# Patient Record
Sex: Male | Born: 1940 | Race: White | Hispanic: No | Marital: Married | Smoking: Never smoker
Health system: Southern US, Community
[De-identification: ages and names within clinical notes are randomized; demographics above are authoritative.]

## PROBLEM LIST (undated history)

## (undated) DIAGNOSIS — Z86718 Personal history of other venous thrombosis and embolism: Secondary | ICD-10-CM

## (undated) DIAGNOSIS — H3530 Unspecified macular degeneration: Secondary | ICD-10-CM

## (undated) DIAGNOSIS — D126 Benign neoplasm of colon, unspecified: Secondary | ICD-10-CM

## (undated) DIAGNOSIS — I10 Essential (primary) hypertension: Secondary | ICD-10-CM

## (undated) DIAGNOSIS — H269 Unspecified cataract: Secondary | ICD-10-CM

## (undated) DIAGNOSIS — E119 Type 2 diabetes mellitus without complications: Secondary | ICD-10-CM

## (undated) DIAGNOSIS — IMO0001 Reserved for inherently not codable concepts without codable children: Secondary | ICD-10-CM

## (undated) DIAGNOSIS — N182 Chronic kidney disease, stage 2 (mild): Secondary | ICD-10-CM

## (undated) DIAGNOSIS — Z85828 Personal history of other malignant neoplasm of skin: Secondary | ICD-10-CM

## (undated) DIAGNOSIS — M1990 Unspecified osteoarthritis, unspecified site: Secondary | ICD-10-CM

## (undated) DIAGNOSIS — E785 Hyperlipidemia, unspecified: Secondary | ICD-10-CM

## (undated) DIAGNOSIS — L97929 Non-pressure chronic ulcer of unspecified part of left lower leg with unspecified severity: Secondary | ICD-10-CM

## (undated) DIAGNOSIS — D689 Coagulation defect, unspecified: Secondary | ICD-10-CM

## (undated) DIAGNOSIS — Z87442 Personal history of urinary calculi: Secondary | ICD-10-CM

## (undated) DIAGNOSIS — C61 Malignant neoplasm of prostate: Secondary | ICD-10-CM

## (undated) DIAGNOSIS — Z9889 Other specified postprocedural states: Secondary | ICD-10-CM

## (undated) DIAGNOSIS — R55 Syncope and collapse: Secondary | ICD-10-CM

## (undated) DIAGNOSIS — R351 Nocturia: Secondary | ICD-10-CM

## (undated) DIAGNOSIS — I509 Heart failure, unspecified: Secondary | ICD-10-CM

## (undated) DIAGNOSIS — Z85820 Personal history of malignant melanoma of skin: Secondary | ICD-10-CM

## (undated) DIAGNOSIS — H9190 Unspecified hearing loss, unspecified ear: Secondary | ICD-10-CM

## (undated) DIAGNOSIS — C439 Malignant melanoma of skin, unspecified: Secondary | ICD-10-CM

## (undated) HISTORY — DX: Nocturia: R35.1

## (undated) HISTORY — DX: Unspecified cataract: H26.9

## (undated) HISTORY — PX: EYE SURGERY: SHX253

## (undated) HISTORY — DX: Essential (primary) hypertension: I10

## (undated) HISTORY — PX: TONSILLECTOMY: SUR1361

## (undated) HISTORY — PX: OTHER SURGICAL HISTORY: SHX169

## (undated) HISTORY — DX: Unspecified osteoarthritis, unspecified site: M19.90

## (undated) HISTORY — DX: Coagulation defect, unspecified: D68.9

## (undated) HISTORY — PX: PROSTATE BIOPSY: SHX241

## (undated) HISTORY — DX: Hyperlipidemia, unspecified: E78.5

## (undated) HISTORY — DX: Type 2 diabetes mellitus without complications: E11.9

## (undated) HISTORY — DX: Benign neoplasm of colon, unspecified: D12.6

## (undated) HISTORY — PX: POLYPECTOMY: SHX149

## (undated) HISTORY — PX: COLONOSCOPY: SHX174

## (undated) HISTORY — DX: Syncope and collapse: R55

## (undated) HISTORY — PX: JOINT REPLACEMENT: SHX530

## (undated) HISTORY — DX: Malignant melanoma of skin, unspecified: C43.9

---

## 1988-08-15 DIAGNOSIS — C439 Malignant melanoma of skin, unspecified: Secondary | ICD-10-CM

## 1988-08-15 HISTORY — PX: OTHER SURGICAL HISTORY: SHX169

## 1988-08-15 HISTORY — DX: Malignant melanoma of skin, unspecified: C43.9

## 2009-01-29 ENCOUNTER — Ambulatory Visit: Payer: Self-pay | Admitting: Internal Medicine

## 2009-02-17 ENCOUNTER — Ambulatory Visit: Payer: Self-pay | Admitting: Internal Medicine

## 2009-02-18 ENCOUNTER — Encounter: Payer: Self-pay | Admitting: Internal Medicine

## 2009-08-15 DIAGNOSIS — C4491 Basal cell carcinoma of skin, unspecified: Secondary | ICD-10-CM

## 2009-08-15 DIAGNOSIS — Z85828 Personal history of other malignant neoplasm of skin: Secondary | ICD-10-CM

## 2009-08-15 HISTORY — DX: Basal cell carcinoma of skin, unspecified: C44.91

## 2009-08-15 HISTORY — DX: Personal history of other malignant neoplasm of skin: Z85.828

## 2012-03-27 ENCOUNTER — Encounter: Payer: Self-pay | Admitting: Internal Medicine

## 2012-04-12 ENCOUNTER — Encounter: Payer: Self-pay | Admitting: Internal Medicine

## 2012-05-29 ENCOUNTER — Ambulatory Visit (AMBULATORY_SURGERY_CENTER): Payer: PRIVATE HEALTH INSURANCE | Admitting: *Deleted

## 2012-05-29 VITALS — Ht 67.0 in | Wt 245.0 lb

## 2012-05-29 DIAGNOSIS — Z1211 Encounter for screening for malignant neoplasm of colon: Secondary | ICD-10-CM

## 2012-05-29 MED ORDER — MOVIPREP 100 G PO SOLR: ORAL | Status: DC

## 2012-06-12 ENCOUNTER — Encounter: Payer: Self-pay | Admitting: Internal Medicine

## 2012-06-12 ENCOUNTER — Ambulatory Visit (AMBULATORY_SURGERY_CENTER): Payer: PRIVATE HEALTH INSURANCE | Admitting: Internal Medicine

## 2012-06-12 VITALS — BP 126/71 | HR 78 | Temp 97.9°F | Resp 16 | Ht 67.0 in | Wt 245.0 lb

## 2012-06-12 DIAGNOSIS — D126 Benign neoplasm of colon, unspecified: Secondary | ICD-10-CM

## 2012-06-12 DIAGNOSIS — Z1211 Encounter for screening for malignant neoplasm of colon: Secondary | ICD-10-CM

## 2012-06-12 DIAGNOSIS — Z86010 Personal history of colonic polyps: Secondary | ICD-10-CM

## 2012-06-12 MED ORDER — SODIUM CHLORIDE 0.9 % IV SOLN: 500.0000 mL | INTRAVENOUS | Status: DC

## 2012-06-12 NOTE — Progress Notes (Signed)
Patient did not experience any of the following events: a burn prior to discharge; a fall within the facility; wrong site/side/patient/procedure/implant event; or a hospital transfer or hospital admission upon discharge from the facility. (G8907) Patient did not have preoperative order for IV antibiotic SSI prophylaxis. (G8918)  

## 2012-06-12 NOTE — Patient Instructions (Signed)
YOU HAD AN ENDOSCOPIC PROCEDURE TODAY AT THE [facility]: Refer to the procedure report that was given to you for any specific questions about what was found during the examination.  If the procedure report does not answer your questions, please call your gastroenterologist to clarify.  If you requested that your care partner not be given the details of your procedure findings, then the procedure report has been included in a sealed envelope for you to review at your convenience later.  YOU SHOULD EXPECT: Some feelings of bloating in the abdomen. Passage of more gas than usual.  Walking can help get rid of the air that was put into your GI tract during the procedure and reduce the bloating. If you had a lower endoscopy (such as a colonoscopy or flexible sigmoidoscopy) you may notice spotting of blood in your stool or on the toilet paper. If you underwent a bowel prep for your procedure, then you may not have a normal bowel movement for a few days.  DIET: Your first meal following the procedure should be a light meal and then it is ok to progress to your normal diet.  A half-sandwich or bowl of soup is an example of a good first meal.  Heavy or fried foods are harder to digest and may make you feel nauseous or bloated.  Likewise meals heavy in dairy and vegetables can cause extra gas to form and this can also increase the bloating.  Drink plenty of fluids but you should avoid alcoholic beverages for 24 hours.  ACTIVITY: Your care partner should take you home directly after the procedure.  You should plan to take it easy, moving slowly for the rest of the day.  You can resume normal activity the day after the procedure however you should NOT DRIVE or use heavy machinery for 24 hours (because of the sedation medicines used during the test).    SYMPTOMS TO REPORT IMMEDIATELY: A gastroenterologist can be reached at any hour.  During normal business hours, 8:30 AM to 5:00 PM Monday through Friday,  call [phone].  After hours and on weekends, please call the GI answering service at [phone] who will take a message and have the physician on call contact you.   Following lower endoscopy (colonoscopy or flexible sigmoidoscopy):  Excessive amounts of blood in the stool  Significant tenderness or worsening of abdominal pains  Swelling of the abdomen that is new, acute  Fever of 100F or higher    FOLLOW UP: If any biopsies were taken you will be contacted by phone or by letter within the next 1-3 weeks.  Call your gastroenterologist if you have not heard about the biopsies in 3 weeks.  Our staff will call the home number listed on your records the next business day following your procedure to check on you and address any questions or concerns that you may have at that time regarding the information given to you following your procedure. This is a courtesy call and so if there is no answer at the home number and we have not heard from you through the emergency physician on call, we will assume that you have returned to your regular daily activities without incident.  SIGNATURES/CONFIDENTIALITY: You and/or your care partner have signed paperwork which will be entered into your electronic medical record.  These signatures attest to the fact that that the information above on your After Visit Summary has been reviewed and is understood.  Full responsibility of the confidentiality   of this discharge information lies with you and/or your care-partner.     

## 2012-06-12 NOTE — Op Note (Signed)
[place]  [address]. [place], [address]   COLONOSCOPY PROCEDURE REPORT  PATIENT: Ricardo Mayer, Ricardo Mayer  MR#: [id number] BIRTHDATE: 1941-04-19 , 71  yrs. 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 MEDICATIONS: MAC sedation, administered by CRNA and propofol (Diprivan) 150mg  IV  DESCRIPTION OF PROCEDURE:   After the risks benefits and alternatives of the procedure were thoroughly explained, informed consent was obtained.  A digital rectal exam revealed no abnormalities of the rectum.   The LB CF-[id number]  endoscope was introduced through the anus and advanced to the cecum, which was identified by both the appendix and ileocecal valve. No adverse events experienced.   The quality of the prep was good, using MoviPrep  The instrument was then slowly withdrawn as the colon was fully examined.      COLON FINDINGS: A diminutive polyp was found in the ascending colon. A polypectomy was performed with a cold snare.  The resection was complete and the polyp tissue was completely retrieved.   Mild diverticulosis was noted in the sigmoid colon.   The colon mucosa was otherwise normal.  Retroflexed views revealed internal hemorrhoids. The time to cecum=2 minutes 05 seconds.  Withdrawal time=9 minutes 37 seconds.  The scope was withdrawn and the procedure completed. COMPLICATIONS: There were no complications.  ENDOSCOPIC IMPRESSION: 1.   Diminutive polyp was found in the ascending colon; polypectomy was performed with a cold snare 2.   Mild diverticulosis was noted in the sigmoid colon 3.   The colon mucosa was otherwise normal  RECOMMENDATIONS: 1. Follow up colonoscopy in 5 years   eSigned:  Eustace Quail, MD 06/12/2012 9:46 AM   cc: [name], MD and The Patient   PATIENT NAME:  Ricardo Mayer, Ricardo Mayer MR#: [id number]

## 2012-06-13 ENCOUNTER — Telehealth: Payer: Self-pay

## 2012-06-13 NOTE — Telephone Encounter (Signed)
  Follow up Call-  Call back number 06/12/2012  Post procedure Call Back phone  # [phone]  Permission to leave phone message Yes     Patient questions:  Do you have a fever, pain , or abdominal swelling? no Pain Score  0 *  Have you tolerated food without any problems? yes  Have you been able to return to your normal activities? yes  Do you have any questions about your discharge instructions: Diet   no Medications  no Follow up visit  no  Do you have questions or concerns about your Care? no  Actions: * If pain score is 4 or above: No action needed, pain <4.  The pt was at work.  His wife said he did fine. Maw

## 2012-06-19 ENCOUNTER — Encounter: Payer: Self-pay | Admitting: Internal Medicine

## 2012-08-15 DIAGNOSIS — C61 Malignant neoplasm of prostate: Secondary | ICD-10-CM

## 2012-08-15 HISTORY — DX: Malignant neoplasm of prostate: C61

## 2012-11-12 ENCOUNTER — Other Ambulatory Visit: Payer: Self-pay | Admitting: Family Medicine

## 2012-12-18 ENCOUNTER — Other Ambulatory Visit: Payer: Self-pay | Admitting: *Deleted

## 2012-12-18 ENCOUNTER — Telehealth: Payer: Self-pay | Admitting: Family Medicine

## 2012-12-18 MED ORDER — AMLODIPINE BESYLATE-VALSARTAN 10-320 MG PO TABS: 1.0000 | ORAL_TABLET | Freq: Every day | ORAL | Status: DC

## 2012-12-19 NOTE — Telephone Encounter (Signed)
Please advise have you seen anything on this patient?

## 2012-12-19 NOTE — Telephone Encounter (Signed)
Done on 12-18-12

## 2012-12-25 ENCOUNTER — Encounter: Payer: Self-pay | Admitting: Radiation Oncology

## 2012-12-25 DIAGNOSIS — C61 Malignant neoplasm of prostate: Secondary | ICD-10-CM | POA: Insufficient documentation

## 2012-12-25 NOTE — Progress Notes (Signed)
GU Location of Tumor / Histology: prostate  If Prostate Cancer, Gleason Score is (3 + 3=6) and PSA is (5.04)  Patient presented 2010 with elevated PSA.  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  Past/Anticipated interventions by urology, if any: Follow up in 3 months with PSA  Past/Anticipated interventions by medical oncology, if any: None  Weight changes, if any: None noted  Bowel/Bladder complaints, if any: frequency, urgency, nocturia, gross hematuria   Nausea/Vomiting, if any: None noted  Pain issues, if any:  None noted  SAFETY ISSUES:  Prior radiation? NO  Pacemaker/ICD? NO  Possible current pregnancy? NO  Is the patient on methotrexate? NO  Current Complaints / other details:  72 year old male. Married. 3 sons. Immunologist. Prostate volume 54 cc. 2+ prostate size. NKDA.   November 2010 PSA 3.18 February 2012    PSA 4.15 November 2012     PSA 5.04

## 2012-12-26 ENCOUNTER — Ambulatory Visit
Admission: RE | Admit: 2012-12-26 | Discharge: 2012-12-26 | Disposition: A | Discharge status: Home / Self Care | Payer: PRIVATE HEALTH INSURANCE | Source: Ambulatory Visit | Attending: Radiation Oncology | Admitting: Radiation Oncology

## 2012-12-26 ENCOUNTER — Encounter: Payer: Self-pay | Admitting: Radiation Oncology

## 2012-12-26 VITALS — BP 132/73 | HR 78 | Temp 97.5°F | Resp 18 | Ht 67.0 in | Wt 254.8 lb

## 2012-12-26 DIAGNOSIS — C61 Malignant neoplasm of prostate: Secondary | ICD-10-CM

## 2012-12-26 DIAGNOSIS — I129 Hypertensive chronic kidney disease with stage 1 through stage 4 chronic kidney disease, or unspecified chronic kidney disease: Secondary | ICD-10-CM | POA: Insufficient documentation

## 2012-12-26 DIAGNOSIS — E785 Hyperlipidemia, unspecified: Secondary | ICD-10-CM | POA: Insufficient documentation

## 2012-12-26 DIAGNOSIS — Z800 Family history of malignant neoplasm of digestive organs: Secondary | ICD-10-CM | POA: Insufficient documentation

## 2012-12-26 DIAGNOSIS — Z79899 Other long term (current) drug therapy: Secondary | ICD-10-CM | POA: Insufficient documentation

## 2012-12-26 DIAGNOSIS — Z803 Family history of malignant neoplasm of breast: Secondary | ICD-10-CM | POA: Insufficient documentation

## 2012-12-26 DIAGNOSIS — N189 Chronic kidney disease, unspecified: Secondary | ICD-10-CM | POA: Insufficient documentation

## 2012-12-26 DIAGNOSIS — Z801 Family history of malignant neoplasm of trachea, bronchus and lung: Secondary | ICD-10-CM | POA: Insufficient documentation

## 2012-12-26 DIAGNOSIS — Z7982 Long term (current) use of aspirin: Secondary | ICD-10-CM | POA: Insufficient documentation

## 2012-12-26 HISTORY — DX: Malignant neoplasm of prostate: C61

## 2012-12-26 NOTE — Progress Notes (Signed)
Patient presents to the clinic today accompanied by his wife for consultation with Dr. Tammi Klippel to discuss the role of radiation therapy in the treatment of prostate cancer. Patient alert and oriented to person, place, and time. No distress noted. Steady gait noted. Pleasant affect noted. Patient reports on average he gets up three times during the night to void. Patient reports on average he voids every two hours. Patient denies urgency. Patient reports a strong urine stream. Patient reports that he is confident he empties his bladder completely while void. Patient denies hematuria. Patient denies burning with urination. Patient denies diarrhea or painful bowel movements. Patient denies nausea, vomiting, headache or dizziness. Patient denies unintentional weight loss. Patient denies night sweats. Reported all findings to Dr. Tammi Klippel.        Complete PATIENT MEASURE OF DISTRESS worksheet with a score of 2 submitted to social work.

## 2012-12-26 NOTE — Progress Notes (Signed)
Opened in error

## 2012-12-26 NOTE — Progress Notes (Signed)
See progress note under physician encounter. 

## 2012-12-28 ENCOUNTER — Encounter: Payer: Self-pay | Admitting: Radiation Oncology

## 2012-12-28 NOTE — Progress Notes (Signed)
Radiation Oncology         [phone] ________________________________  Initial outpatient Consultation  Name: Ricardo Mayer MRN: [id number]  Date: 12/26/2012  DOB: 22-Oct-1940  [id number], Elenore Rota, MD  Malka So, MD   REFERRING PHYSICIAN: Malka So, MD  DIAGNOSIS: 72 y.o. 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 gentleman.  He was noted to have an elevated PSA of 4.3 by his primary care physician, Dr. Laurance Flatten.  Accordingly, he was referred for evaluation in urology by Dr. Jeffie Pollock on 05/22/12,  digital rectal examination was performed at that time revealing a 2+ gland without distinct nodularity. The patient was setup for transrectal ultrasound with biopsies but developed a DVT. After a delay for anticoagulation, the patient proceeded to transrectal ultrasound with 12 biopsies of the prostate on 11/20/2012.  The prostate volume measured 54 cc.  Out of 12 core biopsies, 5 were positive.  The maximum Gleason score was 3+3, and this was seen in 60% of the right lateral mid, 20% of the right mid, 30% of the right lateral apex, 40% of the right apex, and 70% of the left lateral base.  The patient reviewed the biopsy results with his urologist and he has kindly been referred today for discussion of potential radiation treatment options.  PREVIOUS RADIATION THERAPY: No  PAST MEDICAL HISTORY:  has a past medical history of Hypertension; Hyperlipidemia; Adenomatous polyp of colon; Chronic renal insufficiency; Urgency of urination; Gross hematuria; Personal history of urinary calculi; Nocturia; Urinary frequency; Slowing of urinary stream; Splitting of urinary stream; Prostate cancer; Arthritis; Cataract; Cancer; and DVT (deep venous thrombosis) (2011).    PAST SURGICAL HISTORY: Past Surgical History  Procedure Laterality Date  . Kidney surgery Left 1960  . Kidney stones    .  Prostate biopsy    . Melanoma excision Right 1990    right arm    FAMILY HISTORY: family history includes Breast cancer in his mother; Cancer in his paternal grandfather and paternal uncle; Esophageal cancer in his brother; and Lung cancer in his father.  SOCIAL HISTORY:  reports that he has never smoked. He has never used smokeless tobacco. He reports that he does not drink alcohol or use illicit drugs.  ALLERGIES: Review of patient's allergies indicates no known allergies.  MEDICATIONS:  Current Outpatient Prescriptions  Medication Sig Dispense Refill  . amLODipine-valsartan (EXFORGE) 10-320 MG per tablet Take 1 tablet by mouth daily.  30 tablet  1  . aspirin 81 MG tablet Take 81 mg by mouth daily.      . cholecalciferol (VITAMIN D) 1000 UNITS tablet Take 1,000 Units by mouth daily.      . fenofibrate 160 MG tablet TAKE 1 TABLET BY MOUTH DAILY  30 tablet  4  . ONE TOUCH ULTRA TEST test strip       . furosemide (LASIX) 20 MG tablet Take 1 tablet by mouth Daily.      Marland Kitchen levofloxacin (LEVAQUIN) 500 MG tablet        No current facility-administered medications for this encounter.    REVIEW OF SYSTEMS:  A 15 point review of systems is documented in the electronic medical record. This was obtained by the nursing staff. However, I reviewed this with the patient to discuss relevant findings and make appropriate changes.  A comprehensive review of systems was negative..  The patient completed an  IPSS and IIEF questionnaire.  His IPSS score was 8 indicating moderate urinary outflow obstructive symptoms.  He indicated that his erectile function is able to complete sexual activity almost never.   PHYSICAL EXAM: This patient is in no acute distress.  He is alert and oriented.   height is 5\' 7"  (1.702 m) and weight is 254 lb 12.8 oz (115.577 kg). His oral temperature is 97.5 F (36.4 C). His blood pressure is 132/73 and his pulse is 78. His respiration is 18 and oxygen saturation is 100%.  He exhibits  no respiratory distress or labored breathing.  He appears neurologically intact.  His mood is pleasant.  His affect is appropriate.  Please note the digital rectal exam findings described above.    IMPRESSION: This gentleman is a 72 y.o. 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  His T-Stage, Gleason's Score, and PSA put him into the favorable risk group.  Accordingly he is eligible for a variety of potential treatment options including active surveillance, radical prostatectomy, intensity modulated radiotherapy, and prostate seed implant.  PLAN: Today I reviewed the findings and workup thus far.  We discussed the natural history of prostate cancer.  We reviewed the the implications of T-stage, Gleason's Score, and PSA on decision-making and outcomes in prostate cancer.  We discussed radiation treatment in the management of prostate cancer with regard to the logistics and delivery of external beam radiation treatment as well as the logistics and delivery of prostate brachytherapy.  We compared and contrasted each of these approaches and also compared these against prostatectomy.  The patient expressed interest in prostate brachytherapy.  I filled out a patient counseling form for him with relevant treatment diagrams and we retained a copy for our records.   The patient would like to proceed with prostate brachytherapy.  I will share my findings with Dr. Jeffie Pollock and move forward with scheduling the procedure.  The patient will have visiting grandchildren and a pregnant daughter during the early summer months. He would like to schedule his prostate seed implant to occur during the final week of July or first week of August.  I enjoyed meeting with him today, and will look forward to participating in the care of this very nice gentleman.   I spent 60 minutes face to face with the patient and more than 50% of that time was spent in counseling and/or  coordination of care.   ------------------------------------------------  Sheral Apley. Tammi Klippel, M.D.

## 2013-01-01 ENCOUNTER — Telehealth: Payer: Self-pay | Admitting: *Deleted

## 2013-01-01 NOTE — Telephone Encounter (Signed)
Called patient to ask a question, lvm for a return call

## 2013-01-03 ENCOUNTER — Telehealth: Payer: Self-pay | Admitting: *Deleted

## 2013-01-03 NOTE — Telephone Encounter (Signed)
Called patient to inform of implant date, lvm for a return call 

## 2013-02-05 ENCOUNTER — Ambulatory Visit (INDEPENDENT_AMBULATORY_CARE_PROVIDER_SITE_OTHER): Payer: PRIVATE HEALTH INSURANCE | Admitting: Physician Assistant

## 2013-02-05 VITALS — BP 123/66 | HR 80 | Temp 96.8°F | Ht 67.0 in | Wt 249.0 lb

## 2013-02-05 DIAGNOSIS — S81802A Unspecified open wound, left lower leg, initial encounter: Secondary | ICD-10-CM

## 2013-02-05 DIAGNOSIS — I872 Venous insufficiency (chronic) (peripheral): Secondary | ICD-10-CM

## 2013-02-05 DIAGNOSIS — S91009A Unspecified open wound, unspecified ankle, initial encounter: Secondary | ICD-10-CM

## 2013-02-05 DIAGNOSIS — S81002A Unspecified open wound, left knee, initial encounter: Secondary | ICD-10-CM

## 2013-02-05 DIAGNOSIS — S81809A Unspecified open wound, unspecified lower leg, initial encounter: Secondary | ICD-10-CM | POA: Diagnosis not present

## 2013-02-05 NOTE — Patient Instructions (Signed)
TAKE FLUID PILL AS DIRECTED TO PREVENT SWELLING. WEAR COMPRESSION STOCKING ON RIGHT LEG UNTIL DIRECTED OTHERWISE TO PREVENT SWELLING. ELEVATE LEGS. DO NOT REMOVE DRESSING UNTIL FOLLOW UP APPT ON TUES February 12, 2013.

## 2013-02-05 NOTE — Progress Notes (Signed)
  Subjective:    Patient ID: Ricardo Mayer, male    DOB: 09-22-1940, 72 y.o.   MRN: [id number]  HPI 72 Y/O male with hx of venous insufficiency presents with 2 open wounds to LLE below the knee x 1 wk. He states that the wounds have been draining and that he has been having increased swelling in BLE.     Review of Systems  Constitutional: Negative for fever, chills, diaphoresis, activity change, fatigue and unexpected weight change.  Cardiovascular: Positive for leg swelling (bilateral, more on LLE). Negative for chest pain and palpitations.  Skin: Positive for color change (erythema surrounding ulcerations on LLE, in addition to iron staiining on BLE) and wound (2 ulcerative lesions on LLE below the knee). Negative for pallor and rash.       Objective:   Physical Exam  Constitutional: He appears well-developed and well-nourished.  Skin: Lesion (ulcerative lesions with surrounding erythema, iron staining on BLE) noted. There is erythema.             Assessment & Plan:  An unna wrap was applied to LLE from knee down to cover ulcerations. Patient will RTC next Tuesday for dressing change. Instructed to take Lasix as directed on a daily basis until symptoms resolve. Also suggested wearing compression stockings to relieve swelling. Bacterial culture was taken. Await culture & sensitivity and treat accordingly.

## 2013-02-08 ENCOUNTER — Encounter: Payer: Self-pay | Admitting: Radiation Oncology

## 2013-02-08 ENCOUNTER — Ambulatory Visit (HOSPITAL_COMMUNITY)
Admission: RE | Admit: 2013-02-08 | Discharge: 2013-02-08 | Disposition: A | Discharge status: Home / Self Care | Payer: PRIVATE HEALTH INSURANCE | Source: Ambulatory Visit | Attending: Urology | Admitting: Urology

## 2013-02-08 ENCOUNTER — Ambulatory Visit
Admission: RE | Admit: 2013-02-08 | Discharge: 2013-02-08 | Disposition: A | Discharge status: Home / Self Care | Payer: PRIVATE HEALTH INSURANCE | Source: Ambulatory Visit | Attending: Radiation Oncology | Admitting: Radiation Oncology

## 2013-02-08 ENCOUNTER — Encounter (HOSPITAL_BASED_OUTPATIENT_CLINIC_OR_DEPARTMENT_OTHER)
Admission: RE | Admit: 2013-02-08 | Discharge: 2013-02-08 | Disposition: A | Discharge status: Home / Self Care | Payer: PRIVATE HEALTH INSURANCE | Source: Ambulatory Visit | Attending: Urology | Admitting: Urology

## 2013-02-08 DIAGNOSIS — I10 Essential (primary) hypertension: Secondary | ICD-10-CM | POA: Insufficient documentation

## 2013-02-08 DIAGNOSIS — C61 Malignant neoplasm of prostate: Secondary | ICD-10-CM | POA: Diagnosis present

## 2013-02-08 DIAGNOSIS — Z01818 Encounter for other preprocedural examination: Secondary | ICD-10-CM | POA: Insufficient documentation

## 2013-02-08 LAB — WOUND CULTURE

## 2013-02-08 NOTE — Progress Notes (Signed)
  Radiation Oncology         [phone] ________________________________  Name: ETHANJACOB POLIS MRN: [id number]  Date: 02/08/2013  DOB: [birth year]  SIMULATION AND TREATMENT PLANNING NOTE PUBIC ARCH STUDY  [id number], Elenore Rota, MD  Malka So, MD  DIAGNOSIS: 73 y.o. 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 He was brought to the radiation planning suite and placed supine on the CT couch. A 3-dimensional image study set was obtained in upload to the planning computer. There, on each axial slice, I contoured the prostate gland. Then, using three-dimensional radiation planning tools I reconstructed the prostate in view of the structures from the transperineal needle pathway to assess for possible pubic arch interference. In doing so, I did not appreciate any pubic arch interference. Also, the patient's prostate volume was estimated based on the drawn structure. The volume was 55 cc.  Given the pubic arch appearance and prostate volume, patient remains a good candidate to proceed with prostate seed implant. Today, he freely provided informed written consent to proceed.    PLAN: The patient will undergo prostate seed implant.   ________________________________  Sheral Apley. Tammi Klippel, M.D.

## 2013-02-09 ENCOUNTER — Telehealth: Payer: Self-pay | Admitting: Oncology

## 2013-02-09 NOTE — Telephone Encounter (Signed)
Called Ricardo Mayer to make an appointment to see Dr. Tammi Klippel to sign consent for his prostate seed implant.  Per [name], he can come in at 11:30 on Monday, 02/11/2013.  Appointment was make in epic for 11:30 as a nurse eval.

## 2013-02-11 ENCOUNTER — Ambulatory Visit
Admission: RE | Admit: 2013-02-11 | Discharge: 2013-02-11 | Disposition: A | Discharge status: Home / Self Care | Payer: PRIVATE HEALTH INSURANCE | Source: Ambulatory Visit | Attending: Radiation Oncology | Admitting: Radiation Oncology

## 2013-02-12 ENCOUNTER — Ambulatory Visit (INDEPENDENT_AMBULATORY_CARE_PROVIDER_SITE_OTHER): Payer: PRIVATE HEALTH INSURANCE | Admitting: Family Medicine

## 2013-02-12 ENCOUNTER — Encounter: Payer: Self-pay | Admitting: Family Medicine

## 2013-02-12 VITALS — BP 117/70 | HR 76 | Temp 97.0°F | Ht 67.0 in | Wt 249.2 lb

## 2013-02-12 DIAGNOSIS — L0390 Cellulitis, unspecified: Secondary | ICD-10-CM | POA: Diagnosis not present

## 2013-02-12 DIAGNOSIS — L0291 Cutaneous abscess, unspecified: Secondary | ICD-10-CM

## 2013-02-12 MED ORDER — DOXYCYCLINE HYCLATE 100 MG PO TABS: 100.0000 mg | ORAL_TABLET | Freq: Two times a day (BID) | ORAL | Status: DC

## 2013-02-12 NOTE — Progress Notes (Signed)
  Subjective:    Patient ID: Ricardo Mayer, male    DOB: 11/14/1940, 72 y.o.   MRN: [id number]  HPI This 72 y.o. male presents for evaluation of left lower extremity ulcers.  He has venous insufficiency and get venous stasis wounds on occasion.  He has been wearing Unnas boot to his left lower extremity for a week.   Review of Systems    No chest pain, SOB, HA, dizziness, vision change, N/V, diarrhea, constipation, dysuria, urinary urgency or frequency, myalgias, arthralgias or rash.  Objective:   Physical Exam  Vital signs noted  Well developed well nourished male.  HEENT - Head atraumatic Normocephalic                Throat - oropharanx wnl Respiratory - Lungs CTA bilateral Cardiac - RRR S1 and S2 without murmur  Extremities - Left lower extremiy with healed ulcer left medial ankle and yellow exudate scab over left anterior tibial region.       Assessment & Plan:  Cellulitis - Plan: doxycycline (VIBRA-TABS) 100 MG tablet, DC unnas boot left lower extremity. Edema - Continue to use lasix for swelling and use venous compression stockings.

## 2013-02-12 NOTE — Patient Instructions (Signed)
Venous Stasis and Chronic Venous Insufficiency  As people age, the veins located in their legs may weaken and stretch. When veins weaken and lose the ability to pump blood effectively, the condition is called chronic venous insufficiency (CVI) or venous stasis. Almost all veins return blood back to the heart. This happens by:   The force of the heart pumping fresh blood pushes blood back to the heart.   Blood flowing to the heart from the force of gravity.  In the deep veins of the legs, blood has to fight gravity and flow upstream back to the heart. Here, the leg muscles contract to pump blood back toward the heart. Vein walls are elastic, and many veins have small valves that only allow blood to flow in one direction. When leg muscles contract, they push inward against the elastic vein walls. This squeezes blood upward, opens the valves, and moves blood toward the heart. When leg muscles relax, the vein wall also relaxes and the valves inside the vein close to prevent blood from flowing backward. This method of pumping blood out of the legs is called the venous pump.  CAUSES   The venous pump works best while walking and leg muscles are contracting. But when a person sits or stands, blood pressure in leg veins can build. Deep veins are usually able to withstand short periods of inactivity, but long periods of inactivity (and increased pressure) can stretch, weaken, and damage vein walls. High blood pressure can also stretch and damage vein walls. The veins may no longer be able to pump blood back to the heart. Venous hypertension (high blood pressure inside veins) that lasts over time is a primary cause of CVI. CVI can also be caused by:    Deep vein thrombosis, a condition where a thrombus (blood clot) blocks blood flow in a vein.   Phlebitis, an inflammation of a superficial vein that causes a blood clot to form.  Other risk factors for CVI may include:    Heredity.   Obesity.   Pregnancy.    Sedentary lifestyle.   Smoking.   Jobs requiring long periods of standing or sitting in one place.   Age and gender:   Women in their 40's and 50's and men in their 70's are more prone to developing CVI.  SYMPTOMS   Symptoms of CVI may include:    Varicose veins.   Ulceration or skin breakdown.   Lipodermatosclerosis, a condition that affects the skin just above the ankle, usually on the inside surface. Over time the skin becomes brown, smooth, tight and often painful. Those with this condition have a high risk of developing skin ulcers.   Reddened or discolored skin on the leg.   Swelling.  DIAGNOSIS   Your caregiver can diagnose CVI after performing a careful medical history and physical examination. To confirm the diagnosis, the following tests may also be ordered:    Duplex ultrasound.   Plethysmography (tests blood flow).   Venograms (x-ray using a special dye).  TREATMENT  The goals of treatment for CVI are to restore a person to an active life and to minimize pain or disability. Typically, CVI does not pose a serious threat to life or limb, and with proper treatment most people with this condition can continue to lead active lives. In most cases, mild CVI can be treated on an outpatient basis with simple procedures. Treatment methods include:    Elastic compression socks.   Sclerotherapy, a procedure involving an injection of   a material that "dissolves" the damaged veins. Other veins in the network of blood vessels take over the function of the damaged veins.   Vein stripping (an older procedure less commonly used).   Laser Ablation surgery.   Valve repair.  HOME CARE INSTRUCTIONS    Elastic compression socks must be worn every day. They can help with symptoms and lower the chances of the problem getting worse, but they do not cure the problem.   Only take over-the-counter or prescription medicines for pain, discomfort, or fever as directed by your caregiver.    Your caregiver will review your other medications with you.  SEEK MEDICAL CARE IF:    You are confused about how to take your medications.   There is redness, swelling, or increasing pain in the affected area.   There is a red streak or line that extends up or down from the affected area.   There is a breakdown or loss of skin in the affected area, even if the breakdown is small.   You develop an unexplained oral temperature above 102 F (38.9 C).   There is an injury to the affected area.  SEEK IMMEDIATE MEDICAL CARE IF:    There is an injury and open wound to the affected area.   Pain is not adequately relieved with pain medication prescribed or becomes severe.   An oral temperature above 102 F (38.9 C) develops.   The foot/ankle below the affected area becomes suddenly numb or the area feels weak and hard to move.  MAKE SURE YOU:    Understand these instructions.   Will watch your condition.   Will get help right away if you are not doing well or get worse.  Document Released: 12/05/2006 Document Revised: 10/24/2011 Document Reviewed: 02/12/2007  ExitCare Patient Information 2014 [employer].

## 2013-02-21 ENCOUNTER — Other Ambulatory Visit: Payer: Self-pay | Admitting: Family Medicine

## 2013-02-22 ENCOUNTER — Other Ambulatory Visit: Payer: Self-pay | Admitting: Family Medicine

## 2013-03-05 ENCOUNTER — Encounter: Payer: Self-pay | Admitting: Family Medicine

## 2013-03-05 ENCOUNTER — Ambulatory Visit (INDEPENDENT_AMBULATORY_CARE_PROVIDER_SITE_OTHER): Payer: PRIVATE HEALTH INSURANCE | Admitting: Family Medicine

## 2013-03-05 VITALS — BP 124/78 | HR 79 | Temp 97.2°F | Ht 67.0 in | Wt 252.0 lb

## 2013-03-05 DIAGNOSIS — I10 Essential (primary) hypertension: Secondary | ICD-10-CM

## 2013-03-05 DIAGNOSIS — L97909 Non-pressure chronic ulcer of unspecified part of unspecified lower leg with unspecified severity: Secondary | ICD-10-CM | POA: Diagnosis not present

## 2013-03-05 DIAGNOSIS — I83009 Varicose veins of unspecified lower extremity with ulcer of unspecified site: Secondary | ICD-10-CM | POA: Diagnosis not present

## 2013-03-05 DIAGNOSIS — IMO0001 Reserved for inherently not codable concepts without codable children: Secondary | ICD-10-CM

## 2013-03-05 LAB — COMPREHENSIVE METABOLIC PANEL
CO2: 24 mEq/L (ref 19–32)
Calcium: 9.9 mg/dL (ref 8.4–10.5)
Glucose, Bld: 109 mg/dL — ABNORMAL HIGH (ref 70–99)
Sodium: 136 mEq/L (ref 135–145)
Total Bilirubin: 0.5 mg/dL (ref 0.3–1.2)
Total Protein: 6.4 g/dL (ref 6.0–8.3)

## 2013-03-05 LAB — LDL CHOLESTEROL, DIRECT: Direct LDL: 21 mg/dL

## 2013-03-05 NOTE — Progress Notes (Signed)
  Subjective:    Patient ID: Ricardo Mayer, male    DOB: March 13, 1941, 72 y.o.   MRN: [id number]  HPI Pt presents today for follow up of chronic venous ulcers.  Have been present for at least the last year since DVT in 2011.  No longer on coumading.  Not a diabetic to his knowledge.  No hx/o heart failure.  Was seen on 6/24 for sxs.  Unna boot placed.  Kept on fo 1 week and sxs improved.  Sxs recurred after unna boot was removed.  Mild weeping. No purulence.  No fevers or chills. No trauma.     Review of Systems  All other systems reviewed and are negative.       Objective:   Physical Exam  Constitutional:  Obese    HENT:  Head: Normocephalic and atraumatic.  Eyes: Conjunctivae are normal. Pupils are equal, round, and reactive to light.  Neck: Normal range of motion.  Cardiovascular: Normal rate and regular rhythm.   Pulmonary/Chest: Effort normal.  Abdominal: Soft.  Musculoskeletal:  1-2 + edema in  LLE  Mild tenderness  2+ peripheral pulses   Neurological: He is alert.  Skin: Rash noted.          Assessment & Plan:  Venous stasis ulcers, left - Plan: Vas Lab Arterial/Venous, POCT glycosylated hemoglobin (Hb A1C)  HTN (hypertension) - Plan: Comprehensive metabolic panel, TSH, LDL Cholesterol, Direct, POCT glycosylated hemoglobin (Hb A1C)  Fairly mild venous stasis ulcer.  Unna boot placed to affected LE PLan for wound care follow up Check ABIs Also check baseline blood work including A1c, CMET

## 2013-03-06 ENCOUNTER — Other Ambulatory Visit: Payer: Self-pay

## 2013-03-06 DIAGNOSIS — I872 Venous insufficiency (chronic) (peripheral): Secondary | ICD-10-CM

## 2013-03-06 LAB — TSH: TSH: 2.035 u[IU]/mL (ref 0.350–4.500)

## 2013-03-13 ENCOUNTER — Telehealth: Payer: Self-pay | Admitting: *Deleted

## 2013-03-13 ENCOUNTER — Encounter (HOSPITAL_BASED_OUTPATIENT_CLINIC_OR_DEPARTMENT_OTHER): Payer: Self-pay | Admitting: *Deleted

## 2013-03-13 NOTE — Telephone Encounter (Signed)
CALLED PATIENT TO REMIND OF LAB FOR 03-14-13, LVM FOR A RETURN CALL

## 2013-03-14 ENCOUNTER — Other Ambulatory Visit: Payer: Self-pay | Admitting: *Deleted

## 2013-03-14 ENCOUNTER — Encounter (HOSPITAL_BASED_OUTPATIENT_CLINIC_OR_DEPARTMENT_OTHER): Payer: Self-pay | Admitting: *Deleted

## 2013-03-14 ENCOUNTER — Other Ambulatory Visit (INDEPENDENT_AMBULATORY_CARE_PROVIDER_SITE_OTHER): Payer: Medicare Other

## 2013-03-14 DIAGNOSIS — Z1212 Encounter for screening for malignant neoplasm of rectum: Secondary | ICD-10-CM

## 2013-03-14 DIAGNOSIS — Z125 Encounter for screening for malignant neoplasm of prostate: Secondary | ICD-10-CM

## 2013-03-14 DIAGNOSIS — R972 Elevated prostate specific antigen [PSA]: Secondary | ICD-10-CM

## 2013-03-14 NOTE — Progress Notes (Addendum)
NPO AFTER MN. ARRIVES AT 0800. PT STATES LAB TO BE DONE AT [facility], 03-15-2013. CURRENT EKG AND CXR IN EPIC AND CHART. WILL DO FLEET ENEMA AM OF SURG.  PER PT , WHOM CALLED YESTERDAY, VISIT W/ PCP DR Laurance Flatten STARTED HIM ON DOXYCYLINE FOR CELLULITIS. HIS WAS IS IT OK TO TAKE. HE NOTIFIED DR Tammi Klippel AND DR [facility] OFFICE. I HEARD FROM DR [facility] OFFICE TODAY HE HAD SPOKEN W/ DR Laurance Flatten , OK TO PROCEED WITH SURG. I SPOKE W/ PT. TODAY INFORMED OF THIS AND THAT IT IS OK TO TAKE DOXYCYLINE.

## 2013-03-15 ENCOUNTER — Other Ambulatory Visit (INDEPENDENT_AMBULATORY_CARE_PROVIDER_SITE_OTHER): Payer: PRIVATE HEALTH INSURANCE

## 2013-03-15 DIAGNOSIS — Z01818 Encounter for other preprocedural examination: Secondary | ICD-10-CM

## 2013-03-15 LAB — PSA, TOTAL AND FREE: PSA: 4.9 ng/mL — ABNORMAL HIGH (ref 0.0–4.0)

## 2013-03-15 NOTE — Progress Notes (Signed)
Patient came in for lab work from [place]

## 2013-03-15 NOTE — Progress Notes (Signed)
Patient came in for labs only.

## 2013-03-16 LAB — CMP14+EGFR
AST: 17 IU/L (ref 0–40)
Albumin/Globulin Ratio: 2.3 (ref 1.1–2.5)
Albumin: 4.6 g/dL (ref 3.5–4.8)
Alkaline Phosphatase: 52 IU/L (ref 39–117)
BUN/Creatinine Ratio: 16 (ref 10–22)
BUN: 33 mg/dL — ABNORMAL HIGH (ref 8–27)
Creatinine, Ser: 2.01 mg/dL — ABNORMAL HIGH (ref 0.76–1.27)
GFR calc non Af Amer: 32 mL/min/{1.73_m2} — ABNORMAL LOW (ref >59)
Globulin, Total: 2 g/dL (ref 1.5–4.5)
Sodium: 140 mmol/L (ref 134–144)

## 2013-03-16 LAB — CBC WITH DIFFERENTIAL
Basos: 1 % (ref 0–3)
Eos: 1 % (ref 0–5)
HCT: 42.2 % (ref 37.5–51.0)
Hemoglobin: 14.7 g/dL (ref 12.6–17.7)
Lymphocytes Absolute: 2.1 10*3/uL (ref 0.7–3.1)
MCH: 29.7 pg (ref 26.6–33.0)
MCV: 85 fL (ref 79–97)
Monocytes Absolute: 0.6 10*3/uL (ref 0.1–0.9)
Neutrophils Absolute: 4.7 10*3/uL (ref 1.4–7.0)
Platelets: 179 10*3/uL (ref 150–379)
RBC: 4.95 x10E6/uL (ref 4.14–5.80)

## 2013-03-16 LAB — PROTIME-INR: Prothrombin Time: 10.6 s (ref 9.1–12.0)

## 2013-03-18 ENCOUNTER — Encounter: Payer: Self-pay | Admitting: Family Medicine

## 2013-03-18 ENCOUNTER — Ambulatory Visit (INDEPENDENT_AMBULATORY_CARE_PROVIDER_SITE_OTHER): Payer: PRIVATE HEALTH INSURANCE | Admitting: Family Medicine

## 2013-03-18 VITALS — BP 129/77 | HR 72 | Temp 97.3°F | Ht 66.0 in | Wt 250.0 lb

## 2013-03-18 DIAGNOSIS — N182 Chronic kidney disease, stage 2 (mild): Secondary | ICD-10-CM | POA: Diagnosis not present

## 2013-03-18 DIAGNOSIS — L02419 Cutaneous abscess of limb, unspecified: Secondary | ICD-10-CM

## 2013-03-18 DIAGNOSIS — L03116 Cellulitis of left lower limb: Secondary | ICD-10-CM

## 2013-03-18 DIAGNOSIS — I10 Essential (primary) hypertension: Secondary | ICD-10-CM | POA: Diagnosis not present

## 2013-03-18 DIAGNOSIS — L03119 Cellulitis of unspecified part of limb: Secondary | ICD-10-CM | POA: Diagnosis not present

## 2013-03-18 MED ORDER — DOXYCYCLINE HYCLATE 100 MG PO TABS: 100.0000 mg | ORAL_TABLET | Freq: Every day | ORAL | Status: DC

## 2013-03-18 NOTE — Patient Instructions (Addendum)
Keep leg elevated as much as possible Wear support hose daily and use Betadine wet-to-dry dressings to lesions on leg Take antibiotic as directed Keep appointment with nephrology Keep appointment with wound clinic Keep appointment as planned with Dr. Tammi Klippel We are trying to arrange for venous Dopplers and arterial Doppler with vascular and vein specialist

## 2013-03-18 NOTE — Progress Notes (Signed)
  Subjective:    Patient ID: Ricardo Mayer, male    DOB: 07/02/1941, 72 y.o.   MRN: [id number]  HPI Patient comes in to see me today for followup of some venous stasis ulcers. He has a recent diagnosis of prostate cancer with seed implants. He has a history of hypertension and CKG with a recent creatinine of 2.01.   Review of Systems  Constitutional: Negative for fever and fatigue.  HENT: Negative.   Eyes: Negative.   Respiratory: Negative.   Cardiovascular: Positive for leg swelling.  Gastrointestinal: Negative.   Endocrine: Negative.   Genitourinary: Negative.   Musculoskeletal: Negative.   Skin: Positive for wound (L lower leg ulcers).  Allergic/Immunologic: Negative.   Neurological: Negative.   Psychiatric/Behavioral: Positive for sleep disturbance (due to leg itching).       Objective:   Physical Exam  Vitals reviewed. Constitutional: He is oriented to person, place, and time. He appears well-developed and well-nourished. No distress.  HENT:  Head: Normocephalic.  Eyes: Conjunctivae are normal. No scleral icterus.  Neck: Normal range of motion.  Musculoskeletal: Normal range of motion. He exhibits tenderness. He exhibits no edema.  Neurological: He is alert and oriented to person, place, and time. He has normal reflexes.  Skin: Skin is warm. There is erythema.  2 areas of skin breakdown on the left lower leg posteriorly and anterior  Psychiatric: He has a normal mood and affect. His behavior is normal. Judgment and thought content normal.          Assessment & Plan:  Cellulitis of left lower leg - Plan: doxycycline (VIBRA-TABS) 100 MG tablet  HTN (hypertension)  Chronic kidney disease, stage 2 (mild)  Patient Instructions  Keep leg elevated as much as possible Wear support hose daily and use Betadine wet-to-dry dressings to lesions on leg Take antibiotic as directed Keep appointment with nephrology Keep appointment with wound clinic Keep appointment as  planned with Dr. Tammi Klippel We are trying to arrange for venous Dopplers and arterial Doppler with vascular and vein specialist    Arrie Senate MD

## 2013-03-19 ENCOUNTER — Other Ambulatory Visit: Payer: Self-pay | Admitting: *Deleted

## 2013-03-19 ENCOUNTER — Encounter (INDEPENDENT_AMBULATORY_CARE_PROVIDER_SITE_OTHER): Payer: PRIVATE HEALTH INSURANCE | Admitting: *Deleted

## 2013-03-19 ENCOUNTER — Ambulatory Visit: Payer: PRIVATE HEALTH INSURANCE | Admitting: Family Medicine

## 2013-03-19 DIAGNOSIS — L97909 Non-pressure chronic ulcer of unspecified part of unspecified lower leg with unspecified severity: Secondary | ICD-10-CM

## 2013-03-19 DIAGNOSIS — M7989 Other specified soft tissue disorders: Secondary | ICD-10-CM

## 2013-03-19 DIAGNOSIS — L97919 Non-pressure chronic ulcer of unspecified part of right lower leg with unspecified severity: Secondary | ICD-10-CM | POA: Diagnosis not present

## 2013-03-19 DIAGNOSIS — I83219 Varicose veins of right lower extremity with both ulcer of unspecified site and inflammation: Secondary | ICD-10-CM

## 2013-03-20 ENCOUNTER — Telehealth: Payer: Self-pay | Admitting: Family Medicine

## 2013-03-20 ENCOUNTER — Telehealth: Payer: Self-pay | Admitting: *Deleted

## 2013-03-20 ENCOUNTER — Encounter (HOSPITAL_BASED_OUTPATIENT_CLINIC_OR_DEPARTMENT_OTHER): Payer: Self-pay | Admitting: *Deleted

## 2013-03-20 DIAGNOSIS — C61 Malignant neoplasm of prostate: Secondary | ICD-10-CM | POA: Diagnosis not present

## 2013-03-20 NOTE — Telephone Encounter (Signed)
Called patient to remind of procedure for 03-21-13, lvm for a return call

## 2013-03-20 NOTE — H&P (Signed)
ctive Problems Problems  1. 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 Hypogonadism 257.2 3. Prostate Cancer 185 4. Prostate Hard Area Or Nodule 600.10 5. PSA,Elevated 790.93 6. 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  He was biopsied for an elevated PSA of 5.04 and bilateral apical induration.    His biopsy showed Gleason 6 T2b Nx Mx prostate cancer with 4/6 cores positive on the right in the mid and apical area with 20-60% involvement and a single core at the left base with 70% involvement.   His prostate volume is 54cc and his PSAD is about 0.095.   His IPSS is 4 and his SHIM is 7.  A Kattan nomogram predicts a 67% chance of OCD, 23% chance of ECE, 2% chance of ECE and a 2.3% chance of LNI.   Past Medical History Problems  1. History of  Arthritis V13.4 2. History of  Chronic Renal Insufficiency 585.9 3. History of  Diabetes Mellitus 250.00 4. History of  Feelings Of Urinary Urgency 788.63 5. History of  Gross Hematuria 599.71 6. History of  Hypertension 401.9 7. History of  Nephrolithiasis V13.01 8. History of  Nocturia 788.43 9. History of  Urinary Frequency 788.41 10. History of  Urinary Stream Is Smaller 788.62 11. History of  Urinary Stream Starts And Stops 788.61  Surgical History Problems  1. History of  Kidney Surgery  Current Meds 1. Aspirin 81 MG Oral Tablet; Therapy: (Recorded:08Oct2013) to 2. Exforge 10-160 MG Oral Tablet; Therapy: (Recorded:08Oct2013) to 3. Fenofibrate 160 MG Oral Tablet; Therapy: (Recorded:08Oct2013) to 4. Furosemide 20 MG Oral Tablet; Therapy: (Recorded:08Oct2013) to 5. Levofloxacin 500 MG Oral Tablet; Take one tablet daily starting day before procedure; Therapy:  01Apr2014 to (Evaluate:04Apr2014)  Requested for: 01Apr2014; Last Rx:01Apr2014 6. Vitamin D 1000 UNIT Oral Capsule; Therapy: (Recorded:02Nov2010) to  Allergies Medication  1. No Known Drug Allergies  Family  History Problems  1. Maternal history of  Breast Cancer V16.3 2. Family history of  Death In The Family Father 51 Lung cancer 3. Family history of  Death In The Family Mother 79 breast cancer 4. Maternal history of  Family Health Status Number Of Children 3 sons 5. Paternal history of  Lung Cancer V16.1 6. Paternal history of  Prostate Cancer V16.42 7. Paternal uncle's history of  Prostate Cancer V16.42  Social History Problems  1. Caffeine Use 1 a day 2. 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 History of  Alcohol Use 5. History of  Tobacco Use  Assessment Assessed  1. Prostate Cancer 185   T2b Nx Mx Gleason 6 low risk prostate cancer with multiple cores on the right and a single core on the left.   Plan Prostate Cancer (185)  1. Follow-up Month x 3 Office  Follow-up  Requested for: 24Apr2014 2. PSA  Requested for: 24Apr2014 3. Radiation Oncology Referral Referral  Referral  Requested for: 24Apr2014   I discussed the available treatment options including active surveillance, radical surgery, EXRT and brachytherapy.   I gave him my prostate cancer handout with the details of each option and the associated risks and will not recapitulate that here.  He is interested in surveillance but I am concerned about the more bulky nature of his disease and explained my concern that he could have more aggressive prostate cancer than the biopsy might suggest.   I recommended that should he want to consider surveillance, we should get  a genomic test such as Prolaris to better assess risk and I would recommend a repeat biopsy possibly with MRI prior to the study in about 6 months even if there is no evidence of clinical progression.  I don't think he is the best candidate for surgical therapy with his comorbidities but do feel he is a good candidate for EXRT or seeds.   I also discussed cryotherapy which is a candidate for.  I am going to get him set  up to see Dr. Tammi Klippel for consideration of the radiation options and will tentatively schedule a f/u in 3 months with a PSA and exam should he want to pursue surveillance and also because, should he choose treatment, he may want to wait until the summer.   I will order a Prolaris if he wants to follow an AS protocol.

## 2013-03-21 ENCOUNTER — Ambulatory Visit (HOSPITAL_BASED_OUTPATIENT_CLINIC_OR_DEPARTMENT_OTHER)
Admission: RE | Admit: 2013-03-21 | Discharge: 2013-03-21 | Disposition: A | Discharge status: Home / Self Care | Payer: PRIVATE HEALTH INSURANCE | Source: Ambulatory Visit | Attending: Urology | Admitting: Urology

## 2013-03-21 ENCOUNTER — Encounter (HOSPITAL_BASED_OUTPATIENT_CLINIC_OR_DEPARTMENT_OTHER): Payer: Self-pay | Admitting: Certified Registered"

## 2013-03-21 ENCOUNTER — Ambulatory Visit (HOSPITAL_BASED_OUTPATIENT_CLINIC_OR_DEPARTMENT_OTHER): Payer: PRIVATE HEALTH INSURANCE | Admitting: Certified Registered"

## 2013-03-21 ENCOUNTER — Encounter (HOSPITAL_BASED_OUTPATIENT_CLINIC_OR_DEPARTMENT_OTHER)
Admission: RE | Disposition: A | Discharge status: Home / Self Care | Payer: Self-pay | Source: Ambulatory Visit | Attending: Urology

## 2013-03-21 ENCOUNTER — Ambulatory Visit (HOSPITAL_COMMUNITY): Payer: PRIVATE HEALTH INSURANCE

## 2013-03-21 ENCOUNTER — Encounter (HOSPITAL_BASED_OUTPATIENT_CLINIC_OR_DEPARTMENT_OTHER): Payer: Self-pay

## 2013-03-21 DIAGNOSIS — I129 Hypertensive chronic kidney disease with stage 1 through stage 4 chronic kidney disease, or unspecified chronic kidney disease: Secondary | ICD-10-CM | POA: Insufficient documentation

## 2013-03-21 DIAGNOSIS — N189 Chronic kidney disease, unspecified: Secondary | ICD-10-CM | POA: Diagnosis not present

## 2013-03-21 DIAGNOSIS — Z7982 Long term (current) use of aspirin: Secondary | ICD-10-CM | POA: Insufficient documentation

## 2013-03-21 DIAGNOSIS — E291 Testicular hypofunction: Secondary | ICD-10-CM | POA: Diagnosis not present

## 2013-03-21 DIAGNOSIS — N402 Nodular prostate without lower urinary tract symptoms: Secondary | ICD-10-CM | POA: Insufficient documentation

## 2013-03-21 DIAGNOSIS — R972 Elevated prostate specific antigen [PSA]: Secondary | ICD-10-CM | POA: Insufficient documentation

## 2013-03-21 DIAGNOSIS — Z79899 Other long term (current) drug therapy: Secondary | ICD-10-CM | POA: Diagnosis not present

## 2013-03-21 DIAGNOSIS — E119 Type 2 diabetes mellitus without complications: Secondary | ICD-10-CM | POA: Diagnosis not present

## 2013-03-21 DIAGNOSIS — C61 Malignant neoplasm of prostate: Secondary | ICD-10-CM | POA: Diagnosis not present

## 2013-03-21 DIAGNOSIS — N35919 Unspecified urethral stricture, male, unspecified site: Secondary | ICD-10-CM | POA: Diagnosis not present

## 2013-03-21 DIAGNOSIS — N400 Enlarged prostate without lower urinary tract symptoms: Secondary | ICD-10-CM | POA: Diagnosis not present

## 2013-03-21 HISTORY — PX: RADIOACTIVE SEED IMPLANT: SHX5150

## 2013-03-21 HISTORY — DX: Non-pressure chronic ulcer of unspecified part of left lower leg with unspecified severity: L97.929

## 2013-03-21 HISTORY — DX: Personal history of urinary calculi: Z87.442

## 2013-03-21 HISTORY — DX: Personal history of other venous thrombosis and embolism: Z86.718

## 2013-03-21 HISTORY — DX: Personal history of malignant melanoma of skin: Z85.820

## 2013-03-21 HISTORY — DX: Chronic kidney disease, stage 2 (mild): N18.2

## 2013-03-21 HISTORY — DX: Other specified postprocedural states: Z98.890

## 2013-03-21 HISTORY — DX: Personal history of other malignant neoplasm of skin: Z85.828

## 2013-03-21 SURGERY — INSERTION, RADIATION SOURCE, PROSTATE
Anesthesia: General | Site: Prostate | Wound class: Clean Contaminated

## 2013-03-21 MED ORDER — LACTATED RINGERS IV SOLN
INTRAVENOUS | Status: DC
  Filled 2013-03-21: qty 1000

## 2013-03-21 MED ORDER — LACTATED RINGERS IV SOLN
INTRAVENOUS | Status: DC
  Administered 2013-03-21 (×2): via INTRAVENOUS
  Filled 2013-03-21: qty 1000

## 2013-03-21 MED ORDER — SODIUM CHLORIDE 0.9 % IJ SOLN
3.0000 mL | Freq: Two times a day (BID) | INTRAMUSCULAR | Status: DC
  Filled 2013-03-21: qty 3

## 2013-03-21 MED ORDER — STERILE WATER FOR IRRIGATION IR SOLN
Status: DC | PRN
  Administered 2013-03-21: 13 mL

## 2013-03-21 MED ORDER — FENTANYL CITRATE 0.05 MG/ML IJ SOLN
25.0000 ug | INTRAMUSCULAR | Status: DC | PRN
  Filled 2013-03-21: qty 1

## 2013-03-21 MED ORDER — ACETAMINOPHEN 650 MG RE SUPP
650.0000 mg | RECTAL | Status: DC | PRN
  Filled 2013-03-21: qty 1

## 2013-03-21 MED ORDER — PROMETHAZINE HCL 25 MG/ML IJ SOLN
6.2500 mg | INTRAMUSCULAR | Status: DC | PRN
  Filled 2013-03-21: qty 1

## 2013-03-21 MED ORDER — IOHEXOL 350 MG/ML SOLN
INTRAVENOUS | Status: DC | PRN
  Administered 2013-03-21: 7 mL

## 2013-03-21 MED ORDER — FENTANYL CITRATE 0.05 MG/ML IJ SOLN
INTRAMUSCULAR | Status: DC | PRN
  Administered 2013-03-21 (×2): 25 ug via INTRAVENOUS
  Administered 2013-03-21 (×2): 50 ug via INTRAVENOUS
  Administered 2013-03-21 (×2): 25 ug via INTRAVENOUS

## 2013-03-21 MED ORDER — ONDANSETRON HCL 4 MG/2ML IJ SOLN
INTRAMUSCULAR | Status: DC | PRN
  Administered 2013-03-21: 4 mg via INTRAVENOUS

## 2013-03-21 MED ORDER — PROPOFOL 10 MG/ML IV BOLUS
INTRAVENOUS | Status: DC | PRN
  Administered 2013-03-21: 180 mg via INTRAVENOUS

## 2013-03-21 MED ORDER — EPHEDRINE SULFATE 50 MG/ML IJ SOLN
INTRAMUSCULAR | Status: DC | PRN
  Administered 2013-03-21: 10 mg via INTRAVENOUS

## 2013-03-21 MED ORDER — SODIUM CHLORIDE 0.9 % IJ SOLN
3.0000 mL | INTRAMUSCULAR | Status: DC | PRN
  Filled 2013-03-21: qty 3

## 2013-03-21 MED ORDER — CIPROFLOXACIN IN D5W 400 MG/200ML IV SOLN
400.0000 mg | INTRAVENOUS | Status: AC
  Administered 2013-03-21: 400 mg via INTRAVENOUS
  Filled 2013-03-21: qty 200

## 2013-03-21 MED ORDER — DEXAMETHASONE SODIUM PHOSPHATE 4 MG/ML IJ SOLN
INTRAMUSCULAR | Status: DC | PRN
  Administered 2013-03-21: 8 mg via INTRAVENOUS

## 2013-03-21 MED ORDER — ACETAMINOPHEN 325 MG PO TABS
650.0000 mg | ORAL_TABLET | ORAL | Status: DC | PRN
  Filled 2013-03-21: qty 2

## 2013-03-21 MED ORDER — STERILE WATER FOR IRRIGATION IR SOLN
Status: DC | PRN
  Administered 2013-03-21: 1 via INTRAVESICAL

## 2013-03-21 MED ORDER — FLEET ENEMA 7-19 GM/118ML RE ENEM
1.0000 | ENEMA | Freq: Once | RECTAL | Status: DC
  Filled 2013-03-21: qty 1

## 2013-03-21 MED ORDER — ONDANSETRON HCL 4 MG/2ML IJ SOLN
4.0000 mg | Freq: Four times a day (QID) | INTRAMUSCULAR | Status: DC | PRN
  Filled 2013-03-21: qty 2

## 2013-03-21 MED ORDER — OXYCODONE HCL 5 MG PO TABS
5.0000 mg | ORAL_TABLET | ORAL | Status: DC | PRN
  Filled 2013-03-21: qty 2

## 2013-03-21 MED ORDER — HYDROCODONE-ACETAMINOPHEN 5-325 MG PO TABS: 1.0000 | ORAL_TABLET | Freq: Four times a day (QID) | ORAL | Status: DC | PRN

## 2013-03-21 MED ORDER — LIDOCAINE HCL (CARDIAC) 20 MG/ML IV SOLN
INTRAVENOUS | Status: DC | PRN
  Administered 2013-03-21: 80 mg via INTRAVENOUS

## 2013-03-21 MED ORDER — SODIUM CHLORIDE 0.9 % IV SOLN
250.0000 mL | INTRAVENOUS | Status: DC | PRN
  Filled 2013-03-21: qty 250

## 2013-03-21 SURGICAL SUPPLY — 25 items
BAG URINE DRAINAGE (UROLOGICAL SUPPLIES) ×2 IMPLANT
BLADE SURG ROTATE 9660 (MISCELLANEOUS) ×2 IMPLANT
CATH FOLEY 2WAY SLVR  5CC 16FR (CATHETERS) ×2
CATH FOLEY 2WAY SLVR 5CC 16FR (CATHETERS) ×2 IMPLANT
CATH ROBINSON RED A/P 20FR (CATHETERS) ×2 IMPLANT
CLOTH BEACON ORANGE TIMEOUT ST (SAFETY) ×2 IMPLANT
COVER MAYO STAND STRL (DRAPES) ×2 IMPLANT
COVER TABLE BACK 60X90 (DRAPES) ×2 IMPLANT
DRSG TEGADERM 4X4.75 (GAUZE/BANDAGES/DRESSINGS) ×2 IMPLANT
DRSG TEGADERM 8X12 (GAUZE/BANDAGES/DRESSINGS) ×3 IMPLANT
GAUZE SPONGE 4X4 12PLY STRL LF (GAUZE/BANDAGES/DRESSINGS) ×1 IMPLANT
GLOVE BIO SURGEON STRL SZ7.5 (GLOVE) ×4 IMPLANT
GLOVE ECLIPSE 8.0 STRL XLNG CF (GLOVE) ×4 IMPLANT
GLOVE SURG SS PI 8.0 STRL IVOR (GLOVE) ×4 IMPLANT
GOWN PREVENTION PLUS LG XLONG (DISPOSABLE) ×2 IMPLANT
GOWN STRL REIN XL XLG (GOWN DISPOSABLE) ×2 IMPLANT
HOLDER FOLEY CATH W/STRAP (MISCELLANEOUS) ×2 IMPLANT
Iimplant seeds (Urological Implant) ×1 IMPLANT
PACK CYSTOSCOPY (CUSTOM PROCEDURE TRAY) ×2 IMPLANT
PAD PREP 24X48 CUFFED NSTRL (MISCELLANEOUS) ×1 IMPLANT
PLUG CATH AND CAP STER (CATHETERS) IMPLANT
SYRINGE 10CC LL (SYRINGE) ×2 IMPLANT
UNDERPAD 30X30 INCONTINENT (UNDERPADS AND DIAPERS) ×3 IMPLANT
WATER STERILE IRR 3000ML UROMA (IV SOLUTION) ×2 IMPLANT
WATER STERILE IRR 500ML POUR (IV SOLUTION) ×2 IMPLANT

## 2013-03-21 NOTE — Brief Op Note (Signed)
03/21/2013  1:53 PM  PATIENT:  Farrell Ours Keegan  72 y.o. male  PRE-OPERATIVE DIAGNOSIS:  PROSTATE CANCER  POST-OPERATIVE DIAGNOSIS:  PROSTATE CANCER  PROCEDURE:  Procedure(s) with comments: RADIOACTIVE SEED IMPLANT (N/A) - 87 seeds implanted    SURGEON:  Surgeon(s) and Role:    * Malka So, MD - Primary    * Lora Paula, MD - Assisting  PHYSICIAN ASSISTANT:   ASSISTANTS: none   ANESTHESIA:   general  EBL:  Total I/O In: 1940 [P.O.:240; I.V.:1700] Out: -   BLOOD ADMINISTERED:none  DRAINS: none   LOCAL MEDICATIONS USED:  NONE  SPECIMEN:  No Specimen  DISPOSITION OF SPECIMEN:  N/A  COUNTS:  YES  TOURNIQUET:  * No tourniquets in log *  DICTATION: .Other Dictation: Dictation Number [id number]  PLAN OF CARE: Discharge to home after PACU  PATIENT DISPOSITION:  PACU - hemodynamically stable.   Delay start of Pharmacological VTE agent (>24hrs) due to surgical blood loss or risk of bleeding: no

## 2013-03-21 NOTE — Anesthesia Preprocedure Evaluation (Signed)
Anesthesia Evaluation  Patient identified by MRN, date of birth, ID band Patient awake    Reviewed: Allergy & Precautions, H&P , NPO status , Patient's Chart, lab work & pertinent test results  Airway Mallampati: III TM Distance: >3 FB Neck ROM: Full    Dental  (+) Teeth Intact and Dental Advisory Given   Pulmonary neg pulmonary ROS,  breath sounds clear to auscultation  Pulmonary exam normal       Cardiovascular hypertension, Pt. on medications DVT Rhythm:Regular Rate:Normal     Neuro/Psych negative neurological ROS  negative psych ROS   GI/Hepatic negative GI ROS, Neg liver ROS,   Endo/Other  negative endocrine ROSdiabetes (Borderline)Morbid obesity  Renal/GU Renal InsufficiencyRenal disease   Prostate CA negative genitourinary   Musculoskeletal negative musculoskeletal ROS (+)   Abdominal (+) + obese,   Peds  Hematology negative hematology ROS (+)   Anesthesia Other Findings   Reproductive/Obstetrics                           Anesthesia Physical Anesthesia Plan  ASA: III  Anesthesia Plan: General   Post-op Pain Management:    Induction: Intravenous  Airway Management Planned: LMA  Additional Equipment:   Intra-op Plan:   Post-operative Plan: Extubation in OR  Informed Consent: I have reviewed the patients History and Physical, chart, labs and discussed the procedure including the risks, benefits and alternatives for the proposed anesthesia with the patient or authorized representative who has indicated his/her understanding and acceptance.   Dental advisory given  Plan Discussed with: CRNA  Anesthesia Plan Comments:         Anesthesia Quick Evaluation

## 2013-03-21 NOTE — Anesthesia Procedure Notes (Signed)
Procedure Name: LMA Insertion Date/Time: 03/21/2013 9:52 AM Performed by: Denna Haggard D Pre-anesthesia Checklist: Patient identified, Emergency Drugs available, Suction available and Patient being monitored Patient Re-evaluated:Patient Re-evaluated prior to inductionOxygen Delivery Method: Circle System Utilized Preoxygenation: Pre-oxygenation with 100% oxygen Intubation Type: IV induction Ventilation: Mask ventilation without difficulty LMA: LMA inserted LMA Size: 4.0 Number of attempts: 1 Airway Equipment and Method: bite block Placement Confirmation: positive ETCO2 Tube secured with: Tape Dental Injury: Teeth and Oropharynx as per pre-operative assessment

## 2013-03-21 NOTE — Transfer of Care (Signed)
Immediate Anesthesia Transfer of Care Note  Patient: Ricardo Mayer  Procedure(s) Performed: Procedure(s) (LRB): RADIOACTIVE SEED IMPLANT (N/A)  Patient Location: PACU  Anesthesia Type: General  Level of Consciousness: awake, oriented, sedated and patient cooperative  Airway & Oxygen Therapy: Patient Spontanous Breathing and Patient connected to face mask oxygen  Post-op Assessment: Report given to PACU RN and Post -op Vital signs reviewed and stable  Post vital signs: Reviewed and stable  Complications: No apparent anesthesia complications

## 2013-03-21 NOTE — Interval H&P Note (Signed)
History and Physical Interval Note:  He has had some issues with recurrent chronic left LE venous stasis disease with possible cellulitis and ulceration that has been managed with wraps and most recently doxycycline.   He is otherwise without complaints.  PE:  Gen WD, WN, WM in NAD,  Lungs: CTA, CV: RRR, Ext right LE with mild venous stasis changes, left LE with moderate to severe venous stasis changes with some erythema and a crusty ulcer on the medial posterior ankle.  There is some tenderness.    Will proceed with procedure since the venous stasis disease is a chronic issue.   03/21/2013 8:32 AM  Bonne Dolores  has presented today for surgery, with the diagnosis of PROSTATE CANCER  The various methods of treatment have been discussed with the patient and family. After consideration of risks, benefits and other options for treatment, the patient has consented to  Procedure(s): RADIOACTIVE SEED IMPLANT (N/A) as a surgical intervention .  The patient's history has been reviewed, patient examined, no change in status, stable for surgery.  I have reviewed the patient's chart and labs.  Questions were answered to the patient's satisfaction.     Nahal Wanless J

## 2013-03-21 NOTE — Anesthesia Postprocedure Evaluation (Signed)
  Anesthesia Post-op Note  Patient: Ricardo Mayer  Procedure(s) Performed: Procedure(s) (LRB): RADIOACTIVE SEED IMPLANT (N/A)  Patient Location: PACU  Anesthesia Type: General  Level of Consciousness: awake and alert   Airway and Oxygen Therapy: Patient Spontanous Breathing  Post-op Pain: mild  Post-op Assessment: Post-op Vital signs reviewed, Patient's Cardiovascular Status Stable, Respiratory Function Stable, Patent Airway and No signs of Nausea or vomiting  Last Vitals:  Filed Vitals:   03/21/13 1153  BP:   Pulse: 71  Temp:   Resp: 15    Post-op Vital Signs: stable   Complications: No apparent anesthesia complications

## 2013-03-22 NOTE — Op Note (Signed)
Ricardo Mayer, Ricardo Mayer                ACCOUNT NO.:  [id number]  MEDICAL RECORD NO.:  [id number]  LOCATION:                                 FACILITY:  PHYSICIAN:  Marshall Cork. Jeffie Pollock, M.D.    DATE OF BIRTH:  July 08, 1941  DATE OF PROCEDURE:  03/21/2013 DATE OF DISCHARGE:                              OPERATIVE REPORT   PROCEDURE:  prostate brachytherapy and cystoscopy.  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  SURGEON:  Marshall Cork. Jeffie Pollock, M.D.  ANESTHESIA:  General.  SPECIMEN:  None.  DRAINS:  None.  BLOOD LOSS:  Minimal.  COMPLICATIONS:  None.  INDICATIONS:  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  He has elected a seed implantation for therapy.  FINDINGS OF PROCEDURE:  He was given Cipro.  He was taken to the operating room where general anesthetic was induced.  He was placed in lithotomy position and fitted with PAS hose.  Red rubber rectal catheter was inserted and secured.  The patient's genitalia was prepped with Betadine solution and a Foley catheter was inserted.  The balloon was filled with dilute contrast and the genitalia was draped out of the perineum.  The perineum was then clipped.  The ultrasound probe was assembled and inserted and secured to the fixed base.  The perineum was then prepped with Betadine solution.  The sterile grid was applied to the ultrasound carrier and the patient was draped in usual sterile fashion.  At this point, the stabilization needles were placed in the prostate after initial ultrasound.  The ultrasound was then used to contour the prostate and create a 3D model for the Nucletron device.  Dr. Tammi Klippel, radiation physicist, then performed intraoperative plan.  Once the plan was complete, I returned to the field for the procedure.  A total of 25 needles were used to place 87 seeds of  iodine-125 according to the intraoperative plan.  Once all seeds were in position, the stabilization needles were removed followed by the ultrasound probe.  Fluoroscopy was performed to obtain images of the implant.  Once that had been completed, the genitalia was draped, the Foley was removed and cystoscopy was performed using a 16- [place] flexible scope.  Examination revealed normal urethra.  The prostatic urethra was without significant obstruction.  Examination of the bladder revealed the smooth wall without tumor, stones, or inflammation.  Ureteral orifices were unremarkable.  No stones or bleeding were noted.  The bladder was minimally full, so the scope was removed and a Foley catheter was not replaced.  The patient's perineum was cleansed and the dressing was applied.  He was taken down from the lithotomy position and moved to recovery room in stable condition.  There were no complications.     Marshall Cork. Jeffie Pollock, M.D.     JJW/MEDQ  D:  03/21/2013  T:  03/22/2013  Job:  [id number]

## 2013-03-22 NOTE — Procedures (Signed)
  Radiation Oncology         [phone] ________________________________  Name: Ricardo Mayer MRN: [id number]  Date: 01/03/2013  DOB: April 02, 1941       Prostate Seed Implant  [id number], Elenore Rota, MD  No ref. provider found  DIAGNOSIS: 72 y.o. 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  RADIATION DOSE: 145 Gy, boost therapy.  TECHNIQUE: Ricardo Mayer was brought to the operating room with the urologist. He was placed in the dorsolithotomy position. He was catheterized and a rectal tube was inserted. The perineum was shaved, prepped and draped. The ultrasound probe was then introduced into the rectum to see the prostate gland.  TREATMENT DEVICE: A needle grid was attached to the ultrasound probe stand and anchor needles were placed.  3D PLANNING: The prostate was imaged in 3D using a sagittal sweep of the prostate probe. These images were transferred to the planning computer. There, the prostate, urethra and rectum were defined on each axial reconstructed image. Then, the software created an optimized plan and a few seed positions were adjusted. The quality of the plan was evaluated in terms of dose volume histograms and isodose reconstructions.  Then the accepted plan was uploaded to the seed Selectron afterloading unit.  SPECIAL TREATMENT PROCEDURE/SUPERVISION AND HANDLING: The Nucletron FIRST system was used to place the needles under sagittal guidance. A total of 23 needles were used to deposit 87 seeds in the prostate gland. The individual seed activity was 0.693 mCi for a total implant activity of 47.502 mCi.  COMPLEX SIMULATION: At the end of the procedure, an anterior radiograph of the pelvis was obtained to document seed positioning and count. Cystoscopy was performed to check the urethra and bladder.  MICRODOSIMETRY: At the end of the procedure, the  patient was emitting 0.067 mrem/hr at 1 meter. Accordingly, he was considered safe for hospital discharge.  PLAN: The patient will return to the radiation oncology clinic for post implant CT dosimetry in three weeks.   ________________________________  Sheral Apley Tammi Klippel, M.D.

## 2013-03-26 ENCOUNTER — Encounter (HOSPITAL_BASED_OUTPATIENT_CLINIC_OR_DEPARTMENT_OTHER): Payer: Self-pay | Admitting: Urology

## 2013-03-26 ENCOUNTER — Encounter (HOSPITAL_BASED_OUTPATIENT_CLINIC_OR_DEPARTMENT_OTHER): Payer: PRIVATE HEALTH INSURANCE | Attending: General Surgery

## 2013-03-26 DIAGNOSIS — L97909 Non-pressure chronic ulcer of unspecified part of unspecified lower leg with unspecified severity: Secondary | ICD-10-CM | POA: Diagnosis not present

## 2013-03-26 DIAGNOSIS — I87319 Chronic venous hypertension (idiopathic) with ulcer of unspecified lower extremity: Secondary | ICD-10-CM | POA: Insufficient documentation

## 2013-03-27 NOTE — H&P (Signed)
Ricardo Mayer, Ricardo Mayer                ACCOUNT NO.:  [id number]  MEDICAL RECORD NO.:  [id number]  LOCATION:  FOOT                         FACILITY:  [facility]  PHYSICIAN:  Elesa Hacker, M.D.        DATE OF BIRTH:  26-Dec-1940  DATE OF ADMISSION:  03/26/2013 DATE OF DISCHARGE:                             HISTORY & PHYSICAL   CHIEF COMPLAINT:  Wound, left leg.  HISTORY OF PRESENT ILLNESS:  This is a 72 year old male sent to [name] from the vascular clinic with normal arterial studies and studies of the veins showing venous insufficiency of the left lower extremity.  He has developed several small wounds and was treated with Unna boot, resulting in scrape of the left posterior ankle.  He had an episode of what was thought to be cellulitis and he responded immediately to oral doxycycline.  He says that the wound is now 100% better.  PAST MEDICAL HISTORY:  Significant for arthritis, chronic renal insufficiency, hypertension, kidney stones, prostate cancer, history of blood clot in the left leg, and history of radiation therapy of the prostate.  PAST SURGICAL HISTORY:  Kidney stone removal and prostate implants.  SOCIAL HISTORY:  Cigarettes none.  Alcohol none.  MEDICATIONS:  Aspirin, Exforge, Finofibrate, furosemide, vitamin D, and doxycycline.  ALLERGIES:  None.  REVIEW OF SYSTEMS:  As above.  PHYSICAL EXAMINATION:  VITAL SIGNS:  Temperature 98.1, pulse 61, respirations 17, blood pressure 153/81. GENERAL APPEARANCE:  Well developed, somewhat obese, no distress. CHEST:  Clear. HEART:  Regular rhythm. ABDOMEN:  Not examined. EXTREMITIES:  Examination of the left lower extremity reveals many stasis dermatitis changes.  ABI was 1.07.  In the left Achilles area, there is a 1.5 x 0.5 x 0.1, very superficial abrasion type of wound.  IMPRESSION:  Chronic __________ hypertension with ulcer posteriorly.  PLAN:  Since, the patient thinks this is a complication of Unna boot, we will treat him with  DuoDERM and elevation.  I will see him in 7 days.     Elesa Hacker, M.D.     RA/MEDQ  D:  03/26/2013  T:  03/27/2013  Job:  [id number]

## 2013-04-02 ENCOUNTER — Other Ambulatory Visit (INDEPENDENT_AMBULATORY_CARE_PROVIDER_SITE_OTHER): Payer: PRIVATE HEALTH INSURANCE

## 2013-04-02 DIAGNOSIS — D649 Anemia, unspecified: Secondary | ICD-10-CM

## 2013-04-02 DIAGNOSIS — I87319 Chronic venous hypertension (idiopathic) with ulcer of unspecified lower extremity: Secondary | ICD-10-CM | POA: Diagnosis not present

## 2013-04-02 DIAGNOSIS — N2581 Secondary hyperparathyroidism of renal origin: Secondary | ICD-10-CM

## 2013-04-02 NOTE — Progress Notes (Signed)
Patient here today for labs ordered by dr. Posey Pronto.

## 2013-04-03 LAB — RENAL FUNCTION PANEL
Albumin: 4.3 g/dL (ref 3.5–4.8)
BUN/Creatinine Ratio: 20 (ref 10–22)
BUN: 40 mg/dL — ABNORMAL HIGH (ref 8–27)
GFR calc Af Amer: 37 mL/min/{1.73_m2} — ABNORMAL LOW (ref >59)
GFR calc non Af Amer: 32 mL/min/{1.73_m2} — ABNORMAL LOW (ref >59)
Glucose: 123 mg/dL — ABNORMAL HIGH (ref 65–99)
Phosphorus: 3 mg/dL (ref 2.5–4.5)

## 2013-04-03 LAB — PROTEIN / CREATININE RATIO, URINE
Creatinine, Ur: 87.8 mg/dL (ref 22.0–328.0)
Protein, Ur: 9.5 mg/dL (ref 0.0–15.0)
Protein/Creat Ratio: 108 mg/g creat (ref 0–200)

## 2013-04-09 DIAGNOSIS — N2581 Secondary hyperparathyroidism of renal origin: Secondary | ICD-10-CM | POA: Diagnosis not present

## 2013-04-09 DIAGNOSIS — C61 Malignant neoplasm of prostate: Secondary | ICD-10-CM | POA: Diagnosis not present

## 2013-04-09 DIAGNOSIS — I129 Hypertensive chronic kidney disease with stage 1 through stage 4 chronic kidney disease, or unspecified chronic kidney disease: Secondary | ICD-10-CM | POA: Diagnosis not present

## 2013-04-11 ENCOUNTER — Telehealth: Payer: Self-pay | Admitting: *Deleted

## 2013-04-11 DIAGNOSIS — I87319 Chronic venous hypertension (idiopathic) with ulcer of unspecified lower extremity: Secondary | ICD-10-CM | POA: Diagnosis not present

## 2013-04-11 NOTE — Telephone Encounter (Signed)
CALLED PATIENT TO [facility]. FOR 04-12-13, TEST HAS BEEN MOVED TO 2 PM ON 04-12-13 AND DR. VISIT TO 2-15 PM ON 04-12-13, LVM FOR A RETURN CALL

## 2013-04-12 ENCOUNTER — Ambulatory Visit: Payer: PRIVATE HEALTH INSURANCE | Admitting: Radiation Oncology

## 2013-04-12 ENCOUNTER — Ambulatory Visit
Admission: RE | Admit: 2013-04-12 | Discharge: 2013-04-12 | Disposition: A | Discharge status: Home / Self Care | Payer: PRIVATE HEALTH INSURANCE | Source: Ambulatory Visit | Attending: Radiation Oncology | Admitting: Radiation Oncology

## 2013-04-12 ENCOUNTER — Encounter: Payer: Self-pay | Admitting: Radiation Oncology

## 2013-04-12 VITALS — BP 122/63 | HR 82 | Resp 16

## 2013-04-12 DIAGNOSIS — C61 Malignant neoplasm of prostate: Secondary | ICD-10-CM | POA: Diagnosis not present

## 2013-04-12 NOTE — Progress Notes (Signed)
Radiation Oncology         [phone] ________________________________  Name: Ricardo Mayer MRN: [id number]  Date: 04/12/2013  DOB: 05-13-1941  Follow-Up Visit Note  CC: [name], MD  [name], MD  Diagnosis:   72 y.o. 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  He is complaining of increased urinary frequency and urinary hesitation symptoms. He filled out a questionnaire regarding urinary function today providing and overall IPSS score of 8 characterizing his symptoms as moderate.  His pre-implant score was also 8. He denies any bowel symptoms.  ALLERGIES:  has No Known Allergies.  Meds: Current Outpatient Prescriptions  Medication Sig Dispense Refill  . acetaminophen (TYLENOL) 500 MG tablet Take 500 mg by mouth every 6 (six) hours as needed for pain.      Marland Kitchen aspirin 81 MG tablet Take 81 mg by mouth daily.      . cholecalciferol (VITAMIN D) 1000 UNITS tablet Take 1,000 Units by mouth daily.      Marland Kitchen EXFORGE 10-320 MG per tablet TAKE 1 TABLET BY MOUTH DAILY.  30 tablet  1  . fenofibrate 160 MG tablet TAKE 1 TABLET BY MOUTH DAILY  30 tablet  4  . furosemide (LASIX) 20 MG tablet Take 1 tablet by mouth Daily.      . ONE TOUCH ULTRA TEST test strip       . doxycycline (VIBRA-TABS) 100 MG tablet Take 1 tablet (100 mg total) by mouth daily.  15 tablet  0  . HYDROcodone-acetaminophen (NORCO) 5-325 MG per tablet Take 1 tablet by mouth every 6 (six) hours as needed for pain.  15 tablet  0   No current facility-administered medications for this encounter.    Physical Findings: The patient is in no acute distress. Patient is alert and oriented.  blood pressure is 122/63 and his pulse is 82. His respiration is 16. .  No significant changes.  Lab Findings: Lab Results  Component Value Date   WBC 7.5 03/15/2013   HGB  14.0 04/02/2013   HCT 41.1 04/02/2013   MCV 85 03/15/2013   PLT 179 03/15/2013    Radiographic Findings:  Patient underwent CT imaging in our clinic for post implant dosimetry. The CT appears to demonstrate an adequate distribution of radioactive seeds throughout the prostate gland. There no seeds in her near the rectum. I suspect the final radiation plan and dosimetry will show appropriate coverage of the prostate gland.   Impression: The patient is recovering from the effects of radiation. His urinary symptoms should gradually improve over the next 4-6 months. We talked about this today.  Plan: Today, I spent time talking to the patient about his prostate seed implant and resolving urinary symptoms. We also talked about long-term follow-up for prostate cancer following seed implant. He understands that ongoing PSA determinations and digital rectal exams will help perform surveillance to rule out disease recurrence. He understands what to expect with his PSA measures. Patient was also educated today about some of the long-term effects would radiation including the Small risk for rectal bleeding and possibly erectile dysfunction. Talked about some of the general management approaches to these potential complications. However, I did encourage the patient to contact her office or return at any point if he has questions or concerns related to his previous  radiation and prostate cancer.   _____________________________________  Sheral Apley. Tammi Klippel, M.D.

## 2013-04-12 NOTE — Progress Notes (Signed)
  Radiation Oncology         [phone] ________________________________  Name: Ricardo Mayer MRN: [id number]  Date: 04/12/2013  DOB: 16-May-1941  COMPLEX SIMULATION NOTE  NARRATIVE:  The patient was brought to the [facility] today following prostate seed implantation approximately one month ago.  Identity was confirmed.  All relevant records and images related to the planned course of therapy were reviewed.  Then, the patient was set-up supine.  CT images were obtained.  The CT images were loaded into the planning software.  Then the prostate and rectum were contoured.  Treatment planning then occurred.  The implanted iodine 125 seeds were identified by the physics staff for projection of radiation distribution  I have requested : 3D Simulation  I have requested a DVH of the following structures: Prostate and rectum.    ________________________________  Sheral Apley Tammi Klippel, M.D.

## 2013-04-12 NOTE — Progress Notes (Signed)
Patient presents for post seed with [facility]. IPSS 8. Vital WDL. Reports that he has a small loose BM during each urination. Reports dysuria but, denies hematuria. Reports on average he void three times during the night. Reports urgency and difficulty holding urine. Reports his urine stream is strong until the very end when it becomes a slow dribble.

## 2013-04-16 ENCOUNTER — Encounter (HOSPITAL_BASED_OUTPATIENT_CLINIC_OR_DEPARTMENT_OTHER): Payer: PRIVATE HEALTH INSURANCE | Attending: General Surgery

## 2013-04-16 DIAGNOSIS — I87319 Chronic venous hypertension (idiopathic) with ulcer of unspecified lower extremity: Secondary | ICD-10-CM | POA: Insufficient documentation

## 2013-04-16 DIAGNOSIS — L97809 Non-pressure chronic ulcer of other part of unspecified lower leg with unspecified severity: Secondary | ICD-10-CM | POA: Diagnosis not present

## 2013-04-16 DIAGNOSIS — L97909 Non-pressure chronic ulcer of unspecified part of unspecified lower leg with unspecified severity: Secondary | ICD-10-CM | POA: Insufficient documentation

## 2013-04-16 DIAGNOSIS — I872 Venous insufficiency (chronic) (peripheral): Secondary | ICD-10-CM | POA: Insufficient documentation

## 2013-04-16 DIAGNOSIS — I739 Peripheral vascular disease, unspecified: Secondary | ICD-10-CM | POA: Diagnosis not present

## 2013-04-18 DIAGNOSIS — I872 Venous insufficiency (chronic) (peripheral): Secondary | ICD-10-CM | POA: Diagnosis not present

## 2013-04-27 ENCOUNTER — Other Ambulatory Visit: Payer: Self-pay | Admitting: Family Medicine

## 2013-04-29 NOTE — Telephone Encounter (Signed)
Last seen 03/18/13  DWM

## 2013-05-02 DIAGNOSIS — I872 Venous insufficiency (chronic) (peripheral): Secondary | ICD-10-CM | POA: Diagnosis not present

## 2013-05-02 DIAGNOSIS — I83009 Varicose veins of unspecified lower extremity with ulcer of unspecified site: Secondary | ICD-10-CM | POA: Diagnosis not present

## 2013-05-09 DIAGNOSIS — I872 Venous insufficiency (chronic) (peripheral): Secondary | ICD-10-CM | POA: Diagnosis not present

## 2013-05-12 ENCOUNTER — Other Ambulatory Visit: Payer: Self-pay | Admitting: Family Medicine

## 2013-05-13 ENCOUNTER — Ambulatory Visit
Admission: RE | Admit: 2013-05-13 | Discharge: 2013-05-13 | Disposition: A | Discharge status: Home / Self Care | Payer: PRIVATE HEALTH INSURANCE | Source: Ambulatory Visit | Attending: Radiation Oncology | Admitting: Radiation Oncology

## 2013-05-13 ENCOUNTER — Encounter: Payer: Self-pay | Admitting: Radiation Oncology

## 2013-05-13 DIAGNOSIS — C61 Malignant neoplasm of prostate: Secondary | ICD-10-CM | POA: Insufficient documentation

## 2013-05-13 NOTE — Progress Notes (Signed)
  Radiation Oncology         [phone] ________________________________  Name: PEJA HARTTER MRN: [id number]  Date: 05/13/2013  DOB: Sep 22, 1940  3-D Planning Note Prostate Brachytherapy  Diagnosis: 72 y.o. 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 He underwent CT scan complex simulation to delineate the three-dimensional structures of the pelvis and demonstrate the radiation distribution.  Since that time, the seed localization, and 3D planning with dose volume histograms have now been completed.  Results:   Prostate Coverage - The dose of radiation delivered to the 90% or more of the prostate gland (D90) was 113.4% of the prescription dose. This exceeds our goal of greater than 90%. Rectal Sparing - The volume of rectal tissue receiving the prescription dose or higher was 0.0 cc. This falls under our thresholds tolerance of 1.0 cc.  Impression: The prostate seed implant appears to show adequate target coverage and appropriate rectal sparing.  Plan:  The patient will continue to follow with urology for ongoing PSA determinations. I would anticipate a high likelihood for local tumor control with minimal risk for rectal morbidity.  ________________________________  Sheral Apley Tammi Klippel, M.D.

## 2013-05-16 ENCOUNTER — Encounter (HOSPITAL_BASED_OUTPATIENT_CLINIC_OR_DEPARTMENT_OTHER): Payer: Medicare Other | Attending: Internal Medicine

## 2013-05-16 DIAGNOSIS — I87319 Chronic venous hypertension (idiopathic) with ulcer of unspecified lower extremity: Secondary | ICD-10-CM | POA: Diagnosis not present

## 2013-05-16 DIAGNOSIS — L97909 Non-pressure chronic ulcer of unspecified part of unspecified lower leg with unspecified severity: Secondary | ICD-10-CM | POA: Diagnosis not present

## 2013-05-16 DIAGNOSIS — I739 Peripheral vascular disease, unspecified: Secondary | ICD-10-CM | POA: Insufficient documentation

## 2013-05-21 DIAGNOSIS — M79609 Pain in unspecified limb: Secondary | ICD-10-CM | POA: Diagnosis not present

## 2013-05-21 DIAGNOSIS — I87039 Postthrombotic syndrome with ulcer and inflammation of unspecified lower extremity: Secondary | ICD-10-CM | POA: Diagnosis not present

## 2013-05-23 DIAGNOSIS — M79609 Pain in unspecified limb: Secondary | ICD-10-CM | POA: Diagnosis not present

## 2013-05-23 DIAGNOSIS — I87039 Postthrombotic syndrome with ulcer and inflammation of unspecified lower extremity: Secondary | ICD-10-CM | POA: Diagnosis not present

## 2013-05-30 DIAGNOSIS — I739 Peripheral vascular disease, unspecified: Secondary | ICD-10-CM | POA: Diagnosis not present

## 2013-05-30 DIAGNOSIS — I87319 Chronic venous hypertension (idiopathic) with ulcer of unspecified lower extremity: Secondary | ICD-10-CM | POA: Diagnosis not present

## 2013-06-06 DIAGNOSIS — I87319 Chronic venous hypertension (idiopathic) with ulcer of unspecified lower extremity: Secondary | ICD-10-CM | POA: Diagnosis not present

## 2013-06-06 DIAGNOSIS — I739 Peripheral vascular disease, unspecified: Secondary | ICD-10-CM | POA: Diagnosis not present

## 2013-07-09 ENCOUNTER — Other Ambulatory Visit (INDEPENDENT_AMBULATORY_CARE_PROVIDER_SITE_OTHER): Payer: Medicare Other

## 2013-07-09 DIAGNOSIS — C61 Malignant neoplasm of prostate: Secondary | ICD-10-CM | POA: Diagnosis not present

## 2013-07-09 NOTE — Progress Notes (Signed)
Pt came in for labs from dr. Jeffie Pollock

## 2013-07-10 ENCOUNTER — Other Ambulatory Visit: Payer: Self-pay | Admitting: Family Medicine

## 2013-07-10 LAB — PSA, TOTAL AND FREE: PSA, Free Pct: 17 %

## 2013-07-16 DIAGNOSIS — C61 Malignant neoplasm of prostate: Secondary | ICD-10-CM | POA: Diagnosis not present

## 2013-07-16 DIAGNOSIS — R350 Frequency of micturition: Secondary | ICD-10-CM | POA: Diagnosis not present

## 2013-07-25 DIAGNOSIS — M1710 Unilateral primary osteoarthritis, unspecified knee: Secondary | ICD-10-CM | POA: Diagnosis not present

## 2013-09-04 ENCOUNTER — Other Ambulatory Visit: Payer: Self-pay | Admitting: Physician Assistant

## 2013-09-04 DIAGNOSIS — C44519 Basal cell carcinoma of skin of other part of trunk: Secondary | ICD-10-CM | POA: Diagnosis not present

## 2013-09-04 DIAGNOSIS — C44611 Basal cell carcinoma of skin of unspecified upper limb, including shoulder: Secondary | ICD-10-CM | POA: Diagnosis not present

## 2013-09-04 DIAGNOSIS — C44319 Basal cell carcinoma of skin of other parts of face: Secondary | ICD-10-CM | POA: Diagnosis not present

## 2013-09-06 ENCOUNTER — Other Ambulatory Visit (INDEPENDENT_AMBULATORY_CARE_PROVIDER_SITE_OTHER): Payer: Medicare Other

## 2013-09-06 ENCOUNTER — Other Ambulatory Visit: Payer: Self-pay | Admitting: Family Medicine

## 2013-09-06 DIAGNOSIS — C61 Malignant neoplasm of prostate: Secondary | ICD-10-CM

## 2013-09-06 DIAGNOSIS — E211 Secondary hyperparathyroidism, not elsewhere classified: Secondary | ICD-10-CM

## 2013-09-06 DIAGNOSIS — N189 Chronic kidney disease, unspecified: Secondary | ICD-10-CM

## 2013-09-06 NOTE — Progress Notes (Signed)
Pt came in for labs only for dr. Jeffie Pollock and dr. Posey Pronto

## 2013-09-07 LAB — RENAL FUNCTION PANEL
ALBUMIN: 4.3 g/dL (ref 3.5–4.8)
BUN / CREAT RATIO: 14 (ref 10–22)
BUN: 27 mg/dL (ref 8–27)
CO2: 22 mmol/L (ref 18–29)
CREATININE: 1.9 mg/dL — AB (ref 0.76–1.27)
Calcium: 9.3 mg/dL (ref 8.6–10.2)
Chloride: 101 mmol/L (ref 97–108)
GFR, EST AFRICAN AMERICAN: 40 mL/min/{1.73_m2} — AB (ref >59)
GFR, EST NON AFRICAN AMERICAN: 34 mL/min/{1.73_m2} — AB (ref >59)
Glucose: 148 mg/dL — ABNORMAL HIGH (ref 65–99)
Phosphorus: 2.9 mg/dL (ref 2.5–4.5)
Potassium: 4.8 mmol/L (ref 3.5–5.2)
SODIUM: 139 mmol/L (ref 134–144)

## 2013-09-07 LAB — URINALYSIS, ROUTINE W REFLEX MICROSCOPIC
BILIRUBIN UA: NEGATIVE
Glucose, UA: NEGATIVE
Ketones, UA: NEGATIVE
Leukocytes, UA: NEGATIVE
NITRITE UA: NEGATIVE
RBC, UA: NEGATIVE
SPEC GRAV UA: 1.017 (ref 1.005–1.030)
UUROB: 0.2 mg/dL (ref 0.0–1.9)
pH, UA: 5.5 (ref 5.0–7.5)

## 2013-09-07 LAB — MAGNESIUM: MAGNESIUM: 1.9 mg/dL (ref 1.6–2.6)

## 2013-09-07 LAB — VITAMIN D 25 HYDROXY (VIT D DEFICIENCY, FRACTURES): Vit D, 25-Hydroxy: 21.9 ng/mL — ABNORMAL LOW (ref 30.0–100.0)

## 2013-09-07 LAB — PTH, INTACT AND CALCIUM: PTH: 38 pg/mL (ref 15–65)

## 2013-09-07 LAB — PSA, TOTAL AND FREE
PSA, Free Pct: 16.9 %
PSA, Free: 0.22 ng/mL
PSA: 1.3 ng/mL (ref 0.0–4.0)

## 2013-09-14 ENCOUNTER — Ambulatory Visit (INDEPENDENT_AMBULATORY_CARE_PROVIDER_SITE_OTHER): Payer: Medicare Other | Admitting: Nurse Practitioner

## 2013-09-14 ENCOUNTER — Encounter: Payer: Self-pay | Admitting: Nurse Practitioner

## 2013-09-14 VITALS — BP 109/68 | HR 80 | Temp 97.9°F | Ht 67.0 in | Wt 250.0 lb

## 2013-09-14 DIAGNOSIS — R509 Fever, unspecified: Secondary | ICD-10-CM | POA: Diagnosis not present

## 2013-09-14 DIAGNOSIS — J101 Influenza due to other identified influenza virus with other respiratory manifestations: Secondary | ICD-10-CM | POA: Diagnosis not present

## 2013-09-14 LAB — POCT INFLUENZA A/B
Influenza A, POC: POSITIVE
Influenza B, POC: NEGATIVE

## 2013-09-14 MED ORDER — OSELTAMIVIR PHOSPHATE 75 MG PO CAPS: 75.0000 mg | ORAL_CAPSULE | Freq: Two times a day (BID) | ORAL | Status: DC

## 2013-09-14 NOTE — Patient Instructions (Signed)

## 2013-09-14 NOTE — Progress Notes (Signed)
   Subjective:    Patient ID: Ricardo Mayer, male    DOB: 1941/07/04, 73 y.o.   MRN: [id number]  HPI Patient in c/o fever chills and body aches that started yesterday- coughing started early yesterday.    Review of Systems  Constitutional: Positive for fever, chills and appetite change.  HENT: Positive for congestion, rhinorrhea, sinus pressure and sneezing. Negative for sore throat and trouble swallowing.   Respiratory: Positive for cough.   Cardiovascular: Negative.   Neurological: Negative.   All other systems reviewed and are negative.       Objective:   Physical Exam  Constitutional: He is oriented to person, place, and time. He appears well-developed and well-nourished.  HENT:  Right Ear: Hearing, tympanic membrane, external ear and ear canal normal.  Left Ear: Hearing, tympanic membrane, external ear and ear canal normal.  Nose: Mucosal edema and rhinorrhea present. Right sinus exhibits no maxillary sinus tenderness and no frontal sinus tenderness. Left sinus exhibits no maxillary sinus tenderness and no frontal sinus tenderness.  Mouth/Throat: Uvula is midline, oropharynx is clear and moist and mucous membranes are normal.  Eyes: Conjunctivae are normal. Pupils are equal, round, and reactive to light.  Neck: Normal range of motion. Neck supple.  Cardiovascular: Normal rate, regular rhythm and normal heart sounds.   Pulmonary/Chest: Effort normal and breath sounds normal.  Abdominal: Soft. Bowel sounds are normal.  Lymphadenopathy:    He has no cervical adenopathy.  Neurological: He is alert and oriented to person, place, and time.  Skin: Skin is warm. He is diaphoretic.  Psychiatric: He has a normal mood and affect. His behavior is normal. Judgment and thought content normal.   BP 109/68  Pulse 80  Temp(Src) 97.9 F (36.6 C) (Oral)  Ht 5\' 7"  (1.702 m)  Wt 250 lb (113.399 kg)  BMI 39.15 kg/m2        Assessment & Plan:   1. Fever, unspecified   2. Influenza A  (H1N1)    Meds ordered this encounter  Medications  . oseltamivir (TAMIFLU) 75 MG capsule    Sig: Take 1 capsule (75 mg total) by mouth 2 (two) times daily.    Dispense:  10 capsule    Refill:  0    Order Specific Question:  Supervising Provider    Answer:  [name] [[id number]]   1. Take meds as prescribed 2. Use a cool mist humidifier especially during the winter months and when heat has been humid. 3. Use saline nose sprays frequently 4. Saline irrigations of the nose can be very helpful if done frequently.  * 4X daily for 1 week*  * Use of a nettie pot can be helpful with this. Follow directions with this* 5. Drink plenty of fluids 6. Keep thermostat turn down low 7.For any cough or congestion  Use plain Mucinex- regular strength or max strength is fine   * Children- consult with Pharmacist for dosing 8. For fever or aces or pains- take tylenol or ibuprofen appropriate for age and weight.  * for fevers greater than 101 orally you may alternate ibuprofen and tylenol every  3 hours.   Mary-Margaret Hassell Done, FNP

## 2013-09-17 DIAGNOSIS — C61 Malignant neoplasm of prostate: Secondary | ICD-10-CM | POA: Diagnosis not present

## 2013-09-17 DIAGNOSIS — N3941 Urge incontinence: Secondary | ICD-10-CM | POA: Diagnosis not present

## 2013-09-18 DIAGNOSIS — N183 Chronic kidney disease, stage 3 unspecified: Secondary | ICD-10-CM | POA: Diagnosis not present

## 2013-09-18 DIAGNOSIS — D631 Anemia in chronic kidney disease: Secondary | ICD-10-CM | POA: Diagnosis not present

## 2013-09-18 DIAGNOSIS — N039 Chronic nephritic syndrome with unspecified morphologic changes: Secondary | ICD-10-CM | POA: Diagnosis not present

## 2013-09-18 DIAGNOSIS — I129 Hypertensive chronic kidney disease with stage 1 through stage 4 chronic kidney disease, or unspecified chronic kidney disease: Secondary | ICD-10-CM | POA: Diagnosis not present

## 2013-09-18 DIAGNOSIS — N2581 Secondary hyperparathyroidism of renal origin: Secondary | ICD-10-CM | POA: Diagnosis not present

## 2013-10-09 ENCOUNTER — Ambulatory Visit (INDEPENDENT_AMBULATORY_CARE_PROVIDER_SITE_OTHER): Payer: Medicare Other | Admitting: Family Medicine

## 2013-10-09 ENCOUNTER — Encounter: Payer: Self-pay | Admitting: Family Medicine

## 2013-10-09 VITALS — BP 131/73 | HR 77 | Temp 97.5°F | Ht 67.0 in | Wt 255.0 lb

## 2013-10-09 DIAGNOSIS — C439 Malignant melanoma of skin, unspecified: Secondary | ICD-10-CM | POA: Insufficient documentation

## 2013-10-09 DIAGNOSIS — Z789 Other specified health status: Secondary | ICD-10-CM | POA: Insufficient documentation

## 2013-10-09 DIAGNOSIS — C61 Malignant neoplasm of prostate: Secondary | ICD-10-CM

## 2013-10-09 DIAGNOSIS — R739 Hyperglycemia, unspecified: Secondary | ICD-10-CM

## 2013-10-09 DIAGNOSIS — I129 Hypertensive chronic kidney disease with stage 1 through stage 4 chronic kidney disease, or unspecified chronic kidney disease: Secondary | ICD-10-CM | POA: Insufficient documentation

## 2013-10-09 DIAGNOSIS — N183 Chronic kidney disease, stage 3 unspecified: Secondary | ICD-10-CM

## 2013-10-09 DIAGNOSIS — I10 Essential (primary) hypertension: Secondary | ICD-10-CM | POA: Diagnosis not present

## 2013-10-09 DIAGNOSIS — N184 Chronic kidney disease, stage 4 (severe): Secondary | ICD-10-CM | POA: Insufficient documentation

## 2013-10-09 DIAGNOSIS — I739 Peripheral vascular disease, unspecified: Secondary | ICD-10-CM | POA: Insufficient documentation

## 2013-10-09 DIAGNOSIS — Z23 Encounter for immunization: Secondary | ICD-10-CM | POA: Diagnosis not present

## 2013-10-09 DIAGNOSIS — R7309 Other abnormal glucose: Secondary | ICD-10-CM

## 2013-10-09 DIAGNOSIS — I872 Venous insufficiency (chronic) (peripheral): Secondary | ICD-10-CM

## 2013-10-09 HISTORY — DX: Malignant melanoma of skin, unspecified: C43.9

## 2013-10-09 MED ORDER — FENOFIBRATE 160 MG PO TABS: ORAL_TABLET | ORAL | Status: DC

## 2013-10-09 MED ORDER — TAMSULOSIN HCL 0.4 MG PO CAPS: 0.4000 mg | ORAL_CAPSULE | Freq: Every day | ORAL | Status: DC

## 2013-10-09 NOTE — Progress Notes (Signed)
Subjective:    Patient ID: Ricardo Mayer, male    DOB: [birth year], 73 y.o.   MRN: [id number]  HPI Pt here for follow up and management of chronic medical problems. The patient is seeing the urologist and the nephrologist. He is due to receive a prep nor vaccine. His lab work will be reviewed with him today. He will be given an FOBT to return today.       Patient Active Problem List   Diagnosis Date Noted  . Prostate cancer 12/25/2012   Outpatient Encounter Prescriptions as of 10/09/2013  Medication Sig  . acetaminophen (TYLENOL) 500 MG tablet Take 500 mg by mouth every 6 (six) hours as needed for pain.  Marland Kitchen aspirin 81 MG tablet Take 81 mg by mouth daily.  Marland Kitchen EXFORGE 10-320 MG per tablet TAKE 1 TABLET BY MOUTH DAILY.  . fenofibrate 160 MG tablet TAKE 1 TABLET BY MOUTH DAILY  . furosemide (LASIX) 20 MG tablet Take 1 tablet by mouth Daily.  . ONE TOUCH ULTRA TEST test strip   . tamsulosin (FLOMAX) 0.4 MG CAPS capsule Take 1 capsule by mouth daily.  . [DISCONTINUED] oseltamivir (TAMIFLU) 75 MG capsule Take 1 capsule (75 mg total) by mouth 2 (two) times daily.    Review of Systems  Constitutional: Negative.   HENT: Negative.   Eyes: Negative.   Respiratory: Negative.   Cardiovascular: Negative.   Gastrointestinal: Negative.   Endocrine: Negative.   Genitourinary: Negative.   Musculoskeletal: Negative.   Skin: Negative.   Allergic/Immunologic: Negative.   Neurological: Negative.   Hematological: Negative.   Psychiatric/Behavioral: Negative.        Objective:   Physical Exam  Nursing note and vitals reviewed. Constitutional: He is oriented to person, place, and time. He appears well-developed and well-nourished. No distress.  Pleasant. Patient says he is working 24 hours a week.  HENT:  Head: Normocephalic and atraumatic.  Right Ear: External ear normal.  Left Ear: External ear normal.  Nose: Nose normal.  Mouth/Throat: Oropharynx is clear and moist. No oropharyngeal  exudate.  Eyes: Conjunctivae and EOM are normal. Pupils are equal, round, and reactive to light. Right eye exhibits no discharge. Left eye exhibits no discharge. No scleral icterus.  Neck: Normal range of motion. Neck supple. No thyromegaly present.  Cardiovascular: Normal rate, regular rhythm, normal heart sounds and intact distal pulses.  Exam reveals no gallop and no friction rub.   No murmur heard. At 72 per minute  Pulmonary/Chest: Effort normal and breath sounds normal. No respiratory distress. He has no wheezes. He has no rales. He exhibits no tenderness.  No axillary adenopathy  Abdominal: Soft. Bowel sounds are normal. He exhibits no mass. There is no tenderness. There is no rebound and no guarding.  Obesity  Musculoskeletal: Normal range of motion. He exhibits no edema and no tenderness.  Lymphadenopathy:    He has no cervical adenopathy.  Neurological: He is alert and oriented to person, place, and time. He has normal reflexes.  Skin: Skin is warm and dry. No rash noted. No erythema. No pallor.  Skin is stable. There were no sores or areas of cellulitis on either leg. He is currently wearing support nose which has helped these chronic infections.  Psychiatric: He has a normal mood and affect. His behavior is normal. Judgment and thought content normal.   BP 131/73  Pulse 77  Temp(Src) 97.5 F (36.4 C) (Oral)  Ht 5\' 7"  (1.702 m)  Wt 255 lb (115.667 kg)  BMI 39.93 kg/m2        Assessment & Plan:  1. Melanoma, history of  2. Prostate cancer  3. Hypertension -Will work on getting approval of his Exforge 10/320 with the insurance company  4. Chronic kidney disease, stage III (moderate)  5. Unspecified venous (peripheral) insufficiency  6. Elevated blood sugar  Patient Instructions  Continue current medications. Continue good therapeutic lifestyle changes which include good diet and exercise. Fall precautions discussed with patient. If an FOBT was given today-  please return it to our front desk. If you are over 12 years old - you may need Prevnar 13 or the adult Pneumonia vaccine. Continue yearly followups with the nephrologist, the urologist, and the dermatologist as directed. On trying to get your generic extra work approved with your insurance especially since your nephrologist wanted you to stay on this medication also.     Arrie Senate MD

## 2013-10-09 NOTE — Patient Instructions (Addendum)
Continue current medications. Continue good therapeutic lifestyle changes which include good diet and exercise. Fall precautions discussed with patient. If an FOBT was given today- please return it to our front desk. If you are over 73 years old - you may need Prevnar 18 or the adult Pneumonia vaccine. Continue yearly followups with the nephrologist, the urologist, and the dermatologist as directed. On trying to get your generic extra work approved with your insurance especially since your nephrologist wanted you to stay on this medication also.

## 2013-10-09 NOTE — Addendum Note (Signed)
Addended by: Marin Olp on: 10/09/2013 11:31 AM   Modules accepted: Orders

## 2013-10-10 ENCOUNTER — Other Ambulatory Visit: Payer: Self-pay | Admitting: Family Medicine

## 2013-11-18 ENCOUNTER — Encounter: Payer: Self-pay | Admitting: Family Medicine

## 2013-11-18 ENCOUNTER — Ambulatory Visit (INDEPENDENT_AMBULATORY_CARE_PROVIDER_SITE_OTHER): Payer: Medicare Other | Admitting: Family Medicine

## 2013-11-18 VITALS — BP 135/78 | HR 77 | Temp 97.7°F | Ht 67.0 in | Wt 255.6 lb

## 2013-11-18 DIAGNOSIS — T148 Other injury of unspecified body region: Secondary | ICD-10-CM

## 2013-11-18 DIAGNOSIS — W57XXXA Bitten or stung by nonvenomous insect and other nonvenomous arthropods, initial encounter: Secondary | ICD-10-CM

## 2013-11-18 MED ORDER — DOXYCYCLINE HYCLATE 100 MG PO TABS
100.0000 mg | ORAL_TABLET | Freq: Two times a day (BID) | ORAL | Status: DC
Start: 2013-11-18 — End: 2013-12-30

## 2013-11-18 NOTE — Progress Notes (Signed)
   Subjective:    Patient ID: Ricardo Mayer, male    DOB: Dec 17, 1940, 73 y.o.   MRN: [id number]  HPI  C/o tick bite and rash on back  Review of Systems C/o rash on back   No chest pain, SOB, HA, dizziness, vision change, N/V, diarrhea, constipation, dysuria, urinary urgency or frequency, myalgias, arthralgias.  Objective:   Physical Exam Vital signs noted  Well developed well nourished male.  HEENT - Head atraumatic Normocephalic                Eyes - PERRLA, Conjuctiva - clear Sclera- Clear EOMI                Ears - EAC's Wnl TM's Wnl Gross Hearing WNL                Throat - oropharanx wnl Respiratory - Lungs CTA bilateral Cardiac - RRR S1 and S2 without murmur GI - Abdomen soft Nontender and bowel sounds active x 4 Skin - Erythematous raised rash with central clearing on right lower back    Assessment & Plan:  Tick bite - Plan: doxycycline (VIBRA-TABS) 100 MG tablet po bid x 10 days #20  Lysbeth Penner FNP

## 2013-12-03 DIAGNOSIS — I8310 Varicose veins of unspecified lower extremity with inflammation: Secondary | ICD-10-CM | POA: Diagnosis not present

## 2013-12-03 DIAGNOSIS — D235 Other benign neoplasm of skin of trunk: Secondary | ICD-10-CM | POA: Diagnosis not present

## 2013-12-30 ENCOUNTER — Other Ambulatory Visit: Payer: Self-pay

## 2013-12-30 DIAGNOSIS — M1710 Unilateral primary osteoarthritis, unspecified knee: Secondary | ICD-10-CM | POA: Diagnosis not present

## 2013-12-30 DIAGNOSIS — W57XXXA Bitten or stung by nonvenomous insect and other nonvenomous arthropods, initial encounter: Secondary | ICD-10-CM

## 2013-12-30 MED ORDER — FENOFIBRATE 160 MG PO TABS: ORAL_TABLET | ORAL | Status: DC

## 2013-12-30 MED ORDER — DOXYCYCLINE HYCLATE 100 MG PO TABS: 100.0000 mg | ORAL_TABLET | Freq: Two times a day (BID) | ORAL | Status: DC

## 2013-12-30 MED ORDER — TRIAMCINOLONE ACETONIDE 0.1 % EX CREA: 1.0000 "application " | TOPICAL_CREAM | Freq: Two times a day (BID) | CUTANEOUS | Status: DC

## 2013-12-30 NOTE — Telephone Encounter (Signed)
lst seen 11/18/13  [facility] [place]  No lipids in Epic and Triamcinolone not on EPIC list  This is for mail order

## 2014-03-17 ENCOUNTER — Other Ambulatory Visit (INDEPENDENT_AMBULATORY_CARE_PROVIDER_SITE_OTHER): Payer: Medicare Other

## 2014-03-17 DIAGNOSIS — Z125 Encounter for screening for malignant neoplasm of prostate: Secondary | ICD-10-CM | POA: Diagnosis not present

## 2014-03-17 DIAGNOSIS — D631 Anemia in chronic kidney disease: Secondary | ICD-10-CM

## 2014-03-17 DIAGNOSIS — N183 Chronic kidney disease, stage 3 unspecified: Secondary | ICD-10-CM

## 2014-03-17 DIAGNOSIS — N039 Chronic nephritic syndrome with unspecified morphologic changes: Secondary | ICD-10-CM

## 2014-03-17 DIAGNOSIS — N2581 Secondary hyperparathyroidism of renal origin: Secondary | ICD-10-CM

## 2014-03-17 NOTE — Progress Notes (Signed)
Pt came in for lab  only 

## 2014-03-18 ENCOUNTER — Telehealth: Payer: Self-pay | Admitting: Family Medicine

## 2014-03-18 LAB — RENAL FUNCTION PANEL
ALBUMIN: 4.2 g/dL (ref 3.5–4.8)
BUN/Creatinine Ratio: 18 (ref 10–22)
BUN: 34 mg/dL — AB (ref 8–27)
CALCIUM: 9.5 mg/dL (ref 8.6–10.2)
CHLORIDE: 101 mmol/L (ref 97–108)
CO2: 22 mmol/L (ref 18–29)
Creatinine, Ser: 1.88 mg/dL — ABNORMAL HIGH (ref 0.76–1.27)
GFR calc non Af Amer: 35 mL/min/{1.73_m2} — ABNORMAL LOW (ref >59)
GFR, EST AFRICAN AMERICAN: 40 mL/min/{1.73_m2} — AB (ref >59)
GLUCOSE: 141 mg/dL — AB (ref 65–99)
PHOSPHORUS: 3 mg/dL (ref 2.5–4.5)
POTASSIUM: 5 mmol/L (ref 3.5–5.2)
Sodium: 137 mmol/L (ref 134–144)

## 2014-03-18 LAB — PSA, TOTAL AND FREE
PSA FREE PCT: 16.7 %
PSA, Free: 0.15 ng/mL
PSA: 0.9 ng/mL (ref 0.0–4.0)

## 2014-03-18 LAB — HEMOGLOBIN: Hemoglobin: 14.9 g/dL (ref 12.6–17.7)

## 2014-03-18 LAB — HEMATOCRIT: HEMATOCRIT: 43.9 % (ref 37.5–51.0)

## 2014-03-18 LAB — PARATHYROID HORMONE, INTACT (NO CA): PTH: 18 pg/mL (ref 15–65)

## 2014-03-18 LAB — VITAMIN D 25 HYDROXY (VIT D DEFICIENCY, FRACTURES): Vit D, 25-Hydroxy: 21.9 ng/mL — ABNORMAL LOW (ref 30.0–100.0)

## 2014-03-18 LAB — MAGNESIUM: Magnesium: 2.1 mg/dL (ref 1.6–2.6)

## 2014-03-18 NOTE — Telephone Encounter (Signed)
Message copied by Waverly Ferrari on Tue Mar 18, 2014 11:26 AM ------      Message from: [name]      Created: Tue Mar 18, 2014  7:12 AM       The PSA is 0.9. This continues to drop. It is very important to forward a copy of this result to his urologist.++++++      The blood sugar was 141. The creatinine, the mesenteric kidney function test is 1.88. This is elevated. He is stable and consistent with past readings. The electrolytes including potassium are within normal limits.+++++++ please make sure that this lab result gets forwarded to his nephrologist.      The magnesium level is within normal limits.      PTH is within normal limit      The hematocrit is good and within normal limit, the hemoglobin is good at 14.9      The vitamin D level remains low.      Once again, please make sure that this lab result gets sent to his urologist and nephrologist ------

## 2014-03-25 DIAGNOSIS — C61 Malignant neoplasm of prostate: Secondary | ICD-10-CM | POA: Diagnosis not present

## 2014-03-25 DIAGNOSIS — R350 Frequency of micturition: Secondary | ICD-10-CM | POA: Diagnosis not present

## 2014-03-25 DIAGNOSIS — N3941 Urge incontinence: Secondary | ICD-10-CM | POA: Diagnosis not present

## 2014-03-26 DIAGNOSIS — N2581 Secondary hyperparathyroidism of renal origin: Secondary | ICD-10-CM | POA: Diagnosis not present

## 2014-03-26 DIAGNOSIS — I129 Hypertensive chronic kidney disease with stage 1 through stage 4 chronic kidney disease, or unspecified chronic kidney disease: Secondary | ICD-10-CM | POA: Diagnosis not present

## 2014-03-26 DIAGNOSIS — N183 Chronic kidney disease, stage 3 unspecified: Secondary | ICD-10-CM | POA: Diagnosis not present

## 2014-03-26 DIAGNOSIS — D631 Anemia in chronic kidney disease: Secondary | ICD-10-CM | POA: Diagnosis not present

## 2014-04-01 DIAGNOSIS — L821 Other seborrheic keratosis: Secondary | ICD-10-CM | POA: Diagnosis not present

## 2014-04-01 DIAGNOSIS — D239 Other benign neoplasm of skin, unspecified: Secondary | ICD-10-CM | POA: Diagnosis not present

## 2014-04-12 ENCOUNTER — Other Ambulatory Visit: Payer: Self-pay | Admitting: Family Medicine

## 2014-04-17 ENCOUNTER — Telehealth: Payer: Self-pay | Admitting: Family Medicine

## 2014-04-17 ENCOUNTER — Encounter: Payer: Self-pay | Admitting: Family Medicine

## 2014-04-17 ENCOUNTER — Ambulatory Visit (INDEPENDENT_AMBULATORY_CARE_PROVIDER_SITE_OTHER): Payer: Medicare Other | Admitting: Family Medicine

## 2014-04-17 VITALS — BP 113/73 | HR 86 | Temp 97.0°F | Ht 67.0 in | Wt 250.0 lb

## 2014-04-17 DIAGNOSIS — R739 Hyperglycemia, unspecified: Secondary | ICD-10-CM

## 2014-04-17 DIAGNOSIS — I10 Essential (primary) hypertension: Secondary | ICD-10-CM | POA: Diagnosis not present

## 2014-04-17 DIAGNOSIS — R7309 Other abnormal glucose: Secondary | ICD-10-CM

## 2014-04-17 DIAGNOSIS — N183 Chronic kidney disease, stage 3 unspecified: Secondary | ICD-10-CM

## 2014-04-17 DIAGNOSIS — C61 Malignant neoplasm of prostate: Secondary | ICD-10-CM | POA: Diagnosis not present

## 2014-04-17 DIAGNOSIS — Z888 Allergy status to other drugs, medicaments and biological substances status: Secondary | ICD-10-CM

## 2014-04-17 DIAGNOSIS — E559 Vitamin D deficiency, unspecified: Secondary | ICD-10-CM

## 2014-04-17 DIAGNOSIS — Z789 Other specified health status: Secondary | ICD-10-CM

## 2014-04-17 LAB — POCT CBC
GRANULOCYTE PERCENT: 71.8 % (ref 37–80)
HEMATOCRIT: 41.8 % — AB (ref 43.5–53.7)
Hemoglobin: 13.9 g/dL — AB (ref 14.1–18.1)
Lymph, poc: 1.9 (ref 0.6–3.4)
MCH, POC: 29.6 pg (ref 27–31.2)
MCHC: 33.3 g/dL (ref 31.8–35.4)
MCV: 88.8 fL (ref 80–97)
MPV: 6.6 fL (ref 0–99.8)
POC Granulocyte: 5.8 (ref 2–6.9)
POC LYMPH PERCENT: 22.9 %L (ref 10–50)
Platelet Count, POC: 165 10*3/uL (ref 142–424)
RBC: 4.7 M/uL (ref 4.69–6.13)
RDW, POC: 13.9 %
WBC: 8.1 10*3/uL (ref 4.6–10.2)

## 2014-04-17 MED ORDER — TAMSULOSIN HCL 0.4 MG PO CAPS: 0.4000 mg | ORAL_CAPSULE | Freq: Every day | ORAL | Status: DC

## 2014-04-17 MED ORDER — AMLODIPINE BESYLATE-VALSARTAN 10-320 MG PO TABS: ORAL_TABLET | ORAL | Status: DC

## 2014-04-17 MED ORDER — FENOFIBRATE 160 MG PO TABS: ORAL_TABLET | ORAL | Status: DC

## 2014-04-17 MED ORDER — FUROSEMIDE 20 MG PO TABS: 20.0000 mg | ORAL_TABLET | Freq: Every day | ORAL | Status: DC

## 2014-04-17 NOTE — Telephone Encounter (Signed)
Please discuss with Dietrich Pates and call pt on Friday

## 2014-04-17 NOTE — Patient Instructions (Addendum)
Medicare Annual Wellness Visit  [facility] and the medical providers at [facility] strive to bring you the best medical care.  In doing so we not only want to address your current medical conditions and concerns but also to detect new conditions early and prevent illness, disease and health-related problems.    Medicare offers a yearly Wellness Visit which allows our clinical staff to assess your need for preventative services including immunizations, lifestyle education, counseling to decrease risk of preventable diseases and screening for fall risk and other medical concerns.    This visit is provided free of charge (no copay) for all Medicare recipients. The clinical pharmacists at [facility] have begun to conduct these Wellness Visits which will also include a thorough review of all your medications.    As you primary medical provider recommend that you make an appointment for your Annual Wellness Visit if you have not done so already this year.  You may set up this appointment before you leave today or you may call back [id number]) and schedule an appointment.  Please make sure when you call that you mention that you are scheduling your Annual Wellness Visit with the clinical pharmacist so that the appointment may be made for the proper length of time.     Continue current medications. Continue good therapeutic lifestyle changes which include good diet and exercise. Fall precautions discussed with patient. If an FOBT was given today- please return it to our front desk. If you are over 59 years old - you may need Prevnar 58 or the adult Pneumonia vaccine.  Flu Shots will be available at our office starting mid- September. Please call and schedule a FLU CLINIC APPOINTMENT.   Continue to monitor sugars and blood pressures that time Continue to control  weight with diet and exercise Plenty of fluids We will call you with the  results of your additional lab work as soon as those results are available

## 2014-04-17 NOTE — Progress Notes (Signed)
Subjective:    Patient ID: Ricardo Mayer, male    DOB: October 06, 1940, 73 y.o.   MRN: [id number]  HPI Pt here for follow up and management of chronic medical problems. This patient has no specific complaints. His significant history is as renal insufficiency and his prostate cancer. He just saw the urologist recently. He is due to return in FOBT. He is also due to get lab work. All of his medicines need to be refilled. As usual he has no complaints.         Patient Active Problem List   Diagnosis Date Noted  . Melanoma, history of 10/09/2013  . Hypertension 10/09/2013  . Chronic kidney disease, stage III (moderate) 10/09/2013  . Unspecified venous (peripheral) insufficiency 10/09/2013  . Statin intolerance 10/09/2013  . Prostate cancer 12/25/2012   Outpatient Encounter Prescriptions as of 04/17/2014  Medication Sig  . acetaminophen (TYLENOL) 500 MG tablet Take 500 mg by mouth every 6 (six) hours as needed for pain.  Marland Kitchen amLODipine-valsartan (EXFORGE) 10-320 MG per tablet TAKE 1 TABLET BY MOUTH DAILY.  Marland Kitchen aspirin 81 MG tablet Take 81 mg by mouth daily.  . fenofibrate 160 MG tablet TAKE 1 TABLET BY MOUTH DAILY  . furosemide (LASIX) 20 MG tablet Take 1 tablet by mouth Daily.  . ONE TOUCH ULTRA TEST test strip   . tamsulosin (FLOMAX) 0.4 MG CAPS capsule Take 1 capsule (0.4 mg total) by mouth daily.  Marland Kitchen triamcinolone cream (KENALOG) 0.1 % Apply 1 application topically 2 (two) times daily.  Marland Kitchen doxycycline (VIBRA-TABS) 100 MG tablet Take 1 tablet (100 mg total) by mouth 2 (two) times daily.    Review of Systems  Constitutional: Negative.   HENT: Negative.   Eyes: Negative.   Respiratory: Negative.   Cardiovascular: Negative.   Gastrointestinal: Negative.   Endocrine: Negative.   Genitourinary: Negative.   Musculoskeletal: Negative.   Skin: Negative.   Allergic/Immunologic: Negative.   Neurological: Negative.   Hematological: Negative.   Psychiatric/Behavioral: Negative.          Objective:   Physical Exam  Nursing note and vitals reviewed. Constitutional: He is oriented to person, place, and time. He appears well-developed and well-nourished. No distress.  HENT:  Head: Normocephalic and atraumatic.  Right Ear: External ear normal.  Left Ear: External ear normal.  Mouth/Throat: Oropharynx is clear and moist. No oropharyngeal exudate.  Nasal congestion bilateral  Eyes: Conjunctivae and EOM are normal. Pupils are equal, round, and reactive to light. Right eye exhibits no discharge. Left eye exhibits no discharge. No scleral icterus.  Neck: Normal range of motion. Neck supple. No thyromegaly present.  No carotid bruits  Cardiovascular: Normal rate, regular rhythm, normal heart sounds and intact distal pulses.  Exam reveals no gallop and no friction rub.   No murmur heard. At 84 per minute  Pulmonary/Chest: Effort normal and breath sounds normal. No respiratory distress. He has no wheezes. He has no rales. He exhibits no tenderness.  No axillary adenopathy  Abdominal: Soft. Bowel sounds are normal. He exhibits no mass. There is no tenderness. There is no rebound and no guarding.  No inguinal adenopathy. No abdominal bruits  Musculoskeletal: Normal range of motion. He exhibits no edema and no tenderness.  Lymphadenopathy:    He has no cervical adenopathy.  Neurological: He is alert and oriented to person, place, and time. He has normal reflexes. No cranial nerve deficit.  Skin: Skin is warm and dry. No rash noted. No erythema. No pallor.  Psychiatric: He has a normal mood and affect. His behavior is normal. Judgment and thought content normal.   BP 113/73  Pulse 86  Temp(Src) 97 F (36.1 C) (Oral)  Ht '5\' 7"'  (1.702 m)  Wt 250 lb (113.399 kg)  BMI 39.15 kg/m2        Assessment & Plan:  1. Prostate cancer - POCT CBC  2. Essential hypertension - BMP8+EGFR - Lipid panel - Hepatic function panel - POCT CBC  3. Chronic kidney disease, stage III  (moderate) - BMP8+EGFR - POCT CBC  4. Vitamin D deficiency - Vit D  25 hydroxy (rtn osteoporosis monitoring)  5. Elevated blood sugar   6. Statin intolerance Meds ordered this encounter  Medications  . tamsulosin (FLOMAX) 0.4 MG CAPS capsule    Sig: Take 1 capsule (0.4 mg total) by mouth daily.    Dispense:  90 capsule    Refill:  3  . furosemide (LASIX) 20 MG tablet    Sig: Take 1 tablet (20 mg total) by mouth daily.    Dispense:  90 tablet    Refill:  3  . fenofibrate 160 MG tablet    Sig: TAKE 1 TABLET BY MOUTH DAILY    Dispense:  90 tablet    Refill:  3  . amLODipine-valsartan (EXFORGE) 10-320 MG per tablet    Sig: TAKE 1 TABLET BY MOUTH DAILY.    Dispense:  90 tablet    Refill:  3   Patient Instructions                       Medicare Annual Wellness Visit  [facility] and the medical providers at [facility] strive to bring you the best medical care.  In doing so we not only want to address your current medical conditions and concerns but also to detect new conditions early and prevent illness, disease and health-related problems.    Medicare offers a yearly Wellness Visit which allows our clinical staff to assess your need for preventative services including immunizations, lifestyle education, counseling to decrease risk of preventable diseases and screening for fall risk and other medical concerns.    This visit is provided free of charge (no copay) for all Medicare recipients. The clinical pharmacists at [facility] have begun to conduct these Wellness Visits which will also include a thorough review of all your medications.    As you primary medical provider recommend that you make an appointment for your Annual Wellness Visit if you have not done so already this year.  You may set up this appointment before you leave today or you may call back [phone]) and schedule an appointment.  Please make sure when you call that you  mention that you are scheduling your Annual Wellness Visit with the clinical pharmacist so that the appointment may be made for the proper length of time.     Continue current medications. Continue good therapeutic lifestyle changes which include good diet and exercise. Fall precautions discussed with patient. If an FOBT was given today- please return it to our front desk. If you are over 76 years old - you may need Prevnar 65 or the adult Pneumonia vaccine.  Flu Shots will be available at our office starting mid- September. Please call and schedule a FLU CLINIC APPOINTMENT.   Continue to monitor sugars and blood pressures that time Continue to control  weight with diet and exercise Plenty of fluids We will call you  with the results of your additional lab work as soon as those results are available   Arrie Senate MD

## 2014-04-18 ENCOUNTER — Other Ambulatory Visit: Payer: Self-pay | Admitting: Family Medicine

## 2014-04-18 LAB — HEPATIC FUNCTION PANEL
ALBUMIN: 3.9 g/dL (ref 3.5–4.8)
ALK PHOS: 54 IU/L (ref 39–117)
ALT: 17 IU/L (ref 0–44)
AST: 20 IU/L (ref 0–40)
BILIRUBIN DIRECT: 0.1 mg/dL (ref 0.00–0.40)
BILIRUBIN TOTAL: 0.4 mg/dL (ref 0.0–1.2)
TOTAL PROTEIN: 5.8 g/dL — AB (ref 6.0–8.5)

## 2014-04-18 LAB — BMP8+EGFR
BUN/Creatinine Ratio: 20 (ref 10–22)
BUN: 39 mg/dL — ABNORMAL HIGH (ref 8–27)
CO2: 18 mmol/L (ref 18–29)
Calcium: 9.2 mg/dL (ref 8.6–10.2)
Chloride: 101 mmol/L (ref 97–108)
Creatinine, Ser: 1.92 mg/dL — ABNORMAL HIGH (ref 0.76–1.27)
GFR calc Af Amer: 39 mL/min/{1.73_m2} — ABNORMAL LOW (ref >59)
GFR, EST NON AFRICAN AMERICAN: 34 mL/min/{1.73_m2} — AB (ref >59)
Glucose: 127 mg/dL — ABNORMAL HIGH (ref 65–99)
POTASSIUM: 4.8 mmol/L (ref 3.5–5.2)
Sodium: 136 mmol/L (ref 134–144)

## 2014-04-18 LAB — LIPID PANEL
CHOLESTEROL TOTAL: 129 mg/dL (ref 100–199)
Chol/HDL Ratio: 3.8 ratio units (ref 0.0–5.0)
HDL: 34 mg/dL — ABNORMAL LOW (ref >39)
LDL Calculated: 56 mg/dL (ref 0–99)
Triglycerides: 194 mg/dL — ABNORMAL HIGH (ref 0–149)
VLDL CHOLESTEROL CAL: 39 mg/dL (ref 5–40)

## 2014-04-18 LAB — VITAMIN D 25 HYDROXY (VIT D DEFICIENCY, FRACTURES): Vit D, 25-Hydroxy: 32.5 ng/mL (ref 30.0–100.0)

## 2014-04-18 NOTE — Telephone Encounter (Signed)
[employer] called and spoke with wife and discussed with her that patient is to stop the doxicycline that it was supposed to be for 10 days only. Pt's wife said he had already stopped it as of yesterday.

## 2014-04-22 ENCOUNTER — Telehealth: Payer: Self-pay | Admitting: Family Medicine

## 2014-04-22 NOTE — Telephone Encounter (Signed)
Pt just needed date of visit with [facility]- they couldn't remember him coming to see him- but he did and it was for a tick bite

## 2014-04-30 ENCOUNTER — Encounter: Payer: Self-pay | Admitting: Internal Medicine

## 2014-05-08 DIAGNOSIS — M1710 Unilateral primary osteoarthritis, unspecified knee: Secondary | ICD-10-CM | POA: Diagnosis not present

## 2014-08-15 DIAGNOSIS — D229 Melanocytic nevi, unspecified: Secondary | ICD-10-CM

## 2014-08-15 HISTORY — DX: Melanocytic nevi, unspecified: D22.9

## 2014-08-28 ENCOUNTER — Other Ambulatory Visit (INDEPENDENT_AMBULATORY_CARE_PROVIDER_SITE_OTHER): Payer: Medicare Other

## 2014-08-28 DIAGNOSIS — N2581 Secondary hyperparathyroidism of renal origin: Secondary | ICD-10-CM

## 2014-08-28 DIAGNOSIS — I1310 Hypertensive heart and chronic kidney disease without heart failure, with stage 1 through stage 4 chronic kidney disease, or unspecified chronic kidney disease: Secondary | ICD-10-CM | POA: Diagnosis not present

## 2014-08-28 DIAGNOSIS — N183 Chronic kidney disease, stage 3 unspecified: Secondary | ICD-10-CM

## 2014-08-28 DIAGNOSIS — E784 Other hyperlipidemia: Secondary | ICD-10-CM | POA: Diagnosis not present

## 2014-08-28 DIAGNOSIS — N189 Chronic kidney disease, unspecified: Secondary | ICD-10-CM | POA: Diagnosis not present

## 2014-08-28 DIAGNOSIS — R5383 Other fatigue: Secondary | ICD-10-CM | POA: Diagnosis not present

## 2014-08-28 NOTE — Progress Notes (Signed)
Lab only ordered by Dr. Posey Pronto

## 2014-08-29 LAB — CBC WITH DIFFERENTIAL
Basophils Absolute: 0 10*3/uL (ref 0.0–0.2)
Basos: 0 %
EOS ABS: 0.1 10*3/uL (ref 0.0–0.4)
Eos: 1 %
HCT: 43.5 % (ref 37.5–51.0)
Hemoglobin: 15.1 g/dL (ref 12.6–17.7)
Immature Grans (Abs): 0 10*3/uL (ref 0.0–0.1)
Immature Granulocytes: 0 %
Lymphocytes Absolute: 1.7 10*3/uL (ref 0.7–3.1)
Lymphs: 21 %
MCH: 29.7 pg (ref 26.6–33.0)
MCHC: 34.7 g/dL (ref 31.5–35.7)
MCV: 86 fL (ref 79–97)
MONOCYTES: 8 %
MONOS ABS: 0.6 10*3/uL (ref 0.1–0.9)
Neutrophils Absolute: 5.6 10*3/uL (ref 1.4–7.0)
Neutrophils Relative %: 70 %
PLATELETS: 158 10*3/uL (ref 150–379)
RBC: 5.08 x10E6/uL (ref 4.14–5.80)
RDW: 13.6 % (ref 12.3–15.4)
WBC: 8 10*3/uL (ref 3.4–10.8)

## 2014-08-29 LAB — RENAL FUNCTION PANEL
Albumin: 3.9 g/dL (ref 3.5–4.8)
BUN/Creatinine Ratio: 17 (ref 10–22)
BUN: 27 mg/dL (ref 8–27)
CO2: 20 mmol/L (ref 18–29)
Calcium: 9.3 mg/dL (ref 8.6–10.2)
Chloride: 100 mmol/L (ref 97–108)
Creatinine, Ser: 1.6 mg/dL — ABNORMAL HIGH (ref 0.76–1.27)
GFR calc Af Amer: 49 mL/min/{1.73_m2} — ABNORMAL LOW (ref >59)
GFR calc non Af Amer: 42 mL/min/{1.73_m2} — ABNORMAL LOW (ref >59)
GLUCOSE: 241 mg/dL — AB (ref 65–99)
POTASSIUM: 4.5 mmol/L (ref 3.5–5.2)
Phosphorus: 2.9 mg/dL (ref 2.5–4.5)
SODIUM: 138 mmol/L (ref 134–144)

## 2014-08-29 LAB — VITAMIN D 25 HYDROXY (VIT D DEFICIENCY, FRACTURES): VIT D 25 HYDROXY: 22.4 ng/mL — AB (ref 30.0–100.0)

## 2014-08-29 LAB — MAGNESIUM: MAGNESIUM: 2 mg/dL (ref 1.6–2.3)

## 2014-08-29 LAB — URIC ACID: URIC ACID: 6.1 mg/dL (ref 3.7–8.6)

## 2014-09-03 ENCOUNTER — Encounter: Payer: Self-pay | Admitting: Family Medicine

## 2014-09-03 ENCOUNTER — Ambulatory Visit (INDEPENDENT_AMBULATORY_CARE_PROVIDER_SITE_OTHER): Payer: Medicare Other | Admitting: Family Medicine

## 2014-09-03 VITALS — BP 138/71 | HR 88 | Temp 97.0°F | Ht 67.0 in | Wt 251.0 lb

## 2014-09-03 DIAGNOSIS — I10 Essential (primary) hypertension: Secondary | ICD-10-CM | POA: Diagnosis not present

## 2014-09-03 DIAGNOSIS — N183 Chronic kidney disease, stage 3 unspecified: Secondary | ICD-10-CM

## 2014-09-03 DIAGNOSIS — I8312 Varicose veins of left lower extremity with inflammation: Secondary | ICD-10-CM | POA: Diagnosis not present

## 2014-09-03 DIAGNOSIS — R739 Hyperglycemia, unspecified: Secondary | ICD-10-CM

## 2014-09-03 DIAGNOSIS — R7309 Other abnormal glucose: Secondary | ICD-10-CM

## 2014-09-03 DIAGNOSIS — C61 Malignant neoplasm of prostate: Secondary | ICD-10-CM

## 2014-09-03 DIAGNOSIS — I872 Venous insufficiency (chronic) (peripheral): Secondary | ICD-10-CM

## 2014-09-03 DIAGNOSIS — R6889 Other general symptoms and signs: Secondary | ICD-10-CM | POA: Diagnosis not present

## 2014-09-03 DIAGNOSIS — R251 Tremor, unspecified: Secondary | ICD-10-CM

## 2014-09-03 DIAGNOSIS — I8311 Varicose veins of right lower extremity with inflammation: Secondary | ICD-10-CM

## 2014-09-03 DIAGNOSIS — E559 Vitamin D deficiency, unspecified: Secondary | ICD-10-CM

## 2014-09-03 MED ORDER — TAMSULOSIN HCL 0.4 MG PO CAPS
0.4000 mg | ORAL_CAPSULE | Freq: Every day | ORAL | Status: DC
Start: 2014-09-03 — End: 2015-05-07

## 2014-09-03 NOTE — Progress Notes (Addendum)
Subjective:    Patient ID: Ricardo Mayer, male    DOB: 1941-03-28, 74 y.o.   MRN: [id number]  HPI Pt here for follow up and management of chronic medical problems which include hypertension, hyperlipidemia, and chronic kidney disease. He is taking medications regularly. The patient continues to be followed by the urologist for his prostate and frequency issues. He is requesting a refill on his Flomax which we will do. He complains today of a tremor in both hands some swelling and circulation issues with his legs and being colder than usual. We will review his lab work with him and he will be given an FOBT to return. The patient does not check his blood sugars anymore at home because he said they were doing so well running in the low 100s fasting. He also does not check his blood pressures. He is followed regularly by the urologist because of his prostate cancer and has had seed implants for this and sees the urologist yearly. His wife is concerned about the tremor and they have noticed this for about 6 months. He has noticed a cold intolerance more recently. He also complains of the swelling in his legs and he is using support hose as recommended by the vascular surgeon and he is also applying Saran wrap with emollients at nighttime for the stasis dermatitis and this seems to help a lot. Recent lab work was reviewed with patient and he understands that basic treatments will be continued as doing for his cholesterol blood sugar and hypertension.         Patient Active Problem List   Diagnosis Date Noted  . Melanoma, history of 10/09/2013  . Hypertension 10/09/2013  . Chronic kidney disease, stage III (moderate) 10/09/2013  . Unspecified venous (peripheral) insufficiency 10/09/2013  . Statin intolerance 10/09/2013  . Prostate cancer 12/25/2012   Outpatient Encounter Prescriptions as of 09/03/2014  Medication Sig  . acetaminophen (TYLENOL) 500 MG tablet Take 500 mg by mouth every 6 (six) hours  as needed for pain.  Marland Kitchen amLODipine-valsartan (EXFORGE) 10-320 MG per tablet TAKE 1 TABLET BY MOUTH DAILY.  Marland Kitchen aspirin 81 MG tablet Take 81 mg by mouth daily.  . cholecalciferol (VITAMIN D) 1000 UNITS tablet Take 1,000 Units by mouth daily.  . fenofibrate 160 MG tablet TAKE 1 TABLET BY MOUTH DAILY  . ONE TOUCH ULTRA TEST test strip   . tamsulosin (FLOMAX) 0.4 MG CAPS capsule Take 1 capsule (0.4 mg total) by mouth daily.  Marland Kitchen triamcinolone cream (KENALOG) 0.1 % Apply 1 application topically 2 (two) times daily.  . [DISCONTINUED] doxycycline (VIBRA-TABS) 100 MG tablet Take 1 tablet (100 mg total) by mouth 2 (two) times daily.  . [DISCONTINUED] furosemide (LASIX) 20 MG tablet Take 1 tablet (20 mg total) by mouth daily.    Review of Systems  Constitutional: Negative.   HENT: Negative.   Eyes: Negative.   Respiratory: Negative.   Cardiovascular: Positive for leg swelling (circulation issues -legs).  Gastrointestinal: Negative.   Endocrine: Positive for cold intolerance.  Genitourinary: Positive for frequency (followed by Dr  Jeffie Pollock).  Musculoskeletal: Negative.   Skin: Negative.   Allergic/Immunologic: Negative.   Neurological: Positive for tremors (hands).  Hematological: Negative.   Psychiatric/Behavioral: Negative.        Objective:   Physical Exam  Constitutional: He is oriented to person, place, and time. He appears well-developed and well-nourished. No distress.  HENT:  Head: Normocephalic and atraumatic.  Right Ear: External ear normal.  Left Ear: External  ear normal.  Mouth/Throat: Oropharynx is clear and moist. No oropharyngeal exudate.  Nasal congestion bilaterally  Eyes: Conjunctivae and EOM are normal. Pupils are equal, round, and reactive to light. Right eye exhibits no discharge. Left eye exhibits no discharge. No scleral icterus.  Neck: Normal range of motion. Neck supple. No thyromegaly present.  No anterior cervical nodes or carotid bruits  Cardiovascular: Normal rate,  regular rhythm, normal heart sounds and intact distal pulses.  Exam reveals no gallop.   No murmur heard. The heart has a regular rate and rhythm at 72/m  Pulmonary/Chest: Effort normal and breath sounds normal. No respiratory distress. He has no wheezes. He has no rales. He exhibits no tenderness.  Abdominal: Soft. Bowel sounds are normal. He exhibits no mass. There is no tenderness. There is no rebound and no guarding.  The abdomen is obese without masses or tenderness  Musculoskeletal: Normal range of motion. He exhibits edema. He exhibits no tenderness.  Lymphadenopathy:    He has no cervical adenopathy.  Neurological: He is alert and oriented to person, place, and time. He has normal reflexes. No cranial nerve deficit.  Skin: Skin is warm and dry. Rash noted. There is erythema. No pallor.  The patient has bilateral lower leg erythema and rash secondary to stasis dermatitis which is secondary to peripheral venous insufficiency  Psychiatric: He has a normal mood and affect. His behavior is normal. Judgment and thought content normal.  Nursing note and vitals reviewed.  BP 138/71 mmHg  Pulse 88  Temp(Src) 97 F (36.1 C) (Oral)  Ht 5\' 7"  (1.702 m)  Wt 251 lb (113.853 kg)  BMI 39.30 kg/m2        Assessment & Plan:  1. Prostate cancer -Continue yearly follow-up with urology  2. Essential hypertension -Continue current treatment with furosemide  3. Chronic kidney disease, stage III (moderate) -Continue follow-up with nephrology  4. Vitamin D deficiency -Vitamin D level was low and we cannot increase treatment for this because of his elevated creatinine.  5. Elevated blood sugar -Continue to work with diet and exercise regimen and check blood sugars periodically at home if possible  6. Venous insufficiency of both lower extremities -Continue support stockings, consider purchasing some that are above the knee. -& Emollient dressings with saran wrap to lower legs as needed for  dermatitis -Avoid sodium as much as possible in the diet - -Use scent free fabric softeners detergents and soaps   7. Venous stasis dermatitis of both lower extremities - continue treatment as above and follow-up as needed with dermatology   8. Tremor - we will continue to monitor the tremor and if it worsens get back in touch with [name]  - watch caffeine in the diet as you're already doing   9. Cold intolerance -We will check thyroid profile to make sure this is not an issue  Meds ordered this encounter  Medications  . cholecalciferol (VITAMIN D) 1000 UNITS tablet    Sig: Take 1,000 Units by mouth daily.  . tamsulosin (FLOMAX) 0.4 MG CAPS capsule    Sig: Take 1 capsule (0.4 mg total) by mouth daily.    Dispense:  90 capsule    Refill:  1   Patient Instructions                       Medicare Annual Wellness Visit  [facility] and the medical providers at [facility] strive to bring you the best medical care.  In doing so we not only want to address your current medical conditions and concerns but also to detect new conditions early and prevent illness, disease and health-related problems.    Medicare offers a yearly Wellness Visit which allows our clinical staff to assess your need for preventative services including immunizations, lifestyle education, counseling to decrease risk of preventable diseases and screening for fall risk and other medical concerns.    This visit is provided free of charge (no copay) for all Medicare recipients. The clinical pharmacists at [facility] have begun to conduct these Wellness Visits which will also include a thorough review of all your medications.    As you primary medical provider recommend that you make an appointment for your Annual Wellness Visit if you have not done so already this year.  You may set up this appointment before you leave today or you may call back [phone] and schedule an  appointment.  Please make sure when you call that you mention that you are scheduling your Annual Wellness Visit with the clinical pharmacist so that the appointment may be made for the proper length of time.     Continue current medications. Continue good therapeutic lifestyle changes which include good diet and exercise. Fall precautions discussed with patient. If an FOBT was given today- please return it to our front desk. If you are over 49 years old - you may need Prevnar 29 or the adult Pneumonia vaccine.  Flu Shots will be available at our office starting mid- September. Please call and schedule a FLU CLINIC APPOINTMENT.   Avoid fragrance in soaps or fabric softeners and detergent Always wash new clothing before putting it on Continue to watch sodium intake Continue follow-up with nephrology urology and dermatology Watch sodium intake Try above the knee support hose If the tremor worsens we will consider referral to neurology for further evaluation    Arrie Senate MD

## 2014-09-03 NOTE — Patient Instructions (Addendum)
Medicare Annual Wellness Visit  [facility] and the medical providers at [facility] strive to bring you the best medical care.  In doing so we not only want to address your current medical conditions and concerns but also to detect new conditions early and prevent illness, disease and health-related problems.    Medicare offers a yearly Wellness Visit which allows our clinical staff to assess your need for preventative services including immunizations, lifestyle education, counseling to decrease risk of preventable diseases and screening for fall risk and other medical concerns.    This visit is provided free of charge (no copay) for all Medicare recipients. The clinical pharmacists at [facility] have begun to conduct these Wellness Visits which will also include a thorough review of all your medications.    As you primary medical provider recommend that you make an appointment for your Annual Wellness Visit if you have not done so already this year.  You may set up this appointment before you leave today or you may call back [phone] and schedule an appointment.  Please make sure when you call that you mention that you are scheduling your Annual Wellness Visit with the clinical pharmacist so that the appointment may be made for the proper length of time.     Continue current medications. Continue good therapeutic lifestyle changes which include good diet and exercise. Fall precautions discussed with patient. If an FOBT was given today- please return it to our front desk. If you are over 55 years old - you may need Prevnar 70 or the adult Pneumonia vaccine.  Flu Shots will be available at our office starting mid- September. Please call and schedule a FLU CLINIC APPOINTMENT.   Avoid fragrance in soaps or fabric softeners and detergent Always wash new clothing before putting it on Continue to watch sodium intake Continue  follow-up with nephrology urology and dermatology Watch sodium intake Try above the knee support hose If the tremor worsens we will consider referral to neurology for further evaluation

## 2014-09-05 LAB — THYROID PANEL WITH TSH
Free Thyroxine Index: 2.3 (ref 1.2–4.9)
T3 UPTAKE RATIO: 30 % (ref 24–39)
T4, Total: 7.6 ug/dL (ref 4.5–12.0)
TSH: 2.93 u[IU]/mL (ref 0.450–4.500)

## 2014-09-05 LAB — HFP7+LP+2AC
ALT: 17 IU/L (ref 0–44)
AST: 17 IU/L (ref 0–40)
Albumin: 4 g/dL (ref 3.5–4.8)
Alkaline Phosphatase: 63 IU/L (ref 39–117)
Bilirubin, Direct: 0.09 mg/dL (ref 0.00–0.40)
Chol/HDL Ratio: 4.1 ratio units (ref 0.0–5.0)
Cholesterol, Total: 147 mg/dL (ref 100–199)
Estimated CHD Risk: 0.8 times avg. (ref 0.0–1.0)
GGT: 27 IU/L (ref 0–65)
HDL: 36 mg/dL — ABNORMAL LOW (ref >39)
LDH: 137 IU/L (ref 121–224)
LDL CALC: 74 mg/dL (ref 0–99)
LDl/HDL Ratio: 2.1 ratio units (ref 0.0–3.6)
Total Bilirubin: 0.2 mg/dL (ref 0.0–1.2)
Total Protein: 6.2 g/dL (ref 6.0–8.5)
Triglycerides: 183 mg/dL — ABNORMAL HIGH (ref 0–149)
VLDL CHOLESTEROL CAL: 37 mg/dL (ref 5–40)

## 2014-09-05 LAB — SPECIMEN STATUS REPORT

## 2014-09-24 ENCOUNTER — Encounter: Payer: Self-pay | Admitting: *Deleted

## 2014-09-26 DIAGNOSIS — N183 Chronic kidney disease, stage 3 (moderate): Secondary | ICD-10-CM | POA: Diagnosis not present

## 2014-09-26 DIAGNOSIS — D631 Anemia in chronic kidney disease: Secondary | ICD-10-CM | POA: Diagnosis not present

## 2014-09-26 DIAGNOSIS — I129 Hypertensive chronic kidney disease with stage 1 through stage 4 chronic kidney disease, or unspecified chronic kidney disease: Secondary | ICD-10-CM | POA: Diagnosis not present

## 2014-09-26 DIAGNOSIS — N2581 Secondary hyperparathyroidism of renal origin: Secondary | ICD-10-CM | POA: Diagnosis not present

## 2014-10-09 DIAGNOSIS — M1711 Unilateral primary osteoarthritis, right knee: Secondary | ICD-10-CM | POA: Diagnosis not present

## 2014-10-09 DIAGNOSIS — M1712 Unilateral primary osteoarthritis, left knee: Secondary | ICD-10-CM | POA: Diagnosis not present

## 2014-10-17 ENCOUNTER — Ambulatory Visit (INDEPENDENT_AMBULATORY_CARE_PROVIDER_SITE_OTHER): Payer: Medicare Other | Admitting: Family

## 2014-10-17 ENCOUNTER — Encounter: Payer: Self-pay | Admitting: Family

## 2014-10-17 VITALS — BP 151/81 | HR 81 | Temp 97.2°F | Ht 67.0 in | Wt 242.0 lb

## 2014-10-17 DIAGNOSIS — J069 Acute upper respiratory infection, unspecified: Secondary | ICD-10-CM | POA: Diagnosis not present

## 2014-10-17 MED ORDER — METHYLPREDNISOLONE (PAK) 4 MG PO TABS: ORAL_TABLET | ORAL | Status: DC

## 2014-10-17 MED ORDER — AMOXICILLIN-POT CLAVULANATE 875-125 MG PO TABS: 1.0000 | ORAL_TABLET | Freq: Two times a day (BID) | ORAL | Status: DC

## 2014-10-17 MED ORDER — BENZONATATE 200 MG PO CAPS: 200.0000 mg | ORAL_CAPSULE | Freq: Three times a day (TID) | ORAL | Status: DC | PRN

## 2014-10-17 MED ORDER — ALBUTEROL SULFATE HFA 108 (90 BASE) MCG/ACT IN AERS: 2.0000 | INHALATION_SPRAY | Freq: Four times a day (QID) | RESPIRATORY_TRACT | Status: DC | PRN

## 2014-10-17 NOTE — Patient Instructions (Signed)
Upper Respiratory Infection, Adult An upper respiratory infection (URI) is also sometimes known as the common cold. The upper respiratory tract includes the nose, sinuses, throat, trachea, and bronchi. Bronchi are the airways leading to the lungs. Most people improve within 1 week, but symptoms can last up to 2 weeks. A residual cough may last even longer.  CAUSES Many different viruses can infect the tissues lining the upper respiratory tract. The tissues become irritated and inflamed and often become very moist. Mucus production is also common. A cold is contagious. You can easily spread the virus to others by oral contact. This includes kissing, sharing a glass, coughing, or sneezing. Touching your mouth or nose and then touching a surface, which is then touched by another person, can also spread the virus. SYMPTOMS  Symptoms typically develop 1 to 3 days after you come in contact with a cold virus. Symptoms vary from person to person. They may include:  Runny nose.  Sneezing.  Nasal congestion.  Sinus irritation.  Sore throat.  Loss of voice (laryngitis).  Cough.  Fatigue.  Muscle aches.  Loss of appetite.  Headache.  Low-grade fever. DIAGNOSIS  You might diagnose your own cold based on familiar symptoms, since most people get a cold 2 to 3 times a year. Your caregiver can confirm this based on your exam. Most importantly, your caregiver can check that your symptoms are not due to another disease such as strep throat, sinusitis, pneumonia, asthma, or epiglottitis. Blood tests, throat tests, and X-rays are not necessary to diagnose a common cold, but they may sometimes be helpful in excluding other more serious diseases. Your caregiver will decide if any further tests are required. RISKS AND COMPLICATIONS  You may be at risk for a more severe case of the common cold if you smoke cigarettes, have chronic heart disease (such as heart failure) or lung disease (such as asthma), or if  you have a weakened immune system. The very young and very old are also at risk for more serious infections. Bacterial sinusitis, middle ear infections, and bacterial pneumonia can complicate the common cold. The common cold can worsen asthma and chronic obstructive pulmonary disease (COPD). Sometimes, these complications can require emergency medical care and may be life-threatening. PREVENTION  The best way to protect against getting a cold is to practice good hygiene. Avoid oral or hand contact with people with cold symptoms. Wash your hands often if contact occurs. There is no clear evidence that vitamin C, vitamin E, echinacea, or exercise reduces the chance of developing a cold. However, it is always recommended to get plenty of rest and practice good nutrition. TREATMENT  Treatment is directed at relieving symptoms. There is no cure. Antibiotics are not effective, because the infection is caused by a virus, not by bacteria. Treatment may include:  Increased fluid intake. Sports drinks offer valuable electrolytes, sugars, and fluids.  Breathing heated mist or steam (vaporizer or shower).  Eating chicken soup or other clear broths, and maintaining good nutrition.  Getting plenty of rest.  Using gargles or lozenges for comfort.  Controlling fevers with ibuprofen or acetaminophen as directed by your caregiver.  Increasing usage of your inhaler if you have asthma. Zinc gel and zinc lozenges, taken in the first 24 hours of the common cold, can shorten the duration and lessen the severity of symptoms. Pain medicines may help with fever, muscle aches, and throat pain. A variety of non-prescription medicines are available to treat congestion and runny nose. Your caregiver   can make recommendations and may suggest nasal or lung inhalers for other symptoms.  HOME CARE INSTRUCTIONS   Only take over-the-counter or prescription medicines for pain, discomfort, or fever as directed by your  caregiver.  Use a warm mist humidifier or inhale steam from a shower to increase air moisture. This may keep secretions moist and make it easier to breathe.  Drink enough water and fluids to keep your urine clear or pale yellow.  Rest as needed.  Return to work when your temperature has returned to normal or as your caregiver advises. You may need to stay home longer to avoid infecting others. You can also use a face mask and careful hand washing to prevent spread of the virus. SEEK MEDICAL CARE IF:   After the first few days, you feel you are getting worse rather than better.  You need your caregiver's advice about medicines to control symptoms.  You develop chills, worsening shortness of breath, or brown or red sputum. These may be signs of pneumonia.  You develop yellow or brown nasal discharge or pain in the face, especially when you bend forward. These may be signs of sinusitis.  You develop a fever, swollen neck glands, pain with swallowing, or white areas in the back of your throat. These may be signs of strep throat. SEEK IMMEDIATE MEDICAL CARE IF:   You have a fever.  You develop severe or persistent headache, ear pain, sinus pain, or chest pain.  You develop wheezing, a prolonged cough, cough up blood, or have a change in your usual mucus (if you have chronic lung disease).  You develop sore muscles or a stiff neck. Document Released: 01/25/2001 Document Revised: 10/24/2011 Document Reviewed: 11/06/2013 [employer] Patient Information 2015 [employer]. This information is not intended to replace advice given to you by your health care provider. Make sure you discuss any questions you have with your health care provider.  - Take meds as prescribed - Use a cool mist humidifier  -Use saline nose sprays frequently -Saline irrigations of the nose can be very helpful if done frequently.  * 4X daily for 1 week*  * Use of a nettie pot can be helpful with this. Follow  directions with this* -Force fluids -For any cough or congestion  Use plain Mucinex- regular strength or max strength is fine   * Children- consult with Pharmacist for dosing -For fever or aces or pains- take tylenol or ibuprofen appropriate for age and weight.  * for fevers greater than 101 orally you may alternate ibuprofen and tylenol every  3 hours. -Throat lozenges if help   Shawndrea Rutkowski, FNP   

## 2014-10-17 NOTE — Progress Notes (Signed)
Subjective:    Patient ID: Ricardo Mayer, male    DOB: [birth year], 74 y.o.   MRN: [id number]  Cough This is a new problem. The current episode started in the past 7 days (Two days ago). The problem has been unchanged. The problem occurs every few minutes. The cough is productive of sputum. Associated symptoms include nasal congestion, postnasal drip, rhinorrhea and wheezing. Pertinent negatives include no chills, ear congestion, ear pain, fever, headaches, hemoptysis, myalgias, sore throat or shortness of breath. The symptoms are aggravated by lying down. He has tried rest (Mucinex) for the symptoms. The treatment provided mild relief. There is no history of asthma or COPD.      Review of Systems  Constitutional: Negative.  Negative for fever and chills.  HENT: Positive for postnasal drip and rhinorrhea. Negative for ear pain and sore throat.   Respiratory: Positive for cough and wheezing. Negative for hemoptysis and shortness of breath.   Cardiovascular: Negative.   Gastrointestinal: Negative.   Endocrine: Negative.   Genitourinary: Negative.   Musculoskeletal: Negative.  Negative for myalgias.  Neurological: Negative.  Negative for headaches.  Hematological: Negative.   Psychiatric/Behavioral: Negative.   All other systems reviewed and are negative.      Objective:   Physical Exam  Constitutional: He is oriented to person, place, and time. He appears well-developed and well-nourished. No distress.  HENT:  Head: Normocephalic.  Right Ear: External ear normal.  Left Ear: External ear normal.  Mouth/Throat: Oropharynx is clear and moist.  Nasal passage erythemas with mild swelling  Oropharynx erythemas   Eyes: Pupils are equal, round, and reactive to light. Right eye exhibits no discharge. Left eye exhibits no discharge.  Neck: Normal range of motion. Neck supple. No thyromegaly present.  Cardiovascular: Normal rate, regular rhythm, normal heart sounds and intact distal  pulses.   No murmur heard. Pulmonary/Chest: Effort normal. No respiratory distress. He has wheezes.  Abdominal: Soft. Bowel sounds are normal. He exhibits no distension. There is no tenderness.  Musculoskeletal: Normal range of motion. He exhibits no edema or tenderness.  Neurological: He is alert and oriented to person, place, and time. He has normal reflexes. No cranial nerve deficit.  Skin: Skin is warm and dry. No rash noted. No erythema.  Psychiatric: He has a normal mood and affect. His behavior is normal. Judgment and thought content normal.  Vitals reviewed.     BP 151/81 mmHg  Pulse 81  Temp(Src) 97.2 F (36.2 C) (Oral)  Ht 5\' 7"  (1.702 m)  Wt 242 lb (109.77 kg)  BMI 37.89 kg/m2  SpO2 98%      Assessment & Plan:  1. Acute upper respiratory infection -- Take meds as prescribed - Use a cool mist humidifier  -Use saline nose sprays frequently -Saline irrigations of the nose can be very helpful if done frequently.  * 4X daily for 1 week*  * Use of a nettie pot can be helpful with this. Follow directions with this* -Force fluids -For any cough or congestion  Use plain Mucinex- regular strength or max strength is fine   * Children- consult with Pharmacist for dosing -For fever or aces or pains- take tylenol or ibuprofen appropriate for age and weight.  * for fevers greater than 101 orally you may alternate ibuprofen and tylenol every  3 hours. -Throat lozenges if help - amoxicillin-clavulanate (AUGMENTIN) 875-125 MG per tablet; Take 1 tablet by mouth 2 (two) times daily.  Dispense: 14 tablet; Refill: 0 - benzonatate (  TESSALON) 200 MG capsule; Take 1 capsule (200 mg total) by mouth 3 (three) times daily as needed.  Dispense: 30 capsule; Refill: 1 - methylPREDNIsolone (MEDROL DOSPACK) 4 MG tablet; follow package directions  Dispense: 21 tablet; Refill: 0 - albuterol (PROVENTIL HFA;VENTOLIN HFA) 108 (90 BASE) MCG/ACT inhaler; Inhale 2 puffs into the lungs every 6 (six) hours  as needed for wheezing or shortness of breath.  Dispense: 1 Inhaler; Refill: [place], FNP

## 2014-10-27 ENCOUNTER — Other Ambulatory Visit: Payer: Self-pay | Admitting: Family Medicine

## 2014-11-03 LAB — HM DIABETES EYE EXAM

## 2014-11-18 ENCOUNTER — Other Ambulatory Visit: Payer: Self-pay | Admitting: Physician Assistant

## 2014-11-18 DIAGNOSIS — D2262 Melanocytic nevi of left upper limb, including shoulder: Secondary | ICD-10-CM | POA: Diagnosis not present

## 2014-11-18 DIAGNOSIS — L821 Other seborrheic keratosis: Secondary | ICD-10-CM | POA: Diagnosis not present

## 2014-11-18 DIAGNOSIS — D485 Neoplasm of uncertain behavior of skin: Secondary | ICD-10-CM | POA: Diagnosis not present

## 2014-11-28 ENCOUNTER — Encounter: Payer: Self-pay | Admitting: *Deleted

## 2014-12-11 ENCOUNTER — Other Ambulatory Visit: Payer: Self-pay | Admitting: Physician Assistant

## 2014-12-11 DIAGNOSIS — L089 Local infection of the skin and subcutaneous tissue, unspecified: Secondary | ICD-10-CM | POA: Diagnosis not present

## 2014-12-11 DIAGNOSIS — D485 Neoplasm of uncertain behavior of skin: Secondary | ICD-10-CM | POA: Diagnosis not present

## 2014-12-12 ENCOUNTER — Encounter: Payer: Self-pay | Admitting: Family

## 2014-12-12 ENCOUNTER — Ambulatory Visit (INDEPENDENT_AMBULATORY_CARE_PROVIDER_SITE_OTHER): Payer: Medicare Other | Admitting: Family

## 2014-12-12 ENCOUNTER — Ambulatory Visit (INDEPENDENT_AMBULATORY_CARE_PROVIDER_SITE_OTHER): Payer: Medicare Other

## 2014-12-12 VITALS — BP 155/94 | HR 82 | Temp 97.0°F | Ht 67.0 in | Wt 238.0 lb

## 2014-12-12 DIAGNOSIS — M545 Low back pain: Secondary | ICD-10-CM

## 2014-12-12 DIAGNOSIS — K5900 Constipation, unspecified: Secondary | ICD-10-CM

## 2014-12-12 LAB — POCT UA - MICROSCOPIC ONLY
Casts, Ur, LPF, POC: NEGATIVE
Crystals, Ur, HPF, POC: NEGATIVE
MUCUS UA: NEGATIVE
Yeast, UA: NEGATIVE

## 2014-12-12 LAB — POCT URINALYSIS DIPSTICK
BILIRUBIN UA: NEGATIVE
Blood, UA: NEGATIVE
Glucose, UA: NEGATIVE
KETONES UA: NEGATIVE
LEUKOCYTES UA: NEGATIVE
NITRITE UA: NEGATIVE
PH UA: 6
SPEC GRAV UA: 1.015
Urobilinogen, UA: NEGATIVE

## 2014-12-12 MED ORDER — LACTULOSE 20 GM/30ML PO SOLN
15.0000 mL | Freq: Two times a day (BID) | ORAL | Status: DC
Start: 2014-12-12 — End: 2015-05-14

## 2014-12-12 MED ORDER — TRAMADOL HCL 50 MG PO TABS: 50.0000 mg | ORAL_TABLET | Freq: Three times a day (TID) | ORAL | Status: DC | PRN

## 2014-12-12 NOTE — Progress Notes (Signed)
   Subjective:    Patient ID: Ricardo Mayer, male    DOB: Apr 06, 1941, 74 y.o.   MRN: [id number]  Back Pain This is a new problem. The current episode started in the past 7 days (Wednesday night). The problem occurs intermittently. The problem has been waxing and waning since onset. The pain is present in the lumbar spine (Left). The pain is at a severity of 9/10. The pain is severe. The symptoms are aggravated by lying down. Associated symptoms include dysuria. Pertinent negatives include no bladder incontinence, bowel incontinence or fever. Risk factors include history of cancer (Hx of kidney stones). He has tried NSAIDs for the symptoms. The treatment provided mild relief.      Review of Systems  Constitutional: Negative.  Negative for fever.  HENT: Negative.   Respiratory: Negative.   Cardiovascular: Negative.   Gastrointestinal: Negative.  Negative for bowel incontinence.  Endocrine: Negative.   Genitourinary: Positive for dysuria. Negative for bladder incontinence.  Musculoskeletal: Positive for back pain.  Neurological: Negative.   Hematological: Negative.   Psychiatric/Behavioral: Negative.   All other systems reviewed and are negative.      Objective:   Physical Exam  Constitutional: He is oriented to person, place, and time. He appears well-developed and well-nourished. No distress.  HENT:  Head: Normocephalic.  Eyes: Pupils are equal, round, and reactive to light. Right eye exhibits no discharge. Left eye exhibits no discharge.  Neck: Normal range of motion. Neck supple. No thyromegaly present.  Cardiovascular: Normal rate, regular rhythm, normal heart sounds and intact distal pulses.   No murmur heard. Pulmonary/Chest: Effort normal and breath sounds normal. No respiratory distress. He has no wheezes.  Abdominal: Soft. Bowel sounds are normal. He exhibits no distension. There is no tenderness.  Musculoskeletal: Normal range of motion. He exhibits no edema or tenderness.   Neurological: He is alert and oriented to person, place, and time. He has normal reflexes. No cranial nerve deficit.  Skin: Skin is warm and dry. No rash noted. No erythema.  Psychiatric: He has a normal mood and affect. His behavior is normal. Judgment and thought content normal.  Vitals reviewed.   KUB- Large amt of stool in colon Preliminary reading by Evelina Dun, FNP [facility]   BP 155/94 mmHg  Pulse 82  Temp(Src) 97 F (36.1 C) (Oral)  Ht 5\' 7"  (1.702 m)  Wt 238 lb (107.956 kg)  BMI 37.27 kg/m2     Assessment & Plan:  1. Low back pain without sciatica, unspecified back pain laterality - POCT urinalysis dipstick - POCT UA - Microscopic Only - DG Abd 1 View; Future  2. Constipation, unspecified constipation type -Daily Miralax as needed -Force fluids -1 week to recheck back pain - Lactulose 20 GM/30ML SOLN; Take 15 mLs (10 g total) by mouth 2 (two) times daily.  Dispense: 473 mL; Refill: [place], FNP

## 2014-12-12 NOTE — Patient Instructions (Signed)

## 2014-12-16 ENCOUNTER — Ambulatory Visit (INDEPENDENT_AMBULATORY_CARE_PROVIDER_SITE_OTHER): Payer: Medicare Other | Admitting: Family Medicine

## 2014-12-16 ENCOUNTER — Ambulatory Visit (INDEPENDENT_AMBULATORY_CARE_PROVIDER_SITE_OTHER): Payer: Medicare Other

## 2014-12-16 ENCOUNTER — Encounter: Payer: Self-pay | Admitting: Family Medicine

## 2014-12-16 VITALS — BP 93/65 | HR 89 | Temp 97.5°F | Ht 67.0 in | Wt 236.0 lb

## 2014-12-16 DIAGNOSIS — R350 Frequency of micturition: Secondary | ICD-10-CM | POA: Diagnosis not present

## 2014-12-16 DIAGNOSIS — R300 Dysuria: Secondary | ICD-10-CM

## 2014-12-16 DIAGNOSIS — C439 Malignant melanoma of skin, unspecified: Secondary | ICD-10-CM | POA: Diagnosis not present

## 2014-12-16 DIAGNOSIS — M545 Low back pain: Secondary | ICD-10-CM

## 2014-12-16 DIAGNOSIS — C61 Malignant neoplasm of prostate: Secondary | ICD-10-CM | POA: Diagnosis not present

## 2014-12-16 LAB — POCT UA - MICROSCOPIC ONLY
CRYSTALS, UR, HPF, POC: NEGATIVE
Casts, Ur, LPF, POC: NEGATIVE
YEAST UA: NEGATIVE

## 2014-12-16 LAB — POCT URINALYSIS DIPSTICK
Blood, UA: NEGATIVE
Glucose, UA: NEGATIVE
LEUKOCYTES UA: NEGATIVE
NITRITE UA: NEGATIVE
PH UA: 5
Spec Grav, UA: >=1.03
Urobilinogen, UA: NEGATIVE

## 2014-12-16 MED ORDER — DOXYCYCLINE HYCLATE 100 MG PO TABS: 100.0000 mg | ORAL_TABLET | Freq: Two times a day (BID) | ORAL | Status: DC

## 2014-12-16 NOTE — Progress Notes (Signed)
Subjective:    Patient ID: Ricardo Mayer, male    DOB: Apr 15, 1941, 74 y.o.   MRN: [id number]  HPI Patient here today for follow up on back pain. He was seen a few days ago and the pain continues. He states it is worse at night and worse when lying on the left side. The pain is somewhat relieved if he is sitting and raises up his left leg. Even before all of this started 6 days ago he was having some dysuria and frequency. Since he has had the pain he is had more frequency. He did notice some bright red blood in his stool on one occasion. Says he is not constipated. The previous abdominal films that were done did show some degenerative changes in his spine. He has an appointment for a urology visit in June. We will check another 8 urine specimen on him today.         Patient Active Problem List   Diagnosis Date Noted  . Melanoma, history of 10/09/2013  . Hypertension 10/09/2013  . Chronic kidney disease, stage III (moderate) 10/09/2013  . Unspecified venous (peripheral) insufficiency 10/09/2013  . Statin intolerance 10/09/2013  . Prostate cancer 12/25/2012   Outpatient Encounter Prescriptions as of 12/16/2014  Medication Sig  . acetaminophen (TYLENOL) 500 MG tablet Take 500 mg by mouth every 6 (six) hours as needed for pain.  Marland Kitchen albuterol (PROVENTIL HFA;VENTOLIN HFA) 108 (90 BASE) MCG/ACT inhaler Inhale 2 puffs into the lungs every 6 (six) hours as needed for wheezing or shortness of breath.  Marland Kitchen amLODipine-valsartan (EXFORGE) 10-320 MG per tablet TAKE 1 TABLET BY MOUTH DAILY.  Marland Kitchen aspirin 81 MG tablet Take 81 mg by mouth daily.  . cholecalciferol (VITAMIN D) 1000 UNITS tablet Take 1,000 Units by mouth daily.  . fenofibrate 160 MG tablet TAKE 1 TABLET BY MOUTH DAILY  . furosemide (LASIX) 20 MG tablet 20 mg as needed.  . Lactulose 20 GM/30ML SOLN Take 15 mLs (10 g total) by mouth 2 (two) times daily.  . ONE TOUCH ULTRA TEST test strip   . tamsulosin (FLOMAX) 0.4 MG CAPS capsule Take 1  capsule (0.4 mg total) by mouth daily.  . traMADol (ULTRAM) 50 MG tablet Take 1 tablet (50 mg total) by mouth every 8 (eight) hours as needed.   No facility-administered encounter medications on file as of 12/16/2014.     Review of Systems  Constitutional: Negative.   HENT: Negative.   Eyes: Negative.   Respiratory: Negative.   Cardiovascular: Negative.   Gastrointestinal: Negative.   Endocrine: Negative.   Genitourinary: Positive for dysuria and frequency.  Musculoskeletal: Positive for back pain (low).  Skin: Negative.   Allergic/Immunologic: Negative.   Neurological: Negative.   Hematological: Negative.   Psychiatric/Behavioral: Negative.        Objective:   Physical Exam  Constitutional: He is oriented to person, place, and time. He appears well-developed and well-nourished. No distress.  HENT:  Head: Normocephalic and atraumatic.  Eyes: Conjunctivae and EOM are normal. Pupils are equal, round, and reactive to light. Right eye exhibits no discharge. Left eye exhibits no discharge.  Neck: Normal range of motion.  Abdominal: Soft. Bowel sounds are normal. He exhibits no mass. There is no tenderness. There is no rebound and no guarding.  The abdomen is obese without masses or tenderness  Musculoskeletal: Normal range of motion. He exhibits no edema or tenderness.  The patient has good leg raising and hip abduction bilaterally without pain. The patient  is also able to raise either leg against pressure without pain. Once on the table I had him turn to the left and the pain was not reproduced with that move.  Neurological: He is alert and oriented to person, place, and time. He has normal reflexes.  Skin: No rash noted.  Psychiatric: He has a normal mood and affect. His behavior is normal. Thought content normal.  Nursing note and vitals reviewed.  BP 93/65 mmHg  Pulse 89  Temp(Src) 97.5 F (36.4 C) (Oral)  Ht 5\' 7"  (1.702 m)  Wt 236 lb (107.049 kg)  BMI 36.95 kg/m2  [facility]  reading (PRIMARY) by  Dr. Bobbe Medico spine  ----degenerative changes in LS spine and spurring                                Results for orders placed or performed in visit on 12/16/14  POCT urinalysis dipstick  Result Value Ref Range   Color, UA gold    Clarity, UA clear    Glucose, UA neg    Bilirubin, UA small    Ketones, UA trace    Spec Grav, UA >=1.030    Blood, UA neg    pH, UA 5.0    Protein, UA 100+    Urobilinogen, UA negative    Nitrite, UA neg    Leukocytes, UA Negative   POCT UA - Microscopic Only  Result Value Ref Range   WBC, Ur, HPF, POC 5-10    RBC, urine, microscopic 1-3    Bacteria, U Microscopic occ    Mucus, UA moderate    Epithelial cells, urine per micros many    Crystals, Ur, HPF, POC neg    Casts, Ur, LPF, POC neg    Yeast, UA neg          Assessment & Plan:  1. Left low back pain, with sciatica presence unspecified -Continue with pain medicine as needed - DG Lumbar Spine 2-3 Views; Future - POCT urinalysis dipstick - POCT UA - Microscopic Only - Urine culture  2. Dysuria -Continue to drink plenty of fluids  3. Frequency of urination -Continue to drink plenty of fluids  4. Prostate cancer -Follow-up with urologist as planned in June or sooner if we have findings with x-ray that require an earlier visit  5. Pyuria -Doxycycline 100 mg twice daily for 2 weeks  Meds ordered this encounter  Medications  . doxycycline (VIBRA-TABS) 100 MG tablet    Sig: Take 1 tablet (100 mg total) by mouth 2 (two) times daily.    Dispense:  28 tablet    Refill:  0   Patient Instructions  Continue to drink plenty of fluids Continue pain medication as needed We will get urine results and possible urine culture We will also get LS spine films-----we may need an MRI to define the source of the pain. Take Zantac 150 twice daily over-the-counter as needed for belching  before breakfast and supper   Arrie Senate MD

## 2014-12-16 NOTE — Patient Instructions (Addendum)
Continue to drink plenty of fluids Continue pain medication as needed We will get urine results and possible urine culture We will also get LS spine films-----we may need an MRI to define the source of the pain. Take Zantac 150 twice daily over-the-counter as needed for belching  before breakfast and supper

## 2014-12-17 ENCOUNTER — Other Ambulatory Visit: Payer: Self-pay | Admitting: *Deleted

## 2014-12-17 ENCOUNTER — Other Ambulatory Visit: Payer: Self-pay | Admitting: Family Medicine

## 2014-12-17 DIAGNOSIS — M47816 Spondylosis without myelopathy or radiculopathy, lumbar region: Secondary | ICD-10-CM

## 2014-12-17 DIAGNOSIS — M545 Low back pain: Secondary | ICD-10-CM

## 2014-12-18 LAB — URINE CULTURE: Organism ID, Bacteria: NO GROWTH

## 2014-12-21 IMAGING — CR DG CHEST 2V
2 series · 2 of 2 positions shown · non-contrast
Comparison: None.

CLINICAL DATA: Preoperative evaluation for radiation implants.
History of hypertension.  Nonsmoker

CHEST - 2 VIEW

[w chest pa]
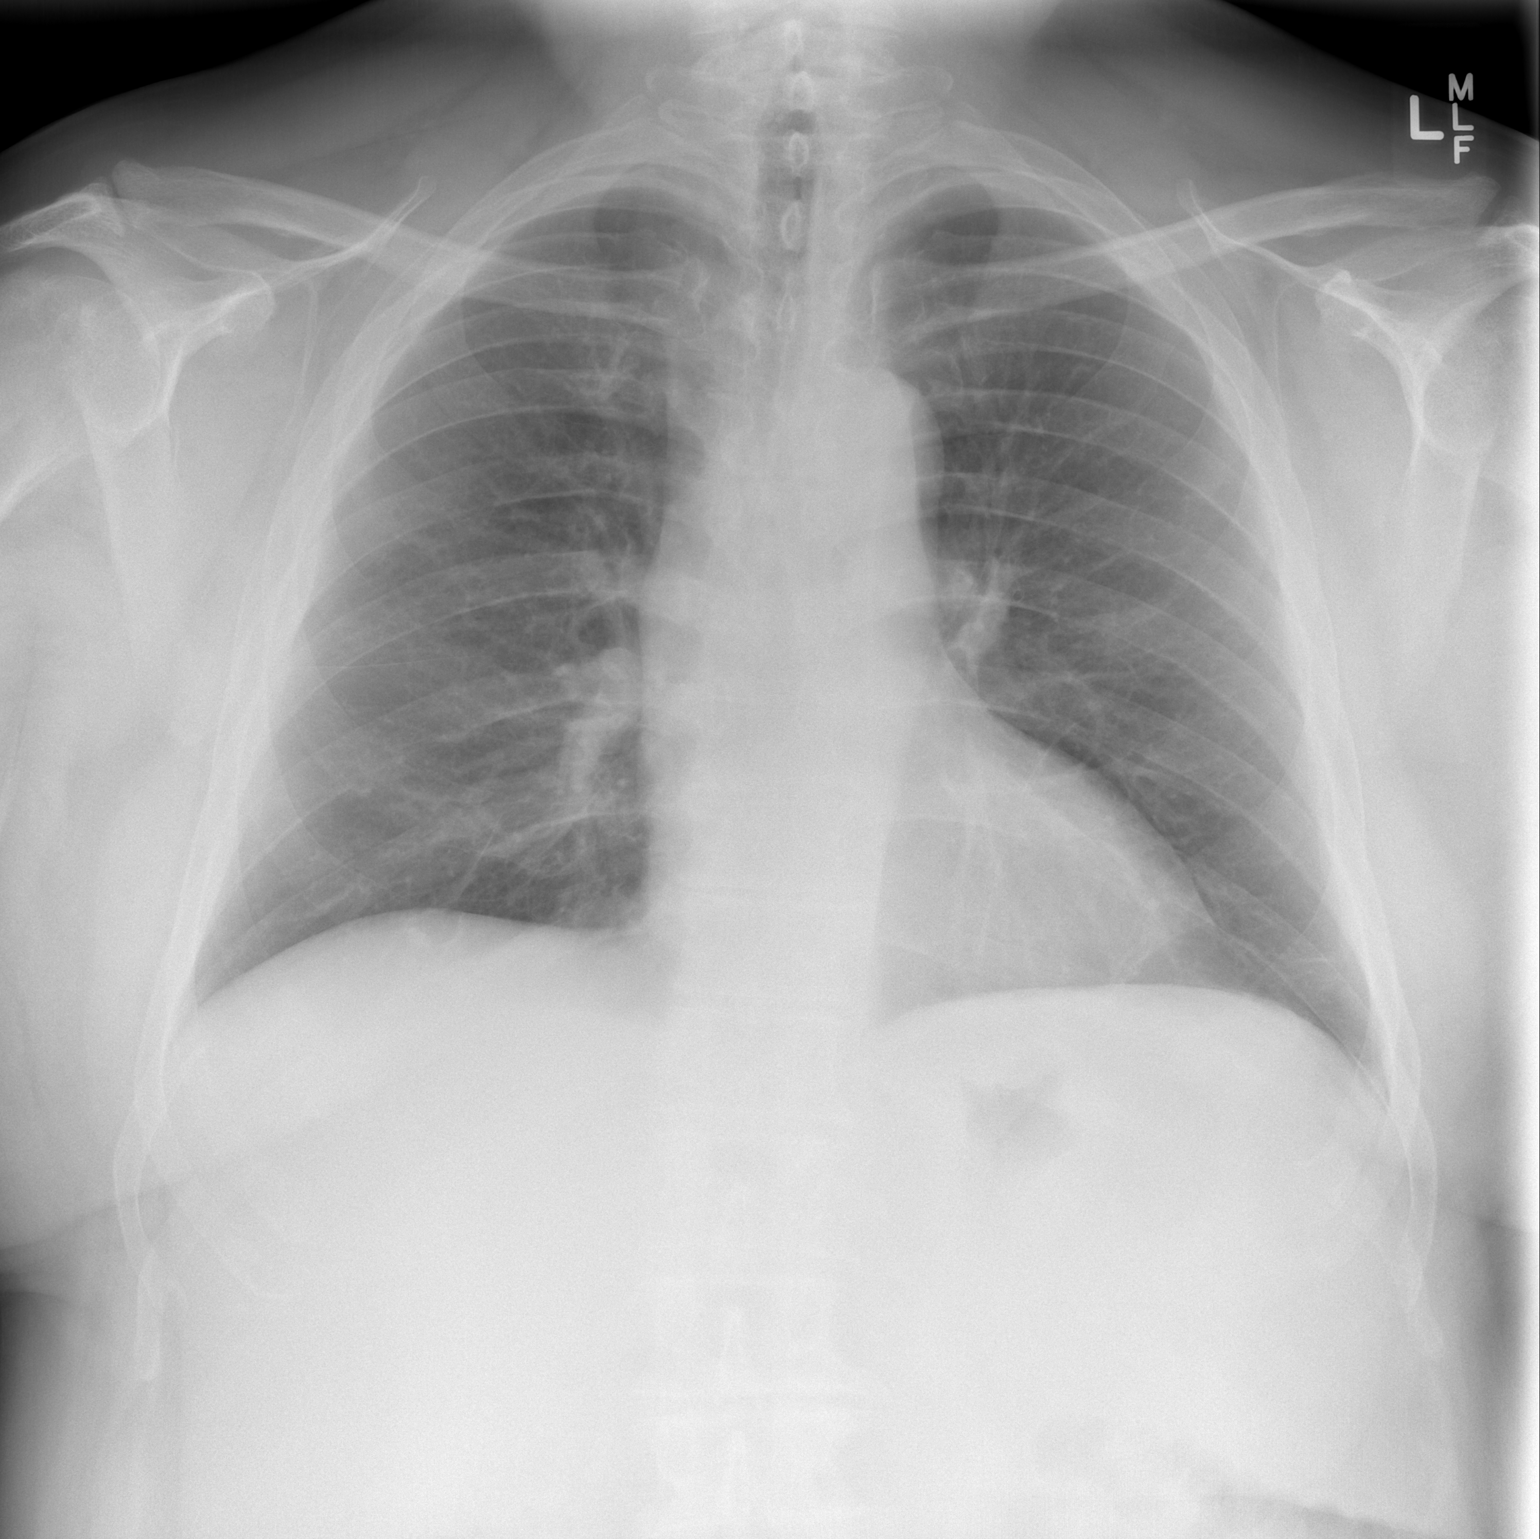

[w chest lat]
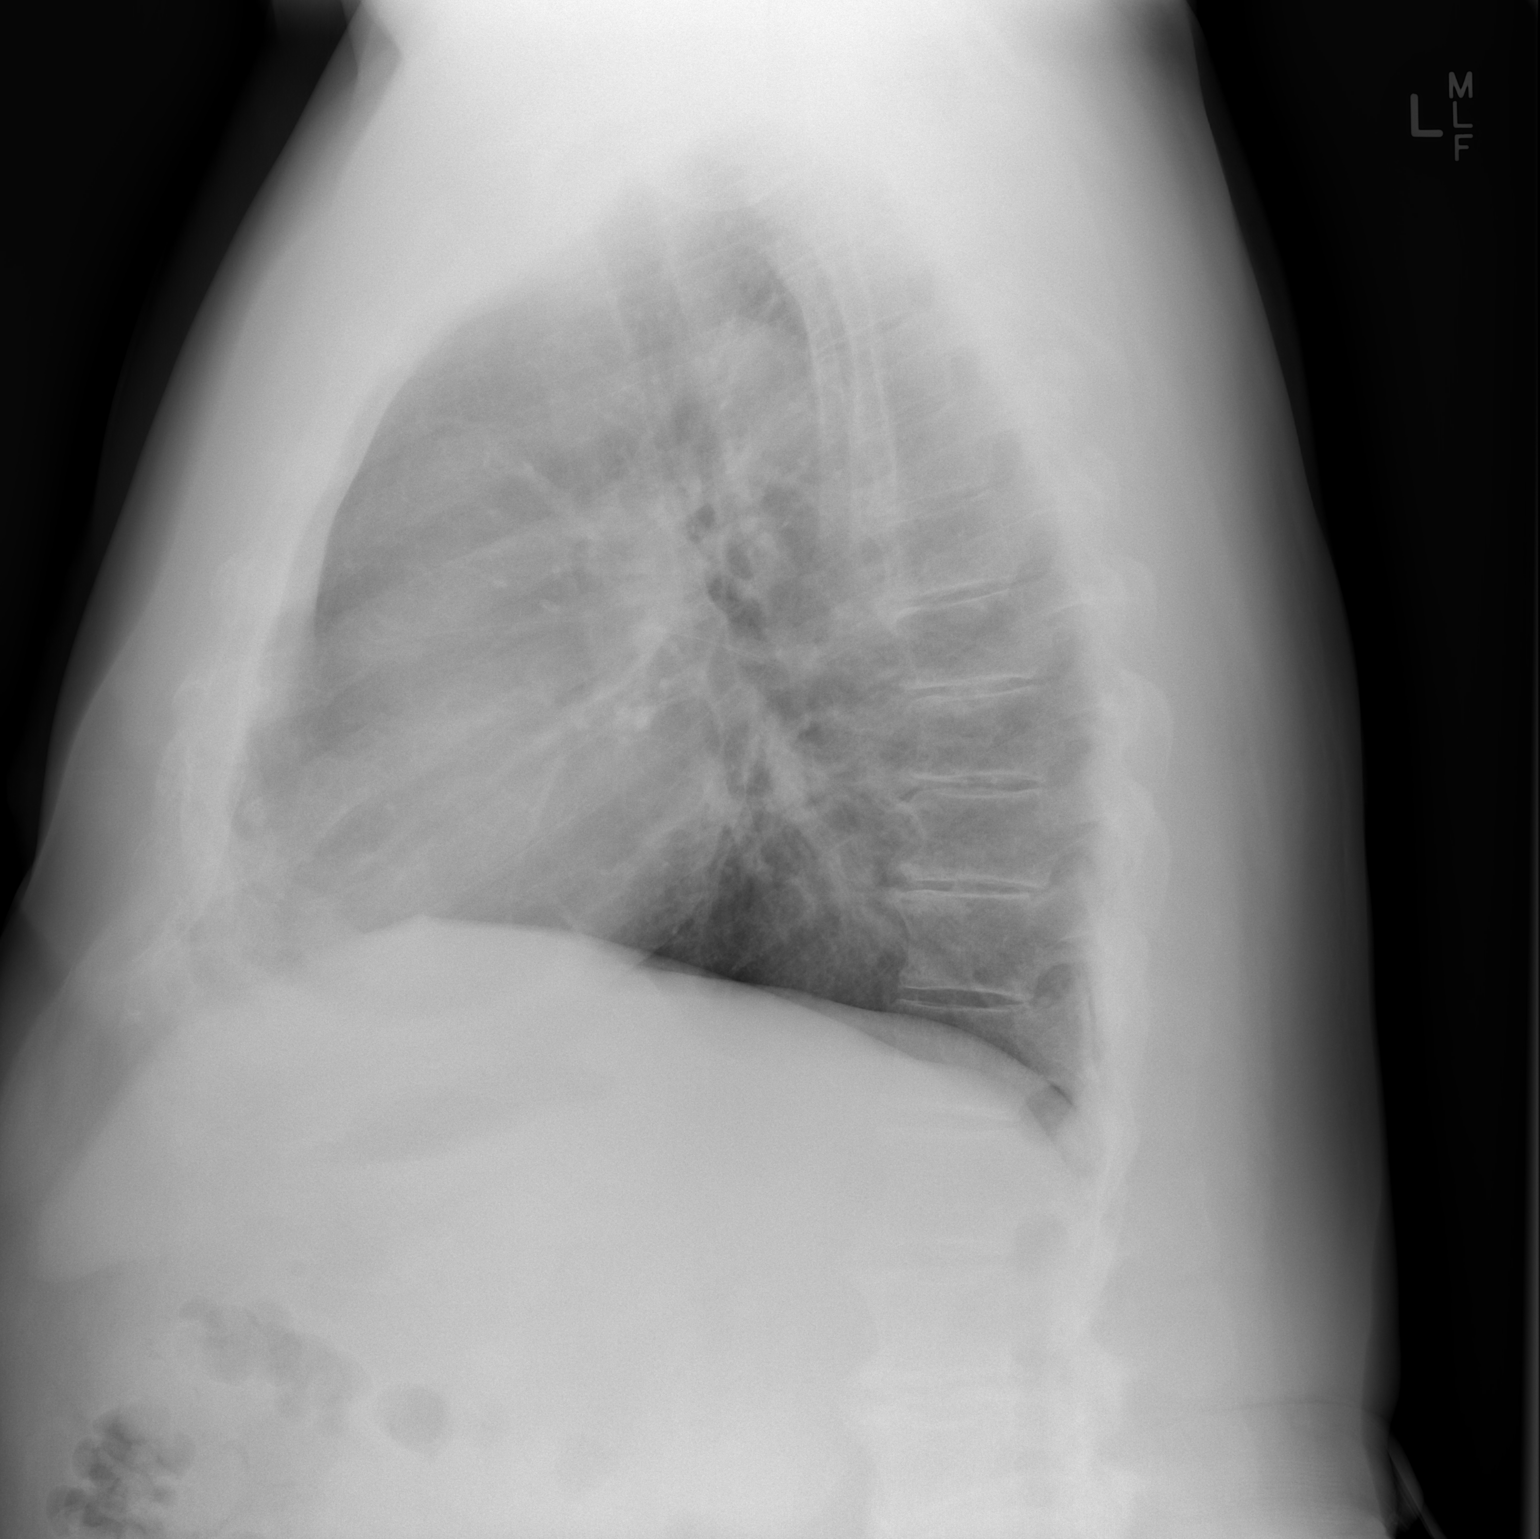

[2 of 2 positions shown; findings below may reference images not displayed]

FINDINGS: Heart and mediastinal contours are within normal limits.
The lung fields are clear with no signs of focal infiltrate or
congestive failure.  No pleural fluid or significant peribronchial
cuffing is seen.

Bony structures appear intact.
IMPRESSION: No worrisome focal or acute cardiopulmonary abnormality seen

## 2014-12-24 ENCOUNTER — Ambulatory Visit (HOSPITAL_COMMUNITY)
Admission: RE | Admit: 2014-12-24 | Discharge: 2014-12-24 | Disposition: A | Discharge status: Home / Self Care | Payer: Medicare Other | Source: Ambulatory Visit | Attending: Family Medicine | Admitting: Family Medicine

## 2014-12-24 DIAGNOSIS — M545 Low back pain: Secondary | ICD-10-CM | POA: Diagnosis not present

## 2014-12-24 DIAGNOSIS — M5136 Other intervertebral disc degeneration, lumbar region: Secondary | ICD-10-CM | POA: Insufficient documentation

## 2014-12-24 DIAGNOSIS — M9973 Connective tissue and disc stenosis of intervertebral foramina of lumbar region: Secondary | ICD-10-CM | POA: Diagnosis not present

## 2014-12-24 DIAGNOSIS — M47816 Spondylosis without myelopathy or radiculopathy, lumbar region: Secondary | ICD-10-CM

## 2014-12-24 DIAGNOSIS — M4806 Spinal stenosis, lumbar region: Secondary | ICD-10-CM | POA: Diagnosis not present

## 2014-12-29 ENCOUNTER — Other Ambulatory Visit: Payer: Self-pay | Admitting: *Deleted

## 2014-12-29 DIAGNOSIS — M48061 Spinal stenosis, lumbar region without neurogenic claudication: Secondary | ICD-10-CM

## 2015-01-08 DIAGNOSIS — M5136 Other intervertebral disc degeneration, lumbar region: Secondary | ICD-10-CM | POA: Diagnosis not present

## 2015-01-15 ENCOUNTER — Ambulatory Visit: Payer: Medicare Other | Admitting: Family Medicine

## 2015-01-19 ENCOUNTER — Ambulatory Visit: Payer: Medicare Other | Admitting: Family Medicine

## 2015-01-20 ENCOUNTER — Other Ambulatory Visit: Payer: Self-pay | Admitting: Family Medicine

## 2015-01-23 DIAGNOSIS — M1712 Unilateral primary osteoarthritis, left knee: Secondary | ICD-10-CM | POA: Diagnosis not present

## 2015-01-23 DIAGNOSIS — M1711 Unilateral primary osteoarthritis, right knee: Secondary | ICD-10-CM | POA: Diagnosis not present

## 2015-01-28 ENCOUNTER — Other Ambulatory Visit (INDEPENDENT_AMBULATORY_CARE_PROVIDER_SITE_OTHER): Payer: Medicare Other

## 2015-01-28 DIAGNOSIS — R739 Hyperglycemia, unspecified: Secondary | ICD-10-CM

## 2015-01-28 DIAGNOSIS — R7309 Other abnormal glucose: Secondary | ICD-10-CM

## 2015-01-28 DIAGNOSIS — E559 Vitamin D deficiency, unspecified: Secondary | ICD-10-CM

## 2015-01-28 DIAGNOSIS — I10 Essential (primary) hypertension: Secondary | ICD-10-CM | POA: Diagnosis not present

## 2015-01-28 LAB — POCT CBC
GRANULOCYTE PERCENT: 69.9 % (ref 37–80)
HCT, POC: 44.1 % (ref 43.5–53.7)
Hemoglobin: 14.3 g/dL (ref 14.1–18.1)
Lymph, poc: 2.2 (ref 0.6–3.4)
MCH: 28.7 pg (ref 27–31.2)
MCHC: 32.5 g/dL (ref 31.8–35.4)
MCV: 88.4 fL (ref 80–97)
MPV: 7.9 fL (ref 0–99.8)
PLATELET COUNT, POC: 176 10*3/uL (ref 142–424)
POC Granulocyte: 5.6 (ref 2–6.9)
POC LYMPH PERCENT: 27.5 %L (ref 10–50)
RBC: 4.99 M/uL (ref 4.69–6.13)
RDW, POC: 14.4 %
WBC: 8 10*3/uL (ref 4.6–10.2)

## 2015-01-28 NOTE — Progress Notes (Signed)
Lab only 

## 2015-01-29 LAB — LIPID PANEL
CHOLESTEROL TOTAL: 150 mg/dL (ref 100–199)
Chol/HDL Ratio: 3.9 ratio units (ref 0.0–5.0)
HDL: 38 mg/dL — AB (ref >39)
LDL CALC: 67 mg/dL (ref 0–99)
Triglycerides: 224 mg/dL — ABNORMAL HIGH (ref 0–149)
VLDL CHOLESTEROL CAL: 45 mg/dL — AB (ref 5–40)

## 2015-01-29 LAB — HEPATIC FUNCTION PANEL
ALT: 13 IU/L (ref 0–44)
AST: 15 IU/L (ref 0–40)
Albumin: 4.1 g/dL (ref 3.5–4.8)
Alkaline Phosphatase: 57 IU/L (ref 39–117)
Bilirubin Total: 0.3 mg/dL (ref 0.0–1.2)
Bilirubin, Direct: 0.08 mg/dL (ref 0.00–0.40)
TOTAL PROTEIN: 6.2 g/dL (ref 6.0–8.5)

## 2015-01-29 LAB — BMP8+EGFR
BUN / CREAT RATIO: 18 (ref 10–22)
BUN: 33 mg/dL — ABNORMAL HIGH (ref 8–27)
CHLORIDE: 103 mmol/L (ref 97–108)
CO2: 21 mmol/L (ref 18–29)
CREATININE: 1.79 mg/dL — AB (ref 0.76–1.27)
Calcium: 9.2 mg/dL (ref 8.6–10.2)
GFR calc non Af Amer: 37 mL/min/{1.73_m2} — ABNORMAL LOW (ref >59)
GFR, EST AFRICAN AMERICAN: 43 mL/min/{1.73_m2} — AB (ref >59)
GLUCOSE: 152 mg/dL — AB (ref 65–99)
Potassium: 4.8 mmol/L (ref 3.5–5.2)
Sodium: 140 mmol/L (ref 134–144)

## 2015-01-29 LAB — VITAMIN D 25 HYDROXY (VIT D DEFICIENCY, FRACTURES): Vit D, 25-Hydroxy: 28.7 ng/mL — ABNORMAL LOW (ref 30.0–100.0)

## 2015-01-30 ENCOUNTER — Ambulatory Visit (INDEPENDENT_AMBULATORY_CARE_PROVIDER_SITE_OTHER): Payer: Medicare Other

## 2015-01-30 ENCOUNTER — Ambulatory Visit (INDEPENDENT_AMBULATORY_CARE_PROVIDER_SITE_OTHER): Payer: Medicare Other | Admitting: Family Medicine

## 2015-01-30 ENCOUNTER — Encounter: Payer: Self-pay | Admitting: Family Medicine

## 2015-01-30 VITALS — BP 132/71 | HR 85 | Temp 97.7°F | Ht 67.0 in | Wt 242.0 lb

## 2015-01-30 DIAGNOSIS — I10 Essential (primary) hypertension: Secondary | ICD-10-CM

## 2015-01-30 DIAGNOSIS — R7309 Other abnormal glucose: Secondary | ICD-10-CM | POA: Diagnosis not present

## 2015-01-30 DIAGNOSIS — C61 Malignant neoplasm of prostate: Secondary | ICD-10-CM | POA: Diagnosis not present

## 2015-01-30 DIAGNOSIS — E785 Hyperlipidemia, unspecified: Secondary | ICD-10-CM | POA: Diagnosis not present

## 2015-01-30 DIAGNOSIS — N183 Chronic kidney disease, stage 3 unspecified: Secondary | ICD-10-CM

## 2015-01-30 DIAGNOSIS — E559 Vitamin D deficiency, unspecified: Secondary | ICD-10-CM | POA: Diagnosis not present

## 2015-01-30 DIAGNOSIS — R739 Hyperglycemia, unspecified: Secondary | ICD-10-CM

## 2015-01-30 LAB — POCT GLYCOSYLATED HEMOGLOBIN (HGB A1C): HEMOGLOBIN A1C: 6.2

## 2015-01-30 NOTE — Progress Notes (Signed)
Subjective:    Patient ID: Ricardo Mayer, male    DOB: 1940/09/01, 74 y.o.   MRN: [id number]  HPI Pt here for follow up and management of chronic medical problems which includes CKD, prostate cancer, and hypertension. He is taking medications regularly. Patient's most recent lab work will be reviewed with him today. The patient denies chest pain or shortness of breath or GI symptoms. He still has incontinence secondary to his prostate cancer and seed implants from the past. He returns to see the urologist next week. 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 His last injection was on a few days before his most recent lab work. The lab work results were as reviewed with him today and the A1c was checked today from that lab work which was 6.2%. This is in the prediabetic range but we will not do any treatment other than to encourage him to watch his diet and get as much exercise as possible. He is statin intolerant and can only take fenofibrate for his cholesterol numbers his actual LDL particle number and LDL C were good and his triglycerides were very elevated. His renal function remains elevated and he sees a nephrologist for this, Dr. Posey Pronto.      Patient Active Problem List   Diagnosis Date Noted  . Melanoma, history of 10/09/2013  . Hypertension 10/09/2013  . Chronic kidney disease, stage III (moderate) 10/09/2013  . Unspecified venous (peripheral) insufficiency 10/09/2013  . Statin intolerance 10/09/2013  . Prostate cancer 12/25/2012   Outpatient Encounter Prescriptions as of 01/30/2015  Medication Sig  . acetaminophen (TYLENOL) 500 MG tablet Take 500 mg by mouth every 6 (six) hours as needed for pain.  Marland Kitchen amLODipine-valsartan (EXFORGE) 10-320 MG per tablet TAKE 1 TABLET BY MOUTH DAILY.  Marland Kitchen aspirin 81 MG tablet Take 81 mg by mouth daily.  . cholecalciferol (VITAMIN D) 1000 UNITS tablet  Take 1,000 Units by mouth daily.  . fenofibrate 160 MG tablet TAKE 1 TABLET BY MOUTH DAILY  . furosemide (LASIX) 20 MG tablet 20 mg as needed.  . Lactulose 20 GM/30ML SOLN Take 15 mLs (10 g total) by mouth 2 (two) times daily.  . ONE TOUCH ULTRA TEST test strip   . tamsulosin (FLOMAX) 0.4 MG CAPS capsule Take 1 capsule (0.4 mg total) by mouth daily.  . [DISCONTINUED] albuterol (PROVENTIL HFA;VENTOLIN HFA) 108 (90 BASE) MCG/ACT inhaler Inhale 2 puffs into the lungs every 6 (six) hours as needed for wheezing or shortness of breath.  . [DISCONTINUED] doxycycline (VIBRA-TABS) 100 MG tablet Take 1 tablet (100 mg total) by mouth 2 (two) times daily.  . [DISCONTINUED] traMADol (ULTRAM) 50 MG tablet Take 1 tablet (50 mg total) by mouth every 8 (eight) hours as needed.   No facility-administered encounter medications on file as of 01/30/2015.     Review of Systems  Constitutional: Negative.   HENT: Negative.   Eyes: Negative.   Respiratory: Negative.   Cardiovascular: Negative.   Gastrointestinal: Negative.   Endocrine: Negative.   Genitourinary: Negative.   Musculoskeletal: Negative.   Skin: Negative.   Allergic/Immunologic: Negative.   Neurological: Negative.   Hematological: Negative.   Psychiatric/Behavioral: Negative.        Objective:   Physical Exam  Constitutional: He is oriented to person, place, and time. He appears well-developed and well-nourished. No distress.  HENT:  Head: Normocephalic and atraumatic.  Right Ear: External ear normal.  Left Ear: External ear normal.  Nose: Nose normal.  Mouth/Throat: Oropharynx is clear and moist. No oropharyngeal exudate.  Eyes: Conjunctivae and EOM are normal. Pupils are equal, round, and reactive to light. Right eye exhibits no discharge. Left eye exhibits no discharge. No scleral icterus.  Neck: Normal range of motion. Neck supple. No thyromegaly present.  Cardiovascular: Normal rate, regular rhythm, normal heart sounds and intact  distal pulses.   No murmur heard. Pulmonary/Chest: Effort normal and breath sounds normal. No respiratory distress. He has no wheezes. He has no rales. He exhibits no tenderness.  Abdominal: Soft. Bowel sounds are normal. He exhibits no mass. There is no tenderness. There is no rebound and no guarding.  Genitourinary: Rectum normal, prostate normal and penis normal.  Musculoskeletal: He exhibits edema. He exhibits no tenderness.  Degenerative changes in the knees affect his mobility with arising and sitting. The patient has some ankle edema bilaterally  Lymphadenopathy:    He has no cervical adenopathy.  Neurological: He is alert and oriented to person, place, and time. No cranial nerve deficit.  Skin: Skin is warm and dry. No rash noted. No erythema. No pallor.  Psychiatric: He has a normal mood and affect. His behavior is normal. Judgment and thought content normal.  Nursing note and vitals reviewed.  BP 132/71 mmHg  Pulse 85  Temp(Src) 97.7 F (36.5 C) (Oral)  Ht 5\' 7"  (1.702 m)  Wt 242 lb (109.77 kg)  BMI 37.89 kg/m2  [facility] reading (PRIMARY) by  Dr.Cris Gibby-x chest x-ray-no active disease                                        Assessment & Plan:  1. Prostate cancer -Patient has follow-up visit with the urologist next week - POCT glycosylated hemoglobin (Hb A1C)  2. Chronic kidney disease, stage III (moderate) -The patient sees Dr. Posey Pronto the nephrologist for this regularly---his creatinine is stable over the past year. - POCT glycosylated hemoglobin (Hb A1C)  3. Essential hypertension --The blood pressure is good and stable he will continue with current treatment - DG Chest 2 View; Future - POCT glycosylated hemoglobin (Hb A1C)  4. Vitamin D deficiency -Vitamin D level was low and this cannot be increased due to his renal insufficiency - POCT glycosylated hemoglobin (Hb A1C)  5. Hyperlipidemia -Patient will continue with his fenofibrate and his most recent triglycerides  were low or than in the past but still elevated. -The patient is statin intolerant and cannot take any statin drugs but his actual LDL C cholesterol was good on the triglycerides were elevated. - DG Chest 2 View; Future - POCT glycosylated hemoglobin (Hb A1C)  6. Elevated blood sugar -The patient has been getting cortisone injections in his knees and his A1c today from the recent lab work was 6.2%. He will continue with as aggressive therapeutic lifestyle changes as his knees will allow him to do and watch his diet as closely as possible.   No orders of the defined types were placed in this encounter.   Patient Instructions                       Medicare Annual Wellness Visit  [facility] and the medical providers at [facility] strive to bring you the best medical care.  In doing so we not only want to address your current medical conditions  and concerns but also to detect new conditions early and prevent illness, disease and health-related problems.    Medicare offers a yearly Wellness Visit which allows our clinical staff to assess your need for preventative services including immunizations, lifestyle education, counseling to decrease risk of preventable diseases and screening for fall risk and other medical concerns.    This visit is provided free of charge (no copay) for all Medicare recipients. The clinical pharmacists at [facility] have begun to conduct these Wellness Visits which will also include a thorough review of all your medications.    As you primary medical provider recommend that you make an appointment for your Annual Wellness Visit if you have not done so already this year.  You may set up this appointment before you leave today or you may call back [phone] and schedule an appointment.  Please make sure when you call that you mention that you are scheduling your Annual Wellness Visit with the clinical pharmacist so that the  appointment may be made for the proper length of time.     Continue current medications. Continue good therapeutic lifestyle changes which include good diet and exercise. Fall precautions discussed with patient. If an FOBT was given today- please return it to our front desk. If you are over 48 years old - you may need Prevnar 53 or the adult Pneumonia vaccine.  Flu Shots are still available at our office. If you still haven't had one please call to set up a nurse visit to get one.   After your visit with [name] today you will receive a survey in the mail or online from [employer] regarding your care with [name]. Please take a moment to fill this out. Your feedback is very important to [name] as you can help [name] better understand your patient needs as well as improve your experience and satisfaction. WE CARE ABOUT YOU!!!   Continue to follow-up with nephrology Please discuss with Dr. Jeffie Pollock the back pain that you had recently and take a copy of the lab work with you to that visit Try to check blood sugars at home periodically Stay as active as possible Watch diet as closely as possible   Arrie Senate MD

## 2015-01-30 NOTE — Patient Instructions (Addendum)
Medicare Annual Wellness Visit  [facility] and the medical providers at [facility] strive to bring you the best medical care.  In doing so we not only want to address your current medical conditions and concerns but also to detect new conditions early and prevent illness, disease and health-related problems.    Medicare offers a yearly Wellness Visit which allows our clinical staff to assess your need for preventative services including immunizations, lifestyle education, counseling to decrease risk of preventable diseases and screening for fall risk and other medical concerns.    This visit is provided free of charge (no copay) for all Medicare recipients. The clinical pharmacists at [facility] have begun to conduct these Wellness Visits which will also include a thorough review of all your medications.    As you primary medical provider recommend that you make an appointment for your Annual Wellness Visit if you have not done so already this year.  You may set up this appointment before you leave today or you may call back [phone] and schedule an appointment.  Please make sure when you call that you mention that you are scheduling your Annual Wellness Visit with the clinical pharmacist so that the appointment may be made for the proper length of time.     Continue current medications. Continue good therapeutic lifestyle changes which include good diet and exercise. Fall precautions discussed with patient. If an FOBT was given today- please return it to our front desk. If you are over 84 years old - you may need Prevnar 26 or the adult Pneumonia vaccine.  Flu Shots are still available at our office. If you still haven't had one please call to set up a nurse visit to get one.   After your visit with [name] today you will receive a survey in the mail or online from [employer] regarding your care with [name]. Please take a moment to  fill this out. Your feedback is very important to [name] as you can help [name] better understand your patient needs as well as improve your experience and satisfaction. WE CARE ABOUT YOU!!!   Continue to follow-up with nephrology Please discuss with Dr. Jeffie Pollock the back pain that you had recently and take a copy of the lab work with you to that visit Try to check blood sugars at home periodically Stay as active as possible Watch diet as closely as possible

## 2015-02-02 DIAGNOSIS — N3941 Urge incontinence: Secondary | ICD-10-CM | POA: Diagnosis not present

## 2015-02-02 DIAGNOSIS — C61 Malignant neoplasm of prostate: Secondary | ICD-10-CM | POA: Diagnosis not present

## 2015-02-02 DIAGNOSIS — R350 Frequency of micturition: Secondary | ICD-10-CM | POA: Diagnosis not present

## 2015-03-03 DIAGNOSIS — R350 Frequency of micturition: Secondary | ICD-10-CM | POA: Diagnosis not present

## 2015-03-03 DIAGNOSIS — N3941 Urge incontinence: Secondary | ICD-10-CM | POA: Diagnosis not present

## 2015-03-05 ENCOUNTER — Encounter: Payer: Self-pay | Admitting: *Deleted

## 2015-04-14 ENCOUNTER — Other Ambulatory Visit: Payer: Self-pay | Admitting: Physician Assistant

## 2015-04-14 DIAGNOSIS — L570 Actinic keratosis: Secondary | ICD-10-CM | POA: Diagnosis not present

## 2015-04-14 DIAGNOSIS — C44519 Basal cell carcinoma of skin of other part of trunk: Secondary | ICD-10-CM | POA: Diagnosis not present

## 2015-04-15 ENCOUNTER — Other Ambulatory Visit (INDEPENDENT_AMBULATORY_CARE_PROVIDER_SITE_OTHER): Payer: Medicare Other

## 2015-04-15 DIAGNOSIS — N183 Chronic kidney disease, stage 3 unspecified: Secondary | ICD-10-CM

## 2015-04-15 DIAGNOSIS — D631 Anemia in chronic kidney disease: Secondary | ICD-10-CM

## 2015-04-15 DIAGNOSIS — N2581 Secondary hyperparathyroidism of renal origin: Secondary | ICD-10-CM

## 2015-04-15 NOTE — Progress Notes (Signed)
Lab for Dr Elmarie Shiley Renal, Magnesium, PR/CR Ratio, Urinalysis, Vitamin D, CBC DX: N18.9, N18.3, N25.81

## 2015-04-16 LAB — RENAL FUNCTION PANEL
Albumin: 4.2 g/dL (ref 3.5–4.8)
BUN / CREAT RATIO: 19 (ref 10–22)
BUN: 38 mg/dL — AB (ref 8–27)
CALCIUM: 9.6 mg/dL (ref 8.6–10.2)
CO2: 22 mmol/L (ref 18–29)
Chloride: 100 mmol/L (ref 97–108)
Creatinine, Ser: 2.02 mg/dL — ABNORMAL HIGH (ref 0.76–1.27)
GFR calc Af Amer: 36 mL/min/{1.73_m2} — ABNORMAL LOW (ref >59)
GFR calc non Af Amer: 32 mL/min/{1.73_m2} — ABNORMAL LOW (ref >59)
GLUCOSE: 145 mg/dL — AB (ref 65–99)
POTASSIUM: 5 mmol/L (ref 3.5–5.2)
Phosphorus: 3.2 mg/dL (ref 2.5–4.5)
Sodium: 138 mmol/L (ref 134–144)

## 2015-04-16 LAB — MICROSCOPIC EXAMINATION: CASTS: NONE SEEN /LPF

## 2015-04-16 LAB — CBC WITH DIFFERENTIAL/PLATELET
Basophils Absolute: 0 10*3/uL (ref 0.0–0.2)
Basos: 0 %
EOS (ABSOLUTE): 0.1 10*3/uL (ref 0.0–0.4)
Eos: 1 %
HEMOGLOBIN: 14.7 g/dL (ref 12.6–17.7)
Hematocrit: 43 % (ref 37.5–51.0)
IMMATURE GRANS (ABS): 0 10*3/uL (ref 0.0–0.1)
IMMATURE GRANULOCYTES: 0 %
Lymphocytes Absolute: 2.5 10*3/uL (ref 0.7–3.1)
Lymphs: 31 %
MCH: 30 pg (ref 26.6–33.0)
MCHC: 34.2 g/dL (ref 31.5–35.7)
MCV: 88 fL (ref 79–97)
MONOCYTES: 7 %
Monocytes Absolute: 0.6 10*3/uL (ref 0.1–0.9)
NEUTROS PCT: 61 %
Neutrophils Absolute: 4.7 10*3/uL (ref 1.4–7.0)
Platelets: 169 10*3/uL (ref 150–379)
RBC: 4.9 x10E6/uL (ref 4.14–5.80)
RDW: 14.8 % (ref 12.3–15.4)
WBC: 7.9 10*3/uL (ref 3.4–10.8)

## 2015-04-16 LAB — PROTEIN / CREATININE RATIO, URINE
Creatinine, Urine: 115.8 mg/dL
PROTEIN/CREAT RATIO: 211 mg/g{creat} — AB (ref 0–200)
Protein, Ur: 24.4 mg/dL

## 2015-04-16 LAB — URINALYSIS, COMPLETE
Bilirubin, UA: NEGATIVE
Glucose, UA: NEGATIVE
Ketones, UA: NEGATIVE
Leukocytes, UA: NEGATIVE
Nitrite, UA: NEGATIVE
PH UA: 5.5 (ref 5.0–7.5)
RBC UA: NEGATIVE
Specific Gravity, UA: 1.018 (ref 1.005–1.030)
UUROB: 0.2 mg/dL (ref 0.2–1.0)

## 2015-04-16 LAB — VITAMIN D 25 HYDROXY (VIT D DEFICIENCY, FRACTURES): Vit D, 25-Hydroxy: 35.3 ng/mL (ref 30.0–100.0)

## 2015-04-16 LAB — MAGNESIUM: MAGNESIUM: 2 mg/dL (ref 1.6–2.3)

## 2015-04-23 DIAGNOSIS — N2581 Secondary hyperparathyroidism of renal origin: Secondary | ICD-10-CM | POA: Diagnosis not present

## 2015-04-23 DIAGNOSIS — D631 Anemia in chronic kidney disease: Secondary | ICD-10-CM | POA: Diagnosis not present

## 2015-04-23 DIAGNOSIS — N183 Chronic kidney disease, stage 3 (moderate): Secondary | ICD-10-CM | POA: Diagnosis not present

## 2015-04-23 DIAGNOSIS — I129 Hypertensive chronic kidney disease with stage 1 through stage 4 chronic kidney disease, or unspecified chronic kidney disease: Secondary | ICD-10-CM | POA: Diagnosis not present

## 2015-04-28 DIAGNOSIS — M170 Bilateral primary osteoarthritis of knee: Secondary | ICD-10-CM | POA: Diagnosis not present

## 2015-05-07 ENCOUNTER — Encounter: Payer: Self-pay | Admitting: Physician Assistant

## 2015-05-07 ENCOUNTER — Other Ambulatory Visit: Payer: Medicare Other

## 2015-05-07 ENCOUNTER — Ambulatory Visit (INDEPENDENT_AMBULATORY_CARE_PROVIDER_SITE_OTHER): Payer: Medicare Other | Admitting: Physician Assistant

## 2015-05-07 VITALS — BP 139/79 | HR 69 | Temp 96.7°F | Ht 67.0 in | Wt 241.4 lb

## 2015-05-07 DIAGNOSIS — L97929 Non-pressure chronic ulcer of unspecified part of left lower leg with unspecified severity: Secondary | ICD-10-CM | POA: Diagnosis not present

## 2015-05-07 DIAGNOSIS — N183 Chronic kidney disease, stage 3 (moderate): Secondary | ICD-10-CM | POA: Diagnosis not present

## 2015-05-07 MED ORDER — SULFAMETHOXAZOLE-TRIMETHOPRIM 800-160 MG PO TABS: 1.0000 | ORAL_TABLET | Freq: Two times a day (BID) | ORAL | Status: DC

## 2015-05-07 NOTE — Progress Notes (Signed)
Lab only 

## 2015-05-07 NOTE — Progress Notes (Signed)
   Subjective:    Patient ID: Raizo Cazes, male    DOB: [birth year], 74 y.o.   MRN: [id number]  HPI 74 y/o male nake oresebts with sore on left leg. He has a h/o venous insufficiency. He states that he normally controls it with moisturizer but the last week there has been an indention on his left leg that is painful. He wears copper compression hose daily.     Review of Systems  Constitutional: Negative.   HENT: Negative.   Skin:       LE edema and pain on left lateral leg with indention and drainage        Objective:   Physical Exam  Constitutional: He is oriented to person, place, and time. He appears well-developed and well-nourished. No distress.  Abdominal:  Obese   Musculoskeletal: He exhibits edema (chronic peripheral edema bilaterally ).  Neurological: He is alert and oriented to person, place, and time.  Skin: He is not diaphoretic.  Brown staining of BLE with 2 .106mm-1cm ulcerative lesions with yellow crusting, surrounding erythema TTP on left lateral lower leg ( calf area)   Nursing note and vitals reviewed.         Assessment & Plan:  1. Ulcer of left lower leg, with unspecified severity  - Aerobic culture - Apply unna boot - sulfamethoxazole-trimethoprim (BACTRIM DS,SEPTRA DS) 800-160 MG per tablet; Take 1 tablet by mouth 2 (two) times daily.  Dispense: 20 tablet; Refill: 0   Continue all meds  RTO 1 week   Tiffany A. Benjamin Stain PA-C

## 2015-05-09 LAB — AEROBIC CULTURE

## 2015-05-14 ENCOUNTER — Ambulatory Visit (INDEPENDENT_AMBULATORY_CARE_PROVIDER_SITE_OTHER): Payer: Medicare Other | Admitting: Physician Assistant

## 2015-05-14 ENCOUNTER — Other Ambulatory Visit: Payer: Self-pay | Admitting: Nephrology

## 2015-05-14 ENCOUNTER — Encounter: Payer: Self-pay | Admitting: Physician Assistant

## 2015-05-14 ENCOUNTER — Other Ambulatory Visit: Payer: Self-pay | Admitting: Family Medicine

## 2015-05-14 VITALS — BP 135/74 | HR 70 | Ht 67.0 in | Wt 239.0 lb

## 2015-05-14 DIAGNOSIS — N183 Chronic kidney disease, stage 3 unspecified: Secondary | ICD-10-CM

## 2015-05-14 DIAGNOSIS — N179 Acute kidney failure, unspecified: Secondary | ICD-10-CM

## 2015-05-14 DIAGNOSIS — L97929 Non-pressure chronic ulcer of unspecified part of left lower leg with unspecified severity: Secondary | ICD-10-CM

## 2015-05-14 DIAGNOSIS — I10 Essential (primary) hypertension: Secondary | ICD-10-CM | POA: Diagnosis not present

## 2015-05-14 LAB — POCT GLYCOSYLATED HEMOGLOBIN (HGB A1C): Hemoglobin A1C: 7

## 2015-05-14 MED ORDER — MUPIROCIN 2 % EX OINT: TOPICAL_OINTMENT | CUTANEOUS | Status: DC

## 2015-05-14 NOTE — Patient Instructions (Signed)
Apply ointment twice daily under nonstick gauze

## 2015-05-14 NOTE — Addendum Note (Signed)
Addended by: Earlene Plater on: 05/14/2015 09:01 AM   Modules accepted: Orders

## 2015-05-14 NOTE — Progress Notes (Signed)
Patient ID: Ricardo Mayer, male   DOB: Oct 13, 1940, 74 y.o.   MRN: [id number]  74 y/o male presents for follow up of ulcer on LLE. He has worn an [employer] over the past week.   PE reveals much improved healing of wound on LLE. 2 1-13mm ulcers remain with surrounding scale. PIH from peripheral edema   1. Ulcer of left lower leg, with unspecified severity - Apply nonstick gauze over mupirocin ointment - mupirocin ointment (BACTROBAN) 2 %; Apply to AA BID x 14 days  Dispense: 30 g; Refill: 0  F/U if ulcer worsens prior to follow up  Continue all meds   Tiffany A. Benjamin Stain PA-C

## 2015-05-15 ENCOUNTER — Ambulatory Visit
Admission: RE | Admit: 2015-05-15 | Discharge: 2015-05-15 | Disposition: A | Discharge status: Home / Self Care | Payer: Medicare Other | Source: Ambulatory Visit | Attending: Nephrology | Admitting: Nephrology

## 2015-05-15 DIAGNOSIS — N179 Acute kidney failure, unspecified: Secondary | ICD-10-CM | POA: Diagnosis not present

## 2015-05-15 LAB — BMP8+EGFR
BUN/Creatinine Ratio: 14 (ref 10–22)
BUN: 31 mg/dL — ABNORMAL HIGH (ref 8–27)
CALCIUM: 10.1 mg/dL (ref 8.6–10.2)
CO2: 20 mmol/L (ref 18–29)
Chloride: 103 mmol/L (ref 97–108)
Creatinine, Ser: 2.2 mg/dL — ABNORMAL HIGH (ref 0.76–1.27)
GFR calc Af Amer: 33 mL/min/{1.73_m2} — ABNORMAL LOW (ref >59)
GFR, EST NON AFRICAN AMERICAN: 28 mL/min/{1.73_m2} — AB (ref >59)
Glucose: 132 mg/dL — ABNORMAL HIGH (ref 65–99)
Potassium: 5.3 mmol/L — ABNORMAL HIGH (ref 3.5–5.2)
Sodium: 138 mmol/L (ref 134–144)

## 2015-05-15 LAB — HEPATIC FUNCTION PANEL
ALBUMIN: 4.3 g/dL (ref 3.5–4.8)
ALT: 16 IU/L (ref 0–44)
AST: 19 IU/L (ref 0–40)
Alkaline Phosphatase: 58 IU/L (ref 39–117)
BILIRUBIN TOTAL: 0.3 mg/dL (ref 0.0–1.2)
Bilirubin, Direct: 0.09 mg/dL (ref 0.00–0.40)
Total Protein: 6.5 g/dL (ref 6.0–8.5)

## 2015-05-15 LAB — LIPID PANEL
Chol/HDL Ratio: 3.7 ratio units (ref 0.0–5.0)
Cholesterol, Total: 140 mg/dL (ref 100–199)
HDL: 38 mg/dL — ABNORMAL LOW (ref >39)
LDL Calculated: 68 mg/dL (ref 0–99)
Triglycerides: 168 mg/dL — ABNORMAL HIGH (ref 0–149)
VLDL CHOLESTEROL CAL: 34 mg/dL (ref 5–40)

## 2015-05-15 LAB — VITAMIN D 25 HYDROXY (VIT D DEFICIENCY, FRACTURES): VIT D 25 HYDROXY: 31.4 ng/mL (ref 30.0–100.0)

## 2015-05-16 ENCOUNTER — Other Ambulatory Visit: Payer: Self-pay | Admitting: Family Medicine

## 2015-05-19 ENCOUNTER — Encounter: Payer: Self-pay | Admitting: Family Medicine

## 2015-05-19 ENCOUNTER — Ambulatory Visit (INDEPENDENT_AMBULATORY_CARE_PROVIDER_SITE_OTHER): Payer: Medicare Other | Admitting: Family Medicine

## 2015-05-19 VITALS — BP 143/74 | HR 72 | Temp 96.5°F | Ht 67.0 in | Wt 239.0 lb

## 2015-05-19 DIAGNOSIS — E1151 Type 2 diabetes mellitus with diabetic peripheral angiopathy without gangrene: Secondary | ICD-10-CM

## 2015-05-19 DIAGNOSIS — E785 Hyperlipidemia, unspecified: Secondary | ICD-10-CM | POA: Diagnosis not present

## 2015-05-19 DIAGNOSIS — E559 Vitamin D deficiency, unspecified: Secondary | ICD-10-CM

## 2015-05-19 DIAGNOSIS — N183 Chronic kidney disease, stage 3 unspecified: Secondary | ICD-10-CM

## 2015-05-19 DIAGNOSIS — E1159 Type 2 diabetes mellitus with other circulatory complications: Secondary | ICD-10-CM | POA: Diagnosis not present

## 2015-05-19 DIAGNOSIS — I10 Essential (primary) hypertension: Secondary | ICD-10-CM | POA: Diagnosis not present

## 2015-05-19 DIAGNOSIS — I83029 Varicose veins of left lower extremity with ulcer of unspecified site: Secondary | ICD-10-CM | POA: Diagnosis not present

## 2015-05-19 DIAGNOSIS — C61 Malignant neoplasm of prostate: Secondary | ICD-10-CM

## 2015-05-19 DIAGNOSIS — I70209 Unspecified atherosclerosis of native arteries of extremities, unspecified extremity: Secondary | ICD-10-CM | POA: Diagnosis not present

## 2015-05-19 MED ORDER — SITAGLIPTIN PHOSPHATE 50 MG PO TABS: 50.0000 mg | ORAL_TABLET | Freq: Every day | ORAL | Status: DC

## 2015-05-19 NOTE — Patient Instructions (Addendum)
Medicare Annual Wellness Visit  [facility] and the medical providers at [facility] strive to bring you the best medical care.  In doing so we not only want to address your current medical conditions and concerns but also to detect new conditions early and prevent illness, disease and health-related problems.    Medicare offers a yearly Wellness Visit which allows our clinical staff to assess your need for preventative services including immunizations, lifestyle education, counseling to decrease risk of preventable diseases and screening for fall risk and other medical concerns.    This visit is provided free of charge (no copay) for all Medicare recipients. The clinical pharmacists at [facility] have begun to conduct these Wellness Visits which will also include a thorough review of all your medications.    As you primary medical provider recommend that you make an appointment for your Annual Wellness Visit if you have not done so already this year.  You may set up this appointment before you leave today or you may call back [phone] and schedule an appointment.  Please make sure when you call that you mention that you are scheduling your Annual Wellness Visit with the clinical pharmacist so that the appointment may be made for the proper length of time.     Continue current medications. Continue good therapeutic lifestyle changes which include good diet and exercise. Fall precautions discussed with patient. If an FOBT was given today- please return it to our front desk. If you are over 27 years old - you may need Prevnar 42 or the adult Pneumonia vaccine.  **Flu shots will be available soon--- please call and schedule a FLU-CLINIC appointment**  After your visit with [name] today you will receive a survey in the mail or online from [employer] regarding your care with [name]. Please take a moment to fill this out. Your feedback is  very important to [name] as you can help [name] better understand your patient needs as well as improve your experience and satisfaction. WE CARE ABOUT YOU!!!    I did speak with Dr. Posey Pronto today and he agreed that we should start Januvia 50 mg 1 daily to help better control of the blood sugar We will schedule follow-up visit with the clinical pharmacist to discuss future blood sugar readings and better control with diet and exercise Dr. Posey Pronto will be calling you about your ultrasound as I indicated We will schedule you with the wound clinic to follow-up on this ulcer on the left leg Use the saline soaks 20 minutes 3 or 4 times daily and cleansing with Betadine solution and applying mupirocin ointment Watch her diet more closely We will also be checking a potassium today and we will let Dr. Posey Pronto know the results of this. Continue to follow-up with urology for prostate cancer Continue to follow-up with dermatology for skin cancer

## 2015-05-19 NOTE — Progress Notes (Signed)
Subjective:    Patient ID: Ricardo Mayer, male    DOB: [birth year], 74 y.o.   MRN: [id number]  HPI Pt here for follow up and management of chronic medical problems which includes hypertension, kidney disease and hyperlipidemia. He is taking medications regularly. Patient is doing well today and comes in today for his regular visit plus follow-up on the area of infection on his left leg. He sees the urologist regularly. He is on he had his lab work done and we will review this with him during the visit today. This patient recently had an ultrasound of the kidney and bladder area to rule out obstruction by Dr. Posey Pronto and after speaking with Dr. Posey Pronto today this study was negative. Also a more recent creatinine and Dr. Serita Grit office was higher than the one that we saw in the office today. Dr. Posey Pronto was informed of the hemoglobin A1c her conversation with him today and it was his recommendation that we put the patient on Januvia 50 mg daily and this was also discussed with the patient. Because of the persistent ulcer in the left lower extremity he will be scheduled to follow-up with the wound clinic where he has been in the past to make sure this heals completely. The patient today denies chest pain or shortness of breath trouble swallowing heartburn indigestion nausea vomiting diarrhea or blood in the stool. He is in fact very calm and very collected despite all of these things going on. He is up to date with his visits with the urologist for prostate cancer. He does have further follow-up scheduled for skin cancers on his arm.     Patient Active Problem List   Diagnosis Date Noted  . Melanoma, history of 10/09/2013  . Hypertension 10/09/2013  . Chronic kidney disease, stage III (moderate) 10/09/2013  . Unspecified venous (peripheral) insufficiency 10/09/2013  . Statin intolerance 10/09/2013  . Prostate cancer ([place]) 12/25/2012   Outpatient Encounter Prescriptions as of 05/19/2015  Medication Sig    . acetaminophen (TYLENOL) 500 MG tablet Take 500 mg by mouth every 6 (six) hours as needed for pain.  Marland Kitchen amLODipine-valsartan (EXFORGE) 10-320 MG per tablet TAKE 1 TABLET BY MOUTH DAILY.  Marland Kitchen amLODipine-valsartan (EXFORGE) 10-320 MG tablet TAKE 1 TABLET BY MOUTH DAILY.  Marland Kitchen aspirin 81 MG tablet Take 81 mg by mouth daily.  . cholecalciferol (VITAMIN D) 1000 UNITS tablet Take 1,000 Units by mouth daily.  . fenofibrate 160 MG tablet TAKE 1 TABLET BY MOUTH DAILY  . furosemide (LASIX) 20 MG tablet 20 mg as needed.  . mupirocin ointment (BACTROBAN) 2 % Apply to AA BID x 14 days  . MYRBETRIQ 25 MG TB24 tablet Take 25 mg by mouth daily.  . ONE TOUCH ULTRA TEST test strip   . sulfamethoxazole-trimethoprim (BACTRIM DS,SEPTRA DS) 800-160 MG per tablet Take 1 tablet by mouth 2 (two) times daily.  . [DISCONTINUED] fenofibrate 160 MG tablet TAKE 1 TABLET BY MOUTH DAILY  . [DISCONTINUED] Lactulose 20 GM/30ML SOLN Take 15 mLs (10 g total) by mouth 2 (two) times daily.  . [DISCONTINUED] tamsulosin (FLOMAX) 0.4 MG CAPS capsule Take 1 capsule (0.4 mg total) by mouth daily.   No facility-administered encounter medications on file as of 05/19/2015.      Review of Systems  Constitutional: Negative.   HENT: Negative.   Eyes: Negative.   Respiratory: Negative.   Cardiovascular: Negative.   Gastrointestinal: Negative.   Endocrine: Negative.   Genitourinary: Negative.   Musculoskeletal: Negative.  Skin: Negative.        Sore on left leg   Allergic/Immunologic: Negative.   Neurological: Negative.   Hematological: Negative.   Psychiatric/Behavioral: Negative.        Objective:   Physical Exam  Constitutional: He is oriented to person, place, and time. He appears well-developed and well-nourished. No distress.  HENT:  Head: Normocephalic and atraumatic.  Right Ear: External ear normal.  Left Ear: External ear normal.  Nose: Nose normal.  Mouth/Throat: Oropharynx is clear and moist. No oropharyngeal  exudate.  Eyes: Conjunctivae and EOM are normal. Pupils are equal, round, and reactive to light. Right eye exhibits no discharge. Left eye exhibits no discharge. No scleral icterus.  Neck: Normal range of motion. Neck supple. No thyromegaly present.  Cardiovascular: Normal rate, regular rhythm and normal heart sounds.  Exam reveals no friction rub.   No murmur heard. Heart was regular at 72/m. His pulses were difficult to palpate due to pedal edema.  Pulmonary/Chest: Effort normal and breath sounds normal. No respiratory distress. He has no wheezes. He has no rales. He exhibits no tenderness.  Clear anteriorly and posteriorly  Abdominal: Soft. Bowel sounds are normal. He exhibits no mass. There is no tenderness. There is no rebound and no guarding.  The abdomen was obese without masses tenderness or bruits.  Genitourinary:  Because the patient has prostate cancer he is followed now yearly by the urologist.  Musculoskeletal: Normal range of motion. He exhibits edema. He exhibits no tenderness.  Lymphadenopathy:    He has no cervical adenopathy.  Neurological: He is alert and oriented to person, place, and time. He has normal reflexes. No cranial nerve deficit.  Skin: Skin is warm and dry. No rash noted. No erythema. No pallor.  Patient's had stasis dermatitis changes of both lower extremities with a small ulcer to the left lateral calf. Was cleansed and dressings were applied and his support hose were reapplied.  Psychiatric: He has a normal mood and affect. His behavior is normal. Judgment and thought content normal.  Nursing note and vitals reviewed.    BP 143/74 mmHg  Pulse 72  Temp(Src) 96.5 F (35.8 C) (Oral)  Ht 5\' 7"  (1.702 m)  Wt 239 lb (108.41 kg)  BMI 37.42 kg/m2 Repeat blood pressure was 130/70 right arm large cuff sitting     Assessment & Plan:  1. Essential hypertension -Repeat blood pressure today was good and no change will be made in his medication pending follow-up  with his nephrologist.  2. Chronic kidney disease, stage III (moderate) -Continue to follow up with nephrology  3. Prostate cancer ([place]) -Continue to follow-up with urology  4. Vitamin D deficiency -Continue current vitamin D dose as per nephrology  5. Hyperlipidemia -All cholesterol numbers were good except the triglycerides were slightly elevated and hopefully getting the blood sugar under better control will help this.  6. Stasis ulcer, left ([place]) -He'll be scheduled for a visit with the wound clinic where he has been in the past to help work on this ulcer before becomes bigger.  7. Type 2 diabetes mellitus with vascular disease ([place]) -After speaking with Dr. Posey Pronto today, we will start Januvia 50 mg daily.  8. 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 sitaGLIPtin (JANUVIA) 50 MG tablet    Sig: Take 1 tablet (50 mg total) by mouth daily.    Dispense:  90 tablet    Refill:  1   Patient Instructions                       Medicare Annual Wellness Visit  [facility] and the medical providers at [facility] strive to bring you the best medical care.  In doing so we not only want to address your current medical conditions and concerns but also to detect new conditions early and prevent illness, disease and health-related problems.    Medicare offers a yearly Wellness Visit which allows our clinical staff to assess your need for preventative services including immunizations, lifestyle education, counseling to decrease risk of preventable diseases and screening for fall risk and other medical concerns.    This  visit is provided free of charge (no copay) for all Medicare recipients. The clinical pharmacists at [facility] have begun to conduct these Wellness Visits which will also include a thorough review of all your medications.    As you primary medical provider recommend that you make an appointment for your Annual Wellness Visit if you have not done so already this year.  You may set up this appointment before you leave today or you may call back [phone]) and schedule an appointment.  Please make sure when you call that you mention that you are scheduling your Annual Wellness Visit with the clinical pharmacist so that the appointment may be made for the proper length of time.     Continue current medications. Continue good therapeutic lifestyle changes which include good diet and exercise. Fall precautions discussed with patient. If an FOBT was given today- please return it to our front desk. If you are over 71 years old - you may need Prevnar 79 or the adult Pneumonia vaccine.  **Flu shots will be available soon--- please call and schedule a FLU-CLINIC appointment**  After your visit with [name] today you will receive a survey in the mail or online from [employer] regarding your care with [name]. Please take a moment to fill this out. Your feedback is very important to [name] as you can help [name] better understand your patient needs as well as improve your experience and satisfaction. WE CARE ABOUT YOU!!!    I did speak with Dr. Posey Pronto today and he agreed that we should start Januvia 50 mg 1 daily to help better control of the blood sugar We will schedule follow-up visit with the clinical pharmacist to discuss future blood sugar readings and better control with diet and exercise Dr. Posey Pronto will be calling you about your ultrasound as I indicated We will schedule you with the wound clinic to follow-up on this ulcer on the left leg Use the saline soaks 20 minutes 3 or 4 times daily and  cleansing with Betadine solution and applying mupirocin ointment Watch her diet more closely We will also be checking a potassium today and we will let Dr. Posey Pronto know the results of this. Continue to follow-up with urology for prostate cancer Continue to follow-up with dermatology for skin cancer   Arrie Senate MD

## 2015-05-21 ENCOUNTER — Telehealth: Payer: Self-pay | Admitting: Family Medicine

## 2015-05-22 ENCOUNTER — Encounter (HOSPITAL_BASED_OUTPATIENT_CLINIC_OR_DEPARTMENT_OTHER): Payer: Medicare Other | Attending: Internal Medicine

## 2015-05-22 DIAGNOSIS — M179 Osteoarthritis of knee, unspecified: Secondary | ICD-10-CM | POA: Insufficient documentation

## 2015-05-22 DIAGNOSIS — Z8546 Personal history of malignant neoplasm of prostate: Secondary | ICD-10-CM | POA: Insufficient documentation

## 2015-05-22 DIAGNOSIS — I129 Hypertensive chronic kidney disease with stage 1 through stage 4 chronic kidney disease, or unspecified chronic kidney disease: Secondary | ICD-10-CM | POA: Insufficient documentation

## 2015-05-22 DIAGNOSIS — I872 Venous insufficiency (chronic) (peripheral): Secondary | ICD-10-CM | POA: Insufficient documentation

## 2015-05-22 DIAGNOSIS — N183 Chronic kidney disease, stage 3 (moderate): Secondary | ICD-10-CM | POA: Diagnosis not present

## 2015-05-22 DIAGNOSIS — E11622 Type 2 diabetes mellitus with other skin ulcer: Secondary | ICD-10-CM | POA: Insufficient documentation

## 2015-05-22 DIAGNOSIS — Z86718 Personal history of other venous thrombosis and embolism: Secondary | ICD-10-CM | POA: Diagnosis not present

## 2015-05-22 DIAGNOSIS — L97821 Non-pressure chronic ulcer of other part of left lower leg limited to breakdown of skin: Secondary | ICD-10-CM | POA: Diagnosis not present

## 2015-05-22 DIAGNOSIS — Z923 Personal history of irradiation: Secondary | ICD-10-CM | POA: Insufficient documentation

## 2015-05-22 DIAGNOSIS — Z7984 Long term (current) use of oral hypoglycemic drugs: Secondary | ICD-10-CM | POA: Insufficient documentation

## 2015-05-22 DIAGNOSIS — E1122 Type 2 diabetes mellitus with diabetic chronic kidney disease: Secondary | ICD-10-CM | POA: Diagnosis not present

## 2015-05-23 ENCOUNTER — Other Ambulatory Visit: Payer: Medicare Other

## 2015-05-23 DIAGNOSIS — K921 Melena: Secondary | ICD-10-CM | POA: Diagnosis not present

## 2015-05-26 LAB — FECAL OCCULT BLOOD, IMMUNOCHEMICAL: Fecal Occult Bld: NEGATIVE

## 2015-05-29 ENCOUNTER — Encounter: Payer: Self-pay | Admitting: Family Medicine

## 2015-05-29 ENCOUNTER — Emergency Department (HOSPITAL_COMMUNITY): Payer: Medicare Other

## 2015-05-29 ENCOUNTER — Ambulatory Visit (INDEPENDENT_AMBULATORY_CARE_PROVIDER_SITE_OTHER): Payer: Medicare Other | Admitting: Family Medicine

## 2015-05-29 ENCOUNTER — Encounter (HOSPITAL_COMMUNITY): Payer: Self-pay | Admitting: Vascular Surgery

## 2015-05-29 ENCOUNTER — Emergency Department (HOSPITAL_COMMUNITY)
Admission: EM | Admit: 2015-05-29 | Discharge: 2015-05-29 | Disposition: A | Discharge status: Home / Self Care | Payer: Medicare Other | Attending: Emergency Medicine | Admitting: Emergency Medicine

## 2015-05-29 VITALS — BP 130/84 | HR 89 | Temp 96.7°F | Ht 67.0 in | Wt 237.0 lb

## 2015-05-29 DIAGNOSIS — I129 Hypertensive chronic kidney disease with stage 1 through stage 4 chronic kidney disease, or unspecified chronic kidney disease: Secondary | ICD-10-CM | POA: Diagnosis not present

## 2015-05-29 DIAGNOSIS — Z8639 Personal history of other endocrine, nutritional and metabolic disease: Secondary | ICD-10-CM | POA: Insufficient documentation

## 2015-05-29 DIAGNOSIS — N189 Chronic kidney disease, unspecified: Secondary | ICD-10-CM | POA: Diagnosis not present

## 2015-05-29 DIAGNOSIS — Z86010 Personal history of colonic polyps: Secondary | ICD-10-CM | POA: Insufficient documentation

## 2015-05-29 DIAGNOSIS — Z7982 Long term (current) use of aspirin: Secondary | ICD-10-CM | POA: Insufficient documentation

## 2015-05-29 DIAGNOSIS — R0602 Shortness of breath: Secondary | ICD-10-CM

## 2015-05-29 DIAGNOSIS — L97322 Non-pressure chronic ulcer of left ankle with fat layer exposed: Secondary | ICD-10-CM | POA: Diagnosis not present

## 2015-05-29 DIAGNOSIS — Z85828 Personal history of other malignant neoplasm of skin: Secondary | ICD-10-CM | POA: Diagnosis not present

## 2015-05-29 DIAGNOSIS — R0682 Tachypnea, not elsewhere classified: Secondary | ICD-10-CM | POA: Diagnosis not present

## 2015-05-29 DIAGNOSIS — E1159 Type 2 diabetes mellitus with other circulatory complications: Secondary | ICD-10-CM | POA: Diagnosis not present

## 2015-05-29 DIAGNOSIS — L98492 Non-pressure chronic ulcer of skin of other sites with fat layer exposed: Secondary | ICD-10-CM

## 2015-05-29 DIAGNOSIS — N182 Chronic kidney disease, stage 2 (mild): Secondary | ICD-10-CM | POA: Diagnosis not present

## 2015-05-29 DIAGNOSIS — Z79899 Other long term (current) drug therapy: Secondary | ICD-10-CM | POA: Insufficient documentation

## 2015-05-29 DIAGNOSIS — L97822 Non-pressure chronic ulcer of other part of left lower leg with fat layer exposed: Secondary | ICD-10-CM | POA: Diagnosis not present

## 2015-05-29 DIAGNOSIS — R0600 Dyspnea, unspecified: Secondary | ICD-10-CM | POA: Insufficient documentation

## 2015-05-29 DIAGNOSIS — I2129 ST elevation (STEMI) myocardial infarction involving other sites: Secondary | ICD-10-CM

## 2015-05-29 DIAGNOSIS — Z87442 Personal history of urinary calculi: Secondary | ICD-10-CM | POA: Diagnosis not present

## 2015-05-29 DIAGNOSIS — M170 Bilateral primary osteoarthritis of knee: Secondary | ICD-10-CM | POA: Diagnosis not present

## 2015-05-29 DIAGNOSIS — Z86718 Personal history of other venous thrombosis and embolism: Secondary | ICD-10-CM | POA: Insufficient documentation

## 2015-05-29 DIAGNOSIS — I70209 Unspecified atherosclerosis of native arteries of extremities, unspecified extremity: Secondary | ICD-10-CM

## 2015-05-29 LAB — CBC WITH DIFFERENTIAL/PLATELET
BASOS ABS: 0 10*3/uL (ref 0.0–0.1)
Basophils Relative: 0 %
EOS PCT: 1 %
Eosinophils Absolute: 0.1 10*3/uL (ref 0.0–0.7)
HCT: 40.2 % (ref 39.0–52.0)
Hemoglobin: 13.2 g/dL (ref 13.0–17.0)
LYMPHS ABS: 1 10*3/uL (ref 0.7–4.0)
Lymphocytes Relative: 12 %
MCH: 29.1 pg (ref 26.0–34.0)
MCHC: 32.8 g/dL (ref 30.0–36.0)
MCV: 88.7 fL (ref 78.0–100.0)
MONO ABS: 0.6 10*3/uL (ref 0.1–1.0)
Monocytes Relative: 7 %
NEUTROS ABS: 6.6 10*3/uL (ref 1.7–7.7)
NEUTROS PCT: 80 %
PLATELETS: 124 10*3/uL — AB (ref 150–400)
RBC: 4.53 MIL/uL (ref 4.22–5.81)
RDW: 14.4 % (ref 11.5–15.5)
WBC: 8.3 10*3/uL (ref 4.0–10.5)

## 2015-05-29 LAB — COMPREHENSIVE METABOLIC PANEL
ALT: 28 U/L (ref 17–63)
ANION GAP: 7 (ref 5–15)
AST: 32 U/L (ref 15–41)
Albumin: 3.9 g/dL (ref 3.5–5.0)
Alkaline Phosphatase: 45 U/L (ref 38–126)
BUN: 42 mg/dL — ABNORMAL HIGH (ref 6–20)
CHLORIDE: 108 mmol/L (ref 101–111)
CO2: 20 mmol/L — ABNORMAL LOW (ref 22–32)
Calcium: 9 mg/dL (ref 8.9–10.3)
Creatinine, Ser: 2.28 mg/dL — ABNORMAL HIGH (ref 0.61–1.24)
GFR, EST AFRICAN AMERICAN: 31 mL/min — AB (ref >60)
GFR, EST NON AFRICAN AMERICAN: 27 mL/min — AB (ref >60)
Glucose, Bld: 142 mg/dL — ABNORMAL HIGH (ref 65–99)
Potassium: 5.2 mmol/L — ABNORMAL HIGH (ref 3.5–5.1)
SODIUM: 135 mmol/L (ref 135–145)
Total Bilirubin: 0.7 mg/dL (ref 0.3–1.2)
Total Protein: 6.4 g/dL — ABNORMAL LOW (ref 6.5–8.1)

## 2015-05-29 LAB — TROPONIN I: Troponin I: <0.03 ng/mL (ref <0.031)

## 2015-05-29 LAB — BRAIN NATRIURETIC PEPTIDE: B Natriuretic Peptide: 105.2 pg/mL — ABNORMAL HIGH (ref 0.0–100.0)

## 2015-05-29 MED ORDER — NITROGLYCERIN 0.4 MG SL SUBL: 0.4000 mg | SUBLINGUAL_TABLET | Freq: Once | SUBLINGUAL | Status: DC

## 2015-05-29 MED ORDER — FUROSEMIDE 20 MG PO TABS: 20.0000 mg | ORAL_TABLET | Freq: Every day | ORAL | Status: DC

## 2015-05-29 MED ORDER — ASPIRIN 325 MG PO TABS: 325.0000 mg | ORAL_TABLET | Freq: Once | ORAL | Status: DC

## 2015-05-29 MED ORDER — SODIUM CHLORIDE 0.9 % IV SOLN: Freq: Once | INTRAVENOUS | Status: DC

## 2015-05-29 NOTE — ED Notes (Signed)
Un bandaged wound to lateral portion of left lower leg.

## 2015-05-29 NOTE — Progress Notes (Signed)
Subjective:  Patient ID: Ricardo Mayer, male    DOB: Feb 03, 1941  Age: 74 y.o. MRN: [id number]  CC: Shortness of Breath   HPI Ricardo Mayer presents for increasing DOE. Patient has noticed significant increase in shortness of breath that has made it impossible for him to perform activities of daily living. Increase onset over the last several days. He is having trouble walking more than a few feet. He denies any chest pain. He denies any diaphoresis and nausea. He always has some swelling in his legs. It may be a little bit more than usual. However he has had DVT in the past. Risk factors for heart disease include age, hypertension, hyperlipidemia, obesity. He is a prediabetic. He is not a smoker. He has no family history of heart disease or stroke. Of note is his symptoms seemed to start shortly after starting Januvia. It seemed to make him more short of breath and weaker as well as having some sweating spells  History Ricardo Mayer has a past medical history of Hypertension; Hyperlipidemia; Adenomatous polyp of colon; Nocturia; Arthritis; History of DVT of lower extremity; Prostate cancer ([place]); History of melanoma excision; History of basal cell cancer; History of kidney stones; Prediabetes; Ulcer of left lower leg ([place]); and CKD (chronic kidney disease), stage II.   He has past surgical history that includes Prostate biopsy; Tonsillectomy; KIDNEY STONE EXTRACTIONS (LAST ONE 1970); MELANOMA EXCISION RIGHT ARM (1990); and Radioactive seed implant (N/A, 03/21/2013).   His family history includes Breast cancer in his mother; Cancer in his paternal grandfather and paternal uncle; Esophageal cancer in his brother; Lung cancer in his father.He reports that he has never smoked. He has never used smokeless tobacco. He reports that he does not drink alcohol or use illicit drugs.  Outpatient Prescriptions Prior to Visit  Medication Sig Dispense Refill  . acetaminophen (TYLENOL) 500 MG tablet Take 500 mg by  mouth every 6 (six) hours as needed for pain.    Marland Kitchen amLODipine-valsartan (EXFORGE) 10-320 MG per tablet TAKE 1 TABLET BY MOUTH DAILY. 90 tablet 3  . aspirin 81 MG tablet Take 81 mg by mouth daily.    . fenofibrate 160 MG tablet TAKE 1 TABLET BY MOUTH DAILY 90 tablet 1  . MYRBETRIQ 25 MG TB24 tablet Take 25 mg by mouth daily.  6  . ONE TOUCH ULTRA TEST test strip     . sitaGLIPtin (JANUVIA) 50 MG tablet Take 1 tablet (50 mg total) by mouth daily. 90 tablet 1  . cholecalciferol (VITAMIN D) 1000 UNITS tablet Take 1,000 Units by mouth daily.    . furosemide (LASIX) 20 MG tablet Take 20 mg by mouth daily as needed for fluid.   3  . mupirocin ointment (BACTROBAN) 2 % Apply to AA BID x 14 days 30 g 0  . sulfamethoxazole-trimethoprim (BACTRIM DS,SEPTRA DS) 800-160 MG per tablet Take 1 tablet by mouth 2 (two) times daily. 20 tablet 0  . amLODipine-valsartan (EXFORGE) 10-320 MG tablet TAKE 1 TABLET BY MOUTH DAILY. 30 tablet 5   No facility-administered medications prior to visit.    ROS Review of Systems  Constitutional: Positive for activity change and fatigue. Negative for fever, diaphoresis and unexpected weight change.  HENT: Negative for congestion, postnasal drip, rhinorrhea and sore throat.   Eyes: Negative for discharge, itching and visual disturbance.  Respiratory: Positive for shortness of breath. Negative for cough, chest tightness, wheezing and stridor.   Cardiovascular: Positive for leg swelling. Negative for chest pain and palpitations.  Gastrointestinal: Positive for abdominal distention. Negative for nausea, vomiting, abdominal pain, diarrhea and constipation.  Genitourinary: Negative for dysuria and hematuria.  Musculoskeletal: Positive for joint swelling and arthralgias. Negative for myalgias.  Skin: Negative.   Allergic/Immunologic: Negative.   Neurological: Positive for weakness and light-headedness. Negative for dizziness, syncope, speech difficulty and numbness.    Psychiatric/Behavioral: Negative for sleep disturbance, dysphoric mood and agitation. The patient is not nervous/anxious.     Objective:  BP 130/84 mmHg  Pulse 89  Temp(Src) 96.7 F (35.9 C) (Oral)  Ht 5\' 7"  (1.702 m)  Wt 237 lb (107.502 kg)  BMI 37.11 kg/m2  SpO2 95%  BP Readings from Last 3 Encounters:  05/29/15 135/67  05/29/15 130/84  05/19/15 143/74    Wt Readings from Last 3 Encounters:  05/29/15 237 lb (107.502 kg)  05/19/15 239 lb (108.41 kg)  05/14/15 239 lb (108.41 kg)     Physical Exam  Lab Results  Component Value Date   HGBA1C 7.0 05/14/2015   HGBA1C 6.2 01/30/2015   HGBA1C 6.1% 03/05/2013    Lab Results  Component Value Date   WBC 8.3 05/29/2015   HGB 13.2 05/29/2015   HCT 40.2 05/29/2015   PLT 124* 05/29/2015   GLUCOSE 142* 05/29/2015   CHOL 140 05/14/2015   TRIG 168* 05/14/2015   HDL 38* 05/14/2015   LDLDIRECT 21 03/05/2013   LDLCALC 68 05/14/2015   ALT 28 05/29/2015   AST 32 05/29/2015   NA 135 05/29/2015   K 5.2* 05/29/2015   CL 108 05/29/2015   CREATININE 2.28* 05/29/2015   BUN 42* 05/29/2015   CO2 20* 05/29/2015   TSH 2.930 08/28/2014   PSA 0.9 03/17/2014   INR 1.0 03/15/2013   HGBA1C 7.0 05/14/2015    Dg Chest 2 View  05/29/2015  CLINICAL DATA:  Shortness of breath for 2 days.  Diabetes. EXAM: CHEST - 2 VIEW COMPARISON:  None. FINDINGS: The heart size is normal. The lungs are clear. Mild chronic interstitial coarsening is evident. Degenerative changes are noted in the thoracolumbar spine. The visualized soft tissues and bony thorax are otherwise unremarkable. IMPRESSION: No active disease. Electronically Signed   By: San Morelle M.D.   On: 05/29/2015 12:05    Assessment & Plan:   Ricardo Mayer was seen today for shortness of breath.  Diagnoses and all orders for this visit:  SOB (shortness of breath) on exertion -     EKG 12-Lead -     Cancel: DG Chest 2 View -     aspirin tablet 325 mg; Take 1 tablet (325 mg total) by  mouth once. -     nitroGLYCERIN (NITROSTAT) SL tablet 0.4 mg; Place 1 tablet (0.4 mg total) under the tongue once. -     0.9 %  sodium chloride infusion; Inject into the vein once.  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 We will continue to administer aspirin, nitroGLYCERIN, and sodium chloride.  Meds ordered this encounter  Medications  . aspirin tablet 325 mg    Sig:   . nitroGLYCERIN (NITROSTAT) SL tablet 0.4 mg    Sig:   . 0.9 %  sodium chloride infusion    Sig:    Differential dx includes Pulmonary embolism, COPD exacrebation. Pt. tranfserred by EMS  [facility] with IV, O2 4L [place] having had NTg 0.4 mg sl and ASA 324 mg chewed and swallowed. Lucid, stable at time of transfer. Follow-up: Return for  reevaluatioin after hospital release.  Claretta Fraise, M.D.

## 2015-05-29 NOTE — ED Notes (Signed)
Patient C/O increasing dyspnea.  Began [place] 8 days ago for an A1C of 8.  Patient with a history of renal insuffiencey

## 2015-05-29 NOTE — ED Provider Notes (Signed)
CSN: [id number]     Arrival date & time 05/29/15  1059 History   First MD Initiated Contact with Patient 05/29/15 1059     Chief Complaint  Patient presents with  . Shortness of Breath     (Consider location/radiation/quality/duration/timing/severity/associated sxs/prior Treatment) HPI Patient presents via EMS from outpatient clinic with concerns of dyspnea Patient states that about 5 days ago he began have dyspnea, particularly with exertion. Symptoms have progressed since onset. No new chest pain, belly pain, nausea, vomiting. Patient recently started a new medication for diabetes, Januvia. Other than this, no new medication changes, activity changes, diet changes. Per report, staff at the clinic felt the patient had new arrhythmia, prompting transfer. EMS course the patient was hemodynamically stable en route, with no decompensation. Patient received nitroglycerin at the outlying facility, though he had no chest pain. Per report, the patient subsequently had episode of hypotension, which improved with IV fluids.  Past Medical History  Diagnosis Date  . Hypertension   . Hyperlipidemia   . Adenomatous polyp of colon   . Nocturia   . Arthritis     bilateral knees  . History of DVT of lower extremity     2011--  LEFT LEG  . Prostate cancer ([place])   . History of melanoma excision     RIGHT ARM  . History of basal cell cancer   . History of kidney stones   . Prediabetes     DIET CONTROLLED  . Ulcer of left lower leg ([place])     recent pcp visit dr Laurance Flatten--  ordered betadine wet/dry dsg daily (03-18-2013)  [place] 1 WEEK  . CKD (chronic kidney disease), stage II    Past Surgical History  Procedure Laterality Date  . Prostate biopsy    . Tonsillectomy    . Kidney stone extractions  LAST ONE 1970    X2 OPEN/  X1  URETEROSCOPY  . Melanoma excision right arm  1990  . Radioactive seed implant N/A 03/21/2013    Procedure: RADIOACTIVE SEED IMPLANT;   Surgeon: Malka So, MD;  Location[facility]  Service: Urology;  Laterality: N/A;  79 seeds implanted     Family History  Problem Relation Age of Onset  . Breast cancer Mother   . Lung cancer Father   . Esophageal cancer Brother   . Cancer Paternal Uncle     prostate  . Cancer Paternal Grandfather     prostate   Social History  Substance Use Topics  . Smoking status: Never Smoker   . Smokeless tobacco: Never Used  . Alcohol Use: No    Review of Systems  Constitutional:       Per HPI, otherwise negative  HENT:       Per HPI, otherwise negative  Respiratory:       Per HPI, otherwise negative  Cardiovascular:       Per HPI, otherwise negative  Gastrointestinal: Negative for vomiting.  Endocrine:       Negative aside from HPI  Genitourinary:       Neg aside from HPI   Musculoskeletal:       Per HPI, otherwise negative  Skin: Negative.   Neurological: Negative for syncope.      Allergies  Review of patient's allergies indicates no known allergies.  Home Medications   Prior to Admission medications   Medication Sig Start Date End Date Taking? Authorizing Provider  acetaminophen (TYLENOL) 500 MG tablet Take  500 mg by mouth every 6 (six) hours as needed for pain.   Yes Historical Provider, MD  amLODipine-valsartan (EXFORGE) 10-320 MG per tablet TAKE 1 TABLET BY MOUTH DAILY. 04/17/14  Yes [name], MD  aspirin 81 MG tablet Take 81 mg by mouth daily.   Yes Historical Provider, MD  fenofibrate 160 MG tablet TAKE 1 TABLET BY MOUTH DAILY 05/17/15  Yes Tiffany A Gann, PA-C  MYRBETRIQ 25 MG TB24 tablet Take 25 mg by mouth daily. 03/03/15  Yes Historical Provider, MD  sitaGLIPtin (JANUVIA) 50 MG tablet Take 1 tablet (50 mg total) by mouth daily. 05/19/15  Yes [name], MD  furosemide (LASIX) 20 MG tablet Take 1 tablet (20 mg total) by mouth daily. 05/29/15   Carmin Muskrat, MD  ONE TOUCH ULTRA TEST test strip  04/05/13   Historical Provider, MD    BP 134/68 mmHg  Pulse 80  Temp(Src) 97.7 F (36.5 C) (Oral)  Resp 22  SpO2 97% Physical Exam  Constitutional: He is oriented to person, place, and time. He appears well-developed. No distress.  HENT:  Head: Normocephalic and atraumatic.  Eyes: Conjunctivae and EOM are normal.  Cardiovascular: Normal rate and regular rhythm.   Pulmonary/Chest: Effort normal. No stridor. No respiratory distress. He has no wheezes. He has no rales.  Abdominal: He exhibits no distension.  Musculoskeletal: He exhibits no edema.  Neurological: He is alert and oriented to person, place, and time.  Skin: Skin is warm and dry.  Psychiatric: He has a normal mood and affect.  Nursing note and vitals reviewed.   ED Course  Procedures (including critical care time) Labs Review Labs Reviewed  COMPREHENSIVE METABOLIC PANEL - Abnormal; Notable for the following:    Potassium 5.2 (*)    CO2 20 (*)    Glucose, Bld 142 (*)    BUN 42 (*)    Creatinine, Ser 2.28 (*)    Total Protein 6.4 (*)    GFR calc non Af Amer 27 (*)    GFR calc Af Amer 31 (*)    All other components within normal limits  CBC WITH DIFFERENTIAL/PLATELET - Abnormal; Notable for the following:    Platelets 124 (*)    All other components within normal limits  BRAIN NATRIURETIC PEPTIDE - Abnormal; Notable for the following:    B Natriuretic Peptide 105.2 (*)    All other components within normal limits  TROPONIN I    Imaging Review Dg Chest 2 View  05/29/2015  CLINICAL DATA:  Shortness of breath for 2 days.  Diabetes. EXAM: CHEST - 2 VIEW COMPARISON:  None. FINDINGS: The heart size is normal. The lungs are clear. Mild chronic interstitial coarsening is evident. Degenerative changes are noted in the thoracolumbar spine. The visualized soft tissues and bony thorax are otherwise unremarkable. IMPRESSION: No active disease. Electronically Signed   By: San Morelle M.D.   On: 05/29/2015 12:05   I have personally reviewed and  evaluated these images and lab results as part of my medical decision-making.   EKG Interpretation   Date/Time:  Friday May 29 2015 11:19:00 EDT Ventricular Rate:  77 PR Interval:  218 QRS Duration: 118 QT Interval:  375 QTC Calculation: 424 R Axis:   -70 Text Interpretation:  Sinus rhythm Ventricular premature complex  Borderline prolonged PR interval Left anterior fascicular block Anterior  infarct, old Borderline T abnormalities, inferior leads Baseline wander in  lead(s) V1 Sinus rhythm Non-specific intra-ventricular conduction delay T  wave abnormality  Artifact No significant change since last tracing  Abnormal ekg Confirmed by Carmin Muskrat  MD [phone]) on 05/29/2015  1:12:22 PM     I reviewed the EMS rhythm strip, EKG from outlying facility, consistent with sinus rhythm, intraventricular conduction delay, rate 65 - 80 on different strips.  On repeat exam the patient is calm, no ongoing complaints. We unwrapped his left leg nonhealing ulcer, and there is a less than 1 cm area of exposed tissue, but no evidence for surrounding infection.  Had a lengthy conversation with patient, multiple family members about all findings. After suspicious case with our cardiology colleagues, and they will call him to set up an appointment in the coming days for echocardiogram, repeat evaluation.   Chart review demonstrates the patient is on Lasix to be taken as needed, but this will be decreased to daily dosing pending outpatient echocardiogram    MDM   Final diagnoses:  Dyspnea  CKD (chronic kidney disease), unspecified stage  Non-healing ulcer, with fat layer exposed [facility])   elderly male presents as a transfer from outpatient clinic with concern for dyspnea, but no pain, either there or here. Patient has no evidence for distress, decompensated heart failure, or other acute new pathology. However, the patient does have evidence for worsening chronic kidney disease, some evidence for  fluid overloaded state. Patient had switched from his as needed Lasix dosing to consistent dosing. Patient had case discussed with our cardiology colleagues, who will help facilitate outpatient follow-up.    Carmin Muskrat, MD 05/29/15 1415

## 2015-05-29 NOTE — Discharge Instructions (Signed)
As discussed, with today's findings, it is important that you follow-up with our cardiology colleagues for further evaluation, management, consideration of echocardiogram.  Return here for concerning changes in your condition.

## 2015-06-02 ENCOUNTER — Encounter: Payer: Self-pay | Admitting: Interventional Cardiology

## 2015-06-02 ENCOUNTER — Ambulatory Visit: Payer: Medicare Other | Admitting: Pharmacist Clinician (PhC)/ Clinical Pharmacy Specialist

## 2015-06-02 ENCOUNTER — Ambulatory Visit (INDEPENDENT_AMBULATORY_CARE_PROVIDER_SITE_OTHER): Payer: Medicare Other | Admitting: Interventional Cardiology

## 2015-06-02 VITALS — BP 110/60 | HR 82 | Ht 67.0 in | Wt 233.0 lb

## 2015-06-02 DIAGNOSIS — N183 Chronic kidney disease, stage 3 unspecified: Secondary | ICD-10-CM

## 2015-06-02 DIAGNOSIS — E139 Other specified diabetes mellitus without complications: Secondary | ICD-10-CM | POA: Diagnosis not present

## 2015-06-02 DIAGNOSIS — E1159 Type 2 diabetes mellitus with other circulatory complications: Secondary | ICD-10-CM

## 2015-06-02 DIAGNOSIS — E119 Type 2 diabetes mellitus without complications: Secondary | ICD-10-CM

## 2015-06-02 DIAGNOSIS — R0609 Other forms of dyspnea: Secondary | ICD-10-CM | POA: Insufficient documentation

## 2015-06-02 DIAGNOSIS — I70209 Unspecified atherosclerosis of native arteries of extremities, unspecified extremity: Secondary | ICD-10-CM | POA: Diagnosis not present

## 2015-06-02 DIAGNOSIS — R0602 Shortness of breath: Secondary | ICD-10-CM

## 2015-06-02 DIAGNOSIS — R0789 Other chest pain: Secondary | ICD-10-CM

## 2015-06-02 NOTE — Patient Instructions (Signed)
**Note De-Identified Inari Shin Obfuscation** Medication Instructions:  Same-no changes  Labwork: None  Testing/Procedures: Your physician has requested that you have an echocardiogram. Echocardiography is a painless test that uses sound waves to create images of your heart. It provides your doctor with information about the size and shape of your heart and how well your heart's chambers and valves are working. This procedure takes approximately one hour. There are no restrictions for this procedure.  Your physician has requested that you have a lexiscan myoview. For further information please visit [email or web address]. Please follow instruction sheet, as given.   Follow-Up: To be determined

## 2015-06-02 NOTE — Progress Notes (Signed)
Patient ID: Ricardo Mayer, male   DOB: 01/12/1941, 74 y.o.   MRN: [id number]     Cardiology Office Note   Date:  06/02/2015   ID:  Ricardo Mayer, Ricardo Mayer 1940-12-26, MRN [id number]  PCP:  Ricardo Gainer, MD    No chief complaint on file.  SHOB  Wt Readings from Last 3 Encounters:  06/02/15 233 lb (105.688 kg)  05/29/15 237 lb (107.502 kg)  05/19/15 239 lb (108.41 kg)       History of Present Illness: Ricardo Mayer is a 74 y.o. male  Who has had a h/o DM and HTN.  He has been having increasing SHOB.  He would recover quickly until last Friday.  He had severe SHOB while walking up stairs.  He did not recover quickly.  He went to the ER and had a negative w/u there.  He was sent home.  He was told to take Lasix daily instead of prn. His weight is down and the Scnetx is improved.    He presents today for further workup. Overall, he is feeling better than he did a week ago. His weight is down. He does feel that he urinates frequently with the furosemide. He has not had any exertional chest discomfort. One short-lived episode of chest discomfort which was worse during deep breathing. Of note, he does follow-up with Dr. Posey Pronto and nephrology.    Past Medical History  Diagnosis Date  . Hypertension   . Hyperlipidemia   . Adenomatous polyp of colon   . Nocturia   . Arthritis     bilateral knees  . History of DVT of lower extremity     2011--  LEFT LEG  . Prostate cancer ([place])   . History of melanoma excision     RIGHT ARM  . History of basal cell cancer   . History of kidney stones   . Prediabetes     DIET CONTROLLED  . Ulcer of left lower leg ([place])     recent pcp visit dr Laurance Flatten--  ordered betadine wet/dry dsg daily (03-18-2013)  [place] 1 WEEK  . CKD (chronic kidney disease), stage II     Past Surgical History  Procedure Laterality Date  . Prostate biopsy    . Tonsillectomy    . Kidney stone extractions  LAST ONE 1970    X2 OPEN/  X1  URETEROSCOPY    . Melanoma excision right arm  1990  . Radioactive seed implant N/A 03/21/2013    Procedure: RADIOACTIVE SEED IMPLANT;  Surgeon: Malka So, MD;  [facility];  Service: Urology;  Laterality: N/A;  87 seeds implanted       Current Outpatient Prescriptions  Medication Sig Dispense Refill  . acetaminophen (TYLENOL) 500 MG tablet Take 500 mg by mouth every 6 (six) hours as needed for pain.    Marland Kitchen amLODipine-valsartan (EXFORGE) 10-320 MG per tablet TAKE 1 TABLET BY MOUTH DAILY. 90 tablet 3  . aspirin 81 MG tablet Take 81 mg by mouth daily.    . fenofibrate 160 MG tablet TAKE 1 TABLET BY MOUTH DAILY 90 tablet 1  . furosemide (LASIX) 20 MG tablet Take 1 tablet (20 mg total) by mouth daily. 30 tablet 0  . MYRBETRIQ 25 MG TB24 tablet Take 25 mg by mouth daily.  6  . ONE TOUCH ULTRA TEST test strip     . sitaGLIPtin (JANUVIA) 50 MG tablet Take 1 tablet (50 mg total)  by mouth daily. 90 tablet 1   Current Facility-Administered Medications  Medication Dose Route Frequency Provider Last Rate Last Dose  . 0.9 %  sodium chloride infusion   Intravenous Once Claretta Fraise, MD      . aspirin tablet 325 mg  325 mg Oral Once Claretta Fraise, MD      . nitroGLYCERIN (NITROSTAT) SL tablet 0.4 mg  0.4 mg Sublingual Once Claretta Fraise, MD        Allergies:   Review of patient's allergies indicates no known allergies.    Social History:  The patient  reports that he has never smoked. He has never used smokeless tobacco. He reports that he does not drink alcohol or use illicit drugs.   Family History:  The patient's family history includes Breast cancer in his mother; Cancer in his paternal grandfather and paternal uncle; Esophageal cancer in his brother; Hypertension in his mother; Lung cancer in his father; Stroke in his mother. There is no history of Heart attack.    ROS:  Please see the history of present illness.   Otherwise, review of systems are positive for [facility].  Leg swelling.   All other systems are reviewed and negative.    PHYSICAL EXAM: VS:  BP 110/60 mmHg  Pulse 82  Ht 5\' 7"  (1.702 m)  Wt 233 lb (105.688 kg)  BMI 36.48 kg/m2  SpO2 96% , BMI Body mass index is 36.48 kg/(m^2). GEN: Well nourished, well developed, in no acute distress HEENT: normal Neck: no JVD, carotid bruits, or masses Cardiac: RRR; no murmurs, rubs, or gallops,no edema  Respiratory:  clear to auscultation bilaterally, normal work of breathing GI: soft, nontender, nondistended, + BS MS: no deformity or atrophy Skin: warm and dry, no rash Neuro:  Strength and sensation are intact Psych: euthymic mood, full affect   EKG:   The ekg ordered on 10/14 demonstrates  normal sinus rhythm, poor R-wave progression   Recent Labs: 08/28/2014: TSH 2.930 04/15/2015: Magnesium 2.0 05/29/2015: ALT 28; B Natriuretic Peptide 105.2*; BUN 42*; Creatinine, Ser 2.28*; Hemoglobin 13.2; Platelets 124*; Potassium 5.2*; Sodium 135   Lipid Panel    Component Value Date/Time   CHOL 140 05/14/2015 0901   TRIG 168* 05/14/2015 0901   HDL 38* 05/14/2015 0901   CHOLHDL 3.7 05/14/2015 0901   LDLCALC 68 05/14/2015 0901   LDLDIRECT 21 03/05/2013 1100     Other studies Reviewed: Additional studies/ records that were reviewed today with results demonstrating: BNP minimally elevated. Negative troponin in the ER.    ASSESSMENT AND PLAN:  1. DOE:  Need to evaluate left ventricular function. Check echocardiogram. Given his diabetes, this could also be an anginal equivalent. We'll plan for [employer]. He would be unable to walk any prolonged distance on the treadmill. 2. CRI:  creatinine has been increasing over the last few months. I'm hesitant to do a cardiac cath on this patient unless he has high risk anatomy based on stress test. 3. Diabetes mellitus: Managed by PMD.  HbA1C had increased to 8.     Current medicines are reviewed at length with the patient today.  The patient concerns regarding his  medicines were addressed.  The following changes have been made:  No change  Labs/ tests ordered today include:  No orders of the defined types were placed in this encounter.    Recommend 150 minutes/week of aerobic exercise Low fat, low carb, high fiber diet recommended  Disposition:   FU in post test  Teresita Madura., MD  06/02/2015 9:43 AM    [facility] [place], , [place]  [address] Phone: [phone]; Fax: [phone]

## 2015-06-10 ENCOUNTER — Telehealth (HOSPITAL_COMMUNITY): Payer: Self-pay | Admitting: *Deleted

## 2015-06-10 NOTE — Telephone Encounter (Signed)
Patient given detailed instructions per Myocardial Perfusion Study Information Sheet for the test on 06/16/15 at 1000. Patient notified to arrive 15 minutes early and that it is imperative to arrive on time for appointment to keep from having the test rescheduled.  If you need to cancel or reschedule your appointment, please call the office within 24 hours of your appointment. Failure to do so may result in a cancellation of your appointment, and a $50 no show fee. Patient verbalized understanding.Hubbard Robinson, RN

## 2015-06-11 ENCOUNTER — Ambulatory Visit: Payer: Medicare Other | Admitting: Family Medicine

## 2015-06-11 DIAGNOSIS — Z7984 Long term (current) use of oral hypoglycemic drugs: Secondary | ICD-10-CM | POA: Diagnosis not present

## 2015-06-11 DIAGNOSIS — I872 Venous insufficiency (chronic) (peripheral): Secondary | ICD-10-CM | POA: Diagnosis not present

## 2015-06-11 DIAGNOSIS — E11622 Type 2 diabetes mellitus with other skin ulcer: Secondary | ICD-10-CM | POA: Diagnosis not present

## 2015-06-11 DIAGNOSIS — Z86718 Personal history of other venous thrombosis and embolism: Secondary | ICD-10-CM | POA: Diagnosis not present

## 2015-06-11 DIAGNOSIS — L97821 Non-pressure chronic ulcer of other part of left lower leg limited to breakdown of skin: Secondary | ICD-10-CM | POA: Diagnosis not present

## 2015-06-11 DIAGNOSIS — Z923 Personal history of irradiation: Secondary | ICD-10-CM | POA: Diagnosis not present

## 2015-06-12 ENCOUNTER — Ambulatory Visit: Payer: Medicare Other | Admitting: Family Medicine

## 2015-06-15 ENCOUNTER — Ambulatory Visit (INDEPENDENT_AMBULATORY_CARE_PROVIDER_SITE_OTHER): Payer: Medicare Other | Admitting: Pharmacist

## 2015-06-15 ENCOUNTER — Encounter: Payer: Self-pay | Admitting: Pharmacist

## 2015-06-15 VITALS — BP 132/75 | HR 74 | Ht 67.0 in | Wt 238.0 lb

## 2015-06-15 DIAGNOSIS — E669 Obesity, unspecified: Secondary | ICD-10-CM | POA: Diagnosis not present

## 2015-06-15 DIAGNOSIS — E119 Type 2 diabetes mellitus without complications: Secondary | ICD-10-CM

## 2015-06-15 DIAGNOSIS — E1159 Type 2 diabetes mellitus with other circulatory complications: Secondary | ICD-10-CM

## 2015-06-15 DIAGNOSIS — I70209 Unspecified atherosclerosis of native arteries of extremities, unspecified extremity: Secondary | ICD-10-CM

## 2015-06-15 NOTE — Patient Instructions (Signed)
Ok to decrease checking blood glucose to once or twice.   Diabetes and Standards of Medical Care   Diabetes is complicated. You may find that your diabetes team includes a dietitian, nurse, diabetes educator, eye doctor, and more. To help everyone know what is going on and to help you get the care you deserve, the following schedule of care was developed to help keep you on track. Below are the tests, exams, vaccines, medicines, education, and plans you will need.  Blood Glucose Goals Prior to meals = 80 - 130 Within 2 hours of the start of a meal = less than 180  HbA1c test (goal is less than 7.0% - your last value was 7.0%) This test shows how well you have controlled your glucose over the past 2 to 3 months. It is used to see if your diabetes management plan needs to be adjusted.   It is performed at least 2 times a year if you are meeting treatment goals.  It is performed 4 times a year if therapy has changed or if you are not meeting treatment goals.  Blood pressure test  This test is performed at every routine medical visit. The goal is less than 140/90 mmHg for most people, but 130/80 mmHg in some cases. Ask your health care provider about your goal.  Dental exam  Follow up with the dentist regularly.  Eye exam  If you are diagnosed with type 1 diabetes as a child, get an exam upon reaching the age of 22 years or older and have had diabetes for 3 to 5 years. Yearly eye exams are recommended after that initial eye exam.  If you are diagnosed with type 1 diabetes as an adult, get an exam within 5 years of diagnosis and then yearly.  If you are diagnosed with type 2 diabetes, get an exam as soon as possible after the diagnosis and then yearly.  Foot care exam  Visual foot exams are performed at every routine medical visit. The exams check for cuts, injuries, or other problems with the feet.  A comprehensive foot exam should be done yearly. This includes visual inspection as  well as assessing foot pulses and testing for loss of sensation.  Check your feet nightly for cuts, injuries, or other problems with your feet. Tell your health care provider if anything is not healing.  Kidney function test (urine microalbumin)  This test is performed once a year.  Type 1 diabetes: The first test is performed 5 years after diagnosis.  Type 2 diabetes: The first test is performed at the time of diagnosis.  A serum creatinine and estimated glomerular filtration rate (eGFR) test is done once a year to assess the level of chronic kidney disease (CKD), if present.  Lipid profile (cholesterol, HDL, LDL, triglycerides)  Performed every 5 years for most people.  The goal for LDL is less than 100 mg/dL. If you are at high risk, the goal is less than 70 mg/dL.  The goal for HDL is 40 mg/dL to 50 mg/dL for men and 50 mg/dL to 60 mg/dL for women. An HDL cholesterol of 60 mg/dL or higher gives some protection against heart disease.  The goal for triglycerides is less than 150 mg/dL.  Influenza vaccine, pneumococcal vaccine, and hepatitis B vaccine  The influenza vaccine is recommended yearly.  The pneumococcal vaccine is generally given once in a lifetime. However, there are some instances when another vaccination is recommended. Check with your health care provider.  The hepatitis B vaccine is also recommended for adults with diabetes.  Diabetes self-management education  Education is recommended at diagnosis and ongoing as needed.  Treatment plan  Your treatment plan is reviewed at every medical visit.  Document Released: 05/29/2009 Document Revised: 04/03/2013 Document Reviewed: 01/01/2013 [facility] Patient Information 2014 [place].

## 2015-06-15 NOTE — Progress Notes (Signed)
Subjective:    Ricardo Mayer is a 74 y.o. male who presents for an initial evaluation of Type 2 diabetes mellitus.  He previsously was being followed for pre diabetes but his last A1c was 7.0%.  He was started on Januvia 50mg  1 tablet daily in early October 2016 however he developed SOB and was taken to hospital to r/o MI.  He was instructed to stop Januvia and SOB has resolved.   The patient was initially diagnosed with Type 2 diabetes mellitus 04/2015  Known diabetic complications: none and however patient dose already have CKD prior to diabetes diagnosis Cardiovascular risk factors: advanced age (older than 68 for men, 71 for women), diabetes mellitus, dyslipidemia, hypertension, male gender, microalbuminuria, obesity (BMI >= 30 kg/m2) and sedentary lifestyle Current diabetic medications include none.   Eye exam current (within one year): yes - however eyes exam was prior to diabetes diagnosis Weight trend: stable Prior visit with dietician: no Current diet: in general, an "unhealthy" diet Current exercise: none  Current monitoring regimen: patient is checking BG 7 times daily Home blood sugar records: Avg AM BG = 119; 2 hours after breakfast avg = 140 Prior to lunch avg = 125;  2 hours after lunch avg = 170 Prior to supper avg = 123; 2 hours after supper avg = 159 Bedtime avg = 135  Any episodes of hypoglycemia? no  Is He on ACE inhibitor or angiotensin II receptor blocker?  Yes  valsartan (Diovan)  The following portions of the patient's history were reviewed and updated as appropriate: allergies, current medications, past family history, past medical history, past social history, past surgical history and problem list.   Objective:    BP 132/75 mmHg  Pulse 74  Ht 5\' 7"  (1.702 m)  Wt 238 lb (107.956 kg)  BMI 37.27 kg/m2  A1c = 7.0%  Lab Review GLUCOSE (mg/dL)  Date Value  05/14/2015 132*  04/15/2015 145*  01/28/2015 152*   GLUCOSE, BLD (mg/dL)  Date Value   05/29/2015 142*  03/05/2013 109*   CO2 (mmol/L)  Date Value  05/29/2015 20*  05/14/2015 20  04/15/2015 22   BUN (mg/dL)  Date Value  05/29/2015 42*  05/14/2015 31*  04/15/2015 38*  01/28/2015 33*  03/05/2013 29*   CREAT (mg/dL)  Date Value  03/05/2013 1.92*   CREATININE, SER (mg/dL)  Date Value  05/29/2015 2.28*  05/14/2015 2.20*  04/15/2015 2.02*    Assessment:    Diabetes Mellitus type II, under fair control Obesity.    Plan:    1.  Rx changes: none - I did discuss and give information about GLP1 agonist which would be a good choice for patient if we start medication 2.  Education: Reviewed 'ABCs' of diabetes management (respective goals in parentheses):  A1C (<7), blood pressure (<130/80), and cholesterol (LDL <100). 3.  Reviewed CHO counting diet - portion size recommendation, etc.  Patient is advised to try to get 55 grams or less per meal and 20 gram or less per meal.  Dietary changes recommended should also help with weight loss 4.  Encouraged increased activity once cleared by cardio - has appt tomorrow for ECHO 5.  Follow up: 2 months    Cherre Robins, PharmD, CPP, CDE

## 2015-06-16 ENCOUNTER — Ambulatory Visit (HOSPITAL_COMMUNITY): Payer: Medicare Other | Attending: Cardiovascular Disease

## 2015-06-16 ENCOUNTER — Other Ambulatory Visit: Payer: Self-pay

## 2015-06-16 ENCOUNTER — Ambulatory Visit (HOSPITAL_BASED_OUTPATIENT_CLINIC_OR_DEPARTMENT_OTHER): Payer: Medicare Other

## 2015-06-16 DIAGNOSIS — E785 Hyperlipidemia, unspecified: Secondary | ICD-10-CM | POA: Insufficient documentation

## 2015-06-16 DIAGNOSIS — I517 Cardiomegaly: Secondary | ICD-10-CM | POA: Insufficient documentation

## 2015-06-16 DIAGNOSIS — I351 Nonrheumatic aortic (valve) insufficiency: Secondary | ICD-10-CM | POA: Diagnosis not present

## 2015-06-16 DIAGNOSIS — R0789 Other chest pain: Secondary | ICD-10-CM | POA: Diagnosis not present

## 2015-06-16 DIAGNOSIS — R9439 Abnormal result of other cardiovascular function study: Secondary | ICD-10-CM | POA: Insufficient documentation

## 2015-06-16 DIAGNOSIS — I358 Other nonrheumatic aortic valve disorders: Secondary | ICD-10-CM | POA: Diagnosis not present

## 2015-06-16 DIAGNOSIS — R0602 Shortness of breath: Secondary | ICD-10-CM | POA: Insufficient documentation

## 2015-06-16 DIAGNOSIS — R0600 Dyspnea, unspecified: Secondary | ICD-10-CM | POA: Diagnosis not present

## 2015-06-16 DIAGNOSIS — N189 Chronic kidney disease, unspecified: Secondary | ICD-10-CM | POA: Insufficient documentation

## 2015-06-16 DIAGNOSIS — E119 Type 2 diabetes mellitus without complications: Secondary | ICD-10-CM | POA: Diagnosis not present

## 2015-06-16 DIAGNOSIS — I129 Hypertensive chronic kidney disease with stage 1 through stage 4 chronic kidney disease, or unspecified chronic kidney disease: Secondary | ICD-10-CM | POA: Diagnosis not present

## 2015-06-16 DIAGNOSIS — E139 Other specified diabetes mellitus without complications: Secondary | ICD-10-CM | POA: Diagnosis not present

## 2015-06-16 DIAGNOSIS — R55 Syncope and collapse: Secondary | ICD-10-CM | POA: Insufficient documentation

## 2015-06-16 MED ORDER — TECHNETIUM TC 99M SESTAMIBI GENERIC - CARDIOLITE
31.7000 | Freq: Once | INTRAVENOUS | Status: AC | PRN
  Administered 2015-06-16: 31.7 via INTRAVENOUS

## 2015-06-16 MED ORDER — TECHNETIUM TC 99M SESTAMIBI GENERIC - CARDIOLITE
10.8000 | Freq: Once | INTRAVENOUS | Status: AC | PRN
  Administered 2015-06-16: 11 via INTRAVENOUS

## 2015-06-16 MED ORDER — REGADENOSON 0.4 MG/5ML IV SOLN
0.4000 mg | Freq: Once | INTRAVENOUS | Status: AC
  Administered 2015-06-16: 0.4 mg via INTRAVENOUS

## 2015-06-18 ENCOUNTER — Encounter (HOSPITAL_BASED_OUTPATIENT_CLINIC_OR_DEPARTMENT_OTHER): Payer: Medicare Other | Attending: Internal Medicine

## 2015-06-18 ENCOUNTER — Telehealth: Payer: Self-pay | Admitting: Interventional Cardiology

## 2015-06-18 DIAGNOSIS — L97821 Non-pressure chronic ulcer of other part of left lower leg limited to breakdown of skin: Secondary | ICD-10-CM | POA: Diagnosis not present

## 2015-06-18 DIAGNOSIS — I87332 Chronic venous hypertension (idiopathic) with ulcer and inflammation of left lower extremity: Secondary | ICD-10-CM | POA: Insufficient documentation

## 2015-06-18 DIAGNOSIS — Z86718 Personal history of other venous thrombosis and embolism: Secondary | ICD-10-CM | POA: Insufficient documentation

## 2015-06-18 DIAGNOSIS — M1990 Unspecified osteoarthritis, unspecified site: Secondary | ICD-10-CM | POA: Diagnosis not present

## 2015-06-18 DIAGNOSIS — I872 Venous insufficiency (chronic) (peripheral): Secondary | ICD-10-CM | POA: Diagnosis not present

## 2015-06-18 DIAGNOSIS — E1151 Type 2 diabetes mellitus with diabetic peripheral angiopathy without gangrene: Secondary | ICD-10-CM | POA: Insufficient documentation

## 2015-06-18 LAB — MYOCARDIAL PERFUSION IMAGING
CHL CUP RESTING HR STRESS: 72 {beats}/min
CSEPPHR: 83 {beats}/min
LV dias vol: 113 mL
LVSYSVOL: 59 mL
NUC STRESS TID: 1.06
RATE: 0.39
SDS: 1
SRS: 1
SSS: 2

## 2015-06-18 NOTE — Telephone Encounter (Signed)
Patient informed. 

## 2015-06-18 NOTE — Telephone Encounter (Signed)
New message ° ° ° ° °Returning a call to the nurse °

## 2015-06-20 ENCOUNTER — Other Ambulatory Visit: Payer: Self-pay | Admitting: Family Medicine

## 2015-06-24 ENCOUNTER — Other Ambulatory Visit: Payer: Self-pay | Admitting: *Deleted

## 2015-06-24 MED ORDER — GLUCOSE BLOOD VI STRP: ORAL_STRIP | Status: DC

## 2015-06-25 DIAGNOSIS — M1990 Unspecified osteoarthritis, unspecified site: Secondary | ICD-10-CM | POA: Diagnosis not present

## 2015-06-25 DIAGNOSIS — I87332 Chronic venous hypertension (idiopathic) with ulcer and inflammation of left lower extremity: Secondary | ICD-10-CM | POA: Diagnosis not present

## 2015-06-25 DIAGNOSIS — L97821 Non-pressure chronic ulcer of other part of left lower leg limited to breakdown of skin: Secondary | ICD-10-CM | POA: Diagnosis not present

## 2015-06-25 DIAGNOSIS — I872 Venous insufficiency (chronic) (peripheral): Secondary | ICD-10-CM | POA: Diagnosis not present

## 2015-06-25 DIAGNOSIS — E1151 Type 2 diabetes mellitus with diabetic peripheral angiopathy without gangrene: Secondary | ICD-10-CM | POA: Diagnosis not present

## 2015-06-25 DIAGNOSIS — Z86718 Personal history of other venous thrombosis and embolism: Secondary | ICD-10-CM | POA: Diagnosis not present

## 2015-06-29 ENCOUNTER — Ambulatory Visit (INDEPENDENT_AMBULATORY_CARE_PROVIDER_SITE_OTHER): Payer: Medicare Other | Admitting: Family Medicine

## 2015-06-29 ENCOUNTER — Telehealth: Payer: Self-pay | Admitting: Interventional Cardiology

## 2015-06-29 ENCOUNTER — Encounter: Payer: Self-pay | Admitting: Family Medicine

## 2015-06-29 VITALS — BP 129/79 | HR 88 | Temp 96.7°F | Ht 67.0 in | Wt 232.0 lb

## 2015-06-29 DIAGNOSIS — R0602 Shortness of breath: Secondary | ICD-10-CM

## 2015-06-29 DIAGNOSIS — E1159 Type 2 diabetes mellitus with other circulatory complications: Secondary | ICD-10-CM

## 2015-06-29 DIAGNOSIS — R55 Syncope and collapse: Secondary | ICD-10-CM | POA: Diagnosis not present

## 2015-06-29 DIAGNOSIS — R0609 Other forms of dyspnea: Secondary | ICD-10-CM

## 2015-06-29 DIAGNOSIS — R42 Dizziness and giddiness: Secondary | ICD-10-CM | POA: Diagnosis not present

## 2015-06-29 DIAGNOSIS — C61 Malignant neoplasm of prostate: Secondary | ICD-10-CM

## 2015-06-29 DIAGNOSIS — N183 Chronic kidney disease, stage 3 unspecified: Secondary | ICD-10-CM

## 2015-06-29 NOTE — Telephone Encounter (Signed)
Please call this patient in 2-3 days and see if he is having any more dyspnea.  I spoke to Dr. Laurance Flatten, his PMD, today.  They are placing a 30 day monitor.  If sx get worse, we will schedule a cath despite his chronic renal insufficiency.  Jettie Booze, MD

## 2015-06-29 NOTE — Patient Instructions (Signed)
Continue to follow up with nephrology We will discuss your symptoms with the cardiologist and confirm with him if there are any other tests that he would like for [name] to do to further evaluate this shortness of breath with exertion problem that you're having

## 2015-06-29 NOTE — Progress Notes (Signed)
Subjective:    Patient ID: Ricardo Mayer, male    DOB: [birth year], 74 y.o.   MRN: [id number]  HPI Patient here today for difficulty breathing and light headedness that started last night. The patient has recently been sent to the emergency room because of shortness of breath. He had an echocardiogram and a myocardial perfusion study. The patient also sees the nephrologist because of his elevated creatinine. His last creatinine was 2.02. This patient is usually not a complainer. He indicates that he left church last night and walking from the church to his car that he got extremely short of breath and had no chest discomfort. He got home walk from the car to the home and got short of breath again and indicated that he passed out momentarily and the wife was ready to call EMS and he got better and said they did not call EMS. With all of this he has not had any fever and only a slight cough and mostly due to shortness of breath and no chest pain. The patient indicates that this morning he feels lightheaded.     Patient Active Problem List   Diagnosis Date Noted  . DOE (dyspnea on exertion) 06/02/2015  . Controlled type 2 diabetes mellitus ([place]) 06/02/2015  . Melanoma, history of 10/09/2013  . Hypertension 10/09/2013  . Chronic kidney disease, stage III (moderate) 10/09/2013  . Unspecified venous (peripheral) insufficiency 10/09/2013  . Statin intolerance 10/09/2013  . Prostate cancer ([place]) 12/25/2012   Outpatient Encounter Prescriptions as of 06/29/2015  Medication Sig  . acetaminophen (TYLENOL) 500 MG tablet Take 500 mg by mouth every 6 (six) hours as needed for pain.  Marland Kitchen amLODipine-valsartan (EXFORGE) 10-320 MG per tablet TAKE 1 TABLET BY MOUTH DAILY.  Marland Kitchen aspirin 81 MG tablet Take 81 mg by mouth daily.  . fenofibrate 160 MG tablet TAKE 1 TABLET BY MOUTH DAILY  . furosemide (LASIX) 20 MG tablet Take 1 tablet (20 mg total) by mouth daily.  Marland Kitchen glucose blood (ONE TOUCH ULTRA TEST) test strip  USE FOR TESTING SUGAR 2 TIMES DAILY E11.9  . MYRBETRIQ 25 MG TB24 tablet Take 25 mg by mouth daily.  . ONE TOUCH ULTRA TEST test strip    No facility-administered encounter medications on file as of 06/29/2015.     Review of Systems  Constitutional: Positive for fatigue.  HENT: Negative.   Eyes: Negative.   Respiratory: Positive for shortness of breath.   Cardiovascular: Negative.   Gastrointestinal: Negative.   Endocrine: Negative.   Genitourinary: Negative.   Musculoskeletal: Negative.   Skin: Negative.   Allergic/Immunologic: Negative.   Neurological: Positive for light-headedness.  Hematological: Negative.   Psychiatric/Behavioral: Negative.        Objective:   Physical Exam  Constitutional: He is oriented to person, place, and time. He appears well-developed and well-nourished. He appears distressed.  HENT:  Head: Normocephalic and atraumatic.  Right Ear: External ear normal.  Left Ear: External ear normal.  Nose: Nose normal.  Mouth/Throat: Oropharynx is clear and moist. No oropharyngeal exudate.  Eyes: Conjunctivae and EOM are normal. Pupils are equal, round, and reactive to light. Right eye exhibits no discharge. Left eye exhibits no discharge. No scleral icterus.  Neck: Normal range of motion. Neck supple. No thyromegaly present.  Cardiovascular: Normal rate, regular rhythm and normal heart sounds.   No murmur heard. At 84/m  Pulmonary/Chest: Effort normal and breath sounds normal. No respiratory distress. He has no wheezes. He has no rales. He exhibits  no tenderness.  Clear anteriorly and posteriorly  Musculoskeletal: Normal range of motion. He exhibits no edema.  Lymphadenopathy:    He has no cervical adenopathy.  Neurological: He is alert and oriented to person, place, and time.  Skin: Skin is warm and dry. No rash noted. No erythema. No pallor.  Psychiatric: He has a normal mood and affect. His behavior is normal. Judgment and thought content normal.    Somewhat anxious about what is happening when he is so short of breath and not knowing what is going on.  Nursing note and vitals reviewed.  BP 129/79 mmHg  Pulse 88  Temp(Src) 96.7 F (35.9 C) (Oral)  Ht _0  (1.702 m)  Wt 232 lb (105.235 kg)  BMI 36.33 kg/m2  SpO2 96%  EKG: The patient has a left anterior fascicular block and anterolateral ST elevation that appears to be a repolarization the area. We will have the cardiologist to review this and make sure he agrees with this in light of the patient's symptoms.  I did speak to the cardiologist. He agrees that a [employer] monitor or events monitor would be appropriate. He will call the patient's couple of days in light of his symptoms of dyspnea on exertion that occurred since last night and again this morning. During the episode last night the patient came in from his car and had a syncopal episode in the house and the wife was going to call EMS and then the patient said he felt better and they did not call EMS. He did notice the dyspnea on exertion and again this morning. With all of these episodes he had no chest pain. All this was discussed with the cardiologist and he agreed that further evaluation may be necessary. He will call the patient in a couple days, we would put on a [employer] [facility] monitor, and he would plan to see the patient in a couple weeks. All this was also discussed with his wife he came into the exam room so that she was fully aware of what our plans are with her husband. It was felt that he should not return to work until we resolve the cause for these symptoms. Greater than 1 hour of time was spent discussing the plans with the patient his wife and also having a conversation with the cardiologist and planning his follow-up.  The [facility] of Hearts monitor was applied to the patient's chest before he left the office.    Assessment & Plan:  1. SOB (shortness of breath) -We will apply the [name] monitor to make  sure the patient is not having an arrhythmia is causing his episodes shortness of breath with exertion - BMP8+EGFR - CBC with Differential/Platelet - EKG 12-Lead - Cardiac event monitor  2. Dyspnea on exertion -Discussion with cardiologist and he agreed to the event monitor - EKG 12-Lead - Cardiac event monitor  3. Syncope, unspecified syncope type -Uncertain as to the cause of this. We will be checking a CBC and repeating his BMP today.  4. Type 2 diabetes mellitus with vascular disease ([place]) -The patient indicates that when he had this episode last night and his blood sugar was checked and was in the 200 range.  5. Chronic kidney disease, stage III (moderate) -He will continue with follow-up with Dr. Posey Pronto  6. Prostate cancer [facility]) -Continue with follow-up with the urologist  Patient Instructions  Continue to follow up with nephrology We will discuss your symptoms with the cardiologist  and confirm with him if there are any other tests that he would like for [name] to do to further evaluate this shortness of breath with exertion problem that you're having   Arrie Senate MD

## 2015-06-30 LAB — CBC WITH DIFFERENTIAL/PLATELET
BASOS: 0 %
Basophils Absolute: 0 10*3/uL (ref 0.0–0.2)
EOS (ABSOLUTE): 0.1 10*3/uL (ref 0.0–0.4)
EOS: 1 %
HEMATOCRIT: 40.9 % (ref 37.5–51.0)
HEMOGLOBIN: 14.1 g/dL (ref 12.6–17.7)
IMMATURE GRANS (ABS): 0 10*3/uL (ref 0.0–0.1)
IMMATURE GRANULOCYTES: 0 %
LYMPHS: 11 %
Lymphocytes Absolute: 1.1 10*3/uL (ref 0.7–3.1)
MCH: 29.3 pg (ref 26.6–33.0)
MCHC: 34.5 g/dL (ref 31.5–35.7)
MCV: 85 fL (ref 79–97)
MONOCYTES: 6 %
Monocytes Absolute: 0.6 10*3/uL (ref 0.1–0.9)
NEUTROS ABS: 8.4 10*3/uL — AB (ref 1.4–7.0)
Neutrophils: 82 %
Platelets: 164 10*3/uL (ref 150–379)
RBC: 4.82 x10E6/uL (ref 4.14–5.80)
RDW: 15.1 % (ref 12.3–15.4)
WBC: 10.3 10*3/uL (ref 3.4–10.8)

## 2015-06-30 LAB — BMP8+EGFR
BUN/Creatinine Ratio: 17 (ref 10–22)
BUN: 33 mg/dL — ABNORMAL HIGH (ref 8–27)
CALCIUM: 9.6 mg/dL (ref 8.6–10.2)
CO2: 19 mmol/L (ref 18–29)
CREATININE: 1.99 mg/dL — AB (ref 0.76–1.27)
Chloride: 101 mmol/L (ref 97–106)
GFR calc non Af Amer: 32 mL/min/{1.73_m2} — ABNORMAL LOW (ref >59)
GFR, EST AFRICAN AMERICAN: 37 mL/min/{1.73_m2} — AB (ref >59)
Glucose: 141 mg/dL — ABNORMAL HIGH (ref 65–99)
POTASSIUM: 4.7 mmol/L (ref 3.5–5.2)
Sodium: 138 mmol/L (ref 136–144)

## 2015-07-01 NOTE — Telephone Encounter (Signed)
We can wait until the monitor is done.  Call us if dyspnea returns

## 2015-07-01 NOTE — Telephone Encounter (Signed)
**Note De-Identified Ricardo Mayer Obfuscation** The pt states that he has had no more episodes of dyspnea since 11/14 when he was seen by his PCP. He states that he is wearing Event monitor at this time. He is advised to call the office if his dyspnea returns. He verbalized understanding.  He states that his PCP told his wife that he would need a cardiology follow up. They want to know if we should schedule an appt now or wait until his monitor results are in. Please advise.

## 2015-07-02 NOTE — Telephone Encounter (Signed)
Advised pt that Dr. Irish Lack said ok to wait until the monitor is done and call us if dyspnea returns. Pt verbalized understanding and was in agreement with this plan.

## 2015-07-14 ENCOUNTER — Telehealth: Payer: Self-pay | Admitting: Interventional Cardiology

## 2015-07-14 NOTE — Telephone Encounter (Signed)
New Message   Pt wife is calling she wants to know what is the next step after his Echo on 06/16/15  She dont know if she should make him another office visit appt

## 2015-07-14 NOTE — Telephone Encounter (Signed)
I spoke to Dr. Laurance Flatten.  The patient was having more SHOB.  If this persists or gets worse, we would reconsider cardiac cath.  Patient was wearing a heart monitor as well.

## 2015-07-14 NOTE — Telephone Encounter (Signed)
**Note De-Identified Benino Korinek Obfuscation** The pts wife is advised and she verbalized understanding.  An appt with Dr Irish Lack has been scheduled for 07/21/15 per the pts wife request, Dr Irish Lack is aware.

## 2015-07-14 NOTE — Telephone Encounter (Signed)
The pt was advised at his last OV with Dr Irish Lack on 10/18 that his f/u would be determined depending on his Lexiscan results.  He had his Lexiscan on 11/1 and Dr Hassell Done recommendations were as follows: No significant ischemia. Mildly decreased LV function. No need for cath at this time which is good given his abnormal renal function.  Please advise on f/u.

## 2015-07-21 ENCOUNTER — Ambulatory Visit (INDEPENDENT_AMBULATORY_CARE_PROVIDER_SITE_OTHER): Payer: Medicare Other | Admitting: Interventional Cardiology

## 2015-07-21 ENCOUNTER — Encounter: Payer: Self-pay | Admitting: Interventional Cardiology

## 2015-07-21 VITALS — BP 110/60 | HR 86 | Ht 67.0 in | Wt 229.8 lb

## 2015-07-21 DIAGNOSIS — E1122 Type 2 diabetes mellitus with diabetic chronic kidney disease: Secondary | ICD-10-CM

## 2015-07-21 DIAGNOSIS — R42 Dizziness and giddiness: Secondary | ICD-10-CM | POA: Diagnosis not present

## 2015-07-21 DIAGNOSIS — I70209 Unspecified atherosclerosis of native arteries of extremities, unspecified extremity: Secondary | ICD-10-CM | POA: Diagnosis not present

## 2015-07-21 DIAGNOSIS — E1159 Type 2 diabetes mellitus with other circulatory complications: Secondary | ICD-10-CM | POA: Diagnosis not present

## 2015-07-21 DIAGNOSIS — N183 Chronic kidney disease, stage 3 unspecified: Secondary | ICD-10-CM

## 2015-07-21 DIAGNOSIS — R0609 Other forms of dyspnea: Secondary | ICD-10-CM

## 2015-07-21 DIAGNOSIS — I10 Essential (primary) hypertension: Secondary | ICD-10-CM | POA: Diagnosis not present

## 2015-07-21 NOTE — Progress Notes (Signed)
Patient ID: Ricardo Mayer, male   DOB: 11-16-1940, 74 y.o.   MRN: [id number]     Cardiology Office Note   Date:  07/21/2015   ID:  Ricardo Mayer, Ricardo Mayer November 05, 1940, MRN [id number]  PCP:  Ricardo Gainer, MD    No chief complaint on file.  SHOB  Wt Readings from Last 3 Encounters:  07/21/15 229 lb 12.8 oz (104.237 kg)  06/29/15 232 lb (105.235 kg)  06/16/15 233 lb (105.688 kg)       History of Present Illness: Ricardo Mayer is a 74 y.o. male  Who has had a h/o DM and HTN.  He has been having increasing SHOB.  This got better in the last month.  Over the past few days, sx returned.   He had severe SHOB while walking up stairs.  He did not recover quickly.  He went to the ER and had a negative w/u there.  He was sent home.  He was told to take Lasix daily instead of prn. His weight is down and the [facility] is improved.    He presents today for further workup. Overall, he is feeling better than he did a few days ago. His weight is down. He does feel that he urinates frequently with the furosemide. He has not had any exertional chest discomfort. One short-lived episode of chest discomfort which was worse during deep breathing. Of note, he does follow-up with Ricardo Mayer and nephrology.  He has been wearing an event monitor.  No abnormalites noted at this time.  There is some hesitation to cath due to increased Cr. And atypical sx.     Past Medical History  Diagnosis Date  . Hypertension   . Hyperlipidemia   . Adenomatous polyp of colon   . Nocturia   . Arthritis     bilateral knees  . History of DVT of lower extremity     2011--  LEFT LEG  . Prostate cancer ([place])   . History of melanoma excision     RIGHT ARM  . History of basal cell cancer   . History of kidney stones   . Prediabetes     DIET CONTROLLED  . Ulcer of left lower leg ([place])     recent pcp visit dr Laurance Mayer--  ordered betadine wet/dry dsg daily (03-18-2013)  [place] 1 WEEK  . CKD (chronic kidney  disease), stage II     Past Surgical History  Procedure Laterality Date  . Prostate biopsy    . Tonsillectomy    . Kidney stone extractions  LAST ONE 1970    X2 OPEN/  X1  URETEROSCOPY  . Melanoma excision right arm  1990  . Radioactive seed implant N/A 03/21/2013    Procedure: RADIOACTIVE SEED IMPLANT;  Surgeon: Malka So, MD;  Location[facility];  Service: Urology;  Laterality: N/A;  87 seeds implanted       Current Outpatient Prescriptions  Medication Sig Dispense Refill  . acetaminophen (TYLENOL) 500 MG tablet Take 500 mg by mouth every 6 (six) hours as needed for pain.    Marland Kitchen amLODipine-valsartan (EXFORGE) 10-320 MG per tablet TAKE 1 TABLET BY MOUTH DAILY. 90 tablet 3  . aspirin 81 MG tablet Take 81 mg by mouth daily.    . fenofibrate 160 MG tablet TAKE 1 TABLET BY MOUTH DAILY 90 tablet 1  . furosemide (LASIX) 20 MG tablet Take 1 tablet (20 mg total) by mouth daily. [place]  tablet 0  . glucose blood (ONE TOUCH ULTRA TEST) test strip USE FOR TESTING SUGAR 2 TIMES DAILY E11.9 100 each 2  . MYRBETRIQ 25 MG TB24 tablet Take 25 mg by mouth daily.  6  . ONE TOUCH ULTRA TEST test strip     . sitaGLIPtin (JANUVIA) 50 MG tablet Take 50 mg by mouth daily.     No current facility-administered medications for this visit.    Allergies:   Review of patient's allergies indicates no known allergies.    Social History:  The patient  reports that he has never smoked. He has never used smokeless tobacco. He reports that he does not drink alcohol or use illicit drugs.   Family History:  The patient's family history includes Breast cancer in his mother; Cancer in his paternal grandfather and paternal uncle; Esophageal cancer in his brother; Hypertension in his mother; Lung cancer in his father; Stroke in his mother. There is no history of Heart attack.    ROS:  Please see the history of present illness.   Otherwise, review of systems are positive for [facility].  Leg swelling.  All other  systems are reviewed and negative.    PHYSICAL EXAM: VS:  BP 110/60 mmHg  Pulse 86  Ht 5\' 7"  (1.702 m)  Wt 229 lb 12.8 oz (104.237 kg)  BMI 35.98 kg/m2  SpO2 96% , BMI Body mass index is 35.98 kg/(m^2). GEN: Well nourished, well developed, in no acute distress HEENT: normal Neck: no JVD, carotid bruits, or masses Cardiac: RRR; no murmurs, rubs, or gallops, trivial LE bilateral edema  Respiratory:  clear to auscultation bilaterally, normal work of breathing GI: soft, nontender, nondistended, + BS, obese MS: no deformity or atrophy Skin: warm and dry, no rash Neuro:  Strength and sensation are intact Psych: euthymic mood, full affect   EKG:   The ekg ordered on 10/14 demonstrates  normal sinus rhythm, poor R-wave progression   Recent Labs: 08/28/2014: TSH 2.930 04/15/2015: Magnesium 2.0 05/29/2015: ALT 28; B Natriuretic Peptide 105.2*; Hemoglobin 13.2; Platelets 124* 06/29/2015: BUN 33*; Creatinine, Ser 1.99*; Potassium 4.7; Sodium 138   Lipid Panel    Component Value Date/Time   CHOL 140 05/14/2015 0901   TRIG 168* 05/14/2015 0901   HDL 38* 05/14/2015 0901   CHOLHDL 3.7 05/14/2015 0901   LDLCALC 68 05/14/2015 0901   LDLDIRECT 21 03/05/2013 1100     Other studies Reviewed: Additional studies/ records that were reviewed today with results demonstrating: BNP minimally elevated. Negative troponin in the ER.    ASSESSMENT AND PLAN:  1. DOE:  Normal echocardiogram. Given his diabetes, this could also be an anginal equivalent.Normal  Lexiscan Myoview. Sx resolved for 30 days, until a few days ago.  Not typical pattern for severe CAD.  Will discuss plan with Ricardo Mayer after the monitor is finished.  Cath could be a next step but risk is increased due to CRI.  THis is why I have hesitated to pursue cath.  We would have to hydrate well and minimize dye usage.  2. CRI:  creatinine has been increasing over the last few months. I'm hesitant to do a cardiac cath on this patient for  this reason.  More recent Cr was < 2.   3. Diabetes mellitus: Managed by PMD.  HbA1C had increased to 8.   4. Dizziness seems unlikely to be from CAD.  Concerned more about arrhythmia.  Await final read on his monitor.     Current medicines are  reviewed at length with the patient today.  The patient concerns regarding his medicines were addressed.  The following changes have been made:  No change  Labs/ tests ordered today include:  No orders of the defined types were placed in this encounter.    Recommend 150 minutes/week of aerobic exercise Low fat, low carb, high fiber diet recommended  Disposition:   FU in post monitor     Teresita Madura., MD  07/21/2015 4:01 PM    [facility] [address], [place], [place]  [address] Phone: [phone]; Fax: [phone]

## 2015-07-21 NOTE — Patient Instructions (Signed)
**Note De-Identified Ricardo Mayer Obfuscation** Medication Instructions:  Same-no changes  Labwork: None  Testing/Procedures: None  Follow-Up: To be determined     If you need a refill on your cardiac medications before your next appointment, please call your pharmacy.

## 2015-07-22 ENCOUNTER — Other Ambulatory Visit (INDEPENDENT_AMBULATORY_CARE_PROVIDER_SITE_OTHER): Payer: Medicare Other

## 2015-07-22 ENCOUNTER — Encounter: Payer: Self-pay | Admitting: Family Medicine

## 2015-07-22 DIAGNOSIS — Z8546 Personal history of malignant neoplasm of prostate: Secondary | ICD-10-CM

## 2015-07-22 NOTE — Progress Notes (Signed)
Lab only 

## 2015-07-23 LAB — PSA, TOTAL AND FREE
PSA FREE PCT: 18 %
PSA, Free: 0.09 ng/mL
Prostate Specific Ag, Serum: 0.5 ng/mL (ref 0.0–4.0)

## 2015-07-27 ENCOUNTER — Telehealth: Payer: Self-pay | Admitting: Family Medicine

## 2015-07-27 NOTE — Telephone Encounter (Signed)
Per question ,  Yes,  must mail heart monitor back to company. Wife is very concerned about husband and wants Roselyn Reef and Dr. Laurance Flatten to please watch for the results and only wants Roselyn Reef to call to discuss since she and Doctor Laurance Flatten are monitoring closely.

## 2015-07-31 ENCOUNTER — Ambulatory Visit (INDEPENDENT_AMBULATORY_CARE_PROVIDER_SITE_OTHER): Payer: Medicare Other | Admitting: Urology

## 2015-07-31 DIAGNOSIS — N3941 Urge incontinence: Secondary | ICD-10-CM | POA: Diagnosis not present

## 2015-07-31 DIAGNOSIS — Z8546 Personal history of malignant neoplasm of prostate: Secondary | ICD-10-CM

## 2015-08-03 ENCOUNTER — Encounter: Payer: Self-pay | Admitting: Family Medicine

## 2015-08-05 ENCOUNTER — Telehealth: Payer: Self-pay | Admitting: Neurology

## 2015-08-05 ENCOUNTER — Encounter: Payer: Self-pay | Admitting: Family Medicine

## 2015-08-05 ENCOUNTER — Ambulatory Visit (INDEPENDENT_AMBULATORY_CARE_PROVIDER_SITE_OTHER): Payer: Medicare Other | Admitting: Family Medicine

## 2015-08-05 VITALS — BP 138/74 | HR 70 | Temp 96.8°F | Ht 67.0 in | Wt 235.0 lb

## 2015-08-05 DIAGNOSIS — I70209 Unspecified atherosclerosis of native arteries of extremities, unspecified extremity: Secondary | ICD-10-CM | POA: Diagnosis not present

## 2015-08-05 DIAGNOSIS — R55 Syncope and collapse: Secondary | ICD-10-CM

## 2015-08-05 DIAGNOSIS — E119 Type 2 diabetes mellitus without complications: Secondary | ICD-10-CM | POA: Diagnosis not present

## 2015-08-05 DIAGNOSIS — R42 Dizziness and giddiness: Secondary | ICD-10-CM | POA: Diagnosis not present

## 2015-08-05 DIAGNOSIS — N183 Chronic kidney disease, stage 3 unspecified: Secondary | ICD-10-CM

## 2015-08-05 DIAGNOSIS — C61 Malignant neoplasm of prostate: Secondary | ICD-10-CM

## 2015-08-05 DIAGNOSIS — E1159 Type 2 diabetes mellitus with other circulatory complications: Secondary | ICD-10-CM | POA: Diagnosis not present

## 2015-08-05 NOTE — Addendum Note (Signed)
Addended by: Zannie Cove on: 08/05/2015 02:33 PM   Modules accepted: Orders

## 2015-08-05 NOTE — Telephone Encounter (Signed)
Dr. Tawanna Sat office with [facility] has called and are sending a referral for this pt to be seen by Dr. Jannifer Franklin. Dr. Laurance Flatten is requesting a call back to discuss the pt. Dr. Laurance Flatten will not be in the office after today.  Dr. Laurance Flatten will be at the office till 5p today [phone] office, his cell [phone]

## 2015-08-05 NOTE — Patient Instructions (Signed)
The patient should continue his current medications  He should continue follow-up with the nephrologist Ann the urologist as planned  He should drink plenty of fluids and stay well hydrated  He should monitor his blood sugars closely  We will arrange for him after talking with the neurologist to see the neurologist and to further evaluate these severe episodes of syncope in the continuing episodes of dizziness.  The neurologist would decide what other tests would be needed to further evaluate him.

## 2015-08-05 NOTE — Telephone Encounter (Signed)
I talk with Dr. Laurance Flatten. The patient has had at least 2 episodes of syncope, multiple episodes of dizziness, has seen cardiology who has not found evidence of a cardiac source of syncope on their evaluation, this included a 30 day heart monitor. The patient will be sent over here for evaluation of possible neurologic etiology of syncope.

## 2015-08-05 NOTE — Progress Notes (Signed)
Subjective:    Patient ID: Ricardo Mayer, male    DOB: [birth year], 74 y.o.   MRN: [id number]  HPI Patient here today to review recent heart monitor results and find out what the next step is in finding out what is causing his episodes. These episodes include weakness, dizziness, and   Syncope. The patient has been wearing an event monitor for about 4 weeks and this report was reviewed with him today during the visit. His only had a couple of episodes of dizziness with the event monitor and there was no syncope during that time. When he had the episodes of dizziness there were no arrhythmias noted at that time on his event monitor. The patient has since seen the cardiologist and feels like his syncope episodes are not cardiac in origin. This patient is not a complainer and these were real events that happened to him. He does check his blood sugars at home. He does see the nephrologist periodically and the urologist because of prostate cancer. He does have a history of melanoma. He denies any chest pain shortness of breath trouble swallowing heartburn indigestion and nausea vomiting diarrhea or blood in the stool.       Patient Active Problem List   Diagnosis Date Noted  . DOE (dyspnea on exertion) 06/02/2015  . Controlled type 2 diabetes mellitus ([place]) 06/02/2015  . Melanoma, history of 10/09/2013  . Hypertension 10/09/2013  . Chronic kidney disease, stage III (moderate) 10/09/2013  . Unspecified venous (peripheral) insufficiency 10/09/2013  . Statin intolerance 10/09/2013  . Prostate cancer ([place]) 12/25/2012   Outpatient Encounter Prescriptions as of 08/05/2015  Medication Sig  . acetaminophen (TYLENOL) 500 MG tablet Take 500 mg by mouth every 6 (six) hours as needed for pain.  Marland Kitchen amLODipine-valsartan (EXFORGE) 10-320 MG per tablet TAKE 1 TABLET BY MOUTH DAILY.  Marland Kitchen aspirin 81 MG tablet Take 81 mg by mouth daily.  . fenofibrate 160 MG tablet TAKE 1 TABLET BY MOUTH DAILY  . furosemide  (LASIX) 20 MG tablet Take 1 tablet (20 mg total) by mouth daily.  Marland Kitchen glucose blood (ONE TOUCH ULTRA TEST) test strip USE FOR TESTING SUGAR 2 TIMES DAILY E11.9  . MYRBETRIQ 25 MG TB24 tablet Take 25 mg by mouth daily.  . ONE TOUCH ULTRA TEST test strip   . sitaGLIPtin (JANUVIA) 50 MG tablet Take 50 mg by mouth daily.   No facility-administered encounter medications on file as of 08/05/2015.     Review of Systems  Constitutional: Negative.   HENT: Negative.   Eyes: Negative.   Respiratory: Negative.   Cardiovascular: Negative.   Gastrointestinal: Negative.   Endocrine: Negative.   Genitourinary: Negative.   Musculoskeletal: Negative.   Skin: Negative.   Allergic/Immunologic: Negative.   Neurological: Negative.   Hematological: Negative.   Psychiatric/Behavioral: Negative.        Objective:   Physical Exam  Constitutional: He is oriented to person, place, and time. He appears well-developed and well-nourished.  HENT:  Head: Normocephalic and atraumatic.  Right Ear: External ear normal.  Left Ear: External ear normal.  Nose: Nose normal.  Mouth/Throat: Oropharynx is clear and moist. No oropharyngeal exudate.  Eyes: Conjunctivae and EOM are normal. Pupils are equal, round, and reactive to light. Right eye exhibits no discharge. Left eye exhibits no discharge. No scleral icterus.  Neck: Normal range of motion. Neck supple. No thyromegaly present.  Cardiovascular: Normal rate, regular rhythm and normal heart sounds.   No murmur heard. Pulmonary/Chest: Effort normal  and breath sounds normal. No respiratory distress. He has no wheezes. He has no rales. He exhibits no tenderness.  Abdominal: Soft. Bowel sounds are normal. He exhibits no mass. There is no tenderness. There is no rebound and no guarding.  Genitourinary: Rectum normal.  Musculoskeletal: Normal range of motion. He exhibits no edema.  Lymphadenopathy:    He has no cervical adenopathy.  Neurological: He is alert and  oriented to person, place, and time.  Skin: Skin is warm and dry. No rash noted.  Psychiatric: He has a normal mood and affect. His behavior is normal. Judgment and thought content normal.  Nursing note and vitals reviewed.    BP 138/74 mmHg  Pulse 70  Temp(Src) 96.8 F (36 C) (Oral)  Ht 5\' 7"  (1.702 m)  Wt 235 lb (106.595 kg)  BMI 36.80 kg/m2      Assessment & Plan:  1. Dizziness and giddiness -Not sure of the cause -If any episodes of this please stop which are doing and call for help  2. Syncope, unspecified syncope type -Neurology referral  3. Chronic kidney disease, stage III (moderate) -Continue to follow-up with nephrologist  4. Prostate cancer ([place]) -Continue to follow-up with urologist  5. Controlled type 2 diabetes mellitus without complication, without long-term current use of insulin ([place]) -Continue monitoring blood sugars closely  Patient Instructions   The patient should continue his current medications  He should continue follow-up with the nephrologist Ann the urologist as planned  He should drink plenty of fluids and stay well hydrated  He should monitor his blood sugars closely  We will arrange for him after talking with the neurologist to see the neurologist and to further evaluate these severe episodes of syncope in the continuing episodes of dizziness.  The neurologist would decide what other tests would be needed to further evaluate him.   Arrie Senate MD

## 2015-08-06 ENCOUNTER — Encounter: Payer: Self-pay | Admitting: Family Medicine

## 2015-08-24 ENCOUNTER — Ambulatory Visit: Payer: Self-pay | Admitting: Pharmacist

## 2015-08-25 ENCOUNTER — Ambulatory Visit (INDEPENDENT_AMBULATORY_CARE_PROVIDER_SITE_OTHER): Payer: Medicare Other | Admitting: Neurology

## 2015-08-25 ENCOUNTER — Encounter: Payer: Self-pay | Admitting: Neurology

## 2015-08-25 VITALS — BP 136/67 | HR 81 | Ht 67.0 in | Wt 236.5 lb

## 2015-08-25 DIAGNOSIS — R55 Syncope and collapse: Secondary | ICD-10-CM | POA: Insufficient documentation

## 2015-08-25 HISTORY — DX: Syncope and collapse: R55

## 2015-08-25 NOTE — Patient Instructions (Signed)

## 2015-08-25 NOTE — Progress Notes (Signed)
Reason for visit: Dizziness  Referring physician: Dr. Dellis Anes Ricardo Mayer is a 75 y.o. male  History of present illness:  Ricardo Mayer is a 75 year old right-handed white male with a history of onset of dizziness that began around 05/29/2015. The patient indicates that the episode came on suddenly, he was walking from a car to a doctor's office when he suddenly became lightheaded and dizzy, but he also developed sudden onset of shortness of breath. He denied any chest pain or cough. The patient has had episodes of dizziness with standing only, with significant dyspnea on exertion even with minimal activity. The patient has had a single syncopal event that occurred on 06/28/2015. The patient was walking from his car into his home, and he began feeling dizzy, short of breath, went to sit down, but blacked out briefly. The patient did not have any jerking, he did not lose control of the bowels or the bladder, he did not bite his tongue. The patient has had ongoing shortness of breath that has not worsened. He denies any focal numbness or weakness of the face, arms, or legs. He has been seen through cardiology, a 2-D echocardiogram was done showing an ejection fraction of 55-60%, the patient had a chemical stress test that was apparently unremarkable, he was seen and evaluated through cardiology. He was not felt to have a cardiac source of syncope or dizziness. A prolonged cardiac monitor study did not show cardiac rhythm abnormalities to explain the issues. He has noted onset of increased swelling of the right leg associated with redness and warmth that began about 3 weeks ago. He denies any pain in the leg. He reports that both legs started swelling around the time of onset of symptoms of dizziness in October 2016. When dizzy, his gait may be unsteady, otherwise he has no unsteadiness of gait. His hemoglobin A1c was 6.2. He does report some fecal urgency, but no urinary urgency. He has a history of  chronic renal insufficiency. The patient is sent to this office for an evaluation. He did note the onset of dizziness around the time he went on Januvia, but he stopped the medication for about a month without alleviation of symptoms. He has gone back on the medication since.  Past Medical History  Diagnosis Date  . Hypertension   . Hyperlipidemia   . Adenomatous polyp of colon   . Nocturia   . Arthritis     bilateral knees  . History of DVT of lower extremity     2011--  LEFT LEG  . Prostate cancer ([place])   . History of melanoma excision     RIGHT ARM  . History of basal cell cancer   . History of kidney stones   . Prediabetes     DIET CONTROLLED  . Ulcer of left lower leg ([place])     recent pcp visit dr Laurance Flatten--  ordered betadine wet/dry dsg daily (03-18-2013)  [place] 1 WEEK  . CKD (chronic kidney disease), stage II   . Diabetes mellitus without complication ([place])   . Near syncope 08/25/2015    Past Surgical History  Procedure Laterality Date  . Prostate biopsy    . Tonsillectomy    . Kidney stone extractions  LAST ONE 1970    X2 OPEN/  X1  URETEROSCOPY  . Melanoma excision right arm  1990  . Radioactive seed implant N/A 03/21/2013    Procedure: RADIOACTIVE SEED IMPLANT;  Surgeon: Jenny Reichmann  Keene Breath, MD;  [facility];  Service: Urology;  Laterality: N/A;  45 seeds implanted      Family History  Problem Relation Age of Onset  . Breast cancer Mother   . Stroke Mother   . Hypertension Mother   . Lung cancer Father   . Esophageal cancer Brother   . Cancer Paternal Uncle     prostate  . Cancer Paternal Grandfather     prostate  . Heart attack Neg Hx     Social history:  reports that he has never smoked. He has never used smokeless tobacco. He reports that he does not drink alcohol or use illicit drugs.  Medications:  Prior to Admission medications   Medication Sig Start Date End Date Taking? Authorizing Provider    acetaminophen (TYLENOL) 500 MG tablet Take 500 mg by mouth every 6 (six) hours as needed for pain.   Yes Historical Provider, MD  amLODipine-valsartan (EXFORGE) 10-320 MG per tablet TAKE 1 TABLET BY MOUTH DAILY. 04/17/14  Yes [name], MD  aspirin 81 MG tablet Take 81 mg by mouth daily.   Yes Historical Provider, MD  fenofibrate 160 MG tablet TAKE 1 TABLET BY MOUTH DAILY 05/17/15  Yes Tiffany A Gann, PA-C  furosemide (LASIX) 20 MG tablet Take 1 tablet (20 mg total) by mouth daily. 05/29/15  Yes Carmin Muskrat, MD  glucose blood (ONE TOUCH ULTRA TEST) test strip USE FOR TESTING SUGAR 2 TIMES DAILY E11.9 06/24/15  Yes [name], MD  MYRBETRIQ 25 MG TB24 tablet Take 25 mg by mouth daily. 03/03/15  Yes Historical Provider, MD  ONE TOUCH ULTRA TEST test strip  04/05/13  Yes Historical Provider, MD  sitaGLIPtin (JANUVIA) 50 MG tablet Take 50 mg by mouth daily.   Yes Historical Provider, MD     No Known Allergies  ROS:  Out of a complete 14 system review of symptoms, the patient complains only of the following symptoms, and all other reviewed systems are negative.  Fatigue Swelling in the legs Shortness of breath, cough, snoring Moles Urinary incontinence, impotence Feeling cold Joint pain Dizziness, passing out Snoring  Blood pressure 136/67, pulse 81, height 5\' 7"  (1.702 m), weight 236 lb 8 oz (107.276 kg).  Physical Exam  General: The patient is alert and cooperative at the time of the examination. The patient is moderately to markedly obese.  Eyes: Pupils are equal, round, and reactive to light. Discs are flat bilaterally.  Neck: The neck is supple, no carotid bruits are noted.  Respiratory: The respiratory examination is clear. He is dyspneic with very minimal activity.  Cardiovascular: The cardiovascular examination reveals a regular rate and rhythm, no obvious murmurs or rubs are noted.  Skin: Extremities are with 2+ edema on the left, 3+ on the right. There appears  be some redness and warmth below the knee on the right.  Neurologic Exam  Mental status: The patient is alert and oriented x 3 at the time of the examination. The patient has apparent normal recent and remote memory, with an apparently normal attention span and concentration ability.  Cranial nerves: Facial symmetry is present. There is good sensation of the face to pinprick and soft touch bilaterally. The strength of the facial muscles and the muscles to head turning and shoulder shrug are normal bilaterally. Speech is well enunciated, no aphasia or dysarthria is noted. Extraocular movements are full. Visual fields are full. The tongue is midline, and the patient has symmetric elevation  of the soft palate. No obvious hearing deficits are noted.  Motor: The motor testing reveals 5 over 5 strength of all 4 extremities. Good symmetric motor tone is noted throughout.  Sensory: Sensory testing is intact to pinprick, soft touch, vibration sensation, and position sense on all 4 extremities. No evidence of extinction is noted.  Coordination: Cerebellar testing reveals good finger-nose-finger and heel-to-shin bilaterally.  Gait and station: Gait is normal. Tandem gait is slightly unsteady. Romberg is negative. No drift is seen.  Reflexes: Deep tendon reflexes are symmetric, but are depressed bilaterally. Toes are downgoing bilaterally.   Assessment/Plan:  1. New onset dizziness with standing, near syncope and syncope, dyspnea on exertion  2. Bilateral lower extremity edema, right greater than left  The patient reports relatively sudden onset of dyspnea on exertion, even with minimal activity. He reports onset of episodes of syncope and near syncope, associated with visual dimming suggesting a lowered perfusion pressure of the brain. The patient has evidence of onset within the last 3 weeks of swelling and redness and warmth of the right leg. I am concerned that the patient may have suffered a  pulmonary embolism resulting in his current symptoms, but we will also pursue a workup to exclude neurologic causes of dizziness and near syncope. The patient will undergo venous Doppler studies of both lower extremities. He will have MRI evaluation of the brain, and carotid Doppler study. I will contact him concerning the results of the above.  Jill Alexanders MD 08/25/2015 11:51 AM  [facility] [address] [place],  [address]  Phone [phone] Fax [phone]

## 2015-08-26 ENCOUNTER — Other Ambulatory Visit: Payer: Self-pay

## 2015-08-26 ENCOUNTER — Encounter: Payer: Self-pay | Admitting: Pharmacist

## 2015-08-26 ENCOUNTER — Ambulatory Visit (INDEPENDENT_AMBULATORY_CARE_PROVIDER_SITE_OTHER): Payer: Medicare Other | Admitting: Pharmacist

## 2015-08-26 ENCOUNTER — Other Ambulatory Visit: Payer: Self-pay | Admitting: Neurology

## 2015-08-26 VITALS — BP 136/74 | HR 88 | Ht 66.5 in | Wt 239.0 lb

## 2015-08-26 DIAGNOSIS — Z0000 Encounter for general adult medical examination without abnormal findings: Secondary | ICD-10-CM | POA: Diagnosis not present

## 2015-08-26 DIAGNOSIS — R55 Syncope and collapse: Secondary | ICD-10-CM

## 2015-08-26 MED ORDER — GLUCOSE BLOOD VI STRP: ORAL_STRIP | Status: DC

## 2015-08-26 NOTE — Patient Instructions (Addendum)
  [name] , Thank you for taking time to come for your Medicare Wellness Visit. I appreciate your ongoing commitment to your health goals. Please review the following plan we discussed and let me know if I can assist you in the future.   These are the goals we discussed: Try to do chair exercise to build core muscles and help with balance.  Continue to limit sugar and carbohydrate intake Make eye appointment for 2017     This is a list of the screening recommended for you and due dates:  Health Maintenance  Topic Date Due  . Eye exam for diabetics  02/17/1951  . Pneumonia vaccines (2 of 2 - PPSV23) 10/09/2014  . Hemoglobin A1C  11/11/2015  . Flu Shot  03/15/2016  . Complete foot exam   05/18/2016  . Stool Blood Test  05/22/2016  . Colon Cancer Screening  06/12/2017  . Tetanus Vaccine  03/15/2021  . Shingles Vaccine  Completed

## 2015-08-26 NOTE — Progress Notes (Signed)
Patient ID: Ricardo Mayer, male   DOB: 10-13-1940, 75 y.o.   MRN: [id number]   Subjective:   Ricardo Mayer is a 75 y.o. male who presents for an Initial Medicare Annual Wellness Visit and recheck DM  Patient is married WM.  He continues to work part time about 24 hours per week.    Patietn checked BG 2 to 4 times per day.  He bring in BG readings and average for Nov - December are as follows: Before Breakfast = 123 After breakfast = 131 Before Lunch = 127 After lunch = 154 Before supper = 124 After supper 160 =  Bedtime = 140   Review of Systems  Review of Systems  Constitutional: Negative.   HENT: Positive for hearing loss (wears hearing aids). Negative for congestion, ear discharge, ear pain, nosebleeds, sore throat and tinnitus.   Eyes: Negative.   Respiratory: Positive for shortness of breath. Negative for stridor.   Cardiovascular: Positive for leg swelling.  Neurological: Positive for dizziness (patient had visit with neurologist yeasteday who is doing work up for dizziness). Negative for tingling, tremors, sensory change, speech change, focal weakness, seizures, loss of consciousness and headaches.  Psychiatric/Behavioral: Negative.       Current Medications (verified) Outpatient Encounter Prescriptions as of 08/26/2015  Medication Sig  . acetaminophen (TYLENOL) 500 MG tablet Take 500 mg by mouth every 6 (six) hours as needed for pain.  Marland Kitchen amLODipine-valsartan (EXFORGE) 10-320 MG per tablet TAKE 1 TABLET BY MOUTH DAILY.  Marland Kitchen aspirin 81 MG tablet Take 81 mg by mouth daily.  . fenofibrate 160 MG tablet TAKE 1 TABLET BY MOUTH DAILY  . furosemide (LASIX) 20 MG tablet Take 1 tablet (20 mg total) by mouth daily.  Marland Kitchen glucose blood (ONE TOUCH ULTRA TEST) test strip USE FOR TESTING SUGAR 2 to 3 TIMES DAILY E11.9  . sitaGLIPtin (JANUVIA) 50 MG tablet Take 50 mg by mouth daily.  . [DISCONTINUED] glucose blood (ONE TOUCH ULTRA TEST) test strip USE FOR TESTING SUGAR 2 TIMES DAILY E11.9    . MYRBETRIQ 25 MG TB24 tablet Take 25 mg by mouth daily. Reported on 08/26/2015  . [DISCONTINUED] ONE TOUCH ULTRA TEST test strip Reported on 08/26/2015   No facility-administered encounter medications on file as of 08/26/2015.    Allergies (verified) Review of patient's allergies indicates no known allergies.   History: Past Medical History  Diagnosis Date  . Hypertension   . Hyperlipidemia   . Adenomatous polyp of colon   . Nocturia   . Arthritis     bilateral knees  . History of DVT of lower extremity     2011--  LEFT LEG  . History of melanoma excision     RIGHT ARM  . History of basal cell cancer   . History of kidney stones   . Prediabetes     DIET CONTROLLED  . Ulcer of left lower leg ([place])     recent pcp visit dr Laurance Flatten--  ordered betadine wet/dry dsg daily (03-18-2013)  [place] 1 WEEK  . CKD (chronic kidney disease), stage II   . Diabetes mellitus without complication ([place])   . Near syncope 08/25/2015  . Prostate cancer ([place]) 2014  . Melanoma ([place]) 1990   Past Surgical History  Procedure Laterality Date  . Prostate biopsy    . Tonsillectomy    . Kidney stone extractions  LAST ONE 1970    X2 OPEN/  X1  URETEROSCOPY  . Melanoma  excision right arm  1990  . Radioactive seed implant N/A 03/21/2013    Procedure: RADIOACTIVE SEED IMPLANT;  Surgeon: Malka So, MD;  Location: [facility];  Service: Urology;  Laterality: N/A;  25 seeds implanted     Family History  Problem Relation Age of Onset  . Breast cancer Mother   . Stroke Mother   . Hypertension Mother   . Lung cancer Father   . Ulcers Father   . Cancer Paternal Uncle     prostate  . Cancer Paternal Grandfather     prostate  . Heart attack Neg Hx   . Esophageal cancer Brother    Social History   Occupational History  . Retail buyer    Social History Main Topics  . Smoking status: Never Smoker   . Smokeless tobacco: Never Used  . Alcohol Use: No  .  Drug Use: No  . Sexual Activity: No    Do you feel safe at home?  Yes  Dietary issues and exercise activities discussed: Current Exercise Habits:: Exercise is limited by, Limited by:: neurologic condition(s)  Current Dietary habits:  Patient limits CHO intake and does not salt any foods.  He also limits sweets.    Cardiac Risk Factors include: advanced age (>33men, >72 women);diabetes mellitus;dyslipidemia;male gender;hypertension;obesity (BMI >30kg/m2);sedentary lifestyle  Objective:    Today's Vitals   08/26/15 1511  BP: 136/74  Pulse: 88  Height: 5' 6.5" (1.689 m)  Weight: 239 lb (108.41 kg)  PainSc: 0-No pain   Body mass index is 38 kg/(m^2).   Activities of Daily Living In your present state of health, do you have any difficulty performing the following activities: 08/26/2015  Hearing? Y  Vision? N  Difficulty concentrating or making decisions? N  Walking or climbing stairs? Y  Dressing or bathing? N  Doing errands, shopping? N  Preparing Food and eating ? N  Using the Toilet? N  In the past six months, have you accidently leaked urine? N  Do you have problems with loss of bowel control? N  Managing your Medications? N  Managing your Finances? N  Housekeeping or managing your Housekeeping? N    Are there smokers in your home (other than you)? No    Depression Screen PHQ 2/9 Scores 08/26/2015 08/05/2015 05/19/2015 05/07/2015  PHQ - 2 Score 0 0 0 0    Fall Risk Fall Risk  08/26/2015 08/05/2015 05/19/2015 05/07/2015 01/30/2015  Falls in the past year? Yes No Yes No No  Number falls in past yr: 1 - 1 - -  Injury with Fall? No - No - -  Follow up Falls prevention discussed - - - -    Cognitive Function: MMSE - Mini Mental State Exam 08/26/2015 08/26/2015  Orientation to time 5 5  Orientation to Place 5 5  Registration 3 -  Attention/ Calculation 5 -  Recall 3 -  Language- name 2 objects 2 -  Language- repeat 1 -  Language- follow 3 step command 3 -  Language-  read & follow direction 1 -  Write a sentence 1 -  Copy design 1 -  Total score 30 -    Immunizations and Health Maintenance Immunization History  Administered Date(s) Administered  . Influenza-Unspecified 05/15/2013, 05/15/2014, 06/09/2015  . Pneumococcal Conjugate-13 10/09/2013   Health Maintenance Due  Topic Date Due  . OPHTHALMOLOGY EXAM  02/17/1951  . PNA vac Low Risk Adult (2 of 2 - PPSV23) 10/09/2014    Patient Care Team:  [name], MD as PCP - General (Family Medicine) Kathrynn Ducking, MD as Consulting Physician (Neurology) Jettie Booze, MD as Consulting Physician (Cardiology) Warren Danes, PA-C as Physician Assistant (Dermatology) Elmarie Shiley, MD as Consulting Physician (Nephrology) Irine Seal, MD as Attending Physician (Urology)  Indicate any recent Medical Services you may have received from other than [facility] providers in the past year (date may be approximate).    Assessment:    Annual Wellness Visit [facility]EE - plan was to get [name] - appt 08/31/15 - would like to get sooner Dizziness - MRI scheduled for 09/02/15 Type 2 DM - controlled.  Obesity    Screening Tests Health Maintenance  Topic Date Due  . OPHTHALMOLOGY EXAM  02/17/1951  . PNA vac Low Risk Adult (2 of 2 - PPSV23) 10/09/2014  . HEMOGLOBIN A1C  11/11/2015  . INFLUENZA VACCINE  03/15/2016  . FOOT EXAM  05/18/2016  . COLON CANCER SCREENING ANNUAL FOBT  05/22/2016  . COLONOSCOPY  06/12/2017  . TETANUS/TDAP  03/15/2021  . ZOSTAVAX  Completed        Plan:   During the course of the visit Kyrese was educated and counseled about the following appropriate screening and preventive services:   Vaccines to include Pneumoccal, Influenza,  Td, Zostavax - vaccines are UTD  Colorectal cancer screening - UTD on both colonoscopy and FOBT  Cardiovascular disease screening - ECHO and EKG UTD  Had Carlon in our referral department call [facility] and [name] has been moved up to tomorrow at  2:15pm  Diabetes - continue Januvia 50mg  once daily.  I did talk to patient about monitoring Scr and suggested change to Tradjenta which is in same therapeutic class as Januvia but does not require dose adjustment based on renal function.  Patient states he has 2 months of Januvia.  He is to see Dr Laurance Flatten in February and have labs drawn.  Will consider change at that time.  Continue to check BG 2 to 3 times daily  Glaucoma screening / Diabetic Eye Exam - requested copy of last eye exam from [facility] at [facility].    Nutrition counseling - reviewed low CHO diet and CHO counting  Prostate cancer screening - patient sees Dr Jeffie Pollock regularly for follow up post prostate cancer   Advanced Directives - is meeting with lawyer tomorrow to discuss this.  Also gave patient Chair exercises to try.  Do not want he to do standing exercises until work up for dizziness is complete and OK'd by neurologist / PCP.    Patient Instructions (the written plan) were given to the patient.   Cherre Robins, [facility]   08/26/2015

## 2015-08-27 ENCOUNTER — Other Ambulatory Visit: Payer: Self-pay

## 2015-08-27 ENCOUNTER — Telehealth: Payer: Self-pay | Admitting: Neurology

## 2015-08-27 ENCOUNTER — Ambulatory Visit (HOSPITAL_COMMUNITY)
Admission: RE | Admit: 2015-08-27 | Discharge: 2015-08-27 | Disposition: A | Discharge status: Home / Self Care | Payer: Medicare Other | Source: Ambulatory Visit | Attending: Neurology | Admitting: Neurology

## 2015-08-27 ENCOUNTER — Inpatient Hospital Stay (HOSPITAL_COMMUNITY)
Admission: EM | Admit: 2015-08-27 | Discharge: 2015-08-29 | DRG: 176 | Disposition: A | Discharge status: Home / Self Care | Payer: Medicare Other | Attending: Internal Medicine | Admitting: Internal Medicine

## 2015-08-27 ENCOUNTER — Other Ambulatory Visit: Payer: Self-pay | Admitting: Neurology

## 2015-08-27 ENCOUNTER — Encounter (HOSPITAL_COMMUNITY): Payer: Self-pay | Admitting: *Deleted

## 2015-08-27 ENCOUNTER — Inpatient Hospital Stay (HOSPITAL_COMMUNITY): Payer: Medicare Other

## 2015-08-27 DIAGNOSIS — I2699 Other pulmonary embolism without acute cor pulmonale: Secondary | ICD-10-CM

## 2015-08-27 DIAGNOSIS — I824Z1 Acute embolism and thrombosis of unspecified deep veins of right distal lower extremity: Secondary | ICD-10-CM

## 2015-08-27 DIAGNOSIS — R55 Syncope and collapse: Secondary | ICD-10-CM

## 2015-08-27 DIAGNOSIS — Z86718 Personal history of other venous thrombosis and embolism: Secondary | ICD-10-CM | POA: Diagnosis not present

## 2015-08-27 DIAGNOSIS — Z8249 Family history of ischemic heart disease and other diseases of the circulatory system: Secondary | ICD-10-CM | POA: Diagnosis not present

## 2015-08-27 DIAGNOSIS — E785 Hyperlipidemia, unspecified: Secondary | ICD-10-CM | POA: Diagnosis present

## 2015-08-27 DIAGNOSIS — Z800 Family history of malignant neoplasm of digestive organs: Secondary | ICD-10-CM

## 2015-08-27 DIAGNOSIS — E669 Obesity, unspecified: Secondary | ICD-10-CM | POA: Diagnosis not present

## 2015-08-27 DIAGNOSIS — Z85820 Personal history of malignant melanoma of skin: Secondary | ICD-10-CM

## 2015-08-27 DIAGNOSIS — E1122 Type 2 diabetes mellitus with diabetic chronic kidney disease: Secondary | ICD-10-CM | POA: Diagnosis not present

## 2015-08-27 DIAGNOSIS — Z8546 Personal history of malignant neoplasm of prostate: Secondary | ICD-10-CM | POA: Diagnosis not present

## 2015-08-27 DIAGNOSIS — Z87442 Personal history of urinary calculi: Secondary | ICD-10-CM | POA: Diagnosis not present

## 2015-08-27 DIAGNOSIS — Z801 Family history of malignant neoplasm of trachea, bronchus and lung: Secondary | ICD-10-CM

## 2015-08-27 DIAGNOSIS — Z6836 Body mass index (BMI) 36.0-36.9, adult: Secondary | ICD-10-CM | POA: Diagnosis not present

## 2015-08-27 DIAGNOSIS — Z823 Family history of stroke: Secondary | ICD-10-CM | POA: Diagnosis not present

## 2015-08-27 DIAGNOSIS — N184 Chronic kidney disease, stage 4 (severe): Secondary | ICD-10-CM | POA: Diagnosis present

## 2015-08-27 DIAGNOSIS — C439 Malignant melanoma of skin, unspecified: Secondary | ICD-10-CM | POA: Diagnosis present

## 2015-08-27 DIAGNOSIS — C4362 Malignant melanoma of left upper limb, including shoulder: Secondary | ICD-10-CM | POA: Diagnosis not present

## 2015-08-27 DIAGNOSIS — I82402 Acute embolism and thrombosis of unspecified deep veins of left lower extremity: Secondary | ICD-10-CM | POA: Diagnosis not present

## 2015-08-27 DIAGNOSIS — I6523 Occlusion and stenosis of bilateral carotid arteries: Secondary | ICD-10-CM | POA: Diagnosis not present

## 2015-08-27 DIAGNOSIS — I129 Hypertensive chronic kidney disease with stage 1 through stage 4 chronic kidney disease, or unspecified chronic kidney disease: Secondary | ICD-10-CM | POA: Diagnosis present

## 2015-08-27 DIAGNOSIS — R0600 Dyspnea, unspecified: Secondary | ICD-10-CM | POA: Diagnosis not present

## 2015-08-27 DIAGNOSIS — N289 Disorder of kidney and ureter, unspecified: Secondary | ICD-10-CM

## 2015-08-27 DIAGNOSIS — I82401 Acute embolism and thrombosis of unspecified deep veins of right lower extremity: Secondary | ICD-10-CM | POA: Diagnosis present

## 2015-08-27 DIAGNOSIS — K7689 Other specified diseases of liver: Secondary | ICD-10-CM | POA: Diagnosis not present

## 2015-08-27 DIAGNOSIS — C61 Malignant neoplasm of prostate: Secondary | ICD-10-CM | POA: Diagnosis present

## 2015-08-27 DIAGNOSIS — O2230 Deep phlebothrombosis in pregnancy, unspecified trimester: Secondary | ICD-10-CM

## 2015-08-27 DIAGNOSIS — N189 Chronic kidney disease, unspecified: Secondary | ICD-10-CM | POA: Diagnosis not present

## 2015-08-27 DIAGNOSIS — I10 Essential (primary) hypertension: Secondary | ICD-10-CM | POA: Diagnosis not present

## 2015-08-27 DIAGNOSIS — M1990 Unspecified osteoarthritis, unspecified site: Secondary | ICD-10-CM | POA: Diagnosis present

## 2015-08-27 DIAGNOSIS — N183 Chronic kidney disease, stage 3 (moderate): Secondary | ICD-10-CM | POA: Diagnosis present

## 2015-08-27 DIAGNOSIS — E1159 Type 2 diabetes mellitus with other circulatory complications: Secondary | ICD-10-CM

## 2015-08-27 DIAGNOSIS — Z7984 Long term (current) use of oral hypoglycemic drugs: Secondary | ICD-10-CM | POA: Diagnosis not present

## 2015-08-27 LAB — CBC WITH DIFFERENTIAL/PLATELET
Basophils Absolute: 0 10*3/uL (ref 0.0–0.1)
Basophils Relative: 0 %
EOS ABS: 0.1 10*3/uL (ref 0.0–0.7)
EOS PCT: 1 %
HCT: 38.8 % — ABNORMAL LOW (ref 39.0–52.0)
HEMOGLOBIN: 13 g/dL (ref 13.0–17.0)
LYMPHS ABS: 2 10*3/uL (ref 0.7–4.0)
Lymphocytes Relative: 24 %
MCH: 28.8 pg (ref 26.0–34.0)
MCHC: 33.5 g/dL (ref 30.0–36.0)
MCV: 86 fL (ref 78.0–100.0)
MONOS PCT: 9 %
Monocytes Absolute: 0.7 10*3/uL (ref 0.1–1.0)
NEUTROS PCT: 66 %
Neutro Abs: 5.6 10*3/uL (ref 1.7–7.7)
Platelets: 170 10*3/uL (ref 150–400)
RBC: 4.51 MIL/uL (ref 4.22–5.81)
RDW: 14.2 % (ref 11.5–15.5)
WBC: 8.4 10*3/uL (ref 4.0–10.5)

## 2015-08-27 LAB — PROTIME-INR
INR: 1.12 (ref 0.00–1.49)
Prothrombin Time: 14.6 seconds (ref 11.6–15.2)

## 2015-08-27 LAB — GLUCOSE, CAPILLARY: Glucose-Capillary: 147 mg/dL — ABNORMAL HIGH (ref 65–99)

## 2015-08-27 LAB — BASIC METABOLIC PANEL
Anion gap: 9 (ref 5–15)
BUN: 27 mg/dL — AB (ref 6–20)
CHLORIDE: 108 mmol/L (ref 101–111)
CO2: 22 mmol/L (ref 22–32)
CREATININE: 1.82 mg/dL — AB (ref 0.61–1.24)
Calcium: 9.2 mg/dL (ref 8.9–10.3)
GFR calc Af Amer: 40 mL/min — ABNORMAL LOW (ref >60)
GFR calc non Af Amer: 35 mL/min — ABNORMAL LOW (ref >60)
GLUCOSE: 114 mg/dL — AB (ref 65–99)
Potassium: 4.3 mmol/L (ref 3.5–5.1)
Sodium: 139 mmol/L (ref 135–145)

## 2015-08-27 LAB — POCT I-STAT CREATININE: CREATININE: 1.9 mg/dL — AB (ref 0.61–1.24)

## 2015-08-27 LAB — TROPONIN I

## 2015-08-27 MED ORDER — HEPARIN (PORCINE) IN NACL 100-0.45 UNIT/ML-% IJ SOLN
1400.0000 [IU]/h | INTRAMUSCULAR | Status: DC
  Administered 2015-08-27: 1400 [IU]/h via INTRAVENOUS
  Filled 2015-08-27: qty 250

## 2015-08-27 MED ORDER — SODIUM CHLORIDE 0.9 % IV SOLN
INTRAVENOUS | Status: DC
  Administered 2015-08-27: 19:00:00 via INTRAVENOUS

## 2015-08-27 MED ORDER — ASPIRIN 81 MG PO CHEW
81.0000 mg | CHEWABLE_TABLET | Freq: Every day | ORAL | Status: DC
  Administered 2015-08-28: 81 mg via ORAL
  Filled 2015-08-27 (×2): qty 1

## 2015-08-27 MED ORDER — AMLODIPINE BESYLATE 5 MG PO TABS
10.0000 mg | ORAL_TABLET | Freq: Every day | ORAL | Status: DC
  Administered 2015-08-28 – 2015-08-29 (×2): 10 mg via ORAL
  Filled 2015-08-27 (×2): qty 2

## 2015-08-27 MED ORDER — ACETAMINOPHEN 500 MG PO TABS: 500.0000 mg | ORAL_TABLET | Freq: Four times a day (QID) | ORAL | Status: DC | PRN

## 2015-08-27 MED ORDER — HEPARIN BOLUS VIA INFUSION
4000.0000 [IU] | Freq: Once | INTRAVENOUS | Status: AC
  Administered 2015-08-27: 4000 [IU] via INTRAVENOUS

## 2015-08-27 MED ORDER — FENOFIBRATE 160 MG PO TABS
160.0000 mg | ORAL_TABLET | Freq: Every day | ORAL | Status: DC
  Administered 2015-08-28 – 2015-08-29 (×2): 160 mg via ORAL
  Filled 2015-08-27 (×2): qty 1

## 2015-08-27 MED ORDER — ONDANSETRON HCL 4 MG/2ML IJ SOLN: 4.0000 mg | Freq: Four times a day (QID) | INTRAMUSCULAR | Status: DC | PRN

## 2015-08-27 MED ORDER — IOHEXOL 350 MG/ML SOLN
70.0000 mL | Freq: Once | INTRAVENOUS | Status: AC | PRN
  Administered 2015-08-27: 70 mL via INTRAVENOUS

## 2015-08-27 MED ORDER — ENOXAPARIN SODIUM 40 MG/0.4ML ~~LOC~~ SOLN: 40.0000 mg | Freq: Two times a day (BID) | SUBCUTANEOUS | Status: DC

## 2015-08-27 MED ORDER — SODIUM CHLORIDE 0.9 % IJ SOLN
3.0000 mL | Freq: Two times a day (BID) | INTRAMUSCULAR | Status: DC
Start: 2015-08-27 — End: 2015-08-29

## 2015-08-27 MED ORDER — IRBESARTAN 300 MG PO TABS
300.0000 mg | ORAL_TABLET | Freq: Every day | ORAL | Status: DC
Start: 2015-08-28 — End: 2015-08-29
  Administered 2015-08-28 – 2015-08-29 (×2): 300 mg via ORAL
  Filled 2015-08-27 (×2): qty 1

## 2015-08-27 MED ORDER — ONDANSETRON HCL 4 MG PO TABS: 4.0000 mg | ORAL_TABLET | Freq: Four times a day (QID) | ORAL | Status: DC | PRN

## 2015-08-27 MED ORDER — INSULIN ASPART 100 UNIT/ML ~~LOC~~ SOLN: 0.0000 [IU] | Freq: Every day | SUBCUTANEOUS | Status: DC

## 2015-08-27 MED ORDER — INSULIN ASPART 100 UNIT/ML ~~LOC~~ SOLN: 0.0000 [IU] | Freq: Three times a day (TID) | SUBCUTANEOUS | Status: DC

## 2015-08-27 MED ORDER — AMLODIPINE BESYLATE-VALSARTAN 10-320 MG PO TABS: 1.0000 | ORAL_TABLET | Freq: Every day | ORAL | Status: DC

## 2015-08-27 MED ORDER — LINAGLIPTIN 5 MG PO TABS
5.0000 mg | ORAL_TABLET | Freq: Every day | ORAL | Status: DC
  Administered 2015-08-28 – 2015-08-29 (×2): 5 mg via ORAL
  Filled 2015-08-27 (×2): qty 1

## 2015-08-27 MED ORDER — MIRABEGRON ER 25 MG PO TB24
25.0000 mg | ORAL_TABLET | Freq: Every day | ORAL | Status: DC
  Administered 2015-08-28 – 2015-08-29 (×2): 25 mg via ORAL
  Filled 2015-08-27 (×2): qty 1

## 2015-08-27 MED ORDER — FUROSEMIDE 20 MG PO TABS
20.0000 mg | ORAL_TABLET | Freq: Every day | ORAL | Status: DC
  Administered 2015-08-28 – 2015-08-29 (×2): 20 mg via ORAL
  Filled 2015-08-27 (×2): qty 1

## 2015-08-27 NOTE — ED Provider Notes (Signed)
CSN: [id number]     Arrival date & time 08/27/15  1608 History   First MD Initiated Contact with Patient 08/27/15 1615     Chief Complaint  Patient presents with  . DVT  . Shortness of Breath      HPI Pt was seen at 1630. Per pt, c/o gradual onset and persistence of constant RLE "swelling" for the past 3 weeks. Pt states he "passed out" 3 months ago, and has had intermittent SOB since that time. SOB worsens on exertion. Pt states up until 3 weeks ago, both his legs "were swollen." Pt was evaluated by Cards MD and Neuro MD for his symptoms. Pt was told to come to the ED today after completing outpatient CT-A chest and Vasc US RLE which were +PE and +DVT. Denies CP/palpitations, no cough, no fevers, no abd pain, no N/V/D, no focal motor weakness, no tingling/numbness in extremities.    Past Medical History  Diagnosis Date  . Hypertension   . Hyperlipidemia   . Adenomatous polyp of colon   . Nocturia   . Arthritis     bilateral knees  . History of DVT of lower extremity     2011--  LEFT LEG  . History of melanoma excision     RIGHT ARM  . History of basal cell cancer   . History of kidney stones   . Prediabetes     DIET CONTROLLED  . Ulcer of left lower leg ([place])     recent pcp visit dr Laurance Flatten--  ordered betadine wet/dry dsg daily (03-18-2013)  [place] 1 WEEK  . CKD (chronic kidney disease), stage II   . Diabetes mellitus without complication ([place])   . Near syncope 08/25/2015  . Prostate cancer ([place]) 2014  . Melanoma ([place]) 1990   Past Surgical History  Procedure Laterality Date  . Prostate biopsy    . Tonsillectomy    . Kidney stone extractions  LAST ONE 1970    X2 OPEN/  X1  URETEROSCOPY  . Melanoma excision right arm  1990  . Radioactive seed implant N/A 03/21/2013    Procedure: RADIOACTIVE SEED IMPLANT;  Surgeon: Malka So, MD;  Location: [facility];  Service: Urology;  Laterality: N/A;  74 seeds implanted     Family History   Problem Relation Age of Onset  . Breast cancer Mother   . Stroke Mother   . Hypertension Mother   . Lung cancer Father   . Ulcers Father   . Cancer Paternal Uncle     prostate  . Cancer Paternal Grandfather     prostate  . Heart attack Neg Hx   . Esophageal cancer Brother    Social History  Substance Use Topics  . Smoking status: Never Smoker   . Smokeless tobacco: Never Used  . Alcohol Use: No    Review of Systems ROS: Statement: All systems negative except as marked or noted in the HPI; Constitutional: Negative for fever and chills. ; ; Eyes: Negative for eye pain, redness and discharge. ; ; ENMT: Negative for ear pain, hoarseness, nasal congestion, sinus pressure and sore throat. ; ; Cardiovascular: Negative for chest pain, palpitations, diaphoresis, +dyspnea and peripheral edema. ; ; Respiratory: Negative for cough, wheezing and stridor. ; ; Gastrointestinal: Negative for nausea, vomiting, diarrhea, abdominal pain, blood in stool, hematemesis, jaundice and rectal bleeding. . ; ; Genitourinary: Negative for dysuria, flank pain and hematuria. ; ; Musculoskeletal: Negative for back pain and  neck pain. Negative for trauma.; ; Skin: Negative for pruritus, rash, abrasions, blisters, bruising and skin lesion.; ; Neuro: Negative for headache, lightheadedness and neck stiffness. Negative for weakness, altered level of consciousness , altered mental status, extremity weakness, paresthesias, involuntary movement, seizure and syncope.      Allergies  Review of patient's allergies indicates no known allergies.  Home Medications   Prior to Admission medications   Medication Sig Start Date End Date Taking? Authorizing Provider  acetaminophen (TYLENOL) 500 MG tablet Take 500 mg by mouth every 6 (six) hours as needed for pain.    Historical Provider, MD  amLODipine-valsartan (EXFORGE) 10-320 MG per tablet TAKE 1 TABLET BY MOUTH DAILY. 04/17/14   [name], MD  aspirin 81 MG tablet Take 81  mg by mouth daily.    Historical Provider, MD  enoxaparin (LOVENOX) 40 MG/0.4ML injection Inject 0.4 mLs (40 mg total) into the skin every 12 (twelve) hours. 08/27/15   Kathrynn Ducking, MD  fenofibrate 160 MG tablet TAKE 1 TABLET BY MOUTH DAILY 05/17/15   Tiffany A Gann, PA-C  furosemide (LASIX) 20 MG tablet Take 1 tablet (20 mg total) by mouth daily. 05/29/15   Carmin Muskrat, MD  glucose blood (ONE TOUCH ULTRA TEST) test strip USE FOR TESTING SUGAR 2 to 3 TIMES DAILY E11.9 08/26/15   [name], MD  MYRBETRIQ 25 MG TB24 tablet Take 25 mg by mouth daily. Reported on 08/26/2015 03/03/15   Historical Provider, MD  sitaGLIPtin (JANUVIA) 50 MG tablet Take 50 mg by mouth daily.    Historical Provider, MD   BP 140/68 mmHg  Pulse 77  Temp(Src) 97.6 F (36.4 C) (Oral)  Resp 15  Ht 5\' 7"  (1.702 m)  Wt 236 lb (107.049 kg)  BMI 36.95 kg/m2  SpO2 98% Physical Exam  1635: Physical examination:  Nursing notes reviewed; Vital signs and O2 SAT reviewed;  Constitutional: Well developed, Well nourished, Well hydrated, In no acute distress; Head:  Normocephalic, atraumatic; Eyes: EOMI, PERRL, No scleral icterus; ENMT: Mouth and pharynx normal, Mucous membranes moist; Neck: Supple, Full range of motion, No lymphadenopathy; Cardiovascular: Regular rate and rhythm, No gallop; Respiratory: Breath sounds clear & equal bilaterally, No wheezes.  Speaking full sentences with ease, Normal respiratory effort/excursion; Chest: Nontender, Movement normal; Abdomen: Soft, Nontender, Nondistended, Normal bowel sounds; Genitourinary: No CVA tenderness; Extremities: Pulses normal, No tenderness, +2 RLE edema foot to thigh with calf asymmetry and erythema to lower anterior leg. +bilat LE's chronic stasis skin changes. ; Neuro: AA&Ox3, Major CN grossly intact.  Speech clear. No gross focal motor or sensory deficits in extremities.; Skin: Color normal, Warm, Dry.   ED Course  Procedures (including critical care time) Labs  Review   Imaging Review  I have personally reviewed and evaluated these images and lab results as part of my medical decision-making.   EKG Interpretation None        MDM  MDM Reviewed: previous chart, nursing note and vitals Reviewed previous: labs, ECG, ultrasound and CT scan Interpretation: labs and ECG Total time providing critical care: 30-74 minutes. This excludes time spent performing separately reportable procedures and services. Consults: admitting MD   CRITICAL CARE Performed by: Alfonzo Feller Total critical care time: 35 minutes Critical care time was exclusive of separately billable procedures and treating other patients. Critical care was necessary to treat or prevent imminent or life-threatening deterioration. Critical care was time spent personally by me on the following activities: development of treatment plan with patient  and/or surrogate as well as nursing, discussions with consultants, evaluation of patient's response to treatment, examination of patient, obtaining history from patient or surrogate, ordering and performing treatments and interventions, ordering and review of laboratory studies, ordering and review of radiographic studies, pulse oximetry and re-evaluation of patient's condition.     ED ECG REPORT   Date: 08/27/2015  Rate: 80  Rhythm: normal sinus rhythm  QRS Axis: left  Intervals: normal  ST/T Wave abnormalities: normal  Conduction Disutrbances:nonspecific intraventricular conduction delay  Narrative Interpretation:   Old EKG Reviewed: changes noted; compared to EKG dated 05/29/2015, previous anterior TWA no longer present.  Results for orders placed or performed during the hospital encounter of [id number]  Basic metabolic panel  Result Value Ref Range   Sodium 139 135 - 145 mmol/L   Potassium 4.3 3.5 - 5.1 mmol/L   Chloride 108 101 - 111 mmol/L   CO2 22 22 - 32 mmol/L   Glucose, Bld 114 (H) 65 - 99 mg/dL   BUN 27 (H) 6 - 20  mg/dL   Creatinine, Ser 1.82 (H) 0.61 - 1.24 mg/dL   Calcium 9.2 8.9 - 10.3 mg/dL   GFR calc non Af Amer 35 (L) >60 mL/min   GFR calc Af Amer 40 (L) >60 mL/min   Anion gap 9 5 - 15  CBC with Differential  Result Value Ref Range   WBC 8.4 4.0 - 10.5 K/uL   RBC 4.51 4.22 - 5.81 MIL/uL   Hemoglobin 13.0 13.0 - 17.0 g/dL   HCT 38.8 (L) 39.0 - 52.0 %   MCV 86.0 78.0 - 100.0 fL   MCH 28.8 26.0 - 34.0 pg   MCHC 33.5 30.0 - 36.0 g/dL   RDW 14.2 11.5 - 15.5 %   Platelets 170 150 - 400 K/uL   Neutrophils Relative % 66 %   Neutro Abs 5.6 1.7 - 7.7 K/uL   Lymphocytes Relative 24 %   Lymphs Abs 2.0 0.7 - 4.0 K/uL   Monocytes Relative 9 %   Monocytes Absolute 0.7 0.1 - 1.0 K/uL   Eosinophils Relative 1 %   Eosinophils Absolute 0.1 0.0 - 0.7 K/uL   Basophils Relative 0 %   Basophils Absolute 0.0 0.0 - 0.1 K/uL  Troponin I  Result Value Ref Range   Troponin I <0.03 <0.031 ng/mL  Protime-INR  Result Value Ref Range   Prothrombin Time 14.6 11.6 - 15.2 seconds   INR 1.12 0.00 - 1.49   Ct Angio Chest Pe W/cm &/or Wo Cm 08/27/2015  CLINICAL DATA:  Shortness of breath EXAM: CT ANGIOGRAPHY CHEST WITH CONTRAST TECHNIQUE: Multidetector CT imaging of the chest was performed using the standard protocol during bolus administration of intravenous contrast. Multiplanar CT image reconstructions and MIPs were obtained to evaluate the vascular anatomy. CONTRAST:  43mL OMNIPAQUE IOHEXOL 350 MG/ML SOLN COMPARISON:  None. FINDINGS: THORACIC INLET/BODY WALL: No acute abnormality. MEDIASTINUM: There is extensive bilateral pulmonary embolism including saddle embolus. Bulky clot especially at the lobar level, occlusive to the right middle and lower lobes. Some of the clot appears acute and central, elsewhere there is a peripheral appearance. Borderline right heart strain with RV to LV ratio of 0.9. No acute aortic syndrome. Borderline cardiomegaly. No pericardial effusion. LUNG WINDOWS: No evidence of lung infarct or  heart failure. Right lower lobe calcification. UPPER ABDOMEN: Steatotic appearance of the liver. OSSEOUS: No acute fracture.  No suspicious lytic or blastic lesions. Critical Value/emergent results were called by telephone at the time  of interpretation on 08/27/2015 at 4:20 pm to Dr. Margette Fast , who verbally acknowledged these results. Dr. Jannifer Franklin requests transfer to the ER which has been completed. Review of the MIP images confirms the above findings. IMPRESSION: Positive for acute and subacute pulmonary embolism with borderline measurements for right heart strain (RV/LV Ratio =.9) which would indicate submassive (intermediate risk) PE and be associated with an increased risk of morbidity and mortality. Depending on clinical circumstances, consider activating Code PE by paging [phone]. Electronically Signed   By: Monte Fantasia M.D.   On: 08/27/2015 16:21   US Venous Img Lower Bilateral 08/27/2015  CLINICAL DATA:  Near syncope EXAM: BILATERAL LOWER EXTREMITY VENOUS DOPPLER ULTRASOUND TECHNIQUE: Gray-scale sonography with graded compression, as well as color Doppler and duplex ultrasound were performed to evaluate the lower extremity deep venous systems from the level of the common femoral vein and including the common femoral, femoral, profunda femoral, popliteal and calf veins including the posterior tibial, peroneal and gastrocnemius veins when visible. The superficial great saphenous vein was also interrogated. Spectral Doppler was utilized to evaluate flow at rest and with distal augmentation maneuvers in the common femoral, femoral and popliteal veins. COMPARISON:  None. FINDINGS: RIGHT LOWER EXTREMITY Mildly echogenic clot, predominantly occlusive throughout the femoral vein extending into the popliteal and upper calf veins. The clot appears recent with luminal expansion. Normal respiratory phasicity in the right common femoral vein. LEFT LOWER EXTREMITY Common Femoral Vein: No evidence of  thrombus. Normal compressibility, respiratory phasicity and response to augmentation. Saphenofemoral Junction: No evidence of thrombus. Profunda Femoral Vein: No evidence of thrombus. Femoral Vein: No evidence of thrombus. Popliteal Vein: No evidence of thrombus. Calf Veins: No evidence of thrombus. Superficial Great Saphenous Vein: No evidence of thrombus. See chest CT report concerning communication. IMPRESSION: Extensive occlusive thrombus throughout the right femoral vein, popliteal vein, and upper calf veins. Electronically Signed   By: Monte Fantasia M.D.   On: 08/27/2015 16:07    Results for KENON, PAIK (MRN [id number]) as of 08/27/2015 17:59  Ref. Range 05/14/2015 09:01 05/29/2015 11:15 06/29/2015 11:18 08/27/2015 15:42 08/27/2015 16:45  BUN Latest Ref Range: 6-20 mg/dL 31 (H) 42 (H) 33 (H)  27 (H)  Creatinine Latest Ref Range: 0.61-1.24 mg/dL 2.20 (H) 2.28 (H) 1.99 (H) 1.90 (H) 1.82 (H)    1810:  IV heparin started. Dx and testing d/w pt and family.  Questions answered.  Verb understanding, agreeable to admit. T/C to [facility]. Anastasio Champion, case discussed, including:  HPI, pertinent PM/SHx, VS/PE, dx testing, ED course and treatment:  Agreeable to admit, requests to write temporary orders, obtain tele bed to team APAdmits.    Francine Graven, DO 08/29/15 1746

## 2015-08-27 NOTE — H&P (Signed)
[facility] History and Physical  Ricardo Mayer [id number] DOB: May 07, 1941 DOA: 08/27/2015  Referring physician: ER PCP: [name], MD   Chief Complaint: Dyspnea, leg swelling  HPI: Ricardo Mayer is a 74 y.o. male  This is a 75 year old man whose had approximately 2 month history of episodes of dyspnea associated with fatigue and dizziness and near syncopal events. He was investigated by cardiology and investigations were negative. He was therefore sent to the neurologist and the neurologist saw that his legs have become swollen, especially the right leg. He therefore was sent for venous Dopplers and CT angiogram of the chest. This confirmed the presence of a right leg DVT and multiple pleural pulmonary emboli and he is now being admitted for further investigations and management. The patient denies any cough, hemoptysis, fever. He does have a history of melanoma in the remote past. He also has had a DVT in the left leg previously.   Review of Systems:    apart from symptoms above, all systems are negative.   Past Medical History  Diagnosis Date  . Hypertension   . Hyperlipidemia   . Adenomatous polyp of colon   . Nocturia   . Arthritis     bilateral knees  . History of DVT of lower extremity     2011--  LEFT LEG  . History of melanoma excision     RIGHT ARM  . History of basal cell cancer   . History of kidney stones   . Prediabetes     DIET CONTROLLED  . Ulcer of left lower leg ([place])     recent pcp visit dr Laurance Flatten--  ordered betadine wet/dry dsg daily (03-18-2013)  [place] 1 WEEK  . CKD (chronic kidney disease), stage II   . Diabetes mellitus without complication ([place])   . Near syncope 08/25/2015  . Prostate cancer ([place]) 2014  . Melanoma ([place]) 1990   Past Surgical History  Procedure Laterality Date  . Prostate biopsy    . Tonsillectomy    . Kidney stone extractions  LAST ONE 1970    X2 OPEN/  X1  URETEROSCOPY  . Melanoma  excision right arm  1990  . Radioactive seed implant N/A 03/21/2013    Procedure: RADIOACTIVE SEED IMPLANT;  Surgeon: Malka So, MD;  Location: [facility];  Service: Urology;  Laterality: N/A;  87 seeds implanted     Social History:  reports that he has never smoked. He has never used smokeless tobacco. He reports that he does not drink alcohol or use illicit drugs.  No Known Allergies  Family History  Problem Relation Age of Onset  . Breast cancer Mother   . Stroke Mother   . Hypertension Mother   . Lung cancer Father   . Ulcers Father   . Cancer Paternal Uncle     prostate  . Cancer Paternal Grandfather     prostate  . Heart attack Neg Hx   . Esophageal cancer Brother     Prior to Admission medications   Medication Sig Start Date End Date Taking? Authorizing Provider  acetaminophen (TYLENOL) 500 MG tablet Take 500 mg by mouth every 6 (six) hours as needed for pain.   Yes Historical Provider, MD  amLODipine-valsartan (EXFORGE) 10-320 MG per tablet TAKE 1 TABLET BY MOUTH DAILY. 04/17/14  Yes [name], MD  aspirin 81 MG tablet Take 81 mg by mouth daily.   Yes Historical Provider, MD  fenofibrate  160 MG tablet TAKE 1 TABLET BY MOUTH DAILY 05/17/15  Yes Tiffany A Gann, PA-C  furosemide (LASIX) 20 MG tablet Take 1 tablet (20 mg total) by mouth daily. 05/29/15  Yes Carmin Muskrat, MD  MYRBETRIQ 25 MG TB24 tablet Take 25 mg by mouth daily. Reported on 08/26/2015 03/03/15  Yes Historical Provider, MD  sitaGLIPtin (JANUVIA) 50 MG tablet Take 50 mg by mouth every evening.    Yes Historical Provider, MD  enoxaparin (LOVENOX) 40 MG/0.4ML injection Inject 0.4 mLs (40 mg total) into the skin every 12 (twelve) hours. 08/27/15   Kathrynn Ducking, MD  glucose blood (ONE TOUCH ULTRA TEST) test strip USE FOR TESTING SUGAR 2 to 3 TIMES DAILY E11.9 08/26/15   [name], MD   Physical Exam: Filed Vitals:   08/27/15 1700 08/27/15 1730 08/27/15 1800 08/27/15 1830  BP: 146/77  140/68 154/72 169/93  Pulse: 77 77 77 78  Temp:      TempSrc:      Resp: 22 15 18 20   Height:      Weight:      SpO2: 96% 98% 99% 99%    Wt Readings from Last 3 Encounters:  08/27/15 107.049 kg (236 lb)  08/26/15 108.41 kg (239 lb)  08/25/15 107.276 kg (236 lb 8 oz)    General:  Appears calm and comfortable. Is not toxic or septic. He is not febrile.  Eyes: PERRL, normal lids, irises & conjunctiva ENT: grossly normal hearing, lips & tongue Neck: no LAD, masses or thyromegaly Cardiovascular: RRR, no m/r/g.  his right leg is swollen and inflamed but not particular warm to the touch.  Telementary : SR, no arrhythmias  Respiratory: CTA bilaterally, no w/r/r. Normal respiratory effort. no pleural rub.  Abdomen: soft, ntnd Skin: no rash or induration seen on limited exam Musculoskeletal: grossly normal tone BUE/BLE Psychiatric: grossly normal mood and affect, speech fluent and appropriate Neurologic: grossly non-focal.          Labs on Admission:  Basic Metabolic Panel:  Recent Labs Lab 08/27/15 1542 08/27/15 1645  NA  --  139  K  --  4.3  CL  --  108  CO2  --  22  GLUCOSE  --  114*  BUN  --  27*  CREATININE 1.90* 1.82*  CALCIUM  --  9.2   Liver Function Tests: No results for input(s): AST, ALT, ALKPHOS, BILITOT, PROT, ALBUMIN in the last 168 hours. No results for input(s): LIPASE, AMYLASE in the last 168 hours. No results for input(s): AMMONIA in the last 168 hours. CBC:  Recent Labs Lab 08/27/15 1645  WBC 8.4  NEUTROABS 5.6  HGB 13.0  HCT 38.8*  MCV 86.0  PLT 170   Cardiac Enzymes:  Recent Labs Lab 08/27/15 1645  TROPONINI <0.03    BNP (last 3 results)  Recent Labs  05/29/15 1115  BNP 105.2*    ProBNP (last 3 results) No results for input(s): PROBNP in the last 8760 hours.  CBG: No results for input(s): GLUCAP in the last 168 hours.  Radiological Exams on Admission: Ct Angio Chest Pe W/cm &/or Wo Cm  08/27/2015  CLINICAL DATA:   Shortness of breath EXAM: CT ANGIOGRAPHY CHEST WITH CONTRAST TECHNIQUE: Multidetector CT imaging of the chest was performed using the standard protocol during bolus administration of intravenous contrast. Multiplanar CT image reconstructions and MIPs were obtained to evaluate the vascular anatomy. CONTRAST:  19mL OMNIPAQUE IOHEXOL 350 MG/ML SOLN COMPARISON:  None. FINDINGS: THORACIC INLET/BODY  WALL: No acute abnormality. MEDIASTINUM: There is extensive bilateral pulmonary embolism including saddle embolus. Bulky clot especially at the lobar level, occlusive to the right middle and lower lobes. Some of the clot appears acute and central, elsewhere there is a peripheral appearance. Borderline right heart strain with RV to LV ratio of 0.9. No acute aortic syndrome. Borderline cardiomegaly. No pericardial effusion. LUNG WINDOWS: No evidence of lung infarct or heart failure. Right lower lobe calcification. UPPER ABDOMEN: Steatotic appearance of the liver. OSSEOUS: No acute fracture.  No suspicious lytic or blastic lesions. Critical Value/emergent results were called by telephone at the time of interpretation on 08/27/2015 at 4:20 pm to Dr. Margette Fast , who verbally acknowledged these results. Dr. Jannifer Franklin requests transfer to the ER which has been completed. Review of the MIP images confirms the above findings. IMPRESSION: Positive for acute and subacute pulmonary embolism with borderline measurements for right heart strain (RV/LV Ratio =.9) which would indicate submassive (intermediate risk) PE and be associated with an increased risk of morbidity and mortality. Depending on clinical circumstances, consider activating Code PE by paging [phone]. Electronically Signed   By: Monte Fantasia M.D.   On: 08/27/2015 16:21   US Carotid Bilateral  08/27/2015  CLINICAL DATA:  Near syncope, chronic kidney disease, shortness of breath, diabetes. EXAM: BILATERAL CAROTID DUPLEX ULTRASOUND TECHNIQUE: Pearline Cables scale imaging, color  Doppler and duplex ultrasound was performed of bilateral carotid and vertebral arteries in the neck. COMPARISON:  None. REVIEW OF SYSTEMS: Quantification of carotid stenosis is based on velocity parameters that correlate the residual internal carotid diameter with NASCET-based stenosis levels, using the diameter of the distal internal carotid lumen as the denominator for stenosis measurement. The following velocity measurements were obtained: PEAK SYSTOLIC/END DIASTOLIC RIGHT ICA:                     73/21cm/sec CCA:                     [id number] SYSTOLIC ICA/CCA RATIO:  0.7 DIASTOLIC ICA/CCA RATIO: [id number] ECA:                     112cm/sec LEFT ICA:                     82/22cm/sec CCA:                     [id number] SYSTOLIC ICA/CCA RATIO:  [id number] DIASTOLIC ICA/CCA RATIO: [id number] ECA:                     90cm/sec FINDINGS: RIGHT CAROTID ARTERY: Mildly tortuous. Mild intimal thickening in the bulb. No focal plaque accumulation or stenosis. Normal waveforms and color Doppler signal. RIGHT VERTEBRAL ARTERY:  Normal flow direction and waveform. LEFT CAROTID ARTERY: Minimal plaque in the bulb. No significant stenosis. Normal waveforms and color Doppler signal. LEFT VERTEBRAL ARTERY: Normal flow direction and waveform. IMPRESSION: 1. Early bilateral carotid bifurcation plaque resulting in less than 50% diameter stenosis. The exam does not exclude plaque ulceration or embolization. Continued surveillance recommended. Electronically Signed   By: Lucrezia Europe M.D.   On: 08/27/2015 15:51   US Venous Img Lower Bilateral  08/27/2015  CLINICAL DATA:  Near syncope EXAM: BILATERAL LOWER EXTREMITY VENOUS DOPPLER ULTRASOUND TECHNIQUE: Gray-scale sonography with graded compression, as well as color Doppler and duplex ultrasound were performed to evaluate the lower extremity deep venous systems from the level of the  common femoral vein and including the common femoral, femoral, profunda femoral, popliteal and calf veins including the  posterior tibial, peroneal and gastrocnemius veins when visible. The superficial great saphenous vein was also interrogated. Spectral Doppler was utilized to evaluate flow at rest and with distal augmentation maneuvers in the common femoral, femoral and popliteal veins. COMPARISON:  None. FINDINGS: RIGHT LOWER EXTREMITY Mildly echogenic clot, predominantly occlusive throughout the femoral vein extending into the popliteal and upper calf veins. The clot appears recent with luminal expansion. Normal respiratory phasicity in the right common femoral vein. LEFT LOWER EXTREMITY Common Femoral Vein: No evidence of thrombus. Normal compressibility, respiratory phasicity and response to augmentation. Saphenofemoral Junction: No evidence of thrombus. Profunda Femoral Vein: No evidence of thrombus. Femoral Vein: No evidence of thrombus. Popliteal Vein: No evidence of thrombus. Calf Veins: No evidence of thrombus. Superficial Great Saphenous Vein: No evidence of thrombus. See chest CT report concerning communication. IMPRESSION: Extensive occlusive thrombus throughout the right femoral vein, popliteal vein, and upper calf veins. Electronically Signed   By: Monte Fantasia M.D.   On: 08/27/2015 16:07    EKG: Independently reviewed. SINUS RHYTHM WITHOUT ANY ACUTE ST-T WAVE CHANGES.   Assessment/Plan   1. Multiple pulmonary emboli. He will be anticoagulated with intravenous heparin. 2. Right leg DVT. Intravenous heparin. I will also obtain CT scan of the abdomen and pelvis to make sure there is no space-occupying lesion that is causing the DVT. He does have a history of melanoma in the past. 3. Chronic kidney disease. His creatinine appears to be at baseline. 4. Diabetes. Continue with home medications and sliding scale insulin. 5. Hypertension. Stable. Continue with home medications.   He'll be admitted to telemetry floor. Further recommendations will depend on patient's hospital progress.    Code Status: full  code.    DVT Prophylaxis: heparin.   Family Communication: I discussed the plan with the patient at the bedside.    Disposition Plan: home when medically stable.   Time spent: 60 minutes.   Doree Albee [facility] Pager [phone].

## 2015-08-27 NOTE — Telephone Encounter (Signed)
The patient has evidence of a left femoral vein DVT, and evidence of multiple pulmonary emboli, and pending right heart failure. I have recommended that the patient go through the emergency room for admission.

## 2015-08-27 NOTE — Progress Notes (Signed)
ANTICOAGULATION CONSULT NOTE - Initial Consult  Pharmacy Consult for Heparin Indication: pulmonary embolus and DVT  No Known Allergies  Patient Measurements: Height: 5\' 7"  (170.2 cm) Weight: 236 lb (107.049 kg) IBW/kg (Calculated) : 66.1 Heparin Dosing Weight: 90kg  Vital Signs: Temp: 97.6 F (36.4 C) (01/12 1617) Temp Source: Oral (01/12 1617) BP: 160/81 mmHg (01/12 1630) Pulse Rate: 83 (01/12 1630)  Labs:  Recent Labs  08/27/15 1542  CREATININE 1.90*    Estimated Creatinine Clearance: 39.8 mL/min (by C-G formula based on Cr of 1.9).   Medical History: Past Medical History  Diagnosis Date  . Hypertension   . Hyperlipidemia   . Adenomatous polyp of colon   . Nocturia   . Arthritis     bilateral knees  . History of DVT of lower extremity     2011--  LEFT LEG  . History of melanoma excision     RIGHT ARM  . History of basal cell cancer   . History of kidney stones   . Prediabetes     DIET CONTROLLED  . Ulcer of left lower leg ([place])     recent pcp visit dr Laurance Flatten--  ordered betadine wet/dry dsg daily (03-18-2013)  [place] 1 WEEK  . CKD (chronic kidney disease), stage II   . Diabetes mellitus without complication ([place])   . Near syncope 08/25/2015  . Prostate cancer ([place]) 2014  . Melanoma ([place]) 1990    Medications:  See med rec  Assessment: 75 yo male presents to ED with SOB and right leg pain. Venous dopplers show extensive occlusive thrombus throughtout the right femoral vein, popliteals, and upper calf veins. CT angiogram shows pulmonary embolus. Patient has h/o DVT. Start Heparin infusion for treatment.  Goal of Therapy:  Heparin level 0.3-0.7 units/ml Monitor platelets by anticoagulation protocol: Yes   Plan:  Give 4000 units bolus x 1 Start heparin infusion at 1400 units/hr Check anti-Xa level in 8 hours and daily while on heparin Continue to monitor H&H and platelets  Trenton Gammon, Donivan Thammavong L 08/27/2015,4:55 PM

## 2015-08-27 NOTE — ED Notes (Signed)
Pt states he had an US showing DVT to right leg today. States SOB  X 2 mo, intermittently.

## 2015-08-27 NOTE — Telephone Encounter (Signed)
The patient has had a venous Doppler study, it shows a large DVT involving the left lower extremity. The patient will be started on Lovenox, I have contacted the office of Dr. Laurance Flatten to make them aware, they will see the patient back next week.

## 2015-08-28 ENCOUNTER — Telehealth: Payer: Self-pay | Admitting: Family Medicine

## 2015-08-28 ENCOUNTER — Encounter (HOSPITAL_COMMUNITY): Payer: Self-pay | Admitting: *Deleted

## 2015-08-28 DIAGNOSIS — I2699 Other pulmonary embolism without acute cor pulmonale: Principal | ICD-10-CM

## 2015-08-28 DIAGNOSIS — E669 Obesity, unspecified: Secondary | ICD-10-CM

## 2015-08-28 DIAGNOSIS — N183 Chronic kidney disease, stage 3 (moderate): Secondary | ICD-10-CM

## 2015-08-28 DIAGNOSIS — I10 Essential (primary) hypertension: Secondary | ICD-10-CM

## 2015-08-28 LAB — CBC
HCT: 36.9 % — ABNORMAL LOW (ref 39.0–52.0)
HEMOGLOBIN: 12.3 g/dL — AB (ref 13.0–17.0)
MCH: 28.6 pg (ref 26.0–34.0)
MCHC: 33.3 g/dL (ref 30.0–36.0)
MCV: 85.8 fL (ref 78.0–100.0)
Platelets: 153 10*3/uL (ref 150–400)
RBC: 4.3 MIL/uL (ref 4.22–5.81)
RDW: 14.3 % (ref 11.5–15.5)
WBC: 7.4 10*3/uL (ref 4.0–10.5)

## 2015-08-28 LAB — GLUCOSE, CAPILLARY
GLUCOSE-CAPILLARY: 115 mg/dL — AB (ref 65–99)
Glucose-Capillary: 99 mg/dL (ref 65–99)

## 2015-08-28 LAB — HEPARIN LEVEL (UNFRACTIONATED)
Heparin Unfractionated: 0.14 IU/mL — ABNORMAL LOW (ref 0.30–0.70)
Heparin Unfractionated: 0.24 [IU]/mL — ABNORMAL LOW (ref 0.30–0.70)

## 2015-08-28 LAB — COMPREHENSIVE METABOLIC PANEL
ALK PHOS: 41 U/L (ref 38–126)
ALT: 12 U/L — AB (ref 17–63)
AST: 18 U/L (ref 15–41)
Albumin: 3.7 g/dL (ref 3.5–5.0)
Anion gap: 7 (ref 5–15)
BUN: 27 mg/dL — ABNORMAL HIGH (ref 6–20)
CALCIUM: 8.9 mg/dL (ref 8.9–10.3)
CO2: 23 mmol/L (ref 22–32)
CREATININE: 1.88 mg/dL — AB (ref 0.61–1.24)
Chloride: 107 mmol/L (ref 101–111)
GFR, EST AFRICAN AMERICAN: 39 mL/min — AB (ref >60)
GFR, EST NON AFRICAN AMERICAN: 34 mL/min — AB (ref >60)
Glucose, Bld: 113 mg/dL — ABNORMAL HIGH (ref 65–99)
Potassium: 4.2 mmol/L (ref 3.5–5.1)
Sodium: 137 mmol/L (ref 135–145)
Total Bilirubin: 0.9 mg/dL (ref 0.3–1.2)
Total Protein: 6.1 g/dL — ABNORMAL LOW (ref 6.5–8.1)

## 2015-08-28 MED ORDER — RIVAROXABAN 20 MG PO TABS: 20.0000 mg | ORAL_TABLET | Freq: Every day | ORAL | Status: DC

## 2015-08-28 MED ORDER — HEPARIN (PORCINE) IN NACL 100-0.45 UNIT/ML-% IJ SOLN
1850.0000 [IU]/h | INTRAMUSCULAR | Status: AC
  Filled 2015-08-28: qty 250

## 2015-08-28 MED ORDER — HEPARIN (PORCINE) IN NACL 100-0.45 UNIT/ML-% IJ SOLN: 1600.0000 [IU]/h | INTRAMUSCULAR | Status: DC

## 2015-08-28 MED ORDER — RIVAROXABAN 15 MG PO TABS
15.0000 mg | ORAL_TABLET | Freq: Two times a day (BID) | ORAL | Status: DC
  Administered 2015-08-28 – 2015-08-29 (×2): 15 mg via ORAL
  Filled 2015-08-28 (×2): qty 1

## 2015-08-28 NOTE — Progress Notes (Signed)
PROGRESS NOTE  Ricardo Mayer [id number] DOB: 09-24-1940 DOA: 08/27/2015 PCP: [name], MD  HPI/Recap of past 1 hours: 75 year old male with past medical history of previous DVT, obesity and prostate cancer has been undergoing a workup for dyspnea on exertion and near syncopal events for the past 2 months. Initial cardiac workup negative. Seen by neurology and noted that his legs were swollen so he underwent Doppler for DVT. Patient found have right leg DVT and follow-up CT angiogram noted multiple pulmonary emboli so patient sent to emergency room. Patient started on heparin and admitted to hospitalist service  Today, patient doing okay. Feeling a little bit better. At rest, no complaints. No leg pain.  Assessment/Plan: Active Problems:   Prostate cancer ([place])   Melanoma, history of   Hypertension: Blood pressure stable   Chronic kidney disease, stage III (moderate): Stable. After extensive discussion, feel it would be best the patient is on xarelto, he will need to be on reduced dose as per pharmacy given renal dysfunction   Controlled type 2 diabetes mellitus ([place]): Stable   Pulmonary emboli ([place]): Have started xarelto. Given dyspnea on exertion, ambulated patient on room air and while sets were borderline low at 90%, not enough for home oxygen. Observe overnight and if stable with no issues, discharged home tomorrow on xarelto  Obesity: Patient meets criteria with BMI greater than 30   Code Status: Full code  Family Communication: Wife at the bedside   Disposition Plan: Home tomorrow    Consultants:  None   Procedures:  None   Antibiotics:  None    Objective: BP 128/64 mmHg  Pulse 83  Temp(Src) 98.7 F (37.1 C) (Oral)  Resp 18  Ht 5\' 7"  (1.702 m)  Wt 105.189 kg (231 lb 14.4 oz)  BMI 36.31 kg/m2  SpO2 98%  Intake/Output Summary (Last 24 hours) at 08/28/15 1800 Last data filed at 08/28/15 1700  Gross per 24 hour  Intake    980 ml  Output    950 ml   Net     30 ml   Filed Weights   08/27/15 1617 08/27/15 1935  Weight: 107.049 kg (236 lb) 105.189 kg (231 lb 14.4 oz)    Exam:   General:  Alert and oriented 3   Cardiovascular: Regular rate and rhythm, S1-S2   Respiratory: Clear to auscultation bilaterally   Abdomen:  soft, obese, nontender, positive bowel sounds   musculoskeletal: No clubbing or cyanosis, trace edema    Data Reviewed: Basic Metabolic Panel:  Recent Labs Lab 08/27/15 1542 08/27/15 1645 08/28/15 0148  NA  --  139 137  K  --  4.3 4.2  CL  --  108 107  CO2  --  22 23  GLUCOSE  --  114* 113*  BUN  --  27* 27*  CREATININE 1.90* 1.82* 1.88*  CALCIUM  --  9.2 8.9   Liver Function Tests:  Recent Labs Lab 08/28/15 0148  AST 18  ALT 12*  ALKPHOS 41  BILITOT 0.9  PROT 6.1*  ALBUMIN 3.7   No results for input(s): LIPASE, AMYLASE in the last 168 hours. No results for input(s): AMMONIA in the last 168 hours. CBC:  Recent Labs Lab 08/27/15 1645 08/28/15 0148  WBC 8.4 7.4  NEUTROABS 5.6  --   HGB 13.0 12.3*  HCT 38.8* 36.9*  MCV 86.0 85.8  PLT 170 153   Cardiac Enzymes:    Recent Labs Lab 08/27/15 1645  TROPONINI <0.03  BNP (last 3 results)  Recent Labs  05/29/15 1115  BNP 105.2*    ProBNP (last 3 results) No results for input(s): PROBNP in the last 8760 hours.  CBG:  Recent Labs Lab 08/27/15 2140 08/28/15 0810 08/28/15 1132  GLUCAP 147* 115* 99    No results found for this or any previous visit (from the past 240 hour(s)).   Studies: Ct Abdomen Pelvis Wo Contrast  08/27/2015  CLINICAL DATA:  Extensive DVT, positive PE with right heart strain, chronic renal disease EXAM: CT ABDOMEN AND PELVIS WITHOUT CONTRAST TECHNIQUE: Multidetector CT imaging of the abdomen and pelvis was performed following the standard protocol without IV contrast. COMPARISON:  07/07/2009 FINDINGS: Lung bases are unremarkable. There is mild fatty infiltration of the liver. No intrahepatic biliary  ductal dilatation. Sagittal images of the spine shows osteopenia and mild degenerative changes thoracolumbar spine. No calcified gallstones are noted within gallbladder. Unenhanced pancreas, spleen and adrenal glands are unremarkable. Unenhanced kidneys are symmetrical in size. Excreted contrast material from recent CT scan of the chest is noted within bilateral renal collecting system and bilateral proximal ureter. There is no hydronephrosis or hydroureter. Bilateral distal ureter is unremarkable. Contrast material is noted within urinary bladder. No aortic aneurysm. Mild atherosclerotic calcifications of abdominal aorta. No small bowel obstruction.  No ascites or free air.  No adenopathy. There is no small bowel obstruction. Normal appendix. No pericecal inflammation. The terminal ileum is unremarkable. Radiation seeds are noted in prostate gland region. Borderline bilateral inguinal lymph nodes are stable in size in appearance from prior exam. No pelvic ascites or adenopathy. No destructive bony lesions are noted within pelvis. Degenerative changes bilateral SI joints. IMPRESSION: 1. Mild fatty infiltration of the liver is noted. No intrahepatic biliary ductal dilatation. 2. Normal excreted contrast material is noted in bilateral renal collecting system and bilateral visualized ureter without evidence of hydronephrosis or hydroureter. Contrast material is noted within urinary bladder. There are no bladder filling defects. 3. Radiation seeds are noted in prostate gland region. 4. Normal appendix.  No pericecal inflammation. 5. No small bowel obstruction.  No ascites or free air. 6. Stable borderline bilateral inguinal lymph nodes. Electronically Signed   By: Lahoma Crocker M.D.   On: 08/27/2015 20:23    Scheduled Meds: . amLODipine  10 mg Oral Daily   And  . irbesartan  300 mg Oral Daily  . aspirin  81 mg Oral Daily  . fenofibrate  160 mg Oral Daily  . furosemide  20 mg Oral Daily  . insulin aspart  0-5  Units Subcutaneous QHS  . insulin aspart  0-9 Units Subcutaneous TID WC  . linagliptin  5 mg Oral Daily  . mirabegron ER  25 mg Oral Daily  . Rivaroxaban  15 mg Oral BID WC  . [START ON 09/18/2015] rivaroxaban  20 mg Oral Q supper  . sodium chloride  3 mL Intravenous Q12H    Continuous Infusions: . sodium chloride 50 mL/hr at 08/27/15 1900     Time spent: 25 minutes   [facility] Pager [phone] . If 7PM-7AM, please contact night-coverage at [email or web address], password [facility] 08/28/2015, 6:00 PM  LOS: 1 day

## 2015-08-28 NOTE — Care Management Important Message (Signed)
Important Message  Patient Details  Name: Ricardo Mayer MRN: [id number] Date of Birth: [birth year]   Medicare Important Message Given:  Yes    Alvie Heidelberg, RN 08/28/2015, 10:10 AM

## 2015-08-28 NOTE — Progress Notes (Signed)
ANTICOAGULATION CONSULT NOTE - follow up  Pharmacy Consult for Xarelto Indication: pulmonary embolus and DVT  No Known Allergies  Patient Measurements: Height: 5\' 7"  (170.2 cm) Weight: 231 lb 14.4 oz (105.189 kg) IBW/kg (Calculated) : 66.1  Vital Signs: Temp: 98.4 F (36.9 C) (01/13 0531) Temp Source: Oral (01/13 0531) BP: 145/55 mmHg (01/13 0531) Pulse Rate: 74 (01/13 0531)  Labs:  Recent Labs  08/27/15 1542 08/27/15 1645 08/28/15 0148 08/28/15 0950  HGB  --  13.0 12.3*  --   HCT  --  38.8* 36.9*  --   PLT  --  170 153  --   LABPROT  --  14.6  --   --   INR  --  1.12  --   --   HEPARINUNFRC  --   --  0.14* 0.24*  CREATININE 1.90* 1.82* 1.88*  --   TROPONINI  --  <0.03  --   --     Estimated Creatinine Clearance: 39.8 mL/min (by C-G formula based on Cr of 1.88).  Medical History: Past Medical History  Diagnosis Date  . Hypertension   . Hyperlipidemia   . Adenomatous polyp of colon   . Nocturia   . Arthritis     bilateral knees  . History of DVT of lower extremity     2011--  LEFT LEG  . History of melanoma excision     RIGHT ARM  . History of basal cell cancer   . History of kidney stones   . Prediabetes     DIET CONTROLLED  . Ulcer of left lower leg ([place])     recent pcp visit dr Laurance Flatten--  ordered betadine wet/dry dsg daily (03-18-2013)  [place] 1 WEEK  . CKD (chronic kidney disease), stage II   . Diabetes mellitus without complication ([place])   . Near syncope 08/25/2015  . Prostate cancer ([place]) 2014  . Melanoma ([place]) 1990   Medications:  See med rec  Assessment: 75 yo male presents to ED with SOB and right leg pain. Venous dopplers show extensive occlusive thrombus throughtout the right femoral vein, popliteals, and upper calf veins. CT angiogram shows pulmonary embolus. Patient has h/o DVT. Was initially started on Heparin infusion for treatment but now switching to Xarelto  Goal of Therapy:  Full dose Xarelto for  anticoagulation Monitor platelets by anticoagulation protocol: Yes   Plan: Xarelto 15mg  PO BID starting today w/ supper D/C Heparin infusion when first dose of Xarelto given * Provide Xarelto education Monitor for s/sx of bleeding problems  Hart Robinsons A 08/28/2015,12:12 PM

## 2015-08-28 NOTE — Discharge Instructions (Addendum)
Information on my medicine - XARELTO (rivaroxaban)  This medication education was reviewed with me or my healthcare representative as part of my discharge preparation.  The pharmacist that spoke with me during my hospital stay was:  Ena Dawley, [place]? Xarelto was prescribed to treat blood clots that may have been found in the veins of your legs (deep vein thrombosis) or in your lungs (pulmonary embolism) and to reduce the risk of them occurring again.  What do you need to know about Xarelto? The starting dose is one 15 mg tablet taken TWICE daily with food for the FIRST 21 DAYS then on (enter date)  09/18/15  the dose is changed to one 20 mg tablet taken ONCE A DAY with your evening meal.  DO NOT stop taking Xarelto without talking to the health care provider who prescribed the medication.  Refill your prescription for 20 mg tablets before you run out.  After discharge, you should have regular check-up appointments with your healthcare provider that is prescribing your Xarelto.  In the future your dose may need to be changed if your kidney function changes by a significant amount.  What do you do if you miss a dose? If you are taking Xarelto TWICE DAILY and you miss a dose, take it as soon as you remember. You may take two 15 mg tablets (total 30 mg) at the same time then resume your regularly scheduled 15 mg twice daily the next day.  If you are taking Xarelto ONCE DAILY and you miss a dose, take it as soon as you remember on the same day then continue your regularly scheduled once daily regimen the next day. Do not take two doses of Xarelto at the same time.   Important Safety Information Xarelto is a blood thinner medicine that can cause bleeding. You should call your healthcare provider right away if you experience any of the following: ? Bleeding from an injury or your nose that does not stop. ? Unusual colored urine (red or dark brown) or  unusual colored stools (red or black). ? Unusual bruising for unknown reasons. ? A serious fall or if you hit your head (even if there is no bleeding).  Some medicines may interact with Xarelto and might increase your risk of bleeding while on Xarelto. To help avoid this, consult your healthcare provider or pharmacist prior to using any new prescription or non-prescription medications, including herbals, vitamins, non-steroidal anti-inflammatory drugs (NSAIDs) and supplements.  This website has more information on Xarelto: [email or web address]

## 2015-08-28 NOTE — Progress Notes (Signed)
Heparin level called to pharmarcist.  Pharmacist ordered to increase level to 16 units/hr.  Heparin level increased. Will continue to monitor patient.

## 2015-08-28 NOTE — Progress Notes (Signed)
ANTICOAGULATION CONSULT NOTE - follow up  Pharmacy Consult for Heparin Indication: pulmonary embolus and DVT  No Known Allergies  Patient Measurements: Height: 5\' 7"  (170.2 cm) Weight: 231 lb 14.4 oz (105.189 kg) IBW/kg (Calculated) : 66.1  Vital Signs: Temp: 98.4 F (36.9 C) (01/13 0531) Temp Source: Oral (01/13 0531) BP: 145/55 mmHg (01/13 0531) Pulse Rate: 74 (01/13 0531)  Labs:  Recent Labs  08/27/15 1542 08/27/15 1645 08/28/15 0148 08/28/15 0950  HGB  --  13.0 12.3*  --   HCT  --  38.8* 36.9*  --   PLT  --  170 153  --   LABPROT  --  14.6  --   --   INR  --  1.12  --   --   HEPARINUNFRC  --   --  0.14* 0.24*  CREATININE 1.90* 1.82* 1.88*  --   TROPONINI  --  <0.03  --   --     Estimated Creatinine Clearance: 39.8 mL/min (by C-G formula based on Cr of 1.88).   Medical History: Past Medical History  Diagnosis Date  . Hypertension   . Hyperlipidemia   . Adenomatous polyp of colon   . Nocturia   . Arthritis     bilateral knees  . History of DVT of lower extremity     2011--  LEFT LEG  . History of melanoma excision     RIGHT ARM  . History of basal cell cancer   . History of kidney stones   . Prediabetes     DIET CONTROLLED  . Ulcer of left lower leg ([place])     recent pcp visit dr Laurance Flatten--  ordered betadine wet/dry dsg daily (03-18-2013)  [place] 1 WEEK  . CKD (chronic kidney disease), stage II   . Diabetes mellitus without complication ([place])   . Near syncope 08/25/2015  . Prostate cancer ([place]) 2014  . Melanoma ([place]) 1990    Medications:  See med rec  Assessment: 75 yo male presents to ED with SOB and right leg pain. Venous dopplers show extensive occlusive thrombus throughtout the right femoral vein, popliteals, and upper calf veins. CT angiogram shows pulmonary embolus. Patient has h/o DVT. Start Heparin infusion for treatment.  Heparin level is below goal.  Goal of Therapy:  Heparin level 0.3-0.7 units/ml Monitor  platelets by anticoagulation protocol: Yes   Plan: Increase Heparin to 1850 units/hr now Recheck heparin level in 8 hrs Heparin level and CBC daily while on Heparin Monitor for s/sx of bleeding problems  Hart Robinsons A 08/28/2015,10:56 AM

## 2015-08-28 NOTE — Telephone Encounter (Signed)
Noted - we also spoke with Dr Jannifer Franklin

## 2015-08-28 NOTE — Progress Notes (Signed)
Patient's O2 sats at rest 96% on RA. Patient ambulated on unit on RA and sats 89-93%. Patient returned to room and bed with O2 sats at rest 93%. Patient denies shortness of breath or pain.  Patient tolerated well.

## 2015-08-29 DIAGNOSIS — E1122 Type 2 diabetes mellitus with diabetic chronic kidney disease: Secondary | ICD-10-CM

## 2015-08-29 LAB — CBC
HEMATOCRIT: 38.6 % — AB (ref 39.0–52.0)
Hemoglobin: 12.9 g/dL — ABNORMAL LOW (ref 13.0–17.0)
MCH: 28.5 pg (ref 26.0–34.0)
MCHC: 33.4 g/dL (ref 30.0–36.0)
MCV: 85.4 fL (ref 78.0–100.0)
Platelets: 163 10*3/uL (ref 150–400)
RBC: 4.52 MIL/uL (ref 4.22–5.81)
RDW: 14.1 % (ref 11.5–15.5)
WBC: 6.1 10*3/uL (ref 4.0–10.5)

## 2015-08-29 MED ORDER — RIVAROXABAN (XARELTO) VTE STARTER PACK (15 & 20 MG): ORAL_TABLET | ORAL | Status: DC

## 2015-08-29 NOTE — Progress Notes (Signed)
AVS reviewed with patient and patient's wife.  Verbalized understanding of discharge instructions, physician follow-up, and medications.  Prescription for Xarelto given to patient.  Patient's two IVs removed.  Sites WNL.  Patient reports belongings intact and in possession at time of discharge.  Patient transported by RN via wheelchair to main entrance for discharge.  Patient stable at time of discharge.

## 2015-08-29 NOTE — Progress Notes (Signed)
ANTICOAGULATION CONSULT NOTE - follow up  Pharmacy Consult for Xarelto Indication: pulmonary embolus and DVT  No Known Allergies  Patient Measurements: Height: 5\' 7"  (170.2 cm) Weight: 231 lb 14.4 oz (105.189 kg) IBW/kg (Calculated) : 66.1  Vital Signs: Temp: 99 F (37.2 C) (01/14 0621) Temp Source: Oral (01/14 0621) BP: 126/64 mmHg (01/14 0621) Pulse Rate: 80 (01/14 0621)  Labs:  Recent Labs  08/27/15 1542  08/27/15 1645 08/28/15 0148 08/28/15 0950 08/29/15 0634  HGB  --   < > 13.0 12.3*  --  12.9*  HCT  --   --  38.8* 36.9*  --  38.6*  PLT  --   --  170 153  --  163  LABPROT  --   --  14.6  --   --   --   INR  --   --  1.12  --   --   --   HEPARINUNFRC  --   --   --  0.14* 0.24*  --   CREATININE 1.90*  --  1.82* 1.88*  --   --   TROPONINI  --   --  <0.03  --   --   --   < > = values in this interval not displayed.  Estimated Creatinine Clearance: 39.8 mL/min (by C-G formula based on Cr of 1.88).  Medical History: Past Medical History  Diagnosis Date  . Hypertension   . Hyperlipidemia   . Adenomatous polyp of colon   . Nocturia   . Arthritis     bilateral knees  . History of DVT of lower extremity     2011--  LEFT LEG  . History of melanoma excision     RIGHT ARM  . History of basal cell cancer   . History of kidney stones   . Prediabetes     DIET CONTROLLED  . Ulcer of left lower leg ([place])     recent pcp visit dr Laurance Flatten--  ordered betadine wet/dry dsg daily (03-18-2013)  [place] 1 WEEK  . CKD (chronic kidney disease), stage II   . Diabetes mellitus without complication ([place])   . Near syncope 08/25/2015  . Prostate cancer ([place]) 2014  . Melanoma ([place]) 1990   Assessment: 75 yo male presents to ED with SOB and right leg pain. Venous dopplers show extensive occlusive thrombus throughtout the right femoral vein, popliteals, and upper calf veins. CT angiogram shows pulmonary embolus. Patient has h/o DVT. Was initially started on  Heparin infusion for treatment but now switching to Xarelto.  ClCr > 30  Goal of Therapy:  Full dose Xarelto for anticoagulation Monitor platelets by anticoagulation protocol: Yes   Plan: Xarelto 15mg  PO BID x 3 weeks then 20mg  PO daily w/ supper (for clcr > 30) Xarelto education done and pt given discount card / discharge packet Monitor for s/sx of bleeding problems  Hart Robinsons A 08/29/2015,10:08 AM

## 2015-08-29 NOTE — Discharge Summary (Signed)
Discharge Summary  Ricardo Mayer [id number] DOB: 29-Jul-1941  PCP: [name], MD  Admit date: 08/27/2015 Discharge date: 08/29/2015  Time spent: 25 minutes   Recommendations for Outpatient Follow-up:  1. New medication: Xarelto 15 mg by mouth twice a day and then after 21 days, 20 mg by mouth daily  2. For the next 6 months while on Xarelto, patient not to be on aspirin, Aleve or any NSAIDs 3. Patient will follow-up with PCP in the next 2-4 weeks  Discharge Diagnoses:  Active Hospital Problems   Diagnosis Date Noted  . Pulmonary emboli ([place]) 08/27/2015  . Controlled type 2 diabetes mellitus ([place]) 06/02/2015  . Chronic kidney disease, stage III (moderate) 10/09/2013  . Hypertension 10/09/2013  . Melanoma, history of 10/09/2013  . Prostate cancer ([place]) 12/25/2012    Resolved Hospital Problems   Diagnosis Date Noted Date Resolved  No resolved problems to display.    Discharge Condition: Improved, being discharged home   Diet recommendation: Carb modified low sodium   Filed Weights   08/27/15 1617 08/27/15 1935  Weight: 107.049 kg (236 lb) 105.189 kg (231 lb 14.4 oz)    History of present illness:  75 year old male with past medical history of previous DVT, obesity and prostate cancer has been undergoing a workup for dyspnea on exertion and near syncopal events for the past 2 months. Initial cardiac workup negative. Seen by neurology and noted that his legs were swollen so he underwent Doppler for DVT. Patient found have right leg DVT and follow-up CT angiogram noted multiple pulmonary emboli so patient sent to emergency room. Patient started on heparin and admitted to hospitalist service   Hospital Course:  Active Problems:   Prostate cancer ([place])   Melanoma, history of   Hypertension: Blood pressure stable during hospitalization.    Chronic kidney disease, stage III (moderate): Stable. Xarelto renally adjusted.    Controlled type 2 diabetes mellitus ([place]): CBG  stable during hospitalization.  Obesity: Patient meets criteria with BMI greater than 30.    Pulmonary emboli [facility]): Principal issue. Patient started on Xarelto which she tolerated well. Given his dyspnea on exertion and near syncope, we ambulated patient on room air and WERE borderline low around 90%, not enough to qualify for home oxygen. Patient observed overnight and no issues was felt to be stable and discharged home on Xarelto. We will need to continue on this for at least 6 months   Procedures:  None   Consultations:  None   Discharge Exam: BP 126/64 mmHg  Pulse 80  Temp(Src) 99 F (37.2 C) (Oral)  Resp 20  Ht 5\' 7"  (1.702 m)  Wt 105.189 kg (231 lb 14.4 oz)  BMI 36.31 kg/m2  SpO2 98%  General: Alert and oriented 3, no acute distress  Cardiovascular: Regular rate and rhythm, S1-S2  Respiratory: Clear to auscultation bilaterally   Discharge Instructions You were cared for by a hospitalist during your hospital stay. If you have any questions about your discharge medications or the care you received while you were in the hospital after you are discharged, you can call the unit and asked to speak with the hospitalist on call if the hospitalist that took care of you is not available. Once you are discharged, your primary care physician will handle any further medical issues. Please note that NO REFILLS for any discharge medications will be authorized once you are discharged, as it is imperative that you return to your primary care physician (or establish a relationship with  a primary care physician if you do not have one) for your aftercare needs so that they can reassess your need for medications and monitor your lab values.  Discharge Instructions    Consult to care management    Complete by:  As directed   Patient needs drug card for Xarelto     Diet - low sodium heart healthy    Complete by:  As directed      Increase activity slowly    Complete by:  As directed               Medication List    STOP taking these medications        aspirin 81 MG tablet     enoxaparin 40 MG/0.4ML injection  Commonly known as:  LOVENOX      TAKE these medications        acetaminophen 500 MG tablet  Commonly known as:  TYLENOL  Take 500 mg by mouth every 6 (six) hours as needed for pain.     amLODipine-valsartan 10-320 MG tablet  Commonly known as:  EXFORGE  TAKE 1 TABLET BY MOUTH DAILY.     fenofibrate 160 MG tablet  TAKE 1 TABLET BY MOUTH DAILY     furosemide 20 MG tablet  Commonly known as:  LASIX  Take 1 tablet (20 mg total) by mouth daily.     glucose blood test strip  Commonly known as:  ONE TOUCH ULTRA TEST  USE FOR TESTING SUGAR 2 to 3 TIMES DAILY E11.9     MYRBETRIQ 25 MG Tb24 tablet  Generic drug:  mirabegron ER  Take 25 mg by mouth daily. Reported on 08/26/2015     Rivaroxaban 15 & 20 MG Tbpk  Commonly known as:  XARELTO STARTER PACK  Take as directed on package: Start with one 15mg  tablet by mouth twice a day with food. On Day 22, switch to one 20mg  tablet once a day with food.     sitaGLIPtin 50 MG tablet  Commonly known as:  JANUVIA  Take 50 mg by mouth every evening.       No Known Allergies     Follow-up Information    Follow up with [name], MD On 09/29/2015.   Specialty:  Family Medicine   Why:  at 3:00 pm   Contact information:   [place] [address] [phone]        The results of significant diagnostics from this hospitalization (including imaging, microbiology, ancillary and laboratory) are listed below for reference.    Significant Diagnostic Studies: Ct Abdomen Pelvis Wo Contrast  08/27/2015  CLINICAL DATA:  Extensive DVT, positive PE with right heart strain, chronic renal disease EXAM: CT ABDOMEN AND PELVIS WITHOUT CONTRAST TECHNIQUE: Multidetector CT imaging of the abdomen and pelvis was performed following the standard protocol without IV contrast. COMPARISON:  07/07/2009 FINDINGS:  Lung bases are unremarkable. There is mild fatty infiltration of the liver. No intrahepatic biliary ductal dilatation. Sagittal images of the spine shows osteopenia and mild degenerative changes thoracolumbar spine. No calcified gallstones are noted within gallbladder. Unenhanced pancreas, spleen and adrenal glands are unremarkable. Unenhanced kidneys are symmetrical in size. Excreted contrast material from recent CT scan of the chest is noted within bilateral renal collecting system and bilateral proximal ureter. There is no hydronephrosis or hydroureter. Bilateral distal ureter is unremarkable. Contrast material is noted within urinary bladder. No aortic aneurysm. Mild atherosclerotic calcifications of abdominal aorta. No small bowel obstruction.  No ascites or free air.  No adenopathy. There is no small bowel obstruction. Normal appendix. No pericecal inflammation. The terminal ileum is unremarkable. Radiation seeds are noted in prostate gland region. Borderline bilateral inguinal lymph nodes are stable in size in appearance from prior exam. No pelvic ascites or adenopathy. No destructive bony lesions are noted within pelvis. Degenerative changes bilateral SI joints. IMPRESSION: 1. Mild fatty infiltration of the liver is noted. No intrahepatic biliary ductal dilatation. 2. Normal excreted contrast material is noted in bilateral renal collecting system and bilateral visualized ureter without evidence of hydronephrosis or hydroureter. Contrast material is noted within urinary bladder. There are no bladder filling defects. 3. Radiation seeds are noted in prostate gland region. 4. Normal appendix.  No pericecal inflammation. 5. No small bowel obstruction.  No ascites or free air. 6. Stable borderline bilateral inguinal lymph nodes. Electronically Signed   By: Lahoma Crocker M.D.   On: 08/27/2015 20:23   Ct Angio Chest Pe W/cm &/or Wo Cm  08/27/2015  CLINICAL DATA:  Shortness of breath EXAM: CT ANGIOGRAPHY CHEST WITH  CONTRAST TECHNIQUE: Multidetector CT imaging of the chest was performed using the standard protocol during bolus administration of intravenous contrast. Multiplanar CT image reconstructions and MIPs were obtained to evaluate the vascular anatomy. CONTRAST:  62mL OMNIPAQUE IOHEXOL 350 MG/ML SOLN COMPARISON:  None. FINDINGS: THORACIC INLET/BODY WALL: No acute abnormality. MEDIASTINUM: There is extensive bilateral pulmonary embolism including saddle embolus. Bulky clot especially at the lobar level, occlusive to the right middle and lower lobes. Some of the clot appears acute and central, elsewhere there is a peripheral appearance. Borderline right heart strain with RV to LV ratio of 0.9. No acute aortic syndrome. Borderline cardiomegaly. No pericardial effusion. LUNG WINDOWS: No evidence of lung infarct or heart failure. Right lower lobe calcification. UPPER ABDOMEN: Steatotic appearance of the liver. OSSEOUS: No acute fracture.  No suspicious lytic or blastic lesions. Critical Value/emergent results were called by telephone at the time of interpretation on 08/27/2015 at 4:20 pm to Dr. Margette Fast , who verbally acknowledged these results. Dr. Jannifer Franklin requests transfer to the ER which has been completed. Review of the MIP images confirms the above findings. IMPRESSION: Positive for acute and subacute pulmonary embolism with borderline measurements for right heart strain (RV/LV Ratio =.9) which would indicate submassive (intermediate risk) PE and be associated with an increased risk of morbidity and mortality. Depending on clinical circumstances, consider activating Code PE by paging [phone]. Electronically Signed   By: Monte Fantasia M.D.   On: 08/27/2015 16:21   US Carotid Bilateral  08/27/2015  CLINICAL DATA:  Near syncope, chronic kidney disease, shortness of breath, diabetes. EXAM: BILATERAL CAROTID DUPLEX ULTRASOUND TECHNIQUE: Pearline Cables scale imaging, color Doppler and duplex ultrasound was performed of  bilateral carotid and vertebral arteries in the neck. COMPARISON:  None. REVIEW OF SYSTEMS: Quantification of carotid stenosis is based on velocity parameters that correlate the residual internal carotid diameter with NASCET-based stenosis levels, using the diameter of the distal internal carotid lumen as the denominator for stenosis measurement. The following velocity measurements were obtained: PEAK SYSTOLIC/END DIASTOLIC RIGHT ICA:                     73/21cm/sec CCA:                     [id number] SYSTOLIC ICA/CCA RATIO:  0.7 DIASTOLIC ICA/CCA RATIO: [id number] ECA:  112cm/sec LEFT ICA:                     82/22cm/sec CCA:                     [id number] SYSTOLIC ICA/CCA RATIO:  [id number] DIASTOLIC ICA/CCA RATIO: [id number] ECA:                     90cm/sec FINDINGS: RIGHT CAROTID ARTERY: Mildly tortuous. Mild intimal thickening in the bulb. No focal plaque accumulation or stenosis. Normal waveforms and color Doppler signal. RIGHT VERTEBRAL ARTERY:  Normal flow direction and waveform. LEFT CAROTID ARTERY: Minimal plaque in the bulb. No significant stenosis. Normal waveforms and color Doppler signal. LEFT VERTEBRAL ARTERY: Normal flow direction and waveform. IMPRESSION: 1. Early bilateral carotid bifurcation plaque resulting in less than 50% diameter stenosis. The exam does not exclude plaque ulceration or embolization. Continued surveillance recommended. Electronically Signed   By: Lucrezia Europe M.D.   On: 08/27/2015 15:51   US Venous Img Lower Bilateral  08/27/2015  CLINICAL DATA:  Near syncope EXAM: BILATERAL LOWER EXTREMITY VENOUS DOPPLER ULTRASOUND TECHNIQUE: Gray-scale sonography with graded compression, as well as color Doppler and duplex ultrasound were performed to evaluate the lower extremity deep venous systems from the level of the common femoral vein and including the common femoral, femoral, profunda femoral, popliteal and calf veins including the posterior tibial, peroneal and gastrocnemius  veins when visible. The superficial great saphenous vein was also interrogated. Spectral Doppler was utilized to evaluate flow at rest and with distal augmentation maneuvers in the common femoral, femoral and popliteal veins. COMPARISON:  None. FINDINGS: RIGHT LOWER EXTREMITY Mildly echogenic clot, predominantly occlusive throughout the femoral vein extending into the popliteal and upper calf veins. The clot appears recent with luminal expansion. Normal respiratory phasicity in the right common femoral vein. LEFT LOWER EXTREMITY Common Femoral Vein: No evidence of thrombus. Normal compressibility, respiratory phasicity and response to augmentation. Saphenofemoral Junction: No evidence of thrombus. Profunda Femoral Vein: No evidence of thrombus. Femoral Vein: No evidence of thrombus. Popliteal Vein: No evidence of thrombus. Calf Veins: No evidence of thrombus. Superficial Great Saphenous Vein: No evidence of thrombus. See chest CT report concerning communication. IMPRESSION: Extensive occlusive thrombus throughout the right femoral vein, popliteal vein, and upper calf veins. Electronically Signed   By: Monte Fantasia M.D.   On: 08/27/2015 16:07    Microbiology: No results found for this or any previous visit (from the past 240 hour(s)).   Labs: Basic Metabolic Panel:  Recent Labs Lab 08/27/15 1542 08/27/15 1645 08/28/15 0148  NA  --  139 137  K  --  4.3 4.2  CL  --  108 107  CO2  --  22 23  GLUCOSE  --  114* 113*  BUN  --  27* 27*  CREATININE 1.90* 1.82* 1.88*  CALCIUM  --  9.2 8.9   Liver Function Tests:  Recent Labs Lab 08/28/15 0148  AST 18  ALT 12*  ALKPHOS 41  BILITOT 0.9  PROT 6.1*  ALBUMIN 3.7   No results for input(s): LIPASE, AMYLASE in the last 168 hours. No results for input(s): AMMONIA in the last 168 hours. CBC:  Recent Labs Lab 08/27/15 1645 08/28/15 0148 08/29/15 0634  WBC 8.4 7.4 6.1  NEUTROABS 5.6  --   --   HGB 13.0 12.3* 12.9*  HCT 38.8* 36.9* 38.6*    MCV 86.0 85.8 85.4  PLT 170 153  163   Cardiac Enzymes:  Recent Labs Lab 08/27/15 1645  TROPONINI <0.03   BNP: BNP (last 3 results)  Recent Labs  05/29/15 1115  BNP 105.2*    ProBNP (last 3 results) No results for input(s): PROBNP in the last 8760 hours.  CBG:  Recent Labs Lab 08/27/15 2140 08/28/15 0810 08/28/15 1132  GLUCAP 147* 115* 99       Signed:  KRISHNAN,SENDIL K  [facility] 08/29/2015, 3:16 PM

## 2015-08-29 NOTE — Progress Notes (Signed)
CM talked with patient and provided a Xarelto card to use at pharmacy for prescription.  The card is for a 30 day trial to use.  Patient stated he has received some cards for the Xarelto, but accepted that one too.  No other needs provided.

## 2015-08-31 ENCOUNTER — Telehealth: Payer: Self-pay | Admitting: *Deleted

## 2015-08-31 ENCOUNTER — Other Ambulatory Visit (HOSPITAL_COMMUNITY): Payer: Medicare Other

## 2015-08-31 LAB — GLUCOSE, CAPILLARY
GLUCOSE-CAPILLARY: 125 mg/dL — AB (ref 65–99)
GLUCOSE-CAPILLARY: 108 mg/dL — AB (ref 65–99)
Glucose-Capillary: 100 mg/dL — ABNORMAL HIGH (ref 65–99)

## 2015-08-31 MED ORDER — RIVAROXABAN 20 MG PO TABS: ORAL_TABLET | ORAL | Status: DC

## 2015-08-31 MED ORDER — RIVAROXABAN 15 MG PO TABS: ORAL_TABLET | ORAL | Status: DC

## 2015-08-31 NOTE — Telephone Encounter (Signed)
Please schedule an appointment with me for Ricardo Mayer during the week that he would like it schedule.

## 2015-08-31 NOTE — Telephone Encounter (Signed)
Call Completed and Appointment Scheduled: Yes, Date: 09/29/15 with Dr Laurance Flatten. Patient had this appt scheduled before discharge. He really needs to be seen the week of 09/07/15 but there are currently no openings with Dr Laurance Flatten.  Suggested that we schedule him with another MD since Dr Laurance Flatten is cutting his working days back. They will consider this but for now would like to wait and see if Dr Laurance Flatten has an appointment become available. I will keep a note to check the schedule and follow up with them.   DISCHARGE INFORMATION Date of Discharge:08/29/15  Discharge Facility: [facility]  Principal Discharge Diagnosis: PE  Patient and/or caregiver is knowledgeable of his/her condition(s) and treatment: Yes   MEDICATION RECONCILIATION Medication list reviewed with patient: Yes  Patient is able to obtain needed medications: Yes   ACTIVITIES OF DAILY LIVING  Is the patient able to perform his/her own ADLs: Yes  Patient is receiving home health services: no   PATIENT EDUCATION Questions/Concerns Discussed: Xarelto was added. No local pharmacies carried the starter pack. [facility] made starter pack for them but insurance would only cover 20 days and not 30. Will need to contact pharmacy to see what we can do to rectify this. Prior authorization may be needed. Wife was told by pharmacy that they would contact us but I don't see a record of that phone call. I Will also follow up with them about rescheduling his appt. They will f/u sooner if needed.  [facility] at [facility]. They were able to provide Xarelto 15mg  to patient but when they submitted Xarelto 20mg  it was denied and a prior authorization is needed. PA Ph # [phone], ID # [id number].   Prior Authorization obtained # [id number] good until 08/14/16. New prescription with PA sent to pharmacy and current script updated in EMR.

## 2015-09-02 ENCOUNTER — Other Ambulatory Visit (HOSPITAL_COMMUNITY): Payer: Medicare Other

## 2015-09-02 ENCOUNTER — Encounter: Payer: Self-pay | Admitting: *Deleted

## 2015-09-04 ENCOUNTER — Other Ambulatory Visit (HOSPITAL_COMMUNITY): Payer: Medicare Other

## 2015-09-10 DIAGNOSIS — C44310 Basal cell carcinoma of skin of unspecified parts of face: Secondary | ICD-10-CM | POA: Diagnosis not present

## 2015-09-10 DIAGNOSIS — C44319 Basal cell carcinoma of skin of other parts of face: Secondary | ICD-10-CM | POA: Diagnosis not present

## 2015-09-11 ENCOUNTER — Encounter: Payer: Self-pay | Admitting: Family Medicine

## 2015-09-11 ENCOUNTER — Ambulatory Visit (INDEPENDENT_AMBULATORY_CARE_PROVIDER_SITE_OTHER): Payer: Medicare Other | Admitting: Family Medicine

## 2015-09-11 VITALS — BP 133/71 | HR 69 | Temp 96.8°F | Ht 67.0 in | Wt 239.0 lb

## 2015-09-11 DIAGNOSIS — E119 Type 2 diabetes mellitus without complications: Secondary | ICD-10-CM

## 2015-09-11 DIAGNOSIS — I2699 Other pulmonary embolism without acute cor pulmonale: Secondary | ICD-10-CM | POA: Diagnosis not present

## 2015-09-11 DIAGNOSIS — C61 Malignant neoplasm of prostate: Secondary | ICD-10-CM | POA: Diagnosis not present

## 2015-09-11 DIAGNOSIS — R0602 Shortness of breath: Secondary | ICD-10-CM | POA: Diagnosis not present

## 2015-09-11 NOTE — Patient Instructions (Signed)
The patient will continue to follow-up with his urologist and nephrologist He will stay on the blood thinner because of the pulmonary emboli

## 2015-09-11 NOTE — Progress Notes (Signed)
Subjective:    Patient ID: Ricardo Mayer, male    DOB: 1941/05/26, 75 y.o.   MRN: [id number]  HPI Patient here today for hospital follow up. He was discharged from [facility] on 08/29/15. His admission Diagnosis was PE. The patient was found to have pulmonary emboli. Hopefully this explains some of the symptoms that he has been having for several months and hopefully with taking Coumadin therapy his symptoms will be improved. The patient says his shortness of breath is improving and understands that this will take some time since he's been on the blood thinner. He denies any chest pain. He's not having any GI symptoms nausea vomiting diarrhea or blood in the stool. He's passing his water without problems and continues to see the urologist because of the prostate cancer in the seated implants.      Patient Active Problem List   Diagnosis Date Noted  . Pulmonary emboli ([place]) 08/27/2015  . Near syncope 08/25/2015  . DOE (dyspnea on exertion) 06/02/2015  . Controlled type 2 diabetes mellitus ([place]) 06/02/2015  . Melanoma, history of 10/09/2013  . Hypertension 10/09/2013  . Chronic kidney disease, stage III (moderate) 10/09/2013  . Unspecified venous (peripheral) insufficiency 10/09/2013  . Statin intolerance 10/09/2013  . Prostate cancer ([place]) 12/25/2012   Outpatient Encounter Prescriptions as of 09/11/2015  Medication Sig  . acetaminophen (TYLENOL) 500 MG tablet Take 500 mg by mouth every 6 (six) hours as needed for pain.  Marland Kitchen amLODipine-valsartan (EXFORGE) 10-320 MG per tablet TAKE 1 TABLET BY MOUTH DAILY.  . fenofibrate 160 MG tablet TAKE 1 TABLET BY MOUTH DAILY  . furosemide (LASIX) 20 MG tablet Take 1 tablet (20 mg total) by mouth daily.  Marland Kitchen glucose blood (ONE TOUCH ULTRA TEST) test strip USE FOR TESTING SUGAR 2 to 3 TIMES DAILY E11.9  . MYRBETRIQ 25 MG TB24 tablet Take 25 mg by mouth daily. Reported on 08/26/2015  . Rivaroxaban (XARELTO) 15 MG TABS tablet Take one tablet twice daily  with food on days 1-21 of treatment.  . rivaroxaban (XARELTO) 20 MG TABS tablet Take one tablet daily with food starting on day 22 of treatment.  . sitaGLIPtin (JANUVIA) 50 MG tablet Take 50 mg by mouth every evening.    No facility-administered encounter medications on file as of 09/11/2015.      Review of Systems  Constitutional: Negative.   HENT: Negative.   Eyes: Negative.   Respiratory: Positive for shortness of breath.   Cardiovascular: Negative.   Gastrointestinal: Negative.   Endocrine: Negative.   Genitourinary: Negative.   Musculoskeletal: Negative.   Skin: Negative.   Allergic/Immunologic: Negative.   Neurological: Negative.   Hematological: Negative.   Psychiatric/Behavioral: Negative.        Objective:   Physical Exam  Constitutional: He is oriented to person, place, and time. He appears well-developed and well-nourished. No distress.  HENT:  Head: Normocephalic and atraumatic.  Right Ear: External ear normal.  Left Ear: External ear normal.  Nose: Nose normal.  Mouth/Throat: Oropharynx is clear and moist. No oropharyngeal exudate.  Eyes: Conjunctivae and EOM are normal. Pupils are equal, round, and reactive to light. Right eye exhibits no discharge. Left eye exhibits no discharge. No scleral icterus.  Neck: Normal range of motion. Neck supple. No thyromegaly present.  Cardiovascular: Normal rate, regular rhythm and normal heart sounds.   No murmur heard. Pulmonary/Chest: Effort normal and breath sounds normal. No respiratory distress. He has no wheezes. He has no rales. He exhibits no  tenderness.  Lungs were clear anteriorly and posteriorly  Abdominal: Soft. Bowel sounds are normal. He exhibits no mass. There is no tenderness. There is no rebound and no guarding.  Musculoskeletal: Normal range of motion. He exhibits no edema or tenderness.  The patient has on support hose this morning and there is no apparent edema through the hose noted.  Lymphadenopathy:     He has no cervical adenopathy.  Neurological: He is alert and oriented to person, place, and time. He has normal reflexes. No cranial nerve deficit.  Skin: Skin is warm and dry. No rash noted.  Psychiatric: He has a normal mood and affect. His behavior is normal. Judgment and thought content normal.  Nursing note and vitals reviewed.  BP 133/71 mmHg  Pulse 69  Temp(Src) 96.8 F (36 C) (Oral)  Ht 5\' 7"  (1.702 m)  Wt 239 lb (108.41 kg)  BMI 37.42 kg/m2        Assessment & Plan:  1. Controlled type 2 diabetes mellitus without complication, without long-term current use of insulin ([place]) -Follow up in 1 month as already planned  2. Prostate cancer ([place]) -Continue to follow-up with urology  3. SOB (shortness of breath) on exertion -This is improving since he's been on the blood thinner  4. Other acute pulmonary embolism ([place]) -Continue blood thinner and return to clinic in 4 weeks as already planned  Patient Instructions  The patient will continue to follow-up with his urologist and nephrologist He will stay on the blood thinner because of the pulmonary emboli   Arrie Senate MD

## 2015-09-14 ENCOUNTER — Telehealth: Payer: Self-pay | Admitting: Family Medicine

## 2015-09-14 NOTE — Telephone Encounter (Signed)
Ricardo Mayer I changed pharmacy but not sure if you needed to know.

## 2015-09-16 ENCOUNTER — Telehealth: Payer: Self-pay | Admitting: Neurology

## 2015-09-16 DIAGNOSIS — H2513 Age-related nuclear cataract, bilateral: Secondary | ICD-10-CM | POA: Diagnosis not present

## 2015-09-16 DIAGNOSIS — E119 Type 2 diabetes mellitus without complications: Secondary | ICD-10-CM | POA: Diagnosis not present

## 2015-09-16 DIAGNOSIS — H52223 Regular astigmatism, bilateral: Secondary | ICD-10-CM | POA: Diagnosis not present

## 2015-09-16 DIAGNOSIS — Z7984 Long term (current) use of oral hypoglycemic drugs: Secondary | ICD-10-CM | POA: Diagnosis not present

## 2015-09-16 LAB — HM DIABETES EYE EXAM

## 2015-09-16 NOTE — Telephone Encounter (Signed)
Patient called wanting to confirm if he still needs to have MRI BRAIN WO that was ordered on 08/25/15. Please advise. Patient can be reached @ [phone]

## 2015-09-16 NOTE — Telephone Encounter (Signed)
I called the patient. There is no reason for the MRI the brain at this time, I will discontinue the order.

## 2015-09-17 ENCOUNTER — Encounter: Payer: Self-pay | Admitting: *Deleted

## 2015-09-18 ENCOUNTER — Telehealth: Payer: Self-pay | Admitting: Family Medicine

## 2015-09-18 MED ORDER — GLUCOSE BLOOD VI STRP: ORAL_STRIP | Status: DC

## 2015-09-18 NOTE — Telephone Encounter (Signed)
Stp and he states he is feeling much better this morning and he also needed rx for his test strips sent over to [facility]. Rx sent for test strips.

## 2015-09-24 ENCOUNTER — Ambulatory Visit: Payer: Medicare Other | Admitting: Family Medicine

## 2015-09-29 ENCOUNTER — Ambulatory Visit: Payer: Medicare Other | Admitting: Family Medicine

## 2015-10-07 ENCOUNTER — Ambulatory Visit (INDEPENDENT_AMBULATORY_CARE_PROVIDER_SITE_OTHER): Payer: Medicare Other | Admitting: Family Medicine

## 2015-10-07 ENCOUNTER — Encounter: Payer: Self-pay | Admitting: Family Medicine

## 2015-10-07 VITALS — BP 130/69 | HR 70 | Temp 96.8°F | Ht 67.0 in | Wt 234.0 lb

## 2015-10-07 DIAGNOSIS — E785 Hyperlipidemia, unspecified: Secondary | ICD-10-CM | POA: Diagnosis not present

## 2015-10-07 DIAGNOSIS — N183 Chronic kidney disease, stage 3 unspecified: Secondary | ICD-10-CM

## 2015-10-07 DIAGNOSIS — I10 Essential (primary) hypertension: Secondary | ICD-10-CM | POA: Diagnosis not present

## 2015-10-07 DIAGNOSIS — I2782 Chronic pulmonary embolism: Secondary | ICD-10-CM

## 2015-10-07 DIAGNOSIS — E559 Vitamin D deficiency, unspecified: Secondary | ICD-10-CM | POA: Diagnosis not present

## 2015-10-07 DIAGNOSIS — I872 Venous insufficiency (chronic) (peripheral): Secondary | ICD-10-CM

## 2015-10-07 DIAGNOSIS — E119 Type 2 diabetes mellitus without complications: Secondary | ICD-10-CM | POA: Diagnosis not present

## 2015-10-07 DIAGNOSIS — C61 Malignant neoplasm of prostate: Secondary | ICD-10-CM

## 2015-10-07 LAB — POCT GLYCOSYLATED HEMOGLOBIN (HGB A1C): HEMOGLOBIN A1C: 5.7

## 2015-10-07 NOTE — Patient Instructions (Addendum)
Medicare Annual Wellness Visit  [facility] and the medical providers at [employer] [facility] strive to bring you the best medical care.  In doing so we not only want to address your current medical conditions and concerns but also to detect new conditions early and prevent illness, disease and health-related problems.    Medicare offers a yearly Wellness Visit which allows our clinical staff to assess your need for preventative services including immunizations, lifestyle education, counseling to decrease risk of preventable diseases and screening for fall risk and other medical concerns.    This visit is provided free of charge (no copay) for all Medicare recipients. The clinical pharmacists at [facility] have begun to conduct these Wellness Visits which will also include a thorough review of all your medications.    As you primary medical provider recommend that you make an appointment for your Annual Wellness Visit if you have not done so already this year.  You may set up this appointment before you leave today or you may call back [phone] and schedule an appointment.  Please make sure when you call that you mention that you are scheduling your Annual Wellness Visit with the clinical pharmacist so that the appointment may be made for the proper length of time.     Continue current medications. Continue good therapeutic lifestyle changes which include good diet and exercise. Fall precautions discussed with patient. If an FOBT was given today- please return it to our front desk. If you are over 5 years old - you may need Prevnar 20 or the adult Pneumonia vaccine.  **Flu shots are available--- please call and schedule a FLU-CLINIC appointment**  After your visit with [name] today you will receive a survey in the mail or online from [employer] regarding your care with [name]. Please take a moment to fill this out. Your feedback is very  important to [name] as you can help [name] better understand your patient needs as well as improve your experience and satisfaction. WE CARE ABOUT YOU!!!   The patient should continue to follow-up with the urologist and the nephrologist according to their recommendations He should continue to monitor his blood sugars at home and stay as active as possible and bring these readings to each visit Continue to work on weight loss with diet and exercise

## 2015-10-07 NOTE — Progress Notes (Signed)
Subjective:    Patient ID: Ricardo Mayer, male    DOB: 1941/01/10, 75 y.o.   MRN: [id number]  HPI Pt here for follow up and management of chronic medical problems which includes diabetes, hypertension and CKD.  He is taking medication regularly. The patient comes in today with no specific complaints. He brings blood sugars in for review. These numbers are good for the morning with an average of about 121 fasting. 2 hours after dinner the average about 150 with one outlier at 224. The significant history for this patient is that he is now on a blood thinner because of pulmonary emboli from thrombophlebitis. He is had no further symptoms of syncope chest pain or shortness of breath. The patient is in good spirits and is staying active. He is wearing his support hose daily. Continues to be followed by the urologist and the nephrologist. He is due to get lab work for the nephrologist and we will make sure that everything that we do encompasses what the nephrologist is wanting. He denies any chest pain shortness of breath trouble swallowing heartburn indigestion and nausea vomiting diarrhea or blood in the stool. He is passing his water frequently as usual and does see the urologist regularly and wears a depends.     Patient Active Problem List   Diagnosis Date Noted  . Pulmonary emboli ([place]) 08/27/2015  . Near syncope 08/25/2015  . DOE (dyspnea on exertion) 06/02/2015  . Controlled type 2 diabetes mellitus ([place]) 06/02/2015  . Melanoma, history of 10/09/2013  . Hypertension 10/09/2013  . Chronic kidney disease, stage III (moderate) 10/09/2013  . Unspecified venous (peripheral) insufficiency 10/09/2013  . Statin intolerance 10/09/2013  . Prostate cancer ([place]) 12/25/2012   Outpatient Encounter Prescriptions as of 10/07/2015  Medication Sig  . acetaminophen (TYLENOL) 500 MG tablet Take 500 mg by mouth every 6 (six) hours as needed for pain.  Marland Kitchen amLODipine-valsartan (EXFORGE) 10-320 MG per tablet  TAKE 1 TABLET BY MOUTH DAILY.  . fenofibrate 160 MG tablet TAKE 1 TABLET BY MOUTH DAILY  . furosemide (LASIX) 20 MG tablet Take 1 tablet (20 mg total) by mouth daily.  Marland Kitchen glucose blood (ONE TOUCH ULTRA TEST) test strip USE FOR TESTING SUGAR 2 to 3 TIMES DAILY E11.9  . MYRBETRIQ 25 MG TB24 tablet Take 25 mg by mouth daily. Reported on 08/26/2015  . rivaroxaban (XARELTO) 20 MG TABS tablet Take one tablet daily with food starting on day 22 of treatment.  . sitaGLIPtin (JANUVIA) 50 MG tablet Take 50 mg by mouth every evening.   . [DISCONTINUED] Rivaroxaban (XARELTO) 15 MG TABS tablet Take one tablet twice daily with food on days 1-21 of treatment.   No facility-administered encounter medications on file as of 10/07/2015.      Review of Systems  Constitutional: Negative.   HENT: Negative.   Eyes: Negative.   Respiratory: Negative.   Cardiovascular: Negative.   Gastrointestinal: Negative.   Endocrine: Negative.   Genitourinary: Negative.   Musculoskeletal: Negative.   Skin: Negative.   Allergic/Immunologic: Negative.   Neurological: Negative.   Hematological: Negative.   Psychiatric/Behavioral: Negative.        Objective:   Physical Exam  Constitutional: He is oriented to person, place, and time. He appears well-developed and well-nourished. No distress.  HENT:  Head: Normocephalic and atraumatic.  Right Ear: External ear normal.  Left Ear: External ear normal.  Nose: Nose normal.  Mouth/Throat: Oropharynx is clear and moist. No oropharyngeal exudate.  Eyes: Conjunctivae and  EOM are normal. Pupils are equal, round, and reactive to light. Right eye exhibits no discharge. Left eye exhibits no discharge. No scleral icterus.  The patient gets his eyes checked yearly and is up-to-date and having no problems  Neck: Normal range of motion. Neck supple. No thyromegaly present.  No bruits thyromegaly or adenopathy  Cardiovascular: Normal rate, regular rhythm, normal heart sounds and intact  distal pulses.   No murmur heard. The heart is regular at 60/m  Pulmonary/Chest: Effort normal and breath sounds normal. No respiratory distress. He has no wheezes. He has no rales. He exhibits no tenderness.  Clear anteriorly and posteriorly and no axillary adenopathy  Abdominal: Soft. Bowel sounds are normal. He exhibits no mass. There is no tenderness. There is no rebound and no guarding.  Obesity without masses tenderness or bruits or organ enlargement  Genitourinary:  The patient is followed regularly by his urologist, Dr. Jeffie Pollock  Musculoskeletal: Normal range of motion. He exhibits no edema or tenderness.  Lymphadenopathy:    He has no cervical adenopathy.  Neurological: He is alert and oriented to person, place, and time. He has normal reflexes. No cranial nerve deficit.  Skin: Skin is warm and dry. Rash noted. There is erythema. No pallor.  The patient has redness and some slight erythema of the right lower extremity compared to the left due to his venous insufficiency.  Psychiatric: He has a normal mood and affect. His behavior is normal. Judgment and thought content normal.  Nursing note and vitals reviewed.   BP 130/69 mmHg  Pulse 70  Temp(Src) 96.8 F (36 C) (Oral)  Ht _0  (1.702 m)  Wt 234 lb (106.142 kg)  BMI 36.64 kg/m2       Assessment & Plan:  1. Controlled type 2 diabetes mellitus without complication, without long-term current use of insulin ([place]) -Patient will continue current treatment pending results of lab work - POCT glycosylated hemoglobin (Hb A1C) - BMP8+EGFR - CBC with Differential/Platelet  2. Prostate cancer ([place]) -Continue to follow-up with urology - CBC with Differential/Platelet  3. Chronic kidney disease, stage III (moderate) -Continue to follow-up with nephrology, Dr. Posey Pronto - BMP8+EGFR - CBC with Differential/Platelet  4. Vitamin D deficiency -The patient has a history of vitamin D deficiency but is currently not taking any vitamin D  because of his renal insufficiency. - CBC with Differential/Platelet - VITAMIN D 25 Hydroxy (Vit-D Deficiency, Fractures)  5. Essential hypertension -The blood pressure is good today and he will continue with current treatment - BMP8+EGFR - CBC with Differential/Platelet - Hepatic function panel  6. Hyperlipidemia -Continue with fenofibrate pending results of lab work - CBC with Differential/Platelet - NMR, lipoprofile  7. Venous (peripheral) insufficiency -Continue with TEDs hose  8. 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 He still has shortness of breath but this is improving.  Patient Instructions                       Medicare Annual Wellness Visit  [facility] and the medical providers at [facility] strive to bring you the best medical care.  In doing so we not only want to address your current medical conditions and concerns but also to detect new conditions early and prevent illness, disease and health-related problems.    Medicare offers a yearly Wellness Visit which allows our clinical staff to assess your  need for preventative services including immunizations, lifestyle education, counseling to decrease risk of preventable diseases and screening for fall risk and other medical concerns.    This visit is provided free of charge (no copay) for all Medicare recipients. The clinical pharmacists at [facility] have begun to conduct these Wellness Visits which will also include a thorough review of all your medications.    As you primary medical provider recommend that you make an appointment for your Annual Wellness Visit if you have not done so already this year.  You may set up this appointment before you leave today or you may call back ([phone]) and schedule an appointment.  Please make sure when you call that you mention  that you are scheduling your Annual Wellness Visit with the clinical pharmacist so that the appointment may be made for the proper length of time.     Continue current medications. Continue good therapeutic lifestyle changes which include good diet and exercise. Fall precautions discussed with patient. If an FOBT was given today- please return it to our front desk. If you are over 31 years old - you may need Prevnar 53 or the adult Pneumonia vaccine.  **Flu shots are available--- please call and schedule a FLU-CLINIC appointment**  After your visit with [name] today you will receive a survey in the mail or online from [employer] regarding your care with [name]. Please take a moment to fill this out. Your feedback is very important to [name] as you can help [name] better understand your patient needs as well as improve your experience and satisfaction. WE CARE ABOUT YOU!!!   The patient should continue to follow-up with the urologist and the nephrologist according to their recommendations He should continue to monitor his blood sugars at home and stay as active as possible and bring these readings to each visit Continue to work on weight loss with diet and exercise    Arrie Senate MD

## 2015-10-12 DIAGNOSIS — M170 Bilateral primary osteoarthritis of knee: Secondary | ICD-10-CM | POA: Diagnosis not present

## 2015-10-12 LAB — CBC WITH DIFFERENTIAL/PLATELET
BASOS: 0 %
Basophils Absolute: 0 10*3/uL (ref 0.0–0.2)
EOS (ABSOLUTE): 0.1 10*3/uL (ref 0.0–0.4)
EOS: 2 %
HEMOGLOBIN: 14.2 g/dL (ref 12.6–17.7)
Hematocrit: 42.3 % (ref 37.5–51.0)
IMMATURE GRANS (ABS): 0 10*3/uL (ref 0.0–0.1)
IMMATURE GRANULOCYTES: 0 %
LYMPHS: 26 %
Lymphocytes Absolute: 1.8 10*3/uL (ref 0.7–3.1)
MCH: 28.1 pg (ref 26.6–33.0)
MCHC: 33.6 g/dL (ref 31.5–35.7)
MCV: 84 fL (ref 79–97)
MONOCYTES: 7 %
Monocytes Absolute: 0.5 10*3/uL (ref 0.1–0.9)
NEUTROS ABS: 4.6 10*3/uL (ref 1.4–7.0)
NEUTROS PCT: 65 %
PLATELETS: 182 10*3/uL (ref 150–379)
RBC: 5.05 x10E6/uL (ref 4.14–5.80)
RDW: 16.2 % — ABNORMAL HIGH (ref 12.3–15.4)
WBC: 7 10*3/uL (ref 3.4–10.8)

## 2015-10-12 LAB — HEPATIC FUNCTION PANEL
ALK PHOS: 55 IU/L (ref 39–117)
ALT: 13 IU/L (ref 0–44)
AST: 18 IU/L (ref 0–40)
Albumin: 4.5 g/dL (ref 3.5–4.8)
BILIRUBIN TOTAL: 0.6 mg/dL (ref 0.0–1.2)
BILIRUBIN, DIRECT: 0.2 mg/dL (ref 0.00–0.40)
Total Protein: 6.7 g/dL (ref 6.0–8.5)

## 2015-10-12 LAB — BK QUANT PCR (PLASMA/SERUM): BK Quantitaion PCR: NEGATIVE copies/mL

## 2015-10-12 LAB — TACROLIMUS LEVEL: Tacrolimus Lvl: NOT DETECTED ng/mL (ref 2.0–20.0)

## 2015-10-12 LAB — BMP8+EGFR
BUN/Creatinine Ratio: 14 (ref 10–22)
BUN: 26 mg/dL (ref 8–27)
CALCIUM: 9.7 mg/dL (ref 8.6–10.2)
CO2: 17 mmol/L — AB (ref 18–29)
CREATININE: 1.8 mg/dL — AB (ref 0.76–1.27)
Chloride: 102 mmol/L (ref 96–106)
GFR, EST AFRICAN AMERICAN: 42 mL/min/{1.73_m2} — AB (ref >59)
GFR, EST NON AFRICAN AMERICAN: 36 mL/min/{1.73_m2} — AB (ref >59)
Glucose: 108 mg/dL — ABNORMAL HIGH (ref 65–99)
POTASSIUM: 5 mmol/L (ref 3.5–5.2)
Sodium: 141 mmol/L (ref 134–144)

## 2015-10-12 LAB — VITAMIN D 25 HYDROXY (VIT D DEFICIENCY, FRACTURES): Vit D, 25-Hydroxy: 26 ng/mL — ABNORMAL LOW (ref 30.0–100.0)

## 2015-10-12 LAB — NMR, LIPOPROFILE
CHOLESTEROL: 94 mg/dL — AB (ref 100–199)
HDL Cholesterol by NMR: 43 mg/dL (ref >39)
HDL Particle Number: 27 umol/L — ABNORMAL LOW (ref >=30.5)
LDL-C: 38 mg/dL (ref 0–99)
LP-IR SCORE: 52 — AB (ref <=45)
TRIGLYCERIDES BY NMR: 67 mg/dL (ref 0–149)

## 2015-10-12 LAB — EPSTEIN BARR VRS(EBV DNA BY PCR): EPSTEIN-BARR DNA QUANT, PCR: NEGATIVE {copies}/mL

## 2015-10-12 LAB — MAGNESIUM: MAGNESIUM: 2.2 mg/dL (ref 1.6–2.3)

## 2015-10-12 LAB — CMV DNA, QUANTITATIVE, PCR: CMV DNA Quant: NEGATIVE IU/mL

## 2015-10-12 LAB — PHOSPHORUS: PHOSPHORUS: 3.3 mg/dL (ref 2.5–4.5)

## 2015-10-17 ENCOUNTER — Other Ambulatory Visit: Payer: Self-pay | Admitting: Family Medicine

## 2015-10-23 DIAGNOSIS — N183 Chronic kidney disease, stage 3 (moderate): Secondary | ICD-10-CM | POA: Diagnosis not present

## 2015-10-23 DIAGNOSIS — I129 Hypertensive chronic kidney disease with stage 1 through stage 4 chronic kidney disease, or unspecified chronic kidney disease: Secondary | ICD-10-CM | POA: Diagnosis not present

## 2015-10-23 DIAGNOSIS — N2581 Secondary hyperparathyroidism of renal origin: Secondary | ICD-10-CM | POA: Diagnosis not present

## 2015-10-23 DIAGNOSIS — D631 Anemia in chronic kidney disease: Secondary | ICD-10-CM | POA: Diagnosis not present

## 2015-10-28 ENCOUNTER — Encounter: Payer: Self-pay | Admitting: Family Medicine

## 2015-10-28 DIAGNOSIS — D631 Anemia in chronic kidney disease: Secondary | ICD-10-CM

## 2015-10-28 DIAGNOSIS — E211 Secondary hyperparathyroidism, not elsewhere classified: Secondary | ICD-10-CM | POA: Insufficient documentation

## 2015-10-28 DIAGNOSIS — N189 Chronic kidney disease, unspecified: Secondary | ICD-10-CM | POA: Insufficient documentation

## 2015-11-05 ENCOUNTER — Other Ambulatory Visit: Payer: Self-pay | Admitting: Dermatology

## 2015-11-05 DIAGNOSIS — L570 Actinic keratosis: Secondary | ICD-10-CM | POA: Diagnosis not present

## 2015-11-05 DIAGNOSIS — D485 Neoplasm of uncertain behavior of skin: Secondary | ICD-10-CM | POA: Diagnosis not present

## 2015-11-05 DIAGNOSIS — C44319 Basal cell carcinoma of skin of other parts of face: Secondary | ICD-10-CM | POA: Diagnosis not present

## 2015-11-17 DIAGNOSIS — M170 Bilateral primary osteoarthritis of knee: Secondary | ICD-10-CM | POA: Diagnosis not present

## 2015-11-26 DIAGNOSIS — M170 Bilateral primary osteoarthritis of knee: Secondary | ICD-10-CM | POA: Diagnosis not present

## 2015-12-04 DIAGNOSIS — M170 Bilateral primary osteoarthritis of knee: Secondary | ICD-10-CM | POA: Diagnosis not present

## 2015-12-18 ENCOUNTER — Other Ambulatory Visit: Payer: Self-pay | Admitting: Family Medicine

## 2015-12-18 ENCOUNTER — Other Ambulatory Visit: Payer: Self-pay | Admitting: Physician Assistant

## 2016-01-13 DIAGNOSIS — M25562 Pain in left knee: Secondary | ICD-10-CM | POA: Diagnosis not present

## 2016-01-13 DIAGNOSIS — M170 Bilateral primary osteoarthritis of knee: Secondary | ICD-10-CM | POA: Diagnosis not present

## 2016-01-13 DIAGNOSIS — M25561 Pain in right knee: Secondary | ICD-10-CM | POA: Diagnosis not present

## 2016-01-21 ENCOUNTER — Other Ambulatory Visit: Payer: Medicare Other

## 2016-01-21 ENCOUNTER — Other Ambulatory Visit: Payer: Self-pay | Admitting: Family Medicine

## 2016-01-21 DIAGNOSIS — E785 Hyperlipidemia, unspecified: Secondary | ICD-10-CM

## 2016-01-21 DIAGNOSIS — E1122 Type 2 diabetes mellitus with diabetic chronic kidney disease: Secondary | ICD-10-CM | POA: Diagnosis not present

## 2016-01-21 DIAGNOSIS — C61 Malignant neoplasm of prostate: Secondary | ICD-10-CM

## 2016-01-21 DIAGNOSIS — E559 Vitamin D deficiency, unspecified: Secondary | ICD-10-CM | POA: Diagnosis not present

## 2016-01-21 DIAGNOSIS — N183 Chronic kidney disease, stage 3 unspecified: Secondary | ICD-10-CM

## 2016-01-21 DIAGNOSIS — I10 Essential (primary) hypertension: Secondary | ICD-10-CM | POA: Diagnosis not present

## 2016-01-21 LAB — BAYER DCA HB A1C WAIVED: HB A1C (BAYER DCA - WAIVED): 6.1 % (ref <7.0)

## 2016-01-25 LAB — BMP8+EGFR
BUN/Creatinine Ratio: 20 (ref 10–24)
BUN: 41 mg/dL — ABNORMAL HIGH (ref 8–27)
CALCIUM: 9.7 mg/dL (ref 8.6–10.2)
CHLORIDE: 101 mmol/L (ref 96–106)
CO2: 21 mmol/L (ref 18–29)
Creatinine, Ser: 2.07 mg/dL — ABNORMAL HIGH (ref 0.76–1.27)
GFR calc Af Amer: 35 mL/min/{1.73_m2} — ABNORMAL LOW (ref >59)
GFR, EST NON AFRICAN AMERICAN: 31 mL/min/{1.73_m2} — AB (ref >59)
Glucose: 111 mg/dL — ABNORMAL HIGH (ref 65–99)
POTASSIUM: 4.8 mmol/L (ref 3.5–5.2)
Sodium: 136 mmol/L (ref 134–144)

## 2016-01-25 LAB — CBC WITH DIFFERENTIAL/PLATELET
BASOS ABS: 0 10*3/uL (ref 0.0–0.2)
Basos: 0 %
EOS (ABSOLUTE): 0.1 10*3/uL (ref 0.0–0.4)
Eos: 2 %
Hematocrit: 43.3 % (ref 37.5–51.0)
Hemoglobin: 14.5 g/dL (ref 12.6–17.7)
IMMATURE GRANS (ABS): 0 10*3/uL (ref 0.0–0.1)
IMMATURE GRANULOCYTES: 0 %
LYMPHS: 24 %
Lymphocytes Absolute: 1.9 10*3/uL (ref 0.7–3.1)
MCH: 28.3 pg (ref 26.6–33.0)
MCHC: 33.5 g/dL (ref 31.5–35.7)
MCV: 85 fL (ref 79–97)
Monocytes Absolute: 0.6 10*3/uL (ref 0.1–0.9)
Monocytes: 8 %
NEUTROS PCT: 66 %
Neutrophils Absolute: 5.2 10*3/uL (ref 1.4–7.0)
PLATELETS: 177 10*3/uL (ref 150–379)
RBC: 5.12 x10E6/uL (ref 4.14–5.80)
RDW: 16.2 % — ABNORMAL HIGH (ref 12.3–15.4)
WBC: 7.9 10*3/uL (ref 3.4–10.8)

## 2016-01-25 LAB — NMR, LIPOPROFILE
CHOLESTEROL: 108 mg/dL (ref 100–199)
HDL Cholesterol by NMR: 40 mg/dL (ref >39)
HDL PARTICLE NUMBER: 23.3 umol/L — AB (ref >=30.5)
LDL-C: 49 mg/dL (ref 0–99)
LP-IR SCORE: 69 — AB (ref <=45)
Small LDL Particle Number: <90 nmol/L (ref <=527)
TRIGLYCERIDES BY NMR: 96 mg/dL (ref 0–149)

## 2016-01-25 LAB — TESTOSTERONE,FREE AND TOTAL
TESTOSTERONE FREE: 11.6 pg/mL (ref 6.6–18.1)
Testosterone: 382 ng/dL (ref 348–1197)

## 2016-01-25 LAB — HEPATIC FUNCTION PANEL
ALBUMIN: 4.4 g/dL (ref 3.5–4.8)
ALK PHOS: 57 IU/L (ref 39–117)
ALT: 10 IU/L (ref 0–44)
AST: 15 IU/L (ref 0–40)
BILIRUBIN, DIRECT: 0.12 mg/dL (ref 0.00–0.40)
Bilirubin Total: 0.3 mg/dL (ref 0.0–1.2)
TOTAL PROTEIN: 6.4 g/dL (ref 6.0–8.5)

## 2016-01-25 LAB — PSA, TOTAL AND FREE
PSA FREE PCT: 12 %
PSA FREE: 0.06 ng/mL
Prostate Specific Ag, Serum: 0.5 ng/mL (ref 0.0–4.0)

## 2016-01-25 LAB — VITAMIN D 25 HYDROXY (VIT D DEFICIENCY, FRACTURES): Vit D, 25-Hydroxy: 41 ng/mL (ref 30.0–100.0)

## 2016-01-29 ENCOUNTER — Ambulatory Visit (INDEPENDENT_AMBULATORY_CARE_PROVIDER_SITE_OTHER): Payer: Medicare Other | Admitting: Urology

## 2016-01-29 DIAGNOSIS — C61 Malignant neoplasm of prostate: Secondary | ICD-10-CM | POA: Diagnosis not present

## 2016-01-29 DIAGNOSIS — R351 Nocturia: Secondary | ICD-10-CM

## 2016-01-29 DIAGNOSIS — N3941 Urge incontinence: Secondary | ICD-10-CM | POA: Diagnosis not present

## 2016-02-02 ENCOUNTER — Other Ambulatory Visit: Payer: Self-pay | Admitting: Physician Assistant

## 2016-02-02 DIAGNOSIS — L820 Inflamed seborrheic keratosis: Secondary | ICD-10-CM | POA: Diagnosis not present

## 2016-02-02 DIAGNOSIS — D485 Neoplasm of uncertain behavior of skin: Secondary | ICD-10-CM | POA: Diagnosis not present

## 2016-02-04 DIAGNOSIS — M1711 Unilateral primary osteoarthritis, right knee: Secondary | ICD-10-CM | POA: Diagnosis not present

## 2016-02-11 ENCOUNTER — Ambulatory Visit: Payer: Medicare Other | Admitting: Family Medicine

## 2016-02-15 ENCOUNTER — Encounter: Payer: Self-pay | Admitting: Family Medicine

## 2016-02-15 ENCOUNTER — Ambulatory Visit (INDEPENDENT_AMBULATORY_CARE_PROVIDER_SITE_OTHER): Payer: Medicare Other | Admitting: Family Medicine

## 2016-02-15 VITALS — BP 117/72 | HR 78 | Temp 97.1°F | Ht 67.0 in | Wt 236.0 lb

## 2016-02-15 DIAGNOSIS — M1711 Unilateral primary osteoarthritis, right knee: Secondary | ICD-10-CM | POA: Diagnosis not present

## 2016-02-15 DIAGNOSIS — Z789 Other specified health status: Secondary | ICD-10-CM

## 2016-02-15 DIAGNOSIS — E119 Type 2 diabetes mellitus without complications: Secondary | ICD-10-CM

## 2016-02-15 DIAGNOSIS — I2699 Other pulmonary embolism without acute cor pulmonale: Secondary | ICD-10-CM

## 2016-02-15 DIAGNOSIS — E785 Hyperlipidemia, unspecified: Secondary | ICD-10-CM | POA: Diagnosis not present

## 2016-02-15 DIAGNOSIS — Z889 Allergy status to unspecified drugs, medicaments and biological substances status: Secondary | ICD-10-CM | POA: Diagnosis not present

## 2016-02-15 DIAGNOSIS — I129 Hypertensive chronic kidney disease with stage 1 through stage 4 chronic kidney disease, or unspecified chronic kidney disease: Secondary | ICD-10-CM | POA: Diagnosis not present

## 2016-02-15 DIAGNOSIS — E559 Vitamin D deficiency, unspecified: Secondary | ICD-10-CM | POA: Diagnosis not present

## 2016-02-15 DIAGNOSIS — N183 Chronic kidney disease, stage 3 unspecified: Secondary | ICD-10-CM

## 2016-02-15 DIAGNOSIS — I10 Essential (primary) hypertension: Secondary | ICD-10-CM

## 2016-02-15 DIAGNOSIS — C61 Malignant neoplasm of prostate: Secondary | ICD-10-CM | POA: Diagnosis not present

## 2016-02-15 MED ORDER — SITAGLIPTIN PHOSPHATE 50 MG PO TABS: ORAL_TABLET | ORAL | Status: DC

## 2016-02-15 MED ORDER — RIVAROXABAN 20 MG PO TABS: ORAL_TABLET | ORAL | Status: DC

## 2016-02-15 MED ORDER — AMLODIPINE BESYLATE-VALSARTAN 10-320 MG PO TABS: ORAL_TABLET | ORAL | Status: DC

## 2016-02-15 NOTE — Patient Instructions (Addendum)
Medicare Annual Wellness Visit  [facility] and the medical providers at [facility] strive to bring you the best medical care.  In doing so we not only want to address your current medical conditions and concerns but also to detect new conditions early and prevent illness, disease and health-related problems.    Medicare offers a yearly Wellness Visit which allows our clinical staff to assess your need for preventative services including immunizations, lifestyle education, counseling to decrease risk of preventable diseases and screening for fall risk and other medical concerns.    This visit is provided free of charge (no copay) for all Medicare recipients. The clinical pharmacists at [facility] have begun to conduct these Wellness Visits which will also include a thorough review of all your medications.    As you primary medical provider recommend that you make an appointment for your Annual Wellness Visit if you have not done so already this year.  You may set up this appointment before you leave today or you may call back [phone] and schedule an appointment.  Please make sure when you call that you mention that you are scheduling your Annual Wellness Visit with the clinical pharmacist so that the appointment may be made for the proper length of time.     Continue current medications. Continue good therapeutic lifestyle changes which include good diet and exercise. Fall precautions discussed with patient. If an FOBT was given today- please return it to our front desk. If you are over 94 years old - you may need Prevnar 49 or the adult Pneumonia vaccine.  **Flu shots are available--- please call and schedule a FLU-CLINIC appointment**  After your visit with [name] today you will receive a survey in the mail or online from [employer] regarding your care with [name]. Please take a moment to fill this out. Your feedback is very  important to [name] as you can help [name] better understand your patient needs as well as improve your experience and satisfaction. WE CARE ABOUT YOU!!!   Continue to follow-up with orthopedics Continue to follow-up with nephrology urology We will check with cardiology regarding the evaluation that was done earlier in the year to make sure that they feel that you're our a candidate for right knee replacement We will also check with nephrology and vascular surgery in light of your history of chronic kidney disease and pulmonary emboli

## 2016-02-15 NOTE — Progress Notes (Signed)
Subjective:    Patient ID: Ricardo Mayer, male    DOB: 11-25-1940, 75 y.o.   MRN: [id number]  HPI Pt here for follow up and management of chronic medical problems which includes diabetes and hypertension. He is taking medications regularly.The patient has arthralgias in his right knee is currently seeing an orthopedist for this. This patient has a multitude of problems including a history of melanoma. He has controlled type 2 diabetes. He has chronic kidney disease stage III moderate and sees a nephrologist for this. He also has history of prostate cancer. 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 He's had recent lab work done and this will be reviewed with him during the visit today. His most recent hemoglobin A1c was 6.1%. His blood sugar slightly elevated at 111. The creatinine is elevated at 2.07. Will be sent to his nephrologist. The CBC had a normal white blood cell count with a stable hemoglobin at 14.5. All liver function tests were normal and the PSA remains low and stable at 0.5 and the patient does have a testosterone level that is at the low end of the normal range. All cholesterol numbers with advanced lipid testing were excellent except for low HDL particle number. The patient denies any chest pain or tightness or pressure in his chest. He saw the cardiologist back early in the year and was given a good report from the cardiologist at that time. He is had the pulmonary emboli since back in the fall but has been treated for this since then be wary of this year. He is having no more problems with shortness of breath or pulmonary emboli since he's on the blood thinner. He denies any shortness of breath. He denies any problem with heartburn indigestion nausea vomiting diarrhea or blood in the stool. He's passing his water without problems other than the frequency. He has seen the urologist recently and his most recent PSA was stable compared to those previously.  He continues to take the Xarelto for the pulmonary emboli.     Patient Active Problem List   Diagnosis Date Noted  . Anemia associated with chronic renal failure 10/28/2015  . Hyperparathyroidism , secondary, non-renal ([place]) 10/28/2015  . Pulmonary emboli ([place]) 08/27/2015  . Near syncope 08/25/2015  . DOE (dyspnea on exertion) 06/02/2015  . Controlled type 2 diabetes mellitus ([place]) 06/02/2015  . Melanoma, history of 10/09/2013  . 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 Chronic kidney disease, stage III (moderate) 10/09/2013  . Venous (peripheral) insufficiency 10/09/2013  . Statin intolerance 10/09/2013  . Prostate cancer ([place]) 12/25/2012   Outpatient Encounter Prescriptions as of 02/15/2016  Medication Sig  . acetaminophen (TYLENOL) 500 MG tablet Take 500 mg by mouth every 6 (six) hours as needed for pain.  Marland Kitchen amLODipine-valsartan (EXFORGE) 10-320 MG per tablet TAKE 1 TABLET BY MOUTH DAILY.  . fenofibrate 160 MG tablet TAKE 1 TABLET BY MOUTH DAILY  . furosemide (LASIX) 20 MG tablet Take 1 tablet (20 mg total) by mouth daily.  Marland Kitchen glucose blood (ONE TOUCH ULTRA TEST) test strip USE FOR TESTING SUGAR 2 to 3 TIMES DAILY E11.9  . JANUVIA 50 MG tablet TAKE 1 TABLET (50 MG TOTAL) BY MOUTH DAILY.  . MYRBETRIQ 25 MG TB24 tablet Take 25 mg by mouth daily. Reported on 08/26/2015  . rivaroxaban (XARELTO) 20 MG TABS tablet Take one tablet by mouth daily with food  . [DISCONTINUED] amLODipine-valsartan (EXFORGE) 10-320 MG tablet TAKE  1 TABLET BY MOUTH DAILY.  . [DISCONTINUED] sitaGLIPtin (JANUVIA) 50 MG tablet Take 50 mg by mouth every evening.    No facility-administered encounter medications on file as of 02/15/2016.      Review of Systems  Constitutional: Negative.   HENT: Negative.   Eyes: Negative.   Respiratory: Negative.   Cardiovascular: Negative.   Gastrointestinal: Negative.   Endocrine: Negative.   Genitourinary: Negative.     Musculoskeletal: Positive for arthralgias (right knee - Dr Alvan Dame).  Skin: Negative.   Allergic/Immunologic: Negative.   Neurological: Negative.   Hematological: Negative.   Psychiatric/Behavioral: Negative.        Objective:   Physical Exam  Constitutional: He is oriented to person, place, and time. He appears well-developed and well-nourished. No distress.  HENT:  Head: Normocephalic and atraumatic.  Right Ear: External ear normal.  Left Ear: External ear normal.  Nose: Nose normal.  Mouth/Throat: Oropharynx is clear and moist. No oropharyngeal exudate.  Eyes: Conjunctivae and EOM are normal. Pupils are equal, round, and reactive to light. Right eye exhibits no discharge. Left eye exhibits no discharge. No scleral icterus.  Neck: Normal range of motion. Neck supple. No thyromegaly present.  Cardiovascular: Normal rate, regular rhythm, normal heart sounds and intact distal pulses.   No murmur heard. Heart has a regular rate and rhythm at 72/m  Pulmonary/Chest: Effort normal and breath sounds normal. No respiratory distress. He has no wheezes. He has no rales. He exhibits no tenderness.  Clear anteriorly and posteriorly  Abdominal: Soft. Bowel sounds are normal. He exhibits no mass. There is no tenderness. There is no rebound and no guarding.  Abdominal obesity without masses tenderness or organ enlargement  Genitourinary:  The prostate cancer is followed every 6 months by Dr. Jeffie Pollock  Musculoskeletal: He exhibits no edema or tenderness.  The patient has ongoing chronic knee pain on the right with increased pain with prolonged standing or walking. There is minimal joint line tenderness. He says he feels that the knee gives way with him. He can no longer take the injections that were helping him.  Lymphadenopathy:    He has no cervical adenopathy.  Neurological: He is alert and oriented to person, place, and time. He has normal reflexes. No cranial nerve deficit.  Skin: Skin is warm  and dry. No rash noted.  Psychiatric: He has a normal mood and affect. His behavior is normal. Judgment and thought content normal.  Nursing note and vitals reviewed.  BP 117/72 mmHg  Pulse 78  Temp(Src) 97.1 F (36.2 C) (Oral)  Ht 5\' 7"  (1.702 m)  Wt 236 lb (107.049 kg)  BMI 36.95 kg/m2        Assessment & Plan:  1. Chronic kidney disease, stage III (moderate) -The most recent creatinine was slightly elevated more than previously over to this time. -Before the patient has his knee replacement we will discuss this finding with his nephrologist  2. Prostate cancer [facility]) -Intended to follow-up with urology  3. Controlled type 2 diabetes mellitus without complication, without long-term current use of insulin (HCC) -The blood sugar has been under good control with a good hemoglobin A1c.  4. Hyperlipidemia -The patient has excellent lipid control based on the most recent panel. He will continue with aggressive therapeutic lifestyle changes  5. Essential hypertension -The blood pressure is good and he will continue with current treatment  6. Vitamin D deficiency -Continue with current treatment  7. Pulmonary embolism, other ([place]) -No further episodes of shortness of  breath since the patient has been on Xarelto  8. 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 Statin intolerance -Continue aggressive therapeutic lifestyle changes  10. Primary osteoarthritis of right knee -Surgery pending with orthopedic secondary to a case from cardiology nephrology and vascular surgery  . Patient Instructions                       Medicare Annual Wellness Visit  [place] and the medical providers at [facility] strive to bring you the best medical care.  In doing so we not only want to address your current medical conditions and concerns but also to detect new conditions early and prevent illness,  disease and health-related problems.    Medicare offers a yearly Wellness Visit which allows our clinical staff to assess your need for preventative services including immunizations, lifestyle education, counseling to decrease risk of preventable diseases and screening for fall risk and other medical concerns.    This visit is provided free of charge (no copay) for all Medicare recipients. The clinical pharmacists at [facility] have begun to conduct these Wellness Visits which will also include a thorough review of all your medications.    As you primary medical provider recommend that you make an appointment for your Annual Wellness Visit if you have not done so already this year.  You may set up this appointment before you leave today or you may call back [phone]) and schedule an appointment.  Please make sure when you call that you mention that you are scheduling your Annual Wellness Visit with the clinical pharmacist so that the appointment may be made for the proper length of time.     Continue current medications. Continue good therapeutic lifestyle changes which include good diet and exercise. Fall precautions discussed with patient. If an FOBT was given today- please return it to our front desk. If you are over 33 years old - you may need Prevnar 43 or the adult Pneumonia vaccine.  **Flu shots are available--- please call and schedule a FLU-CLINIC appointment**  After your visit with [name] today you will receive a survey in the mail or online from [employer] regarding your care with [name]. Please take a moment to fill this out. Your feedback is very important to [name] as you can help [name] better understand your patient needs as well as improve your experience and satisfaction. WE CARE ABOUT YOU!!!   Continue to follow-up with orthopedics Continue to follow-up with nephrology urology We will check with cardiology regarding the evaluation that was done earlier in the year  to make sure that they feel that you're our a candidate for right knee replacement We will also check with nephrology and vascular surgery in light of your history of chronic kidney disease and pulmonary emboli   Arrie Senate MD

## 2016-02-19 ENCOUNTER — Telehealth: Payer: Self-pay | Admitting: Family Medicine

## 2016-02-19 NOTE — Telephone Encounter (Signed)
Please make sure that patient was made aware of nephrology clearance from Dr. Posey Pronto.

## 2016-02-22 NOTE — Telephone Encounter (Signed)
Pt aware.

## 2016-03-11 ENCOUNTER — Encounter (HOSPITAL_COMMUNITY): Payer: Self-pay

## 2016-03-11 NOTE — Progress Notes (Signed)
[id number], Stress11'16, CXR 10'16, CT Chest/abd/pelvis 1'17 Epic.

## 2016-03-14 ENCOUNTER — Encounter (HOSPITAL_COMMUNITY): Payer: Self-pay

## 2016-03-14 ENCOUNTER — Encounter (INDEPENDENT_AMBULATORY_CARE_PROVIDER_SITE_OTHER): Payer: Self-pay

## 2016-03-14 ENCOUNTER — Encounter (HOSPITAL_COMMUNITY)
Admission: RE | Admit: 2016-03-14 | Discharge: 2016-03-14 | Disposition: A | Discharge status: Home / Self Care | Payer: Medicare Other | Source: Ambulatory Visit | Attending: Orthopedic Surgery | Admitting: Orthopedic Surgery

## 2016-03-14 DIAGNOSIS — Z7901 Long term (current) use of anticoagulants: Secondary | ICD-10-CM | POA: Insufficient documentation

## 2016-03-14 DIAGNOSIS — Z01812 Encounter for preprocedural laboratory examination: Secondary | ICD-10-CM | POA: Diagnosis not present

## 2016-03-14 DIAGNOSIS — N183 Chronic kidney disease, stage 3 (moderate): Secondary | ICD-10-CM | POA: Diagnosis not present

## 2016-03-14 DIAGNOSIS — E211 Secondary hyperparathyroidism, not elsewhere classified: Secondary | ICD-10-CM | POA: Diagnosis not present

## 2016-03-14 DIAGNOSIS — Z86718 Personal history of other venous thrombosis and embolism: Secondary | ICD-10-CM | POA: Diagnosis not present

## 2016-03-14 DIAGNOSIS — Z8546 Personal history of malignant neoplasm of prostate: Secondary | ICD-10-CM | POA: Insufficient documentation

## 2016-03-14 DIAGNOSIS — Z86711 Personal history of pulmonary embolism: Secondary | ICD-10-CM | POA: Diagnosis not present

## 2016-03-14 DIAGNOSIS — I872 Venous insufficiency (chronic) (peripheral): Secondary | ICD-10-CM | POA: Diagnosis not present

## 2016-03-14 DIAGNOSIS — D631 Anemia in chronic kidney disease: Secondary | ICD-10-CM | POA: Insufficient documentation

## 2016-03-14 DIAGNOSIS — I129 Hypertensive chronic kidney disease with stage 1 through stage 4 chronic kidney disease, or unspecified chronic kidney disease: Secondary | ICD-10-CM | POA: Diagnosis not present

## 2016-03-14 DIAGNOSIS — Z0183 Encounter for blood typing: Secondary | ICD-10-CM | POA: Insufficient documentation

## 2016-03-14 DIAGNOSIS — M1711 Unilateral primary osteoarthritis, right knee: Secondary | ICD-10-CM | POA: Insufficient documentation

## 2016-03-14 DIAGNOSIS — E1122 Type 2 diabetes mellitus with diabetic chronic kidney disease: Secondary | ICD-10-CM | POA: Diagnosis not present

## 2016-03-14 HISTORY — DX: Reserved for inherently not codable concepts without codable children: IMO0001

## 2016-03-14 HISTORY — DX: Unspecified hearing loss, unspecified ear: H91.90

## 2016-03-14 LAB — CBC
HCT: 43.9 % (ref 39.0–52.0)
HEMOGLOBIN: 14.6 g/dL (ref 13.0–17.0)
MCH: 28.4 pg (ref 26.0–34.0)
MCHC: 33.3 g/dL (ref 30.0–36.0)
MCV: 85.4 fL (ref 78.0–100.0)
PLATELETS: 180 10*3/uL (ref 150–400)
RBC: 5.14 MIL/uL (ref 4.22–5.81)
RDW: 14.3 % (ref 11.5–15.5)
WBC: 9.2 10*3/uL (ref 4.0–10.5)

## 2016-03-14 LAB — BASIC METABOLIC PANEL
ANION GAP: 7 (ref 5–15)
BUN: 49 mg/dL — ABNORMAL HIGH (ref 6–20)
CALCIUM: 9.5 mg/dL (ref 8.9–10.3)
CHLORIDE: 110 mmol/L (ref 101–111)
CO2: 21 mmol/L — AB (ref 22–32)
Creatinine, Ser: 1.97 mg/dL — ABNORMAL HIGH (ref 0.61–1.24)
GFR calc non Af Amer: 31 mL/min — ABNORMAL LOW (ref >60)
GFR, EST AFRICAN AMERICAN: 37 mL/min — AB (ref >60)
Glucose, Bld: 112 mg/dL — ABNORMAL HIGH (ref 65–99)
Potassium: 5.6 mmol/L — ABNORMAL HIGH (ref 3.5–5.1)
SODIUM: 138 mmol/L (ref 135–145)

## 2016-03-14 LAB — PROTIME-INR
INR: 1.01
PROTHROMBIN TIME: 13.3 s (ref 11.4–15.2)

## 2016-03-14 LAB — SURGICAL PCR SCREEN
MRSA, PCR: NEGATIVE
Staphylococcus aureus: NEGATIVE

## 2016-03-14 LAB — ABO/RH: ABO/RH(D): A NEG

## 2016-03-14 NOTE — Progress Notes (Signed)
Your Pt has screened with an elevated risk for obstructive sleep apnea using the Stop-Bang tool during a presurgical  Visit.

## 2016-03-14 NOTE — Progress Notes (Signed)
03-14-16 1650 Labs viewable in Epic, note BMP- hx of known renal insufficiency and stable last 3 yrs per pt.

## 2016-03-14 NOTE — H&P (Signed)
TOTAL KNEE ADMISSION H&P  Patient is being admitted for right total knee arthroplasty.  Subjective:  Chief Complaint:    Right knee primary OA / pain  HPI: Ricardo Huguenin., 75 y.o. male, has a history of pain and functional disability in the right knee due to arthritis and has failed non-surgical conservative treatments for greater than 12 weeks to include NSAID's and/or analgesics, corticosteriod injections, viscosupplementation injections, use of assistive devices and activity modification.  Onset of symptoms was gradual, starting 10-15 years ago with gradually worsening course since that time. The patient noted no past surgery on the right knee(s).  Patient currently rates pain in the right knee(s) at 9 out of 10 with activity. Patient has night pain, worsening of pain with activity and weight bearing, pain that interferes with activities of daily living, pain with passive range of motion, crepitus and joint swelling.  Patient has evidence of periarticular osteophytes and joint space narrowing by imaging studies.  There is no active infection.   Risks, benefits and expectations were discussed with the patient.  Risks including but not limited to the risk of anesthesia, blood clots, nerve damage, blood vessel damage, failure of the prosthesis, infection and up to and including death.  Patient understand the risks, benefits and expectations and wishes to proceed with surgery.   PCP: Ricardo Gainer, MD  D/C Plans:      Home  Post-op Meds:       No Rx given  Tranexamic Acid:      To be given - topical  (previous DVT & PE)  Decadron:      Is to be given  FYI:     Xarelto (on pre-op)  Norco    Patient Active Problem List   Diagnosis Date Noted  . Anemia associated with chronic renal failure 10/28/2015  . Hyperparathyroidism , secondary, non-renal ([place]) 10/28/2015  . Pulmonary emboli ([place]) 08/27/2015  . Near syncope 08/25/2015  . DOE (dyspnea on exertion) 06/02/2015  . Controlled type 2  diabetes mellitus ([place]) 06/02/2015  . Melanoma, history of 10/09/2013  . 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 Chronic kidney disease, stage III (moderate) 10/09/2013  . Venous (peripheral) insufficiency 10/09/2013  . Statin intolerance 10/09/2013  . Prostate cancer ([place]) 12/25/2012   Past Medical History:  Diagnosis Date  . Adenomatous polyp of colon   . Arthritis    bilateral knees  . CKD (chronic kidney disease), stage II    Dr. Patel,nephrology "stable last 3 yrs"  . Diabetes mellitus without complication ([place])    Dr. Laurance Flatten.PCP  . History of basal cell cancer   . History of DVT of lower extremity    2011--  LEFT LEG  . History of kidney stones   . History of melanoma excision    RIGHT ARM  . Hyperlipidemia   . Hypertension   . Impaired hearing    hearing aids bilateral  . Melanoma ([place]) 1990   arm and back -Melanoma, basal cell others- Dr. Edgardo Roys follows.  . Near syncope 08/25/2015   Dr. Roland Rack once"found blood clots in lung"  . Nocturia   . Prostate cancer [facility]) 2014   Dr. Cherylann Parr- "seed implant" '14  . Ulcer of left lower leg ([place])    recent pcp visit dr Laurance Flatten--  ordered betadine wet/dry dsg daily (03-18-2013)  [place] IN 1 WEEK    Past Surgical History:  Procedure Laterality Date  . KIDNEY STONE EXTRACTIONS  LAST ONE 1970   X2 OPEN/  X1  URETEROSCOPY  . MELANOMA EXCISION RIGHT ARM  1990  . PROSTATE BIOPSY    . RADIOACTIVE SEED IMPLANT N/A 03/21/2013   Procedure: RADIOACTIVE SEED IMPLANT;  Surgeon: Malka So, MD;  Location: [facility];  Service: Urology;  Laterality: N/A;  87 seeds implanted    . TONSILLECTOMY      No prescriptions prior to admission.   No Known Allergies   Social History  Substance Use Topics  . Smoking status: Never Smoker  . Smokeless tobacco: Never Used  . Alcohol use No    Family History  Problem Relation Age of Onset  . Breast cancer  Mother   . Stroke Mother   . Hypertension Mother   . Lung cancer Father   . Ulcers Father   . Esophageal cancer Brother   . Cancer Paternal Uncle     prostate  . Cancer Paternal Grandfather     prostate  . Heart attack Neg Hx      Review of Systems  Constitutional: Negative.   HENT: Positive for hearing loss.   Eyes: Negative.   Respiratory: Negative.   Cardiovascular: Negative.   Gastrointestinal: Negative.   Genitourinary: Positive for frequency.  Musculoskeletal: Positive for joint pain.  Skin: Negative.   Neurological: Negative.   Endo/Heme/Allergies: Negative.   Psychiatric/Behavioral: Negative.     Objective:  Physical Exam  Constitutional: He is oriented to person, place, and time. He appears well-developed.  HENT:  Head: Normocephalic.  Eyes: Pupils are equal, round, and reactive to light.  Neck: Neck supple. No JVD present. No tracheal deviation present. No thyromegaly present.  Cardiovascular: Normal rate, regular rhythm, normal heart sounds and intact distal pulses.   Respiratory: Effort normal and breath sounds normal. No respiratory distress. He has no wheezes.  GI: Soft. There is no tenderness. There is no guarding.  Musculoskeletal:       Right knee: He exhibits decreased range of motion, swelling, abnormal alignment and bony tenderness. He exhibits no ecchymosis, no deformity, no laceration and no erythema. Tenderness found.  Lymphadenopathy:    He has no cervical adenopathy.  Neurological: He is alert and oriented to person, place, and time.  Skin: Skin is warm and dry.  Psychiatric: He has a normal mood and affect.    Vital signs in last 24 hours: Temp:  [97.8 F (36.6 C)] 97.8 F (36.6 C) (07/31 0838) Pulse Rate:  [68] 68 (07/31 0838) Resp:  [18] 18 (07/31 0838) BP: (141)/(62) 141/62 (07/31 0838) SpO2:  [100 %] 100 % (07/31 0838) Weight:  [105.7 kg (233 lb)] 105.7 kg (233 lb) (07/31 0838)  Labs:   Estimated body mass index is 36.49 kg/m  as calculated from the following:   Height as of 03/14/16: 5\' 7"  (1.702 m).   Weight as of 03/14/16: 105.7 kg (233 lb).   Imaging Review Plain radiographs demonstrate severe degenerative joint disease of the right knee(s). The overall alignment is mild varus. The bone quality appears to be good for age and reported activity level.  Assessment/Plan:  End stage arthritis, right knee   The patient history, physical examination, clinical judgment of the provider and imaging studies are consistent with end stage degenerative joint disease of the right knee(s) and total knee arthroplasty is deemed medically necessary. The treatment options including medical management, injection therapy arthroscopy and arthroplasty were discussed at length. The risks and benefits of total knee arthroplasty were presented and reviewed.  The risks due to aseptic loosening, infection, stiffness, patella tracking problems, thromboembolic complications and other imponderables were discussed. The patient acknowledged the explanation, agreed to proceed with the plan and consent was signed. Patient is being admitted for inpatient treatment for surgery, pain control, PT, OT, prophylactic antibiotics, VTE prophylaxis, progressive ambulation and ADL's and discharge planning. The patient is planning to be discharged home.    [place] Ekam Bonebrake   PA-C  03/14/2016, 9:49 AM

## 2016-03-14 NOTE — Patient Instructions (Signed)
Abdul Shiver.  03/14/2016   Your procedure is scheduled on: 03-21-16  Report to [facility] Main  Entrance take [facility]  elevators to 3rd floor to  [place] at  [place] AM.  Call this number if you have problems the morning of surgery [phone]   Remember: ONLY 1 PERSON MAY GO WITH YOU TO SHORT STAY TO GET  READY MORNING OF [place].  Do not eat food or drink liquids :After Midnight.     Take these medicines the morning of surgery with A SIP OF WATER: Fenofibrate. Myrbetriq. Short Stay will give (Amlodipine 10mg  orally on arrival instead of EXforge) DO NOT TAKE ANY DIABETIC MEDICATIONS DAY OF YOUR SURGERY                               You may not have any metal on your body including hair pins and              piercings  Do not wear jewelry, make-up, lotions, powders or perfumes, deodorant             Do not wear nail polish.  Do not shave  48 hours prior to surgery.              Men may shave face and neck.   Do not bring valuables to the hospital. [place].  Contacts, dentures or bridgework may not be worn into surgery.  Leave suitcase in the car. After surgery it may be brought to your room.     Patients discharged the day of surgery will not be allowed to drive home.  Name and phone number of your driver:Janelle-spouse /son -[phone] h  Special Instructions: N/A              Please read over the following fact sheets you were given: _____________________________________________________________________             [facility] - Preparing for Surgery Before surgery, you can play an important role.  Because skin is not sterile, your skin needs to be as free of germs as possible.  You can reduce the number of germs on your skin by washing with CHG (chlorahexidine gluconate) soap before surgery.  CHG is an antiseptic cleaner which kills germs and bonds with the skin to continue killing  germs even after washing. Please DO NOT use if you have an allergy to CHG or antibacterial soaps.  If your skin becomes reddened/irritated stop using the CHG and inform your nurse when you arrive at Short Stay. Do not shave (including legs and underarms) for at least 48 hours prior to the first CHG shower.  You may shave your face/neck. Please follow these instructions carefully:  1.  Shower with CHG Soap the night before surgery and the  morning of Surgery.  2.  If you choose to wash your hair, wash your hair first as usual with your  normal  shampoo.  3.  After you shampoo, rinse your hair and body thoroughly to remove the  shampoo.                           4.  Use CHG as you  would any other liquid soap.  You can apply chg directly  to the skin and wash                       Gently with a scrungie or clean washcloth.  5.  Apply the CHG Soap to your body ONLY FROM THE NECK DOWN.   Do not use on face/ open                           Wound or open sores. Avoid contact with eyes, ears mouth and genitals (private parts).                       Wash face,  Genitals (private parts) with your normal soap.             6.  Wash thoroughly, paying special attention to the area where your surgery  will be performed.  7.  Thoroughly rinse your body with warm water from the neck down.  8.  DO NOT shower/wash with your normal soap after using and rinsing off  the CHG Soap.                9.  Pat yourself dry with a clean towel.            10.  Wear clean pajamas.            11.  Place clean sheets on your bed the night of your first shower and do not  sleep with pets. Day of Surgery : Do not apply any lotions/deodorants the morning of surgery.  Please wear clean clothes to the hospital/surgery center.  FAILURE TO FOLLOW THESE INSTRUCTIONS MAY RESULT IN THE CANCELLATION OF YOUR SURGERY PATIENT SIGNATURE_________________________________  NURSE  SIGNATURE__________________________________  ________________________________________________________________________   Adam Phenix  An incentive spirometer is a tool that can help keep your lungs clear and active. This tool measures how well you are filling your lungs with each breath. Taking long deep breaths may help reverse or decrease the chance of developing breathing (pulmonary) problems (especially infection) following:  A long period of time when you are unable to move or be active. BEFORE THE PROCEDURE   If the spirometer includes an indicator to show your best effort, your nurse or respiratory therapist will set it to a desired goal.  If possible, sit up straight or lean slightly forward. Try not to slouch.  Hold the incentive spirometer in an upright position. INSTRUCTIONS FOR USE  1. Sit on the edge of your bed if possible, or sit up as far as you can in bed or on a chair. 2. Hold the incentive spirometer in an upright position. 3. Breathe out normally. 4. Place the mouthpiece in your mouth and seal your lips tightly around it. 5. Breathe in slowly and as deeply as possible, raising the piston or the ball toward the top of the column. 6. Hold your breath for 3-5 seconds or for as long as possible. Allow the piston or ball to fall to the bottom of the column. 7. Remove the mouthpiece from your mouth and breathe out normally. 8. Rest for a few seconds and repeat Steps 1 through 7 at least 10 times every 1-2 hours when you are awake. Take your time and take a few normal breaths between deep breaths. 9. The spirometer may include an indicator to show your best effort. Use the indicator  as a goal to work toward during each repetition. 10. After each set of 10 deep breaths, practice coughing to be sure your lungs are clear. If you have an incision (the cut made at the time of surgery), support your incision when coughing by placing a pillow or rolled up towels firmly  against it. Once you are able to get out of bed, walk around indoors and cough well. You may stop using the incentive spirometer when instructed by your caregiver.  RISKS AND COMPLICATIONS  Take your time so you do not get dizzy or light-headed.  If you are in pain, you may need to take or ask for pain medication before doing incentive spirometry. It is harder to take a deep breath if you are having pain. AFTER USE  Rest and breathe slowly and easily.  It can be helpful to keep track of a log of your progress. Your caregiver can provide you with a simple table to help with this. If you are using the spirometer at home, follow these instructions: [place] IF:   You are having difficultly using the spirometer.  You have trouble using the spirometer as often as instructed.  Your pain medication is not giving enough relief while using the spirometer.  You develop fever of 100.5 F (38.1 C) or higher. SEEK IMMEDIATE MEDICAL CARE IF:   You cough up bloody sputum that had not been present before.  You develop fever of 102 F (38.9 C) or greater.  You develop worsening pain at or near the incision site. MAKE SURE YOU:   Understand these instructions.  Will watch your condition.  Will get help right away if you are not doing well or get worse. Document Released: 12/12/2006 Document Revised: 10/24/2011 Document Reviewed: 02/12/2007 ExitCare Patient Information 2014 Exit[employer], [place].   ________________________________________________________________________  WHAT IS A BLOOD TRANSFUSION? Blood Transfusion Information  A transfusion is the replacement of blood or some of its parts. Blood is made up of multiple cells which provide different functions.  Red blood cells carry oxygen and are used for blood loss replacement.  White blood cells fight against infection.  Platelets control bleeding.  Plasma helps clot blood.  Other blood products are available for  specialized needs, such as hemophilia or other clotting disorders. BEFORE THE TRANSFUSION  Who gives blood for transfusions?   Healthy volunteers who are fully evaluated to make sure their blood is safe. This is blood bank blood. Transfusion therapy is the safest it has ever been in the practice of medicine. Before blood is taken from a donor, a complete history is taken to make sure that person has no history of diseases nor engages in risky social behavior (examples are intravenous drug use or sexual activity with multiple partners). The donor's travel history is screened to minimize risk of transmitting infections, such as malaria. The donated blood is tested for signs of infectious diseases, such as HIV and hepatitis. The blood is then tested to be sure it is compatible with you in order to minimize the chance of a transfusion reaction. If you or a relative donates blood, this is often done in anticipation of surgery and is not appropriate for emergency situations. It takes many days to process the donated blood. RISKS AND COMPLICATIONS Although transfusion therapy is very safe and saves many lives, the main dangers of transfusion include:   Getting an infectious disease.  Developing a transfusion reaction. This is an allergic reaction to something in the blood you were  given. Every precaution is taken to prevent this. The decision to have a blood transfusion has been considered carefully by your caregiver before blood is given. Blood is not given unless the benefits outweigh the risks. AFTER THE TRANSFUSION  Right after receiving a blood transfusion, you will usually feel much better and more energetic. This is especially true if your red blood cells have gotten low (anemic). The transfusion raises the level of the red blood cells which carry oxygen, and this usually causes an energy increase.  The nurse administering the transfusion will monitor you carefully for complications. HOME CARE  INSTRUCTIONS  No special instructions are needed after a transfusion. You may find your energy is better. Speak with your caregiver about any limitations on activity for underlying diseases you may have. SEEK MEDICAL CARE IF:   Your condition is not improving after your transfusion.  You develop redness or irritation at the intravenous (IV) site. SEEK IMMEDIATE MEDICAL CARE IF:  Any of the following symptoms occur over the next 12 hours:  Shaking chills.  You have a temperature by mouth above 102 F (38.9 C), not controlled by medicine.  Chest, back, or muscle pain.  People around you feel you are not acting correctly or are confused.  Shortness of breath or difficulty breathing.  Dizziness and fainting.  You get a rash or develop hives.  You have a decrease in urine output.  Your urine turns a dark color or changes to pink, red, or brown. Any of the following symptoms occur over the next 10 days:  You have a temperature by mouth above 102 F (38.9 C), not controlled by medicine.  Shortness of breath.  Weakness after normal activity.  The white part of the eye turns yellow (jaundice).  You have a decrease in the amount of urine or are urinating less often.  Your urine turns a dark color or changes to pink, red, or brown. Document Released: 07/29/2000 Document Revised: 10/24/2011 Document Reviewed: 03/17/2008 [facility] Patient Information 2014 [place], [place].  _______________________________________________________________________

## 2016-03-14 NOTE — Pre-Procedure Instructions (Signed)
Echo 10'16,EKG 1'17,Stress 11'16, CXR 10'16, CT Chest abd/pelvis 1'17 Epic. Stop/Bang note sent to PCP,Dr. Laurance Flatten.

## 2016-03-15 ENCOUNTER — Other Ambulatory Visit: Payer: Self-pay

## 2016-03-15 DIAGNOSIS — G4730 Sleep apnea, unspecified: Secondary | ICD-10-CM

## 2016-03-15 LAB — HEMOGLOBIN A1C
Hgb A1c MFr Bld: 6.2 % — ABNORMAL HIGH (ref 4.8–5.6)
Mean Plasma Glucose: 131 mg/dL

## 2016-03-18 NOTE — Progress Notes (Signed)
03-18-16 These lab results- BMP , CBC faxed to your attention Dr. Laurance Flatten per request of Dr. Paralee Cancel- pt. Scheduled for surgery 03-21-16.Pt. Has a history of known renal insufficiency. Any actions should be forwarded to Dr. Reynaldo Minium office. [phone]. Phone/ fax [phone].

## 2016-03-20 NOTE — Anesthesia Preprocedure Evaluation (Addendum)
Anesthesia Evaluation  Patient identified by MRN, date of birth, ID band Patient awake    Reviewed: Allergy & Precautions, NPO status , Patient's Chart, lab work & pertinent test results  Airway Mallampati: III  TM Distance: >3 FB Neck ROM: Full    Dental  (+) Teeth Intact, Dental Advisory Given   Pulmonary neg pulmonary ROS,    breath sounds clear to auscultation       Cardiovascular hypertension, Pt. on medications + Peripheral Vascular Disease   Rhythm:Regular Rate:Normal     Neuro/Psych negative neurological ROS  negative psych ROS   GI/Hepatic negative GI ROS, Neg liver ROS,   Endo/Other  diabetes, Type 2, Oral Hypoglycemic Agents  Renal/GU CRFRenal disease  negative genitourinary   Musculoskeletal  (+) Arthritis ,   Abdominal   Peds negative pediatric ROS (+)  Hematology negative hematology ROS (+)   Anesthesia Other Findings - HLD  Reproductive/Obstetrics                            Lab Results  Component Value Date   WBC 9.2 03/14/2016   HGB 14.6 03/14/2016   HCT 43.9 03/14/2016   MCV 85.4 03/14/2016   PLT 180 03/14/2016   Lab Results  Component Value Date   CREATININE 1.97 (H) 03/14/2016   BUN 49 (H) 03/14/2016   NA 138 03/14/2016   K 5.6 (H) 03/14/2016   CL 110 03/14/2016   CO2 21 (L) 03/14/2016   Lab Results  Component Value Date   INR 1.01 03/14/2016   INR 1.12 08/27/2015   INR 1.0 03/15/2013   08/2015 EKG: NSR  06/2015 Echo - Left ventricle: The cavity size was normal. Systolic function was   normal. The estimated ejection fraction was in the range of 55%   to 60%. Wall motion was normal; there were no regional wall   motion abnormalities. - Aortic valve: Calcified right coronary cusp. There was trivial   regurgitation. - Left atrium: The atrium was mildly to moderately dilated. - Atrial septum: No defect or patent foramen ovale was  identified.  Anesthesia Physical Anesthesia Plan  ASA: III  Anesthesia Plan: Spinal   Post-op Pain Management:    Induction: Intravenous  Airway Management Planned: Natural Airway  Additional Equipment:   Intra-op Plan:   Post-operative Plan:   Informed Consent: I have reviewed the patients History and Physical, chart, labs and discussed the procedure including the risks, benefits and alternatives for the proposed anesthesia with the patient or authorized representative who has indicated his/her understanding and acceptance.     Plan Discussed with: CRNA  Anesthesia Plan Comments:         Anesthesia Quick Evaluation

## 2016-03-21 ENCOUNTER — Inpatient Hospital Stay (HOSPITAL_COMMUNITY): Payer: Medicare Other | Admitting: Anesthesiology

## 2016-03-21 ENCOUNTER — Encounter (HOSPITAL_COMMUNITY): Payer: Self-pay

## 2016-03-21 ENCOUNTER — Encounter (HOSPITAL_COMMUNITY)
Admission: RE | Disposition: A | Discharge status: Home / Self Care | Payer: Self-pay | Source: Ambulatory Visit | Attending: Orthopedic Surgery

## 2016-03-21 ENCOUNTER — Inpatient Hospital Stay (HOSPITAL_COMMUNITY)
Admission: RE | Admit: 2016-03-21 | Discharge: 2016-03-22 | DRG: 470 | Disposition: A | Discharge status: Home / Self Care | Payer: Medicare Other | Source: Ambulatory Visit | Attending: Orthopedic Surgery | Admitting: Orthopedic Surgery

## 2016-03-21 DIAGNOSIS — H9193 Unspecified hearing loss, bilateral: Secondary | ICD-10-CM | POA: Diagnosis not present

## 2016-03-21 DIAGNOSIS — I129 Hypertensive chronic kidney disease with stage 1 through stage 4 chronic kidney disease, or unspecified chronic kidney disease: Secondary | ICD-10-CM | POA: Diagnosis present

## 2016-03-21 DIAGNOSIS — M1711 Unilateral primary osteoarthritis, right knee: Secondary | ICD-10-CM | POA: Diagnosis not present

## 2016-03-21 DIAGNOSIS — N183 Chronic kidney disease, stage 3 (moderate): Secondary | ICD-10-CM | POA: Diagnosis not present

## 2016-03-21 DIAGNOSIS — Z8546 Personal history of malignant neoplasm of prostate: Secondary | ICD-10-CM

## 2016-03-21 DIAGNOSIS — I739 Peripheral vascular disease, unspecified: Secondary | ICD-10-CM | POA: Diagnosis present

## 2016-03-21 DIAGNOSIS — Z6836 Body mass index (BMI) 36.0-36.9, adult: Secondary | ICD-10-CM

## 2016-03-21 DIAGNOSIS — Z85820 Personal history of malignant melanoma of skin: Secondary | ICD-10-CM

## 2016-03-21 DIAGNOSIS — Z86718 Personal history of other venous thrombosis and embolism: Secondary | ICD-10-CM

## 2016-03-21 DIAGNOSIS — M179 Osteoarthritis of knee, unspecified: Secondary | ICD-10-CM | POA: Diagnosis not present

## 2016-03-21 DIAGNOSIS — Z86711 Personal history of pulmonary embolism: Secondary | ICD-10-CM | POA: Diagnosis not present

## 2016-03-21 DIAGNOSIS — E119 Type 2 diabetes mellitus without complications: Secondary | ICD-10-CM | POA: Diagnosis not present

## 2016-03-21 DIAGNOSIS — Z96651 Presence of right artificial knee joint: Secondary | ICD-10-CM

## 2016-03-21 DIAGNOSIS — I878 Other specified disorders of veins: Secondary | ICD-10-CM | POA: Diagnosis present

## 2016-03-21 DIAGNOSIS — Z96659 Presence of unspecified artificial knee joint: Secondary | ICD-10-CM

## 2016-03-21 DIAGNOSIS — M659 Synovitis and tenosynovitis, unspecified: Secondary | ICD-10-CM | POA: Diagnosis present

## 2016-03-21 DIAGNOSIS — M25561 Pain in right knee: Secondary | ICD-10-CM | POA: Diagnosis not present

## 2016-03-21 HISTORY — PX: TOTAL KNEE ARTHROPLASTY: SHX125

## 2016-03-21 LAB — TYPE AND SCREEN
ABO/RH(D): A NEG
Antibody Screen: NEGATIVE

## 2016-03-21 LAB — GLUCOSE, CAPILLARY
GLUCOSE-CAPILLARY: 114 mg/dL — AB (ref 65–99)
Glucose-Capillary: 139 mg/dL — ABNORMAL HIGH (ref 65–99)
Glucose-Capillary: 200 mg/dL — ABNORMAL HIGH (ref 65–99)
Glucose-Capillary: 159 mg/dL — ABNORMAL HIGH (ref 65–99)

## 2016-03-21 SURGERY — ARTHROPLASTY, KNEE, TOTAL
Anesthesia: Spinal | Site: Knee | Laterality: Right

## 2016-03-21 MED ORDER — IRBESARTAN 150 MG PO TABS
300.0000 mg | ORAL_TABLET | Freq: Every day | ORAL | Status: DC
  Filled 2016-03-21 (×2): qty 2

## 2016-03-21 MED ORDER — DEXAMETHASONE SODIUM PHOSPHATE 10 MG/ML IJ SOLN
10.0000 mg | Freq: Once | INTRAMUSCULAR | Status: AC
  Administered 2016-03-21: 10 mg via INTRAVENOUS

## 2016-03-21 MED ORDER — PHENYLEPHRINE HCL 10 MG/ML IJ SOLN
INTRAMUSCULAR | Status: DC | PRN
  Administered 2016-03-21: 80 ug via INTRAVENOUS
  Administered 2016-03-21: 40 ug via INTRAVENOUS
  Administered 2016-03-21 (×2): 80 ug via INTRAVENOUS
  Administered 2016-03-21: 40 ug via INTRAVENOUS

## 2016-03-21 MED ORDER — SODIUM CHLORIDE 0.9 % IJ SOLN
INTRAMUSCULAR | Status: AC
  Filled 2016-03-21: qty 10

## 2016-03-21 MED ORDER — CHLORHEXIDINE GLUCONATE 4 % EX LIQD: 60.0000 mL | Freq: Once | CUTANEOUS | Status: DC

## 2016-03-21 MED ORDER — DEXAMETHASONE SODIUM PHOSPHATE 10 MG/ML IJ SOLN
INTRAMUSCULAR | Status: AC
  Filled 2016-03-21: qty 1

## 2016-03-21 MED ORDER — SODIUM CHLORIDE 0.9 % IR SOLN
Status: DC | PRN
  Administered 2016-03-21: 1000 mL

## 2016-03-21 MED ORDER — DOCUSATE SODIUM 100 MG PO CAPS
100.0000 mg | ORAL_CAPSULE | Freq: Two times a day (BID) | ORAL | Status: DC
  Administered 2016-03-21 – 2016-03-22 (×2): 100 mg via ORAL
  Filled 2016-03-21 (×2): qty 1

## 2016-03-21 MED ORDER — FERROUS SULFATE 325 (65 FE) MG PO TABS
325.0000 mg | ORAL_TABLET | Freq: Three times a day (TID) | ORAL | Status: DC
  Administered 2016-03-22 (×2): 325 mg via ORAL
  Filled 2016-03-21 (×2): qty 1

## 2016-03-21 MED ORDER — FENTANYL CITRATE (PF) 100 MCG/2ML IJ SOLN
INTRAMUSCULAR | Status: DC | PRN
  Administered 2016-03-21 (×2): 50 ug via INTRAVENOUS

## 2016-03-21 MED ORDER — HYDROCODONE-ACETAMINOPHEN 7.5-325 MG PO TABS
1.0000 | ORAL_TABLET | ORAL | Status: DC
  Administered 2016-03-21 – 2016-03-22 (×5): 1 via ORAL
  Filled 2016-03-21: qty 2
  Filled 2016-03-21 (×2): qty 1
  Filled 2016-03-21: qty 2
  Filled 2016-03-21: qty 1
  Filled 2016-03-21: qty 2

## 2016-03-21 MED ORDER — TRANEXAMIC ACID 1000 MG/10ML IV SOLN
INTRAVENOUS | Status: DC | PRN
  Administered 2016-03-21: 2000 mg via TOPICAL

## 2016-03-21 MED ORDER — BISACODYL 10 MG RE SUPP: 10.0000 mg | Freq: Every day | RECTAL | Status: DC | PRN

## 2016-03-21 MED ORDER — MIDAZOLAM HCL 2 MG/2ML IJ SOLN
INTRAMUSCULAR | Status: AC
  Filled 2016-03-21: qty 2

## 2016-03-21 MED ORDER — AMLODIPINE BESYLATE-VALSARTAN 10-320 MG PO TABS: 1.0000 | ORAL_TABLET | Freq: Every day | ORAL | Status: DC

## 2016-03-21 MED ORDER — RIVAROXABAN 10 MG PO TABS
10.0000 mg | ORAL_TABLET | ORAL | Status: DC
  Administered 2016-03-22: 10 mg via ORAL
  Filled 2016-03-21: qty 1

## 2016-03-21 MED ORDER — KETOROLAC TROMETHAMINE 30 MG/ML IJ SOLN
INTRAMUSCULAR | Status: DC | PRN
  Administered 2016-03-21: 30 mg

## 2016-03-21 MED ORDER — LINAGLIPTIN 5 MG PO TABS
5.0000 mg | ORAL_TABLET | Freq: Every day | ORAL | Status: DC
  Administered 2016-03-21 – 2016-03-22 (×2): 5 mg via ORAL
  Filled 2016-03-21 (×2): qty 1

## 2016-03-21 MED ORDER — MIDAZOLAM HCL 5 MG/5ML IJ SOLN
INTRAMUSCULAR | Status: DC | PRN
  Administered 2016-03-21: 2 mg via INTRAVENOUS

## 2016-03-21 MED ORDER — AMLODIPINE BESYLATE 10 MG PO TABS
10.0000 mg | ORAL_TABLET | Freq: Once | ORAL | Status: AC
  Administered 2016-03-21: 10 mg via ORAL
  Filled 2016-03-21: qty 1

## 2016-03-21 MED ORDER — ALUM & MAG HYDROXIDE-SIMETH 200-200-20 MG/5ML PO SUSP: 30.0000 mL | ORAL | Status: DC | PRN

## 2016-03-21 MED ORDER — FENTANYL CITRATE (PF) 100 MCG/2ML IJ SOLN: 25.0000 ug | INTRAMUSCULAR | Status: DC | PRN

## 2016-03-21 MED ORDER — BUPIVACAINE-EPINEPHRINE (PF) 0.25% -1:200000 IJ SOLN
INTRAMUSCULAR | Status: DC | PRN
  Administered 2016-03-21: 30 mL

## 2016-03-21 MED ORDER — LACTATED RINGERS IV SOLN: INTRAVENOUS | Status: DC

## 2016-03-21 MED ORDER — ONDANSETRON HCL 4 MG/2ML IJ SOLN: 4.0000 mg | Freq: Four times a day (QID) | INTRAMUSCULAR | Status: DC | PRN

## 2016-03-21 MED ORDER — POLYETHYLENE GLYCOL 3350 17 G PO PACK
17.0000 g | PACK | Freq: Two times a day (BID) | ORAL | Status: DC
  Administered 2016-03-21 – 2016-03-22 (×2): 17 g via ORAL
  Filled 2016-03-21 (×2): qty 1

## 2016-03-21 MED ORDER — AMLODIPINE BESYLATE 10 MG PO TABS
10.0000 mg | ORAL_TABLET | Freq: Every day | ORAL | Status: DC
  Filled 2016-03-21 (×2): qty 1

## 2016-03-21 MED ORDER — DIPHENHYDRAMINE HCL 25 MG PO CAPS: 25.0000 mg | ORAL_CAPSULE | Freq: Four times a day (QID) | ORAL | Status: DC | PRN

## 2016-03-21 MED ORDER — MEPERIDINE HCL 50 MG/ML IJ SOLN
6.2500 mg | INTRAMUSCULAR | Status: DC | PRN
Start: 2016-03-21 — End: 2016-03-21

## 2016-03-21 MED ORDER — METHOCARBAMOL 1000 MG/10ML IJ SOLN
500.0000 mg | Freq: Four times a day (QID) | INTRAVENOUS | Status: DC | PRN
  Filled 2016-03-21: qty 5

## 2016-03-21 MED ORDER — BUPIVACAINE-EPINEPHRINE (PF) 0.25% -1:200000 IJ SOLN
INTRAMUSCULAR | Status: AC
  Filled 2016-03-21: qty 30

## 2016-03-21 MED ORDER — LACTATED RINGERS IV SOLN
INTRAVENOUS | Status: DC | PRN
  Administered 2016-03-21 (×3): via INTRAVENOUS

## 2016-03-21 MED ORDER — METOCLOPRAMIDE HCL 5 MG/ML IJ SOLN: 5.0000 mg | Freq: Three times a day (TID) | INTRAMUSCULAR | Status: DC | PRN

## 2016-03-21 MED ORDER — SODIUM CHLORIDE 0.9 % IJ SOLN
INTRAMUSCULAR | Status: DC | PRN
  Administered 2016-03-21: 50 mL

## 2016-03-21 MED ORDER — INSULIN ASPART 100 UNIT/ML ~~LOC~~ SOLN
0.0000 [IU] | Freq: Three times a day (TID) | SUBCUTANEOUS | Status: DC
  Administered 2016-03-21 – 2016-03-22 (×2): 3 [IU] via SUBCUTANEOUS

## 2016-03-21 MED ORDER — 0.9 % SODIUM CHLORIDE (POUR BTL) OPTIME
TOPICAL | Status: DC | PRN
  Administered 2016-03-21: 1000 mL

## 2016-03-21 MED ORDER — DEXAMETHASONE SODIUM PHOSPHATE 10 MG/ML IJ SOLN
10.0000 mg | Freq: Once | INTRAMUSCULAR | Status: AC
  Administered 2016-03-22: 10 mg via INTRAVENOUS
  Filled 2016-03-21: qty 1

## 2016-03-21 MED ORDER — SODIUM CHLORIDE 0.9 % IV SOLN
INTRAVENOUS | Status: DC
  Administered 2016-03-21 (×2): via INTRAVENOUS

## 2016-03-21 MED ORDER — PROPOFOL 10 MG/ML IV BOLUS
INTRAVENOUS | Status: DC | PRN
  Administered 2016-03-21 (×3): 10 mg via INTRAVENOUS

## 2016-03-21 MED ORDER — HYDROMORPHONE HCL 1 MG/ML IJ SOLN: 0.5000 mg | INTRAMUSCULAR | Status: DC | PRN

## 2016-03-21 MED ORDER — SODIUM CHLORIDE 0.9 % IJ SOLN
INTRAMUSCULAR | Status: AC
  Filled 2016-03-21: qty 50

## 2016-03-21 MED ORDER — METOCLOPRAMIDE HCL 5 MG PO TABS: 5.0000 mg | ORAL_TABLET | Freq: Three times a day (TID) | ORAL | Status: DC | PRN

## 2016-03-21 MED ORDER — PHENOL 1.4 % MT LIQD
1.0000 | OROMUCOSAL | Status: DC | PRN
Start: 2016-03-21 — End: 2016-03-22

## 2016-03-21 MED ORDER — MAGNESIUM CITRATE PO SOLN: 1.0000 | Freq: Once | ORAL | Status: DC | PRN

## 2016-03-21 MED ORDER — ONDANSETRON HCL 4 MG PO TABS: 4.0000 mg | ORAL_TABLET | Freq: Four times a day (QID) | ORAL | Status: DC | PRN

## 2016-03-21 MED ORDER — PHENYLEPHRINE 40 MCG/ML (10ML) SYRINGE FOR IV PUSH (FOR BLOOD PRESSURE SUPPORT)
PREFILLED_SYRINGE | INTRAVENOUS | Status: AC
Start: 2016-03-21 — End: 2016-03-21
  Filled 2016-03-21: qty 10

## 2016-03-21 MED ORDER — PROMETHAZINE HCL 25 MG/ML IJ SOLN: 6.2500 mg | INTRAMUSCULAR | Status: DC | PRN

## 2016-03-21 MED ORDER — BUPIVACAINE IN DEXTROSE 0.75-8.25 % IT SOLN
INTRATHECAL | Status: DC | PRN
  Administered 2016-03-21: 2 mL via INTRATHECAL

## 2016-03-21 MED ORDER — TRANEXAMIC ACID 1000 MG/10ML IV SOLN
2000.0000 mg | Freq: Once | INTRAVENOUS | Status: DC
  Filled 2016-03-21: qty 20

## 2016-03-21 MED ORDER — ONDANSETRON HCL 4 MG/2ML IJ SOLN
INTRAMUSCULAR | Status: AC
  Filled 2016-03-21: qty 2

## 2016-03-21 MED ORDER — MIRABEGRON ER 25 MG PO TB24
25.0000 mg | ORAL_TABLET | Freq: Every day | ORAL | Status: DC
  Administered 2016-03-21 – 2016-03-22 (×2): 25 mg via ORAL
  Filled 2016-03-21 (×2): qty 1

## 2016-03-21 MED ORDER — CEFAZOLIN SODIUM-DEXTROSE 2-4 GM/100ML-% IV SOLN
2.0000 g | Freq: Four times a day (QID) | INTRAVENOUS | Status: AC
Start: 2016-03-21 — End: 2016-03-21
  Administered 2016-03-21 (×2): 2 g via INTRAVENOUS
  Filled 2016-03-21 (×2): qty 100

## 2016-03-21 MED ORDER — ONDANSETRON HCL 4 MG/2ML IJ SOLN
INTRAMUSCULAR | Status: DC | PRN
  Administered 2016-03-21: 4 mg via INTRAVENOUS

## 2016-03-21 MED ORDER — FUROSEMIDE 20 MG PO TABS: 20.0000 mg | ORAL_TABLET | Freq: Every day | ORAL | Status: DC | PRN

## 2016-03-21 MED ORDER — MENTHOL 3 MG MT LOZG: 1.0000 | LOZENGE | OROMUCOSAL | Status: DC | PRN

## 2016-03-21 MED ORDER — PROPOFOL 10 MG/ML IV BOLUS
INTRAVENOUS | Status: AC
  Filled 2016-03-21: qty 60

## 2016-03-21 MED ORDER — CEFAZOLIN SODIUM-DEXTROSE 2-4 GM/100ML-% IV SOLN
2.0000 g | INTRAVENOUS | Status: AC
  Administered 2016-03-21: 2 g via INTRAVENOUS

## 2016-03-21 MED ORDER — EPHEDRINE SULFATE 50 MG/ML IJ SOLN
INTRAMUSCULAR | Status: AC
  Filled 2016-03-21: qty 1

## 2016-03-21 MED ORDER — PROPOFOL 500 MG/50ML IV EMUL
INTRAVENOUS | Status: DC | PRN
  Administered 2016-03-21: 50 ug/kg/min via INTRAVENOUS

## 2016-03-21 MED ORDER — CELECOXIB 200 MG PO CAPS
200.0000 mg | ORAL_CAPSULE | Freq: Two times a day (BID) | ORAL | Status: DC
  Administered 2016-03-21 – 2016-03-22 (×2): 200 mg via ORAL
  Filled 2016-03-21 (×2): qty 1

## 2016-03-21 MED ORDER — KETOROLAC TROMETHAMINE 30 MG/ML IJ SOLN
INTRAMUSCULAR | Status: AC
  Filled 2016-03-21: qty 1

## 2016-03-21 MED ORDER — FENTANYL CITRATE (PF) 100 MCG/2ML IJ SOLN
INTRAMUSCULAR | Status: AC
  Filled 2016-03-21: qty 2

## 2016-03-21 MED ORDER — PHENYLEPHRINE 40 MCG/ML (10ML) SYRINGE FOR IV PUSH (FOR BLOOD PRESSURE SUPPORT)
PREFILLED_SYRINGE | INTRAVENOUS | Status: AC
  Filled 2016-03-21: qty 10

## 2016-03-21 MED ORDER — METHOCARBAMOL 500 MG PO TABS: 500.0000 mg | ORAL_TABLET | Freq: Four times a day (QID) | ORAL | Status: DC | PRN

## 2016-03-21 MED ORDER — CEFAZOLIN SODIUM-DEXTROSE 2-4 GM/100ML-% IV SOLN
INTRAVENOUS | Status: AC
  Filled 2016-03-21: qty 100

## 2016-03-21 SURGICAL SUPPLY — 49 items
BAG DECANTER FOR FLEXI CONT (MISCELLANEOUS) IMPLANT
BAG SPEC THK2 15X12 ZIP CLS (MISCELLANEOUS)
BAG ZIPLOCK 12X15 (MISCELLANEOUS) IMPLANT
BANDAGE ACE 6X5 VEL STRL LF (GAUZE/BANDAGES/DRESSINGS) ×3 IMPLANT
BLADE SAW SGTL 13.0X1.19X90.0M (BLADE) ×3 IMPLANT
BONE CEMENT GENTAMICIN (Cement) ×6 IMPLANT
BOWL SMART MIX CTS (DISPOSABLE) ×3 IMPLANT
CAPT KNEE TOTAL 3 ATTUNE ×2 IMPLANT
CATH COUDE 5CC RIBBED (CATHETERS) IMPLANT
CATH RIBBED COUDE 5CC (CATHETERS) ×3
CEMENT BONE GENTAMICIN 40 (Cement) IMPLANT
CLOTH BEACON ORANGE TIMEOUT ST (SAFETY) ×3 IMPLANT
CUFF TOURN SGL QUICK 34 (TOURNIQUET CUFF) ×3
CUFF TRNQT CYL 34X4X40X1 (TOURNIQUET CUFF) ×1 IMPLANT
DECANTER SPIKE VIAL GLASS SM (MISCELLANEOUS) ×3 IMPLANT
DRAPE U-SHAPE 47X51 STRL (DRAPES) ×3 IMPLANT
DRESSING AQUACEL AG SP 3.5X10 (GAUZE/BANDAGES/DRESSINGS) ×1 IMPLANT
DRSG AQUACEL AG ADV 3.5X10 (GAUZE/BANDAGES/DRESSINGS) ×2 IMPLANT
DRSG AQUACEL AG SP 3.5X10 (GAUZE/BANDAGES/DRESSINGS) ×3
DURAPREP 26ML APPLICATOR (WOUND CARE) ×6 IMPLANT
ELECT REM PT RETURN 9FT ADLT (ELECTROSURGICAL) ×3
ELECTRODE REM PT RTRN 9FT ADLT (ELECTROSURGICAL) ×1 IMPLANT
GLOVE BIOGEL M 7.0 STRL (GLOVE) IMPLANT
GLOVE BIOGEL PI IND STRL 7.5 (GLOVE) ×1 IMPLANT
GLOVE BIOGEL PI IND STRL 8.5 (GLOVE) ×1 IMPLANT
GLOVE BIOGEL PI INDICATOR 7.5 (GLOVE) ×14
GLOVE BIOGEL PI INDICATOR 8.5 (GLOVE) ×2
GLOVE ECLIPSE 8.0 STRL XLNG CF (GLOVE) ×5 IMPLANT
GLOVE ORTHO TXT STRL SZ7.5 (GLOVE) ×4 IMPLANT
GOWN STRL REUS W/TWL LRG LVL3 (GOWN DISPOSABLE) ×5 IMPLANT
GOWN STRL REUS W/TWL XL LVL3 (GOWN DISPOSABLE) ×5 IMPLANT
HANDPIECE INTERPULSE COAX TIP (DISPOSABLE) ×3
LIQUID BAND (GAUZE/BANDAGES/DRESSINGS) ×3 IMPLANT
MANIFOLD NEPTUNE II (INSTRUMENTS) ×3 IMPLANT
PACK TOTAL KNEE CUSTOM (KITS) ×3 IMPLANT
POSITIONER SURGICAL ARM (MISCELLANEOUS) ×3 IMPLANT
SET HNDPC FAN SPRY TIP SCT (DISPOSABLE) ×1 IMPLANT
SET PAD KNEE POSITIONER (MISCELLANEOUS) ×3 IMPLANT
SUT MNCRL AB 4-0 PS2 18 (SUTURE) ×5 IMPLANT
SUT VIC AB 1 CT1 36 (SUTURE) ×3 IMPLANT
SUT VIC AB 2-0 CT1 27 (SUTURE) ×9
SUT VIC AB 2-0 CT1 TAPERPNT 27 (SUTURE) ×3 IMPLANT
SUT VLOC 180 0 24IN GS25 (SUTURE) ×3 IMPLANT
SYR 50ML LL SCALE MARK (SYRINGE) ×3 IMPLANT
TRAY FOLEY W/METER SILVER 14FR (SET/KITS/TRAYS/PACK) ×1 IMPLANT
TRAY FOLEY W/METER SILVER 16FR (SET/KITS/TRAYS/PACK) ×3 IMPLANT
WATER STERILE IRR 1500ML POUR (IV SOLUTION) ×3 IMPLANT
WRAP KNEE MAXI GEL POST OP (GAUZE/BANDAGES/DRESSINGS) ×3 IMPLANT
YANKAUER SUCT BULB TIP 10FT TU (MISCELLANEOUS) ×3 IMPLANT

## 2016-03-21 NOTE — Interval H&P Note (Signed)
History and Physical Interval Note:  03/21/2016 7:16 AM  Ricardo Mayer.  has presented today for surgery, with the diagnosis of right knee osteoathritis  The various methods of treatment have been discussed with the patient and family. After consideration of risks, benefits and other options for treatment, the patient has consented to  Procedure(s): RIGHT TOTAL KNEE ARTHROPLASTY (Right) as a surgical intervention .  The patient's history has been reviewed, patient examined, no change in status, stable for surgery.  I have reviewed the patient's chart and labs.  Questions were answered to the patient's satisfaction.     Mauri Pole

## 2016-03-21 NOTE — Anesthesia Procedure Notes (Signed)
Spinal  Patient location during procedure: OR Start time: 03/21/2016 7:25 AM End time: 03/21/2016 7:30 AM Staffing Anesthesiologist: Suella Broad D Resident/CRNA: Darlys Gales R Performed: resident/CRNA  Preanesthetic Checklist Completed: patient identified, site marked, surgical consent, pre-op evaluation, timeout performed, IV checked, risks and benefits discussed and monitors and equipment checked Spinal Block Patient position: sitting Prep: ChloraPrep Patient monitoring: heart rate, continuous pulse ox and blood pressure Approach: midline Location: L3-4 Injection technique: single-shot Needle Needle type: Spinocan  Needle gauge: 22 G Needle length: 9 cm Needle insertion depth: 7.5 cm Assessment Sensory level: T6 Additional Notes Expiration date of kit checked and confirmed. Patient tolerated procedure well, without complications.

## 2016-03-21 NOTE — Transfer of Care (Signed)
Immediate Anesthesia Transfer of Care Note  Patient: Ricardo Mayer.  Procedure(s) Performed: Procedure(s): RIGHT TOTAL KNEE ARTHROPLASTY (Right)  Patient Location: PACU  Anesthesia Type:Spinal  Level of Consciousness:  sedated, patient cooperative and responds to stimulation  Airway & Oxygen Therapy:Patient Spontanous Breathing and Patient connected to face mask oxgen  Post-op Assessment:  Report given to PACU RN and Post -op Vital signs reviewed and stable  Post vital signs:  Reviewed and stable  Last Vitals:  Vitals:   03/21/16 0532  BP: (!) 159/82  Pulse: 72  Resp: 18  Temp: [id number] C    Complications: No apparent anesthesia complications

## 2016-03-21 NOTE — Anesthesia Postprocedure Evaluation (Signed)
Anesthesia Post Note  Patient: Ricardo Mayer.  Procedure(s) Performed: Procedure(s) (LRB): RIGHT TOTAL KNEE ARTHROPLASTY (Right)  Patient location during evaluation: PACU Anesthesia Type: Spinal Level of consciousness: oriented and awake and alert Pain management: pain level controlled Vital Signs Assessment: post-procedure vital signs reviewed and stable Respiratory status: spontaneous breathing, respiratory function stable and patient connected to nasal cannula oxygen Cardiovascular status: blood pressure returned to baseline and stable Postop Assessment: no headache and no backache Anesthetic complications: no    Last Vitals:  Vitals:   03/21/16 1104 03/21/16 1209  BP: 122/64 135/71  Pulse: 61 67  Resp: 12 16  Temp: 36.8 C 36.5 C    Last Pain:  Vitals:   03/21/16 1209  TempSrc: Oral  PainSc:                  Effie Berkshire

## 2016-03-21 NOTE — Op Note (Signed)
NAME:  Ricardo Mayer.                      MEDICAL RECORD NO.:  [id number]                             FACILITY:  [facility]      PHYSICIAN:  Pietro Cassis. Alvan Dame, M.D.  DATE OF BIRTH:  1941-02-26      DATE OF PROCEDURE:  03/21/2016                                     OPERATIVE REPORT         PREOPERATIVE DIAGNOSIS:  Right knee osteoarthritis.      POSTOPERATIVE DIAGNOSIS:  Right knee osteoarthritis.      FINDINGS:  The patient was noted to have complete loss of cartilage and   bone-on-bone arthritis with associated osteophytes in all three compartments of   the knee with a significant synovitis and associated effusion.      PROCEDURE:  Right total knee replacement.      COMPONENTS USED:  DePuy Attune rotating platform posterior stabilized knee   system, a size 5 femur, 6 tibia, size 8 mm PS AOX insert, and 38 anatomic patellar   button.      SURGEON:  Pietro Cassis. Alvan Dame, M.D.      ASSISTANT:  Danae Orleans, PA-C.      ANESTHESIA:  Spinal.      SPECIMENS:  None.      COMPLICATION:  None.      DRAINS:  None.  EBL: <100cc      TOURNIQUET TIME:   Total Tourniquet Time Documented: Thigh (Right) - 36 minutes Total: Thigh (Right) - 36 minutes  .      The patient was stable to the recovery room.      INDICATION FOR PROCEDURE:  [name]. is a 75 y.o. male patient of   mine.  The patient had been seen, evaluated, and treated conservatively in the   office with medication, activity modification, and injections.  The patient had   radiographic changes of bone-on-bone arthritis with endplate sclerosis and osteophytes noted.      The patient failed conservative measures including medication, injections, and activity modification, and at this point was ready for more definitive measures.   Based on the radiographic changes and failed conservative measures, the patient   decided to proceed with total knee replacement.  Risks of infection,   DVT, component failure, need for  revision surgery, postop course, and   expectations were all   discussed and reviewed.  Consent was obtained for benefit of pain   relief.      PROCEDURE IN DETAIL:  The patient was brought to the operative theater.   Once adequate anesthesia, preoperative antibiotics, 2 gm of Ancef and 10mg  of Tranexamic Acid administered, the patient was positioned supine with the right thigh tourniquet placed.  The  right lower extremity was prepped and draped in sterile fashion.  A time-   out was performed identifying the patient, planned procedure, and   extremity.      The right lower extremity was placed in the [facility] leg holder.  The leg was   exsanguinated, tourniquet elevated to 250 mmHg.  A midline incision was   made followed by median parapatellar arthrotomy.  Following initial   exposure, attention was first directed to the patella.  Precut   measurement was noted to be 26 mm.  I resected down to 14 mm and used a   38 anatomic patellar button to restore patellar height as well as cover the cut   surface.      The lug holes were drilled and a metal shim was placed to protect the   patella from retractors and saw blades.      At this point, attention was now directed to the femur.  The femoral   canal was opened with a drill, irrigated to try to prevent fat emboli.  An   intramedullary rod was passed at 5 degrees valgus, 9 mm of bone was   resected off the distal femur.  Following this resection, the tibia was   subluxated anteriorly.  Using the extramedullary guide, 2 mm of bone was resected off   the proximal medial tibia.  We confirmed the gap would be   stable medially and laterally with a size 6 mm insert as well as confirmed   the cut was perpendicular in the coronal plane, checking with an alignment rod.      Once this was done, I sized the femur to be a size 5 in the anterior-   posterior dimension, chose a standard component based on medial and   lateral dimension.  The size 5  rotation block was then pinned in   position anterior referenced using the C-clamp to set rotation.  The   anterior, posterior, and  chamfer cuts were made without difficulty nor   notching making certain that I was along the anterior cortex to help   with flexion gap stability.      The final box cut was made off the lateral aspect of distal femur.      At this point, the tibia was sized to be a size 6, the size 6 tray was   then pinned in position through the medial third of the tubercle,   drilled, and keel punched.  Trial reduction was now carried with a 5 femur,  6 tibia, a size 6 then 8 mm insert, and the 38 anatomic patella botton.  The knee was brought to   extension, full extension with good flexion stability with the patella   tracking through the trochlea without application of pressure.  Given   all these findings the femoral lug holes were drilled and then the trial components removed.  Final components were   opened and cement was mixed.  The knee was irrigated with normal saline   solution and pulse lavage.  The synovial lining was   then injected with 30 cc of 0.25% Marcaine with epinephrine and 1 cc of Toradol plus 30 cc of NS for a total of 61 cc.      The knee was irrigated.  Final implants were then cemented onto clean and   dried cut surfaces of bone with the knee brought to extension with a size 8   mm trial insert.      Once the cement had fully cured, the excess cement was removed   throughout the knee.  I confirmed I was satisfied with the range of   motion and stability, and the final size 8 mm PS AOX insert was chosen.  It was   placed into the knee.      The tourniquet had been let down at 36 minutes.  No significant  hemostasis required.  The   extensor mechanism was then reapproximated using #1 Vicryl and #0 V-lock sutures with the knee   in flexion.  The   remaining wound was closed with 2-0 Vicryl and running 4-0 Monocryl.   The knee was cleaned,  dried, dressed sterilely using Dermabond and   Aquacel dressing.  The patient was then   brought to recovery room in stable condition, tolerating the procedure   well.   Please note that Physician Assistant, Danae Orleans, PA-C, was present for the entirety of the case, and was utilized for pre-operative positioning, peri-operative retractor management, general facilitation of the procedure.  He was also utilized for primary wound closure at the end of the case.              Pietro Cassis Alvan Dame, M.D.    03/21/2016 9:00 AM

## 2016-03-21 NOTE — Evaluation (Signed)
Physical Therapy Evaluation Patient Details Name: Ricardo Mayer. MRN: [id number] DOB: 1941/01/23 Today's Date: 03/21/2016   History of Present Illness  R TKA  Clinical Impression  Pt is s/p TKA resulting in the deficits listed below (see PT Problem List). Pt took several pivotal steps from bed to recliner, distance limited by dizziness. Initiated TKA exercises.  Pt will benefit from skilled PT to increase their independence and safety with mobility to allow discharge to the venue listed below.      Follow Up Recommendations Outpatient PT    Equipment Recommendations  Rolling walker with 5" wheels;3in1 (PT)    Recommendations for Other Services OT consult     Precautions / Restrictions Precautions Precautions: Knee Restrictions Weight Bearing Restrictions: No Other Position/Activity Restrictions: WBAT      Mobility  Bed Mobility Overal bed mobility: Needs Assistance Bed Mobility: Supine to Sit     Supine to sit: Min assist;HOB elevated     General bed mobility comments: min A for supporting RLE  Transfers Overall transfer level: Needs assistance Equipment used: Rolling walker (2 wheeled) Transfers: Sit to/from Stand Sit to Stand: Min assist;From elevated surface         General transfer comment: min A to rise/steady, VCs hand placement  Ambulation/Gait Ambulation/Gait assistance: +2 safety/equipment;Min assist Ambulation Distance (Feet): 3 Feet Assistive device: Rolling walker (2 wheeled) Gait Pattern/deviations: Step-to pattern;Antalgic;Decreased step length - right;Decreased step length - left     General Gait Details: several pivotal steps to recliner, distance limited by dizziness (resolved after sitting reclined for 2 min), VCs sequencing  Stairs            Wheelchair Mobility    Modified Rankin (Stroke Patients Only)       Balance Overall balance assessment: Needs assistance   Sitting balance-Leahy Scale: Good     Standing balance  support: Bilateral upper extremity supported Standing balance-Leahy Scale: Poor Standing balance comment: limited by pain in R knee with WB                             Pertinent Vitals/Pain Pain Assessment: 0-10 Pain Score: 7  Pain Location: R knee with flexion Pain Descriptors / Indicators: Sore Pain Intervention(s): Monitored during session;Patient requesting pain meds-RN notified;Limited activity within patient's tolerance;Ice applied    Home Living Family/patient expects to be discharged to:: Private residence Living Arrangements: Spouse/significant other Available Help at Discharge: Family;Available 24 hours/day Type of Home: House Home Access: Stairs to enter Entrance Stairs-Rails: None Entrance Stairs-Number of Steps: 1 Home Layout: Two level;Able to live on main level with bedroom/bathroom Home Equipment: Kasandra Knudsen - single point;Walker - standard;Other (comment) (ice pump)      Prior Function Level of Independence: Independent               Hand Dominance        Extremity/Trunk Assessment   Upper Extremity Assessment: Overall WFL for tasks assessed           Lower Extremity Assessment: RLE deficits/detail RLE Deficits / Details: 5-50* AAROM R knee, 3/5 SLR, ankle WNL       Communication   Communication: No difficulties  Cognition Arousal/Alertness: Awake/alert Behavior During Therapy: WFL for tasks assessed/performed Overall Cognitive Status: Within Functional Limits for tasks assessed                      General Comments      Exercises Total  Joint Exercises Ankle Circles/Pumps: AROM;Both;10 reps Quad Sets: AROM;Both;5 reps;Supine Heel Slides: AAROM;Right;10 reps;Supine      Assessment/Plan    PT Assessment Patient needs continued PT services  PT Diagnosis Difficulty walking;Acute pain   PT Problem List Decreased strength;Decreased range of motion;Decreased activity tolerance;Decreased mobility;Pain;Decreased knowledge  of use of DME  PT Treatment Interventions Gait training;DME instruction;Stair training;Functional mobility training;Therapeutic exercise;Therapeutic activities;Patient/family education   PT Goals (Current goals can be found in the Care Plan section) Acute Rehab PT Goals Patient Stated Goal: walk independently PT Goal Formulation: With patient/family Time For Goal Achievement: 03/28/16 Potential to Achieve Goals: Good    Frequency 7X/week   Barriers to discharge        Co-evaluation               End of Session Equipment Utilized During Treatment: Gait belt Activity Tolerance: Treatment limited secondary to medical complications (Comment) (dizziness) Patient left: in chair;with call bell/phone within reach;with family/visitor present Nurse Communication: Mobility status         Time: [id number] PT Time Calculation (min) (ACUTE ONLY): 28 min   Charges:   PT Evaluation $PT Eval Low Complexity: 1 Procedure PT Treatments $Gait Training: 8-22 mins   PT G Codes:        Philomena Doheny 03/21/2016, 3:47 PM  [phone]

## 2016-03-21 NOTE — Discharge Instructions (Signed)

## 2016-03-22 LAB — BASIC METABOLIC PANEL
Anion gap: 7 (ref 5–15)
BUN: 29 mg/dL — AB (ref 6–20)
CHLORIDE: 109 mmol/L (ref 101–111)
CO2: 20 mmol/L — AB (ref 22–32)
CREATININE: 1.66 mg/dL — AB (ref 0.61–1.24)
Calcium: 8.2 mg/dL — ABNORMAL LOW (ref 8.9–10.3)
GFR calc Af Amer: 45 mL/min — ABNORMAL LOW (ref >60)
GFR calc non Af Amer: 39 mL/min — ABNORMAL LOW (ref >60)
GLUCOSE: 120 mg/dL — AB (ref 65–99)
Potassium: 4.7 mmol/L (ref 3.5–5.1)
SODIUM: 136 mmol/L (ref 135–145)

## 2016-03-22 LAB — CBC
HCT: 31.9 % — ABNORMAL LOW (ref 39.0–52.0)
HEMOGLOBIN: 10.9 g/dL — AB (ref 13.0–17.0)
MCH: 28.3 pg (ref 26.0–34.0)
MCHC: 34.2 g/dL (ref 30.0–36.0)
MCV: 82.9 fL (ref 78.0–100.0)
Platelets: 155 10*3/uL (ref 150–400)
RBC: 3.85 MIL/uL — ABNORMAL LOW (ref 4.22–5.81)
RDW: 14.1 % (ref 11.5–15.5)
WBC: 15.7 10*3/uL — ABNORMAL HIGH (ref 4.0–10.5)

## 2016-03-22 LAB — GLUCOSE, CAPILLARY
Glucose-Capillary: 120 mg/dL — ABNORMAL HIGH (ref 65–99)
Glucose-Capillary: 173 mg/dL — ABNORMAL HIGH (ref 65–99)

## 2016-03-22 MED ORDER — FERROUS SULFATE 325 (65 FE) MG PO TABS: 325.0000 mg | ORAL_TABLET | Freq: Three times a day (TID) | ORAL | 3 refills | Status: DC

## 2016-03-22 MED ORDER — DOCUSATE SODIUM 100 MG PO CAPS: 100.0000 mg | ORAL_CAPSULE | Freq: Two times a day (BID) | ORAL | 0 refills | Status: DC

## 2016-03-22 MED ORDER — DOXYCYCLINE HYCLATE 100 MG PO CAPS: 100.0000 mg | ORAL_CAPSULE | Freq: Two times a day (BID) | ORAL | 0 refills | Status: DC

## 2016-03-22 MED ORDER — TIZANIDINE HCL 4 MG PO TABS: 4.0000 mg | ORAL_TABLET | Freq: Four times a day (QID) | ORAL | 0 refills | Status: DC | PRN

## 2016-03-22 MED ORDER — HYDROCODONE-ACETAMINOPHEN 7.5-325 MG PO TABS: 1.0000 | ORAL_TABLET | ORAL | 0 refills | Status: DC | PRN

## 2016-03-22 MED ORDER — POLYETHYLENE GLYCOL 3350 17 G PO PACK: 17.0000 g | PACK | Freq: Two times a day (BID) | ORAL | 0 refills | Status: DC

## 2016-03-22 NOTE — Progress Notes (Signed)
Physical Therapy Treatment Patient Details Name: Ricardo Mayer. MRN: [id number] DOB: October 15, 1940 Today's Date: 03/22/2016    History of Present Illness R TKA    PT Comments    POD # 1 pm session Assisted with amb a greater distance in hallway then practiced one step twice with family present.   All mobility questions addressed.  Pt ready for D/C to home.   Follow Up Recommendations  Outpatient PT     Equipment Recommendations  Rolling walker with 5" wheels;3in1 (PT)    Recommendations for Other Services       Precautions / Restrictions Precautions Precautions: Knee Restrictions Weight Bearing Restrictions: No Other Position/Activity Restrictions: WBAT    Mobility  Bed Mobility               General bed mobility comments: NT -- up in chair  Transfers Overall transfer level: Needs assistance Equipment used: Rolling walker (2 wheeled) Transfers: Sit to/from Stand Sit to Stand: Supervision;Min guard         General transfer comment: min A to rise/steady, VCs hand placement  Ambulation/Gait Ambulation/Gait assistance: Supervision;Min guard Ambulation Distance (Feet): 53 Feet Assistive device: Rolling walker (2 wheeled) Gait Pattern/deviations: Step-to pattern;Decreased stance time - right Gait velocity: decreased   General Gait Details: 25% VC's for equal WBing and proper walker to self distance   Stairs Stairs: Yes   Stair Management: No rails;Step to pattern;Forwards Number of Stairs: 1 General stair comments: 25% VC's on proper sequencing and safety.  Performed twice with family present.  Wheelchair Mobility    Modified Rankin (Stroke Patients Only)       Balance                                    Cognition Arousal/Alertness: Awake/alert Behavior During Therapy: WFL for tasks assessed/performed Overall Cognitive Status: Within Functional Limits for tasks assessed                      Exercises      General  Comments        Pertinent Vitals/Pain Pain Assessment: 0-10 Pain Score: 2  Pain Location: R knee Pain Descriptors / Indicators: Discomfort Pain Intervention(s): Monitored during session;Repositioned;Ice applied    Home Living Family/patient expects to be discharged to:: Private residence Living Arrangements: Spouse/significant other Available Help at Discharge: Family;Available 24 hours/day Type of Home: House Home Access: Stairs to enter Entrance Stairs-Rails: None Home Layout: Two level;Able to live on main level with bedroom/bathroom Home Equipment: Kasandra Knudsen - single point;Walker - standard;Other (comment) Additional Comments: may be able to borrow a shower chair from a friend    Prior Function Level of Independence: Independent          PT Goals (current goals can now be found in the care plan section) Acute Rehab PT Goals Patient Stated Goal: walk independently Progress towards PT goals: Progressing toward goals    Frequency  7X/week    PT Plan Current plan remains appropriate    Co-evaluation             End of Session Equipment Utilized During Treatment: Gait belt Activity Tolerance: Treatment limited secondary to medical complications (Comment) Patient left: in chair;with call bell/phone within reach;with family/visitor present     Time: [id number] PT Time Calculation (min) (ACUTE ONLY): 29 min  Charges:  $Gait Training: 8-22 mins $Therapeutic Activity: 8-22 mins  G Codes:      Rica Koyanagi  PTA [facility]  Acute  Rehab Pager      [phone]

## 2016-03-22 NOTE — Discharge Summary (Signed)
Physician Discharge Summary  Patient ID: Ricardo Mayer. MRN: [id number] DOB/AGE: 1940-10-01 75 y.o.  Admit date: 03/21/2016 Discharge date: 03/22/2016   Procedures:  Procedure(s) (LRB): RIGHT TOTAL KNEE ARTHROPLASTY (Right)  Attending Physician:  Dr. Paralee Cancel   Admission Diagnoses:   Right knee primary OA / pain  Discharge Diagnoses:  Principal Problem:   S/P right TKA Active Problems:   S/P knee replacement  Past Medical History:  Diagnosis Date  . Adenomatous polyp of colon   . Arthritis    bilateral knees  . CKD (chronic kidney disease), stage II    Dr. Patel,nephrology "stable last 3 yrs"  . Diabetes mellitus without complication ([place])    Dr. Laurance Flatten.PCP  . History of basal cell cancer   . History of DVT of lower extremity    2011--  LEFT LEG  . History of kidney stones   . History of melanoma excision    RIGHT ARM  . Hyperlipidemia   . Hypertension   . Impaired hearing    hearing aids bilateral  . Melanoma ([place]) 1990   arm and back -Melanoma, basal cell others- Dr. Edgardo Roys follows.  . Near syncope 08/25/2015   Dr. Roland Rack once"found blood clots in lung"  . Nocturia   . Prostate cancer [facility]) 2014   Dr. Cherylann Parr- "seed implant" '14  . Ulcer of left lower leg ([place])    recent pcp visit dr Laurance Flatten--  ordered betadine wet/dry dsg daily (03-18-2013)  CONSULT WITH WOUND CARE CENTER IN 1 WEEK    HPI:    [name]., 75 y.o. male, has a history of pain and functional disability in the right knee due to arthritis and has failed non-surgical conservative treatments for greater than 12 weeks to include NSAID's and/or analgesics, corticosteriod injections, viscosupplementation injections, use of assistive devices and activity modification.  Onset of symptoms was gradual, starting 10-15 years ago with gradually worsening course since that time. The patient noted no past surgery on the right knee(s).  Patient currently rates pain in the right knee(s) at 9  out of 10 with activity. Patient has night pain, worsening of pain with activity and weight bearing, pain that interferes with activities of daily living, pain with passive range of motion, crepitus and joint swelling.  Patient has evidence of periarticular osteophytes and joint space narrowing by imaging studies.  There is no active infection.   Risks, benefits and expectations were discussed with the patient.  Risks including but not limited to the risk of anesthesia, blood clots, nerve damage, blood vessel damage, failure of the prosthesis, infection and up to and including death.  Patient understand the risks, benefits and expectations and wishes to proceed with surgery.   PCP: [name], MD   Discharged Condition: good  Hospital Course:  Patient underwent the above stated procedure on 03/21/2016. Patient tolerated the procedure well and brought to the recovery room in good condition and subsequently to the floor.  POD #1 BP: 119/60 ; Pulse: 64 ; Temp: 98.2 F (36.8 C) ; Resp: 16 Patient reports pain as mild to moderate with movement of his right knee. Otherwise no events. Neurovascular intact and incision: dressing C/D/I  LABS  Basename    HGB     10.9  HCT     31.9    Discharge Exam: General appearance: alert, cooperative and no distress Extremities: Homans sign is negative, no sign of DVT, no edema, redness or tenderness in the calves or thighs and no  ulcers, gangrene or trophic changes  Disposition: Home with follow up in 2 weeks   Follow-up Information    Mauri Pole, MD. Schedule an appointment as soon as possible for a visit in 2 week(s).   Specialty:  Orthopedic Surgery Contact information: [address] [place] [address] [phone]        Inc[facility] - [facility] .   Why:  rolling walker and 3n1 Contact information: [address] [place] [address] [phone]           Discharge Instructions    Call MD / Call 911     Complete by:  As directed   If you experience chest pain or shortness of breath, CALL 911 and be transported to the hospital emergency room.  If you develope a fever above 101 F, pus (white drainage) or increased drainage or redness at the wound, or calf pain, call your surgeon's office.   Change dressing    Complete by:  As directed   Maintain surgical dressing until follow up in the clinic. If the edges start to pull up, may reinforce with tape. If the dressing is no longer working, may remove and cover with gauze and tape, but must keep the area dry and clean.  Call with any questions or concerns.   Constipation Prevention    Complete by:  As directed   Drink plenty of fluids.  Prune juice may be helpful.  You may use a stool softener, such as Colace (over the counter) 100 mg twice a day.  Use MiraLax (over the counter) for constipation as needed.   Diet - low sodium heart healthy    Complete by:  As directed   Discharge instructions    Complete by:  As directed   Maintain surgical dressing until follow up in the clinic. If the edges start to pull up, may reinforce with tape. If the dressing is no longer working, may remove and cover with gauze and tape, but must keep the area dry and clean.  Follow up in 2 weeks at [facility]. Call with any questions or concerns.   Increase activity slowly as tolerated    Complete by:  As directed   Weight bearing as tolerated with assist device (walker, cane, etc) as directed, use it as long as suggested by your surgeon or therapist, typically at least 4-6 weeks.   TED hose    Complete by:  As directed   Use stockings (TED hose) for 2 weeks on both leg(s).  You may remove them at night for sleeping.        Medication List    STOP taking these medications   acetaminophen 500 MG tablet Commonly known as:  TYLENOL     TAKE these medications   amLODipine-valsartan 10-320 MG tablet Commonly known as:  EXFORGE TAKE 1 TABLET BY MOUTH DAILY.    docusate sodium 100 MG capsule Commonly known as:  COLACE Take 1 capsule (100 mg total) by mouth 2 (two) times daily.   doxycycline 100 MG capsule Commonly known as:  VIBRAMYCIN Take 1 capsule (100 mg total) by mouth 2 (two) times daily.   fenofibrate 160 MG tablet TAKE 1 TABLET BY MOUTH DAILY What changed:  See the new instructions.   ferrous sulfate 325 (65 FE) MG tablet Take 1 tablet (325 mg total) by mouth 3 (three) times daily after meals.   furosemide 20 MG tablet Commonly known as:  LASIX Take 1 tablet (  20 mg total) by mouth daily. What changed:  when to take this  reasons to take this   glucose blood test strip Commonly known as:  ONE TOUCH ULTRA TEST USE FOR TESTING SUGAR 2 to 3 TIMES DAILY E11.9   HYDROcodone-acetaminophen 7.5-325 MG tablet Commonly known as:  NORCO Take 1-2 tablets by mouth every 4 (four) hours as needed for moderate pain.   MYRBETRIQ 25 MG Tb24 tablet Generic drug:  mirabegron ER Take 25 mg by mouth daily. Reported on 08/26/2015   polyethylene glycol packet Commonly known as:  MIRALAX / GLYCOLAX Take 17 g by mouth 2 (two) times daily.   rivaroxaban 20 MG Tabs tablet Commonly known as:  XARELTO Take one tablet by mouth daily with food What changed:  how much to take  how to take this  when to take this  additional instructions   sitaGLIPtin 50 MG tablet Commonly known as:  JANUVIA TAKE 1 TABLET (50 MG TOTAL) BY MOUTH DAILY.   tiZANidine 4 MG tablet Commonly known as:  ZANAFLEX Take 1 tablet (4 mg total) by mouth every 6 (six) hours as needed for muscle spasms.   vitamin B-12 1000 MCG tablet Commonly known as:  CYANOCOBALAMIN Take 1,000 mcg by mouth daily.        Signed: [place]. Shatha Hooser   PA-C  03/22/2016, 3:46 PM

## 2016-03-22 NOTE — Progress Notes (Signed)
Occupational Therapy Evaluation Patient Details Name: Ricardo Mayer. MRN: [id number] DOB: 02-27-1941 Today's Date: 03/22/2016    History of Present Illness R TKA   Clinical Impression   All OT education completed and pt questions answered. No further OT needs at this time. Will sign off.    Follow Up Recommendations  No OT follow up;Supervision/Assistance - 24 hour    Equipment Recommendations  3 in 1 bedside comode    Recommendations for Other Services       Precautions / Restrictions Precautions Precautions: Knee Restrictions Weight Bearing Restrictions: No Other Position/Activity Restrictions: WBAT      Mobility Bed Mobility               General bed mobility comments: NT -- up in chair  Transfers Overall transfer level: Needs assistance Equipment used: Rolling walker (2 wheeled) Transfers: Sit to/from Stand Sit to Stand: Supervision;Min guard              Balance                                            ADL Overall ADL's : Needs assistance/impaired Eating/Feeding: Independent;Sitting   Grooming: Wash/dry hands;Wash/dry face;Set up;Sitting           Upper Body Dressing : Set up;Sitting   Lower Body Dressing: Minimal assistance;Sit to/from stand   Toilet Transfer: Supervision/safety;Min guard;Ambulation;RW;BSC   Toileting- Clothing Manipulation and Hygiene: Supervision/safety;Min guard;Sit to/from Nurse, children's Details (indicate cue type and reason): verbal education and demo but pt declined to practice Functional mobility during ADLs: Supervision/safety;Min guard;Rolling walker General ADL Comments: Patient educated on LB dressing techniques and shower transfer during evaluation. He was educated to use 3 in 1 as shower seat. Wife and patient in agreement. Educated that she would have to move 3 in 1 back and forth for safety reasons. Patient practiced toileting, no difficulty with toilet transfer  with 3 in 1, ambulating well in room. No further OT needs at this time.     Vision     Perception     Praxis      Pertinent Vitals/Pain Pain Assessment: 0-10 Pain Score: 1  Pain Location: R knee Pain Descriptors / Indicators: Sore Pain Intervention(s): Monitored during session;Repositioned;Limited activity within patient's tolerance     Hand Dominance     Extremity/Trunk Assessment Upper Extremity Assessment Upper Extremity Assessment: Overall WFL for tasks assessed   Lower Extremity Assessment Lower Extremity Assessment: Defer to PT evaluation       Communication Communication Communication: No difficulties   Cognition Arousal/Alertness: Awake/alert Behavior During Therapy: WFL for tasks assessed/performed Overall Cognitive Status: Within Functional Limits for tasks assessed                     General Comments       Exercises       Shoulder Instructions      Home Living Family/patient expects to be discharged to:: Private residence Living Arrangements: Spouse/significant other Available Help at Discharge: Family;Available 24 hours/day Type of Home: House Home Access: Stairs to enter [employer] of Steps: 1 Entrance Stairs-Rails: None Home Layout: Two level;Able to live on main level with bedroom/bathroom Alternate Level Stairs-Number of Steps: spiral stairway, plans to stay downstairs   Bathroom Shower/Tub: Occupational psychologist: Standard     Home  Equipment: Cane - single point;Walker - standard;Other (comment)   Additional Comments: may be able to borrow a shower chair from a friend      Prior Functioning/Environment Level of Independence: Independent             OT Diagnosis: Acute pain   OT Problem List: Decreased strength;Decreased range of motion;Decreased knowledge of use of DME or AE;Pain   OT Treatment/Interventions:      OT Goals(Current goals can be found in the care plan section) Acute Rehab OT  Goals Patient Stated Goal: walk independently OT Goal Formulation: All assessment and education complete, DC therapy  OT Frequency:     Barriers to D/C:            Co-evaluation              End of Session Equipment Utilized During Treatment: Rolling walker Nurse Communication: Other (comment) (asked nurse if foley coming out soon)  Activity Tolerance: Patient tolerated treatment well Patient left: in chair;with call bell/phone within reach;with family/visitor present   Time: 1043-1100 OT Time Calculation (min): 17 min Charges:  OT General Charges $OT Visit: 1 Procedure OT Evaluation $OT Eval Low Complexity: 1 Procedure G-Codes:    Ricardo Mayer A Mar 27, 2016, 12:42 PM

## 2016-03-22 NOTE — Progress Notes (Addendum)
Patient ID: Ricardo Mayer., male   DOB: August 28, 1940, 75 y.o.   MRN: [id number] Subjective: 1 Day Post-Op Procedure(s) (LRB): RIGHT TOTAL KNEE ARTHROPLASTY (Right)    Patient reports pain as mild to moderate with movement of his right knee. Otherwise no events.  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   Assessment/Plan: 1 Day Post-Op Procedure(s) (LRB): RIGHT TOTAL KNEE ARTHROPLASTY (Right)   Advance diet Up with therapy Discharge home with home health later today after therapy  RTC in 2 weeks, reviewed goals  Will place on doxycycline 100mg  BID for 4 weeks as prophylaxis due to significant venous stasis changes to his lower extremities

## 2016-03-22 NOTE — Care Management Note (Signed)
Case Management Note  Patient Details  Name: Ricardo Mayer. MRN: [id number] Date of Birth: November 14, 1940  Subjective/Objective:                  RIGHT TOTAL KNEE ARTHROPLASTY (Right) Action/Plan: Discharge planning Expected Discharge Date:03/22/16                  Expected Discharge Plan:  Home/Self Care  In-House Referral:     Discharge planning Services  CM Consult  Post Acute Care Choice:  NA Choice offered to:  Patient  DME Arranged:  3-N-1, Walker rolling DME Agency:  [place]:  NA [place] Agency:  NA  Status of Service:  Completed, signed off  If discussed at [place] of Stay Meetings, dates discussed:    Additional Comments: CM met with pt in room to confirm plan is for outpt PT in [place]; pt confirms.  CM called [facility] DME rep, Jermaine to please deliver the 3n1 and rolling walker so pt can discharge.  No other CM needs were communicated. Dellie Catholic, RN 03/22/2016, 12:10 PM

## 2016-03-22 NOTE — Progress Notes (Signed)
Physical Therapy Treatment Patient Details Name: Ricardo Mayer. MRN: [id number] DOB: 16-Jan-1941 Today's Date: 03/22/2016    History of Present Illness R TKA    PT Comments    POD # 1 am session Assisted with amb in hallway then performing TKR TE's following HEP handout.  Instrycted on proper tech as well as use of ICE.  Will need another PT session to address stairs and complete HEP as OP is scheduled to start 2 days after D/C.   Follow Up Recommendations  Outpatient PT     Equipment Recommendations  Rolling walker with 5" wheels;3in1 (PT)    Recommendations for Other Services       Precautions / Restrictions Precautions Precautions: Knee Restrictions Weight Bearing Restrictions: No Other Position/Activity Restrictions: WBAT    Mobility  Bed Mobility               General bed mobility comments: NT -- up in chair  Transfers Overall transfer level: Needs assistance Equipment used: Rolling walker (2 wheeled) Transfers: Sit to/from Stand Sit to Stand: Supervision;Min guard         General transfer comment: min A to rise/steady, VCs hand placement  Ambulation/Gait Ambulation/Gait assistance: Supervision;Min guard Ambulation Distance (Feet): 42 Feet Assistive device: Rolling walker (2 wheeled) Gait Pattern/deviations: Step-to pattern;Decreased stance time - right Gait velocity: decreased   General Gait Details: 25% VC's for equal WBing and proper walker to self distance   Stairs            Wheelchair Mobility    Modified Rankin (Stroke Patients Only)       Balance                                    Cognition Arousal/Alertness: Awake/alert Behavior During Therapy: WFL for tasks assessed/performed Overall Cognitive Status: Within Functional Limits for tasks assessed                      Exercises   Total Knee Replacement TE's 10 reps B LE ankle pumps 10 reps towel squeezes 10 reps knee presses 10 reps heel  slides  10 reps SAQ's  Followed by ICE     General Comments        Pertinent Vitals/Pain Pain Assessment: 0-10 Pain Score: 2  Pain Location: R knee Pain Descriptors / Indicators: Discomfort Pain Intervention(s): Monitored during session;Repositioned;Ice applied    Home Living Family/patient expects to be discharged to:: Private residence Living Arrangements: Spouse/significant other Available Help at Discharge: Family;Available 24 hours/day Type of Home: House Home Access: Stairs to enter Entrance Stairs-Rails: None Home Layout: Two level;Able to live on main level with bedroom/bathroom Home Equipment: Kasandra Knudsen - single point;Walker - standard;Other (comment) Additional Comments: may be able to borrow a shower chair from a friend    Prior Function Level of Independence: Independent          PT Goals (current goals can now be found in the care plan section) Acute Rehab PT Goals Patient Stated Goal: walk independently Progress towards PT goals: Progressing toward goals    Frequency  7X/week    PT Plan Current plan remains appropriate    Co-evaluation             End of Session Equipment Utilized During Treatment: Gait belt Activity Tolerance: Treatment limited secondary to medical complications (Comment) Patient left: in chair;with call bell/phone within reach;with family/visitor present  Time: [id number] PT Time Calculation (min) (ACUTE ONLY): 43 min  Charges:  $Gait Training: 8-22 mins $Therapeutic Exercise: 8-22 mins $Therapeutic Activity: 8-22 mins                    G Codes:      Rica Koyanagi  PTA [facility]  Acute  Rehab Pager      [phone]

## 2016-03-24 ENCOUNTER — Ambulatory Visit: Payer: Medicare Other | Attending: Orthopedic Surgery | Admitting: Physical Therapy

## 2016-03-24 DIAGNOSIS — M25661 Stiffness of right knee, not elsewhere classified: Secondary | ICD-10-CM | POA: Diagnosis not present

## 2016-03-24 DIAGNOSIS — M25561 Pain in right knee: Secondary | ICD-10-CM

## 2016-03-24 DIAGNOSIS — R2689 Other abnormalities of gait and mobility: Secondary | ICD-10-CM | POA: Insufficient documentation

## 2016-03-24 NOTE — Patient Instructions (Signed)
Quad Set    With other leg bent, foot flat, slowly tighten muscles on thigh of straight leg while counting out loud to _5___.  Repeat _10-15___ times. Do _several___ sessions per day.  http://gt2.exer.us/275   Copyright  VHI. All rights reserved.   Straight Leg Raise    Slowly raise locked right leg _6-8___ inches from floor. Repeat ___10-15_ times per set. Do __1__ sets per session. Do _several___ sessions per day.  http://orth.exer.us/1102   Copyright  VHI. All rights reserved.    Heel Slide    Bend knee and pull heel toward buttocks. Hold ____ seconds. Return. USE SHEET OR TOWEL TO HELP GET MORE BEND. Repeat _10-15___ times. Do __several__ sessions per day.  http://gt2.exer.us/371   Copyright  VHI. All rights reserved.

## 2016-03-24 NOTE — Therapy (Signed)
[facility] [place], [place], [address] Phone: [phone]   Fax:  [phone]  Physical Therapy Evaluation  Patient Details  Name: Ricardo Mayer. MRN: [id number] Date of Birth: 29-Nov-1940 Referring Provider: Paralee Cancel, MD  Encounter Date: 03/24/2016      PT End of Session - 03/24/16 1219    Visit Number 1   Number of Visits 18   Date for PT Re-Evaluation 05/05/16   PT Start Time 0950   PT Stop Time 1030   PT Time Calculation (min) 40 min   Activity Tolerance Patient tolerated treatment well   Behavior During Therapy [facility] for tasks assessed/performed      Past Medical History:  Diagnosis Date  . Adenomatous polyp of colon   . Arthritis    bilateral knees  . CKD (chronic kidney disease), stage II    Dr. Patel,nephrology "stable last 3 yrs"  . Diabetes mellitus without complication ([place])    Dr. Laurance Flatten.PCP  . History of basal cell cancer   . History of DVT of lower extremity    2011--  LEFT LEG  . History of kidney stones   . History of melanoma excision    RIGHT ARM  . Hyperlipidemia   . Hypertension   . Impaired hearing    hearing aids bilateral  . Melanoma ([place]) 1990   arm and back -Melanoma, basal cell others- Dr. Edgardo Roys follows.  . Near syncope 08/25/2015   Dr. Roland Rack once"found blood clots in lung"  . Nocturia   . Prostate cancer [facility]) 2014   Dr. Cherylann Parr- "seed implant" '14  . Ulcer of left lower leg ([place])    recent pcp visit dr Laurance Flatten--  ordered betadine wet/dry dsg daily (03-18-2013)  [place] IN 1 WEEK    Past Surgical History:  Procedure Laterality Date  . KIDNEY STONE EXTRACTIONS  LAST ONE 1970   X2 OPEN/  X1  URETEROSCOPY  . MELANOMA EXCISION RIGHT ARM  1990  . PROSTATE BIOPSY    . RADIOACTIVE SEED IMPLANT N/A 03/21/2013   Procedure: RADIOACTIVE SEED IMPLANT;  Surgeon: Malka So, MD;  Location: [facility];  Service: Urology;   Laterality: N/A;  87 seeds implanted    . TONSILLECTOMY    . TOTAL KNEE ARTHROPLASTY Right 03/21/2016   Procedure: RIGHT TOTAL KNEE ARTHROPLASTY;  Surgeon: Paralee Cancel, MD;  Location: [facility];  Service: Orthopedics;  Laterality: Right;    There were no vitals filed for this visit.       Subjective Assessment - 03/24/16 0952    Subjective Pt is a 75 y/o male who presents to OPPT s/p R TKA on 03/21/16.  Pt presents today with increased pain and difficulty with mobilizing.   Patient is accompained by: Family member   Pertinent History CKD, DM, hx basal cell cancer, HLD, HTN, DVT/PE   Limitations Standing;Walking   How long can you stand comfortably? 2-3 min   How long can you walk comfortably? 3-4 min   Patient Stated Goals improve motion and mobility, decrease pain   Currently in Pain? Yes   Pain Score 1   up to 3/10 with walking   Pain Location Knee   Pain Orientation Right   Pain Descriptors / Indicators Sharp;Constant   Pain Type Surgical pain   Pain Onset In the past 7 days   Aggravating Factors  touching/pressure to thigh, motion, walking   Pain Relieving Factors sitting down  [facility] PT Assessment - 03/24/16 0955      Assessment   Medical Diagnosis R TKA   Referring Provider Paralee Cancel, MD   Onset Date/Surgical Date --  03/21/16   Next MD Visit 04/08/16   Prior Therapy acute x 2 visits     Precautions   Precautions Fall     Restrictions   Weight Bearing Restrictions No     Balance Screen   Has the patient fallen in the past 6 months No   Has the patient had a decrease in activity level because of a fear of falling?  Yes   Is the patient reluctant to leave their home because of a fear of falling?  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  pitting edema noted in R thigh   Focus on Therapeutic Outcomes (FOTO)  1 (99% limited; predicted 56% limited)     Functional Tests   Functional tests Other     Other:   Other/ Comments ~10 degree extensor lag with SLR on RLE     AROM   AROM Assessment Site Knee   Right/Left Knee Right   Right Knee Extension 7   Right Knee Flexion 52     PROM   PROM Assessment Site Knee   Right/Left Knee Right   Right Knee Extension 3   Right Knee Flexion 80     Strength   Strength Assessment Site Hip;Knee;Ankle   Right Hip Flexion 3+/5   Right Hip Extension --  0   Right Hip ABduction 4/5   Right/Left Knee Right   Right Knee Flexion 3-/5   Right Knee Extension 3-/5   Right Ankle Dorsiflexion 3+/5     Palpation   Patella mobility unable to assess due to bandage over incision   Palpation comment tenderness R thigh with pitting edema noted     Ambulation/Gait   Ambulation Distance (Feet) 50 Feet   Assistive device Rolling walker   Gait Pattern Step-to pattern;Decreased hip/knee flexion - right;Decreased stance time - right;Decreased step length - left;Right circumduction;Antalgic                           PT Education - 03/24/16 1043    Education provided Yes   Education Details HEP, monitor R knee and call MD if increase in symptoms; call 9-1-1 if pt develops SOB and trouble breathing   Person(s) Educated Patient   Methods Explanation;Demonstration;Handout   Comprehension Verbalized understanding;Returned  demonstration          PT Short Term Goals - 03/24/16 1225      PT SHORT TERM GOAL #1   Title independent with HEP (04/14/16)   Time 3   Period Weeks   Status New  PT Long Term Goals - 03/24/16 1226      PT LONG TERM GOAL #1   Title improve R knee AROM 0-95 degrees for improved function (05/05/16)   Time 6   Period Weeks   Status New     PT LONG TERM GOAL #2   Title ambulate > 200' without device independently for improved function (05/05/16)   Time 6   Period Weeks   Status New     PT LONG TERM GOAL #3   Title report pain < 2/10 with ADLs for improved function (05/05/16)   Time 6   Period Weeks   Status New     PT LONG TERM GOAL #4   Title tolerate standing and walking > 10 min without increase in pain for improved function (05/05/16)   Time 6   Period Weeks   Status New               Plan - 03/24/16 1222    Clinical Impression Statement Pt is a 75 y/o male s/p R TKA.  Pt presents today with increased swelling, difficulty walking, decreased strength and ROM.  Pt will benefit from PT to address deficits listed.  Pt reports increased pain yesterday and inability to do HEP from hospital.  Today noted some swelling, mild redness and warmth around the knee.  Deferred vaso at this time due to clinical findings and hx of DVT and PE.  Pt reports pain is decreased today so recommended pt monitor symptoms and if he notices any changes to call MD.  Recommended he call 9-1-1 if SOB, dizziness/lightheadedness develops and pt and wife verbalized understanding.     Rehab Potential Good   PT Frequency 3x / week   PT Duration 6 weeks   PT Treatment/Interventions ADLs/Self Care Home Management;Cryotherapy;Electrical Stimulation;Functional mobility training;Stair training;Gait training;DME Instruction;Moist Heat;Therapeutic activities;Therapeutic exercise;Balance training;Neuromuscular re-education;Patient/family education;Passive range of motion;Scar mobilization;Manual  techniques;Vasopneumatic Device   PT Next Visit Plan review HEP, ROM, strengthening   Consulted and Agree with Plan of Care Patient      Patient will benefit from skilled therapeutic intervention in order to improve the following deficits and impairments:  Abnormal gait, Cardiopulmonary status limiting activity, Decreased range of motion, Difficulty walking, Pain, Decreased endurance, Decreased activity tolerance, Decreased skin integrity, Impaired flexibility, Decreased scar mobility, Decreased mobility, Decreased strength, Increased edema  Visit Diagnosis: Pain in right knee - Plan: PT plan of care cert/re-cert  Stiffness of right knee, not elsewhere classified - Plan: PT plan of care cert/re-cert  Other abnormalities of gait and mobility - Plan: PT plan of care cert/re-cert      G-Codes - [id number] 11-28-26    Functional Assessment Tool Used FOTO   Functional Limitation Mobility: Walking and moving around   Mobility: Walking and Moving Around Current Status [phone]) At least 80 percent but less than 100 percent impaired, limited or restricted   Mobility: Walking and Moving Around Goal Status [phone]) At least 40 percent but less than 60 percent impaired, limited or restricted       Problem List Patient Active Problem List   Diagnosis Date Noted  . 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 S/P knee replacement 03/21/2016  . Anemia associated with chronic renal failure 10/28/2015  . Hyperparathyroidism , secondary, non-renal ([place]) 10/28/2015  . Pulmonary emboli ([place]) 08/27/2015  . Near syncope 08/25/2015  . DOE (dyspnea on exertion) 06/02/2015  . Controlled type 2 diabetes mellitus ([place]) 06/02/2015  . Melanoma, history of 10/09/2013  . 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 Chronic kidney disease, stage III (moderate) 10/09/2013  . Venous (peripheral) insufficiency 10/09/2013  . Statin intolerance 10/09/2013  . Prostate cancer ([place]) 12/25/2012        Laureen Abrahams, PT, DPT 03/24/16 12:29 PM    [facility] [address] [place], [place], [address] Phone: [phone]   Fax:  [phone]  Name: Ricardo Mayer. MRN: [id number] Date of Birth: [birth year]

## 2016-03-25 ENCOUNTER — Other Ambulatory Visit: Payer: Self-pay | Admitting: *Deleted

## 2016-03-25 DIAGNOSIS — N289 Disorder of kidney and ureter, unspecified: Secondary | ICD-10-CM

## 2016-03-28 ENCOUNTER — Other Ambulatory Visit: Payer: Medicare Other

## 2016-03-28 ENCOUNTER — Ambulatory Visit: Payer: Medicare Other | Admitting: Physical Therapy

## 2016-03-28 DIAGNOSIS — M25561 Pain in right knee: Secondary | ICD-10-CM | POA: Diagnosis not present

## 2016-03-28 DIAGNOSIS — N289 Disorder of kidney and ureter, unspecified: Secondary | ICD-10-CM | POA: Diagnosis not present

## 2016-03-28 DIAGNOSIS — M25661 Stiffness of right knee, not elsewhere classified: Secondary | ICD-10-CM | POA: Diagnosis not present

## 2016-03-28 DIAGNOSIS — R2689 Other abnormalities of gait and mobility: Secondary | ICD-10-CM | POA: Diagnosis not present

## 2016-03-28 NOTE — Therapy (Signed)
[facility] [place], [place], [address] Phone: [phone]   Fax:  [phone]  Physical Therapy Treatment  Patient Details  Name: Ricardo Mayer. MRN: [id number] Date of Birth: 1941/06/08 Referring Provider: Paralee Cancel, MD  Encounter Date: 03/28/2016      PT End of Session - 03/28/16 1449    Visit Number 2   Number of Visits 18   Date for PT Re-Evaluation 05/05/16   PT Start Time 0145   PT Stop Time 0247   PT Time Calculation (min) 62 min   Equipment Utilized During Treatment Gait belt   Activity Tolerance Patient tolerated treatment well   Behavior During Therapy [facility] for tasks assessed/performed      Past Medical History:  Diagnosis Date  . Adenomatous polyp of colon   . Arthritis    bilateral knees  . CKD (chronic kidney disease), stage II    Dr. Patel,nephrology "stable last 3 yrs"  . Diabetes mellitus without complication ([place])    Dr. Laurance Flatten.PCP  . History of basal cell cancer   . History of DVT of lower extremity    2011--  LEFT LEG  . History of kidney stones   . History of melanoma excision    RIGHT ARM  . Hyperlipidemia   . Hypertension   . Impaired hearing    hearing aids bilateral  . Melanoma ([place]) 1990   arm and back -Melanoma, basal cell others- Dr. Edgardo Roys follows.  . Near syncope 08/25/2015   Dr. Roland Rack once"found blood clots in lung"  . Nocturia   . Prostate cancer [facility]) 2014   Dr. Cherylann Parr- "seed implant" '14  . Ulcer of left lower leg ([place])    recent pcp visit dr Laurance Flatten--  ordered betadine wet/dry dsg daily (03-18-2013)  [place] IN 1 WEEK    Past Surgical History:  Procedure Laterality Date  . KIDNEY STONE EXTRACTIONS  LAST ONE 1970   X2 OPEN/  X1  URETEROSCOPY  . MELANOMA EXCISION RIGHT ARM  1990  . PROSTATE BIOPSY    . RADIOACTIVE SEED IMPLANT N/A 03/21/2013   Procedure: RADIOACTIVE SEED IMPLANT;  Surgeon: Malka So, MD;  Location: [facility];  Service: Urology;  Laterality: N/A;  87 seeds implanted    . TONSILLECTOMY    . TOTAL KNEE ARTHROPLASTY Right 03/21/2016   Procedure: RIGHT TOTAL KNEE ARTHROPLASTY;  Surgeon: Paralee Cancel, MD;  Location: [facility];  Service: Orthopedics;  Laterality: Right;    There were no vitals filed for this visit.      Subjective Assessment - 03/28/16 1438    Subjective My pain level is about a 5/10 today.  I called Dr. Aurea Graff nurse today and she said to make sure I was getting up and walking with my walker.  The patient admits he has been sedentary.  The nurse also recommended he elevate his knee higher than the level of his heart.  He states he has been staying in his recliner.  The nurse also stated that his rise in pain is due to his nerve block wearing off and that he needs to take his pain medication.  I informed the patient that should he experience any increased pain, redness or heat in his right knee/calf to call the nurse again.                           [facility] Adult PT Treatment/Exercise -  03/28/16 0001      Exercises   Exercises Knee/Hip     Knee/Hip Exercises: Aerobic   Nustep Level 1 x 15 minutes.     Modalities   Modalities Cryotherapy     Cryotherapy   Number Minutes Cryotherapy 15 Minutes   Cryotherapy Location --  Right knee.   Type of Cryotherapy --  CP elevated.     Manual Therapy   Manual Therapy Passive ROM   Manual therapy comments Gentle right knee PROM into flexion and extension x 8 minutes.                  PT Short Term Goals - 03/24/16 1225      PT SHORT TERM GOAL #1   Title independent with HEP (04/14/16)   Time 3   Period Weeks   Status New           PT Long Term Goals - 03/24/16 1226      PT LONG TERM GOAL #1   Title improve R knee AROM 0-95 degrees for improved function (05/05/16)   Time 6   Period Weeks   Status New     PT LONG TERM GOAL #2   Title ambulate > 200' without device independently for  improved function (05/05/16)   Time 6   Period Weeks   Status New     PT LONG TERM GOAL #3   Title report pain < 2/10 with ADLs for improved function (05/05/16)   Time 6   Period Weeks   Status New     PT LONG TERM GOAL #4   Title tolerate standing and walking > 10 min without increase in pain for improved function (05/05/16)   Time 6   Period Weeks   Status New             Patient will benefit from skilled therapeutic intervention in order to improve the following deficits and impairments:     Visit Diagnosis: Pain in right knee  Stiffness of right knee, not elsewhere classified  Other abnormalities of gait and mobility     Problem List Patient Active Problem List   Diagnosis Date Noted  . 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 S/P knee replacement 03/21/2016  . Anemia associated with chronic renal failure 10/28/2015  . Hyperparathyroidism , secondary, non-renal ([place]) 10/28/2015  . Pulmonary emboli ([place]) 08/27/2015  . Near syncope 08/25/2015  . DOE (dyspnea on exertion) 06/02/2015  . Controlled type 2 diabetes mellitus ([place]) 06/02/2015  . Melanoma, history of 10/09/2013  . 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 Chronic kidney disease, stage III (moderate) 10/09/2013  . Venous (peripheral) insufficiency 10/09/2013  . Statin intolerance 10/09/2013  . Prostate cancer ([place]) 12/25/2012    Ricardo Mayer, Ricardo Mayer 03/28/2016, 2:50 PM  [facility] [address] [place], [place], [address] Phone: [phone]   Fax:  [phone]  Name: Ricardo Mayer. MRN: [id number] Date of Birth: Dec 11, 1940

## 2016-03-29 LAB — BMP8+EGFR
BUN/Creatinine Ratio: 19 (ref 10–24)
BUN: 33 mg/dL — ABNORMAL HIGH (ref 8–27)
CHLORIDE: 100 mmol/L (ref 96–106)
CO2: 23 mmol/L (ref 18–29)
Calcium: 8.9 mg/dL (ref 8.6–10.2)
Creatinine, Ser: 1.7 mg/dL — ABNORMAL HIGH (ref 0.76–1.27)
GFR calc Af Amer: 45 mL/min/{1.73_m2} — ABNORMAL LOW (ref >59)
GFR, EST NON AFRICAN AMERICAN: 39 mL/min/{1.73_m2} — AB (ref >59)
Glucose: 118 mg/dL — ABNORMAL HIGH (ref 65–99)
POTASSIUM: 4.8 mmol/L (ref 3.5–5.2)
SODIUM: 139 mmol/L (ref 134–144)

## 2016-03-30 ENCOUNTER — Ambulatory Visit: Payer: Medicare Other | Admitting: Physical Therapy

## 2016-03-30 DIAGNOSIS — M25661 Stiffness of right knee, not elsewhere classified: Secondary | ICD-10-CM

## 2016-03-30 DIAGNOSIS — M25561 Pain in right knee: Secondary | ICD-10-CM

## 2016-03-30 DIAGNOSIS — R2689 Other abnormalities of gait and mobility: Secondary | ICD-10-CM

## 2016-03-30 NOTE — Therapy (Signed)
[facility] [place], [place], [address] Phone: [phone]   Fax:  [phone]  Physical Therapy Treatment  Patient Details  Name: Ricardo Mayer. MRN: [id number] Date of Birth: 13-Jul-1941 Referring Provider: Paralee Cancel, MD  Encounter Date: 03/30/2016      PT End of Session - 03/30/16 1126    Visit Number 3   Number of Visits 18   Date for PT Re-Evaluation 05/05/16   PT Start Time 1030   PT Stop Time 1132   PT Time Calculation (min) 62 min   Activity Tolerance Patient tolerated treatment well   Behavior During Therapy [facility] for tasks assessed/performed      Past Medical History:  Diagnosis Date  . Adenomatous polyp of colon   . Arthritis    bilateral knees  . CKD (chronic kidney disease), stage II    Dr. Patel,nephrology "stable last 3 yrs"  . Diabetes mellitus without complication ([place])    Dr. Laurance Flatten.PCP  . History of basal cell cancer   . History of DVT of lower extremity    2011--  LEFT LEG  . History of kidney stones   . History of melanoma excision    RIGHT ARM  . Hyperlipidemia   . Hypertension   . Impaired hearing    hearing aids bilateral  . Melanoma ([place]) 1990   arm and back -Melanoma, basal cell others- Dr. Edgardo Roys follows.  . Near syncope 08/25/2015   Dr. Roland Rack once"found blood clots in lung"  . Nocturia   . Prostate cancer [facility]) 2014   Dr. Cherylann Parr- "seed implant" '14  . Ulcer of left lower leg ([place])    recent pcp visit dr Laurance Flatten--  ordered betadine wet/dry dsg daily (03-18-2013)  [place] IN 1 WEEK    Past Surgical History:  Procedure Laterality Date  . KIDNEY STONE EXTRACTIONS  LAST ONE 1970   X2 OPEN/  X1  URETEROSCOPY  . MELANOMA EXCISION RIGHT ARM  1990  . PROSTATE BIOPSY    . RADIOACTIVE SEED IMPLANT N/A 03/21/2013   Procedure: RADIOACTIVE SEED IMPLANT;  Surgeon: Malka So, MD;  Location: [facility];  Service: Urology;   Laterality: N/A;  87 seeds implanted    . TONSILLECTOMY    . TOTAL KNEE ARTHROPLASTY Right 03/21/2016   Procedure: RIGHT TOTAL KNEE ARTHROPLASTY;  Surgeon: Paralee Cancel, MD;  Location: [facility];  Service: Orthopedics;  Laterality: Right;    There were no vitals filed for this visit.      Subjective Assessment - 03/30/16 1127    Subjective I'm doing better today.  I took my pain medication rior to coming today so my pain is low.   Pain Score 1    Pain Location Knee   Pain Orientation Right   Pain Descriptors / Indicators Aching   Pain Type Surgical pain   Pain Onset In the past 7 days                         [facility] Adult PT Treatment/Exercise - 03/30/16 0001      Exercises   Exercises Knee/Hip     Knee/Hip Exercises: Aerobic   Nustep Level 1 x 22 minutes moving forward times 2 from seat 12 to seat 10.     Knee/Hip Exercises: Supine   Quad Sets Limitations 2# SAQ x 5 minutes f/b 2# overpressure stretch x 6 minutes.     Manual Therapy  Manual Therapy Passive ROM   Manual therapy comments Gentle right knee flexion x 5 minutes low load long duration stretching f/b CP x 15 minutes with right knee elevated.                  PT Short Term Goals - 03/24/16 1225      PT SHORT TERM GOAL #1   Title independent with HEP (04/14/16)   Time 3   Period Weeks   Status New           PT Long Term Goals - 03/24/16 1226      PT LONG TERM GOAL #1   Title improve R knee AROM 0-95 degrees for improved function (05/05/16)   Time 6   Period Weeks   Status New     PT LONG TERM GOAL #2   Title ambulate > 200' without device independently for improved function (05/05/16)   Time 6   Period Weeks   Status New     PT LONG TERM GOAL #3   Title report pain < 2/10 with ADLs for improved function (05/05/16)   Time 6   Period Weeks   Status New     PT LONG TERM GOAL #4   Title tolerate standing and walking > 10 min without increase in pain for improved function  (05/05/16)   Time 6   Period Weeks   Status New             Patient will benefit from skilled therapeutic intervention in order to improve the following deficits and impairments:  Abnormal gait, Cardiopulmonary status limiting activity, Decreased range of motion, Difficulty walking, Pain, Decreased endurance, Decreased activity tolerance, Decreased skin integrity, Impaired flexibility, Decreased scar mobility, Decreased mobility, Decreased strength, Increased edema  Visit Diagnosis: Pain in right knee  Stiffness of right knee, not elsewhere classified  Other abnormalities of gait and mobility     Problem List Patient Active Problem List   Diagnosis Date Noted  . 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 S/P knee replacement 03/21/2016  . Anemia associated with chronic renal failure 10/28/2015  . Hyperparathyroidism , secondary, non-renal ([place]) 10/28/2015  . Pulmonary emboli ([place]) 08/27/2015  . Near syncope 08/25/2015  . DOE (dyspnea on exertion) 06/02/2015  . Controlled type 2 diabetes mellitus ([place]) 06/02/2015  . Melanoma, history of 10/09/2013  . 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 Chronic kidney disease, stage III (moderate) 10/09/2013  . Venous (peripheral) insufficiency 10/09/2013  . Statin intolerance 10/09/2013  . Prostate cancer ([place]) 12/25/2012    Kaleigh Spiegelman, [place] MPT 03/30/2016, 11:38 AM  [facility] [address] [place], [place], [address] Phone: [phone]   Fax:  [phone]  Name: Ricardo Mayer. MRN: [id number] Date of Birth: 10/12/1940

## 2016-04-01 ENCOUNTER — Ambulatory Visit: Payer: Medicare Other | Admitting: *Deleted

## 2016-04-01 DIAGNOSIS — R2689 Other abnormalities of gait and mobility: Secondary | ICD-10-CM | POA: Diagnosis not present

## 2016-04-01 DIAGNOSIS — M25561 Pain in right knee: Secondary | ICD-10-CM | POA: Diagnosis not present

## 2016-04-01 DIAGNOSIS — M25661 Stiffness of right knee, not elsewhere classified: Secondary | ICD-10-CM | POA: Diagnosis not present

## 2016-04-01 NOTE — Therapy (Signed)
[facility] [place], [place], [address] Phone: [phone]   Fax:  [phone]  Physical Therapy Treatment  Patient Details  Name: Ricardo Mayer. MRN: [id number] Date of Birth: 02/07/1941 Referring Provider: Paralee Cancel, MD  Encounter Date: 04/01/2016      PT End of Session - 04/01/16 1235    Visit Number 4   Number of Visits 18   Date for PT Re-Evaluation 05/05/16   PT Start Time 1030   PT Stop Time 1130   PT Time Calculation (min) 60 min      Past Medical History:  Diagnosis Date  . Adenomatous polyp of colon   . Arthritis    bilateral knees  . CKD (chronic kidney disease), stage II    Dr. Patel,nephrology "stable last 3 yrs"  . Diabetes mellitus without complication ([place])    Dr. Laurance Flatten.PCP  . History of basal cell cancer   . History of DVT of lower extremity    2011--  LEFT LEG  . History of kidney stones   . History of melanoma excision    RIGHT ARM  . Hyperlipidemia   . Hypertension   . Impaired hearing    hearing aids bilateral  . Melanoma ([place]) 1990   arm and back -Melanoma, basal cell others- Dr. Edgardo Roys follows.  . Near syncope 08/25/2015   Dr. Roland Rack once"found blood clots in lung"  . Nocturia   . Prostate cancer [facility]) 2014   Dr. Cherylann Parr- "seed implant" '14  . Ulcer of left lower leg ([place])    recent pcp visit dr Laurance Flatten--  ordered betadine wet/dry dsg daily (03-18-2013)  [place] IN 1 WEEK    Past Surgical History:  Procedure Laterality Date  . KIDNEY STONE EXTRACTIONS  LAST ONE 1970   X2 OPEN/  X1  URETEROSCOPY  . MELANOMA EXCISION RIGHT ARM  1990  . PROSTATE BIOPSY    . RADIOACTIVE SEED IMPLANT N/A 03/21/2013   Procedure: RADIOACTIVE SEED IMPLANT;  Surgeon: Malka So, MD;  Location: [facility];  Service: Urology;  Laterality: N/A;  87 seeds implanted    . TONSILLECTOMY    . TOTAL KNEE ARTHROPLASTY Right 03/21/2016   Procedure: RIGHT  TOTAL KNEE ARTHROPLASTY;  Surgeon: Paralee Cancel, MD;  Location[facility];  Service: Orthopedics;  Laterality: Right;    There were no vitals filed for this visit.                       [facility] Adult PT Treatment/Exercise - 04/01/16 0001      Exercises   Exercises Knee/Hip     Knee/Hip Exercises: Aerobic   Nustep Level 2 x 20 minutes moving forward times 2 from seat 12 to seat 10.     Knee/Hip Exercises: Supine   Quad Sets Right;3 sets;10 reps   Heel Slides AROM;Right;AAROM;3 sets;10 reps   Straight Leg Raises AROM;AAROM;3 sets;10 reps     Modalities   Modalities Cryotherapy     Cryotherapy   Number Minutes Cryotherapy 15 Minutes   Cryotherapy Location Knee   Type of Cryotherapy Ice pack     Manual Therapy   Manual Therapy Passive ROM   Manual therapy comments Gentle right knee flexion low load long duration stretching    Passive ROM AAROM with SLR and heelslides                  PT Short Term Goals -  03/24/16 1225      PT SHORT TERM GOAL #1   Title independent with HEP (04/14/16)   Time 3   Period Weeks   Status New           PT Long Term Goals - 03/24/16 1226      PT LONG TERM GOAL #1   Title improve R knee AROM 0-95 degrees for improved function (05/05/16)   Time 6   Period Weeks   Status New     PT LONG TERM GOAL #2   Title ambulate > 200' without device independently for improved function (05/05/16)   Time 6   Period Weeks   Status New     PT LONG TERM GOAL #3   Title report pain < 2/10 with ADLs for improved function (05/05/16)   Time 6   Period Weeks   Status New     PT LONG TERM GOAL #4   Title tolerate standing and walking > 10 min without increase in pain for improved function (05/05/16)   Time 6   Period Weeks   Status New               Plan - 04/01/16 1107    Clinical Impression Statement Pt did fairly well today with Rx. He was able to perform Therex for RT LE, but needs assistance for some of the Exs due  to weakness. He still has post-surgical bandage intact and is ambulating with fWW.  No goals met today and are ongoing   Rehab Potential Good   PT Frequency 3x / week   PT Duration 6 weeks   PT Treatment/Interventions ADLs/Self Care Home Management;Cryotherapy;Electrical Stimulation;Functional mobility training;Stair training;Gait training;DME Instruction;Moist Heat;Therapeutic activities;Therapeutic exercise;Balance training;Neuromuscular re-education;Patient/family education;Passive range of motion;Scar mobilization;Manual techniques   PT Next Visit Plan No vasopneumatic.    RT TKR Rehab      Patient will benefit from skilled therapeutic intervention in order to improve the following deficits and impairments:  Abnormal gait, Cardiopulmonary status limiting activity, Decreased range of motion, Difficulty walking, Pain, Decreased endurance, Decreased activity tolerance, Decreased skin integrity, Impaired flexibility, Decreased scar mobility, Decreased mobility, Decreased strength, Increased edema  Visit Diagnosis: Pain in right knee  Stiffness of right knee, not elsewhere classified     Problem List Patient Active Problem List   Diagnosis Date Noted  . 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 S/P knee replacement 03/21/2016  . Anemia associated with chronic renal failure 10/28/2015  . Hyperparathyroidism , secondary, non-renal ([place]) 10/28/2015  . Pulmonary emboli ([place]) 08/27/2015  . Near syncope 08/25/2015  . DOE (dyspnea on exertion) 06/02/2015  . Controlled type 2 diabetes mellitus ([place]) 06/02/2015  . Melanoma, history of 10/09/2013  . 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 Chronic kidney disease, stage III (moderate) 10/09/2013  . Venous (peripheral) insufficiency 10/09/2013  . Statin intolerance 10/09/2013  . Prostate cancer [facility] [place]) 12/25/2012    RAMSEUR,CHRIS, PTA 04/01/2016, 12:43 PM  [facility] [address] [place], [place], [address] Phone: [phone]   Fax:  [phone]  Name: Ricardo Mayer. MRN: [id number] Date of Birth: November 28, 1940

## 2016-04-05 ENCOUNTER — Ambulatory Visit: Payer: Medicare Other | Admitting: *Deleted

## 2016-04-05 DIAGNOSIS — R2689 Other abnormalities of gait and mobility: Secondary | ICD-10-CM | POA: Diagnosis not present

## 2016-04-05 DIAGNOSIS — M25561 Pain in right knee: Secondary | ICD-10-CM

## 2016-04-05 DIAGNOSIS — M25661 Stiffness of right knee, not elsewhere classified: Secondary | ICD-10-CM | POA: Diagnosis not present

## 2016-04-05 NOTE — Therapy (Signed)
[facility] [place], [place], [address] Phone: [phone]   Fax:  [phone]  Physical Therapy Treatment  Patient Details  Name: Ricardo Mayer. MRN: [id number] Date of Birth: 1940/11/29 Referring Provider: Paralee Cancel, MD  Encounter Date: 04/05/2016      PT End of Session - 04/05/16 1404    Visit Number 5   Number of Visits 18   Date for PT Re-Evaluation 05/05/16   PT Start Time [id number]   PT Stop Time 1445   PT Time Calculation (min) 60 min      Past Medical History:  Diagnosis Date  . Adenomatous polyp of colon   . Arthritis    bilateral knees  . CKD (chronic kidney disease), stage II    Dr. Patel,nephrology "stable last 3 yrs"  . Diabetes mellitus without complication ([place])    Dr. Laurance Flatten.PCP  . History of basal cell cancer   . History of DVT of lower extremity    2011--  LEFT LEG  . History of kidney stones   . History of melanoma excision    RIGHT ARM  . Hyperlipidemia   . Hypertension   . Impaired hearing    hearing aids bilateral  . Melanoma ([place]) 1990   arm and back -Melanoma, basal cell others- Dr. Edgardo Roys follows.  . Near syncope 08/25/2015   Dr. Roland Rack once"found blood clots in lung"  . Nocturia   . Prostate cancer [facility]) 2014   Dr. Cherylann Parr- "seed implant" '14  . Ulcer of left lower leg ([place])    recent pcp visit dr Laurance Flatten--  ordered betadine wet/dry dsg daily (03-18-2013)  [place] IN 1 WEEK    Past Surgical History:  Procedure Laterality Date  . KIDNEY STONE EXTRACTIONS  LAST ONE 1970   X2 OPEN/  X1  URETEROSCOPY  . MELANOMA EXCISION RIGHT ARM  1990  . PROSTATE BIOPSY    . RADIOACTIVE SEED IMPLANT N/A 03/21/2013   Procedure: RADIOACTIVE SEED IMPLANT;  Surgeon: Malka So, MD;  Location: [facility];  Service: Urology;  Laterality: N/A;  87 seeds implanted    . TONSILLECTOMY    . TOTAL KNEE ARTHROPLASTY Right 03/21/2016   Procedure: RIGHT  TOTAL KNEE ARTHROPLASTY;  Surgeon: Paralee Cancel, MD;  [facility];  Service: Orthopedics;  Laterality: Right;    There were no vitals filed for this visit.      Subjective Assessment - 04/05/16 1428    Subjective I'm doing better today.  I took my pain medication rior to coming today so my pain is low. Still get light headed at times and stockings are to tight around my leg   Patient is accompained by: Family member   Pertinent History CKD, DM, hx basal cell cancer, HLD, HTN, DVT/PE   Limitations Standing;Walking   How long can you stand comfortably? 2-3 min   How long can you walk comfortably? 3-4 min   Patient Stated Goals improve motion and mobility, decrease pain   Currently in Pain? Yes   Pain Score 2    Pain Location Knee   Pain Orientation Right   Pain Descriptors / Indicators Aching   Pain Type Surgical pain   Pain Onset In the past 7 days   Pain Frequency Intermittent                         [facility] Adult PT Treatment/Exercise - 04/05/16 0001  Exercises   Exercises Knee/Hip     Knee/Hip Exercises: Aerobic   Nustep Level 2 x 20 minutes moving forward times 2 from seat 12 to seat 10.     Knee/Hip Exercises: Standing   Rocker Board 5 minutes  PF/DF and calf stretching     Knee/Hip Exercises: Supine   Quad Sets Right;3 sets;10 reps   Heel Slides AROM;Right;AAROM;3 sets;10 reps   Straight Leg Raises AROM;3 sets;10 reps     Modalities   Modalities Cryotherapy     Cryotherapy   Number Minutes Cryotherapy 15 Minutes   Cryotherapy Location Knee   Type of Cryotherapy Ice pack     Manual Therapy   Manual Therapy Passive ROM   Manual therapy comments Gentle right knee flexion low load long duration stretching    Passive ROM AAROM with SLR and heelslides                  PT Short Term Goals - 03/24/16 1225      PT SHORT TERM GOAL #1   Title independent with HEP (04/14/16)   Time 3   Period Weeks   Status New            PT Long Term Goals - 03/24/16 1226      PT LONG TERM GOAL #1   Title improve R knee AROM 0-95 degrees for improved function (05/05/16)   Time 6   Period Weeks   Status New     PT LONG TERM GOAL #2   Title ambulate > 200' without device independently for improved function (05/05/16)   Time 6   Period Weeks   Status New     PT LONG TERM GOAL #3   Title report pain < 2/10 with ADLs for improved function (05/05/16)   Time 6   Period Weeks   Status New     PT LONG TERM GOAL #4   Title tolerate standing and walking > 10 min without increase in pain for improved function (05/05/16)   Time 6   Period Weeks   Status New               Plan - 04/05/16 1412    Clinical Impression Statement Pt arrived to clinic not wearing stockings due to being to tight at the top. Pt did fairly well with Rx today and was able to perform SLR actively today without  assistance. He feels that he is doing better with ambulating now, but still using FWW, because he still has dizzy spells or feels light headed.  He has an MD appt. on Friday of this week. Current goals are on-going.   PT Frequency 3x / week   PT Duration 6 weeks   PT Treatment/Interventions ADLs/Self Care Home Management;Cryotherapy;Electrical Stimulation;Functional mobility training;Stair training;Gait training;DME Instruction;Moist Heat;Therapeutic activities;Therapeutic exercise;Balance training;Neuromuscular re-education;Patient/family education;Passive range of motion;Scar mobilization;Manual techniques   PT Next Visit Plan No vasopneumatic.    RT TKR Rehab   Consulted and Agree with Plan of Care Patient      Patient will benefit from skilled therapeutic intervention in order to improve the following deficits and impairments:  Abnormal gait, Cardiopulmonary status limiting activity, Decreased range of motion, Difficulty walking, Pain, Decreased endurance, Decreased activity tolerance, Decreased skin integrity, Impaired flexibility,  Decreased scar mobility, Decreased mobility, Decreased strength, Increased edema  Visit Diagnosis: Pain in right knee  Stiffness of right knee, not elsewhere classified  Other abnormalities of gait and mobility     Problem List  Patient Active Problem List   Diagnosis Date Noted  . 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 S/P knee replacement 03/21/2016  . Anemia associated with chronic renal failure 10/28/2015  . Hyperparathyroidism , secondary, non-renal ([place]) 10/28/2015  . Pulmonary emboli ([place]) 08/27/2015  . Near syncope 08/25/2015  . DOE (dyspnea on exertion) 06/02/2015  . Controlled type 2 diabetes mellitus ([place]) 06/02/2015  . Melanoma, history of 10/09/2013  . 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 Chronic kidney disease, stage III (moderate) 10/09/2013  . Venous (peripheral) insufficiency 10/09/2013  . Statin intolerance 10/09/2013  . Prostate cancer ([place]) 12/25/2012    Kolbi Altadonna,CHRIS, PTA 04/05/2016, 3:05 PM  [facility] [address] [place], [place], [address] Phone: [phone]   Fax:  [phone]  Name: Laydon Berteau. MRN: [id number] Date of Birth: 02/20/1941

## 2016-04-06 ENCOUNTER — Encounter: Payer: Medicare Other | Admitting: Physical Therapy

## 2016-04-07 ENCOUNTER — Ambulatory Visit: Payer: Medicare Other | Admitting: *Deleted

## 2016-04-07 DIAGNOSIS — M25661 Stiffness of right knee, not elsewhere classified: Secondary | ICD-10-CM | POA: Diagnosis not present

## 2016-04-07 DIAGNOSIS — M25561 Pain in right knee: Secondary | ICD-10-CM | POA: Diagnosis not present

## 2016-04-07 DIAGNOSIS — R2689 Other abnormalities of gait and mobility: Secondary | ICD-10-CM

## 2016-04-07 NOTE — Therapy (Signed)
[facility] [place], [place], [address] Phone: [phone]   Fax:  [phone]  Physical Therapy Treatment  Patient Details  Name: Ricardo Mayer. MRN: [id number] Date of Birth: [birth year] Referring Provider: Paralee Cancel, MD  Encounter Date: 04/07/2016      PT End of Session - 04/07/16 0951    Visit Number 6   Number of Visits 18   Date for PT Re-Evaluation 05/05/16   PT Start Time 0935   PT Stop Time 1033   PT Time Calculation (min) 58 min      Past Medical History:  Diagnosis Date  . Adenomatous polyp of colon   . Arthritis    bilateral knees  . CKD (chronic kidney disease), stage II    Dr. Patel,nephrology "stable last 3 yrs"  . Diabetes mellitus without complication ([place])    Dr. Laurance Flatten.PCP  . History of basal cell cancer   . History of DVT of lower extremity    2011--  LEFT LEG  . History of kidney stones   . History of melanoma excision    RIGHT ARM  . Hyperlipidemia   . Hypertension   . Impaired hearing    hearing aids bilateral  . Melanoma ([place]) 1990   arm and back -Melanoma, basal cell others- Dr. Edgardo Roys follows.  . Near syncope 08/25/2015   Dr. Roland Rack once"found blood clots in lung"  . Nocturia   . Prostate cancer [facility]) 2014   Dr. Cherylann Parr- "seed implant" '14  . Ulcer of left lower leg ([place])    recent pcp visit dr Laurance Flatten--  ordered betadine wet/dry dsg daily (03-18-2013)  [place] IN 1 WEEK    Past Surgical History:  Procedure Laterality Date  . KIDNEY STONE EXTRACTIONS  LAST ONE 1970   X2 OPEN/  X1  URETEROSCOPY  . MELANOMA EXCISION RIGHT ARM  1990  . PROSTATE BIOPSY    . RADIOACTIVE SEED IMPLANT N/A 03/21/2013   Procedure: RADIOACTIVE SEED IMPLANT;  Surgeon: Malka So, MD;  Location: [facility];  Service: Urology;  Laterality: N/A;  87 seeds implanted    . TONSILLECTOMY    . TOTAL KNEE ARTHROPLASTY Right 03/21/2016   Procedure: RIGHT  TOTAL KNEE ARTHROPLASTY;  Surgeon: Paralee Cancel, MD;  Location: [facility];  Service: Orthopedics;  Laterality: Right;    There were no vitals filed for this visit.      Subjective Assessment - 04/07/16 0944    Subjective  I was sick all day yesterday and wasn't able to do any Exercises.  Still get light headed at times and stockings are to tight around my legs that I had to cut them.  I took my pain medication prior to coming today so my pain is low.   Patient is accompained by: Family member   Pertinent History CKD, DM, hx basal cell cancer, HLD, HTN, DVT/PE   Limitations Standing;Walking   How long can you stand comfortably? 2-3 min   Patient Stated Goals improve motion and mobility, decrease pain   Currently in Pain? Yes   Pain Location Knee   Pain Orientation Right   Pain Descriptors / Indicators Aching   Pain Type Surgical pain   Pain Onset 1 to 4 weeks ago                         [facility] Adult PT Treatment/Exercise - 04/07/16 0001  Exercises   Exercises Knee/Hip     Knee/Hip Exercises: Aerobic   Nustep Level 4x 20 minutes moving forward  seat 10 to seat 9.     Knee/Hip Exercises: Standing   Rocker Board 5 minutes     Knee/Hip Exercises: Supine   Straight Leg Raises AROM;3 sets;10 reps     Cryotherapy   Number Minutes Cryotherapy 15 Minutes   Cryotherapy Location Knee   Type of Cryotherapy Ice pack     Manual Therapy   Manual Therapy Passive ROM   Manual therapy comments Gentle right knee flexion low load long duration stretching    Passive ROM AAROM with SLR and heelslides                  PT Short Term Goals - 03/24/16 1225      PT SHORT TERM GOAL #1   Title independent with HEP (04/14/16)   Time 3   Period Weeks   Status New           PT Long Term Goals - 03/24/16 1226      PT LONG TERM GOAL #1   Title improve R knee AROM 0-95 degrees for improved function (05/05/16)   Time 6   Period Weeks   Status New     PT LONG  TERM GOAL #2   Title ambulate > 200' without device independently for improved function (05/05/16)   Time 6   Period Weeks   Status New     PT LONG TERM GOAL #3   Title report pain < 2/10 with ADLs for improved function (05/05/16)   Time 6   Period Weeks   Status New     PT LONG TERM GOAL #4   Title tolerate standing and walking > 10 min without increase in pain for improved function (05/05/16)   Time 6   Period Weeks   Status New               Plan - 04/07/16 [id number]    Clinical Impression Statement Pt arrived to clinic today ambulating with FWW. He feels that his knee is doing well, but his CC is still that  gets light-headed a lot. He has been unable to perform many exercises at home due to not feeling well and being light-headed. His  ROM for RT knee today was 10-100 degrees today.  Goals are ongoing.   Rehab Potential Good   PT Frequency 3x / week   PT Duration 6 weeks   PT Treatment/Interventions ADLs/Self Care Home Management;Cryotherapy;Electrical Stimulation;Functional mobility training;Stair training;Gait training;DME Instruction;Moist Heat;Therapeutic activities;Therapeutic exercise;Balance training;Neuromuscular re-education;Patient/family education;Passive range of motion;Scar mobilization;Manual techniques   PT Next Visit Plan No vasopneumatic.    RT TKR Rehab     Pt to F/U with MD tomorrow. MD note sent   Consulted and Agree with Plan of Care Patient      Patient will benefit from skilled therapeutic intervention in order to improve the following deficits and impairments:  Abnormal gait, Cardiopulmonary status limiting activity, Decreased range of motion, Difficulty walking, Pain, Decreased endurance, Decreased activity tolerance, Decreased skin integrity, Impaired flexibility, Decreased scar mobility, Decreased mobility, Decreased strength, Increased edema  Visit Diagnosis: Pain in right knee  Stiffness of right knee, not elsewhere classified  Other  abnormalities of gait and mobility     Problem List Patient Active Problem List   Diagnosis Date Noted  . 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 S/P knee replacement 03/21/2016  . Anemia associated  with chronic renal failure 10/28/2015  . Hyperparathyroidism , secondary, non-renal ([place]) 10/28/2015  . Pulmonary emboli ([place]) 08/27/2015  . Near syncope 08/25/2015  . DOE (dyspnea on exertion) 06/02/2015  . Controlled type 2 diabetes mellitus ([place]) 06/02/2015  . Melanoma, history of 10/09/2013  . 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 Chronic kidney disease, stage III (moderate) 10/09/2013  . Venous (peripheral) insufficiency 10/09/2013  . Statin intolerance 10/09/2013  . Prostate cancer ([place]) 12/25/2012    APPLEGATE, [place], , PTA 04/07/2016, 11:33 AM [place] Applegate MPT [facility] [address] [place], [place], [address] Phone: [phone]   Fax:  [phone]  Name: Ricardo Mayer. MRN: [id number] Date of Birth: [birth year]

## 2016-04-08 ENCOUNTER — Observation Stay (HOSPITAL_COMMUNITY)
Admit: 2016-04-08 | Discharge: 2016-04-08 | Disposition: A | Discharge status: Home / Self Care | Payer: Medicare Other | Attending: Internal Medicine | Admitting: Internal Medicine

## 2016-04-08 ENCOUNTER — Observation Stay (HOSPITAL_COMMUNITY)
Admission: EM | Admit: 2016-04-08 | Discharge: 2016-04-09 | Disposition: A | Discharge status: Home / Self Care | Payer: Medicare Other | Attending: Internal Medicine | Admitting: Internal Medicine

## 2016-04-08 ENCOUNTER — Emergency Department (HOSPITAL_BASED_OUTPATIENT_CLINIC_OR_DEPARTMENT_OTHER)
Admit: 2016-04-08 | Discharge: 2016-04-08 | Disposition: A | Discharge status: Home / Self Care | Payer: Medicare Other | Attending: Emergency Medicine | Admitting: Emergency Medicine

## 2016-04-08 ENCOUNTER — Emergency Department (HOSPITAL_COMMUNITY): Payer: Medicare Other

## 2016-04-08 ENCOUNTER — Observation Stay (HOSPITAL_COMMUNITY): Payer: Medicare Other

## 2016-04-08 ENCOUNTER — Encounter (HOSPITAL_COMMUNITY): Payer: Self-pay | Admitting: Emergency Medicine

## 2016-04-08 DIAGNOSIS — Z7901 Long term (current) use of anticoagulants: Secondary | ICD-10-CM | POA: Diagnosis not present

## 2016-04-08 DIAGNOSIS — N189 Chronic kidney disease, unspecified: Secondary | ICD-10-CM | POA: Diagnosis present

## 2016-04-08 DIAGNOSIS — D631 Anemia in chronic kidney disease: Secondary | ICD-10-CM | POA: Diagnosis not present

## 2016-04-08 DIAGNOSIS — R404 Transient alteration of awareness: Secondary | ICD-10-CM

## 2016-04-08 DIAGNOSIS — E1122 Type 2 diabetes mellitus with diabetic chronic kidney disease: Secondary | ICD-10-CM | POA: Diagnosis not present

## 2016-04-08 DIAGNOSIS — G459 Transient cerebral ischemic attack, unspecified: Secondary | ICD-10-CM | POA: Diagnosis not present

## 2016-04-08 DIAGNOSIS — I129 Hypertensive chronic kidney disease with stage 1 through stage 4 chronic kidney disease, or unspecified chronic kidney disease: Secondary | ICD-10-CM

## 2016-04-08 DIAGNOSIS — Z85820 Personal history of malignant melanoma of skin: Secondary | ICD-10-CM | POA: Insufficient documentation

## 2016-04-08 DIAGNOSIS — Z86711 Personal history of pulmonary embolism: Secondary | ICD-10-CM | POA: Insufficient documentation

## 2016-04-08 DIAGNOSIS — E1159 Type 2 diabetes mellitus with other circulatory complications: Secondary | ICD-10-CM

## 2016-04-08 DIAGNOSIS — Z85828 Personal history of other malignant neoplasm of skin: Secondary | ICD-10-CM | POA: Insufficient documentation

## 2016-04-08 DIAGNOSIS — D638 Anemia in other chronic diseases classified elsewhere: Secondary | ICD-10-CM

## 2016-04-08 DIAGNOSIS — N179 Acute kidney failure, unspecified: Secondary | ICD-10-CM | POA: Insufficient documentation

## 2016-04-08 DIAGNOSIS — Z96651 Presence of right artificial knee joint: Secondary | ICD-10-CM | POA: Diagnosis not present

## 2016-04-08 DIAGNOSIS — N183 Chronic kidney disease, stage 3 (moderate): Secondary | ICD-10-CM | POA: Diagnosis not present

## 2016-04-08 DIAGNOSIS — I82431 Acute embolism and thrombosis of right popliteal vein: Secondary | ICD-10-CM | POA: Diagnosis not present

## 2016-04-08 DIAGNOSIS — R55 Syncope and collapse: Secondary | ICD-10-CM

## 2016-04-08 DIAGNOSIS — R4020 Unspecified coma: Secondary | ICD-10-CM

## 2016-04-08 DIAGNOSIS — N184 Chronic kidney disease, stage 4 (severe): Secondary | ICD-10-CM | POA: Diagnosis present

## 2016-04-08 DIAGNOSIS — R0609 Other forms of dyspnea: Secondary | ICD-10-CM | POA: Diagnosis not present

## 2016-04-08 DIAGNOSIS — E211 Secondary hyperparathyroidism, not elsewhere classified: Secondary | ICD-10-CM | POA: Diagnosis not present

## 2016-04-08 DIAGNOSIS — Z7984 Long term (current) use of oral hypoglycemic drugs: Secondary | ICD-10-CM | POA: Insufficient documentation

## 2016-04-08 DIAGNOSIS — R42 Dizziness and giddiness: Secondary | ICD-10-CM | POA: Diagnosis not present

## 2016-04-08 DIAGNOSIS — Z8546 Personal history of malignant neoplasm of prostate: Secondary | ICD-10-CM | POA: Insufficient documentation

## 2016-04-08 DIAGNOSIS — I82411 Acute embolism and thrombosis of right femoral vein: Secondary | ICD-10-CM | POA: Diagnosis not present

## 2016-04-08 DIAGNOSIS — Z66 Do not resuscitate: Secondary | ICD-10-CM | POA: Insufficient documentation

## 2016-04-08 DIAGNOSIS — Z79899 Other long term (current) drug therapy: Secondary | ICD-10-CM | POA: Diagnosis not present

## 2016-04-08 HISTORY — DX: Transient cerebral ischemic attack, unspecified: G45.9

## 2016-04-08 LAB — CBC WITH DIFFERENTIAL/PLATELET
BASOS PCT: 0 %
Basophils Absolute: 0 10*3/uL (ref 0.0–0.1)
EOS ABS: 0.1 10*3/uL (ref 0.0–0.7)
Eosinophils Relative: 1 %
HEMATOCRIT: 32.2 % — AB (ref 39.0–52.0)
HEMOGLOBIN: 10.9 g/dL — AB (ref 13.0–17.0)
Lymphocytes Relative: 19 %
Lymphs Abs: 2 10*3/uL (ref 0.7–4.0)
MCH: 28.7 pg (ref 26.0–34.0)
MCHC: 33.9 g/dL (ref 30.0–36.0)
MCV: 84.7 fL (ref 78.0–100.0)
Monocytes Absolute: 0.9 10*3/uL (ref 0.1–1.0)
Monocytes Relative: 9 %
NEUTROS ABS: 7.9 10*3/uL — AB (ref 1.7–7.7)
NEUTROS PCT: 71 %
Platelets: 362 10*3/uL (ref 150–400)
RBC: 3.8 MIL/uL — AB (ref 4.22–5.81)
RDW: 14.4 % (ref 11.5–15.5)
WBC: 11 10*3/uL — AB (ref 4.0–10.5)

## 2016-04-08 LAB — COMPREHENSIVE METABOLIC PANEL
ALBUMIN: 3.8 g/dL (ref 3.5–5.0)
ALT: 17 U/L (ref 17–63)
AST: 21 U/L (ref 15–41)
Alkaline Phosphatase: 64 U/L (ref 38–126)
Anion gap: 8 (ref 5–15)
BUN: 44 mg/dL — ABNORMAL HIGH (ref 6–20)
CHLORIDE: 104 mmol/L (ref 101–111)
CO2: 20 mmol/L — AB (ref 22–32)
CREATININE: 2.32 mg/dL — AB (ref 0.61–1.24)
Calcium: 9.5 mg/dL (ref 8.9–10.3)
GFR calc non Af Amer: 26 mL/min — ABNORMAL LOW (ref >60)
GFR, EST AFRICAN AMERICAN: 30 mL/min — AB (ref >60)
Glucose, Bld: 175 mg/dL — ABNORMAL HIGH (ref 65–99)
POTASSIUM: 4.6 mmol/L (ref 3.5–5.1)
SODIUM: 132 mmol/L — AB (ref 135–145)
Total Bilirubin: 0.8 mg/dL (ref 0.3–1.2)
Total Protein: 6.6 g/dL (ref 6.5–8.1)

## 2016-04-08 LAB — URINALYSIS, ROUTINE W REFLEX MICROSCOPIC
BILIRUBIN URINE: NEGATIVE
Glucose, UA: NEGATIVE mg/dL
Hgb urine dipstick: NEGATIVE
KETONES UR: NEGATIVE mg/dL
Leukocytes, UA: NEGATIVE
NITRITE: NEGATIVE
PROTEIN: NEGATIVE mg/dL
SPECIFIC GRAVITY, URINE: 1.009 (ref 1.005–1.030)
pH: 5.5 (ref 5.0–8.0)

## 2016-04-08 LAB — I-STAT TROPONIN, ED: Troponin i, poc: 0 ng/mL (ref 0.00–0.08)

## 2016-04-08 LAB — I-STAT CG4 LACTIC ACID, ED
LACTIC ACID, VENOUS: 1.72 mmol/L (ref 0.5–1.9)
Lactic Acid, Venous: 1.93 mmol/L (ref 0.5–1.9)

## 2016-04-08 LAB — D-DIMER, QUANTITATIVE (NOT AT ARMC): D DIMER QUANT: 3.79 ug{FEU}/mL — AB (ref 0.00–0.50)

## 2016-04-08 LAB — CBG MONITORING, ED: GLUCOSE-CAPILLARY: 176 mg/dL — AB (ref 65–99)

## 2016-04-08 MED ORDER — FENOFIBRATE 160 MG PO TABS
160.0000 mg | ORAL_TABLET | Freq: Every day | ORAL | Status: DC
  Administered 2016-04-09: 160 mg via ORAL
  Filled 2016-04-08: qty 1

## 2016-04-08 MED ORDER — SODIUM CHLORIDE 0.9 % IV BOLUS (SEPSIS)
1000.0000 mL | Freq: Once | INTRAVENOUS | Status: AC
  Administered 2016-04-08: 1000 mL via INTRAVENOUS

## 2016-04-08 MED ORDER — HYDROCODONE-ACETAMINOPHEN 7.5-325 MG PO TABS: 1.0000 | ORAL_TABLET | ORAL | Status: DC | PRN

## 2016-04-08 MED ORDER — STROKE: EARLY STAGES OF RECOVERY BOOK
Freq: Once | Status: AC
  Administered 2016-04-08: 15:00:00
  Filled 2016-04-08: qty 1

## 2016-04-08 MED ORDER — RIVAROXABAN 20 MG PO TABS
20.0000 mg | ORAL_TABLET | Freq: Every day | ORAL | Status: DC
  Administered 2016-04-09: 20 mg via ORAL
  Filled 2016-04-08: qty 1

## 2016-04-08 MED ORDER — SODIUM CHLORIDE 0.9 % IV SOLN
INTRAVENOUS | Status: AC
  Administered 2016-04-08: 17:00:00 via INTRAVENOUS

## 2016-04-08 MED ORDER — SENNOSIDES-DOCUSATE SODIUM 8.6-50 MG PO TABS: 1.0000 | ORAL_TABLET | Freq: Every evening | ORAL | Status: DC | PRN

## 2016-04-08 MED ORDER — TIZANIDINE HCL 4 MG PO TABS
4.0000 mg | ORAL_TABLET | Freq: Four times a day (QID) | ORAL | Status: DC | PRN
  Filled 2016-04-08: qty 1

## 2016-04-08 MED ORDER — MIRABEGRON ER 50 MG PO TB24
50.0000 mg | ORAL_TABLET | Freq: Every day | ORAL | Status: DC
  Administered 2016-04-09: 50 mg via ORAL
  Filled 2016-04-08: qty 1

## 2016-04-08 MED ORDER — FERROUS SULFATE 325 (65 FE) MG PO TABS
325.0000 mg | ORAL_TABLET | Freq: Three times a day (TID) | ORAL | Status: DC
  Administered 2016-04-09: 325 mg via ORAL
  Filled 2016-04-08: qty 1

## 2016-04-08 NOTE — ED Notes (Signed)
Pt transported to nuclear medicine. 

## 2016-04-08 NOTE — Progress Notes (Signed)
EEG completed; results pending.    

## 2016-04-08 NOTE — H&P (Signed)
History and Physical    Ricardo D Knape Jr. [id number] DOB: Jul 14, 1941 DOA: 04/08/2016  PCP: [name], MD  Outpatient Specialists: Cardiology, Dr. Irish Lack Patient coming from: Home  Chief Complaint: Syncopal episode  HPI: Ricardo Mayer. is a 75 y.o. male with medical history significant of hypertension, chronic kidney disease stage III, type 2 diabetes, history of PE, presents to the emergency room chief complaint of syncopal episode today. Patient recently had a total knee arthroplasty on the right, and was supposed to see an orthopedic surgeon, however when he was about to get off of his car he felt like he is unable to, states that his "vision darkened like a curtain",felt extremely weak. His son, who is at bedside, states the patient passed out and so very short bursts of intermittent upper extremity jerking, similar to a seizure. Patient recovered quickly, there are no reported postictal symptoms. Ever since he had his surgery on his knee done couple weeks ago, patient states that he has been having intermittent presyncopal episodes where he felt something was coming and then to sit down right away. He has a history of similar symptoms last December/January of this year, he was extensively worked up and he was found to have PE. He was started on Xarelto at that time and has been taking it since, except for a period for about 7 days about 2 weeks ago when he had his knee replacement. He denies any fever or chills, he denies any chest pains or palpitations. He has no abdominal pain, no nausea, vomiting or diarrhea   ED Course: In the emergency room, his vital signs are stable, he is normotensive and satting well on room air, his blood work is remarkable for slightly elevated creatinine than his baseline at 2.32, Urinalysis is unremarkable, he underwent VQ scan out of concern for recurrent PE which was negative. He also had a venous ultrasound of the lower extremities which showed  persistent right lower extremity DVT in the femoral and popliteal veins with early recanalization. No DVT on left. Of note, CT scan of the brain showed artifact versus age-indeterminate lacunar infarct in the left side of the pons.  Review of Systems: As per HPI otherwise 10 point review of systems negative.   Past Medical History:  Diagnosis Date  . Adenomatous polyp of colon   . Arthritis    bilateral knees  . CKD (chronic kidney disease), stage II    Dr. Patel,nephrology "stable last 3 yrs"  . Diabetes mellitus without complication ([place])    Dr. Laurance Flatten.PCP  . History of basal cell cancer   . History of DVT of lower extremity    2011--  LEFT LEG  . History of kidney stones   . History of melanoma excision    RIGHT ARM  . Hyperlipidemia   . Hypertension   . Impaired hearing    hearing aids bilateral  . Melanoma ([place]) 1990   arm and back -Melanoma, basal cell others- Dr. Edgardo Roys follows.  . Near syncope 08/25/2015   Dr. Roland Rack once"found blood clots in lung"  . Nocturia   . Prostate cancer [facility]) 2014   Dr. Cherylann Parr- "seed implant" '14  . Ulcer of left lower leg ([place])    recent pcp visit dr Laurance Flatten--  ordered betadine wet/dry dsg daily (03-18-2013)  [place] IN 1 WEEK    Past Surgical History:  Procedure Laterality Date  . KIDNEY STONE EXTRACTIONS  LAST ONE 1970   X2 OPEN/  X1  URETEROSCOPY  . MELANOMA EXCISION RIGHT ARM  1990  . PROSTATE BIOPSY    . RADIOACTIVE SEED IMPLANT N/A 03/21/2013   Procedure: RADIOACTIVE SEED IMPLANT;  Surgeon: Malka So, MD;  Location: [facility];  Service: Urology;  Laterality: N/A;  87 seeds implanted    . TONSILLECTOMY    . TOTAL KNEE ARTHROPLASTY Right 03/21/2016   Procedure: RIGHT TOTAL KNEE ARTHROPLASTY;  Surgeon: Paralee Cancel, MD;  [facility];  Service: Orthopedics;  Laterality: Right;     reports that he has never smoked. He has never used smokeless tobacco. He reports that he does  not drink alcohol or use drugs.  No Known Allergies  Family History  Problem Relation Age of Onset  . Breast cancer Mother   . Stroke Mother   . Hypertension Mother   . Lung cancer Father   . Ulcers Father   . Esophageal cancer Brother   . Cancer Paternal Uncle     prostate  . Cancer Paternal Grandfather     prostate  . Heart attack Neg Hx     Prior to Admission medications   Medication Sig Start Date End Date Taking? Authorizing Provider  amLODipine-valsartan (EXFORGE) 10-320 MG tablet TAKE 1 TABLET BY MOUTH DAILY. 02/15/16  Yes [name], MD  doxycycline (VIBRAMYCIN) 100 MG capsule Take 1 capsule (100 mg total) by mouth 2 (two) times daily. 03/22/16 04/21/16 Yes Matthew Babish, PA-C  fenofibrate 160 MG tablet TAKE 1 TABLET BY MOUTH DAILY Patient taking differently: TAKE 1 TABLET (160 mg) BY MOUTH DAILY 05/17/15  Yes Tiffany A Gann, PA-C  furosemide (LASIX) 20 MG tablet Take 1 tablet (20 mg total) by mouth daily. Patient taking differently: Take 20 mg by mouth daily as needed for fluid.  05/29/15  Yes Carmin Muskrat, MD  glucose blood (ONE TOUCH ULTRA TEST) test strip USE FOR TESTING SUGAR 2 to 3 TIMES DAILY E11.9 09/18/15  Yes [name], MD  MYRBETRIQ 50 MG TB24 tablet Take 50 mg by mouth daily with breakfast. 02/27/16  Yes Historical Provider, MD  polyethylene glycol (MIRALAX / GLYCOLAX) packet Take 17 g by mouth 2 (two) times daily. Patient taking differently: Take 17 g by mouth daily as needed for mild constipation.  03/22/16  Yes Danae Orleans, PA-C  rivaroxaban (XARELTO) 20 MG TABS tablet Take one tablet by mouth daily with food Patient taking differently: Take 20 mg by mouth daily with breakfast.  02/15/16  Yes [name], MD  sitaGLIPtin (JANUVIA) 50 MG tablet TAKE 1 TABLET (50 MG TOTAL) BY MOUTH DAILY. 02/15/16  Yes [name], MD  tiZANidine (ZANAFLEX) 4 MG tablet Take 1 tablet (4 mg total) by mouth every 6 (six) hours as needed for muscle spasms. 03/22/16  Yes Danae Orleans, PA-C  traMADol (ULTRAM) 50 MG tablet Take 50-100 mg by mouth every 8 (eight) hours as needed for moderate pain.   Yes Historical Provider, MD  vitamin B-12 (CYANOCOBALAMIN) 1000 MCG tablet Take 1,000 mcg by mouth daily.   Yes Historical Provider, MD  docusate sodium (COLACE) 100 MG capsule Take 1 capsule (100 mg total) by mouth 2 (two) times daily. Patient not taking: Reported on 04/08/2016 03/22/16   Danae Orleans, PA-C  ferrous sulfate 325 (65 FE) MG tablet Take 1 tablet (325 mg total) by mouth 3 (three) times daily after meals. Patient not taking: Reported on 04/08/2016 03/22/16   Danae Orleans, PA-C  HYDROcodone-acetaminophen (NORCO) 7.5-325 MG tablet Take  1-2 tablets by mouth every 4 (four) hours as needed for moderate pain. Patient not taking: Reported on 04/08/2016 03/22/16   Danae Orleans, PA-C    Physical Exam: Vitals:   04/08/16 1215 04/08/16 1230 04/08/16 1254 04/08/16 1433  BP: 135/65 111/92  131/67  Pulse: 86 88  88  Resp: 18 22  21   Temp:   98.2 F (36.8 C)   TempSrc:   Oral   SpO2: 99% 98%  100%  Weight:      Height:          Constitutional: NAD, calm, comfortable Vitals:   04/08/16 1215 04/08/16 1230 04/08/16 1254 04/08/16 1433  BP: 135/65 111/92  131/67  Pulse: 86 88  88  Resp: 18 22  21   Temp:   98.2 F (36.8 C)   TempSrc:   Oral   SpO2: 99% 98%  100%  Weight:      Height:       Eyes: PERRL, lids and conjunctivae normal ENMT: Mucous membranes are moist. Posterior pharynx clear of any exudate or lesions Neck: normal, supple, no masses, Respiratory: clear to auscultation bilaterally, no wheezing, no crackles. Normal respiratory effort. No accessory muscle use.  Cardiovascular: Regular rate and rhythm, no murmurs / rubs / gallops. No extremity edema. 2+ pedal pulses.  Abdomen: no tenderness, no masses palpated. Bowel sounds positive.  Musculoskeletal: no clubbing / cyanosis. Normal muscle tone. Intact dressing on the right knee  Skin: Chronic venous  stasis changes bilateral lower extremities  Neurologic: CN 2-12 grossly intact. Strength 5/5 in all 4.  Psychiatric: Normal judgment and insight. Alert and oriented x 3. Normal mood.   Labs on Admission: I have personally reviewed following labs and imaging studies  CBC:  Recent Labs Lab 04/08/16 0838  WBC 11.0*  NEUTROABS 7.9*  HGB 10.9*  HCT 32.2*  MCV 84.7  PLT 123XX123   Basic Metabolic Panel:  Recent Labs Lab 04/08/16 0838  NA 132*  K 4.6  CL 104  CO2 20*  GLUCOSE 175*  BUN 44*  CREATININE 2.32*  CALCIUM 9.5   Liver Function Tests:  Recent Labs Lab 04/08/16 0838  AST 21  ALT 17  ALKPHOS 64  BILITOT 0.8  PROT 6.6  ALBUMIN 3.8   CBG:  Recent Labs Lab 04/08/16 0837  GLUCAP 176*   Urine analysis:    Component Value Date/Time   COLORURINE YELLOW 04/08/2016 [place] 04/08/2016 0850   APPEARANCEUR Clear 04/15/2015 0814   LABSPEC 1.009 04/08/2016 0850   PHURINE 5.5 04/08/2016 0850   GLUCOSEU NEGATIVE 04/08/2016 0850   HGBUR NEGATIVE 04/08/2016 0850   BILIRUBINUR NEGATIVE 04/08/2016 0850   BILIRUBINUR Negative 04/15/2015 [place] 04/08/2016 0850   PROTEINUR NEGATIVE 04/08/2016 0850   UROBILINOGEN negative 12/16/2014 1529   NITRITE NEGATIVE 04/08/2016 0850   LEUKOCYTESUR NEGATIVE 04/08/2016 0850   LEUKOCYTESUR Negative 04/15/2015 0814   Radiological Exams on Admission: Ct Head Wo Contrast  Result Date: 04/08/2016 CLINICAL DATA:  Dizziness. Lightheadedness. Seizure versus syncope. EXAM: CT HEAD WITHOUT CONTRAST TECHNIQUE: Contiguous axial images were obtained from the base of the skull through the vertex without intravenous contrast. COMPARISON:  None. FINDINGS: Brain: Mild cerebral atrophy. Minimal low density in the periventricular white matter likely related to small vessel disease. Artifact versus age-indeterminate lacunar infarct in the left this side of the pons, including on image 10/series 3. Otherwise, no mass  lesion, hemorrhage, hydrocephalus, acute infarct, intra-axial, or extra-axial fluid collection. Vascular: Bilateral atherosclerosis  in the vertebral arteries and intracranial carotid arteries. Skull: Normal Sinuses/Orbits: Normal orbits and globes. Clear paranasal sinuses and mastoid air cells. Other: None IMPRESSION: 1. 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 2.  Cerebral atrophy and small vessel ischemic change. Electronically Signed   By: Abigail Miyamoto M.D.   On: 04/08/2016 09:49   Nm Pulmonary Perf And Vent  Result Date: 04/08/2016 CLINICAL DATA:  RIGHT knee replacement 03/21/2016. Syncopal episode. EXAM: NUCLEAR MEDICINE VENTILATION - PERFUSION LUNG SCAN TECHNIQUE: Ventilation images were obtained in multiple projections using inhaled aerosol Tc-24m DTPA. Perfusion images were obtained in multiple projections after intravenous injection of Tc-20m MAA. RADIOPHARMACEUTICALS:  32.6 mCi Technetium-28m DTPA aerosol inhalation and 4.2 mCi Technetium-22m MAA IV COMPARISON:  None FINDINGS: Ventilation: No focal ventilation defect. Perfusion: No wedge shaped peripheral perfusion defects to suggest acute pulmonary embolism. IMPRESSION: No evidence of acute pulmonary embolism. Electronically Signed   By: Suzy Bouchard M.D.   On: 04/08/2016 14:08    EKG: Independently reviewed. 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   Chronic kidney disease, stage III (moderate)   DOE (dyspnea on exertion)   Controlled type 2 diabetes mellitus ([facility])   Near syncope   Anemia associated with chronic renal failure   S/P right TKA   LOC (loss of consciousness)   TIA (transient ischemic attack)    Syncopal episode / TIA - Unclear etiology at this point, patient does appear to have had a seizure however without any postictal features, - Admit to  telemetry - we'll obtain an EEG - Given CT findings of possible infarct, obtain MRI of the brain, if MRI is positive for CVA and a neurology consult, and rest of the workup with a 2-D echo as well as carotids  Acute on chronic renal failure - Looks a bit dry, provide IV fluids and recheck renal function in the morning  Recent diagnosis of DVT and PE - Continue Xarelto, VQ scan is negative for recurrent PE to explain his syncopal episodes  Status post right TKA  - Obtain physical therapy consult  Hypertension - Hold losartan given renal failure  Type 2 diabetes mellitus - Check hemoglobin A1c, continue sliding scale insulin while hospitalized    DVT prophylaxis: Xarelto  Code Status: DNR  Family Communication: wife bedside Disposition Plan: admit to telemetry  Consults called: none   Admission status: observation    Marzetta Board, MD [facility] Pager [phone]  If 7PM-7AM, please contact night-coverage [email or web address] Password [email or web address]  04/08/2016, 2:42 PM

## 2016-04-08 NOTE — ED Notes (Signed)
Pt went to restroom to attempt to provide urine sample on arrival to room but pt was unable to provide specimen at that time

## 2016-04-08 NOTE — Progress Notes (Signed)
VASCULAR LAB PRELIMINARY  PRELIMINARY  PRELIMINARY  PRELIMINARY  Bilateral lower extremity venous duplex completed.    Preliminary report:  Right lower extremity DVT still remains in femoral, and popliteal veins. There is early recanalization in the in the femoral. No evidence of superficial thrombosis or Bakers. Left lower extremity remains free of DVT,supeficial thrombus, and Baker's cyst.  Shantana Christon, RVS 04/08/2016, 11:56 AM

## 2016-04-08 NOTE — ED Notes (Signed)
Pt transported to CT ?

## 2016-04-08 NOTE — ED Notes (Signed)
Liu MD at bedside

## 2016-04-08 NOTE — ED Notes (Signed)
[facility] MD at bedside, family at the bedside

## 2016-04-08 NOTE — ED Provider Notes (Signed)
[facility] DEPT Provider Note   CSN: [id number] Arrival date & time: 04/08/16  [id number]     History   Chief Complaint Chief Complaint  Patient presents with  . Seizures  . Dizziness    HPI Ricardo Lees. is a 75 y.o. male.  HPI 76 year old male who presents with episode of LOC. He has a history of diabetes, hypertension, stage III kidney disease, PE/DVT on Xarelto, and recent right knee arthroplasty by Dr. Ihor Gully on 03/21/2016. History is provided by both patient and her wife. He states that he has been having episodes of dizziness, described as near syncope since his surgery primarily after activity or exercising. Today was on his way for a follow-up appointment in the car, and began to complain of feeling lightheaded and developed blurry vision. While seated in the car he had loss of consciousness. Family states that it lasted less than 1 minute. He had shaking of his upper extremities, and drooling, and "moaning noise." He did not have urinary incontinence, tongue biting, and states that after coming to he was immediately oriented and did not have prolonged confusion. Has not had recent illnesses including fevers, chills, abdominal pain, back pain, chest pain, or difficulty breathing. Has had diminished swelling in his lower extremities since his surgery and denies any leg pain. This is similar in presentation to when he had diagnosis of PE in January, where he had recurrent syncopal episodes.   Past Medical History:  Diagnosis Date  . Adenomatous polyp of colon   . Arthritis    bilateral knees  . CKD (chronic kidney disease), stage II    Dr. Patel,nephrology "stable last 3 yrs"  . Diabetes mellitus without complication ([place])    Dr. Laurance Flatten.PCP  . History of basal cell cancer   . History of DVT of lower extremity    2011--  LEFT LEG  . History of kidney stones   . History of melanoma excision    RIGHT ARM  . Hyperlipidemia   . Hypertension   . Impaired hearing    hearing  aids bilateral  . Melanoma ([place]) 1990   arm and back -Melanoma, basal cell others- Dr. Edgardo Roys follows.  . Near syncope 08/25/2015   Dr. Roland Rack once"found blood clots in lung"  . Nocturia   . Prostate cancer [facility]) 2014   Dr. Cherylann Parr- "seed implant" '14  . Ulcer of left lower leg ([place])    recent pcp visit dr Laurance Flatten--  ordered betadine wet/dry dsg daily (03-18-2013)  [facility] [place] IN 1 WEEK    Patient Active Problem List   Diagnosis Date Noted  . LOC (loss of consciousness) 04/08/2016  . TIA (transient ischemic attack) 04/08/2016  . 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 S/P knee replacement 03/21/2016  . Anemia associated with chronic renal failure 10/28/2015  . Hyperparathyroidism , secondary, non-renal ([place]) 10/28/2015  . Pulmonary emboli ([place]) 08/27/2015  . Near syncope 08/25/2015  . DOE (dyspnea on exertion) 06/02/2015  . Controlled type 2 diabetes mellitus ([place]) 06/02/2015  . Melanoma, history of 10/09/2013  . 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 Chronic kidney disease, stage III (moderate) 10/09/2013  . Venous (peripheral) insufficiency 10/09/2013  . Statin intolerance 10/09/2013  . Prostate cancer ([place]) 12/25/2012    Past Surgical History:  Procedure Laterality Date  . KIDNEY STONE EXTRACTIONS  LAST ONE 1970   X2 OPEN/  X1  URETEROSCOPY  . MELANOMA EXCISION RIGHT ARM  1990  .  PROSTATE BIOPSY    . RADIOACTIVE SEED IMPLANT N/A 03/21/2013   Procedure: RADIOACTIVE SEED IMPLANT;  Surgeon: Malka So, MD;  Location: [facility];  Service: Urology;  Laterality: N/A;  87 seeds implanted    . TONSILLECTOMY    . TOTAL KNEE ARTHROPLASTY Right 03/21/2016   Procedure: RIGHT TOTAL KNEE ARTHROPLASTY;  Surgeon: Paralee Cancel, MD;  Location: [facility];  Service: Orthopedics;  Laterality: Right;       Home Medications    Prior to Admission medications   Medication Sig Start Date End Date Taking? Authorizing  Provider  amLODipine-valsartan (EXFORGE) 10-320 MG tablet TAKE 1 TABLET BY MOUTH DAILY. 02/15/16  Yes [name], MD  doxycycline (VIBRAMYCIN) 100 MG capsule Take 1 capsule (100 mg total) by mouth 2 (two) times daily. 03/22/16 04/21/16 Yes Matthew Babish, PA-C  fenofibrate 160 MG tablet TAKE 1 TABLET BY MOUTH DAILY Patient taking differently: TAKE 1 TABLET (160 mg) BY MOUTH DAILY 05/17/15  Yes Tiffany A Gann, PA-C  furosemide (LASIX) 20 MG tablet Take 1 tablet (20 mg total) by mouth daily. Patient taking differently: Take 20 mg by mouth daily as needed for fluid.  05/29/15  Yes Carmin Muskrat, MD  glucose blood (ONE TOUCH ULTRA TEST) test strip USE FOR TESTING SUGAR 2 to 3 TIMES DAILY E11.9 09/18/15  Yes [name], MD  MYRBETRIQ 50 MG TB24 tablet Take 50 mg by mouth daily with breakfast. 02/27/16  Yes Historical Provider, MD  polyethylene glycol (MIRALAX / GLYCOLAX) packet Take 17 g by mouth 2 (two) times daily. Patient taking differently: Take 17 g by mouth daily as needed for mild constipation.  03/22/16  Yes Danae Orleans, PA-C  rivaroxaban (XARELTO) 20 MG TABS tablet Take one tablet by mouth daily with food Patient taking differently: Take 20 mg by mouth daily with breakfast.  02/15/16  Yes [name], MD  sitaGLIPtin (JANUVIA) 50 MG tablet TAKE 1 TABLET (50 MG TOTAL) BY MOUTH DAILY. 02/15/16  Yes [name], MD  tiZANidine (ZANAFLEX) 4 MG tablet Take 1 tablet (4 mg total) by mouth every 6 (six) hours as needed for muscle spasms. 03/22/16  Yes Danae Orleans, PA-C  traMADol (ULTRAM) 50 MG tablet Take 50-100 mg by mouth every 8 (eight) hours as needed for moderate pain.   Yes Historical Provider, MD  vitamin B-12 (CYANOCOBALAMIN) 1000 MCG tablet Take 1,000 mcg by mouth daily.   Yes Historical Provider, MD  docusate sodium (COLACE) 100 MG capsule Take 1 capsule (100 mg total) by mouth 2 (two) times daily. Patient not taking: Reported on 04/08/2016 03/22/16   Danae Orleans, PA-C  ferrous sulfate  325 (65 FE) MG tablet Take 1 tablet (325 mg total) by mouth 3 (three) times daily after meals. Patient not taking: Reported on 04/08/2016 03/22/16   Danae Orleans, PA-C  HYDROcodone-acetaminophen (NORCO) 7.5-325 MG tablet Take 1-2 tablets by mouth every 4 (four) hours as needed for moderate pain. Patient not taking: Reported on 04/08/2016 03/22/16   Danae Orleans, PA-C    Family History Family History  Problem Relation Age of Onset  . Breast cancer Mother   . Stroke Mother   . Hypertension Mother   . Lung cancer Father   . Ulcers Father   . Esophageal cancer Brother   . Cancer Paternal Uncle     prostate  . Cancer Paternal Grandfather     prostate  . Heart attack Neg Hx     Social History Social History  Substance Use Topics  . Smoking status: Never Smoker  . Smokeless tobacco: Never Used  . Alcohol use No     Allergies   Review of patient's allergies indicates no known allergies.   Review of Systems Review of Systems 10/14 systems reviewed and are negative other than those stated in the HPI   Physical Exam Updated Vital Signs BP 138/66 (BP Location: Left Arm)   Pulse 82   Temp 98.7 F (37.1 C) (Oral)   Resp 18   Ht 5\' 7"  (1.702 m)   Wt 230 lb (104.3 kg)   SpO2 100%   BMI 36.02 kg/m   Physical Exam Physical Exam  Nursing note and vitals reviewed. Constitutional: Well developed, well nourished, non-toxic, and in no acute distress Head: Normocephalic and atraumatic.  Mouth/Throat: Oropharynx is clear and moist.  Neck: Normal range of motion. Neck supple.  Cardiovascular: Normal rate and regular rhythm.   Pulmonary/Chest: Effort normal and breath sounds normal.  Abdominal: Soft. There is no tenderness. There is no rebound and no guarding.  Musculoskeletal: No significant lower extremity edema. Neurological: Alert, no facial droop, fluent speech, moves all extremities symmetrically Skin: Skin is warm and dry.  Psychiatric: Cooperative   ED Treatments /  Results  Labs (all labs ordered are listed, but only abnormal results are displayed) Labs Reviewed  CBC WITH DIFFERENTIAL/PLATELET - Abnormal; Notable for the following:       Result Value   WBC 11.0 (*)    RBC 3.80 (*)    Hemoglobin 10.9 (*)    HCT 32.2 (*)    Neutro Abs 7.9 (*)    All other components within normal limits  COMPREHENSIVE METABOLIC PANEL - Abnormal; Notable for the following:    Sodium 132 (*)    CO2 20 (*)    Glucose, Bld 175 (*)    BUN 44 (*)    Creatinine, Ser 2.32 (*)    GFR calc non Af Amer 26 (*)    GFR calc Af Amer 30 (*)    All other components within normal limits  D-DIMER, QUANTITATIVE (NOT AT [facility]) - Abnormal; Notable for the following:    D-Dimer, Quant 3.79 (*)    All other components within normal limits  CBG MONITORING, ED - Abnormal; Notable for the following:    Glucose-Capillary 176 (*)    All other components within normal limits  I-STAT CG4 LACTIC ACID, ED - Abnormal; Notable for the following:    Lactic Acid, Venous 1.93 (*)    All other components within normal limits  URINALYSIS, ROUTINE W REFLEX MICROSCOPIC (NOT AT [facility])  I-STAT TROPOININ, ED  I-STAT CG4 LACTIC ACID, ED    EKG  EKG Interpretation  Date/Time:  Friday April 08 2016 08:23:48 EDT Ventricular Rate:  80 PR Interval:    QRS Duration: 122 QT Interval:  381 QTC Calculation: 440 R Axis:   -40 Text Interpretation:  Sinus rhythm Left bundle branch block Similar to prior EKG  Confirmed by Vung Kush MD, Shulem Mader [phone]) on 04/08/2016 8:29:13 AM       Radiology Ct Head Wo Contrast  Result Date: 04/08/2016 CLINICAL DATA:  Dizziness. Lightheadedness. Seizure versus syncope. EXAM: CT HEAD WITHOUT CONTRAST TECHNIQUE: Contiguous axial images were obtained from the base of the skull through the vertex without intravenous contrast. COMPARISON:  None. FINDINGS: Brain: Mild cerebral atrophy. Minimal low density in the periventricular white matter likely related to small vessel disease.  Artifact versus age-indeterminate lacunar infarct in the left this side  of the pons, including on image 10/series 3. Otherwise, no mass lesion, hemorrhage, hydrocephalus, acute infarct, intra-axial, or extra-axial fluid collection. Vascular: Bilateral atherosclerosis in the vertebral arteries and intracranial carotid arteries. Skull: Normal Sinuses/Orbits: Normal orbits and globes. Clear paranasal sinuses and mastoid air cells. Other: None IMPRESSION: 1. 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 2.  Cerebral atrophy and small vessel ischemic change. Electronically Signed   By: Abigail Miyamoto M.D.   On: 04/08/2016 09:49   Nm Pulmonary Perf And Vent  Result Date: 04/08/2016 CLINICAL DATA:  RIGHT knee replacement 03/21/2016. Syncopal episode. EXAM: NUCLEAR MEDICINE VENTILATION - PERFUSION LUNG SCAN TECHNIQUE: Ventilation images were obtained in multiple projections using inhaled aerosol Tc-2m DTPA. Perfusion images were obtained in multiple projections after intravenous injection of Tc-27m MAA. RADIOPHARMACEUTICALS:  32.6 mCi Technetium-43m DTPA aerosol inhalation and 4.2 mCi Technetium-41m MAA IV COMPARISON:  None FINDINGS: Ventilation: No focal ventilation defect. Perfusion: No wedge shaped peripheral perfusion defects to suggest acute pulmonary embolism. IMPRESSION: No evidence of acute pulmonary embolism. Electronically Signed   By: Suzy Bouchard M.D.   On: 04/08/2016 14:08    Procedures Procedures (including critical care time)  Medications Ordered in ED Medications  senna-docusate (Senokot-S) tablet 1 tablet (not administered)  ferrous sulfate tablet 325 mg (not administered)  HYDROcodone-acetaminophen (NORCO) 7.5-325 MG per tablet 1-2 tablet (not administered)  mirabegron ER (MYRBETRIQ) tablet 50 mg (not administered)  rivaroxaban (XARELTO) tablet 20 mg (not administered)  tiZANidine (ZANAFLEX) tablet 4 mg (not administered)  fenofibrate tablet 160 mg  (not administered)  0.9 %  sodium chloride infusion (not administered)  sodium chloride 0.9 % bolus 1,000 mL (0 mLs Intravenous Stopped 04/08/16 1008)   stroke: mapping our early stages of recovery book ( Does not apply Given 04/08/16 1445)     Initial Impression / Assessment and Plan / ED Course  I have reviewed the triage vital signs and the nursing notes.  Pertinent labs & imaging results that were available during my care of the patient were reviewed by me and considered in my medical decision making (see chart for details).  Clinical Course   Presenting with syncope vs seizure after recent knee surgery. Had seizure like activity w/o post ictal, w/ preceding lightheadedness, no tongue biting or urinary incontinence. Having recurrent episodes of near syncope recently as well, especially with exertional activity which is concerning for more serious syncopal event.    Hemodynamically stable and presentation, nontoxic in no acute distress. Appears mildly dry. Cardiopulmonary exam unremarkable. Neuro intact. EKG w/o stigmata of arrhythmia. Blood work with elevated creatinine. Normal trop. V/Q scan w/o PE or increased PE burden. No new DVT on LE [name].   Left pons with artifact vs age indeterminant infarct on CT. ? Potential neuro event w/ seizure.   Discussed with hospitalist service, will admit for further w/u for seizure vs syncope.  Final Clinical Impressions(s) / ED Diagnoses   Final diagnoses:  Syncope  Loss of consciousness    New Prescriptions Current Discharge Medication List       Forde Dandy, MD 04/08/16 2025

## 2016-04-08 NOTE — ED Notes (Signed)
Vascular at bedside

## 2016-04-08 NOTE — ED Notes (Signed)
Liu MD at bedside and aware of bilateral blood pressure readings.

## 2016-04-08 NOTE — ED Notes (Signed)
Hospitalist at bedside. Will obtain temperature with completion of hosptalist exam.

## 2016-04-08 NOTE — CV Procedure (Signed)
History: Ricardo Mayer is a 75 year old gentleman with a history of an suspicious for possible seizure activity.  Sedation: None.  Technique: This is a 21 channel routine scalp EEG performed at the bedside with bipolar and monopolar montages arranged in accordance to the international 10/20 system of electrode placement. One channel was dedicated to EKG recording.    Background: The background consists of intermixed alpha and beta activities. There is a well defined posterior dominant rhythm of 9 Hz that attenuates with eye opening. Sleep is recorded with normal appearing structures.   Photic stimulation: Physiologic driving is performed  EEG Abnormalities: None  Clinical Interpretation: This normal EEG is recorded in the waking and resting state. There was no seizure or seizure predisposition recorded on this study. Please note that a normal EEG does not preclude the possibility of epilepsy.   Dr. Gerda Diss. Tasia Catchings, MD Neurohospitalist

## 2016-04-08 NOTE — ED Triage Notes (Signed)
Pt complaint of ongoing dizziness since right knee surgery 03/21/16. Pt reports worse in the morning and after exercise. Pt continues to report recent blood clot in January or February.   This morning en route to follow up appointment pt reported severe dizziness. Wife reports post complaint pt began shaking, drooling, and moaning. Wife reported event lasting one minute and pt alert and oriented per normal post event. Event time 0755.

## 2016-04-09 DIAGNOSIS — N183 Chronic kidney disease, stage 3 (moderate): Secondary | ICD-10-CM

## 2016-04-09 DIAGNOSIS — R55 Syncope and collapse: Secondary | ICD-10-CM | POA: Diagnosis not present

## 2016-04-09 DIAGNOSIS — D638 Anemia in other chronic diseases classified elsewhere: Secondary | ICD-10-CM | POA: Diagnosis not present

## 2016-04-09 LAB — CBC
HCT: 30.4 % — ABNORMAL LOW (ref 39.0–52.0)
Hemoglobin: 10.4 g/dL — ABNORMAL LOW (ref 13.0–17.0)
MCH: 28.5 pg (ref 26.0–34.0)
MCHC: 34.2 g/dL (ref 30.0–36.0)
MCV: 83.3 fL (ref 78.0–100.0)
Platelets: 297 10*3/uL (ref 150–400)
RBC: 3.65 MIL/uL — ABNORMAL LOW (ref 4.22–5.81)
RDW: 14.4 % (ref 11.5–15.5)
WBC: 10 10*3/uL (ref 4.0–10.5)

## 2016-04-09 LAB — COMPREHENSIVE METABOLIC PANEL
ALBUMIN: 3.5 g/dL (ref 3.5–5.0)
ALK PHOS: 60 U/L (ref 38–126)
ALT: 15 U/L — AB (ref 17–63)
AST: 18 U/L (ref 15–41)
Anion gap: 6 (ref 5–15)
BILIRUBIN TOTAL: 1 mg/dL (ref 0.3–1.2)
BUN: 37 mg/dL — AB (ref 6–20)
CALCIUM: 9.3 mg/dL (ref 8.9–10.3)
CO2: 23 mmol/L (ref 22–32)
CREATININE: 1.83 mg/dL — AB (ref 0.61–1.24)
Chloride: 106 mmol/L (ref 101–111)
GFR calc Af Amer: 40 mL/min — ABNORMAL LOW (ref >60)
GFR calc non Af Amer: 34 mL/min — ABNORMAL LOW (ref >60)
GLUCOSE: 124 mg/dL — AB (ref 65–99)
Potassium: 4.8 mmol/L (ref 3.5–5.1)
Sodium: 135 mmol/L (ref 135–145)
TOTAL PROTEIN: 6.2 g/dL — AB (ref 6.5–8.1)

## 2016-04-09 LAB — LIPID PANEL
CHOL/HDL RATIO: 2.2 ratio
CHOLESTEROL: 85 mg/dL (ref 0–200)
HDL: 38 mg/dL — ABNORMAL LOW (ref >40)
LDL Cholesterol: 30 mg/dL (ref 0–99)
Triglycerides: 83 mg/dL (ref <150)
VLDL: 17 mg/dL (ref 0–40)

## 2016-04-09 NOTE — Progress Notes (Signed)
Occupational Therapy Evaluation Patient Details Name: Ricardo Mayer. MRN: [id number] DOB: [birth year] Today's Date: 04/09/2016    History of Present Illness 75 y.o. male with medical history significant of hypertension, chronic kidney disease stage III, type 2 diabetes, history of PE, presents to the emergency room chief complaint of syncopal episode today. Recent R TKR on 03/21/16.   Clinical Impression   Patient presents to OT at baseline for ADLs since R TKR. Reviewed ADL techniques and he will d/c home with wife who has been assisting him as needed with ADLs. No further OT needs at this time; will sign off.    Follow Up Recommendations  No OT follow up;Supervision/Assistance - 24 hour    Equipment Recommendations  None recommended by OT    Recommendations for Other Services PT consult     Precautions / Restrictions Precautions Precautions: Knee;Fall Restrictions Weight Bearing Restrictions: No Other Position/Activity Restrictions: WBAT      Mobility Bed Mobility Overal bed mobility: Modified Independent Bed Mobility: Supine to Sit           General bed mobility comments: increased time/effort  Transfers Overall transfer level: Needs assistance Equipment used: Rolling walker (2 wheeled) Transfers: Sit to/from Stand Sit to Stand: Min guard;Supervision         General transfer comment: cues initially for hand placement    Balance                                            ADL Overall ADL's : Needs assistance/impaired Eating/Feeding: Independent;Sitting   Grooming: Wash/dry hands;Supervision/safety;Standing           Upper Body Dressing : Set up;Sitting   Lower Body Dressing: Minimal assistance;Sit to/from stand   Toilet Transfer: Min guard;Ambulation;Comfort height toilet;Grab bars;RW   Toileting- Clothing Manipulation and Hygiene: Supervision/safety;Sit to/from stand       Functional mobility during ADLs: Min  guard;Supervision/safety;Rolling walker General ADL Comments: Patient able to reach to feet to adjust socks. Wife has been assisting with LB self-care occasionally. Patient has been taking a sponge bath since R TKR. Patient ambulated to/from bathroom with RW and performed toileting task without difficulty. Returned and sat up in recliner with nurse in room giving medication.     Vision     Perception     Praxis      Pertinent Vitals/Pain Pain Assessment: No/denies pain     Hand Dominance     Extremity/Trunk Assessment Upper Extremity Assessment Upper Extremity Assessment: Overall WFL for tasks assessed   Lower Extremity Assessment Lower Extremity Assessment: Defer to PT evaluation       Communication Communication Communication: No difficulties   Cognition Arousal/Alertness: Awake/alert Behavior During Therapy: WFL for tasks assessed/performed Overall Cognitive Status: Within Functional Limits for tasks assessed                     General Comments       Exercises       Shoulder Instructions      Home Living Family/patient expects to be discharged to:: Private residence Living Arrangements: Spouse/significant other Available Help at Discharge: Family;Available 24 hours/day Type of Home: House Home Access: Stairs to enter [employer] of Steps: 1 Entrance Stairs-Rails: None Home Layout: Two level;Able to live on main level with bedroom/bathroom Alternate Level Stairs-Number of Steps: spiral stairway, plans to stay downstairs   Bathroom  Shower/Tub: Occupational psychologist: Standard Bathroom Accessibility: Yes How Accessible: Accessible via walker Home Equipment: [place] - single point;Walker - standard;Bedside commode;Walker - 2 wheels          Prior Functioning/Environment Level of Independence: Independent with assistive device(s)        Comments: since R TKR    OT Diagnosis: Generalized weakness   OT Problem List:  Decreased strength;Decreased range of motion   OT Treatment/Interventions:      OT Goals(Current goals can be found in the care plan section) Acute Rehab OT Goals OT Goal Formulation: All assessment and education complete, DC therapy  OT Frequency:     Barriers to D/C:            Co-evaluation              End of Session Equipment Utilized During Treatment: Rolling walker Nurse Communication: Mobility status  Activity Tolerance: Patient tolerated treatment well Patient left: in chair;with call bell/phone within reach;with nursing/sitter in room;with family/visitor present   Time: [id number] OT Time Calculation (min): 15 min Charges:  OT General Charges $OT Visit: 1 Procedure OT Evaluation $OT Eval Low Complexity: 1 Procedure G-Codes: OT G-codes **NOT FOR INPATIENT CLASS** Functional Assessment Tool Used: clinical judgment Functional Limitation: Self care Self Care Current Status [id number]): At least 1 percent but less than 20 percent impaired, limited or restricted Self Care Goal Status [id number]): At least 1 percent but less than 20 percent impaired, limited or restricted Self Care Discharge Status [phone]): At least 1 percent but less than 20 percent impaired, limited or restricted  Andros Channing A 04/09/2016, 11:19 AM

## 2016-04-09 NOTE — Progress Notes (Signed)
Reviewed discharge information with patient and caregiver. Answered all questions. Patient/caregiver able to teach back medications and reasons to contact MD/911. Patient verbalizes importance of PCP follow up appointment.  Karesha Trzcinski M. Yalena Colon, RN  

## 2016-04-09 NOTE — Evaluation (Signed)
Physical Therapy Evaluation Patient Details Name: Ricardo Mayer. MRN: [id number] DOB: 30-Jul-1941 Today's Date: 04/09/2016   History of Present Illness  75 y.o. male with medical history significant of hypertension, chronic kidney disease stage III, type 2 diabetes, history of PE, presents to the emergency room chief complaint of syncopal episode today. Recent R TKR on 03/21/16.  Clinical Impression  Pt mobilizing at preadmission level with RW and with min c/o pain.  Pt plans follow up with OutPt PT at dc.    Follow Up Recommendations Outpatient PT    Equipment Recommendations  None recommended by PT    Recommendations for Other Services       Precautions / Restrictions Precautions Precautions: Knee;Fall Restrictions Weight Bearing Restrictions: No Other Position/Activity Restrictions: WBAT      Mobility  Bed Mobility Overal bed mobility: Modified Independent Bed Mobility: Supine to Sit           General bed mobility comments: increased time/effort  Transfers Overall transfer level: Needs assistance Equipment used: Rolling walker (2 wheeled) Transfers: Sit to/from Stand Sit to Stand: Min guard;Supervision         General transfer comment: cues initially for hand placement  Ambulation/Gait Ambulation/Gait assistance: Supervision;Modified independent (Device/Increase time) Ambulation Distance (Feet): 200 Feet Assistive device: Rolling walker (2 wheeled) Gait Pattern/deviations: Step-through pattern;Shuffle;Trunk flexed     General Gait Details: min cues for posture, position from RW, and pacing 2* noted SOB  Stairs            Wheelchair Mobility    Modified Rankin (Stroke Patients Only)       Balance Overall balance assessment: No apparent balance deficits (not formally assessed)                                           Pertinent Vitals/Pain Pain Assessment: No/denies pain Pain Location:  5/10 @ R knee with therex -  reports no pain at rest Pain Descriptors / Indicators: Sore Pain Intervention(s): Monitored during session    Home Living Family/patient expects to be discharged to:: Private residence Living Arrangements: Spouse/significant other Available Help at Discharge: Family;Available 24 hours/day Type of Home: House Home Access: Stairs to enter Entrance Stairs-Rails: None Entrance Stairs-Number of Steps: 1 Home Layout: Two level;Able to live on main level with bedroom/bathroom Home Equipment: Kasandra Knudsen - single point;Walker - standard;Bedside commode;Walker - 2 wheels      Prior Function Level of Independence: Independent with assistive device(s)         Comments: since R TKR     Hand Dominance        Extremity/Trunk Assessment   Upper Extremity Assessment: Overall WFL for tasks assessed           Lower Extremity Assessment: RLE deficits/detail RLE Deficits / Details: Quads ~4/5 with AAROM at knee -5 - 90       Communication   Communication: HOH  Cognition Arousal/Alertness: Awake/alert Behavior During Therapy: WFL for tasks assessed/performed Overall Cognitive Status: Within Functional Limits for tasks assessed                      General Comments      Exercises Total Joint Exercises Ankle Circles/Pumps: AROM;Both;10 reps Quad Sets: AROM;AAROM;Right;20 reps;Supine Heel Slides: AAROM;Right;Supine;Other reps (comment) (30 reps with increased hold at end range)      Assessment/Plan    PT Assessment  All further PT needs can be met in the next venue of care  PT Diagnosis Other (comment);Difficulty walking   PT Problem List Decreased strength;Decreased range of motion;Decreased activity tolerance;Decreased mobility;Obesity;Other (comment) (ROM)  PT Treatment Interventions Therapeutic exercise;Therapeutic activities;Functional mobility training;Patient/family education   PT Goals (Current goals can be found in the Care Plan section) Acute Rehab PT  Goals Patient Stated Goal: walk independently PT Goal Formulation: With patient Time For Goal Achievement: 04/12/16 Potential to Achieve Goals: Good    Frequency 7X/week   Barriers to discharge        Co-evaluation               End of Session Equipment Utilized During Treatment: Gait belt Activity Tolerance: Patient tolerated treatment well Patient left: in chair;with call bell/phone within reach;with family/visitor present Nurse Communication: Mobility status    Functional Assessment Tool Used: Clinical judgement Functional Limitation: Mobility: Walking and moving around Mobility: Walking and Moving Around Current Status [phone]): At least 1 percent but less than 20 percent impaired, limited or restricted Mobility: Walking and Moving Around Goal Status [phone]): At least 1 percent but less than 20 percent impaired, limited or restricted    Time: 1521-1550 PT Time Calculation (min) (ACUTE ONLY): 29 min   Charges:   PT Evaluation $PT Eval Low Complexity: 1 Procedure     PT G Codes:   PT G-Codes **NOT FOR INPATIENT CLASS** Functional Assessment Tool Used: Clinical judgement Functional Limitation: Mobility: Walking and moving around Mobility: Walking and Moving Around Current Status ([id number]): At least 1 percent but less than 20 percent impaired, limited or restricted Mobility: Walking and Moving Around Goal Status [phone]): At least 1 percent but less than 20 percent impaired, limited or restricted    Ricardo Mayer 04/09/2016, 5:06 PM

## 2016-04-09 NOTE — Discharge Instructions (Signed)

## 2016-04-09 NOTE — Discharge Summary (Signed)
Physician Discharge Summary  Ricardo Mayer. [id number] DOB: 01-19-1941 DOA: 04/08/2016  PCP: [name], MD  Admit date: 04/08/2016 Discharge date: 04/09/2016  Recommendations for Outpatient Follow-up:  1. 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 male with medical history significant of hypertension, chronic kidney disease stage III, type 2 diabetes, history of PE, presents to the emergency room chief complaint of syncopal episode today. Patient recently had a total knee arthroplasty on the right, and was supposed to see an orthopedic surgeon, however when he was about to get off of his car he felt like he is unable to, states that his "vision darkened like a curtain",felt extremely weak. His son, who is at bedside, states the patient passed out and so very short bursts of intermittent upper extremity jerking, similar to a seizure. Patient recovered quickly, there are no reported postictal symptoms. Ever since he had his surgery on his knee done couple weeks ago, patient states that he has been having intermittent presyncopal episodes where he felt something was coming and then to sit down right away. He has a history of similar symptoms last December/January of this year, he was extensively worked up and he was found to have PE. He was started on Xarelto at that time and has been taking it since, except for a period for about 7 days about 2 weeks ago when he had his knee replacement. He denies any fever or  chills, he denies any chest pains or palpitations. He has no abdominal pain, no nausea, vomiting or diarrhea  In the emergency room, his vital signs are stable, he is normotensive and satting well on room air, his blood work is remarkable for slightly elevated creatinine than his baseline at 2.32, Urinalysis is unremarkable, he underwent VQ scan out of concern for recurrent PE which was negative. He also had a venous ultrasound of the lower extremities which showed persistent right lower extremity DVT in the femoral and popliteal veins with early recanalization. No DVT on left. Of note, CT scan of the brain showed artifact versus age-indeterminate lacunar infarct in the left side of the pons.:  Hospital Course:  Syncopal episode / TIA - Unclear etiology at this point - EEG normal - MRI without acute findings - PT recommends outpt PT   Acute on chronic renal failure - Has gotten IV fluids on admission - Cr improved since admission, 1.8 prior to discharge   Recent diagnosis of DVT and PE - Continue Xarelto, VQ scan is negative for recurrent PE   Status post right TKA  - HH PT ordered   Hypertension - Resume home med  Type 2 diabetes mellitus - Resume home meds   Signed:  Leisa Lenz, MD  [facility] 04/09/2016, 3:56 PM  Pager #: [phone]  Time spent in minutes: more than 30 minutes   Discharge Exam: Vitals:   04/08/16 2055 04/09/16 0646  BP: (!) 154/75 140/66  Pulse: 94 92  Resp: 18 18  Temp: 98 F (36.7 C) 97.9 F (36.6 C)   Vitals:   04/08/16 1453 04/08/16 1533 04/08/16 2055 04/09/16 0646  BP:  138/66 (!) 154/75 140/66  Pulse:  82 94 92  Resp:  18 18 18   Temp: 97.8 F (36.6 C) 98.7 F (37.1 C) 98 F (36.7 C) 97.9 F (36.6 C)  TempSrc: Oral Oral Oral Oral  SpO2:  100% 99% 99%  Weight:      Height:        General: Pt is alert, follows commands appropriately, not in acute distress Cardiovascular: Regular rate and rhythm, S1/S2  + Respiratory: Clear to auscultation bilaterally, no wheezing, no crackles, no rhonchi Abdominal: Soft, non tender, non distended, bowel sounds +, no guarding Extremities: no edema, no cyanosis, pulses palpable bilaterally DP and PT Neuro: Grossly nonfocal  Discharge Instructions  Discharge Instructions    Call MD for:  persistant nausea and vomiting    Complete by:  As directed   Call MD for:  severe uncontrolled pain    Complete by:  As directed   Diet - low sodium heart healthy    Complete by:  As directed   Increase activity slowly    Complete by:  As directed       Medication List    STOP taking these medications   docusate sodium 100 MG capsule Commonly known as:  COLACE   doxycycline 100 MG capsule Commonly known as:  VIBRAMYCIN   HYDROcodone-acetaminophen 7.5-325 MG tablet Commonly known as:  NORCO     TAKE these medications   amLODipine-valsartan 10-320 MG tablet Commonly known as:  EXFORGE TAKE 1 TABLET BY MOUTH DAILY.   fenofibrate 160 MG tablet TAKE 1 TABLET BY MOUTH DAILY What changed:  See the new instructions.   ferrous sulfate 325 (65 FE) MG tablet Take 1 tablet (325 mg total) by mouth 3 (three) times daily after meals.   furosemide 20 MG tablet Commonly known as:  LASIX Take 1 tablet (20 mg total) by mouth daily. What changed:  when to take this  reasons to take this   glucose blood test strip Commonly known as:  ONE TOUCH ULTRA TEST USE FOR TESTING SUGAR 2 to 3 TIMES DAILY E11.9   MYRBETRIQ 50 MG Tb24 tablet Generic drug:  mirabegron ER Take 50 mg by mouth daily with breakfast.   polyethylene glycol packet Commonly known as:  MIRALAX / GLYCOLAX Take 17 g by mouth 2 (two) times daily. What changed:  when to take this  reasons to take this   rivaroxaban 20 MG Tabs tablet Commonly known as:  XARELTO Take one tablet by mouth daily with food What changed:  how much to take  how to take this  when to take this  additional  instructions   sitaGLIPtin 50 MG tablet Commonly known as:  JANUVIA TAKE 1 TABLET (50 MG TOTAL) BY MOUTH DAILY.   tiZANidine 4 MG tablet Commonly known as:  ZANAFLEX Take 1 tablet (4 mg total) by mouth every 6 (six) hours as needed for muscle spasms.   traMADol 50 MG tablet Commonly known as:  ULTRAM Take 50-100 mg by mouth every 8 (eight) hours as needed for moderate pain.   vitamin B-12 1000 MCG tablet Commonly known as:  CYANOCOBALAMIN Take 1,000 mcg by mouth daily.      Follow-up Information    [place] DEPT.   Specialty:  Emergency Medicine Why:  If symptoms worsen Contact information: [address]  [id number] [place] [id number] [phone]           The results of significant diagnostics from this hospitalization (including imaging, microbiology, ancillary and laboratory) are listed below for reference.    Significant Diagnostic Studies: Ct Head Wo Contrast  Result Date: 04/08/2016 CLINICAL DATA:  Dizziness. Lightheadedness. Seizure versus syncope. EXAM: CT HEAD WITHOUT CONTRAST TECHNIQUE: Contiguous axial images were obtained from the base of the skull through the vertex without intravenous contrast. COMPARISON:  None. FINDINGS: Brain: Mild cerebral atrophy. Minimal low density in the periventricular white matter likely related to small vessel disease. Artifact versus age-indeterminate lacunar infarct in the left this side of the pons, including on image 10/series 3. Otherwise, no mass lesion, hemorrhage, hydrocephalus, acute infarct, intra-axial, or extra-axial fluid collection. Vascular: Bilateral atherosclerosis in the vertebral arteries and intracranial carotid arteries. Skull: Normal Sinuses/Orbits: Normal orbits and globes. Clear paranasal sinuses and mastoid air cells. Other: None IMPRESSION: 1. 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 2.  Cerebral atrophy and small  vessel ischemic change. Electronically Signed   By: Abigail Miyamoto M.D.   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 Electronically Signed   By: Elon Alas M.D.   On: 04/08/2016 23:01   Result Date: 04/08/2016 CLINICAL DATA:  Syncopal episode today while getting out of car to see orthopedics, status post recent arthroplasty. Intermittent pre syncopal episodes for a many months, diagnosed with pulmonary embolism. History of hypertension, diabetes, chronic kidney disease. EXAM: MRI HEAD WITHOUT CONTRAST MRA HEAD WITHOUT CONTRAST TECHNIQUE: Multiplanar, multiecho pulse sequences of the brain and surrounding structures were obtained without intravenous contrast. Angiographic images of the head were obtained using MRA technique without contrast. COMPARISON:  CT HEAD April 08, 2016 FINDINGS: MRI HEAD FINDINGS INTRACRANIAL CONTENTS: No reduced diffusion to suggest acute ischemia. Nonspecific punctate focus of susceptibility artifact LEFT external capsule. The ventricles and sulci are normal for patient's age. Patchy supratentorial pontine (including LEFT pons corresponding to CT abnormality) white matter FLAIR T2 hyperintensities compatible with chronic small vessel ischemic disease. No suspicious parenchymal signal, masses or mass effect. No abnormal extra-axial fluid collections. No extra-axial masses though, contrast enhanced sequences would be more sensitive. Normal major intracranial vascular flow voids present at skull base. ORBITS: The included ocular globes and orbital contents are non-suspicious. SINUSES: The mastoid air-cells and included paranasal sinuses are well-aerated. 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 No abnormal sellar expansion. No suspicious  calvarial bone marrow signal. Craniocervical junction maintained. MRA HEAD FINDINGS ANTERIOR CIRCULATION: Normal flow related enhancement of the included cervical, petrous, cavernous and supraclinoid internal carotid arteries. Patent anterior communicating artery. Normal flow related enhancement of the anterior and middle cerebral arteries, including distal segments. No large vessel occlusion, high-grade stenosis, abnormal luminal irregularity, aneurysm. POSTERIOR CIRCULATION: LEFT vertebral artery is dominant. Basilar artery is patent, with normal flow related enhancement of the main branch vessels. Normal flow related enhancement of the posterior cerebral arteries. No large vessel occlusion, high-grade stenosis, abnormal luminal irregularity, aneurysm. IMPRESSION: MRI HEAD: No acute intracranial process. Involutional changes. Moderate chronic small vessel ischemic disease, including LEFT pons corresponding to CT finding. MRA HEAD: Negative. Electronically Signed: By: Elon Alas M.D. On: 04/08/2016 22:24   Nm Pulmonary  Perf And Vent  Result Date: 04/08/2016 CLINICAL DATA:  RIGHT knee replacement 03/21/2016. Syncopal episode. EXAM: NUCLEAR MEDICINE VENTILATION - PERFUSION LUNG SCAN TECHNIQUE: Ventilation images were obtained in multiple projections using inhaled aerosol Tc-57m DTPA. Perfusion images were obtained in multiple projections after intravenous injection of Tc-28m MAA. RADIOPHARMACEUTICALS:  32.6 mCi Technetium-52m DTPA aerosol inhalation and 4.2 mCi Technetium-27m MAA IV COMPARISON:  None FINDINGS: Ventilation: No focal ventilation defect. Perfusion: No wedge shaped peripheral perfusion defects to suggest acute pulmonary embolism. IMPRESSION: No evidence of acute pulmonary embolism. Electronically Signed   By: Suzy Bouchard M.D.   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 Electronically Signed   By: Elon Alas M.D.   On: 04/08/2016 23:01   Result Date: 04/08/2016 CLINICAL DATA:  Syncopal episode today while getting out of car to see orthopedics, status post recent arthroplasty. Intermittent pre syncopal episodes for a many months, diagnosed with pulmonary embolism. History of hypertension, diabetes, chronic kidney disease. EXAM: MRI HEAD WITHOUT CONTRAST MRA HEAD WITHOUT CONTRAST TECHNIQUE: Multiplanar, multiecho pulse sequences of the brain and surrounding structures were obtained without intravenous contrast. Angiographic images of the head were obtained using MRA technique without contrast. COMPARISON:  CT HEAD April 08, 2016 FINDINGS: MRI HEAD FINDINGS INTRACRANIAL CONTENTS: No reduced diffusion to suggest acute ischemia. Nonspecific punctate focus of susceptibility artifact LEFT external capsule. The ventricles and sulci are normal for patient's age. Patchy supratentorial pontine (including LEFT pons corresponding to CT abnormality) white matter FLAIR T2 hyperintensities compatible with chronic small vessel ischemic disease. No suspicious parenchymal signal, masses or mass effect. No abnormal extra-axial fluid collections. No extra-axial masses though, contrast enhanced sequences would be more sensitive. Normal major intracranial vascular flow voids present at skull base. ORBITS: The included ocular globes and orbital contents are non-suspicious. SINUSES: The mastoid air-cells and included paranasal sinuses are well-aerated. 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 No abnormal sellar expansion. No suspicious calvarial bone marrow signal. Craniocervical junction maintained. MRA HEAD FINDINGS ANTERIOR CIRCULATION: Normal flow related enhancement of the included cervical, petrous, cavernous and supraclinoid internal carotid arteries.  Patent anterior communicating artery. Normal flow related enhancement of the anterior and middle cerebral arteries, including distal segments. No large vessel occlusion, high-grade stenosis, abnormal luminal irregularity, aneurysm. POSTERIOR CIRCULATION: LEFT vertebral artery is dominant. Basilar artery is patent, with normal flow related enhancement of the main branch vessels. Normal flow related enhancement of the posterior cerebral arteries. No large vessel occlusion, high-grade stenosis, abnormal luminal irregularity, aneurysm. IMPRESSION: MRI HEAD: No acute intracranial process. Involutional changes. Moderate chronic small vessel ischemic disease, including LEFT pons corresponding to CT finding. MRA HEAD: Negative. Electronically Signed: By: Elon Alas M.D. On: 04/08/2016 22:24    Microbiology: No results found for this or any previous visit (from the past 240 hour(s)).   Labs: Basic Metabolic Panel:  Recent Labs Lab 04/08/16 0838 04/09/16 0455  NA 132* 135  K 4.6 4.8  CL 104 106  CO2 20* 23  GLUCOSE 175* 124*  BUN 44* 37*  CREATININE 2.32* 1.83*  CALCIUM 9.5 9.3   Liver Function Tests:  Recent Labs Lab 04/08/16 0838 04/09/16 0455  AST 21 18  ALT 17 15*  ALKPHOS 64 60  BILITOT 0.8 1.0  PROT 6.6 6.2*  ALBUMIN 3.8 3.5   No results for input(s): LIPASE, AMYLASE in the last 168 hours. No results for input(s): AMMONIA in the last 168 hours. CBC:  Recent Labs Lab 04/08/16 0838 04/09/16 0455  WBC 11.0* 10.0  NEUTROABS 7.9*  --   HGB 10.9* 10.4*  HCT 32.2* 30.4*  MCV 84.7 83.3  PLT 362 297   Cardiac Enzymes: No results for input(s): CKTOTAL, CKMB, CKMBINDEX, TROPONINI in the last 168 hours. BNP: BNP (last 3 results)  Recent Labs  05/29/15 1115  BNP 105.2*    ProBNP (last 3 results) No results for input(s): PROBNP in the last 8760 hours.  CBG:  Recent Labs Lab 04/08/16 0837  GLUCAP 176*

## 2016-04-09 NOTE — Evaluation (Signed)
SLP Cancellation Note  Patient Details Name: Ricardo Mayer. MRN: [id number] DOB: 05/22/1941   Cancelled treatment:       Reason Eval/Treat Not Completed: SLP screened, no needs identified, will sign off (family and pt report he is at baseline re: cognitive linguistic function )   Claudie Fisherman, [facility] SLP [phone]

## 2016-04-10 LAB — HEMOGLOBIN A1C
Hgb A1c MFr Bld: 5.7 % — ABNORMAL HIGH (ref 4.8–5.6)
MEAN PLASMA GLUCOSE: 117 mg/dL

## 2016-04-11 ENCOUNTER — Ambulatory Visit: Payer: Medicare Other | Admitting: Physical Therapy

## 2016-04-11 ENCOUNTER — Encounter: Payer: Self-pay | Admitting: Physical Therapy

## 2016-04-11 ENCOUNTER — Telehealth: Payer: Self-pay | Admitting: *Deleted

## 2016-04-11 DIAGNOSIS — R2689 Other abnormalities of gait and mobility: Secondary | ICD-10-CM | POA: Diagnosis not present

## 2016-04-11 DIAGNOSIS — M25661 Stiffness of right knee, not elsewhere classified: Secondary | ICD-10-CM | POA: Diagnosis not present

## 2016-04-11 DIAGNOSIS — M25561 Pain in right knee: Secondary | ICD-10-CM

## 2016-04-11 DIAGNOSIS — R55 Syncope and collapse: Secondary | ICD-10-CM

## 2016-04-11 NOTE — Therapy (Signed)
[facility] [place], [place], [address] Phone: [phone]   Fax:  [phone]  Physical Therapy Treatment  Patient Details  Name: Ricardo Mayer. MRN: [id number] Date of Birth: March 08, 1941 Referring Provider: Paralee Cancel, MD  Encounter Date: 04/11/2016      PT End of Session - 04/11/16 1306    Visit Number 7   Number of Visits 18   Date for PT Re-Evaluation 05/05/16   PT Start Time 1229   PT Stop Time 1324   PT Time Calculation (min) 55 min   Activity Tolerance Patient tolerated treatment well   Behavior During Therapy [facility] for tasks assessed/performed      Past Medical History:  Diagnosis Date  . Adenomatous polyp of colon   . Arthritis    bilateral knees  . CKD (chronic kidney disease), stage II    Dr. Patel,nephrology "stable last 3 yrs"  . Diabetes mellitus without complication ([place])    Dr. Laurance Flatten.PCP  . History of basal cell cancer   . History of DVT of lower extremity    2011--  LEFT LEG  . History of kidney stones   . History of melanoma excision    RIGHT ARM  . Hyperlipidemia   . Hypertension   . Impaired hearing    hearing aids bilateral  . Melanoma ([place]) 1990   arm and back -Melanoma, basal cell others- Dr. Edgardo Roys follows.  . Near syncope 08/25/2015   Dr. Roland Rack once"found blood clots in lung"  . Nocturia   . Prostate cancer [facility]) 2014   Dr. Cherylann Parr- "seed implant" '14  . Ulcer of left lower leg ([place])    recent pcp visit dr Laurance Flatten--  ordered betadine wet/dry dsg daily (03-18-2013)  [place] IN 1 WEEK    Past Surgical History:  Procedure Laterality Date  . KIDNEY STONE EXTRACTIONS  LAST ONE 1970   X2 OPEN/  X1  URETEROSCOPY  . MELANOMA EXCISION RIGHT ARM  1990  . PROSTATE BIOPSY    . RADIOACTIVE SEED IMPLANT N/A 03/21/2013   Procedure: RADIOACTIVE SEED IMPLANT;  Surgeon: Malka So, MD;  Location: [facility];  Service: Urology;   Laterality: N/A;  87 seeds implanted    . TONSILLECTOMY    . TOTAL KNEE ARTHROPLASTY Right 03/21/2016   Procedure: RIGHT TOTAL KNEE ARTHROPLASTY;  Surgeon: Paralee Cancel, MD;  Location: [facility];  Service: Orthopedics;  Laterality: Right;    There were no vitals filed for this visit.      Subjective Assessment - 04/11/16 1236    Subjective Patient reported some soreness and stiffness and reported an episode where he passed out at the Dr. appt and went to hospital and they said he was dehidrated and sent him home. Some dizzy spells ongoing since surgury.   Pertinent History CKD, DM, hx basal cell cancer, HLD, HTN, DVT/PE   Limitations Standing;Walking   How long can you stand comfortably? 2-3 min   How long can you walk comfortably? 3-4 min   Patient Stated Goals improve motion and mobility, decrease pain   Currently in Pain? Yes   Pain Score 5    Pain Location Knee   Pain Orientation Right   Pain Descriptors / Indicators Aching   Pain Onset 1 to 4 weeks ago   Pain Frequency Intermittent   Aggravating Factors  increased activity   Pain Relieving Factors at rest and meds  [facility] PT Assessment - 04/11/16 0001      ROM / Strength   AROM / PROM / Strength PROM;AROM     AROM   AROM Assessment Site Knee   Right/Left Knee Right   Right Knee Extension -8   Right Knee Flexion 90     PROM   PROM Assessment Site Knee   Right/Left Knee Right   Right Knee Extension -4   Right Knee Flexion 99                     [facility] Adult PT Treatment/Exercise - 04/11/16 0001      Knee/Hip Exercises: Aerobic   Nustep x17 L4 UE/LE monitored for progression     Cryotherapy   Number Minutes Cryotherapy 15 Minutes   Cryotherapy Location Knee   Type of Cryotherapy Ice pack     Manual Therapy   Manual Therapy Passive ROM   Passive ROM gentle PROM for right knee flexion and ext with low load holds                PT Education - 04/11/16 1247    Education provided  Yes   Education Details HEP   Person(s) Educated Patient   Methods Explanation;Demonstration;Handout   Comprehension Verbalized understanding;Returned demonstration          PT Short Term Goals - 04/11/16 1246      PT SHORT TERM GOAL #1   Title independent with HEP (04/14/16)   Time 3   Period Weeks   Status Achieved           PT Long Term Goals - 04/11/16 1304      PT LONG TERM GOAL #1   Title improve R knee AROM 0-95 degrees for improved function (05/05/16)   Time 6   Period Weeks   Status On-going  AROM -8 to 90 degrees 04/11/16     PT LONG TERM GOAL #2   Title ambulate > 200' without device independently for improved function (05/05/16)   Time 6   Period Weeks   Status On-going  patient using walker at this time 04/11/16     PT LONG TERM GOAL #3   Title report pain < 2/10 with ADLs for improved function (05/05/16)   Time 6   Period Weeks   Status On-going  patient not performing his normal ADL's at this time 04/11/16     PT LONG TERM GOAL #4   Title tolerate standing and walking > 10 min without increase in pain for improved function (05/05/16)   Time 6   Period Weeks   Status On-going  patient has not been able to stand this amount of time thus far 04/11/16               Plan - 04/11/16 1307    Clinical Impression Statement Patient progressing slowly due to passing out last friday then was hospitalized for dehidration then relased. Patient continues to use FWW and has 0 up to 5/10 with ROM or prolong activity. Patient was able to tolerate treatment well today. Today focused on right knee ROM and inital HEP for self stretching to improve ROM and functional independence. Patient able to meet STG #1 and all LTG's ongoing at this time due to ROM, pain and edema deficts. Educated patient on seated stretches if unable to perform standing due to his ongoing dizzy spells.    Rehab Potential Good   PT Frequency 3x / week   PT  Duration 6 weeks   PT  Treatment/Interventions ADLs/Self Care Home Management;Cryotherapy;Electrical Stimulation;Functional mobility training;Stair training;Gait training;DME Instruction;Moist Heat;Therapeutic activities;Therapeutic exercise;Balance training;Neuromuscular re-education;Patient/family education;Passive range of motion;Scar mobilization;Manual techniques   PT Next Visit Plan No vasopneumatic.    RT TKR Rehab     Pt to F/U with MD tomorrow. MD note sent   Consulted and Agree with Plan of Care Patient      Patient will benefit from skilled therapeutic intervention in order to improve the following deficits and impairments:  Abnormal gait, Cardiopulmonary status limiting activity, Decreased range of motion, Difficulty walking, Pain, Decreased endurance, Decreased activity tolerance, Decreased skin integrity, Impaired flexibility, Decreased scar mobility, Decreased mobility, Decreased strength, Increased edema  Visit Diagnosis: Pain in right knee  Stiffness of right knee, not elsewhere classified  Other abnormalities of gait and mobility     Problem List Patient Active Problem List   Diagnosis Date Noted  . Anemia of chronic disease   . LOC (loss of consciousness) 04/08/2016  . TIA (transient ischemic attack) 04/08/2016  . 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 S/P knee replacement 03/21/2016  . Anemia associated with chronic renal failure 10/28/2015  . Hyperparathyroidism , secondary, non-renal ([place]) 10/28/2015  . Pulmonary emboli ([place]) 08/27/2015  . Near syncope 08/25/2015  . DOE (dyspnea on exertion) 06/02/2015  . Controlled type 2 diabetes mellitus ([place]) 06/02/2015  . Melanoma, history of 10/09/2013  . 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 Chronic kidney disease, stage III (moderate) 10/09/2013  . Venous (peripheral) insufficiency 10/09/2013  . Statin intolerance 10/09/2013  . Prostate cancer ([place]) 12/25/2012    Ricardo Mayer P , PTA 04/11/2016, 1:34  PM  Ladean Raya, PTA 04/11/16 1:34 PM   [facility] [address] [place], [place], [address] Phone: [phone]   Fax:  [phone]  Name: Ricardo Mayer. MRN: [id number] Date of Birth: 1941/06/30

## 2016-04-11 NOTE — Telephone Encounter (Signed)
Patient's wife called stating that patient was in the hospital over the weekend for dizziness and seizure like activities.

## 2016-04-11 NOTE — Addendum Note (Signed)
Addended by: Zannie Cove on: 04/11/2016 02:10 PM   Modules accepted: Orders

## 2016-04-11 NOTE — Patient Instructions (Signed)
    With rolled towel under right ankle, place _1-5___ pound weight across knee. Hold __5+__ minutes. Repeat __2-3__ times per set. Do __2__ sets per session. Do __2-4__ sessions per day.  Sitting knee extension stretch    Place one foot on table. Straighten leg and attempt to keep it straight, then push down until feel a stretch. Hold _30__ seconds. Repeat __5-10_ times each leg, alternating. Do _2-4__ sessions per day.      Knee Flexion Stretch on Step  Place foot on step and lean forward until you feel a good stretch in front of knee.   hold 30 sec x 5-10 perform 2-4 x daily  Chair Knee Flexion   Keeping feet on floor, slide foot of operated leg back, bending knee. Hold ___30_ seconds. Repeat __5__ times. Do __2-4__ sessions a day.  Heel Slide   Bend left knee and pull heel toward buttocks. Use strap around foot and pull strap with arms to assist knee to bend further. Hold 10 secs.  Repeat ____ times. Do ____ sessions per day.

## 2016-04-12 DIAGNOSIS — Z471 Aftercare following joint replacement surgery: Secondary | ICD-10-CM | POA: Diagnosis not present

## 2016-04-12 DIAGNOSIS — Z96651 Presence of right artificial knee joint: Secondary | ICD-10-CM | POA: Diagnosis not present

## 2016-04-13 ENCOUNTER — Encounter: Payer: Self-pay | Admitting: Physical Therapy

## 2016-04-13 ENCOUNTER — Ambulatory Visit: Payer: Medicare Other | Admitting: Physical Therapy

## 2016-04-13 DIAGNOSIS — M25661 Stiffness of right knee, not elsewhere classified: Secondary | ICD-10-CM | POA: Diagnosis not present

## 2016-04-13 DIAGNOSIS — M25561 Pain in right knee: Secondary | ICD-10-CM

## 2016-04-13 DIAGNOSIS — R2689 Other abnormalities of gait and mobility: Secondary | ICD-10-CM | POA: Diagnosis not present

## 2016-04-13 NOTE — Therapy (Signed)
[facility] [place], [place], [address] Phone: [phone]   Fax:  [phone]  Physical Therapy Treatment  Patient Details  Name: Ricardo Mayer. MRN: [id number] Date of Birth: February 05, 1941 Referring Provider: Paralee Cancel, MD  Encounter Date: 04/13/2016      PT End of Session - 04/13/16 1021    Visit Number 8   Number of Visits 18   Date for PT Re-Evaluation 05/05/16   PT Start Time 0940   PT Stop Time 1038   PT Time Calculation (min) 58 min   Activity Tolerance Patient tolerated treatment well   Behavior During Therapy [facility] [facility] for tasks assessed/performed      Past Medical History:  Diagnosis Date  . Adenomatous polyp of colon   . Arthritis    bilateral knees  . CKD (chronic kidney disease), stage II    Dr. Patel,nephrology "stable last 3 yrs"  . Diabetes mellitus without complication ([place])    Dr. Laurance Flatten.PCP  . History of basal cell cancer   . History of DVT of lower extremity    2011--  LEFT LEG  . History of kidney stones   . History of melanoma excision    RIGHT ARM  . Hyperlipidemia   . Hypertension   . Impaired hearing    hearing aids bilateral  . Melanoma ([place]) 1990   arm and back -Melanoma, basal cell others- Dr. Edgardo Roys follows.  . Near syncope 08/25/2015   Dr. Roland Rack once"found blood clots in lung"  . Nocturia   . Prostate cancer [facility]) 2014   Dr. Cherylann Parr- "seed implant" '14  . Ulcer of left lower leg ([place])    recent pcp visit dr Laurance Flatten--  ordered betadine wet/dry dsg daily (03-18-2013)  [place] IN 1 WEEK    Past Surgical History:  Procedure Laterality Date  . KIDNEY STONE EXTRACTIONS  LAST ONE 1970   X2 OPEN/  X1  URETEROSCOPY  . MELANOMA EXCISION RIGHT ARM  1990  . PROSTATE BIOPSY    . RADIOACTIVE SEED IMPLANT N/A 03/21/2013   Procedure: RADIOACTIVE SEED IMPLANT;  Surgeon: Malka So, MD;  Location: [facility];  Service: Urology;   Laterality: N/A;  87 seeds implanted    . TONSILLECTOMY    . TOTAL KNEE ARTHROPLASTY Right 03/21/2016   Procedure: RIGHT TOTAL KNEE ARTHROPLASTY;  Surgeon: Paralee Cancel, MD;  Location[facility];  Service: Orthopedics;  Laterality: Right;    There were no vitals filed for this visit.      Subjective Assessment - 04/13/16 0943    Subjective Patient went to MD and had bandage taken off, knee healing good per MD, need to bend and straighten knee more per MD   Pertinent History CKD, DM, hx basal cell cancer, HLD, HTN, DVT/PE   Limitations Standing;Walking   How long can you stand comfortably? 2-3 min   How long can you walk comfortably? 3-4 min   Patient Stated Goals improve motion and mobility, decrease pain   Currently in Pain? Yes   Pain Score 5    Pain Location Knee   Pain Orientation Right   Pain Descriptors / Indicators Aching   Pain Type Surgical pain   Pain Onset 1 to 4 weeks ago   Pain Frequency Intermittent   Aggravating Factors  increased activity   Pain Relieving Factors at rest            [facility] PT Assessment - 04/13/16 0001  AROM   Right Knee Extension -8   Right Knee Flexion 90     PROM   Right Knee Extension -5   Right Knee Flexion 100                     [facility] Adult PT Treatment/Exercise - 04/13/16 0001      Knee/Hip Exercises: Stretches   Knee: Self-Stretch to increase Flexion Right;3 reps;30 seconds     Knee/Hip Exercises: Aerobic   Nustep 23min L4 adjusted seat forward for ROM     Knee/Hip Exercises: Standing   Rocker Board 2 minutes     Modalities   Modalities Electrical Stimulation;Cryotherapy     Cryotherapy   Number Minutes Cryotherapy 15 Minutes   Cryotherapy Location Knee   Type of Cryotherapy Ice pack     Electrical Stimulation   Electrical Stimulation Location right knee   Electrical Stimulation Action IFC   Electrical Stimulation Parameters 1-10hz    Electrical Stimulation Goals Pain;Edema     Vasopneumatic    Number Minutes Vasopneumatic  --   Vasopnuematic Location  --   Vasopneumatic Pressure --   Vasopneumatic Temperature  --     Manual Therapy   Manual Therapy Passive ROM   Passive ROM gentle PROM for right knee flexion and ext with low load holds                  PT Short Term Goals - 04/11/16 1246      PT SHORT TERM GOAL #1   Title independent with HEP (04/14/16)   Time 3   Period Weeks   Status Achieved           PT Long Term Goals - 04/11/16 1304      PT LONG TERM GOAL #1   Title improve R knee AROM 0-95 degrees for improved function (05/05/16)   Time 6   Period Weeks   Status On-going  AROM -8 to 90 degrees 04/11/16     PT LONG TERM GOAL #2   Title ambulate > 200' without device independently for improved function (05/05/16)   Time 6   Period Weeks   Status On-going  patient using walker at this time 04/11/16     PT LONG TERM GOAL #3   Title report pain < 2/10 with ADLs for improved function (05/05/16)   Time 6   Period Weeks   Status On-going  patient not performing his normal ADL's at this time 04/11/16     PT LONG TERM GOAL #4   Title tolerate standing and walking > 10 min without increase in pain for improved function (05/05/16)   Time 6   Period Weeks   Status On-going  patient has not been able to stand this amount of time thus far 04/11/16               Plan - 04/13/16 1022    Clinical Impression Statement Patient progressing slowly due to stiffness and edema in right knee. Today focused on ROM to improve range for right knee flexion and ext for functional independence. Educated patient on continued self ROM exercises at home daily. Started ES per MPT today. Patient tolerated treatment well today and responded well with modalities today. Normal response and skin intact after removal of modalities. Goals ongoing at this time due to edema, ROM, and strength deficits.   Rehab Potential Good   PT Frequency 3x / week   PT Duration 6 weeks  PT Treatment/Interventions ADLs/Self Care Home Management;Cryotherapy;Electrical Stimulation;Functional mobility training;Stair training;Gait training;DME Instruction;Moist Heat;Therapeutic activities;Therapeutic exercise;Balance training;Neuromuscular re-education;Patient/family education;Passive range of motion;Scar mobilization;Manual techniques   PT Next Visit Plan cont with POC per TKR / NO VASOPNEUMATIC   Consulted and Agree with Plan of Care Patient      Patient will benefit from skilled therapeutic intervention in order to improve the following deficits and impairments:  Abnormal gait, Cardiopulmonary status limiting activity, Decreased range of motion, Difficulty walking, Pain, Decreased endurance, Decreased activity tolerance, Decreased skin integrity, Impaired flexibility, Decreased scar mobility, Decreased mobility, Decreased strength, Increased edema  Visit Diagnosis: Pain in right knee  Stiffness of right knee, not elsewhere classified  Other abnormalities of gait and mobility     Problem List Patient Active Problem List   Diagnosis Date Noted  . Anemia of chronic disease   . LOC (loss of consciousness) 04/08/2016  . TIA (transient ischemic attack) 04/08/2016  . 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 S/P knee replacement 03/21/2016  . Anemia associated with chronic renal failure 10/28/2015  . Hyperparathyroidism , secondary, non-renal ([place]) 10/28/2015  . Pulmonary emboli ([place]) 08/27/2015  . Near syncope 08/25/2015  . DOE (dyspnea on exertion) 06/02/2015  . Controlled type 2 diabetes mellitus ([place]) 06/02/2015  . Melanoma, history of 10/09/2013  . 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 Chronic kidney disease, stage III (moderate) 10/09/2013  . Venous (peripheral) insufficiency 10/09/2013  . Statin intolerance 10/09/2013  . Prostate cancer ([place]) 12/25/2012    Aum Caggiano P, PTA 04/13/2016, 10:42 AM  [facility] [place], [place], [address] Phone: [phone]   Fax:  [phone]  Name: Ricardo Mayer. MRN: [id number] Date of Birth: Mar 16, 1941

## 2016-04-15 ENCOUNTER — Encounter: Payer: Self-pay | Admitting: Physical Therapy

## 2016-04-15 ENCOUNTER — Ambulatory Visit: Payer: Medicare Other | Attending: Orthopedic Surgery | Admitting: Physical Therapy

## 2016-04-15 DIAGNOSIS — M25561 Pain in right knee: Secondary | ICD-10-CM | POA: Diagnosis not present

## 2016-04-15 DIAGNOSIS — M25661 Stiffness of right knee, not elsewhere classified: Secondary | ICD-10-CM

## 2016-04-15 DIAGNOSIS — R2689 Other abnormalities of gait and mobility: Secondary | ICD-10-CM

## 2016-04-15 NOTE — Therapy (Signed)
[facility] [place], [place], [address] Phone: [phone]   Fax:  [phone]  Physical Therapy Treatment  Patient Details  Name: Ricardo Mayer. MRN: [id number] Date of Birth: 08-Mar-1941 Referring Provider: Paralee Cancel, MD  Encounter Date: 04/15/2016      PT End of Session - 04/15/16 0948    Visit Number 9   Number of Visits 18   Date for PT Re-Evaluation 05/05/16   PT Start Time 0946   PT Stop Time 1041   PT Time Calculation (min) 55 min   Activity Tolerance Patient tolerated treatment well   Behavior During Therapy [facility] for tasks assessed/performed      Past Medical History:  Diagnosis Date  . Adenomatous polyp of colon   . Arthritis    bilateral knees  . CKD (chronic kidney disease), stage II    Dr. Patel,nephrology "stable last 3 yrs"  . Diabetes mellitus without complication ([place])    Dr. Laurance Flatten.PCP  . History of basal cell cancer   . History of DVT of lower extremity    2011--  LEFT LEG  . History of kidney stones   . History of melanoma excision    RIGHT ARM  . Hyperlipidemia   . Hypertension   . Impaired hearing    hearing aids bilateral  . Melanoma ([place]) 1990   arm and back -Melanoma, basal cell others- Dr. Edgardo Roys follows.  . Near syncope 08/25/2015   Dr. Roland Rack once"found blood clots in lung"  . Nocturia   . Prostate cancer [facility]) 2014   Dr. Cherylann Parr- "seed implant" '14  . Ulcer of left lower leg ([place])    recent pcp visit dr Laurance Flatten--  ordered betadine wet/dry dsg daily (03-18-2013)  [place] IN 1 WEEK    Past Surgical History:  Procedure Laterality Date  . KIDNEY STONE EXTRACTIONS  LAST ONE 1970   X2 OPEN/  X1  URETEROSCOPY  . MELANOMA EXCISION RIGHT ARM  1990  . PROSTATE BIOPSY    . RADIOACTIVE SEED IMPLANT N/A 03/21/2013   Procedure: RADIOACTIVE SEED IMPLANT;  Surgeon: Malka So, MD;  Location: [facility];  Service: Urology;   Laterality: N/A;  87 seeds implanted    . TONSILLECTOMY    . TOTAL KNEE ARTHROPLASTY Right 03/21/2016   Procedure: RIGHT TOTAL KNEE ARTHROPLASTY;  Surgeon: Paralee Cancel, MD;  Location: [facility];  Service: Orthopedics;  Laterality: Right;    There were no vitals filed for this visit.      Subjective Assessment - 04/15/16 0947    Subjective Reports that his knee feels stiff and did stretching exercises yesterday but thinks he made it more stiff instead of loosening up.   Pertinent History CKD, DM, hx basal cell cancer, HLD, HTN, DVT/PE   Limitations Standing;Walking   How long can you stand comfortably? 2-3 min   How long can you walk comfortably? 3-4 min   Patient Stated Goals improve motion and mobility, decrease pain   Currently in Pain? No/denies            [facility] PT Assessment - 04/15/16 0001      Assessment   Medical Diagnosis R TKA   Onset Date/Surgical Date 03/21/16   Next MD Visit 04/2016   Prior Therapy acute x 2 visits     Precautions   Precautions Fall     Restrictions   Weight Bearing Restrictions No  [place] Adult PT Treatment/Exercise - 04/15/16 0001      Knee/Hip Exercises: Aerobic   Nustep L5, seat 11-10x x20 min     Knee/Hip Exercises: Supine   Short Arc Quad Sets Strengthening;Right;1 set;15 reps     Modalities   Modalities Electrical Stimulation;Cryotherapy     Cryotherapy   Number Minutes Cryotherapy 15 Minutes   Cryotherapy Location Knee   Type of Cryotherapy Ice pack     Electrical Stimulation   Electrical Stimulation Location R knee   Electrical Stimulation Action IFC   Electrical Stimulation Parameters 1-10 hz x15 min   Electrical Stimulation Goals Pain;Edema     Manual Therapy   Manual Therapy Passive ROM   Passive ROM PROM of R knee into flex/ext with gentle holds at end range                  PT Short Term Goals - 04/11/16 1246      PT SHORT TERM GOAL #1   Title independent with HEP  (04/14/16)   Time 3   Period Weeks   Status Achieved           PT Long Term Goals - 04/11/16 1304      PT LONG TERM GOAL #1   Title improve R knee AROM 0-95 degrees for improved function (05/05/16)   Time 6   Period Weeks   Status On-going  AROM -8 to 90 degrees 04/11/16     PT LONG TERM GOAL #2   Title ambulate > 200' without device independently for improved function (05/05/16)   Time 6   Period Weeks   Status On-going  patient using walker at this time 04/11/16     PT LONG TERM GOAL #3   Title report pain < 2/10 with ADLs for improved function (05/05/16)   Time 6   Period Weeks   Status On-going  patient not performing his normal ADL's at this time 04/11/16     PT LONG TERM GOAL #4   Title tolerate standing and walking > 10 min without increase in pain for improved function (05/05/16)   Time 6   Period Weeks   Status On-going  patient has not been able to stand this amount of time thus far 04/11/16               Plan - 04/15/16 1029    Clinical Impression Statement Patient progressing slowly followng R TKR as he continues to have edema in the R knee. Patient able to tolerate increased resistance with NuStep for 20 minutes with seat advancement to increase ROM. Patient able to tolerate longer holds with SAQ to promote Quad strengthening. Firm end feels noted with PROM of R knee into both flexion and extension and with smooth arc of motion into flexion. Normal modalities response noted following removal of the modalities. Patient continues to ambulate with FWW at this time.   Rehab Potential Good   PT Frequency 3x / week   PT Duration 6 weeks   PT Treatment/Interventions ADLs/Self Care Home Management;Cryotherapy;Electrical Stimulation;Functional mobility training;Stair training;Gait training;DME Instruction;Moist Heat;Therapeutic activities;Therapeutic exercise;Balance training;Neuromuscular re-education;Patient/family education;Passive range of motion;Scar  mobilization;Manual techniques   PT Next Visit Plan cont with POC per TKR / NO VASOPNEUMATIC   Consulted and Agree with Plan of Care Patient      Patient will benefit from skilled therapeutic intervention in order to improve the following deficits and impairments:  Abnormal gait, Cardiopulmonary status limiting activity, Decreased range of motion, Difficulty walking,  Pain, Decreased endurance, Decreased activity tolerance, Decreased skin integrity, Impaired flexibility, Decreased scar mobility, Decreased mobility, Decreased strength, Increased edema  Visit Diagnosis: Pain in right knee  Stiffness of right knee, not elsewhere classified  Other abnormalities of gait and mobility     Problem List Patient Active Problem List   Diagnosis Date Noted  . Anemia of chronic disease   . LOC (loss of consciousness) 04/08/2016  . TIA (transient ischemic attack) 04/08/2016  . 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 S/P knee replacement 03/21/2016  . Anemia associated with chronic renal failure 10/28/2015  . Hyperparathyroidism , secondary, non-renal ([place]) 10/28/2015  . Pulmonary emboli ([place]) 08/27/2015  . Near syncope 08/25/2015  . DOE (dyspnea on exertion) 06/02/2015  . Controlled type 2 diabetes mellitus ([place]) 06/02/2015  . Melanoma, history of 10/09/2013  . 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 Chronic kidney disease, stage III (moderate) 10/09/2013  . Venous (peripheral) insufficiency 10/09/2013  . Statin intolerance 10/09/2013  . Prostate cancer ([place]) 12/25/2012    Wynelle Fanny, PTA 04/15/2016, 10:47 AM  [facility] [address] [place], [place], [address] Phone: [phone]   Fax:  [phone]  Name: Khaleed Sipe. MRN: [id number] Date of Birth: 10-27-1940

## 2016-04-19 ENCOUNTER — Ambulatory Visit: Payer: Medicare Other | Admitting: Physical Therapy

## 2016-04-19 ENCOUNTER — Encounter: Payer: Self-pay | Admitting: Physical Therapy

## 2016-04-19 DIAGNOSIS — M25661 Stiffness of right knee, not elsewhere classified: Secondary | ICD-10-CM | POA: Diagnosis not present

## 2016-04-19 DIAGNOSIS — R2689 Other abnormalities of gait and mobility: Secondary | ICD-10-CM | POA: Diagnosis not present

## 2016-04-19 DIAGNOSIS — M25561 Pain in right knee: Secondary | ICD-10-CM

## 2016-04-19 NOTE — Therapy (Signed)
[facility] [place], [place], [address] Phone: [phone]   Fax:  [phone]  Physical Therapy Treatment  Patient Details  Name: Ricardo Mayer. MRN: [id number] Date of Birth: March 21, 1941 Referring Provider: Paralee Cancel, MD  Encounter Date: 04/19/2016      PT End of Session - 04/19/16 0937    Visit Number 10   Number of Visits 18   Date for PT Re-Evaluation 05/05/16   PT Start Time 0945   PT Stop Time 1037   PT Time Calculation (min) 52 min   Activity Tolerance Patient tolerated treatment well   Behavior During Therapy [facility] for tasks assessed/performed      Past Medical History:  Diagnosis Date  . Adenomatous polyp of colon   . Arthritis    bilateral knees  . CKD (chronic kidney disease), stage II    Dr. Patel,nephrology "stable last 3 yrs"  . Diabetes mellitus without complication ([place])    Dr. Laurance Flatten.PCP  . History of basal cell cancer   . History of DVT of lower extremity    2011--  LEFT LEG  . History of kidney stones   . History of melanoma excision    RIGHT ARM  . Hyperlipidemia   . Hypertension   . Impaired hearing    hearing aids bilateral  . Melanoma ([place]) 1990   arm and back -Melanoma, basal cell others- Dr. Edgardo Roys follows.  . Near syncope 08/25/2015   Dr. Roland Rack once"found blood clots in lung"  . Nocturia   . Prostate cancer [facility]) 2014   Dr. Cherylann Parr- "seed implant" '14  . Ulcer of left lower leg ([place])    recent pcp visit dr Laurance Flatten--  ordered betadine wet/dry dsg daily (03-18-2013)  [place] IN 1 WEEK    Past Surgical History:  Procedure Laterality Date  . KIDNEY STONE EXTRACTIONS  LAST ONE 1970   X2 OPEN/  X1  URETEROSCOPY  . MELANOMA EXCISION RIGHT ARM  1990  . PROSTATE BIOPSY    . RADIOACTIVE SEED IMPLANT N/A 03/21/2013   Procedure: RADIOACTIVE SEED IMPLANT;  Surgeon: Malka So, MD;  Location[facility];  Service: Urology;   Laterality: N/A;  87 seeds implanted    . TONSILLECTOMY    . TOTAL KNEE ARTHROPLASTY Right 03/21/2016   Procedure: RIGHT TOTAL KNEE ARTHROPLASTY;  Surgeon: Paralee Cancel, MD;  [facility];  Service: Orthopedics;  Laterality: Right;    There were no vitals filed for this visit.      Subjective Assessment - 04/19/16 0937    Subjective Denies any pain or any complaints today.   Pertinent History CKD, DM, hx basal cell cancer, HLD, HTN, DVT/PE   Limitations Standing;Walking   How long can you stand comfortably? 2-3 min   How long can you walk comfortably? 3-4 min   Patient Stated Goals improve motion and mobility, decrease pain   Currently in Pain? No/denies            [facility] PT Assessment - 04/19/16 0001      Assessment   Medical Diagnosis R TKA   Onset Date/Surgical Date 03/21/16   Next MD Visit 04/2016   Prior Therapy acute x 2 visits     Precautions   Precautions Fall     Restrictions   Weight Bearing Restrictions No     ROM / Strength   AROM / PROM / Strength AROM     AROM   Overall  AROM  Deficits   AROM Assessment Site Knee   Right/Left Knee Right   Right Knee Extension -7   Right Knee Flexion 100                     [facility] Adult PT Treatment/Exercise - 04/19/16 0001      Knee/Hip Exercises: Aerobic   Nustep L5, seat 8 and 10 x15 min for ROM     Knee/Hip Exercises: Seated   Long Arc Quad AROM;Right;2 sets;10 reps     Knee/Hip Exercises: Supine   Straight Leg Raises Strengthening;Right;2 sets;10 reps     Modalities   Modalities Electrical Stimulation;Cryotherapy     Cryotherapy   Number Minutes Cryotherapy 15 Minutes   Cryotherapy Location Knee   Type of Cryotherapy Ice pack     Electrical Stimulation   Electrical Stimulation Location R knee   Electrical Stimulation Action IFC   Electrical Stimulation Parameters 1-10 hz x15 min   Electrical Stimulation Goals Pain;Edema     Manual Therapy   Manual Therapy Passive ROM;Soft tissue  mobilization   Soft tissue mobilization R patellar mobilizations in lat/med, sup/inf directions to promote proper mobility   Passive ROM PROM of R knee into flex/ext with gentle holds at end range                  PT Short Term Goals - 04/11/16 1246      PT SHORT TERM GOAL #1   Title independent with HEP (04/14/16)   Time 3   Period Weeks   Status Achieved           PT Long Term Goals - 04/19/16 1036      PT LONG TERM GOAL #1   Title improve R knee AROM 0-95 degrees for improved function (05/05/16)   Time 6   Period Weeks   Status On-going  AROM -8 to 90 degrees 04/11/16     PT LONG TERM GOAL #2   Title ambulate > 200' without device independently for improved function (05/05/16)   Time 6   Period Weeks   Status On-going  Patient now using [place] for ambulation 04/19/2016     PT LONG TERM GOAL #3   Title report pain < 2/10 with ADLs for improved function (05/05/16)   Time 6   Period Weeks   Status On-going  patient not performing his normal ADL's at this time 04/11/16     PT LONG TERM GOAL #4   Title tolerate standing and walking > 10 min without increase in pain for improved function (05/05/16)   Time 6   Period Weeks   Status On-going  patient has not been able to stand this amount of time thus far 04/11/16               Plan - 04/19/16 1026    Clinical Impression Statement Patient arrived to treatment with no reports of R knee pain and continues to use FWW with ambulation. Patient able to tolerate gentle R Quad strengthening without resistance today. Increased area of edema noted lateral to R knee incision upon observation today. Good patellar mobility noted although edema present around R patella. AROM of R knee measured as 7-100 deg in supine today following PROM. Normal modalities response noted following removal of the modalities.   Rehab Potential Good   PT Frequency 3x / week   PT Duration 6 weeks   PT Treatment/Interventions ADLs/Self Care Home  Management;Cryotherapy;Electrical Stimulation;Functional mobility training;Stair training;Gait  training;DME Instruction;Moist Heat;Therapeutic activities;Therapeutic exercise;Balance training;Neuromuscular re-education;Patient/family education;Passive range of motion;Scar mobilization;Manual techniques   PT Next Visit Plan cont with POC per TKR / NO VASOPNEUMATIC   Consulted and Agree with Plan of Care Patient      Patient will benefit from skilled therapeutic intervention in order to improve the following deficits and impairments:  Abnormal gait, Cardiopulmonary status limiting activity, Decreased range of motion, Difficulty walking, Pain, Decreased endurance, Decreased activity tolerance, Decreased skin integrity, Impaired flexibility, Decreased scar mobility, Decreased mobility, Decreased strength, Increased edema  Visit Diagnosis: Pain in right knee  Stiffness of right knee, not elsewhere classified  Other abnormalities of gait and mobility     Problem List Patient Active Problem List   Diagnosis Date Noted  . Anemia of chronic disease   . LOC (loss of consciousness) 04/08/2016  . TIA (transient ischemic attack) 04/08/2016  . 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 S/P knee replacement 03/21/2016  . Anemia associated with chronic renal failure 10/28/2015  . Hyperparathyroidism , secondary, non-renal ([place]) 10/28/2015  . Pulmonary emboli ([place]) 08/27/2015  . Near syncope 08/25/2015  . DOE (dyspnea on exertion) 06/02/2015  . Controlled type 2 diabetes mellitus ([place]) 06/02/2015  . Melanoma, history of 10/09/2013  . 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 Chronic kidney disease, stage III (moderate) 10/09/2013  . Venous (peripheral) insufficiency 10/09/2013  . Statin intolerance 10/09/2013  . Prostate cancer ([place]) 12/25/2012    Ahmed Prima, PTA 04/19/16 10:40 AM  [facility] [address] [place], [place], [address] Phone: [phone]   Fax:  [phone]  Name: Ural Shor. MRN: [id number] Date of Birth: Aug 15, 1941

## 2016-04-21 ENCOUNTER — Ambulatory Visit: Payer: Medicare Other | Admitting: Physical Therapy

## 2016-04-21 ENCOUNTER — Encounter: Payer: Self-pay | Admitting: Physical Therapy

## 2016-04-21 DIAGNOSIS — R2689 Other abnormalities of gait and mobility: Secondary | ICD-10-CM

## 2016-04-21 DIAGNOSIS — M25561 Pain in right knee: Secondary | ICD-10-CM

## 2016-04-21 DIAGNOSIS — M25661 Stiffness of right knee, not elsewhere classified: Secondary | ICD-10-CM | POA: Diagnosis not present

## 2016-04-21 NOTE — Therapy (Signed)
[facility] [place], [place], [address] Phone: [phone]   Fax:  [phone]  Physical Therapy Treatment  Patient Details  Name: Ricardo Mayer. MRN: [id number] Date of Birth: [birth year] Referring Provider: Paralee Cancel, MD  Encounter Date: 04/21/2016      PT End of Session - 04/21/16 1028    Visit Number 11   Number of Visits 18   Date for PT Re-Evaluation 05/05/16   PT Start Time 0946   PT Stop Time 1043   PT Time Calculation (min) 57 min   Activity Tolerance Patient tolerated treatment well   Behavior During Therapy [facility] for tasks assessed/performed      Past Medical History:  Diagnosis Date  . Adenomatous polyp of colon   . Arthritis    bilateral knees  . CKD (chronic kidney disease), stage II    Dr. Patel,nephrology "stable last 3 yrs"  . Diabetes mellitus without complication ([place])    Dr. Laurance Flatten.PCP  . History of basal cell cancer   . History of DVT of lower extremity    2011--  LEFT LEG  . History of kidney stones   . History of melanoma excision    RIGHT ARM  . Hyperlipidemia   . Hypertension   . Impaired hearing    hearing aids bilateral  . Melanoma ([place]) 1990   arm and back -Melanoma, basal cell others- Dr. Edgardo Roys follows.  . Near syncope 08/25/2015   Dr. Roland Rack once"found blood clots in lung"  . Nocturia   . Prostate cancer [facility]) 2014   Dr. Cherylann Parr- "seed implant" '14  . Ulcer of left lower leg ([place])    recent pcp visit dr Laurance Flatten--  ordered betadine wet/dry dsg daily (03-18-2013)  [place] IN 1 WEEK    Past Surgical History:  Procedure Laterality Date  . KIDNEY STONE EXTRACTIONS  LAST ONE 1970   X2 OPEN/  X1  URETEROSCOPY  . MELANOMA EXCISION RIGHT ARM  1990  . PROSTATE BIOPSY    . RADIOACTIVE SEED IMPLANT N/A 03/21/2013   Procedure: RADIOACTIVE SEED IMPLANT;  Surgeon: Malka So, MD;  Location: [facility];  Service: Urology;   Laterality: N/A;  87 seeds implanted    . TONSILLECTOMY    . TOTAL KNEE ARTHROPLASTY Right 03/21/2016   Procedure: RIGHT TOTAL KNEE ARTHROPLASTY;  Surgeon: Paralee Cancel, MD;  [facility];  Service: Orthopedics;  Laterality: Right;    There were no vitals filed for this visit.      Subjective Assessment - 04/21/16 1002    Subjective Patient reports only pain with movement at this time up to 2/10   Patient is accompained by: Family member   Pertinent History CKD, DM, hx basal cell cancer, HLD, HTN, DVT/PE   Limitations Standing;Walking   How long can you stand comfortably? 2-3 min   How long can you walk comfortably? 3-4 min   Currently in Pain? No/denies            [facility] PT Assessment - 04/21/16 0001      ROM / Strength   AROM / PROM / Strength AROM;PROM     AROM   AROM Assessment Site Knee   Right/Left Knee Right   Right Knee Extension -13   Right Knee Flexion 92     PROM   PROM Assessment Site Knee   Right/Left Knee Right   Right Knee Extension -8   Right Knee Flexion 104  [place] Adult PT Treatment/Exercise - 04/21/16 0001      Knee/Hip Exercises: Aerobic   Nustep L5 x15 min for ROM     Knee/Hip Exercises: Seated   Long Arc Quad Right;Strengthening;3 sets;10 reps;Weights   Long Arc Quad Weight 3 lbs.     Cryotherapy   Number Minutes Cryotherapy 15 Minutes   Cryotherapy Location Knee   Type of Cryotherapy Ice pack     Electrical Stimulation   Electrical Stimulation Location --   Electrical Stimulation Action --   Electrical Stimulation Parameters --   Electrical Stimulation Goals --     Manual Therapy   Manual Therapy Passive ROM   Passive ROM PROM of R knee into flex/ext with gentle holds at end range                  PT Short Term Goals - 04/11/16 1246      PT SHORT TERM GOAL #1   Title independent with HEP (04/14/16)   Time 3   Period Weeks   Status Achieved           PT Long Term Goals -  04/19/16 1036      PT LONG TERM GOAL #1   Title improve R knee AROM 0-95 degrees for improved function (05/05/16)   Time 6   Period Weeks   Status On-going  AROM -8 to 90 degrees 04/11/16     PT LONG TERM GOAL #2   Title ambulate > 200' without device independently for improved function (05/05/16)   Time 6   Period Weeks   Status On-going  Patient now using [place] for ambulation 04/19/2016     PT LONG TERM GOAL #3   Title report pain < 2/10 with ADLs for improved function (05/05/16)   Time 6   Period Weeks   Status On-going  patient not performing his normal ADL's at this time 04/11/16     PT LONG TERM GOAL #4   Title tolerate standing and walking > 10 min without increase in pain for improved function (05/05/16)   Time 6   Period Weeks   Status On-going  patient has not been able to stand this amount of time thus far 04/11/16               Plan - 04/21/16 1030    Clinical Impression Statement Patient reported knee feeling better overall with little soreness unless a lot of ROM or activity will increase to 1-2/10. Patient has improved PROM today. Patient knee was stiff upon arival yet was able to increase after gentle ROM. No electrical stimulation today due to patient sensation defict and unable to feel it.    Rehab Potential Good   PT Frequency 3x / week   PT Duration 6 weeks   PT Treatment/Interventions ADLs/Self Care Home Management;Cryotherapy;Functional mobility training;Stair training;Gait training;DME Instruction;Moist Heat;Therapeutic activities;Therapeutic exercise;Balance training;Neuromuscular re-education;Patient/family education;Passive range of motion;Scar mobilization;Manual techniques   PT Next Visit Plan cont with POC per TKR / NO VASOPNEUMATIC or ELECTRICAL STIMULATON per MPT   Consulted and Agree with Plan of Care Patient      Patient will benefit from skilled therapeutic intervention in order to improve the following deficits and impairments:  Abnormal gait,  Cardiopulmonary status limiting activity, Decreased range of motion, Difficulty walking, Pain, Decreased endurance, Decreased activity tolerance, Decreased skin integrity, Impaired flexibility, Decreased scar mobility, Decreased mobility, Decreased strength, Increased edema  Visit Diagnosis: Pain in right knee  Stiffness of right knee, not elsewhere  classified  Other abnormalities of gait and mobility     Problem List Patient Active Problem List   Diagnosis Date Noted  . Anemia of chronic disease   . LOC (loss of consciousness) 04/08/2016  . TIA (transient ischemic attack) 04/08/2016  . 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 S/P knee replacement 03/21/2016  . Anemia associated with chronic renal failure 10/28/2015  . Hyperparathyroidism , secondary, non-renal ([place]) 10/28/2015  . Pulmonary emboli ([place]) 08/27/2015  . Near syncope 08/25/2015  . DOE (dyspnea on exertion) 06/02/2015  . Controlled type 2 diabetes mellitus ([place]) 06/02/2015  . Melanoma, history of 10/09/2013  . 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 Chronic kidney disease, stage III (moderate) 10/09/2013  . Venous (peripheral) insufficiency 10/09/2013  . Statin intolerance 10/09/2013  . Prostate cancer ([place]) 12/25/2012    Amen Dargis P 04/21/2016, 10:47 AM  Ladean Raya, PTA 04/21/16 10:47 AM   [facility] [place], [place], [address] Phone: [phone]   Fax:  [phone]  Name: Serge Main. MRN: [id number] Date of Birth: 23-Jul-1941

## 2016-04-25 ENCOUNTER — Encounter: Payer: Self-pay | Admitting: Physical Therapy

## 2016-04-25 ENCOUNTER — Ambulatory Visit: Payer: Medicare Other | Admitting: Physical Therapy

## 2016-04-25 DIAGNOSIS — M25561 Pain in right knee: Secondary | ICD-10-CM

## 2016-04-25 DIAGNOSIS — M25661 Stiffness of right knee, not elsewhere classified: Secondary | ICD-10-CM

## 2016-04-25 DIAGNOSIS — R2689 Other abnormalities of gait and mobility: Secondary | ICD-10-CM | POA: Diagnosis not present

## 2016-04-25 NOTE — Therapy (Signed)
[facility] [place], [place], [address] Phone: [phone]   Fax:  [phone]  Physical Therapy Treatment  Patient Details  Name: Ricardo Mayer. MRN: [id number] Date of Birth: 1940-09-05 Referring Provider: Paralee Cancel, MD  Encounter Date: 04/25/2016      PT End of Session - 04/25/16 0941    Visit Number 12   Number of Visits 18   Date for PT Re-Evaluation 05/05/16   PT Start Time 0947   PT Stop Time 1045   PT Time Calculation (min) 58 min   Activity Tolerance Patient tolerated treatment well   Behavior During Therapy [facility] for tasks assessed/performed      Past Medical History:  Diagnosis Date  . Adenomatous polyp of colon   . Arthritis    bilateral knees  . CKD (chronic kidney disease), stage II    Dr. Patel,nephrology "stable last 3 yrs"  . Diabetes mellitus without complication ([place])    Dr. Laurance Flatten.PCP  . History of basal cell cancer   . History of DVT of lower extremity    2011--  LEFT LEG  . History of kidney stones   . History of melanoma excision    RIGHT ARM  . Hyperlipidemia   . Hypertension   . Impaired hearing    hearing aids bilateral  . Melanoma ([place]) 1990   arm and back -Melanoma, basal cell others- Dr. Edgardo Roys follows.  . Near syncope 08/25/2015   Dr. Roland Rack once"found blood clots in lung"  . Nocturia   . Prostate cancer [facility]) 2014   Dr. Cherylann Parr- "seed implant" '14  . Ulcer of left lower leg ([place])    recent pcp visit dr Laurance Flatten--  ordered betadine wet/dry dsg daily (03-18-2013)  [place] IN 1 WEEK    Past Surgical History:  Procedure Laterality Date  . KIDNEY STONE EXTRACTIONS  LAST ONE 1970   X2 OPEN/  X1  URETEROSCOPY  . MELANOMA EXCISION RIGHT ARM  1990  . PROSTATE BIOPSY    . RADIOACTIVE SEED IMPLANT N/A 03/21/2013   Procedure: RADIOACTIVE SEED IMPLANT;  Surgeon: Malka So, MD;  Location: [facility];  Service: Urology;   Laterality: N/A;  87 seeds implanted    . TONSILLECTOMY    . TOTAL KNEE ARTHROPLASTY Right 03/21/2016   Procedure: RIGHT TOTAL KNEE ARTHROPLASTY;  Surgeon: Paralee Cancel, MD;  Location: [facility];  Service: Orthopedics;  Laterality: Right;    There were no vitals filed for this visit.      Subjective Assessment - 04/25/16 0941    Subjective Reports he was working a 2 hours for 4 days last week with no problem. Reports that his knee "is talking to me a little bit this morning." Reports that discomfort is just with movement.   Pertinent History CKD, DM, hx basal cell cancer, HLD, HTN, DVT/PE   Limitations Standing;Walking   How long can you stand comfortably? 2-3 min   How long can you walk comfortably? 3-4 min   Patient Stated Goals improve motion and mobility, decrease pain   Currently in Pain? Yes   Pain Score 3    Pain Location Knee   Pain Orientation Right   Pain Descriptors / Indicators Aching   Pain Type Surgical pain   Pain Onset 1 to 4 weeks ago   Pain Frequency Intermittent  with knee flexion            [facility] PT Assessment - 04/25/16 0001  Assessment   Medical Diagnosis R TKA   Onset Date/Surgical Date 03/21/16   Next MD Visit 04/2016   Prior Therapy acute x 2 visits     Precautions   Precautions Fall     Restrictions   Weight Bearing Restrictions No                     [facility] Adult PT Treatment/Exercise - 04/25/16 0001      Knee/Hip Exercises: Aerobic   Nustep L5 x15 min for ROM     Knee/Hip Exercises: Standing   Hip Flexion AROM;Both;2 sets;10 reps;Knee bent  BLE for ROM and SLS   Hip Abduction AROM;Right;2 sets;10 reps;Knee straight   Forward Step Up Right;2 sets;10 reps;Hand Hold: 2;Step Height: 6"   Rocker Board 3 minutes     Knee/Hip Exercises: Seated   Long Arc Quad Right;Strengthening;3 sets;10 reps;Weights   Long Arc Quad Weight 4 lbs.     Knee/Hip Exercises: Supine   Straight Leg Raises Strengthening;Right;2 sets;10 reps    Other Supine Knee/Hip Exercises B hip clamshell green theraband x20 reps     Modalities   Modalities Cryotherapy     Cryotherapy   Number Minutes Cryotherapy 15 Minutes   Cryotherapy Location Knee   Type of Cryotherapy Ice pack     Manual Therapy   Manual Therapy Soft tissue mobilization;Passive ROM   Soft tissue mobilization Incision and patella mobilizations to promote proper mobilty   Passive ROM PROM of R knee into flex/ext with gentle holds at end range                  PT Short Term Goals - 04/11/16 1246      PT SHORT TERM GOAL #1   Title independent with HEP (04/14/16)   Time 3   Period Weeks   Status Achieved           PT Long Term Goals - 04/19/16 1036      PT LONG TERM GOAL #1   Title improve R knee AROM 0-95 degrees for improved function (05/05/16)   Time 6   Period Weeks   Status On-going  AROM -8 to 90 degrees 04/11/16     PT LONG TERM GOAL #2   Title ambulate > 200' without device independently for improved function (05/05/16)   Time 6   Period Weeks   Status On-going  Patient now using SPC for ambulation 04/19/2016     PT LONG TERM GOAL #3   Title report pain < 2/10 with ADLs for improved function (05/05/16)   Time 6   Period Weeks   Status On-going  patient not performing his normal ADL's at this time 04/11/16     PT LONG TERM GOAL #4   Title tolerate standing and walking > 10 min without increase in pain for improved function (05/05/16)   Time 6   Period Weeks   Status On-going  patient has not been able to stand this amount of time thus far 04/11/16               Plan - 04/25/16 1107    Clinical Impression Statement Patient arrived to treatment with low level pain rating in R knee. Continues to ambulate with FWW without issue. Patient able to tolerate all therapeutic exercises without complaint and without difficulty. R patellar mobilizations limited secondary to edema that is present around R patella and R incision mobilization  slightly limited today as well. Firm end feels noted  with PROM of R knee into flexion and extension with no complaint from patient. Normal cryotherapy response noted following removal of the modality to control edema.   Rehab Potential Good   PT Frequency 3x / week   PT Duration 6 weeks   PT Treatment/Interventions ADLs/Self Care Home Management;Cryotherapy;Functional mobility training;Stair training;Gait training;DME Instruction;Moist Heat;Therapeutic activities;Therapeutic exercise;Balance training;Neuromuscular re-education;Patient/family education;Passive range of motion;Scar mobilization;Manual techniques   PT Next Visit Plan cont with POC per TKR / NO VASOPNEUMATIC or ELECTRICAL STIMULATON per MPT   Consulted and Agree with Plan of Care Patient      Patient will benefit from skilled therapeutic intervention in order to improve the following deficits and impairments:  Abnormal gait, Cardiopulmonary status limiting activity, Decreased range of motion, Difficulty walking, Pain, Decreased endurance, Decreased activity tolerance, Decreased skin integrity, Impaired flexibility, Decreased scar mobility, Decreased mobility, Decreased strength, Increased edema  Visit Diagnosis: Pain in right knee  Stiffness of right knee, not elsewhere classified  Other abnormalities of gait and mobility     Problem List Patient Active Problem List   Diagnosis Date Noted  . Anemia of chronic disease   . LOC (loss of consciousness) 04/08/2016  . TIA (transient ischemic attack) 04/08/2016  . 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 S/P knee replacement 03/21/2016  . Anemia associated with chronic renal failure 10/28/2015  . Hyperparathyroidism , secondary, non-renal ([place]) 10/28/2015  . Pulmonary emboli ([place]) 08/27/2015  . Near syncope 08/25/2015  . DOE (dyspnea on exertion) 06/02/2015  . Controlled type 2 diabetes mellitus ([place]) 06/02/2015  . Melanoma, history of 10/09/2013  . 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 Chronic kidney disease, stage III (moderate) 10/09/2013  . Venous (peripheral) insufficiency 10/09/2013  . Statin intolerance 10/09/2013  . Prostate cancer ([place]) 12/25/2012    Wynelle Fanny, PTA 04/25/2016, 11:18 AM  [facility] [address] [place], [place], [address] Phone: [phone]   Fax:  [phone]  Name: Ricardo Mayer. MRN: [id number] Date of Birth: [birth year]

## 2016-04-27 ENCOUNTER — Encounter: Payer: Self-pay | Admitting: Physical Therapy

## 2016-04-27 ENCOUNTER — Ambulatory Visit: Payer: Medicare Other | Admitting: Physical Therapy

## 2016-04-27 DIAGNOSIS — M25561 Pain in right knee: Secondary | ICD-10-CM

## 2016-04-27 DIAGNOSIS — M25661 Stiffness of right knee, not elsewhere classified: Secondary | ICD-10-CM | POA: Diagnosis not present

## 2016-04-27 DIAGNOSIS — R2689 Other abnormalities of gait and mobility: Secondary | ICD-10-CM | POA: Diagnosis not present

## 2016-04-27 NOTE — Therapy (Signed)
[facility] [place], [place], [address] Phone: [phone]   Fax:  [phone]  Physical Therapy Treatment  Patient Details  Name: Ricardo Mayer. MRN: [id number] Date of Birth: 01/15/1941 Referring Provider: Paralee Cancel, MD  Encounter Date: 04/27/2016      PT End of Session - 04/27/16 0945    Visit Number 13   Number of Visits 18   Date for PT Re-Evaluation 05/05/16   PT Start Time 0950   PT Stop Time 1042   PT Time Calculation (min) 52 min   Activity Tolerance Patient tolerated treatment well   Behavior During Therapy [facility] for tasks assessed/performed      Past Medical History:  Diagnosis Date  . Adenomatous polyp of colon   . Arthritis    bilateral knees  . CKD (chronic kidney disease), stage II    Dr. Patel,nephrology "stable last 3 yrs"  . Diabetes mellitus without complication ([place])    Dr. Laurance Flatten.PCP  . History of basal cell cancer   . History of DVT of lower extremity    2011--  LEFT LEG  . History of kidney stones   . History of melanoma excision    RIGHT ARM  . Hyperlipidemia   . Hypertension   . Impaired hearing    hearing aids bilateral  . Melanoma ([place]) 1990   arm and back -Melanoma, basal cell others- Dr. Edgardo Roys follows.  . Near syncope 08/25/2015   Dr. Roland Rack once"found blood clots in lung"  . Nocturia   . Prostate cancer [facility]) 2014   Dr. Cherylann Parr- "seed implant" '14  . Ulcer of left lower leg ([place])    recent pcp visit dr Laurance Flatten--  ordered betadine wet/dry dsg daily (03-18-2013)  [place] IN 1 WEEK    Past Surgical History:  Procedure Laterality Date  . KIDNEY STONE EXTRACTIONS  LAST ONE 1970   X2 OPEN/  X1  URETEROSCOPY  . MELANOMA EXCISION RIGHT ARM  1990  . PROSTATE BIOPSY    . RADIOACTIVE SEED IMPLANT N/A 03/21/2013   Procedure: RADIOACTIVE SEED IMPLANT;  Surgeon: Malka So, MD;  Location: [facility];  Service: Urology;   Laterality: N/A;  87 seeds implanted    . TONSILLECTOMY    . TOTAL KNEE ARTHROPLASTY Right 03/21/2016   Procedure: RIGHT TOTAL KNEE ARTHROPLASTY;  Surgeon: Paralee Cancel, MD;  Location[facility];  Service: Orthopedics;  Laterality: Right;    There were no vitals filed for this visit.      Subjective Assessment - 04/27/16 0945    Subjective Reports overall doing well and only pain with stretching or flexion.   Pertinent History CKD, DM, hx basal cell cancer, HLD, HTN, DVT/PE   Limitations Standing;Walking   How long can you stand comfortably? 2-3 min   How long can you walk comfortably? 3-4 min   Currently in Pain? No/denies            [facility] PT Assessment - 04/27/16 0001      Assessment   Medical Diagnosis R TKA   Onset Date/Surgical Date 03/21/16   Next MD Visit 04/2016   Prior Therapy acute x 2 visits     Precautions   Precautions Fall     Restrictions   Weight Bearing Restrictions No     ROM / Strength   AROM / PROM / Strength AROM     AROM   Overall AROM  Deficits   AROM Assessment  Site Knee   Right/Left Knee Right   Right Knee Extension -3   Right Knee Flexion 100                     [facility] Adult PT Treatment/Exercise - 04/27/16 0001      Ambulation/Gait   Ambulation/Gait Yes   Ambulation/Gait Assistance 6: Modified independent (Device/Increase time)   Ambulation Distance (Feet) 85 Feet   Assistive device Straight cane   Gait Pattern Step-to pattern;Decreased stride length;Decreased stance time - right;Decreased hip/knee flexion - right;Antalgic;Decreased trunk rotation;Narrow base of support   Ambulation Surface Level;Indoor     Knee/Hip Exercises: Stretches   Active Hamstring Stretch Right;3 reps;30 seconds     Knee/Hip Exercises: Aerobic   Nustep L7 x12 min     Knee/Hip Exercises: Standing   Forward Lunges Right;2 sets;10 reps;2 seconds   Hip Abduction AROM;Right;2 sets;10 reps;Knee straight   Forward Step Up Right;2 sets;10 reps;Hand  Hold: 2;Step Height: 6"   Rocker Board 3 minutes     Knee/Hip Exercises: Seated   Long Arc Quad Right;Strengthening;3 sets;10 reps;Weights   Long Arc Quad Weight 4 lbs.     Modalities   Modalities Cryotherapy     Cryotherapy   Number Minutes Cryotherapy 10 Minutes   Cryotherapy Location Knee   Type of Cryotherapy Ice pack     Manual Therapy   Manual Therapy Soft tissue mobilization;Passive ROM   Soft tissue mobilization Incision and patella mobilizations to promote proper mobilty   Passive ROM PROM of R knee into flex/ext with gentle holds at end range                  PT Short Term Goals - 04/11/16 1246      PT SHORT TERM GOAL #1   Title independent with HEP (04/14/16)   Time 3   Period Weeks   Status Achieved           PT Long Term Goals - 04/27/16 1003      PT LONG TERM GOAL #1   Title improve R knee AROM 0-95 degrees for improved function (05/05/16)   Time 6   Period Weeks   Status On-going  AROM R knee measured as 3-100 deg 04/27/2016     PT LONG TERM GOAL #2   Title ambulate > 200' without device independently for improved function (05/05/16)   Time 6   Period Weeks   Status On-going  Patient now using [place] for ambulation 04/19/2016     PT LONG TERM GOAL #3   Title report pain < 2/10 with ADLs for improved function (05/05/16)   Time 6   Period Weeks   Status Achieved  Achieved per patient report 04/27/2016     PT LONG TERM GOAL #4   Title tolerate standing and walking > 10 min without increase in pain for improved function (05/05/16)   Time 6   Period Weeks   Status Achieved  Achieved per patient report 04/27/2016               Plan - 04/27/16 1102    Clinical Impression Statement Patient arrived with no current R knee pain and continuing to use FWW for ambulation. Patient able to complete all therapeutic exercises per directions without complaint of pain by patient. Patient ambulated safely with FWW and trial run with SPC was conducted.  Patient able to ambulate safely with [facility] and with VCs and demonstration for 2 pt gait pattern technique. Patient  was encouraged to discontinue FWW use and advance to [facility] at this time. Better R patella mobilizations noted today during patellar mobilizations with edema still present around the R patella especially in laterodistal R knee. AROM R knee measured as 3-100 deg in supine. Patient presented with increased R HS and adductors today upon palpation. Normal cryotherapy response noted following removal of the ice pack.   Rehab Potential Good   PT Frequency 3x / week   PT Duration 6 weeks   PT Treatment/Interventions ADLs/Self Care Home Management;Cryotherapy;Functional mobility training;Stair training;Gait training;DME Instruction;Moist Heat;Therapeutic activities;Therapeutic exercise;Balance training;Neuromuscular re-education;Patient/family education;Passive range of motion;Scar mobilization;Manual techniques   PT Next Visit Plan cont with POC per TKR / NO VASOPNEUMATIC or ELECTRICAL STIMULATON per MPT   Consulted and Agree with Plan of Care Patient      Patient will benefit from skilled therapeutic intervention in order to improve the following deficits and impairments:  Abnormal gait, Cardiopulmonary status limiting activity, Decreased range of motion, Difficulty walking, Pain, Decreased endurance, Decreased activity tolerance, Decreased skin integrity, Impaired flexibility, Decreased scar mobility, Decreased mobility, Decreased strength, Increased edema  Visit Diagnosis: Pain in right knee  Stiffness of right knee, not elsewhere classified  Other abnormalities of gait and mobility     Problem List Patient Active Problem List   Diagnosis Date Noted  . Anemia of chronic disease   . LOC (loss of consciousness) 04/08/2016  . TIA (transient ischemic attack) 04/08/2016  . 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 S/P knee replacement 03/21/2016  . Anemia associated with chronic renal failure  10/28/2015  . Hyperparathyroidism , secondary, non-renal ([place]) 10/28/2015  . Pulmonary emboli ([place]) 08/27/2015  . Near syncope 08/25/2015  . DOE (dyspnea on exertion) 06/02/2015  . Controlled type 2 diabetes mellitus ([place]) 06/02/2015  . Melanoma, history of 10/09/2013  . 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 Chronic kidney disease, stage III (moderate) 10/09/2013  . Venous (peripheral) insufficiency 10/09/2013  . Statin intolerance 10/09/2013  . Prostate cancer ([place]) 12/25/2012    Wynelle Fanny, PTA 04/27/2016, 11:09 AM  [facility] [address] [place], [place], [address] Phone: [phone]   Fax:  [phone]  Name: Ricardo Mayer. MRN: [id number] Date of Birth: 03/30/1941

## 2016-04-29 ENCOUNTER — Ambulatory Visit: Payer: Medicare Other | Admitting: Physical Therapy

## 2016-04-29 ENCOUNTER — Encounter: Payer: Self-pay | Admitting: Physical Therapy

## 2016-04-29 DIAGNOSIS — M25661 Stiffness of right knee, not elsewhere classified: Secondary | ICD-10-CM

## 2016-04-29 DIAGNOSIS — M25561 Pain in right knee: Secondary | ICD-10-CM | POA: Diagnosis not present

## 2016-04-29 DIAGNOSIS — R2689 Other abnormalities of gait and mobility: Secondary | ICD-10-CM

## 2016-04-29 NOTE — Therapy (Signed)
[facility] [place], [place], [address] Phone: [phone]   Fax:  [phone]  Physical Therapy Treatment  Patient Details  Name: Ricardo Mayer. MRN: [id number] Date of Birth: Dec 14, 1940 Referring Provider: Paralee Cancel, MD  Encounter Date: 04/29/2016      PT End of Session - 04/29/16 0942    Visit Number 14   Number of Visits 18   Date for PT Re-Evaluation 05/05/16   PT Start Time 0947   PT Stop Time 1038   PT Time Calculation (min) 51 min   Activity Tolerance Patient tolerated treatment well   Behavior During Therapy [facility] for tasks assessed/performed      Past Medical History:  Diagnosis Date  . Adenomatous polyp of colon   . Arthritis    bilateral knees  . CKD (chronic kidney disease), stage II    Dr. Patel,nephrology "stable last 3 yrs"  . Diabetes mellitus without complication ([place])    Dr. Laurance Flatten.PCP  . History of basal cell cancer   . History of DVT of lower extremity    2011--  LEFT LEG  . History of kidney stones   . History of melanoma excision    RIGHT ARM  . Hyperlipidemia   . Hypertension   . Impaired hearing    hearing aids bilateral  . Melanoma ([place]) 1990   arm and back -Melanoma, basal cell others- Dr. Edgardo Roys follows.  . Near syncope 08/25/2015   Dr. Roland Rack once"found blood clots in lung"  . Nocturia   . Prostate cancer [facility]) 2014   Dr. Cherylann Parr- "seed implant" '14  . Ulcer of left lower leg ([place])    recent pcp visit dr Laurance Flatten--  ordered betadine wet/dry dsg daily (03-18-2013)  [place] IN 1 WEEK    Past Surgical History:  Procedure Laterality Date  . KIDNEY STONE EXTRACTIONS  LAST ONE 1970   X2 OPEN/  X1  URETEROSCOPY  . MELANOMA EXCISION RIGHT ARM  1990  . PROSTATE BIOPSY    . RADIOACTIVE SEED IMPLANT N/A 03/21/2013   Procedure: RADIOACTIVE SEED IMPLANT;  Surgeon: Malka So, MD;  Location: [facility];  Service: Urology;   Laterality: N/A;  87 seeds implanted    . TONSILLECTOMY    . TOTAL KNEE ARTHROPLASTY Right 03/21/2016   Procedure: RIGHT TOTAL KNEE ARTHROPLASTY;  Surgeon: Paralee Cancel, MD;  Location[facility];  Service: Orthopedics;  Laterality: Right;    There were no vitals filed for this visit.      Subjective Assessment - 04/29/16 0942    Subjective Reports only pain with bending R knee.   Pertinent History CKD, DM, hx basal cell cancer, HLD, HTN, DVT/PE   Limitations Standing;Walking   How long can you stand comfortably? 2-3 min   How long can you walk comfortably? 3-4 min   Patient Stated Goals improve motion and mobility, decrease pain   Currently in Pain? No/denies            [facility] PT Assessment - 04/29/16 0001      Assessment   Medical Diagnosis R TKA   Onset Date/Surgical Date 03/21/16   Next MD Visit 04/2016   Prior Therapy acute x 2 visits     Precautions   Precautions Fall     Restrictions   Weight Bearing Restrictions No                     [facility] Adult PT  Treatment/Exercise - 04/29/16 0001      Knee/Hip Exercises: Stretches   Active Hamstring Stretch Right;3 reps;30 seconds     Knee/Hip Exercises: Aerobic   Nustep L8 x15 min     Knee/Hip Exercises: Standing   Forward Lunges Right;2 sets;10 reps;2 seconds   Forward Step Up Right;2 sets;10 reps;Hand Hold: 2;Step Height: 6"   Rocker Board 3 minutes     Knee/Hip Exercises: Seated   Long Arc Quad Strengthening;Right;2 sets;10 reps;Weights   Long Arc Quad Weight 4 lbs.     Modalities   Modalities Cryotherapy     Cryotherapy   Number Minutes Cryotherapy 10 Minutes   Cryotherapy Location Knee   Type of Cryotherapy Ice pack     Manual Therapy   Manual Therapy Soft tissue mobilization;Passive ROM   Soft tissue mobilization Incision and patella mobilizations to promote proper mobilty   Passive ROM PROM of R knee into flex/ext with gentle holds at end range                  PT Short Term  Goals - 04/11/16 1246      PT SHORT TERM GOAL #1   Title independent with HEP (04/14/16)   Time 3   Period Weeks   Status Achieved           PT Long Term Goals - 04/27/16 1003      PT LONG TERM GOAL #1   Title improve R knee AROM 0-95 degrees for improved function (05/05/16)   Time 6   Period Weeks   Status On-going  AROM R knee measured as 3-100 deg 04/27/2016     PT LONG TERM GOAL #2   Title ambulate > 200' without device independently for improved function (05/05/16)   Time 6   Period Weeks   Status On-going  Patient now using [place] for ambulation 04/19/2016     PT LONG TERM GOAL #3   Title report pain < 2/10 with ADLs for improved function (05/05/16)   Time 6   Period Weeks   Status Achieved  Achieved per patient report 04/27/2016     PT LONG TERM GOAL #4   Title tolerate standing and walking > 10 min without increase in pain for improved function (05/05/16)   Time 6   Period Weeks   Status Achieved  Achieved per patient report 04/27/2016               Plan - 04/29/16 1028    Clinical Impression Statement Patient arrived to clinic with no current pain in R knee and now using [facility] for ambulation. Patient able to complete all therapeutic exercises well with stretches to increase ROM without complaint. Swelling still present around R knee especially around R patella at this time. Firm end feels noted with PROM of R knee into flexion and extension. Patella required additional search to identify it due to the swelling present. Patient continues to ice at home before bedtime x1 hour but educated that 15-20 minutes is sufficient. Good incision observation and mobility noted today. Normal cryotherapy response noted following removal of the modality.   Rehab Potential Good   PT Frequency 3x / week   PT Duration 6 weeks   PT Treatment/Interventions ADLs/Self Care Home Management;Cryotherapy;Functional mobility training;Stair training;Gait training;DME Instruction;Moist  Heat;Therapeutic activities;Therapeutic exercise;Balance training;Neuromuscular re-education;Patient/family education;Passive range of motion;Scar mobilization;Manual techniques   PT Next Visit Plan cont with POC per TKR / NO VASOPNEUMATIC or ELECTRICAL STIMULATON per MPT   Consulted and  Agree with Plan of Care Patient      Patient will benefit from skilled therapeutic intervention in order to improve the following deficits and impairments:  Abnormal gait, Cardiopulmonary status limiting activity, Decreased range of motion, Difficulty walking, Pain, Decreased endurance, Decreased activity tolerance, Decreased skin integrity, Impaired flexibility, Decreased scar mobility, Decreased mobility, Decreased strength, Increased edema  Visit Diagnosis: Pain in right knee  Stiffness of right knee, not elsewhere classified  Other abnormalities of gait and mobility     Problem List Patient Active Problem List   Diagnosis Date Noted  . Anemia of chronic disease   . LOC (loss of consciousness) 04/08/2016  . TIA (transient ischemic attack) 04/08/2016  . 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 S/P knee replacement 03/21/2016  . Anemia associated with chronic renal failure 10/28/2015  . Hyperparathyroidism , secondary, non-renal ([place]) 10/28/2015  . Pulmonary emboli ([place]) 08/27/2015  . Near syncope 08/25/2015  . DOE (dyspnea on exertion) 06/02/2015  . Controlled type 2 diabetes mellitus ([place]) 06/02/2015  . Melanoma, history of 10/09/2013  . 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 Chronic kidney disease, stage III (moderate) 10/09/2013  . Venous (peripheral) insufficiency 10/09/2013  . Statin intolerance 10/09/2013  . Prostate cancer ([place]) 12/25/2012    Wynelle Fanny, PTA 04/29/2016, 10:40 AM  [facility] [address] [place], [place], [address] Phone: [phone]   Fax:  [phone]  Name: Ricardo Mayer. MRN: [id number] Date of Birth: 12-Oct-1940

## 2016-05-02 ENCOUNTER — Ambulatory Visit: Payer: Medicare Other | Admitting: Physical Therapy

## 2016-05-02 ENCOUNTER — Encounter: Payer: Self-pay | Admitting: Physical Therapy

## 2016-05-02 DIAGNOSIS — R2689 Other abnormalities of gait and mobility: Secondary | ICD-10-CM | POA: Diagnosis not present

## 2016-05-02 DIAGNOSIS — M25661 Stiffness of right knee, not elsewhere classified: Secondary | ICD-10-CM

## 2016-05-02 DIAGNOSIS — M25561 Pain in right knee: Secondary | ICD-10-CM

## 2016-05-02 NOTE — Therapy (Addendum)
[facility] [place], [place], [address] Phone: [phone]   Fax:  [phone]  Physical Therapy Treatment  Patient Details  Name: Ricardo Mayer. MRN: [id number] Date of Birth: August 28, 1940 Referring Provider: Paralee Cancel, MD  Encounter Date: 05/02/2016      PT End of Session - 05/02/16 0946    Visit Number 15   Number of Visits 18   Date for PT Re-Evaluation 05/05/16   PT Start Time 0946   PT Stop Time 1035   PT Time Calculation (min) 49 min   Activity Tolerance Patient tolerated treatment well   Behavior During Therapy [facility] for tasks assessed/performed      Past Medical History:  Diagnosis Date  . Adenomatous polyp of colon   . Arthritis    bilateral knees  . CKD (chronic kidney disease), stage II    Dr. Patel,nephrology "stable last 3 yrs"  . Diabetes mellitus without complication ([place])    Dr. Laurance Flatten.PCP  . History of basal cell cancer   . History of DVT of lower extremity    2011--  LEFT LEG  . History of kidney stones   . History of melanoma excision    RIGHT ARM  . Hyperlipidemia   . Hypertension   . Impaired hearing    hearing aids bilateral  . Melanoma ([place]) 1990   arm and back -Melanoma, basal cell others- Dr. Edgardo Roys follows.  . Near syncope 08/25/2015   Dr. Roland Rack once"found blood clots in lung"  . Nocturia   . Prostate cancer [facility]) 2014   Dr. Cherylann Parr- "seed implant" '14  . Ulcer of left lower leg ([place])    recent pcp visit dr Laurance Flatten--  ordered betadine wet/dry dsg daily (03-18-2013)  [place] IN 1 WEEK    Past Surgical History:  Procedure Laterality Date  . KIDNEY STONE EXTRACTIONS  LAST ONE 1970   X2 OPEN/  X1  URETEROSCOPY  . MELANOMA EXCISION RIGHT ARM  1990  . PROSTATE BIOPSY    . RADIOACTIVE SEED IMPLANT N/A 03/21/2013   Procedure: RADIOACTIVE SEED IMPLANT;  Surgeon: Malka So, MD;  [facility];  Service: Urology;   Laterality: N/A;  87 seeds implanted    . TONSILLECTOMY    . TOTAL KNEE ARTHROPLASTY Right 03/21/2016   Procedure: RIGHT TOTAL KNEE ARTHROPLASTY;  Surgeon: Paralee Cancel, MD;  [facility];  Service: Orthopedics;  Laterality: Right;    There were no vitals filed for this visit.      Subjective Assessment - 05/02/16 0946    Subjective "I think I'm getting there. I only hurt when I bend it."   Pertinent History CKD, DM, hx basal cell cancer, HLD, HTN, DVT/PE   Limitations Standing;Walking   How long can you stand comfortably? 2-3 min   How long can you walk comfortably? 3-4 min   Patient Stated Goals improve motion and mobility, decrease pain   Currently in Pain? No/denies            [facility] PT Assessment - 05/02/16 0001      Assessment   Medical Diagnosis R TKA   Onset Date/Surgical Date 03/21/16   Next MD Visit 04/2016   Prior Therapy acute x 2 visits     Precautions   Precautions Fall     Restrictions   Weight Bearing Restrictions No     ROM / Strength   AROM / PROM / Strength AROM  AROM   Overall AROM  Deficits   AROM Assessment Site Knee   Right/Left Knee Right   Right Knee Extension -4   Right Knee Flexion 108                     [facility] Adult PT Treatment/Exercise - 05/02/16 0001      Knee/Hip Exercises: Aerobic   Nustep L7, seat 9 x15 min     Knee/Hip Exercises: Standing   Hip Flexion Stengthening;Right;2 sets;10 reps;Knee bent   Hip Flexion Limitations 3#   Hip Abduction Stengthening;Right;2 sets;10 reps;Knee straight   Abduction Limitations 3#   Hip Extension Stengthening;Right;2 sets;10 reps;Knee straight   Extension Limitations 3#   Forward Step Up Right;2 sets;10 reps;Hand Hold: 2;Step Height: 6"     Knee/Hip Exercises: Seated   Long Arc Quad Strengthening;Right;2 sets;10 reps;Weights   Long Arc Quad Weight 4 lbs.     Modalities   Modalities Cryotherapy     Cryotherapy   Number Minutes Cryotherapy 10 Minutes   Cryotherapy  Location Knee   Type of Cryotherapy Ice pack     Manual Therapy   Manual Therapy Soft tissue mobilization;Passive ROM   Soft tissue mobilization Incision and patella mobilizations to promote proper mobilty   Passive ROM PROM of R knee into flex/ext with gentle holds at end range                  PT Short Term Goals - 04/11/16 1246      PT SHORT TERM GOAL #1   Title independent with HEP (04/14/16)   Time 3   Period Weeks   Status Achieved           PT Long Term Goals - 04/27/16 1003      PT LONG TERM GOAL #1   Title improve R knee AROM 0-95 degrees for improved function (05/05/16)   Time 6   Period Weeks   Status On-going  AROM R knee measured as 3-100 deg 04/27/2016     PT LONG TERM GOAL #2   Title ambulate > 200' without device independently for improved function (05/05/16)   Time 6   Period Weeks   Status On-going  Patient now using [place] for ambulation 04/19/2016     PT LONG TERM GOAL #3   Title report pain < 2/10 with ADLs for improved function (05/05/16)   Time 6   Period Weeks   Status Achieved  Achieved per patient report 04/27/2016     PT LONG TERM GOAL #4   Title tolerate standing and walking > 10 min without increase in pain for improved function (05/05/16)   Time 6   Period Weeks   Status Achieved  Achieved per patient report 04/27/2016               Plan - 05/02/16 1027    Clinical Impression Statement Patient continues to arrive to clinic with only pain with knee flexion per patient report and is now using [facility] for ambulation. Patient able to complete R hip strengthening without complaint of discomfort. Patient able to tolerate 3# resistance for R hip strengthening today. Firm end feels noted with PROM of R knee into flex and extension. AROM R knee measured as 4-108 deg in supine today. Swelling continues to be present around R knee especially the patella making it hard to locate and determine patellar mobility today. Normal cryotherapy  response noted following removal of the modality.   Rehab Potential Good  PT Frequency 3x / week   PT Duration 6 weeks   PT Treatment/Interventions ADLs/Self Care Home Management;Cryotherapy;Functional mobility training;Stair training;Gait training;DME Instruction;Moist Heat;Therapeutic activities;Therapeutic exercise;Balance training;Neuromuscular re-education;Patient/family education;Passive range of motion;Scar mobilization;Manual techniques   PT Next Visit Plan cont with POC per TKR / NO VASOPNEUMATIC or ELECTRICAL STIMULATON per MPT   Consulted and Agree with Plan of Care Patient      Patient will benefit from skilled therapeutic intervention in order to improve the following deficits and impairments:  Abnormal gait, Cardiopulmonary status limiting activity, Decreased range of motion, Difficulty walking, Pain, Decreased endurance, Decreased activity tolerance, Decreased skin integrity, Impaired flexibility, Decreased scar mobility, Decreased mobility, Decreased strength, Increased edema  Visit Diagnosis: Pain in right knee  Stiffness of right knee, not elsewhere classified  Other abnormalities of gait and mobility     Problem List Patient Active Problem List   Diagnosis Date Noted  . Anemia of chronic disease   . LOC (loss of consciousness) 04/08/2016  . TIA (transient ischemic attack) 04/08/2016  . 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 S/P knee replacement 03/21/2016  . Anemia associated with chronic renal failure 10/28/2015  . Hyperparathyroidism , secondary, non-renal ([place]) 10/28/2015  . Pulmonary emboli ([place]) 08/27/2015  . Near syncope 08/25/2015  . DOE (dyspnea on exertion) 06/02/2015  . Controlled type 2 diabetes mellitus ([place]) 06/02/2015  . Melanoma, history of 10/09/2013  . 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 Chronic kidney disease, stage III (moderate) 10/09/2013  . Venous (peripheral) insufficiency 10/09/2013  . Statin  intolerance 10/09/2013  . Prostate cancer ([place]) 12/25/2012    Wynelle Fanny, PTA 05/02/2016, 10:37 AM  [facility] [address] [place], [place], [address] Phone: [phone]   Fax:  [phone]  Name: Emanuell Morina. MRN: [id number] Date of Birth: [birth year]

## 2016-05-04 ENCOUNTER — Ambulatory Visit: Payer: Medicare Other | Admitting: Physical Therapy

## 2016-05-04 ENCOUNTER — Encounter: Payer: Self-pay | Admitting: Physical Therapy

## 2016-05-04 DIAGNOSIS — M25661 Stiffness of right knee, not elsewhere classified: Secondary | ICD-10-CM | POA: Diagnosis not present

## 2016-05-04 DIAGNOSIS — M25561 Pain in right knee: Secondary | ICD-10-CM

## 2016-05-04 DIAGNOSIS — R2689 Other abnormalities of gait and mobility: Secondary | ICD-10-CM

## 2016-05-04 NOTE — Therapy (Signed)
[facility] [place], [place], [address] Phone: [phone]   Fax:  [phone]  Physical Therapy Treatment  Patient Details  Name: Ricardo Mayer. MRN: [id number] Date of Birth: 06/11/1941 Referring Provider: Paralee Cancel, MD  Encounter Date: 05/04/2016      PT End of Session - 05/04/16 0941    Visit Number 16   Number of Visits 18   Date for PT Re-Evaluation 05/05/16   PT Start Time 0947   PT Stop Time 1036   PT Time Calculation (min) 49 min   Activity Tolerance Patient tolerated treatment well   Behavior During Therapy [facility] for tasks assessed/performed      Past Medical History:  Diagnosis Date  . Adenomatous polyp of colon   . Arthritis    bilateral knees  . CKD (chronic kidney disease), stage II    Dr. Patel,nephrology "stable last 3 yrs"  . Diabetes mellitus without complication ([place])    Dr. Laurance Flatten.PCP  . History of basal cell cancer   . History of DVT of lower extremity    2011--  LEFT LEG  . History of kidney stones   . History of melanoma excision    RIGHT ARM  . Hyperlipidemia   . Hypertension   . Impaired hearing    hearing aids bilateral  . Melanoma ([place]) 1990   arm and back -Melanoma, basal cell others- Dr. Edgardo Roys follows.  . Near syncope 08/25/2015   Dr. Roland Rack once"found blood clots in lung"  . Nocturia   . Prostate cancer [facility]) 2014   Dr. Cherylann Parr- "seed implant" '14  . Ulcer of left lower leg ([place])    recent pcp visit dr Laurance Flatten--  ordered betadine wet/dry dsg daily (03-18-2013)  [place] IN 1 WEEK    Past Surgical History:  Procedure Laterality Date  . KIDNEY STONE EXTRACTIONS  LAST ONE 1970   X2 OPEN/  X1  URETEROSCOPY  . MELANOMA EXCISION RIGHT ARM  1990  . PROSTATE BIOPSY    . RADIOACTIVE SEED IMPLANT N/A 03/21/2013   Procedure: RADIOACTIVE SEED IMPLANT;  Surgeon: Malka So, MD;  Location: [facility];  Service: Urology;   Laterality: N/A;  87 seeds implanted    . TONSILLECTOMY    . TOTAL KNEE ARTHROPLASTY Right 03/21/2016   Procedure: RIGHT TOTAL KNEE ARTHROPLASTY;  Surgeon: Paralee Cancel, MD;  [facility];  Service: Orthopedics;  Laterality: Right;    There were no vitals filed for this visit.      Subjective Assessment - 05/04/16 0941    Subjective Reports stiffness upon waking in the morning and thinks that the swelling may be decreasing.   Pertinent History CKD, DM, hx basal cell cancer, HLD, HTN, DVT/PE   Limitations Standing;Walking   How long can you stand comfortably? 2-3 min   How long can you walk comfortably? 3-4 min   Patient Stated Goals improve motion and mobility, decrease pain   Currently in Pain? No/denies            [facility] PT Assessment - 05/04/16 0001      Assessment   Medical Diagnosis R TKA   Onset Date/Surgical Date 03/21/16   Next MD Visit 05/12/2016   Prior Therapy acute x 2 visits     Precautions   Precautions Fall     Restrictions   Weight Bearing Restrictions No  [place] Adult PT Treatment/Exercise - 05/04/16 0001      Knee/Hip Exercises: Aerobic   Nustep L7, seat 9 x16 min     Knee/Hip Exercises: Standing   Forward Lunges Right;2 sets;10 reps;2 seconds   Hip Abduction Stengthening;Right;2 sets;10 reps;Knee straight   Abduction Limitations 3#   Forward Step Up Right;2 sets;10 reps;Hand Hold: 2;Step Height: 6"   Rocker Board 3 minutes     Knee/Hip Exercises: Seated   Long Arc Quad Strengthening;Right;2 sets;10 reps;Weights   Long Arc Quad Weight 4 lbs.     Modalities   Modalities Cryotherapy     Cryotherapy   Number Minutes Cryotherapy 10 Minutes   Cryotherapy Location Knee   Type of Cryotherapy Ice pack     Manual Therapy   Manual Therapy Soft tissue mobilization;Passive ROM   Soft tissue mobilization Incision and patella mobilizations to promote proper mobilty; STW to R medial HS to decrease tightness   Passive  ROM PROM of R knee into flex/ext with gentle holds at end range                  PT Short Term Goals - 04/11/16 1246      PT SHORT TERM GOAL #1   Title independent with HEP (04/14/16)   Time 3   Period Weeks   Status Achieved           PT Long Term Goals - 04/27/16 1003      PT LONG TERM GOAL #1   Title improve R knee AROM 0-95 degrees for improved function (05/05/16)   Time 6   Period Weeks   Status On-going  AROM R knee measured as 3-100 deg 04/27/2016     PT LONG TERM GOAL #2   Title ambulate > 200' without device independently for improved function (05/05/16)   Time 6   Period Weeks   Status On-going  Patient now using [facility] for ambulation 04/19/2016     PT LONG TERM GOAL #3   Title report pain < 2/10 with ADLs for improved function (05/05/16)   Time 6   Period Weeks   Status Achieved  Achieved per patient report 04/27/2016     PT LONG TERM GOAL #4   Title tolerate standing and walking > 10 min without increase in pain for improved function (05/05/16)   Time 6   Period Weeks   Status Achieved  Achieved per patient report 04/27/2016               Plan - 05/04/16 1027    Clinical Impression Statement Patient arrived to treatment with decreased swelling observed around R knee. Patient denied pain and continues to utilize [facility] for ambulation. Patient continues to tolerate treatments well for R knee/hip strengthening. Firm end feels noted with PROM of R knee into flexion/extension today. Good R patellar and incision mobility noted today with manual therapy. R patella easier to locate and palpate today with decreased inflammation. Normal cryotherapy response noted following end of the session.   Rehab Potential Good   PT Frequency 3x / week   PT Duration 6 weeks   PT Treatment/Interventions ADLs/Self Care Home Management;Cryotherapy;Functional mobility training;Stair training;Gait training;DME Instruction;Moist Heat;Therapeutic activities;Therapeutic  exercise;Balance training;Neuromuscular re-education;Patient/family education;Passive range of motion;Scar mobilization;Manual techniques   PT Next Visit Plan cont with POC per TKR / NO VASOPNEUMATIC or ELECTRICAL STIMULATON per MPT      Patient will benefit from skilled therapeutic intervention in order to improve the following deficits and impairments:  Abnormal gait, Cardiopulmonary status limiting activity, Decreased range of motion, Difficulty walking, Pain, Decreased endurance, Decreased activity tolerance, Decreased skin integrity, Impaired flexibility, Decreased scar mobility, Decreased mobility, Decreased strength, Increased edema  Visit Diagnosis: Pain in right knee  Stiffness of right knee, not elsewhere classified  Other abnormalities of gait and mobility     Problem List Patient Active Problem List   Diagnosis Date Noted  . Anemia of chronic disease   . LOC (loss of consciousness) 04/08/2016  . TIA (transient ischemic attack) 04/08/2016  . 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 S/P knee replacement 03/21/2016  . Anemia associated with chronic renal failure 10/28/2015  . Hyperparathyroidism , secondary, non-renal ([place]) 10/28/2015  . Pulmonary emboli ([place]) 08/27/2015  . Near syncope 08/25/2015  . DOE (dyspnea on exertion) 06/02/2015  . Controlled type 2 diabetes mellitus ([place]) 06/02/2015  . Melanoma, history of 10/09/2013  . 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 Chronic kidney disease, stage III (moderate) 10/09/2013  . Venous (peripheral) insufficiency 10/09/2013  . Statin intolerance 10/09/2013  . Prostate cancer ([place]) 12/25/2012    Wynelle Fanny, PTA 05/04/2016, 10:38 AM  [facility] [address] [place], [place], [address] Phone: [phone]   Fax:  [phone]  Name: Ricardo Mayer. MRN: [id number] Date of Birth: Feb 13, 1941

## 2016-05-06 ENCOUNTER — Encounter: Payer: Self-pay | Admitting: Physical Therapy

## 2016-05-06 ENCOUNTER — Ambulatory Visit: Payer: Medicare Other | Admitting: Physical Therapy

## 2016-05-06 DIAGNOSIS — R2689 Other abnormalities of gait and mobility: Secondary | ICD-10-CM | POA: Diagnosis not present

## 2016-05-06 DIAGNOSIS — M25661 Stiffness of right knee, not elsewhere classified: Secondary | ICD-10-CM

## 2016-05-06 DIAGNOSIS — M25561 Pain in right knee: Secondary | ICD-10-CM

## 2016-05-06 NOTE — Therapy (Signed)
[facility] [place], [place], [address] Phone: [phone]   Fax:  [phone]  Physical Therapy Treatment  Patient Details  Name: Ricardo Mayer. MRN: [id number] Date of Birth: 09/02/1940 Referring Provider: Paralee Cancel, MD  Encounter Date: 05/06/2016      PT End of Session - 05/06/16 0947    Visit Number 17   Number of Visits 18   Date for PT Re-Evaluation 05/05/16   PT Start Time 0946   PT Stop Time 1038   PT Time Calculation (min) 52 min   Activity Tolerance Patient tolerated treatment well   Behavior During Therapy [facility] for tasks assessed/performed      Past Medical History:  Diagnosis Date  . Adenomatous polyp of colon   . Arthritis    bilateral knees  . CKD (chronic kidney disease), stage II    Dr. Patel,nephrology "stable last 3 yrs"  . Diabetes mellitus without complication ([place])    Dr. Laurance Flatten.PCP  . History of basal cell cancer   . History of DVT of lower extremity    2011--  LEFT LEG  . History of kidney stones   . History of melanoma excision    RIGHT ARM  . Hyperlipidemia   . Hypertension   . Impaired hearing    hearing aids bilateral  . Melanoma ([place]) 1990   arm and back -Melanoma, basal cell others- Dr. Edgardo Roys follows.  . Near syncope 08/25/2015   Dr. Roland Rack once"found blood clots in lung"  . Nocturia   . Prostate cancer [facility]) 2014   Dr. Cherylann Parr- "seed implant" '14  . Ulcer of left lower leg ([place])    recent pcp visit dr Laurance Flatten--  ordered betadine wet/dry dsg daily (03-18-2013)  [place] IN 1 WEEK    Past Surgical History:  Procedure Laterality Date  . KIDNEY STONE EXTRACTIONS  LAST ONE 1970   X2 OPEN/  X1  URETEROSCOPY  . MELANOMA EXCISION RIGHT ARM  1990  . PROSTATE BIOPSY    . RADIOACTIVE SEED IMPLANT N/A 03/21/2013   Procedure: RADIOACTIVE SEED IMPLANT;  Surgeon: Malka So, MD;  Location: [facility];  Service: Urology;   Laterality: N/A;  87 seeds implanted    . TONSILLECTOMY    . TOTAL KNEE ARTHROPLASTY Right 03/21/2016   Procedure: RIGHT TOTAL KNEE ARTHROPLASTY;  Surgeon: Paralee Cancel, MD;  Location: [facility];  Service: Orthopedics;  Laterality: Right;    There were no vitals filed for this visit.      Subjective Assessment - 05/06/16 0946    Subjective No new complaints today.   Pertinent History CKD, DM, hx basal cell cancer, HLD, HTN, DVT/PE   Limitations Standing;Walking   How long can you stand comfortably? 2-3 min   How long can you walk comfortably? 3-4 min   Patient Stated Goals improve motion and mobility, decrease pain            [facility] PT Assessment - 05/06/16 0001      Assessment   Medical Diagnosis R TKA   Onset Date/Surgical Date 03/21/16   Next MD Visit 05/12/2016   Prior Therapy acute x 2 visits     Precautions   Precautions Fall     Restrictions   Weight Bearing Restrictions No     ROM / Strength   AROM / PROM / Strength AROM     AROM   Overall AROM  Deficits   AROM Assessment Site Knee  Right/Left Knee Right   Right Knee Extension -7   Right Knee Flexion 106                     [facility] Adult PT Treatment/Exercise - 05/06/16 0001      Knee/Hip Exercises: Aerobic   Nustep L6, seat 10-12 x17 min to promote flexion and extension     Knee/Hip Exercises: Machines for Strengthening   Cybex Knee Extension 10# 4x10 reps   Cybex Knee Flexion 30# 3x10 reps     Knee/Hip Exercises: Standing   Forward Lunges Right;2 sets;10 reps;2 seconds   Forward Step Up Right;5 sets;10 reps;Hand Hold: 2;Step Height: 6"     Modalities   Modalities Cryotherapy     Cryotherapy   Number Minutes Cryotherapy 10 Minutes   Cryotherapy Location Knee   Type of Cryotherapy Ice pack     Manual Therapy   Manual Therapy Passive ROM   Passive ROM PROM of R knee into flex/ext with gentle holds at end range                  PT Short Term Goals - 04/11/16 1246       PT SHORT TERM GOAL #1   Title independent with HEP (04/14/16)   Time 3   Period Weeks   Status Achieved           PT Long Term Goals - 04/27/16 1003      PT LONG TERM GOAL #1   Title improve R knee AROM 0-95 degrees for improved function (05/05/16)   Time 6   Period Weeks   Status On-going  AROM R knee measured as 3-100 deg 04/27/2016     PT LONG TERM GOAL #2   Title ambulate > 200' without device independently for improved function (05/05/16)   Time 6   Period Weeks   Status On-going  Patient now using [place] for ambulation 04/19/2016     PT LONG TERM GOAL #3   Title report pain < 2/10 with ADLs for improved function (05/05/16)   Time 6   Period Weeks   Status Achieved  Achieved per patient report 04/27/2016     PT LONG TERM GOAL #4   Title tolerate standing and walking > 10 min without increase in pain for improved function (05/05/16)   Time 6   Period Weeks   Status Achieved  Achieved per patient report 04/27/2016               Plan - 05/06/16 1035    Clinical Impression Statement Patient arrived to treatment with only reports of stiffness. Patient interested in completing NuStep to continue improving R knee extension. Patient able to tolerate machine knee strengthening without complaint per patient. AROM R knee measured as 7-106 deg in supine. Normal cryotherapy response noted following removal of the modality.   Rehab Potential Good   PT Frequency 3x / week   PT Duration 6 weeks   PT Treatment/Interventions ADLs/Self Care Home Management;Cryotherapy;Functional mobility training;Stair training;Gait training;DME Instruction;Moist Heat;Therapeutic activities;Therapeutic exercise;Balance training;Neuromuscular re-education;Patient/family education;Passive range of motion;Scar mobilization;Manual techniques   PT Next Visit Plan cont with POC per TKR / NO VASOPNEUMATIC or ELECTRICAL STIMULATON per MPT   Consulted and Agree with Plan of Care Patient      Patient will  benefit from skilled therapeutic intervention in order to improve the following deficits and impairments:  Abnormal gait, Cardiopulmonary status limiting activity, Decreased range of motion, Difficulty walking, Pain, Decreased endurance,  Decreased activity tolerance, Decreased skin integrity, Impaired flexibility, Decreased scar mobility, Decreased mobility, Decreased strength, Increased edema  Visit Diagnosis: Pain in right knee  Stiffness of right knee, not elsewhere classified  Other abnormalities of gait and mobility     Problem List Patient Active Problem List   Diagnosis Date Noted  . Anemia of chronic disease   . LOC (loss of consciousness) 04/08/2016  . TIA (transient ischemic attack) 04/08/2016  . 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 S/P knee replacement 03/21/2016  . Anemia associated with chronic renal failure 10/28/2015  . Hyperparathyroidism , secondary, non-renal ([place]) 10/28/2015  . Pulmonary emboli ([place]) 08/27/2015  . Near syncope 08/25/2015  . DOE (dyspnea on exertion) 06/02/2015  . Controlled type 2 diabetes mellitus ([place]) 06/02/2015  . Melanoma, history of 10/09/2013  . 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 Chronic kidney disease, stage III (moderate) 10/09/2013  . Venous (peripheral) insufficiency 10/09/2013  . Statin intolerance 10/09/2013  . Prostate cancer ([place]) 12/25/2012    Wynelle Fanny, PTA 05/06/2016, 10:41 AM  [facility] [address] [place], [place], [address] Phone: [phone]   Fax:  [phone]  Name: Ricardo Mayer. MRN: [id number] Date of Birth: 14-Feb-1941

## 2016-05-09 ENCOUNTER — Ambulatory Visit: Payer: Medicare Other | Admitting: Physical Therapy

## 2016-05-09 ENCOUNTER — Encounter: Payer: Self-pay | Admitting: Physical Therapy

## 2016-05-09 DIAGNOSIS — R2689 Other abnormalities of gait and mobility: Secondary | ICD-10-CM

## 2016-05-09 DIAGNOSIS — M25661 Stiffness of right knee, not elsewhere classified: Secondary | ICD-10-CM

## 2016-05-09 DIAGNOSIS — M25561 Pain in right knee: Secondary | ICD-10-CM

## 2016-05-09 NOTE — Addendum Note (Signed)
Addended by: Yanelli Zapanta, [place] W on: 05/09/2016 11:55 AM   Modules accepted: Orders

## 2016-05-09 NOTE — Therapy (Signed)
[facility] [place], [place], [address] Phone: [phone]   Fax:  [phone]  Physical Therapy Treatment  Patient Details  Name: Ricardo Mayer. MRN: [id number] Date of Birth: July 11, 1941 Referring Provider: Paralee Cancel, MD  Encounter Date: 05/09/2016      PT End of Session - 05/09/16 1033    Visit Number 18   Number of Visits 18   Date for PT Re-Evaluation 05/05/16   PT Start Time 1032   PT Stop Time 1115   PT Time Calculation (min) 43 min   Activity Tolerance Patient tolerated treatment well   Behavior During Therapy [facility] for tasks assessed/performed      Past Medical History:  Diagnosis Date  . Adenomatous polyp of colon   . Arthritis    bilateral knees  . CKD (chronic kidney disease), stage II    Dr. Patel,nephrology "stable last 3 yrs"  . Diabetes mellitus without complication ([place])    Dr. Laurance Flatten.PCP  . History of basal cell cancer   . History of DVT of lower extremity    2011--  LEFT LEG  . History of kidney stones   . History of melanoma excision    RIGHT ARM  . Hyperlipidemia   . Hypertension   . Impaired hearing    hearing aids bilateral  . Melanoma ([place]) 1990   arm and back -Melanoma, basal cell others- Dr. Edgardo Roys follows.  . Near syncope 08/25/2015   Dr. Roland Rack once"found blood clots in lung"  . Nocturia   . Prostate cancer [facility]) 2014   Dr. Cherylann Parr- "seed implant" '14  . Ulcer of left lower leg ([place])    recent pcp visit dr Laurance Flatten--  ordered betadine wet/dry dsg daily (03-18-2013)  [place] IN 1 WEEK    Past Surgical History:  Procedure Laterality Date  . KIDNEY STONE EXTRACTIONS  LAST ONE 1970   X2 OPEN/  X1  URETEROSCOPY  . MELANOMA EXCISION RIGHT ARM  1990  . PROSTATE BIOPSY    . RADIOACTIVE SEED IMPLANT N/A 03/21/2013   Procedure: RADIOACTIVE SEED IMPLANT;  Surgeon: Malka So, MD;  Location: [facility];  Service: Urology;   Laterality: N/A;  87 seeds implanted    . TONSILLECTOMY    . TOTAL KNEE ARTHROPLASTY Right 03/21/2016   Procedure: RIGHT TOTAL KNEE ARTHROPLASTY;  Surgeon: Paralee Cancel, MD;  [facility];  Service: Orthopedics;  Laterality: Right;    There were no vitals filed for this visit.      Subjective Assessment - 05/09/16 1032    Subjective Reports he has increased his hours at work to about 6 hours per day. Reports that he is going up and down stairs at home and trying to walk longer distances without AD. States that he may have walked 100 ft today without AD.   Pertinent History CKD, DM, hx basal cell cancer, HLD, HTN, DVT/PE   Limitations Standing;Walking   How long can you stand comfortably? 30 minutes   How long can you walk comfortably? 45 minutes   Patient Stated Goals improve motion and mobility, decrease pain   Currently in Pain? No/denies            [facility] PT Assessment - 05/09/16 0001      Assessment   Medical Diagnosis R TKA   Onset Date/Surgical Date 03/21/16   Next MD Visit 05/12/2016   Prior Therapy acute x 2 visits     Precautions  Precautions Fall     Restrictions   Weight Bearing Restrictions No     ROM / Strength   AROM / PROM / Strength AROM     AROM   Overall AROM  Deficits   AROM Assessment Site Knee   Right/Left Knee Right   Right Knee Extension -7   Right Knee Flexion 106                     [facility] Adult PT Treatment/Exercise - 05/09/16 0001      Knee/Hip Exercises: Stretches   Active Hamstring Stretch Right;3 reps;30 seconds     Knee/Hip Exercises: Aerobic   Nustep L6, seat 14 x15 min to promote flexion and extension     Knee/Hip Exercises: Machines for Strengthening   Cybex Knee Extension 10# 5x10 reps   Cybex Knee Flexion 30# 5x10 reps     Knee/Hip Exercises: Standing   Forward Lunges Right;10 reps;3 sets;5 seconds   Rocker Board 3 minutes     Manual Therapy   Manual Therapy Passive ROM;Soft tissue mobilization   Soft  tissue mobilization Incision and patella mobilizations to promote proper mobilty; STW to R medial HS to decrease tightness   Passive ROM PROM of R knee into flex/ext with gentle holds at end range                  PT Short Term Goals - 04/11/16 1246      PT SHORT TERM GOAL #1   Title independent with HEP (04/14/16)   Time 3   Period Weeks   Status Achieved           PT Long Term Goals - 04/27/16 1003      PT LONG TERM GOAL #1   Title improve R knee AROM 0-95 degrees for improved function (05/05/16)   Time 6   Period Weeks   Status On-going  AROM R knee measured as 3-100 deg 04/27/2016     PT LONG TERM GOAL #2   Title ambulate > 200' without device independently for improved function (05/05/16)   Time 6   Period Weeks   Status On-going  Patient now using [place] for ambulation 04/19/2016     PT LONG TERM GOAL #3   Title report pain < 2/10 with ADLs for improved function (05/05/16)   Time 6   Period Weeks   Status Achieved  Achieved per patient report 04/27/2016     PT LONG TERM GOAL #4   Title tolerate standing and walking > 10 min without increase in pain for improved function (05/05/16)   Time 6   Period Weeks   Status Achieved  Achieved per patient report 04/27/2016               Plan - 05/09/16 1122    Clinical Impression Statement Patient arrived to treatment with no R knee pain and reports improvement with ability to ambulate without AD. Patient able to continue machine knee strenthening without complaint. Patient able to progress repititions himself without complaint. AROM R knee measured as 7-106 deg again in supine. Tightness of R medial HS noted today upon palpation as well as good R patella and incision mobility. Patient presents with continued area of R inferiolateral knee that soft with edema. Patient experienced tenderness with STW to R medial HS and soreness with PROM into extension today. Patient educated to continue icing R knee at home for 10-15  minutes at a time.   Rehab Potential  Good   PT Frequency 3x / week   PT Duration 6 weeks   PT Treatment/Interventions ADLs/Self Care Home Management;Cryotherapy;Functional mobility training;Stair training;Gait training;DME Instruction;Moist Heat;Therapeutic activities;Therapeutic exercise;Balance training;Neuromuscular re-education;Patient/family education;Passive range of motion;Scar mobilization;Manual techniques   PT Next Visit Plan cont with POC per TKR / NO VASOPNEUMATIC or ELECTRICAL STIMULATON per MPT   Consulted and Agree with Plan of Care Patient      Patient will benefit from skilled therapeutic intervention in order to improve the following deficits and impairments:  Abnormal gait, Cardiopulmonary status limiting activity, Decreased range of motion, Difficulty walking, Pain, Decreased endurance, Decreased activity tolerance, Decreased skin integrity, Impaired flexibility, Decreased scar mobility, Decreased mobility, Decreased strength, Increased edema  Visit Diagnosis: Pain in right knee  Stiffness of right knee, not elsewhere classified  Other abnormalities of gait and mobility     Problem List Patient Active Problem List   Diagnosis Date Noted  . Anemia of chronic disease   . LOC (loss of consciousness) 04/08/2016  . TIA (transient ischemic attack) 04/08/2016  . 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 S/P knee replacement 03/21/2016  . Anemia associated with chronic renal failure 10/28/2015  . Hyperparathyroidism , secondary, non-renal ([place]) 10/28/2015  . Pulmonary emboli ([place]) 08/27/2015  . Near syncope 08/25/2015  . DOE (dyspnea on exertion) 06/02/2015  . Controlled type 2 diabetes mellitus ([place]) 06/02/2015  . Melanoma, history of 10/09/2013  . 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 Chronic kidney disease, stage III (moderate) 10/09/2013  . Venous (peripheral) insufficiency 10/09/2013  . Statin intolerance 10/09/2013  . Prostate  cancer ([place]) 12/25/2012    Wynelle Fanny, PTA 05/09/2016, 11:31 AM  [facility] [address] [place], [place], [address] Phone: [phone]   Fax:  [phone]  Name: Ricardo Mayer. MRN: [id number] Date of Birth: 1940/11/03

## 2016-05-11 ENCOUNTER — Ambulatory Visit: Payer: Medicare Other | Admitting: Physical Therapy

## 2016-05-11 ENCOUNTER — Encounter: Payer: Self-pay | Admitting: Physical Therapy

## 2016-05-11 DIAGNOSIS — M25661 Stiffness of right knee, not elsewhere classified: Secondary | ICD-10-CM | POA: Diagnosis not present

## 2016-05-11 DIAGNOSIS — R2689 Other abnormalities of gait and mobility: Secondary | ICD-10-CM | POA: Diagnosis not present

## 2016-05-11 DIAGNOSIS — M25561 Pain in right knee: Secondary | ICD-10-CM

## 2016-05-11 NOTE — Therapy (Signed)
[facility] [place], [place], [address] Phone: [phone]   Fax:  [phone]  Physical Therapy Treatment  Patient Details  Name: Ricardo Mayer. MRN: [id number] Date of Birth: 1940-11-01 Referring Provider: Paralee Cancel, MD  Encounter Date: 05/11/2016      PT End of Session - 05/11/16 0940    Visit Number 19   Number of Visits 24   Date for PT Re-Evaluation 05/26/16   PT Start Time 0944   PT Stop Time 1040   PT Time Calculation (min) 56 min   Activity Tolerance Patient tolerated treatment well   Behavior During Therapy [facility] for tasks assessed/performed      Past Medical History:  Diagnosis Date  . Adenomatous polyp of colon   . Arthritis    bilateral knees  . CKD (chronic kidney disease), stage II    Dr. Patel,nephrology "stable last 3 yrs"  . Diabetes mellitus without complication ([place])    Dr. Laurance Flatten.PCP  . History of basal cell cancer   . History of DVT of lower extremity    2011--  LEFT LEG  . History of kidney stones   . History of melanoma excision    RIGHT ARM  . Hyperlipidemia   . Hypertension   . Impaired hearing    hearing aids bilateral  . Melanoma ([place]) 1990   arm and back -Melanoma, basal cell others- Dr. Edgardo Roys follows.  . Near syncope 08/25/2015   Dr. Roland Rack once"found blood clots in lung"  . Nocturia   . Prostate cancer [facility]) 2014   Dr. Cherylann Parr- "seed implant" '14  . Ulcer of left lower leg ([place])    recent pcp visit dr Laurance Flatten--  ordered betadine wet/dry dsg daily (03-18-2013)  [place] IN 1 WEEK    Past Surgical History:  Procedure Laterality Date  . KIDNEY STONE EXTRACTIONS  LAST ONE 1970   X2 OPEN/  X1  URETEROSCOPY  . MELANOMA EXCISION RIGHT ARM  1990  . PROSTATE BIOPSY    . RADIOACTIVE SEED IMPLANT N/A 03/21/2013   Procedure: RADIOACTIVE SEED IMPLANT;  Surgeon: Malka So, MD;  Location: [facility];  Service: Urology;   Laterality: N/A;  87 seeds implanted    . TONSILLECTOMY    . TOTAL KNEE ARTHROPLASTY Right 03/21/2016   Procedure: RIGHT TOTAL KNEE ARTHROPLASTY;  Surgeon: Paralee Cancel, MD;  [facility];  Service: Orthopedics;  Laterality: Right;    There were no vitals filed for this visit.      Subjective Assessment - 05/11/16 0939    Subjective Reports that he still has some stiffness in his knee in the mornings.   Pertinent History CKD, DM, hx basal cell cancer, HLD, HTN, DVT/PE   Limitations Standing;Walking   How long can you stand comfortably? 30 minutes   How long can you walk comfortably? 45 minutes   Patient Stated Goals improve motion and mobility, decrease pain   Currently in Pain? No/denies            [facility] PT Assessment - 05/11/16 0001      Assessment   Medical Diagnosis R TKA   Onset Date/Surgical Date 03/21/16   Next MD Visit 05/12/2016   Prior Therapy acute x 2 visits     Precautions   Precautions Fall     Restrictions   Weight Bearing Restrictions No     Observation/Other Assessments-Edema    Edema Circumferential     Circumferential Edema  Circumferential - Right 44.1   Circumferential - Left  41.6     ROM / Strength   AROM / PROM / Strength AROM;Strength     AROM   Overall AROM  Deficits   AROM Assessment Site Knee   Right/Left Knee Right   Right Knee Extension -8   Right Knee Flexion 107     Strength   Overall Strength Deficits;Within functional limits for tasks performed   Strength Assessment Site Knee   Right/Left Hip --   Right/Left Knee Right   Right Knee Flexion 4/5   Right Knee Extension 4+/5                     [facility] Adult PT Treatment/Exercise - 05/11/16 0001      Knee/Hip Exercises: Stretches   Active Hamstring Stretch Right;3 reps;30 seconds     Knee/Hip Exercises: Aerobic   Nustep L6, seat 8 x22 min to promote flexion     Knee/Hip Exercises: Machines for Strengthening   Cybex Knee Extension 10# 3x10 reps   Cybex  Knee Flexion 30# 3x10 reps     Knee/Hip Exercises: Standing   Forward Lunges Right;2 sets;10 reps;5 seconds   Rocker Board 2 minutes     Modalities   Modalities Cryotherapy     Cryotherapy   Number Minutes Cryotherapy 10 Minutes   Cryotherapy Location Knee   Type of Cryotherapy Ice pack     Manual Therapy   Manual Therapy Soft tissue mobilization   Soft tissue mobilization Incision and patella mobilizations to promote proper mobilty; STW to R medial HS to decrease tightness                  PT Short Term Goals - 04/11/16 1246      PT SHORT TERM GOAL #1   Title independent with HEP (04/14/16)   Time 3   Period Weeks   Status Achieved           PT Long Term Goals - 05/11/16 1111      PT LONG TERM GOAL #1   Title improve R knee AROM 0-95 degrees for improved function (05/05/16)   Time 6   Period Weeks   Status Partially Met  AROM R knee measured as 8-107 deg 05/11/2016     PT LONG TERM GOAL #2   Title ambulate > 200' without device independently for improved function (05/05/16)   Time 6   Period Weeks   Status Partially Met  Patient now using [place] intermittantly for ambulation 05/11/2016     PT LONG TERM GOAL #3   Title report pain < 2/10 with ADLs for improved function (05/05/16)   Time 6   Period Weeks   Status Achieved  Achieved per patient report 04/27/2016     PT LONG TERM GOAL #4   Title tolerate standing and walking > 10 min without increase in pain for improved function (05/05/16)   Time 6   Period Weeks   Status Achieved  Achieved per patient report 04/27/2016               Plan - 05/11/16 1112    Clinical Impression Statement Patient continues to present in clinic with only reports of R knee stiffness in the mornings. Patient utilizes [facility] for ambulation intermittantly per patient report. Patient able to tolerate longer duration on NuStep with increased resistance. Patient able to tolerate machine knee strengthening without complaint. R  knee MMT extensors 4+/5, flexors 4/5. AROM  R knee measured as 8-107 deg in supine with good patellar and incision mobility. Patient continues to present with R medial HS tightness upon palpation. Circumferential edema measurement measures as 2.5 cm difference with R >L. Normal cryotherapy response noted following removal of ice pack.   Rehab Potential Good   PT Frequency 3x / week   PT Duration 6 weeks   PT Treatment/Interventions ADLs/Self Care Home Management;Cryotherapy;Functional mobility training;Stair training;Gait training;DME Instruction;Moist Heat;Therapeutic activities;Therapeutic exercise;Balance training;Neuromuscular re-education;Patient/family education;Passive range of motion;Scar mobilization;Manual techniques   PT Next Visit Plan cont with POC per TKR / NO VASOPNEUMATIC or ELECTRICAL STIMULATON per MPT   Consulted and Agree with Plan of Care Patient      Patient will benefit from skilled therapeutic intervention in order to improve the following deficits and impairments:  Abnormal gait, Cardiopulmonary status limiting activity, Decreased range of motion, Difficulty walking, Pain, Decreased endurance, Decreased activity tolerance, Decreased skin integrity, Impaired flexibility, Decreased scar mobility, Decreased mobility, Decreased strength, Increased edema  Visit Diagnosis: Pain in right knee  Stiffness of right knee, not elsewhere classified  Other abnormalities of gait and mobility     Problem List Patient Active Problem List   Diagnosis Date Noted  . Anemia of chronic disease   . LOC (loss of consciousness) 04/08/2016  . TIA (transient ischemic attack) 04/08/2016  . 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 S/P knee replacement 03/21/2016  . Anemia associated with chronic renal failure 10/28/2015  . Hyperparathyroidism , secondary, non-renal ([place]) 10/28/2015  . Pulmonary emboli ([place]) 08/27/2015  . Near syncope 08/25/2015  . DOE (dyspnea on exertion) 06/02/2015  . Controlled  type 2 diabetes mellitus ([place]) 06/02/2015  . Melanoma, history of 10/09/2013  . 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 Chronic kidney disease, stage III (moderate) 10/09/2013  . Venous (peripheral) insufficiency 10/09/2013  . Statin intolerance 10/09/2013  . Prostate cancer [facility]) 12/25/2012    Ahmed Prima, PTA 05/11/16 12:09 PM [place] MPT [facility] [address] [place], [place], [address] Phone: [phone]   Fax:  [phone]  Name: Daelen Belvedere. MRN: [id number] Date of Birth: 12/27/1940

## 2016-05-12 DIAGNOSIS — Z96651 Presence of right artificial knee joint: Secondary | ICD-10-CM | POA: Diagnosis not present

## 2016-05-12 DIAGNOSIS — Z471 Aftercare following joint replacement surgery: Secondary | ICD-10-CM | POA: Diagnosis not present

## 2016-05-13 ENCOUNTER — Encounter: Payer: Self-pay | Admitting: Physical Therapy

## 2016-05-13 ENCOUNTER — Ambulatory Visit: Payer: Medicare Other | Admitting: Physical Therapy

## 2016-05-13 DIAGNOSIS — M25661 Stiffness of right knee, not elsewhere classified: Secondary | ICD-10-CM

## 2016-05-13 DIAGNOSIS — R2689 Other abnormalities of gait and mobility: Secondary | ICD-10-CM | POA: Diagnosis not present

## 2016-05-13 DIAGNOSIS — M25561 Pain in right knee: Secondary | ICD-10-CM

## 2016-05-13 NOTE — Therapy (Signed)
[facility] [place], [place], [address] Phone: [phone]   Fax:  [phone]  Physical Therapy Treatment  Patient Details  Name: Ricardo Mayer. MRN: [id number] Date of Birth: 28-Apr-1941 Referring Provider: Paralee Cancel, MD  Encounter Date: 05/13/2016      PT End of Session - 05/13/16 0945    Visit Number 20   Number of Visits 34   Date for PT Re-Evaluation 06/16/16  NO recieved on 05/13/2016 from Dr. Alvan Dame   PT Start Time [phone]   PT Stop Time 1040   PT Time Calculation (min) 52 min   Activity Tolerance Patient tolerated treatment well   Behavior During Therapy [facility] for tasks assessed/performed      Past Medical History:  Diagnosis Date  . Adenomatous polyp of colon   . Arthritis    bilateral knees  . CKD (chronic kidney disease), stage II    Dr. Patel,nephrology "stable last 3 yrs"  . Diabetes mellitus without complication ([place])    Dr. Laurance Flatten.PCP  . History of basal cell cancer   . History of DVT of lower extremity    2011--  LEFT LEG  . History of kidney stones   . History of melanoma excision    RIGHT ARM  . Hyperlipidemia   . Hypertension   . Impaired hearing    hearing aids bilateral  . Melanoma ([place]) 1990   arm and back -Melanoma, basal cell others- Dr. Edgardo Roys follows.  . Near syncope 08/25/2015   Dr. Roland Rack once"found blood clots in lung"  . Nocturia   . Prostate cancer [facility]) 2014   Dr. Cherylann Parr- "seed implant" '14  . Ulcer of left lower leg ([place])    recent pcp visit dr Laurance Flatten--  ordered betadine wet/dry dsg daily (03-18-2013)  [place] IN 1 WEEK    Past Surgical History:  Procedure Laterality Date  . KIDNEY STONE EXTRACTIONS  LAST ONE 1970   X2 OPEN/  X1  URETEROSCOPY  . MELANOMA EXCISION RIGHT ARM  1990  . PROSTATE BIOPSY    . RADIOACTIVE SEED IMPLANT N/A 03/21/2013   Procedure: RADIOACTIVE SEED IMPLANT;  Surgeon: Malka So, MD;  [facility]  Service: Urology;  Laterality: N/A;  87 seeds implanted    . TONSILLECTOMY    . TOTAL KNEE ARTHROPLASTY Right 03/21/2016   Procedure: RIGHT TOTAL KNEE ARTHROPLASTY;  Surgeon: Paralee Cancel, MD;  Location: [facility];  Service: Orthopedics;  Laterality: Right;    There were no vitals filed for this visit.      Subjective Assessment - 05/13/16 0945    Subjective States that MD wanted him to do more exercise at home up to 10x a day.   Pertinent History CKD, DM, hx basal cell cancer, HLD, HTN, DVT/PE   Limitations Standing;Walking   How long can you stand comfortably? 30 minutes   How long can you walk comfortably? 45 minutes   Patient Stated Goals improve motion and mobility, decrease pain   Currently in Pain? No/denies            [facility] PT Assessment - 05/13/16 0001      Assessment   Medical Diagnosis R TKA   Onset Date/Surgical Date 03/21/16   Next MD Visit 06/23/2016   Prior Therapy acute x 2 visits     Precautions   Precautions Fall     Restrictions   Weight Bearing Restrictions No  [place] Adult PT Treatment/Exercise - 05/13/16 0001      Knee/Hip Exercises: Stretches   Active Hamstring Stretch Right;3 reps;30 seconds     Knee/Hip Exercises: Aerobic   Nustep L7, seat 9 x15 min     Knee/Hip Exercises: Machines for Strengthening   Cybex Knee Extension 10# 3x10 reps   Cybex Knee Flexion 40# 3x10 reps     Knee/Hip Exercises: Standing   Hip Flexion Stengthening;Right;2 sets;10 reps;Knee bent   Hip Flexion Limitations 4#   Forward Lunges Right;2 sets;10 reps;5 seconds   Hip Abduction Stengthening;Right;2 sets;10 reps;Knee straight   Abduction Limitations 4#     Modalities   Modalities Cryotherapy     Cryotherapy   Number Minutes Cryotherapy 10 Minutes   Cryotherapy Location Knee   Type of Cryotherapy Ice pack     Manual Therapy   Manual Therapy Passive ROM;Soft tissue mobilization   Soft tissue mobilization STW to R  medial HS to decrease tightness   Passive ROM PROM of R knee into flex/ext with gentle holds at end range                  PT Short Term Goals - 04/11/16 1246      PT SHORT TERM GOAL #1   Title independent with HEP (04/14/16)   Time 3   Period Weeks   Status Achieved           PT Long Term Goals - 05/11/16 1111      PT LONG TERM GOAL #1   Title improve R knee AROM 0-95 degrees for improved function (05/05/16)   Time 6   Period Weeks   Status Partially Met  AROM R knee measured as 8-107 deg 05/11/2016     PT LONG TERM GOAL #2   Title ambulate > 200' without device independently for improved function (05/05/16)   Time 6   Period Weeks   Status Partially Met  Patient now using [facility] intermittantly for ambulation 05/11/2016     PT LONG TERM GOAL #3   Title report pain < 2/10 with ADLs for improved function (05/05/16)   Time 6   Period Weeks   Status Achieved  Achieved per patient report 04/27/2016     PT LONG TERM GOAL #4   Title tolerate standing and walking > 10 min without increase in pain for improved function (05/05/16)   Time 6   Period Weeks   Status Achieved  Achieved per patient report 04/27/2016               Plan - 05/13/16 1034    Clinical Impression Statement Patient continues to tolerate treatments well with no pain or discomfort reported prior to or during treatment. Patient requested resistance to increase with knee flexion machine strengthening today. ROM exercises were completed in efforts to continue progress towards ROM goals. Tightness continues to be noted in R medial HS with minimal release with manual therapy. Normal cryotherapy response noted following removal of the ice pack.   Rehab Potential Good   PT Frequency 3x / week   PT Duration 6 weeks   PT Treatment/Interventions ADLs/Self Care Home Management;Cryotherapy;Functional mobility training;Stair training;Gait training;DME Instruction;Moist Heat;Therapeutic activities;Therapeutic  exercise;Balance training;Neuromuscular re-education;Patient/family education;Passive range of motion;Scar mobilization;Manual techniques   PT Next Visit Plan cont with POC per TKR / NO VASOPNEUMATIC or ELECTRICAL STIMULATON per MPT   Consulted and Agree with Plan of Care Patient      Patient will benefit from skilled therapeutic intervention in  order to improve the following deficits and impairments:  Abnormal gait, Cardiopulmonary status limiting activity, Decreased range of motion, Difficulty walking, Pain, Decreased endurance, Decreased activity tolerance, Decreased skin integrity, Impaired flexibility, Decreased scar mobility, Decreased mobility, Decreased strength, Increased edema  Visit Diagnosis: Pain in right knee  Stiffness of right knee, not elsewhere classified  Other abnormalities of gait and mobility     Problem List Patient Active Problem List   Diagnosis Date Noted  . Anemia of chronic disease   . LOC (loss of consciousness) 04/08/2016  . TIA (transient ischemic attack) 04/08/2016  . 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 S/P knee replacement 03/21/2016  . Anemia associated with chronic renal failure 10/28/2015  . Hyperparathyroidism , secondary, non-renal ([place]) 10/28/2015  . Pulmonary emboli ([place]) 08/27/2015  . Near syncope 08/25/2015  . DOE (dyspnea on exertion) 06/02/2015  . Controlled type 2 diabetes mellitus ([place]) 06/02/2015  . Melanoma, history of 10/09/2013  . 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 Chronic kidney disease, stage III (moderate) 10/09/2013  . Venous (peripheral) insufficiency 10/09/2013  . Statin intolerance 10/09/2013  . Prostate cancer ([place]) 12/25/2012    Wynelle Fanny, PTA 05/13/2016, 10:45 AM  [facility] [address] [place], [place], [address] Phone: [phone]   Fax:  [phone]  Name: Ricardo Mayer. MRN: [id number] Date of Birth:  Jan 06, 1941

## 2016-05-16 ENCOUNTER — Ambulatory Visit: Payer: Medicare Other | Attending: Orthopedic Surgery | Admitting: Physical Therapy

## 2016-05-16 DIAGNOSIS — M25661 Stiffness of right knee, not elsewhere classified: Secondary | ICD-10-CM | POA: Diagnosis not present

## 2016-05-16 DIAGNOSIS — R2689 Other abnormalities of gait and mobility: Secondary | ICD-10-CM | POA: Insufficient documentation

## 2016-05-16 DIAGNOSIS — M25561 Pain in right knee: Secondary | ICD-10-CM | POA: Diagnosis not present

## 2016-05-16 NOTE — Therapy (Signed)
[facility] [place], [place], [address] Phone: [phone]   Fax:  [phone]  Physical Therapy Treatment  Patient Details  Name: Ricardo Mayer. MRN: [id number] Date of Birth: 15-Oct-1940 Referring Provider: Paralee Cancel, MD  Encounter Date: 05/16/2016      PT End of Session - 05/16/16 1034    Visit Number 21   Number of Visits 34   Date for PT Re-Evaluation 06/16/16   PT Start Time 0945   PT Stop Time 1034   PT Time Calculation (min) 49 min   Activity Tolerance Patient tolerated treatment well   Behavior During Therapy [facility] for tasks assessed/performed      Past Medical History:  Diagnosis Date  . Adenomatous polyp of colon   . Arthritis    bilateral knees  . CKD (chronic kidney disease), stage II    Dr. Patel,nephrology "stable last 3 yrs"  . Diabetes mellitus without complication ([place])    Dr. Laurance Flatten.PCP  . History of basal cell cancer   . History of DVT of lower extremity    2011--  LEFT LEG  . History of kidney stones   . History of melanoma excision    RIGHT ARM  . Hyperlipidemia   . Hypertension   . Impaired hearing    hearing aids bilateral  . Melanoma ([place]) 1990   arm and back -Melanoma, basal cell others- Dr. Edgardo Roys follows.  . Near syncope 08/25/2015   Dr. Roland Rack once"found blood clots in lung"  . Nocturia   . Prostate cancer [facility]) 2014   Dr. Cherylann Parr- "seed implant" '14  . Ulcer of left lower leg ([place])    recent pcp visit dr Laurance Flatten--  ordered betadine wet/dry dsg daily (03-18-2013)  [place] IN 1 WEEK    Past Surgical History:  Procedure Laterality Date  . KIDNEY STONE EXTRACTIONS  LAST ONE 1970   X2 OPEN/  X1  URETEROSCOPY  . MELANOMA EXCISION RIGHT ARM  1990  . PROSTATE BIOPSY    . RADIOACTIVE SEED IMPLANT N/A 03/21/2013   Procedure: RADIOACTIVE SEED IMPLANT;  Surgeon: Malka So, MD;  Location: [facility];  Service: Urology;   Laterality: N/A;  87 seeds implanted    . TONSILLECTOMY    . TOTAL KNEE ARTHROPLASTY Right 03/21/2016   Procedure: RIGHT TOTAL KNEE ARTHROPLASTY;  Surgeon: Paralee Cancel, MD;  [facility];  Service: Orthopedics;  Laterality: Right;    There were no vitals filed for this visit.      Subjective Assessment - 05/16/16 1033    Subjective No new complaints.   Pain Location Knee   Pain Orientation Right   Pain Descriptors / Indicators Aching   Pain Type Surgical pain   Pain Onset 1 to 4 weeks ago    Treatment:  Nustep level 5 x 20 minutes f/b PROM into right knee flexion and extension x 18 minutes.  Excellent job today.                               PT Short Term Goals - 04/11/16 1246      PT SHORT TERM GOAL #1   Title independent with HEP (04/14/16)   Time 3   Period Weeks   Status Achieved           PT Long Term Goals - 05/11/16 1111      PT LONG TERM GOAL #  1   Title improve R knee AROM 0-95 degrees for improved function (05/05/16)   Time 6   Period Weeks   Status Partially Met  AROM R knee measured as 8-107 deg 05/11/2016     PT LONG TERM GOAL #2   Title ambulate > 200' without device independently for improved function (05/05/16)   Time 6   Period Weeks   Status Partially Met  Patient now using [place] intermittantly for ambulation 05/11/2016     PT LONG TERM GOAL #3   Title report pain < 2/10 with ADLs for improved function (05/05/16)   Time 6   Period Weeks   Status Achieved  Achieved per patient report 04/27/2016     PT LONG TERM GOAL #4   Title tolerate standing and walking > 10 min without increase in pain for improved function (05/05/16)   Time 6   Period Weeks   Status Achieved  Achieved per patient report 04/27/2016             Patient will benefit from skilled therapeutic intervention in order to improve the following deficits and impairments:  Abnormal gait, Cardiopulmonary status limiting activity, Decreased range of  motion, Difficulty walking, Pain, Decreased endurance, Decreased activity tolerance, Decreased skin integrity, Impaired flexibility, Decreased scar mobility, Decreased mobility, Decreased strength, Increased edema  Visit Diagnosis: Acute pain of right knee  Stiffness of right knee, not elsewhere classified  Other abnormalities of gait and mobility     Problem List Patient Active Problem List   Diagnosis Date Noted  . Anemia of chronic disease   . LOC (loss of consciousness) ([place]) 04/08/2016  . TIA (transient ischemic attack) 04/08/2016  . 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 S/P knee replacement 03/21/2016  . Anemia associated with chronic renal failure 10/28/2015  . Hyperparathyroidism , secondary, non-renal ([place]) 10/28/2015  . Pulmonary emboli ([place]) 08/27/2015  . Near syncope 08/25/2015  . DOE (dyspnea on exertion) 06/02/2015  . Controlled type 2 diabetes mellitus ([place]) 06/02/2015  . Melanoma, history of 10/09/2013  . 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 Chronic kidney disease, stage III (moderate) 10/09/2013  . Venous (peripheral) insufficiency 10/09/2013  . Statin intolerance 10/09/2013  . Prostate cancer ([place]) 12/25/2012    Calynn Ferrero, [place] 05/16/2016, 10:36 AM  [facility] [address] [place], [place], [address] Phone: [phone]   Fax:  [phone]  Name: Ricardo Mayer. MRN: [id number] Date of Birth: [birth year]

## 2016-05-18 ENCOUNTER — Encounter: Payer: Self-pay | Admitting: Physical Therapy

## 2016-05-18 ENCOUNTER — Ambulatory Visit: Payer: Medicare Other | Admitting: Physical Therapy

## 2016-05-18 DIAGNOSIS — R2689 Other abnormalities of gait and mobility: Secondary | ICD-10-CM | POA: Diagnosis not present

## 2016-05-18 DIAGNOSIS — M25661 Stiffness of right knee, not elsewhere classified: Secondary | ICD-10-CM

## 2016-05-18 DIAGNOSIS — M25561 Pain in right knee: Secondary | ICD-10-CM

## 2016-05-18 NOTE — Therapy (Signed)
[facility] [place], [place], [address] Phone: [phone]   Fax:  [phone]  Physical Therapy Treatment  Patient Details  Name: Ricardo Mayer. MRN: [id number] Date of Birth: Oct 11, 1940 Referring Provider: Paralee Cancel, MD  Encounter Date: 05/18/2016      PT End of Session - 05/18/16 1025    Visit Number 22   Number of Visits 34   Date for PT Re-Evaluation 06/16/16   PT Start Time 0944   PT Stop Time 1040   PT Time Calculation (min) 56 min   Activity Tolerance Patient tolerated treatment well   Behavior During Therapy [facility] for tasks assessed/performed      Past Medical History:  Diagnosis Date  . Adenomatous polyp of colon   . Arthritis    bilateral knees  . CKD (chronic kidney disease), stage II    Dr. Patel,nephrology "stable last 3 yrs"  . Diabetes mellitus without complication ([place])    Dr. Laurance Flatten.PCP  . History of basal cell cancer   . History of DVT of lower extremity    2011--  LEFT LEG  . History of kidney stones   . History of melanoma excision    RIGHT ARM  . Hyperlipidemia   . Hypertension   . Impaired hearing    hearing aids bilateral  . Melanoma ([place]) 1990   arm and back -Melanoma, basal cell others- Dr. Edgardo Roys follows.  . Near syncope 08/25/2015   Dr. Roland Rack once"found blood clots in lung"  . Nocturia   . Prostate cancer [facility]) 2014   Dr. Cherylann Parr- "seed implant" '14  . Ulcer of left lower leg ([place])    recent pcp visit dr Laurance Flatten--  ordered betadine wet/dry dsg daily (03-18-2013)  [place] IN 1 WEEK    Past Surgical History:  Procedure Laterality Date  . KIDNEY STONE EXTRACTIONS  LAST ONE 1970   X2 OPEN/  X1  URETEROSCOPY  . MELANOMA EXCISION RIGHT ARM  1990  . PROSTATE BIOPSY    . RADIOACTIVE SEED IMPLANT N/A 03/21/2013   Procedure: RADIOACTIVE SEED IMPLANT;  Surgeon: Malka So, MD;  Location: [facility];  Service: Urology;   Laterality: N/A;  87 seeds implanted    . TONSILLECTOMY    . TOTAL KNEE ARTHROPLASTY Right 03/21/2016   Procedure: RIGHT TOTAL KNEE ARTHROPLASTY;  Surgeon: Paralee Cancel, MD;  [facility];  Service: Orthopedics;  Laterality: Right;    There were no vitals filed for this visit.      Subjective Assessment - 05/18/16 1001    Subjective Patient reported some increased stiffness and soreness today in knee   Patient is accompained by: Family member   Pertinent History CKD, DM, hx basal cell cancer, HLD, HTN, DVT/PE   Limitations Standing;Walking   How long can you stand comfortably? 30 minutes   How long can you walk comfortably? 45 minutes   Patient Stated Goals improve motion and mobility, decrease pain   Currently in Pain? Yes   Pain Score 3    Pain Location Knee   Pain Orientation Right   Pain Descriptors / Indicators Aching   Pain Type Surgical pain   Pain Onset More than a month ago   Pain Frequency Intermittent   Aggravating Factors  prolong activity or ROM    Pain Relieving Factors at rest            [facility] PT Assessment - 05/18/16 0001  AROM   AROM Assessment Site Knee   Right/Left Knee Right   Right Knee Extension -11   Right Knee Flexion 100     PROM   PROM Assessment Site Knee   Right/Left Knee Right   Right Knee Extension -7   Right Knee Flexion 110                     [facility] Adult PT Treatment/Exercise - 05/18/16 0001      Knee/Hip Exercises: Stretches   Knee: Self-Stretch to increase Flexion Right;3 reps;30 seconds     Knee/Hip Exercises: Aerobic   Nustep 7mn L5 UE/LE     Knee/Hip Exercises: Standing   Lateral Step Up Right;10 reps;2 sets;Hand Hold: 2;Step Height: 6"   Forward Step Up Right;3 sets;10 reps;Hand Hold: 1;Step Height: 6"   Rocker Board 3 minutes     Manual Therapy   Manual Therapy Passive ROM   Passive ROM PROM of R knee into flex/ext with gentle holds at end range                  PT Short Term  Goals - 04/11/16 1246      PT SHORT TERM GOAL #1   Title independent with HEP (04/14/16)   Time 3   Period Weeks   Status Achieved           PT Long Term Goals - 05/18/16 1026      PT LONG TERM GOAL #1   Title improve R knee AROM 0-95 degrees for improved function (05/05/16)   Time 6   Period Weeks   Status Partially Met  AROM -11-100 05/18/16     PT LONG TERM GOAL #2   Title ambulate > 200' without device independently for improved function (05/05/16)   Time 6   Period Weeks   Status Partially Met  using cane intermittently 05/18/16     PT LONG TERM GOAL #3   Title report pain < 2/10 with ADLs for improved function (05/05/16)   Period Weeks   Status Achieved     PT LONG TERM GOAL #4   Title tolerate standing and walking > 10 min without increase in pain for improved function (05/05/16)   Time 6   Period Weeks   Status Achieved               Plan - 05/18/16 1027    Clinical Impression Statement Patient reported improvement overall yet ongoing stiffness in knee and more soreness due to stretching knee more often at home per MD. Patient tolerated treatment well today. Patient progressing toward goals yet ongoing due to ROM deficits and using cane at times for safety and support. Patient has difficulty with all ADL's due to knee limitations and continues with an antalgic gait due to pain and ROM deficts.   Rehab Potential Good   PT Frequency 3x / week   PT Duration 6 weeks   PT Treatment/Interventions ADLs/Self Care Home Management;Cryotherapy;Functional mobility training;Stair training;Gait training;DME Instruction;Moist Heat;Therapeutic activities;Therapeutic exercise;Balance training;Neuromuscular re-education;Patient/family education;Passive range of motion;Scar mobilization;Manual techniques   PT Next Visit Plan cont with POC per TKR / NO VASOPNEUMATIC or ELECTRICAL STIMULATON per MPT   Consulted and Agree with Plan of Care Patient      Patient will benefit from  skilled therapeutic intervention in order to improve the following deficits and impairments:  Abnormal gait, Cardiopulmonary status limiting activity, Decreased range of motion, Difficulty walking, Pain, Decreased endurance, Decreased activity tolerance, Decreased  skin integrity, Impaired flexibility, Decreased scar mobility, Decreased mobility, Decreased strength, Increased edema  Visit Diagnosis: Acute pain of right knee  Stiffness of right knee, not elsewhere classified  Other abnormalities of gait and mobility     Problem List Patient Active Problem List   Diagnosis Date Noted  . Anemia of chronic disease   . LOC (loss of consciousness) ([place]) 04/08/2016  . TIA (transient ischemic attack) 04/08/2016  . 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 S/P knee replacement 03/21/2016  . Anemia associated with chronic renal failure 10/28/2015  . Hyperparathyroidism , secondary, non-renal ([place]) 10/28/2015  . Pulmonary emboli ([place]) 08/27/2015  . Near syncope 08/25/2015  . DOE (dyspnea on exertion) 06/02/2015  . Controlled type 2 diabetes mellitus ([place]) 06/02/2015  . Melanoma, history of 10/09/2013  . 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 Chronic kidney disease, stage III (moderate) 10/09/2013  . Venous (peripheral) insufficiency 10/09/2013  . Statin intolerance 10/09/2013  . Prostate cancer ([place]) 12/25/2012    Margeret Stachnik P, PTA 05/18/2016, 10:48 AM  [facility] [place], [place], [address] Phone: [phone]   Fax:  [phone]  Name: Ricardo Mayer. MRN: [id number] Date of Birth: May 09, 1941

## 2016-05-20 ENCOUNTER — Ambulatory Visit: Payer: Medicare Other | Admitting: Physical Therapy

## 2016-05-20 DIAGNOSIS — M25661 Stiffness of right knee, not elsewhere classified: Secondary | ICD-10-CM

## 2016-05-20 DIAGNOSIS — M25561 Pain in right knee: Secondary | ICD-10-CM | POA: Diagnosis not present

## 2016-05-20 DIAGNOSIS — Z23 Encounter for immunization: Secondary | ICD-10-CM | POA: Diagnosis not present

## 2016-05-20 DIAGNOSIS — R2689 Other abnormalities of gait and mobility: Secondary | ICD-10-CM | POA: Diagnosis not present

## 2016-05-20 NOTE — Therapy (Addendum)
[facility] [place], [place], [address] Phone: [phone]   Fax:  [phone]  Physical Therapy Treatment  Patient Details  Name: Ricardo Mayer. MRN: [id number] Date of Birth: Mar 11, 1941 Referring Provider: Paralee Cancel, MD  Encounter Date: 05/20/2016      PT End of Session - 05/20/16 1247    Visit Number 23   Number of Visits 34   Date for PT Re-Evaluation 06/16/16   PT Start Time 0945   PT Stop Time 1043   PT Time Calculation (min) 58 min   Activity Tolerance Patient tolerated treatment well   Behavior During Therapy [facility] for tasks assessed/performed      Past Medical History:  Diagnosis Date  . Adenomatous polyp of colon   . Arthritis    bilateral knees  . CKD (chronic kidney disease), stage II    Dr. Patel,nephrology "stable last 3 yrs"  . Diabetes mellitus without complication ([place])    Dr. Laurance Flatten.PCP  . History of basal cell cancer   . History of DVT of lower extremity    2011--  LEFT LEG  . History of kidney stones   . History of melanoma excision    RIGHT ARM  . Hyperlipidemia   . Hypertension   . Impaired hearing    hearing aids bilateral  . Melanoma ([place]) 1990   arm and back -Melanoma, basal cell others- Dr. Edgardo Roys follows.  . Near syncope 08/25/2015   Dr. Roland Rack once"found blood clots in lung"  . Nocturia   . Prostate cancer [facility]) 2014   Dr. Cherylann Parr- "seed implant" '14  . Ulcer of left lower leg ([place])    recent pcp visit dr Laurance Flatten--  ordered betadine wet/dry dsg daily (03-18-2013)  [place] IN 1 WEEK    Past Surgical History:  Procedure Laterality Date  . KIDNEY STONE EXTRACTIONS  LAST ONE 1970   X2 OPEN/  X1  URETEROSCOPY  . MELANOMA EXCISION RIGHT ARM  1990  . PROSTATE BIOPSY    . RADIOACTIVE SEED IMPLANT N/A 03/21/2013   Procedure: RADIOACTIVE SEED IMPLANT;  Surgeon: Malka So, MD;  Location: [facility];  Service: Urology;   Laterality: N/A;  87 seeds implanted    . TONSILLECTOMY    . TOTAL KNEE ARTHROPLASTY Right 03/21/2016   Procedure: RIGHT TOTAL KNEE ARTHROPLASTY;  Surgeon: Paralee Cancel, MD;  Location: [facility];  Service: Orthopedics;  Laterality: Right;    There were no vitals filed for this visit.      Subjective Assessment - 05/20/16 1226    Subjective I'm back to driving and doing well.  I've been to work for 3 weeks.   Pain Score 3    Pain Location Knee   Pain Orientation Right   Pain Descriptors / Indicators Aching   Pain Type Surgical pain   Pain Onset More than a month ago     Treatment:  Nustep with close seat x 10 minutes for flexion and seat back for extension x 10 minutes f/b PROM x 18 minutes low load long duration stretching into flexion and extension f/b CP x 10 minutes.                                PT Short Term Goals - 04/11/16 1246      PT SHORT TERM GOAL #1   Title independent with HEP (04/14/16)   Time  3   Period Weeks   Status Achieved           PT Long Term Goals - 05/18/16 1026      PT LONG TERM GOAL #1   Title improve R knee AROM 0-95 degrees for improved function (05/05/16)   Time 6   Period Weeks   Status Partially Met  AROM -11-100 05/18/16     PT LONG TERM GOAL #2   Title ambulate > 200' without device independently for improved function (05/05/16)   Time 6   Period Weeks   Status Partially Met  using cane intermittently 05/18/16     PT LONG TERM GOAL #3   Title report pain < 2/10 with ADLs for improved function (05/05/16)   Period Weeks   Status Achieved     PT LONG TERM GOAL #4   Title tolerate standing and walking > 10 min without increase in pain for improved function (05/05/16)   Time 6   Period Weeks   Status Achieved             Patient will benefit from skilled therapeutic intervention in order to improve the following deficits and impairments:  Abnormal gait, Cardiopulmonary status limiting activity, Decreased  range of motion, Difficulty walking, Pain, Decreased endurance, Decreased activity tolerance, Decreased skin integrity, Impaired flexibility, Decreased scar mobility, Decreased mobility, Decreased strength, Increased edema  Visit Diagnosis: Acute pain of right knee  Stiffness of right knee, not elsewhere classified     Problem List Patient Active Problem List   Diagnosis Date Noted  . Anemia of chronic disease   . LOC (loss of consciousness) ([place]) 04/08/2016  . TIA (transient ischemic attack) 04/08/2016  . 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 S/P knee replacement 03/21/2016  . Anemia associated with chronic renal failure 10/28/2015  . Hyperparathyroidism , secondary, non-renal ([place]) 10/28/2015  . Pulmonary emboli ([place]) 08/27/2015  . Near syncope 08/25/2015  . DOE (dyspnea on exertion) 06/02/2015  . Controlled type 2 diabetes mellitus ([place]) 06/02/2015  . Melanoma, history of 10/09/2013  . 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 Chronic kidney disease, stage III (moderate) 10/09/2013  . Venous (peripheral) insufficiency 10/09/2013  . Statin intolerance 10/09/2013  . Prostate cancer ([place]) 12/25/2012    [place], [place] 05/20/2016, 12:47 PM  [facility] [address] [place], [place], [address] Phone: [phone]   Fax:  [phone]  Name: Ricardo Mayer. MRN: [id number] Date of Birth: 02/23/1941

## 2016-05-23 ENCOUNTER — Ambulatory Visit: Payer: Medicare Other | Admitting: Physical Therapy

## 2016-05-23 ENCOUNTER — Encounter: Payer: Self-pay | Admitting: Physical Therapy

## 2016-05-23 DIAGNOSIS — M25561 Pain in right knee: Secondary | ICD-10-CM

## 2016-05-23 DIAGNOSIS — R2689 Other abnormalities of gait and mobility: Secondary | ICD-10-CM | POA: Diagnosis not present

## 2016-05-23 DIAGNOSIS — M25661 Stiffness of right knee, not elsewhere classified: Secondary | ICD-10-CM | POA: Diagnosis not present

## 2016-05-23 NOTE — Therapy (Signed)
[facility] [place], [place], [address] Phone: [phone]   Fax:  [phone]  Physical Therapy Treatment  Patient Details  Name: Ricardo Mayer. MRN: [id number] Date of Birth: Jan 13, 1941 Referring Provider: Paralee Cancel, MD  Encounter Date: 05/23/2016      PT End of Session - 05/23/16 0940    Visit Number 24   Number of Visits 34   Date for PT Re-Evaluation 06/16/16   PT Start Time 0947   PT Stop Time 1036   PT Time Calculation (min) 49 min   Activity Tolerance Patient tolerated treatment well   Behavior During Therapy [facility] for tasks assessed/performed      Past Medical History:  Diagnosis Date  . Adenomatous polyp of colon   . Arthritis    bilateral knees  . CKD (chronic kidney disease), stage II    Dr. Patel,nephrology "stable last 3 yrs"  . Diabetes mellitus without complication ([place])    Dr. Laurance Flatten.PCP  . History of basal cell cancer   . History of DVT of lower extremity    2011--  LEFT LEG  . History of kidney stones   . History of melanoma excision    RIGHT ARM  . Hyperlipidemia   . Hypertension   . Impaired hearing    hearing aids bilateral  . Melanoma ([place]) 1990   arm and back -Melanoma, basal cell others- Dr. Edgardo Roys follows.  . Near syncope 08/25/2015   Dr. Roland Rack once"found blood clots in lung"  . Nocturia   . Prostate cancer [facility]) 2014   Dr. Cherylann Parr- "seed implant" '14  . Ulcer of left lower leg ([place])    recent pcp visit dr Laurance Flatten--  ordered betadine wet/dry dsg daily (03-18-2013)  [place] IN 1 WEEK    Past Surgical History:  Procedure Laterality Date  . KIDNEY STONE EXTRACTIONS  LAST ONE 1970   X2 OPEN/  X1  URETEROSCOPY  . MELANOMA EXCISION RIGHT ARM  1990  . PROSTATE BIOPSY    . RADIOACTIVE SEED IMPLANT N/A 03/21/2013   Procedure: RADIOACTIVE SEED IMPLANT;  Surgeon: Malka So, MD;  Location: [facility];  Service: Urology;   Laterality: N/A;  87 seeds implanted    . TONSILLECTOMY    . TOTAL KNEE ARTHROPLASTY Right 03/21/2016   Procedure: RIGHT TOTAL KNEE ARTHROPLASTY;  Surgeon: Paralee Cancel, MD;  Location: [facility];  Service: Orthopedics;  Laterality: Right;    There were no vitals filed for this visit.      Subjective Assessment - 05/23/16 0940    Subjective Reports that he has more discomfort and inflammation following doing prone hang over the weekend.   Pertinent History CKD, DM, hx basal cell cancer, HLD, HTN, DVT/PE   Limitations Standing;Walking   How long can you stand comfortably? 30 minutes   How long can you walk comfortably? 45 minutes   Patient Stated Goals improve motion and mobility, decrease pain   Currently in Pain? Yes   Pain Score --  Unable to give numerical rating but reported "not much"   Pain Location Knee   Pain Orientation Right   Pain Descriptors / Indicators Discomfort   Pain Type Surgical pain   Pain Onset More than a month ago   Pain Frequency Intermittent  with weightbearing            [facility] PT Assessment - 05/23/16 0001      Assessment   Medical Diagnosis R TKA  Onset Date/Surgical Date 03/21/16   Next MD Visit 06/23/2016   Prior Therapy acute x 2 visits     Precautions   Precautions Fall     Restrictions   Weight Bearing Restrictions No                     [facility] Adult PT Treatment/Exercise - 05/23/16 0001      Knee/Hip Exercises: Stretches   Active Hamstring Stretch Right;3 reps;20 seconds     Knee/Hip Exercises: Aerobic   Nustep L7, seat 7 x15 min     Knee/Hip Exercises: Machines for Strengthening   Cybex Knee Extension 10# 3x10 reps   Cybex Knee Flexion 40# 3x10 reps     Knee/Hip Exercises: Standing   Forward Lunges Right;2 sets;10 reps;3 seconds   Terminal Knee Extension Limitations Green theraband RLE 2x10 reps   Lateral Step Up Right;10 reps;2 sets;Hand Hold: 2;Step Height: 6"   Forward Step Up Right;3 sets;10 reps;Hand Hold:  2;Step Height: 6"     Knee/Hip Exercises: Seated   Long Arc Quad Strengthening;Left;3 sets;10 reps;Weights   Long Arc Quad Weight 4 lbs.     Modalities   Modalities Cryotherapy     Cryotherapy   Number Minutes Cryotherapy 10 Minutes   Cryotherapy Location Knee   Type of Cryotherapy Ice pack     Manual Therapy   Manual Therapy Passive ROM   Passive ROM PROM of R knee into flex/ext with gentle holds at end range                  PT Short Term Goals - 04/11/16 1246      PT SHORT TERM GOAL #1   Title independent with HEP (04/14/16)   Time 3   Period Weeks   Status Achieved           PT Long Term Goals - 05/23/16 1018      PT LONG TERM GOAL #1   Title improve R knee AROM 0-95 degrees for improved function (05/05/16)   Time 6   Period Weeks   Status Partially Met  AROM -11-100 05/18/16     PT LONG TERM GOAL #2   Title ambulate > 200' without device independently for improved function (05/05/16)   Time 6   Period Weeks   Status Achieved     PT LONG TERM GOAL #3   Title report pain < 2/10 with ADLs for improved function (05/05/16)   Period Weeks   Status Achieved     PT LONG TERM GOAL #4   Title tolerate standing and walking > 10 min without increase in pain for improved function (05/05/16)   Time 6   Period Weeks   Status Achieved               Plan - 05/23/16 1029    Clinical Impression Statement Patient tolerated today's treatment fairly well as he arrived with increased R knee discomfort and inflammation since completing prone hang. No increased knee discomfort reported by patient with therapeutic exercises. Stationary bike was attempted today but unable secondary to initial tightness thus patient was moved to Nustep again. Inflammation noted around R knee and into lower leg with hand hold during PROM of R knee. Normal cryotherapy response noted following removal of the modalities. Patient has now met all goals set at evaluation as patient now uses  no AD unless he feels he needs it on uneven surface.   Rehab Potential Good   PT  Frequency 3x / week   PT Duration 6 weeks   PT Treatment/Interventions ADLs/Self Care Home Management;Cryotherapy;Functional mobility training;Stair training;Gait training;DME Instruction;Moist Heat;Therapeutic activities;Therapeutic exercise;Balance training;Neuromuscular re-education;Patient/family education;Passive range of motion;Scar mobilization;Manual techniques   PT Next Visit Plan cont with POC per TKR / NO VASOPNEUMATIC or ELECTRICAL STIMULATON per MPT   Consulted and Agree with Plan of Care Patient      Patient will benefit from skilled therapeutic intervention in order to improve the following deficits and impairments:  Abnormal gait, Cardiopulmonary status limiting activity, Decreased range of motion, Difficulty walking, Pain, Decreased endurance, Decreased activity tolerance, Decreased skin integrity, Impaired flexibility, Decreased scar mobility, Decreased mobility, Decreased strength, Increased edema  Visit Diagnosis: Acute pain of right knee  Stiffness of right knee, not elsewhere classified  Other abnormalities of gait and mobility     Problem List Patient Active Problem List   Diagnosis Date Noted  . Anemia of chronic disease   . LOC (loss of consciousness) ([place]) 04/08/2016  . TIA (transient ischemic attack) 04/08/2016  . 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 S/P knee replacement 03/21/2016  . Anemia associated with chronic renal failure 10/28/2015  . Hyperparathyroidism , secondary, non-renal ([place]) 10/28/2015  . Pulmonary emboli ([place]) 08/27/2015  . Near syncope 08/25/2015  . DOE (dyspnea on exertion) 06/02/2015  . Controlled type 2 diabetes mellitus ([place]) 06/02/2015  . Melanoma, history of 10/09/2013  . 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 Chronic kidney disease, stage III (moderate) 10/09/2013  . Venous (peripheral) insufficiency 10/09/2013  .  Statin intolerance 10/09/2013  . Prostate cancer ([place]) 12/25/2012    Wynelle Fanny, PTA 05/23/2016, 10:52 AM  [facility] [address] [place], [place], [address] Phone: [phone]   Fax:  [phone]  Name: Keawe Marcello. MRN: [id number] Date of Birth: 12/17/1940

## 2016-05-25 ENCOUNTER — Encounter: Payer: Self-pay | Admitting: Physical Therapy

## 2016-05-25 ENCOUNTER — Ambulatory Visit: Payer: Medicare Other | Admitting: Physical Therapy

## 2016-05-25 DIAGNOSIS — M25661 Stiffness of right knee, not elsewhere classified: Secondary | ICD-10-CM

## 2016-05-25 DIAGNOSIS — R2689 Other abnormalities of gait and mobility: Secondary | ICD-10-CM | POA: Diagnosis not present

## 2016-05-25 DIAGNOSIS — M25561 Pain in right knee: Secondary | ICD-10-CM | POA: Diagnosis not present

## 2016-05-25 NOTE — Therapy (Signed)
[facility] [place], [place], [address] Phone: [phone]   Fax:  [phone]  Physical Therapy Treatment  Patient Details  Name: Ricardo Mayer. MRN: [id number] Date of Birth: [birth year] Referring Provider: Paralee Cancel, MD  Encounter Date: 05/25/2016      PT End of Session - 05/25/16 0947    Visit Number 25   Number of Visits 34   Date for PT Re-Evaluation 06/16/16   PT Start Time 0946   PT Stop Time 1031   PT Time Calculation (min) 45 min   Activity Tolerance Patient tolerated treatment well   Behavior During Therapy [facility] for tasks assessed/performed      Past Medical History:  Diagnosis Date  . Adenomatous polyp of colon   . Arthritis    bilateral knees  . CKD (chronic kidney disease), stage II    Dr. Patel,nephrology "stable last 3 yrs"  . Diabetes mellitus without complication ([place])    Dr. Laurance Flatten.PCP  . History of basal cell cancer   . History of DVT of lower extremity    2011--  LEFT LEG  . History of kidney stones   . History of melanoma excision    RIGHT ARM  . Hyperlipidemia   . Hypertension   . Impaired hearing    hearing aids bilateral  . Melanoma ([place]) 1990   arm and back -Melanoma, basal cell others- Dr. Edgardo Roys follows.  . Near syncope 08/25/2015   Dr. Roland Rack once"found blood clots in lung"  . Nocturia   . Prostate cancer [facility]) 2014   Dr. Cherylann Parr- "seed implant" '14  . Ulcer of left lower leg ([place])    recent pcp visit dr Laurance Flatten--  ordered betadine wet/dry dsg daily (03-18-2013)  [place] IN 1 WEEK    Past Surgical History:  Procedure Laterality Date  . KIDNEY STONE EXTRACTIONS  LAST ONE 1970   X2 OPEN/  X1  URETEROSCOPY  . MELANOMA EXCISION RIGHT ARM  1990  . PROSTATE BIOPSY    . RADIOACTIVE SEED IMPLANT N/A 03/21/2013   Procedure: RADIOACTIVE SEED IMPLANT;  Surgeon: Malka So, MD;  Location: [facility];  Service: Urology;   Laterality: N/A;  87 seeds implanted    . TONSILLECTOMY    . TOTAL KNEE ARTHROPLASTY Right 03/21/2016   Procedure: RIGHT TOTAL KNEE ARTHROPLASTY;  Surgeon: Paralee Cancel, MD;  Location: [facility];  Service: Orthopedics;  Laterality: Right;    There were no vitals filed for this visit.      Subjective Assessment - 05/25/16 0947    Subjective Reports knee not as sore as he was Monday.   Pertinent History CKD, DM, hx basal cell cancer, HLD, HTN, DVT/PE   Limitations Standing;Walking   How long can you stand comfortably? 30 minutes   How long can you walk comfortably? 45 minutes   Patient Stated Goals improve motion and mobility, decrease pain   Currently in Pain? No/denies            [facility] PT Assessment - 05/25/16 0001      Assessment   Medical Diagnosis R TKA   Onset Date/Surgical Date 03/21/16   Next MD Visit 06/23/2016   Prior Therapy acute x 2 visits     Precautions   Precautions Fall     Restrictions   Weight Bearing Restrictions No                     [facility]  Adult PT Treatment/Exercise - 05/25/16 0001      Knee/Hip Exercises: Aerobic   Nustep L7, seat 7 x20 min     Knee/Hip Exercises: Machines for Strengthening   Cybex Knee Extension 20# 4x10 reps   Cybex Knee Flexion 40# 3x10 reps   Cybex Leg Press 2 pl, seat 7, 3x10 reps     Knee/Hip Exercises: Standing   Step Down Right;3 sets;10 reps;Hand Hold: 2;Step Height: 6"     Modalities   Modalities Cryotherapy     Cryotherapy   Number Minutes Cryotherapy 10 Minutes   Cryotherapy Location Knee   Type of Cryotherapy Ice pack                  PT Short Term Goals - 04/11/16 1246      PT SHORT TERM GOAL #1   Title independent with HEP (04/14/16)   Time 3   Period Weeks   Status Achieved           PT Long Term Goals - 05/23/16 1018      PT LONG TERM GOAL #1   Title improve R knee AROM 0-95 degrees for improved function (05/05/16)   Time 6   Period Weeks   Status Partially Met   AROM -11-100 05/18/16     PT LONG TERM GOAL #2   Title ambulate > 200' without device independently for improved function (05/05/16)   Time 6   Period Weeks   Status Achieved     PT LONG TERM GOAL #3   Title report pain < 2/10 with ADLs for improved function (05/05/16)   Period Weeks   Status Achieved     PT LONG TERM GOAL #4   Title tolerate standing and walking > 10 min without increase in pain for improved function (05/05/16)   Time 6   Period Weeks   Status Achieved               Plan - 05/25/16 1024    Clinical Impression Statement Patient tolerated today's machine strengthening well with a focus on strengthening R Quad to improve R knee extension. Patient's R knee remains inflammed with increased swelling visibly. Patient able to advance NuStep seat as he felt comfortable to increase flexion. Patient was introduced to step downs to further increase R knee extension without complaint. Patient did not report any knee discomfort with any of the exercises completed today. Normal cryotherapy response noted following removal of the modality.   Rehab Potential Good   PT Frequency 3x / week   PT Duration 6 weeks   PT Treatment/Interventions ADLs/Self Care Home Management;Cryotherapy;Functional mobility training;Stair training;Gait training;DME Instruction;Moist Heat;Therapeutic activities;Therapeutic exercise;Balance training;Neuromuscular re-education;Patient/family education;Passive range of motion;Scar mobilization;Manual techniques   PT Next Visit Plan cont with POC per TKR / NO VASOPNEUMATIC or ELECTRICAL STIMULATON per MPT. Reassess R knee ROM next treatment.   Consulted and Agree with Plan of Care Patient      Patient will benefit from skilled therapeutic intervention in order to improve the following deficits and impairments:  Abnormal gait, Cardiopulmonary status limiting activity, Decreased range of motion, Difficulty walking, Pain, Decreased endurance, Decreased activity  tolerance, Decreased skin integrity, Impaired flexibility, Decreased scar mobility, Decreased mobility, Decreased strength, Increased edema  Visit Diagnosis: Acute pain of right knee  Stiffness of right knee, not elsewhere classified  Other abnormalities of gait and mobility     Problem List Patient Active Problem List   Diagnosis Date Noted  . Anemia of chronic disease   .  LOC (loss of consciousness) ([place]) 04/08/2016  . TIA (transient ischemic attack) 04/08/2016  . 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 S/P knee replacement 03/21/2016  . Anemia associated with chronic renal failure 10/28/2015  . Hyperparathyroidism , secondary, non-renal ([place]) 10/28/2015  . Pulmonary emboli ([place]) 08/27/2015  . Near syncope 08/25/2015  . DOE (dyspnea on exertion) 06/02/2015  . Controlled type 2 diabetes mellitus ([place]) 06/02/2015  . Melanoma, history of 10/09/2013  . 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 Chronic kidney disease, stage III (moderate) 10/09/2013  . Venous (peripheral) insufficiency 10/09/2013  . Statin intolerance 10/09/2013  . Prostate cancer ([place]) 12/25/2012    Wynelle Fanny, PTA 05/25/2016, 10:37 AM  [facility] [address] [place], [place], [address] Phone: [phone]   Fax:  [phone]  Name: Tai Skelly. MRN: [id number] Date of Birth: 09-05-1940

## 2016-05-26 ENCOUNTER — Encounter: Payer: Self-pay | Admitting: Internal Medicine

## 2016-05-26 ENCOUNTER — Ambulatory Visit (INDEPENDENT_AMBULATORY_CARE_PROVIDER_SITE_OTHER): Payer: Medicare Other | Admitting: Internal Medicine

## 2016-05-26 VITALS — BP 120/70 | HR 87 | Ht 67.0 in | Wt 226.0 lb

## 2016-05-26 DIAGNOSIS — I2699 Other pulmonary embolism without acute cor pulmonale: Secondary | ICD-10-CM | POA: Diagnosis not present

## 2016-05-26 NOTE — Patient Instructions (Signed)
No need pulmonary follow up needed unless your breathing is limiting you   You will need to be on xarelto or something indefinitely

## 2016-05-26 NOTE — Progress Notes (Signed)
Subjective:    Patient ID: Ricardo Mayer., male    DOB: February 27, 1941,     MRN: [id number]  HPI  60 yowm never smoker with new onset sob/presyncope oct 2016 with ECcho 06/16/15 ok x min AR/ LAE eventually dx with R DVT/ PE 08/27/15  in setting of previous h/o L dvt in [place] / venous ulcers on L with Reflux by venous doppler/ 2014 Early and transiently on coumadin but started on xarelto and referred to pulmonary clinic 05/26/2016 by Dr   Laurance Flatten for evaluation doing very wll on xarelto since dx      05/26/2016 1st [facility] office visit/ Indigo Barbian   Chief Complaint  Patient presents with  . Pulmonary Consult    Pt states referred by Dr. [name]. He had DVT and PE in Feb 2017. He was having SOB at that time but has not had any problems recently.   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 No unusual exp hx or h/o childhood pna/ asthma or knowledge of premature birth.  Sleeping ok without nocturnal  or early am exacerbation  of respiratory  c/o's or need for noct saba. Also denies any obvious fluctuation of symptoms with weather or environmental changes or other aggravating or alleviating factors except as outlined above   Current Medications, Allergies, Complete Past Medical History, Past Surgical History, Family History, and Social History were reviewed in [employer] record.     Review of Systems  Constitutional: Negative for activity change, appetite change, chills, fever and unexpected weight change.  HENT: Negative for congestion, dental problem, postnasal drip, rhinorrhea, sneezing, sore throat, trouble swallowing and  voice change.   Eyes: Negative for visual disturbance.  Respiratory: Negative for cough, choking and shortness of breath.   Cardiovascular: Negative for chest pain and leg swelling.  Gastrointestinal: Negative for abdominal pain, nausea and vomiting.  Genitourinary: Negative for difficulty urinating.  Musculoskeletal: Negative for arthralgias.  Skin: Negative for rash.  Psychiatric/Behavioral: Negative for behavioral problems and confusion.       Objective:   Physical Exam  amb wm nad  Wt Readings from Last 3 Encounters:  05/26/16 226 lb (102.5 kg)  04/08/16 230 lb (104.3 kg)  04/08/16 230 lb (104.3 kg)    Vital signs reviewed - Note on arrival 02 sats  99% on RA     HEENT: nl dentition, turbinates, and oropharynx. Nl external ear canals without cough reflex   NECK :  without JVD/Nodes/TM/ nl carotid upstrokes bilaterally   LUNGS: no acc muscle use,  Nl contour chest which is clear to A and P bilaterally without cough on insp or exp maneuvers   CV:  RRR  no s3 or murmur or increase in P2, R > L pitting edema both lower ext/ wearing elastic stockings  ABD:  soft and nontender with nl inspiratory excursion in the supine position. No bruits or organomegaly, bowel sounds nl  MS:  Nl gait/ ext warm without deformities, calf tenderness, cyanosis or clubbing No obvious joint restrictions   SKIN: warm and dry without lesions  NEURO:  alert, approp, nl sensorium with  no motor deficits     I personally reviewed images and agree with radiology impression as follows:      V/Q 04/08/16 with nl perfusion  Neg v/q      Assessment & Plan:

## 2016-05-27 ENCOUNTER — Ambulatory Visit: Payer: Medicare Other | Admitting: Physical Therapy

## 2016-05-27 ENCOUNTER — Encounter: Payer: Self-pay | Admitting: Physical Therapy

## 2016-05-27 DIAGNOSIS — M25561 Pain in right knee: Secondary | ICD-10-CM

## 2016-05-27 DIAGNOSIS — R2689 Other abnormalities of gait and mobility: Secondary | ICD-10-CM

## 2016-05-27 DIAGNOSIS — M25661 Stiffness of right knee, not elsewhere classified: Secondary | ICD-10-CM | POA: Diagnosis not present

## 2016-05-27 NOTE — Assessment & Plan Note (Signed)
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 The only options I see for him in the future might be to change to a "maint dose" of xarelto if/when that is approved and his venous studies turn neg and ideally loses some wt int he meantime.  Neither applies now so rec full dose xarelto indefinitely.  Would repeat echo if any limiting sob or worse leg swelling looking for [facility] - however, the main w/u for that is v/q which has already been done > 6 m out from PE and is neg so no need to do that now    Total time devoted to counseling  = 35/51m review case with pt/ discussion of options/alternatives/ personally creating written instructions  in presence of pt  then going over those specific  Instructions directly with the pt including how to use all of the meds but in particular covering each new medication in detail and the difference between the maintenance/automatic meds and the prns using an action plan format for the latter.

## 2016-05-27 NOTE — Therapy (Signed)
[facility] [place], [place], [address] Phone: [phone]   Fax:  [phone]  Physical Therapy Treatment  Patient Details  Name: Ricardo Mayer. MRN: [id number] Date of Birth: 1941-03-24 Referring Provider: Paralee Cancel, MD  Encounter Date: 05/27/2016      PT End of Session - 05/27/16 0948    Visit Number 26   Number of Visits 34   Date for PT Re-Evaluation 06/16/16   PT Start Time 0945   PT Stop Time 1324   PT Time Calculation (min) 50 min   Activity Tolerance Patient tolerated treatment well   Behavior During Therapy [facility] for tasks assessed/performed      Past Medical History:  Diagnosis Date  . Adenomatous polyp of colon   . Arthritis    bilateral knees  . CKD (chronic kidney disease), stage II    Dr. Patel,nephrology "stable last 3 yrs"  . Diabetes mellitus without complication ([place])    Dr. Laurance Flatten.PCP  . History of basal cell cancer   . History of DVT of lower extremity    2011--  LEFT LEG  . History of kidney stones   . History of melanoma excision    RIGHT ARM  . Hyperlipidemia   . Hypertension   . Impaired hearing    hearing aids bilateral  . Melanoma ([place]) 1990   arm and back -Melanoma, basal cell others- Dr. Edgardo Roys follows.  . Near syncope 08/25/2015   Dr. Roland Rack once"found blood clots in lung"  . Nocturia   . Prostate cancer [facility]) 2014   Dr. Cherylann Parr- "seed implant" '14  . Ulcer of left lower leg ([place])    recent pcp visit dr Laurance Flatten--  ordered betadine wet/dry dsg daily (03-18-2013)  [place] IN 1 WEEK    Past Surgical History:  Procedure Laterality Date  . KIDNEY STONE EXTRACTIONS  LAST ONE 1970   X2 OPEN/  X1  URETEROSCOPY  . MELANOMA EXCISION RIGHT ARM  1990  . PROSTATE BIOPSY    . RADIOACTIVE SEED IMPLANT N/A 03/21/2013   Procedure: RADIOACTIVE SEED IMPLANT;  Surgeon: Malka So, MD;  Location: [facility];  Service: Urology;   Laterality: N/A;  87 seeds implanted    . TONSILLECTOMY    . TOTAL KNEE ARTHROPLASTY Right 03/21/2016   Procedure: RIGHT TOTAL KNEE ARTHROPLASTY;  Surgeon: Paralee Cancel, MD;  Location: [facility];  Service: Orthopedics;  Laterality: Right;    There were no vitals filed for this visit.      Subjective Assessment - 05/27/16 0948    Subjective Reports that R knee is okay today.   Pertinent History CKD, DM, hx basal cell cancer, HLD, HTN, DVT/PE   Limitations Standing;Walking   How long can you stand comfortably? 30 minutes   How long can you walk comfortably? 45 minutes   Patient Stated Goals improve motion and mobility, decrease pain   Currently in Pain? No/denies            [facility] PT Assessment - 05/27/16 0001      Assessment   Medical Diagnosis R TKA   Onset Date/Surgical Date 03/21/16   Next MD Visit 06/23/2016   Prior Therapy acute x 2 visits     Precautions   Precautions Fall     Restrictions   Weight Bearing Restrictions No     ROM / Strength   AROM / PROM / Strength AROM     AROM   Overall  AROM  Deficits   AROM Assessment Site Knee   Right/Left Knee Right   Right Knee Extension -11     PROM   Overall PROM  Deficits   PROM Assessment Site Knee   Right/Left Knee Right   Right Knee Extension -7                     [facility] Adult PT Treatment/Exercise - 05/27/16 0001      Knee/Hip Exercises: Aerobic   Nustep L8, seat 8 x19 min     Knee/Hip Exercises: Machines for Strengthening   Cybex Knee Extension 20# 3x13 reps   Cybex Knee Flexion 40# 3x13 reps   Cybex Leg Press 2 pl, seat 7, 3x10 reps     Modalities   Modalities Cryotherapy     Cryotherapy   Number Minutes Cryotherapy 10 Minutes   Cryotherapy Location Knee   Type of Cryotherapy Ice pack     Manual Therapy   Manual Therapy Soft tissue mobilization;Passive ROM   Soft tissue mobilization STW to R medial HS, adductors to decrease tightness; R patellar mobilizations in sup/inf, med/lat to  promote proper mobility   Passive ROM PROM of R knee into ext with gentle holds at end range                  PT Short Term Goals - 04/11/16 1246      PT SHORT TERM GOAL #1   Title independent with HEP (04/14/16)   Time 3   Period Weeks   Status Achieved           PT Long Term Goals - 05/23/16 1018      PT LONG TERM GOAL #1   Title improve R knee AROM 0-95 degrees for improved function (05/05/16)   Time 6   Period Weeks   Status Partially Met  AROM -11-100 05/18/16     PT LONG TERM GOAL #2   Title ambulate > 200' without device independently for improved function (05/05/16)   Time 6   Period Weeks   Status Achieved     PT LONG TERM GOAL #3   Title report pain < 2/10 with ADLs for improved function (05/05/16)   Period Weeks   Status Achieved     PT LONG TERM GOAL #4   Title tolerate standing and walking > 10 min without increase in pain for improved function (05/05/16)   Time 6   Period Weeks   Status Achieved               Plan - 05/27/16 1028    Clinical Impression Statement Patient tolerated today's treatment well with no complaints throughout treatment. Patient able to tolerate increased repititions of NuStep and all machine strengthening. Firm end feels noted with PROM of R knee into extension and with tightness in R hip adductors and medial HS. AROM R knee extension measured as -11 deg from neutral and PROM measured as -7 deg from neutral in extension. Normal cryotherapy response noted following removal of the modalities.   Rehab Potential Good   PT Frequency 3x / week   PT Duration 6 weeks   PT Treatment/Interventions ADLs/Self Care Home Management;Cryotherapy;Functional mobility training;Stair training;Gait training;DME Instruction;Moist Heat;Therapeutic activities;Therapeutic exercise;Balance training;Neuromuscular re-education;Patient/family education;Passive range of motion;Scar mobilization;Manual techniques   PT Next Visit Plan cont with POC  per TKR / NO VASOPNEUMATIC or ELECTRICAL STIMULATON per MPT.    Consulted and Agree with Plan of Care Patient  Patient will benefit from skilled therapeutic intervention in order to improve the following deficits and impairments:  Abnormal gait, Cardiopulmonary status limiting activity, Decreased range of motion, Difficulty walking, Pain, Decreased endurance, Decreased activity tolerance, Decreased skin integrity, Impaired flexibility, Decreased scar mobility, Decreased mobility, Decreased strength, Increased edema  Visit Diagnosis: Acute pain of right knee  Stiffness of right knee, not elsewhere classified  Other abnormalities of gait and mobility     Problem List Patient Active Problem List   Diagnosis Date Noted  . Morbid obesity due to excess calories ([place]) 05/27/2016  . Anemia of chronic disease   . LOC (loss of consciousness) ([place]) 04/08/2016  . TIA (transient ischemic attack) 04/08/2016  . 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 S/P knee replacement 03/21/2016  . Anemia associated with chronic renal failure 10/28/2015  . Hyperparathyroidism , secondary, non-renal ([place]) 10/28/2015  . Pulmonary emboli ([place]) 08/27/2015  . Near syncope 08/25/2015  . DOE (dyspnea on exertion) 06/02/2015  . Controlled type 2 diabetes mellitus ([place]) 06/02/2015  . Melanoma, history of 10/09/2013  . 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 Chronic kidney disease, stage III (moderate) 10/09/2013  . Venous (peripheral) insufficiency 10/09/2013  . Statin intolerance 10/09/2013  . Prostate cancer ([place]) 12/25/2012    Wynelle Fanny, PTA 05/27/2016, 11:05 AM  [facility] [address] [place], [place], [address] Phone: [phone]   Fax:  [phone]  Name: Ricardo Mayer. MRN: [id number] Date of Birth: 10/20/1940

## 2016-05-27 NOTE — Assessment & Plan Note (Signed)
Body mass index is 35.4  Lab Results  Component Value Date   TSH 2.930 08/28/2014     Contributing to venous insufficiency/ reviewed the need and the process to achieve and maintain neg calorie balance > defer f/u primary care including intermittently monitoring thyroid status

## 2016-05-30 ENCOUNTER — Ambulatory Visit: Payer: Medicare Other | Admitting: Physical Therapy

## 2016-05-30 DIAGNOSIS — M25661 Stiffness of right knee, not elsewhere classified: Secondary | ICD-10-CM | POA: Diagnosis not present

## 2016-05-30 DIAGNOSIS — R2689 Other abnormalities of gait and mobility: Secondary | ICD-10-CM

## 2016-05-30 DIAGNOSIS — M25561 Pain in right knee: Secondary | ICD-10-CM

## 2016-05-30 NOTE — Therapy (Signed)
[facility] [place], [place], [address] Phone: [phone]   Fax:  [phone]  Physical Therapy Treatment  Patient Details  Name: Ricardo Mayer. MRN: [id number] Date of Birth: 1941/06/16 Referring Provider: Paralee Cancel, MD  Encounter Date: 05/30/2016      PT End of Session - 05/30/16 1036    Visit Number 27   Number of Visits 34   Date for PT Re-Evaluation 06/16/16   PT Start Time 0946   PT Stop Time 1036   PT Time Calculation (min) 50 min   Activity Tolerance Patient tolerated treatment well   Behavior During Therapy [facility] for tasks assessed/performed      Past Medical History:  Diagnosis Date  . Adenomatous polyp of colon   . Arthritis    bilateral knees  . CKD (chronic kidney disease), stage II    Dr. Patel,nephrology "stable last 3 yrs"  . Diabetes mellitus without complication ([place])    Dr. Laurance Flatten.PCP  . History of basal cell cancer   . History of DVT of lower extremity    2011--  LEFT LEG  . History of kidney stones   . History of melanoma excision    RIGHT ARM  . Hyperlipidemia   . Hypertension   . Impaired hearing    hearing aids bilateral  . Melanoma ([place]) 1990   arm and back -Melanoma, basal cell others- Dr. Edgardo Roys follows.  . Near syncope 08/25/2015   Dr. Roland Rack once"found blood clots in lung"  . Nocturia   . Prostate cancer [facility]) 2014   Dr. Cherylann Parr- "seed implant" '14  . Ulcer of left lower leg ([place])    recent pcp visit dr Laurance Flatten--  ordered betadine wet/dry dsg daily (03-18-2013)  [place] IN 1 WEEK    Past Surgical History:  Procedure Laterality Date  . KIDNEY STONE EXTRACTIONS  LAST ONE 1970   X2 OPEN/  X1  URETEROSCOPY  . MELANOMA EXCISION RIGHT ARM  1990  . PROSTATE BIOPSY    . RADIOACTIVE SEED IMPLANT N/A 03/21/2013   Procedure: RADIOACTIVE SEED IMPLANT;  Surgeon: Malka So, MD;  [facility];  Service: Urology;   Laterality: N/A;  87 seeds implanted    . TONSILLECTOMY    . TOTAL KNEE ARTHROPLASTY Right 03/21/2016   Procedure: RIGHT TOTAL KNEE ARTHROPLASTY;  Surgeon: Paralee Cancel, MD;  Location: [facility];  Service: Orthopedics;  Laterality: Right;    There were no vitals filed for this visit.      Subjective Assessment - 05/30/16 1000    Subjective Reports that discomfort today may be from rainy, cold weather outside.   Pertinent History CKD, DM, hx basal cell cancer, HLD, HTN, DVT/PE   Limitations Standing;Walking   How long can you stand comfortably? 30 minutes   How long can you walk comfortably? 45 minutes   Patient Stated Goals improve motion and mobility, decrease pain   Currently in Pain? Yes   Pain Score 3    Pain Location Knee   Pain Orientation Right   Pain Descriptors / Indicators Aching   Pain Type Surgical pain   Pain Onset More than a month ago            [facility] PT Assessment - 05/30/16 0001      Assessment   Medical Diagnosis R TKA   Onset Date/Surgical Date 03/21/16   Next MD Visit 06/23/2016   Prior Therapy acute x 2 visits  Precautions   Precautions Fall     Restrictions   Weight Bearing Restrictions No     ROM / Strength   AROM / PROM / Strength AROM     AROM   Overall AROM  Deficits   AROM Assessment Site Knee   Right/Left Knee Right   Right Knee Extension -7  resting at 9-10 deg flexion in supine                     [facility] Adult PT Treatment/Exercise - 05/30/16 0001      Knee/Hip Exercises: Aerobic   Nustep L8, seat 8 x15 min     Knee/Hip Exercises: Machines for Strengthening   Cybex Knee Extension 20# 3x15 reps   Cybex Knee Flexion 40# 3x15 reps   Cybex Leg Press 2 pl, seat 7, 3x15 reps     Knee/Hip Exercises: Standing   Wall Squat 2 sets;10 reps     Knee/Hip Exercises: Seated   Sit to Sand 2 sets;10 reps;without UE support  lowest plinth height     Modalities   Modalities Cryotherapy     Cryotherapy   Number Minutes  Cryotherapy 10 Minutes   Cryotherapy Location Knee   Type of Cryotherapy Ice pack     Manual Therapy   Manual Therapy Soft tissue mobilization;Passive ROM   Soft tissue mobilization STW to R medial HS, adductors to decrease tightness; R patellar mobilizations in sup/inf, med/lat to promote proper mobility   Passive ROM PROM of R knee into ext with gentle holds at end range                  PT Short Term Goals - 04/11/16 1246      PT SHORT TERM GOAL #1   Title independent with HEP (04/14/16)   Time 3   Period Weeks   Status Achieved           PT Long Term Goals - 05/23/16 1018      PT LONG TERM GOAL #1   Title improve R knee AROM 0-95 degrees for improved function (05/05/16)   Time 6   Period Weeks   Status Partially Met  AROM -11-100 05/18/16     PT LONG TERM GOAL #2   Title ambulate > 200' without device independently for improved function (05/05/16)   Time 6   Period Weeks   Status Achieved     PT LONG TERM GOAL #3   Title report pain < 2/10 with ADLs for improved function (05/05/16)   Period Weeks   Status Achieved     PT LONG TERM GOAL #4   Title tolerate standing and walking > 10 min without increase in pain for improved function (05/05/16)   Time 6   Period Weeks   Status Achieved               Plan - 05/30/16 1031    Clinical Impression Statement Patient continues to tolerate treatment well with no complaints during today's treatment. Body weight knee extensors strengthening introduced today in efforts to improve R knee strength and improve extension. Firm end feels noted with PROM of R knee into extension with tightness still noted in R HS and adductors. Patellar moblity limited slightly with sup/inf directions. Normal cryotherapy response noted following removal of the modality.   Rehab Potential Good   PT Frequency 3x / week   PT Duration 6 weeks   PT Treatment/Interventions ADLs/Self Care Home Management;Cryotherapy;Functional mobility  training;Stair  training;Gait training;DME Instruction;Moist Heat;Therapeutic activities;Therapeutic exercise;Balance training;Neuromuscular re-education;Patient/family education;Passive range of motion;Scar mobilization;Manual techniques   PT Next Visit Plan cont with POC per TKR / NO VASOPNEUMATIC or ELECTRICAL STIMULATON per MPT.    Consulted and Agree with Plan of Care Patient      Patient will benefit from skilled therapeutic intervention in order to improve the following deficits and impairments:  Abnormal gait, Cardiopulmonary status limiting activity, Decreased range of motion, Difficulty walking, Pain, Decreased endurance, Decreased activity tolerance, Decreased skin integrity, Impaired flexibility, Decreased scar mobility, Decreased mobility, Decreased strength, Increased edema  Visit Diagnosis: Acute pain of right knee  Stiffness of right knee, not elsewhere classified  Other abnormalities of gait and mobility     Problem List Patient Active Problem List   Diagnosis Date Noted  . Morbid obesity due to excess calories ([place]) 05/27/2016  . Anemia of chronic disease   . LOC (loss of consciousness) ([place]) 04/08/2016  . TIA (transient ischemic attack) 04/08/2016  . 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 S/P knee replacement 03/21/2016  . Anemia associated with chronic renal failure 10/28/2015  . Hyperparathyroidism , secondary, non-renal ([place]) 10/28/2015  . Pulmonary emboli ([place]) 08/27/2015  . Near syncope 08/25/2015  . DOE (dyspnea on exertion) 06/02/2015  . Controlled type 2 diabetes mellitus ([place]) 06/02/2015  . Melanoma, history of 10/09/2013  . 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 Chronic kidney disease, stage III (moderate) 10/09/2013  . Venous (peripheral) insufficiency 10/09/2013  . Statin intolerance 10/09/2013  . Prostate cancer ([place]) 12/25/2012    Wynelle Fanny, PTA 05/30/2016, 10:39 AM  [facility] [address] [place], [place], [address] Phone: [phone]   Fax:  [phone]  Name: Ricardo Mayer. MRN: [id number] Date of Birth: Jul 18, 1941

## 2016-06-01 ENCOUNTER — Other Ambulatory Visit (INDEPENDENT_AMBULATORY_CARE_PROVIDER_SITE_OTHER): Payer: Medicare Other

## 2016-06-01 ENCOUNTER — Ambulatory Visit: Payer: Medicare Other | Admitting: Physical Therapy

## 2016-06-01 ENCOUNTER — Encounter: Payer: Self-pay | Admitting: Physical Therapy

## 2016-06-01 ENCOUNTER — Other Ambulatory Visit: Payer: Self-pay

## 2016-06-01 DIAGNOSIS — N2581 Secondary hyperparathyroidism of renal origin: Secondary | ICD-10-CM | POA: Diagnosis not present

## 2016-06-01 DIAGNOSIS — R2689 Other abnormalities of gait and mobility: Secondary | ICD-10-CM

## 2016-06-01 DIAGNOSIS — M25561 Pain in right knee: Secondary | ICD-10-CM | POA: Diagnosis not present

## 2016-06-01 DIAGNOSIS — M25661 Stiffness of right knee, not elsewhere classified: Secondary | ICD-10-CM | POA: Diagnosis not present

## 2016-06-01 DIAGNOSIS — N183 Chronic kidney disease, stage 3 (moderate): Secondary | ICD-10-CM | POA: Diagnosis not present

## 2016-06-01 MED ORDER — GLUCOSE BLOOD VI STRP: ORAL_STRIP | 0 refills | Status: DC

## 2016-06-01 NOTE — Therapy (Signed)
[facility] [place], [place], [address] Phone: [phone]   Fax:  [phone]  Physical Therapy Treatment  Patient Details  Name: Ricardo Mayer. MRN: [id number] Date of Birth: [birth year] Referring Provider: Paralee Cancel, MD  Encounter Date: 06/01/2016      PT End of Session - 06/01/16 0951    Visit Number 28   Number of Visits 34   Date for PT Re-Evaluation 06/16/16   PT Start Time 0944   PT Stop Time 1037   PT Time Calculation (min) 53 min   Activity Tolerance Patient tolerated treatment well   Behavior During Therapy [facility] for tasks assessed/performed      Past Medical History:  Diagnosis Date  . Adenomatous polyp of colon   . Arthritis    bilateral knees  . CKD (chronic kidney disease), stage II    Dr. Patel,nephrology "stable last 3 yrs"  . Diabetes mellitus without complication ([place])    Dr. Laurance Flatten.PCP  . History of basal cell cancer   . History of DVT of lower extremity    2011--  LEFT LEG  . History of kidney stones   . History of melanoma excision    RIGHT ARM  . Hyperlipidemia   . Hypertension   . Impaired hearing    hearing aids bilateral  . Melanoma ([place]) 1990   arm and back -Melanoma, basal cell others- Dr. Edgardo Roys follows.  . Near syncope 08/25/2015   Dr. Roland Rack once"found blood clots in lung"  . Nocturia   . Prostate cancer [facility]) 2014   Dr. Cherylann Parr- "seed implant" '14  . Ulcer of left lower leg ([place])    recent pcp visit dr Laurance Flatten--  ordered betadine wet/dry dsg daily (03-18-2013)  [place] IN 1 WEEK    Past Surgical History:  Procedure Laterality Date  . KIDNEY STONE EXTRACTIONS  LAST ONE 1970   X2 OPEN/  X1  URETEROSCOPY  . MELANOMA EXCISION RIGHT ARM  1990  . PROSTATE BIOPSY    . RADIOACTIVE SEED IMPLANT N/A 03/21/2013   Procedure: RADIOACTIVE SEED IMPLANT;  Surgeon: Malka So, MD;  [facility];  Service: Urology;   Laterality: N/A;  87 seeds implanted    . TONSILLECTOMY    . TOTAL KNEE ARTHROPLASTY Right 03/21/2016   Procedure: RIGHT TOTAL KNEE ARTHROPLASTY;  Surgeon: Paralee Cancel, MD;  Location: [facility];  Service: Orthopedics;  Laterality: Right;    There were no vitals filed for this visit.      Subjective Assessment - 06/01/16 0950    Subjective Reports stiffness this morning and discomfort around R lateral thigh and into shin area.   Pertinent History CKD, DM, hx basal cell cancer, HLD, HTN, DVT/PE   Limitations Standing;Walking   How long can you stand comfortably? 30 minutes   How long can you walk comfortably? 45 minutes   Patient Stated Goals improve motion and mobility, decrease pain   Currently in Pain? Yes   Pain Score 2    Pain Location Knee   Pain Orientation Right   Pain Descriptors / Indicators Discomfort;Other (Comment)  Stiffnes   Pain Type Surgical pain   Pain Onset More than a month ago            [facility] PT Assessment - 06/01/16 0001      Assessment   Medical Diagnosis R TKA   Onset Date/Surgical Date 03/21/16   Next MD Visit 06/23/2016   Prior  Therapy acute x 2 visits     Precautions   Precautions Fall     Restrictions   Weight Bearing Restrictions No     ROM / Strength   AROM / PROM / Strength AROM     AROM   Overall AROM  Deficits   AROM Assessment Site Knee   Right/Left Knee Right   Right Knee Extension -6                     [facility] Adult PT Treatment/Exercise - 06/01/16 0001      Knee/Hip Exercises: Aerobic   Nustep L8, seat 8 x17 min     Knee/Hip Exercises: Machines for Strengthening   Cybex Knee Extension 30# 4x10 reps   Cybex Knee Flexion 40# 4x10 reps   Cybex Leg Press 3 pl, seat 8, 4*10 reps     Knee/Hip Exercises: Standing   Hip Abduction Stengthening;Right;4 sets;10 reps;Knee straight   Abduction Limitations 4#   Step Down Right;3 sets;10 reps;Hand Hold: 2;Step Height: 6"     Modalities   Modalities Cryotherapy      Cryotherapy   Number Minutes Cryotherapy 10 Minutes   Cryotherapy Location Knee   Type of Cryotherapy Ice pack     Manual Therapy   Manual Therapy Passive ROM   Passive ROM PROM of R knee into ext with gentle holds at end range                  PT Short Term Goals - 04/11/16 1246      PT SHORT TERM GOAL #1   Title independent with HEP (04/14/16)   Time 3   Period Weeks   Status Achieved           PT Long Term Goals - 05/23/16 1018      PT LONG TERM GOAL #1   Title improve R knee AROM 0-95 degrees for improved function (05/05/16)   Time 6   Period Weeks   Status Partially Met  AROM -11-100 05/18/16     PT LONG TERM GOAL #2   Title ambulate > 200' without device independently for improved function (05/05/16)   Time 6   Period Weeks   Status Achieved     PT LONG TERM GOAL #3   Title report pain < 2/10 with ADLs for improved function (05/05/16)   Period Weeks   Status Achieved     PT LONG TERM GOAL #4   Title tolerate standing and walking > 10 min without increase in pain for improved function (05/05/16)   Time 6   Period Weeks   Status Achieved               Plan - 06/01/16 1043    Clinical Impression Statement Patient continues to do very well in clinic following R TKR. Patient experienced stiffness this morning and reported R laterodistal thigh and superior shin discomfort that improved with movement. Patient able to progress in resistance on machinary without complaint. Further quad and hip abduction strengthening continued without complaint. AROM R knee extension improved to 6 deg from neutral today with firm end feel noted with PROM into extension. Normal cryotherapy response noted following removal of the modalities.    Rehab Potential Good   PT Frequency 3x / week   PT Duration 6 weeks   PT Treatment/Interventions ADLs/Self Care Home Management;Cryotherapy;Functional mobility training;Stair training;Gait training;DME Instruction;Moist  Heat;Therapeutic activities;Therapeutic exercise;Balance training;Neuromuscular re-education;Patient/family education;Passive range of motion;Scar mobilization;Manual techniques  PT Next Visit Plan cont with POC per TKR / NO VASOPNEUMATIC or ELECTRICAL STIMULATON per MPT.    Consulted and Agree with Plan of Care Patient      Patient will benefit from skilled therapeutic intervention in order to improve the following deficits and impairments:  Abnormal gait, Cardiopulmonary status limiting activity, Decreased range of motion, Difficulty walking, Pain, Decreased endurance, Decreased activity tolerance, Decreased skin integrity, Impaired flexibility, Decreased scar mobility, Decreased mobility, Decreased strength, Increased edema  Visit Diagnosis: Acute pain of right knee  Stiffness of right knee, not elsewhere classified  Other abnormalities of gait and mobility     Problem List Patient Active Problem List   Diagnosis Date Noted  . Morbid obesity due to excess calories ([place]) 05/27/2016  . Anemia of chronic disease   . LOC (loss of consciousness) ([place]) 04/08/2016  . TIA (transient ischemic attack) 04/08/2016  . 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 S/P knee replacement 03/21/2016  . Anemia associated with chronic renal failure 10/28/2015  . Hyperparathyroidism , secondary, non-renal ([place]) 10/28/2015  . Pulmonary emboli ([place]) 08/27/2015  . Near syncope 08/25/2015  . DOE (dyspnea on exertion) 06/02/2015  . Controlled type 2 diabetes mellitus ([place]) 06/02/2015  . Melanoma, history of 10/09/2013  . 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 Chronic kidney disease, stage III (moderate) 10/09/2013  . Venous (peripheral) insufficiency 10/09/2013  . Statin intolerance 10/09/2013  . Prostate cancer ([place]) 12/25/2012    Wynelle Fanny, PTA 06/01/2016, 10:48 AM  [facility] [address] [place], [place],  [address] Phone: [phone]   Fax:  [phone]  Name: Ricardo Mayer. MRN: [id number] Date of Birth: 12/09/1940

## 2016-06-03 ENCOUNTER — Ambulatory Visit: Payer: Medicare Other | Admitting: Physical Therapy

## 2016-06-03 ENCOUNTER — Encounter: Payer: Self-pay | Admitting: Physical Therapy

## 2016-06-03 DIAGNOSIS — M25661 Stiffness of right knee, not elsewhere classified: Secondary | ICD-10-CM

## 2016-06-03 DIAGNOSIS — M25561 Pain in right knee: Secondary | ICD-10-CM

## 2016-06-03 DIAGNOSIS — R2689 Other abnormalities of gait and mobility: Secondary | ICD-10-CM | POA: Diagnosis not present

## 2016-06-03 NOTE — Therapy (Signed)
[facility] [place], [place], [address] Phone: [phone]   Fax:  [phone]  Physical Therapy Treatment  Patient Details  Name: Ricardo Mayer. MRN: [id number] Date of Birth: 03/16/1941 Referring Provider: Paralee Cancel, MD  Encounter Date: 06/03/2016      PT End of Session - 06/03/16 0939    Visit Number 29   Number of Visits 34   Date for PT Re-Evaluation 06/16/16   PT Start Time 0947   PT Stop Time 1038   PT Time Calculation (min) 51 min   Activity Tolerance Patient tolerated treatment well   Behavior During Therapy [facility] for tasks assessed/performed      Past Medical History:  Diagnosis Date  . Adenomatous polyp of colon   . Arthritis    bilateral knees  . CKD (chronic kidney disease), stage II    Dr. Patel,nephrology "stable last 3 yrs"  . Diabetes mellitus without complication ([place])    Dr. Laurance Flatten.PCP  . History of basal cell cancer   . History of DVT of lower extremity    2011--  LEFT LEG  . History of kidney stones   . History of melanoma excision    RIGHT ARM  . Hyperlipidemia   . Hypertension   . Impaired hearing    hearing aids bilateral  . Melanoma ([place]) 1990   arm and back -Melanoma, basal cell others- Dr. Edgardo Roys follows.  . Near syncope 08/25/2015   Dr. Roland Rack once"found blood clots in lung"  . Nocturia   . Prostate cancer [facility]) 2014   Dr. Cherylann Parr- "seed implant" '14  . Ulcer of left lower leg ([place])    recent pcp visit dr Laurance Flatten--  ordered betadine wet/dry dsg daily (03-18-2013)  [place] IN 1 WEEK    Past Surgical History:  Procedure Laterality Date  . KIDNEY STONE EXTRACTIONS  LAST ONE 1970   X2 OPEN/  X1  URETEROSCOPY  . MELANOMA EXCISION RIGHT ARM  1990  . PROSTATE BIOPSY    . RADIOACTIVE SEED IMPLANT N/A 03/21/2013   Procedure: RADIOACTIVE SEED IMPLANT;  Surgeon: Malka So, MD;  Location: [facility];  Service: Urology;   Laterality: N/A;  87 seeds implanted    . TONSILLECTOMY    . TOTAL KNEE ARTHROPLASTY Right 03/21/2016   Procedure: RIGHT TOTAL KNEE ARTHROPLASTY;  Surgeon: Paralee Cancel, MD;  [facility];  Service: Orthopedics;  Laterality: Right;    There were no vitals filed for this visit.      Subjective Assessment - 06/03/16 0939    Subjective Reports that he stood a lot yesterday so his knees bothered him but he iced them well last night per patient report.   Pertinent History CKD, DM, hx basal cell cancer, HLD, HTN, DVT/PE   Limitations Standing;Walking   How long can you stand comfortably? 30 minutes   How long can you walk comfortably? 45 minutes   Patient Stated Goals improve motion and mobility, decrease pain   Currently in Pain? No/denies            [facility] PT Assessment - 06/03/16 0001      Assessment   Medical Diagnosis R TKA   Onset Date/Surgical Date 03/21/16   Next MD Visit 06/23/2016   Prior Therapy acute x 2 visits     Precautions   Precautions Fall     Restrictions   Weight Bearing Restrictions No     ROM / Strength  AROM / PROM / Strength AROM     AROM   Overall AROM  Deficits;Within functional limits for tasks performed   AROM Assessment Site Knee   Right/Left Knee Right   Right Knee Extension -8   Right Knee Flexion 115                     [facility] Adult PT Treatment/Exercise - 06/03/16 0001      Knee/Hip Exercises: Aerobic   Nustep L7, seat 7-8, x16 min     Knee/Hip Exercises: Machines for Strengthening   Cybex Knee Extension 30# 3x10 reps   Cybex Knee Flexion 30# 5x10 reps   Cybex Leg Press 3 pl, seat 8, 4*10 reps     Knee/Hip Exercises: Standing   Terminal Knee Extension Limitations Green theraband RLE 2x10 reps   Step Down Right;2 sets;10 reps;Hand Hold: 2;Step Height: 8"     Modalities   Modalities Cryotherapy     Cryotherapy   Number Minutes Cryotherapy 10 Minutes   Cryotherapy Location Knee   Type of Cryotherapy Ice pack      Manual Therapy   Manual Therapy Passive ROM   Passive ROM PROM of R knee into ext with gentle holds at end range                  PT Short Term Goals - 04/11/16 1246      PT SHORT TERM GOAL #1   Title independent with HEP (04/14/16)   Time 3   Period Weeks   Status Achieved           PT Long Term Goals - 06/03/16 1029      PT LONG TERM GOAL #1   Title improve R knee AROM 0-95 degrees for improved function (05/05/16)   Time 6   Period Weeks   Status Partially Met  AROM R knee 8-115 deg 06/03/16     PT LONG TERM GOAL #2   Title ambulate > 200' without device independently for improved function (05/05/16)   Time 6   Period Weeks   Status Achieved     PT LONG TERM GOAL #3   Title report pain < 2/10 with ADLs for improved function (05/05/16)   Period Weeks   Status Achieved     PT LONG TERM GOAL #4   Title tolerate standing and walking > 10 min without increase in pain for improved function (05/05/16)   Time 6   Period Weeks   Status Achieved               Plan - 06/03/16 1035    Clinical Impression Statement Patient continues to tolerate treatment well with no reports of increased R knee pain. Able to tolerate increased repititions with no complaints. Core focus of today's treatment was strengthening R Quad to further improve R knee extension. AROM R knee measured as 8-115 deg in supine. Normal cryotherapy response noted following removal of the ice pack.   Rehab Potential Good   PT Frequency 3x / week   PT Duration 6 weeks   PT Treatment/Interventions ADLs/Self Care Home Management;Cryotherapy;Functional mobility training;Stair training;Gait training;DME Instruction;Moist Heat;Therapeutic activities;Therapeutic exercise;Balance training;Neuromuscular re-education;Patient/family education;Passive range of motion;Scar mobilization;Manual techniques   PT Next Visit Plan D/C and FOTO to be completed next treatment.   Consulted and Agree with Plan of Care  Patient      Patient will benefit from skilled therapeutic intervention in order to improve the following deficits and impairments:  Abnormal gait, Cardiopulmonary status limiting activity, Decreased range of motion, Difficulty walking, Pain, Decreased endurance, Decreased activity tolerance, Decreased skin integrity, Impaired flexibility, Decreased scar mobility, Decreased mobility, Decreased strength, Increased edema  Visit Diagnosis: Acute pain of right knee  Stiffness of right knee, not elsewhere classified  Other abnormalities of gait and mobility     Problem List Patient Active Problem List   Diagnosis Date Noted  . Morbid obesity due to excess calories ([place]) 05/27/2016  . Anemia of chronic disease   . LOC (loss of consciousness) ([place]) 04/08/2016  . TIA (transient ischemic attack) 04/08/2016  . 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 S/P knee replacement 03/21/2016  . Anemia associated with chronic renal failure 10/28/2015  . Hyperparathyroidism , secondary, non-renal ([place]) 10/28/2015  . Pulmonary emboli ([place]) 08/27/2015  . Near syncope 08/25/2015  . DOE (dyspnea on exertion) 06/02/2015  . Controlled type 2 diabetes mellitus ([place]) 06/02/2015  . Melanoma, history of 10/09/2013  . 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 Chronic kidney disease, stage III (moderate) 10/09/2013  . Venous (peripheral) insufficiency 10/09/2013  . Statin intolerance 10/09/2013  . Prostate cancer ([place]) 12/25/2012    Wynelle Fanny, PTA 06/03/2016, 11:06 AM  [facility] [address] [place], [place], [address] Phone: [phone]   Fax:  [phone]  Name: Ricardo Mayer. MRN: [id number] Date of Birth: [birth year]

## 2016-06-06 ENCOUNTER — Encounter: Payer: Medicare Other | Admitting: Physical Therapy

## 2016-06-06 DIAGNOSIS — D631 Anemia in chronic kidney disease: Secondary | ICD-10-CM | POA: Diagnosis not present

## 2016-06-06 DIAGNOSIS — N2581 Secondary hyperparathyroidism of renal origin: Secondary | ICD-10-CM | POA: Diagnosis not present

## 2016-06-06 DIAGNOSIS — Z6837 Body mass index (BMI) 37.0-37.9, adult: Secondary | ICD-10-CM | POA: Diagnosis not present

## 2016-06-06 DIAGNOSIS — N183 Chronic kidney disease, stage 3 (moderate): Secondary | ICD-10-CM | POA: Diagnosis not present

## 2016-06-06 DIAGNOSIS — I129 Hypertensive chronic kidney disease with stage 1 through stage 4 chronic kidney disease, or unspecified chronic kidney disease: Secondary | ICD-10-CM | POA: Diagnosis not present

## 2016-06-07 DIAGNOSIS — D239 Other benign neoplasm of skin, unspecified: Secondary | ICD-10-CM | POA: Diagnosis not present

## 2016-06-07 DIAGNOSIS — L570 Actinic keratosis: Secondary | ICD-10-CM | POA: Diagnosis not present

## 2016-06-08 ENCOUNTER — Encounter: Payer: Self-pay | Admitting: Physical Therapy

## 2016-06-08 ENCOUNTER — Ambulatory Visit: Payer: Medicare Other | Admitting: Physical Therapy

## 2016-06-08 DIAGNOSIS — M25561 Pain in right knee: Secondary | ICD-10-CM | POA: Diagnosis not present

## 2016-06-08 DIAGNOSIS — R2689 Other abnormalities of gait and mobility: Secondary | ICD-10-CM | POA: Diagnosis not present

## 2016-06-08 DIAGNOSIS — M25661 Stiffness of right knee, not elsewhere classified: Secondary | ICD-10-CM

## 2016-06-08 NOTE — Therapy (Signed)
[facility] [place], [place], [address] Phone: [phone]   Fax:  [phone]  Physical Therapy Treatment  Patient Details  Name: Biruk Troia. MRN: [id number] Date of Birth: 1941/01/18 Referring Provider: Paralee Cancel, MD  Encounter Date: 06/08/2016      PT End of Session - 06/08/16 0931    Visit Number 30   Number of Visits 34   Date for PT Re-Evaluation 06/16/16   PT Start Time 0946   PT Stop Time 1036   PT Time Calculation (min) 50 min   Activity Tolerance Patient tolerated treatment well   Behavior During Therapy [facility] for tasks assessed/performed      Past Medical History:  Diagnosis Date  . Adenomatous polyp of colon   . Arthritis    bilateral knees  . CKD (chronic kidney disease), stage II    Dr. Patel,nephrology "stable last 3 yrs"  . Diabetes mellitus without complication ([place])    Dr. Laurance Flatten.PCP  . History of basal cell cancer   . History of DVT of lower extremity    2011--  LEFT LEG  . History of kidney stones   . History of melanoma excision    RIGHT ARM  . Hyperlipidemia   . Hypertension   . Impaired hearing    hearing aids bilateral  . Melanoma ([place]) 1990   arm and back -Melanoma, basal cell others- Dr. Edgardo Roys follows.  . Near syncope 08/25/2015   Dr. Roland Rack once"found blood clots in lung"  . Nocturia   . Prostate cancer [facility]) 2014   Dr. Cherylann Parr- "seed implant" '14  . Ulcer of left lower leg ([place])    recent pcp visit dr Laurance Flatten--  ordered betadine wet/dry dsg daily (03-18-2013)  [place] IN 1 WEEK    Past Surgical History:  Procedure Laterality Date  . KIDNEY STONE EXTRACTIONS  LAST ONE 1970   X2 OPEN/  X1  URETEROSCOPY  . MELANOMA EXCISION RIGHT ARM  1990  . PROSTATE BIOPSY    . RADIOACTIVE SEED IMPLANT N/A 03/21/2013   Procedure: RADIOACTIVE SEED IMPLANT;  Surgeon: Malka So, MD;  Location[facility];  Service: Urology;   Laterality: N/A;  87 seeds implanted    . TONSILLECTOMY    . TOTAL KNEE ARTHROPLASTY Right 03/21/2016   Procedure: RIGHT TOTAL KNEE ARTHROPLASTY;  Surgeon: Paralee Cancel, MD;  [facility];  Service: Orthopedics;  Laterality: Right;    There were no vitals filed for this visit.      Subjective Assessment - 06/08/16 0931    Subjective Reports that both he and his wife had MD in [place] so was riding for two days and correlates that to increased R knee swelling.   Pertinent History CKD, DM, hx basal cell cancer, HLD, HTN, DVT/PE   Limitations Standing;Walking   How long can you stand comfortably? 30 minutes   How long can you walk comfortably? 45 minutes   Patient Stated Goals improve motion and mobility, decrease pain   Currently in Pain? No/denies                                   PT Short Term Goals - 04/11/16 1246      PT SHORT TERM GOAL #1   Title independent with HEP (04/14/16)   Time 3   Period Weeks   Status Achieved  PT Long Term Goals - 06/03/16 1029      PT LONG TERM GOAL #1   Title improve R knee AROM 0-95 degrees for improved function (05/05/16)   Time 6   Period Weeks   Status Partially Met  AROM R knee 8-115 deg 06/03/16     PT LONG TERM GOAL #2   Title ambulate > 200' without device independently for improved function (05/05/16)   Time 6   Period Weeks   Status Achieved     PT LONG TERM GOAL #3   Title report pain < 2/10 with ADLs for improved function (05/05/16)   Period Weeks   Status Achieved     PT LONG TERM GOAL #4   Title tolerate standing and walking > 10 min without increase in pain for improved function (05/05/16)   Time 6   Period Weeks   Status Achieved               Plan - 06/08/16 0936    Clinical Impression Statement Patient has progressed well although slowly in regards to R knee ROM following a R TKR. Patient's AROM R knee extension measured -5 deg from neutral today that has  improved slowly throughout treatment. Patient has tolerated LE strengthening well with machine strengthening. Patient has been able to achieve most goals except for R knee ROM goals. Normal cryotherapy response noted following removal of the ice pack. Patient showed interest in facility's self directed gym program.   Rehab Potential Good   PT Frequency 3x / week   PT Duration 6 weeks   PT Treatment/Interventions ADLs/Self Care Home Management;Cryotherapy;Functional mobility training;Stair training;Gait training;DME Instruction;Moist Heat;Therapeutic activities;Therapeutic exercise;Balance training;Neuromuscular re-education;Patient/family education;Passive range of motion;Scar mobilization;Manual techniques   PT Next Visit Plan D/C summary and G-Code required.   Consulted and Agree with Plan of Care Patient      Patient will benefit from skilled therapeutic intervention in order to improve the following deficits and impairments:  Abnormal gait, Cardiopulmonary status limiting activity, Decreased range of motion, Difficulty walking, Pain, Decreased endurance, Decreased activity tolerance, Decreased skin integrity, Impaired flexibility, Decreased scar mobility, Decreased mobility, Decreased strength, Increased edema  Visit Diagnosis: Acute pain of right knee  Stiffness of right knee, not elsewhere classified  Other abnormalities of gait and mobility       G-Codes - 07-08-16 1710    Functional Assessment Tool Used FOTO....30th visit...34% limitation.   Functional Limitation Mobility: Walking and moving around   Mobility: Walking and Moving Around Current Status [phone]) At least 20 percent but less than 40 percent impaired, limited or restricted   Mobility: Walking and Moving Around Goal Status [phone]) At least 1 percent but less than 20 percent impaired, limited or restricted   Carrying, Moving and Handling Objects Discharge Status [phone]) At least 20 percent but less than 40 percent  impaired, limited or restricted      Problem List Patient Active Problem List   Diagnosis Date Noted  . Morbid obesity due to excess calories ([place]) 05/27/2016  . Anemia of chronic disease   . LOC (loss of consciousness) ([place]) 04/08/2016  . TIA (transient ischemic attack) 04/08/2016  . 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 S/P knee replacement 03/21/2016  . Anemia associated with chronic renal failure 10/28/2015  . Hyperparathyroidism , secondary, non-renal ([place]) 10/28/2015  . Pulmonary emboli ([place]) 08/27/2015  . Near syncope 08/25/2015  . DOE (dyspnea on exertion) 06/02/2015  . Controlled type 2 diabetes mellitus ([place]) 06/02/2015  . Melanoma, history  of 10/09/2013  . 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 Chronic kidney disease, stage III (moderate) 10/09/2013  . Venous (peripheral) insufficiency 10/09/2013  . Statin intolerance 10/09/2013  . Prostate cancer ([place]) 12/25/2012    Ahmed Prima, PTA 06/09/16 5:15 PM  [facility] [address] [place], [place], [address] Phone: [phone]   Fax:  [phone]  Name: Travers Goodley. MRN: [id number] Date of Birth: 1941/03/22 PHYSICAL THERAPY DISCHARGE SUMMARY  Visits from Start of Care: 4.  Current functional level related to goals / functional outcomes: Please see above.   Remaining deficits: Patient still lacking some right knee range of motion.   Education / Equipment: HEP. Plan: Patient agrees to discharge.  Patient goals were partially met. Patient is being discharged due to being pleased with the current functional level.  ?????         Mali Applegate MPT

## 2016-06-23 DIAGNOSIS — Z471 Aftercare following joint replacement surgery: Secondary | ICD-10-CM | POA: Diagnosis not present

## 2016-06-23 DIAGNOSIS — Z96651 Presence of right artificial knee joint: Secondary | ICD-10-CM | POA: Diagnosis not present

## 2016-07-13 ENCOUNTER — Encounter: Payer: Self-pay | Admitting: Family Medicine

## 2016-07-13 ENCOUNTER — Ambulatory Visit (INDEPENDENT_AMBULATORY_CARE_PROVIDER_SITE_OTHER): Payer: Medicare Other | Admitting: Family Medicine

## 2016-07-13 VITALS — BP 138/68 | HR 70 | Temp 96.9°F | Ht 67.0 in | Wt 236.0 lb

## 2016-07-13 DIAGNOSIS — E7800 Pure hypercholesterolemia, unspecified: Secondary | ICD-10-CM | POA: Diagnosis not present

## 2016-07-13 DIAGNOSIS — R251 Tremor, unspecified: Secondary | ICD-10-CM

## 2016-07-13 DIAGNOSIS — E119 Type 2 diabetes mellitus without complications: Secondary | ICD-10-CM | POA: Diagnosis not present

## 2016-07-13 DIAGNOSIS — E1159 Type 2 diabetes mellitus with other circulatory complications: Secondary | ICD-10-CM | POA: Insufficient documentation

## 2016-07-13 DIAGNOSIS — E559 Vitamin D deficiency, unspecified: Secondary | ICD-10-CM

## 2016-07-13 DIAGNOSIS — E1169 Type 2 diabetes mellitus with other specified complication: Secondary | ICD-10-CM | POA: Insufficient documentation

## 2016-07-13 DIAGNOSIS — I10 Essential (primary) hypertension: Secondary | ICD-10-CM

## 2016-07-13 DIAGNOSIS — N183 Chronic kidney disease, stage 3 unspecified: Secondary | ICD-10-CM

## 2016-07-13 DIAGNOSIS — C61 Malignant neoplasm of prostate: Secondary | ICD-10-CM | POA: Diagnosis not present

## 2016-07-13 DIAGNOSIS — E785 Hyperlipidemia, unspecified: Secondary | ICD-10-CM | POA: Insufficient documentation

## 2016-07-13 DIAGNOSIS — Z789 Other specified health status: Secondary | ICD-10-CM | POA: Diagnosis not present

## 2016-07-13 LAB — MICROSCOPIC EXAMINATION
Bacteria, UA: NONE SEEN
Epithelial Cells (non renal): NONE SEEN /hpf (ref 0–10)
RBC, UA: NONE SEEN /hpf (ref 0–?)
RENAL EPITHEL UA: NONE SEEN /HPF
WBC, UA: NONE SEEN /hpf (ref 0–?)

## 2016-07-13 LAB — URINALYSIS, COMPLETE
BILIRUBIN UA: NEGATIVE
GLUCOSE, UA: NEGATIVE
KETONES UA: NEGATIVE
LEUKOCYTES UA: NEGATIVE
Nitrite, UA: NEGATIVE
SPEC GRAV UA: 1.02 (ref 1.005–1.030)
Urobilinogen, Ur: 0.2 mg/dL (ref 0.2–1.0)
pH, UA: 5 (ref 5.0–7.5)

## 2016-07-13 LAB — BAYER DCA HB A1C WAIVED: HB A1C: 5.8 % (ref <7.0)

## 2016-07-13 MED ORDER — SITAGLIPTIN PHOSPHATE 50 MG PO TABS: ORAL_TABLET | ORAL | 3 refills | Status: DC

## 2016-07-13 MED ORDER — MYRBETRIQ 50 MG PO TB24: 50.0000 mg | ORAL_TABLET | Freq: Every day | ORAL | 3 refills | Status: DC

## 2016-07-13 NOTE — Progress Notes (Signed)
Subjective:    Patient ID: Ricardo Huguenin., male    DOB: 11-19-1940, 75 y.o.   MRN: [id number]  HPI Pt here for follow up and management of chronic medical problems which includes diabetes, hyperlipidemia, and hypertension. He is taking medications regularly.This patient has a history of multiple problems. He has morbid obesity hyperparathyroidism pulmonary emboli, prostate cancer statin intolerance melanoma and chronic kidney disease. He has no specific complaints today and is requesting refills on Januvia and Myrbetriq. He will get lab work today. He is also due for an FOBT. Patient is active and still working part-time. He denies any chest pain pressure tightness or palpitations. He denies any shortness of breath area he denies any problems with swallowing heartburn indigestion nausea vomiting diarrhea or blood in the stool or black tarry bowel movements. He is passing his water without problems. He sees the dermatologist regularly every 3-6 months because of the melanoma. He sees the cardiologist only as needed. He sees the nephrologist about every 6 months. He sees Dr. Jeffie Pollock about every 6 months. He says that his blood sugars at home have been running fasting between 118 and 150. His weight today is up 10 pounds from the previous weight. The patient does complain of a tremor in his right hand especially when it is cold and it only lasts for a few minutes.     Patient Active Problem List   Diagnosis Date Noted  . Morbid obesity due to excess calories ([place]) 05/27/2016  . Anemia of chronic disease   . LOC (loss of consciousness) ([place]) 04/08/2016  . TIA (transient ischemic attack) 04/08/2016  . 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 S/P knee replacement 03/21/2016  . Anemia associated with chronic renal failure 10/28/2015  . Hyperparathyroidism , secondary, non-renal ([place]) 10/28/2015  . Pulmonary emboli ([place]) 08/27/2015  . Near syncope 08/25/2015  . DOE (dyspnea on exertion) 06/02/2015  . Controlled  type 2 diabetes mellitus ([place]) 06/02/2015  . Melanoma, history of 10/09/2013  . 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 Chronic kidney disease, stage III (moderate) 10/09/2013  . Venous (peripheral) insufficiency 10/09/2013  . Statin intolerance 10/09/2013  . Prostate cancer ([place]) 12/25/2012   Outpatient Encounter Prescriptions as of 07/13/2016  Medication Sig  . amLODipine-valsartan (EXFORGE) 10-320 MG tablet TAKE 1 TABLET BY MOUTH DAILY.  . fenofibrate 160 MG tablet TAKE 1 TABLET BY MOUTH DAILY (Patient taking differently: TAKE 1 TABLET (160 mg) BY MOUTH DAILY)  . furosemide (LASIX) 20 MG tablet Take 20 mg by mouth daily as needed.  Marland Kitchen glucose blood (ONE TOUCH ULTRA TEST) test strip USE FOR TESTING SUGAR 2 to 3 TIMES DAILY E11.9  . MYRBETRIQ 50 MG TB24 tablet Take 50 mg by mouth daily with breakfast.  . rivaroxaban (XARELTO) 20 MG TABS tablet Take one tablet by mouth daily with food (Patient taking differently: Take 20 mg by mouth daily with breakfast. )  . sitaGLIPtin (JANUVIA) 50 MG tablet TAKE 1 TABLET (50 MG TOTAL) BY MOUTH DAILY.  . vitamin B-12 (CYANOCOBALAMIN) 1000 MCG tablet Take 1,000 mcg by mouth daily.  . [DISCONTINUED] traMADol (ULTRAM) 50 MG tablet Take 50-100 mg by mouth every 8 (eight) hours as needed for moderate pain.   No facility-administered encounter medications on file as of 07/13/2016.      Review of Systems  Constitutional: Negative.   HENT: Negative.   Eyes: Negative.   Respiratory: Negative.   Cardiovascular: Negative.   Gastrointestinal: Negative.  Endocrine: Negative.   Genitourinary: Negative.   Musculoskeletal: Negative.   Skin: Negative.   Allergic/Immunologic: Negative.   Neurological: Negative.   Hematological: Negative.   Psychiatric/Behavioral: Negative.        Objective:   Physical Exam  Constitutional: He is oriented to person, place, and time. He appears well-developed and well-nourished. No  distress.  Pleasant and alert.  HENT:  Head: Normocephalic and atraumatic.  Right Ear: External ear normal.  Left Ear: External ear normal.  Nose: Nose normal.  Mouth/Throat: Oropharynx is clear and moist. No oropharyngeal exudate.  Eyes: Conjunctivae and EOM are normal. Pupils are equal, round, and reactive to light. Right eye exhibits no discharge. Left eye exhibits no discharge. No scleral icterus.  Neck: Normal range of motion. Neck supple. No thyromegaly present.  No bruits or thyromegaly  Cardiovascular: Normal rate, regular rhythm, normal heart sounds and intact distal pulses.   No murmur heard. Heart has a regular rate and rhythm at 60/m  Pulmonary/Chest: Effort normal and breath sounds normal. No respiratory distress. He has no wheezes. He has no rales. He exhibits no tenderness.  Abdominal: Soft. Bowel sounds are normal. He exhibits no mass. There is no tenderness. There is no rebound and no guarding.  Abdominal obesity without organ enlargement masses or bruits  Genitourinary:  Genitourinary Comments: Patient has a follow-up visit with his urologist soon.  Musculoskeletal: Normal range of motion. He exhibits edema.  Slight lower extremity edema.  Lymphadenopathy:    He has no cervical adenopathy.  Neurological: He is alert and oriented to person, place, and time. He has normal reflexes. No cranial nerve deficit.  No tremor noted today in the right hand. Once again the patient says this happens more when he is cold. He is right-handed.  Skin: Skin is warm and dry. No rash noted.  Psychiatric: He has a normal mood and affect. His behavior is normal. Judgment and thought content normal.  Nursing note and vitals reviewed.  BP 138/68 (BP Location: Left Arm)   Pulse 70   Temp (!) 96.9 F (36.1 C) (Oral)   Ht '5\' 7"'  (1.702 m)   Wt 236 lb (107 kg)   BMI 36.96 kg/m         Assessment & Plan:  1. Chronic kidney disease, stage III (moderate) -Continue to follow-up with Dr.  Posey Pronto, the nephrologist - BMP8+EGFR - CBC with Differential/Platelet  2. Prostate cancer ([place]) -Continue to follow-up with the urologist - CBC with Differential/Platelet - Urinalysis, Complete - PSA, total and free  3. Controlled type 2 diabetes mellitus without complication, without long-term current use of insulin ([place]) -Continue aggressive therapeutic lifestyle changes with weight loss and exercise - CBC with Differential/Platelet - Bayer DCA Hb A1c Waived  4. Pure hypercholesterolemia -Continue current treatment pending results of lab work as well as aggressive therapeutic lifestyle changes - CBC with Differential/Platelet - NMR, lipoprofile  5. Essential hypertension -The blood pressure is good today and he will continue with current treatment - BMP8+EGFR - CBC with Differential/Platelet - Hepatic function panel  6. Vitamin D deficiency -Continue with current treatment pending results of lab work - CBC with Differential/Platelet - VITAMIN D 25 Hydroxy (Vit-D Deficiency, Fractures)  7. Statin intolerance -Continue with aggressive dietary measures and exercise  8. Type 2 diabetes mellitus with vascular disease ([place]) -Continue current treatment pending results of lab work  9. Morbid obesity ([place]) -Continue with aggressive weight loss measures  10. Tremor of right hand -We will continue to monitor  this tremor, if the problem persists or becomes more frequent we will arrange a visit with the neurologist.  Meds ordered this encounter  Medications  . MYRBETRIQ 50 MG TB24 tablet    Sig: Take 1 tablet (50 mg total) by mouth daily with breakfast.    Dispense:  90 tablet    Refill:  3  . sitaGLIPtin (JANUVIA) 50 MG tablet    Sig: TAKE 1 TABLET (50 MG TOTAL) BY MOUTH DAILY.    Dispense:  90 tablet    Refill:  3   Patient Instructions                       Medicare Annual Wellness Visit  [facility] and the medical providers at [facility]  strive to bring you the best medical care.  In doing so we not only want to address your current medical conditions and concerns but also to detect new conditions early and prevent illness, disease and health-related problems.    Medicare offers a yearly Wellness Visit which allows our clinical staff to assess your need for preventative services including immunizations, lifestyle education, counseling to decrease risk of preventable diseases and screening for fall risk and other medical concerns.    This visit is provided free of charge (no copay) for all Medicare recipients. The clinical pharmacists at [facility] have begun to conduct these Wellness Visits which will also include a thorough review of all your medications.    As you primary medical provider recommend that you make an appointment for your Annual Wellness Visit if you have not done so already this year.  You may set up this appointment before you leave today or you may call back ([phone]) and schedule an appointment.  Please make sure when you call that you mention that you are scheduling your Annual Wellness Visit with the clinical pharmacist so that the appointment may be made for the proper length of time.     Continue current medications. Continue good therapeutic lifestyle changes which include good diet and exercise. Fall precautions discussed with patient. If an FOBT was given today- please return it to our front desk. If you are over 3 years old - you may need Prevnar 59 or the adult Pneumonia vaccine.  **Flu shots are available--- please call and schedule a FLU-CLINIC appointment**  After your visit with [name] today you will receive a survey in the mail or online from [employer] regarding your care with [name]. Please take a moment to fill this out. Your feedback is very important to [name] as you can help [name] better understand your patient needs as well as improve your experience and satisfaction. WE CARE  ABOUT YOU!!!     Continue to follow-up with nephrology, urology, and dermatology.    Arrie Senate MD

## 2016-07-13 NOTE — Patient Instructions (Addendum)
Medicare Annual Wellness Visit  [facility] and the medical providers at [facility] strive to bring you the best medical care.  In doing so we not only want to address your current medical conditions and concerns but also to detect new conditions early and prevent illness, disease and health-related problems.    Medicare offers a yearly Wellness Visit which allows our clinical staff to assess your need for preventative services including immunizations, lifestyle education, counseling to decrease risk of preventable diseases and screening for fall risk and other medical concerns.    This visit is provided free of charge (no copay) for all Medicare recipients. The clinical pharmacists at [facility] have begun to conduct these Wellness Visits which will also include a thorough review of all your medications.    As you primary medical provider recommend that you make an appointment for your Annual Wellness Visit if you have not done so already this year.  You may set up this appointment before you leave today or you may call back ([phone]) and schedule an appointment.  Please make sure when you call that you mention that you are scheduling your Annual Wellness Visit with the clinical pharmacist so that the appointment may be made for the proper length of time.     Continue current medications. Continue good therapeutic lifestyle changes which include good diet and exercise. Fall precautions discussed with patient. If an FOBT was given today- please return it to our front desk. If you are over 71 years old - you may need Prevnar 5 or the adult Pneumonia vaccine.  **Flu shots are available--- please call and schedule a FLU-CLINIC appointment**  After your visit with [name] today you will receive a survey in the mail or online from [employer] regarding your care with [name]. Please take a moment to fill this out. Your feedback is very  important to [name] as you can help [name] better understand your patient needs as well as improve your experience and satisfaction. WE CARE ABOUT YOU!!!   Continue to follow-up with urology, dermatology, and nephrology Continue with aggressive weight loss measures with diet and exercise

## 2016-07-14 LAB — NMR, LIPOPROFILE
CHOLESTEROL: 116 mg/dL (ref 100–199)
HDL CHOLESTEROL BY NMR: 41 mg/dL (ref >39)
HDL PARTICLE NUMBER: 26.4 umol/L — AB (ref >=30.5)
LDL-C: 56 mg/dL (ref 0–99)
LP-IR Score: 64 — ABNORMAL HIGH (ref <=45)
Small LDL Particle Number: <90 nmol/L (ref <=527)
TRIGLYCERIDES BY NMR: 97 mg/dL (ref 0–149)

## 2016-07-14 LAB — CBC WITH DIFFERENTIAL/PLATELET
BASOS: 0 %
Basophils Absolute: 0 10*3/uL (ref 0.0–0.2)
EOS (ABSOLUTE): 0.1 10*3/uL (ref 0.0–0.4)
EOS: 1 %
HEMATOCRIT: 41.3 % (ref 37.5–51.0)
Hemoglobin: 14.1 g/dL (ref 12.6–17.7)
IMMATURE GRANS (ABS): 0 10*3/uL (ref 0.0–0.1)
Immature Granulocytes: 0 %
LYMPHS: 26 %
Lymphocytes Absolute: 2.2 10*3/uL (ref 0.7–3.1)
MCH: 28.1 pg (ref 26.6–33.0)
MCHC: 34.1 g/dL (ref 31.5–35.7)
MCV: 82 fL (ref 79–97)
Monocytes Absolute: 0.6 10*3/uL (ref 0.1–0.9)
Monocytes: 7 %
NEUTROS ABS: 5.5 10*3/uL (ref 1.4–7.0)
Neutrophils: 66 %
PLATELETS: 191 10*3/uL (ref 150–379)
RBC: 5.01 x10E6/uL (ref 4.14–5.80)
RDW: 16.3 % — ABNORMAL HIGH (ref 12.3–15.4)
WBC: 8.5 10*3/uL (ref 3.4–10.8)

## 2016-07-14 LAB — HEPATIC FUNCTION PANEL
ALBUMIN: 4.2 g/dL (ref 3.5–4.8)
ALT: 11 IU/L (ref 0–44)
AST: 14 IU/L (ref 0–40)
Alkaline Phosphatase: 83 IU/L (ref 39–117)
BILIRUBIN TOTAL: 0.4 mg/dL (ref 0.0–1.2)
Bilirubin, Direct: 0.12 mg/dL (ref 0.00–0.40)
TOTAL PROTEIN: 6.5 g/dL (ref 6.0–8.5)

## 2016-07-14 LAB — BMP8+EGFR
BUN/Creatinine Ratio: 14 (ref 10–24)
BUN: 20 mg/dL (ref 8–27)
CALCIUM: 9.4 mg/dL (ref 8.6–10.2)
CO2: 25 mmol/L (ref 18–29)
Chloride: 103 mmol/L (ref 96–106)
Creatinine, Ser: 1.47 mg/dL — ABNORMAL HIGH (ref 0.76–1.27)
GFR, EST AFRICAN AMERICAN: 53 mL/min/{1.73_m2} — AB (ref >59)
GFR, EST NON AFRICAN AMERICAN: 46 mL/min/{1.73_m2} — AB (ref >59)
Glucose: 108 mg/dL — ABNORMAL HIGH (ref 65–99)
POTASSIUM: 5.2 mmol/L (ref 3.5–5.2)
SODIUM: 142 mmol/L (ref 134–144)

## 2016-07-14 LAB — PSA, TOTAL AND FREE
PSA FREE PCT: 10 %
PSA, Free: 0.02 ng/mL
Prostate Specific Ag, Serum: 0.2 ng/mL (ref 0.0–4.0)

## 2016-07-14 LAB — VITAMIN D 25 HYDROXY (VIT D DEFICIENCY, FRACTURES): Vit D, 25-Hydroxy: 33.8 ng/mL (ref 30.0–100.0)

## 2016-07-18 ENCOUNTER — Telehealth: Payer: Self-pay | Admitting: Family Medicine

## 2016-07-18 NOTE — Telephone Encounter (Signed)
Labs reviewed with wife

## 2016-07-29 ENCOUNTER — Other Ambulatory Visit (HOSPITAL_COMMUNITY)
Admission: RE | Admit: 2016-07-29 | Discharge: 2016-07-29 | Disposition: A | Discharge status: Home / Self Care | Payer: Medicare Other | Source: Other Acute Inpatient Hospital | Attending: Urology | Admitting: Urology

## 2016-07-29 ENCOUNTER — Ambulatory Visit (INDEPENDENT_AMBULATORY_CARE_PROVIDER_SITE_OTHER): Payer: Medicare Other | Admitting: Urology

## 2016-07-29 DIAGNOSIS — Z8546 Personal history of malignant neoplasm of prostate: Secondary | ICD-10-CM | POA: Diagnosis not present

## 2016-07-29 DIAGNOSIS — R800 Isolated proteinuria: Secondary | ICD-10-CM | POA: Diagnosis not present

## 2016-07-29 DIAGNOSIS — N3941 Urge incontinence: Secondary | ICD-10-CM | POA: Diagnosis not present

## 2016-07-29 DIAGNOSIS — N189 Chronic kidney disease, unspecified: Secondary | ICD-10-CM

## 2016-07-29 LAB — URINALYSIS, ROUTINE W REFLEX MICROSCOPIC
Bacteria, UA: NONE SEEN
Bilirubin Urine: NEGATIVE
GLUCOSE, UA: NEGATIVE mg/dL
Hgb urine dipstick: NEGATIVE
Ketones, ur: NEGATIVE mg/dL
Leukocytes, UA: NEGATIVE
Nitrite: NEGATIVE
PROTEIN: 100 mg/dL — AB
Specific Gravity, Urine: 1.014 (ref 1.005–1.030)
pH: 5 (ref 5.0–8.0)

## 2016-08-22 ENCOUNTER — Telehealth: Payer: Self-pay | Admitting: Family Medicine

## 2016-08-23 NOTE — Telephone Encounter (Signed)
PT Denied

## 2016-10-23 IMAGING — CR DG ABDOMEN 1V
2 series · 2 of 2 positions shown · non-contrast
Comparison: [HOSPITAL] CT Abdomen and Pelvis
07/07/2009

CLINICAL DATA: 73-year-old male with left flank pain and left side
back pain. Initial encounter. Personal history of kidney stones.
Initial encounter.

EXAM:
ABDOMEN - 1 VIEW

[view not recorded (1 of 2)]
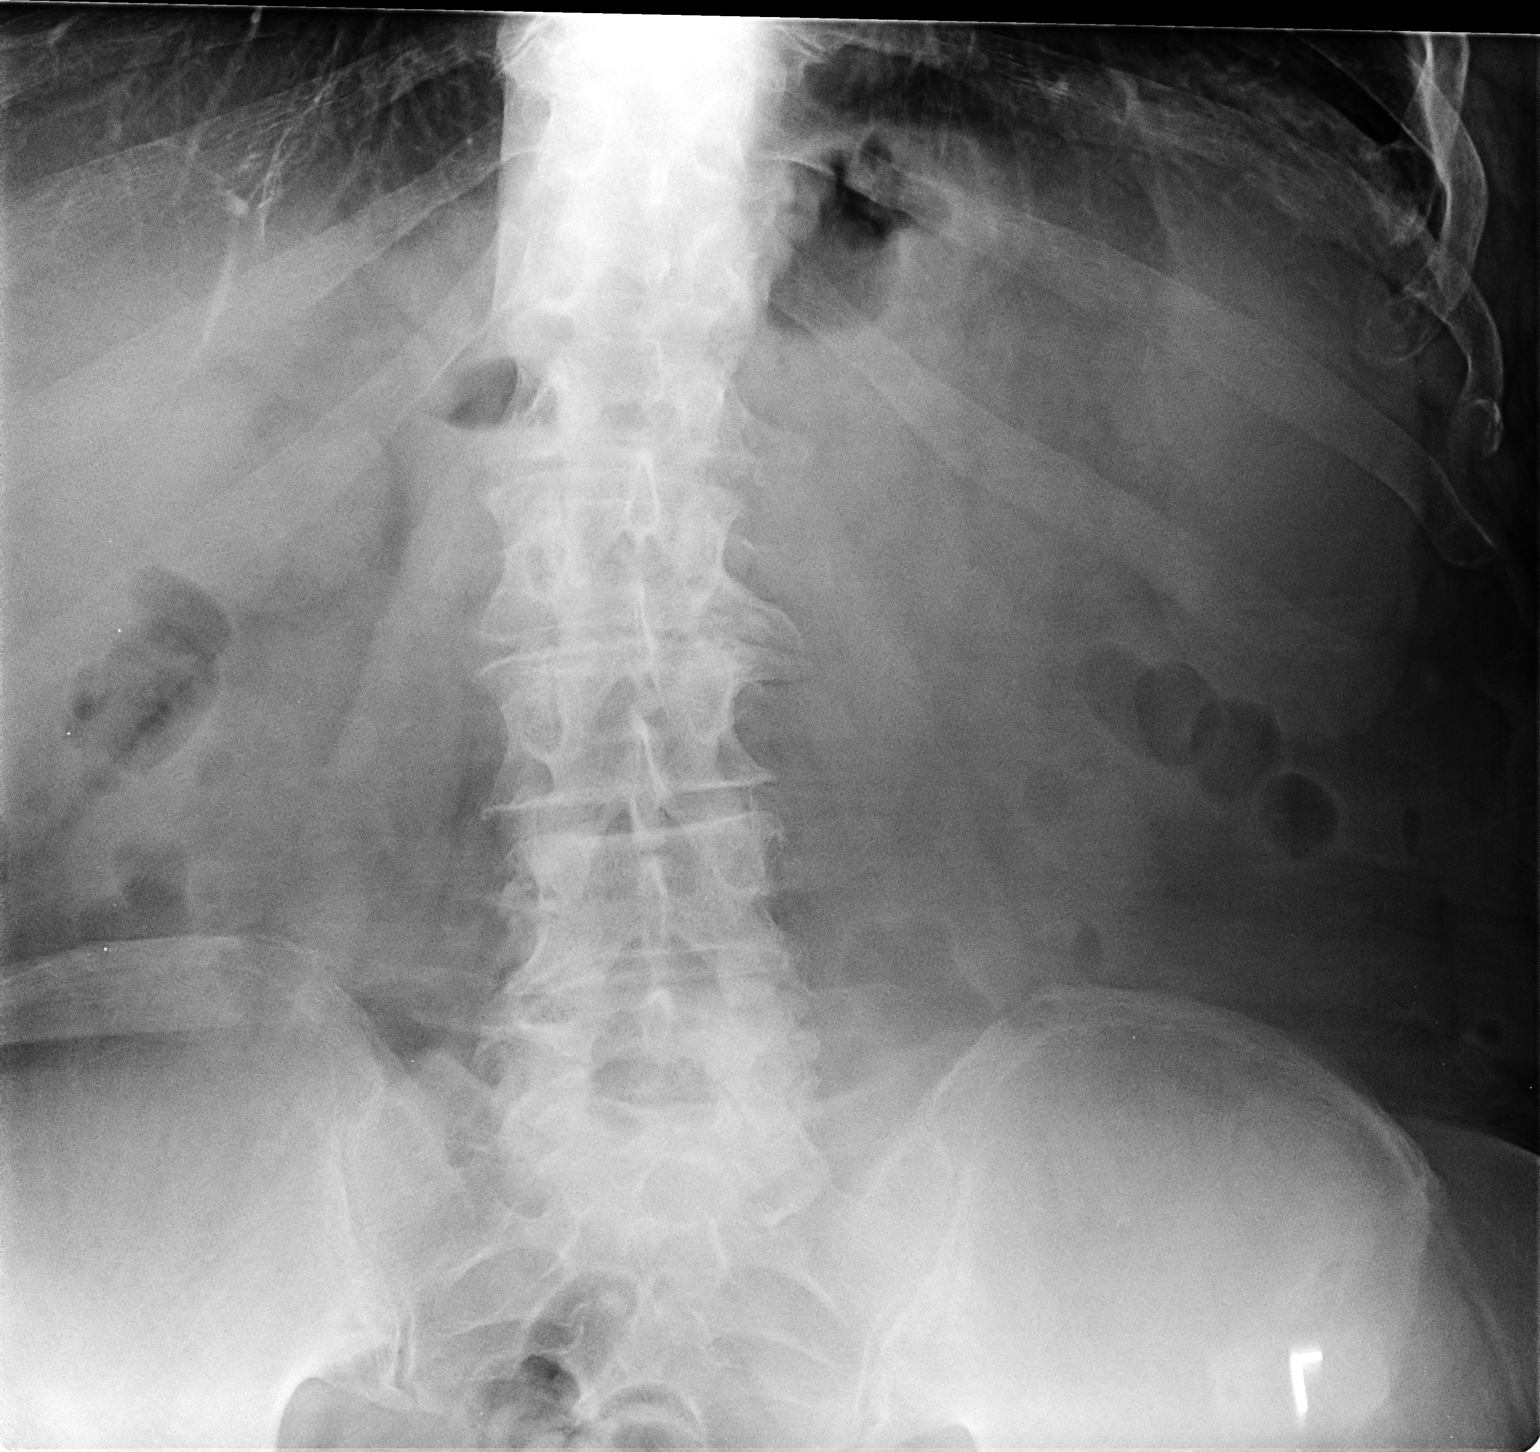

[view not recorded (2 of 2)]
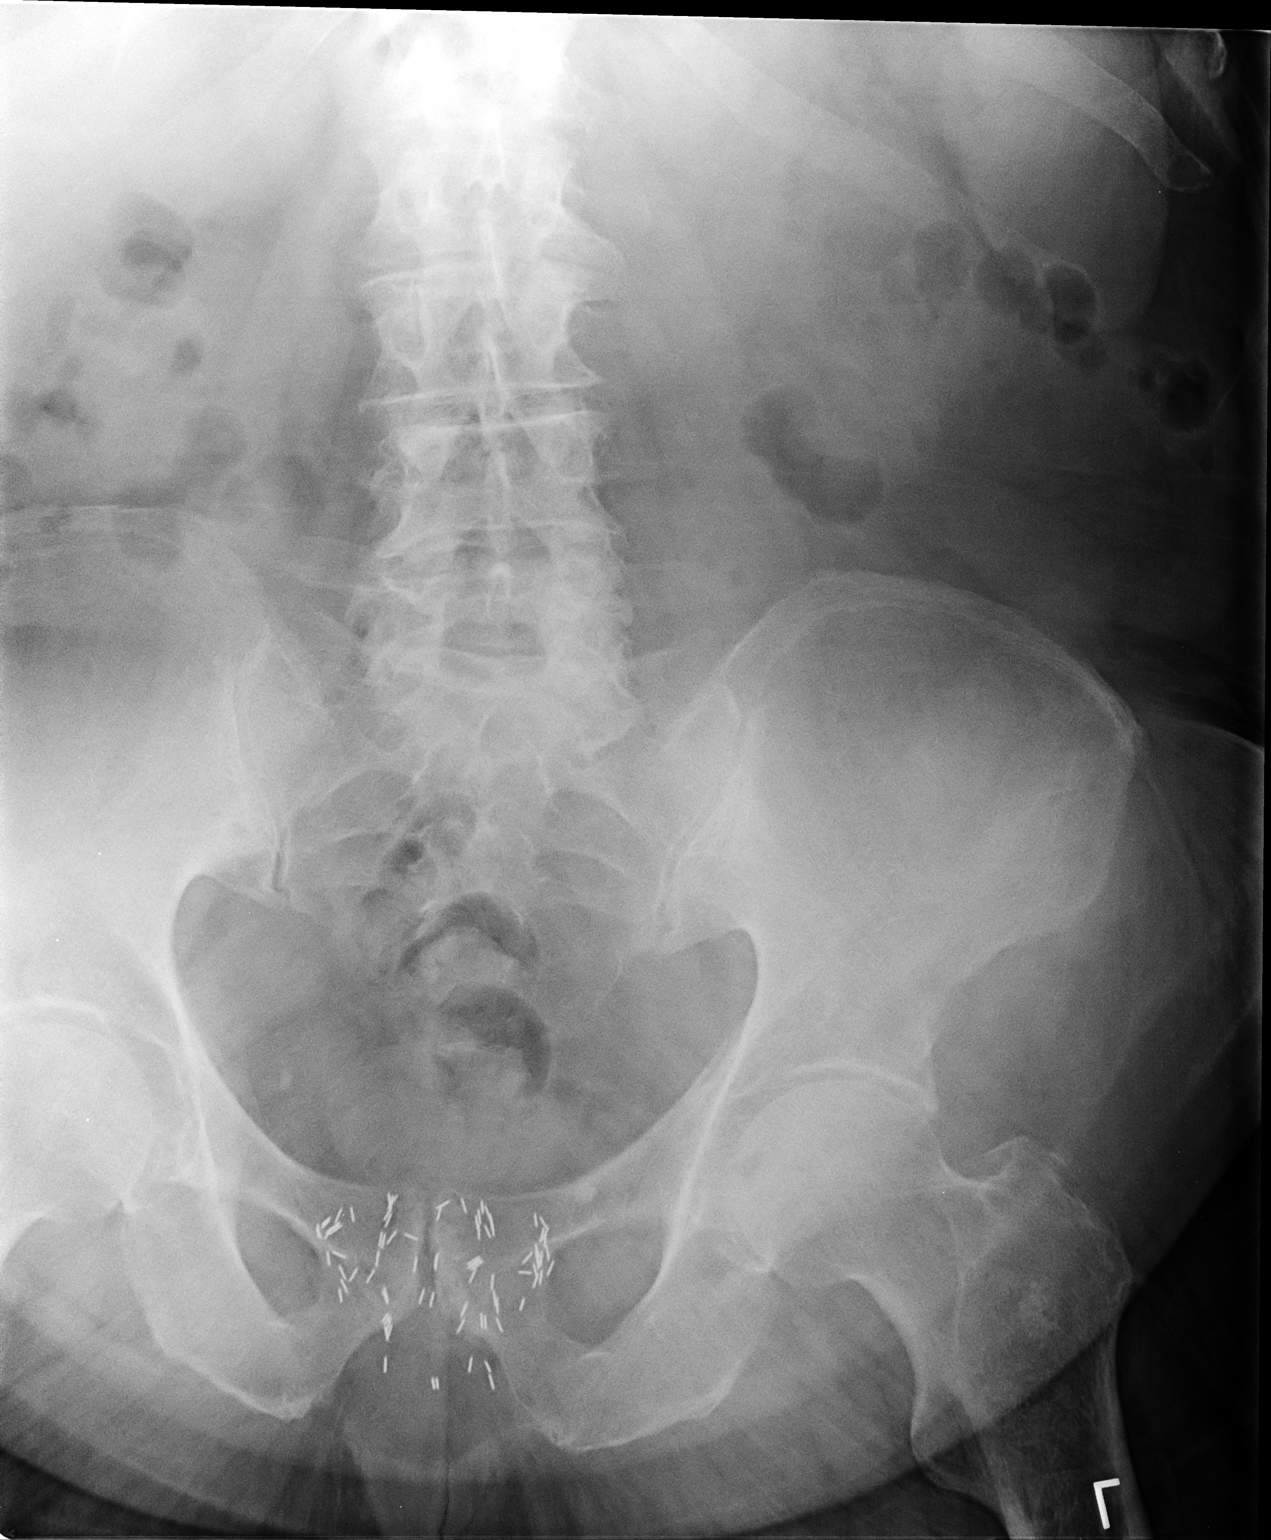

[2 of 2 positions shown; findings below may reference images not displayed]

FINDINGS: Sequelae of prostate brachytherapy. Non obstructed bowel gas
pattern. Stable chronic phleboliths in the pelvis including along
the right aspect of the bladder. No urologic calculus identified.
Degenerative changes in the spine with mild scoliosis. No acute
osseous abnormality identified.
IMPRESSION: No urologic calculus identified radiographically. Normal bowel gas
pattern.

## 2016-11-22 ENCOUNTER — Ambulatory Visit (INDEPENDENT_AMBULATORY_CARE_PROVIDER_SITE_OTHER): Payer: Medicare Other | Admitting: Family Medicine

## 2016-11-22 ENCOUNTER — Encounter: Payer: Self-pay | Admitting: Family Medicine

## 2016-11-22 VITALS — BP 136/74 | HR 76 | Temp 96.9°F | Ht 67.0 in | Wt 241.0 lb

## 2016-11-22 DIAGNOSIS — E119 Type 2 diabetes mellitus without complications: Secondary | ICD-10-CM

## 2016-11-22 DIAGNOSIS — M170 Bilateral primary osteoarthritis of knee: Secondary | ICD-10-CM | POA: Diagnosis not present

## 2016-11-22 DIAGNOSIS — N183 Chronic kidney disease, stage 3 unspecified: Secondary | ICD-10-CM

## 2016-11-22 DIAGNOSIS — E7800 Pure hypercholesterolemia, unspecified: Secondary | ICD-10-CM

## 2016-11-22 DIAGNOSIS — I10 Essential (primary) hypertension: Secondary | ICD-10-CM

## 2016-11-22 DIAGNOSIS — H6123 Impacted cerumen, bilateral: Secondary | ICD-10-CM

## 2016-11-22 DIAGNOSIS — I70209 Unspecified atherosclerosis of native arteries of extremities, unspecified extremity: Secondary | ICD-10-CM

## 2016-11-22 DIAGNOSIS — I129 Hypertensive chronic kidney disease with stage 1 through stage 4 chronic kidney disease, or unspecified chronic kidney disease: Secondary | ICD-10-CM | POA: Diagnosis not present

## 2016-11-22 DIAGNOSIS — I2699 Other pulmonary embolism without acute cor pulmonale: Secondary | ICD-10-CM | POA: Diagnosis not present

## 2016-11-22 DIAGNOSIS — C439 Malignant melanoma of skin, unspecified: Secondary | ICD-10-CM

## 2016-11-22 DIAGNOSIS — Z789 Other specified health status: Secondary | ICD-10-CM

## 2016-11-22 DIAGNOSIS — C61 Malignant neoplasm of prostate: Secondary | ICD-10-CM

## 2016-11-22 DIAGNOSIS — E1151 Type 2 diabetes mellitus with diabetic peripheral angiopathy without gangrene: Secondary | ICD-10-CM

## 2016-11-22 DIAGNOSIS — E559 Vitamin D deficiency, unspecified: Secondary | ICD-10-CM

## 2016-11-22 LAB — HM DIABETES EYE EXAM

## 2016-11-22 LAB — BAYER DCA HB A1C WAIVED: HB A1C: 6.2 % (ref <7.0)

## 2016-11-22 MED ORDER — RIVAROXABAN 20 MG PO TABS: ORAL_TABLET | ORAL | 11 refills | Status: DC

## 2016-11-22 NOTE — Patient Instructions (Addendum)
Medicare Annual Wellness Visit  [facility] and the medical providers at [facility] strive to bring you the best medical care.  In doing so we not only want to address your current medical conditions and concerns but also to detect new conditions early and prevent illness, disease and health-related problems.    Medicare offers a yearly Wellness Visit which allows our clinical staff to assess your need for preventative services including immunizations, lifestyle education, counseling to decrease risk of preventable diseases and screening for fall risk and other medical concerns.    This visit is provided free of charge (no copay) for all Medicare recipients. The clinical pharmacists at [facility] have begun to conduct these Wellness Visits which will also include a thorough review of all your medications.    As you primary medical provider recommend that you make an appointment for your Annual Wellness Visit if you have not done so already this year.  You may set up this appointment before you leave today or you may call back ([phone]) and schedule an appointment.  Please make sure when you call that you mention that you are scheduling your Annual Wellness Visit with the clinical pharmacist so that the appointment may be made for the proper length of time.     Continue current medications. Continue good therapeutic lifestyle changes which include good diet and exercise. Fall precautions discussed with patient. If an FOBT was given today- please return it to our front desk. If you are over 4 years old - you may need Prevnar 67 or the adult Pneumonia vaccine.  **Flu shots are available--- please call and schedule a FLU-CLINIC appointment**  After your visit with [name] today you will receive a survey in the mail or online from [employer] regarding your care with [name]. Please take a moment to fill this out. Your feedback is very  important to [name] as you can help [name] better understand your patient needs as well as improve your experience and satisfaction. WE CARE ABOUT YOU!!!   Get eye exam as planned and make sure we get a copy of that report Follow-up with dermatology nephrology and urology as planned Stay active physically and watch diet closely and drink plenty of water

## 2016-11-22 NOTE — Progress Notes (Signed)
Subjective:    Patient ID: Ricardo Huguenin., male    DOB: [birth year], 76 y.o.   MRN: [id number]  HPI Pt here for follow up and management of chronic medical problems which includes diabetes, hyperlipidemia and hypertension. He is taking medication regularly.The patient has prostate cancer and a history of melanoma. He also has decreased renal function with chronic kidney disease along with his diabetes and hyperlipidemia. He is going to get his diabetic eye exam today. He will make sure that we get a copy of that report. Is also going to get a urine microalbumin today in this office. He will be given an FOBT to return and will get lab work. He is requesting a refill on his Xarelto. He is due to get a chest x-ray in the fall of this year. Patient is currently not taking anything for his diabetes. His last hemoglobin A1c was 5.8%. He is controlling it up to this point with his diet. The patient sees Dr. Posey Pronto, Dr. Christ Kick, and Dr. Jannifer Franklin and Dr. Jeffie Pollock. The patient no longer sees the heart doctor. He does see the dermatologist fairly regularly because of his history of melanoma and frequent basal cell cancers. He is staying active and still works 24-36 hours per week. He denies any chest pain or shortness of breath anymore than usual. She denies any trouble with his stomach including nausea vomiting diarrhea blood in the stool or black tarry bowel movements. As mentioned he still sees the urologist and has had seeds implanted in the prostate. He still has urgency and frequency. He follows up continuously with the urologist.    Patient Active Problem List   Diagnosis Date Noted  . Pure hypercholesterolemia 07/13/2016  . Essential hypertension 07/13/2016  . Vitamin D deficiency 07/13/2016  . Morbid obesity due to excess calories ([place]) 05/27/2016  . Anemia of chronic disease   . LOC (loss of consciousness) ([place]) 04/08/2016  . TIA (transient ischemic attack) 04/08/2016  . 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  S/P knee replacement 03/21/2016  . Anemia associated with chronic renal failure 10/28/2015  . Hyperparathyroidism , secondary, non-renal ([place]) 10/28/2015  . Pulmonary emboli ([place]) 08/27/2015  . Near syncope 08/25/2015  . DOE (dyspnea on exertion) 06/02/2015  . Controlled type 2 diabetes mellitus ([place]) 06/02/2015  . Melanoma, history of 10/09/2013  . 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 Chronic kidney disease, stage III (moderate) 10/09/2013  . Venous (peripheral) insufficiency 10/09/2013  . Statin intolerance 10/09/2013  . Prostate cancer ([place]) 12/25/2012   Outpatient Encounter Prescriptions as of 11/22/2016  Medication Sig  . amLODipine-valsartan (EXFORGE) 10-320 MG tablet TAKE 1 TABLET BY MOUTH DAILY.  . fenofibrate 160 MG tablet TAKE 1 TABLET BY MOUTH DAILY (Patient taking differently: TAKE 1 TABLET (160 mg) BY MOUTH DAILY)  . furosemide (LASIX) 20 MG tablet Take 20 mg by mouth daily as needed.  Marland Kitchen glucose blood (ONE TOUCH ULTRA TEST) test strip USE FOR TESTING SUGAR 2 to 3 TIMES DAILY E11.9  . rivaroxaban (XARELTO) 20 MG TABS tablet Take one tablet by mouth daily with food (Patient taking differently: Take 20 mg by mouth daily with breakfast. )  . vitamin B-12 (CYANOCOBALAMIN) 1000 MCG tablet Take 1,000 mcg by mouth daily.  . [DISCONTINUED] MYRBETRIQ 50 MG TB24 tablet Take 1 tablet (50 mg total) by mouth daily with breakfast.  . [DISCONTINUED] sitaGLIPtin (JANUVIA) 50 MG tablet TAKE 1 TABLET (50 MG TOTAL) BY MOUTH DAILY.  No facility-administered encounter medications on file as of 11/22/2016.       Review of Systems  Constitutional: Negative.   HENT: Negative.   Eyes: Negative.   Respiratory: Negative.   Cardiovascular: Negative.   Gastrointestinal: Negative.   Endocrine: Negative.   Genitourinary: Negative.   Musculoskeletal: Negative.   Skin: Negative.   Allergic/Immunologic: Negative.   Neurological: Negative.   Hematological:  Negative.   Psychiatric/Behavioral: Negative.        Objective:   Physical Exam  Constitutional: He is oriented to person, place, and time. He appears well-developed and well-nourished.  The patient is pleasant and alert and answers all questions appropriately.  HENT:  Head: Normocephalic and atraumatic.  Nose: Nose normal.  Mouth/Throat: Oropharynx is clear and moist. No oropharyngeal exudate.  Bilateral ear cerumen  Eyes: Conjunctivae and EOM are normal. Pupils are equal, round, and reactive to light. Right eye exhibits no discharge. Left eye exhibits no discharge. No scleral icterus.  Patient plans to get an eye exam today and he will make sure that we get a copy of that report sent to [name].  Neck: Normal range of motion. Neck supple. No thyromegaly present.  No bruits thyromegaly or anterior cervical adenopathy  Cardiovascular: Normal rate, regular rhythm and normal heart sounds.   No murmur heard. The pulses were hard to find in the left lower extremity than the right. The heart is regular at 72/m  Pulmonary/Chest: Effort normal and breath sounds normal. No respiratory distress. He has no wheezes. He has no rales. He exhibits no tenderness.  Morbid obesity weight is up by 5 pounds since the last visit.  Abdominal: Soft. Bowel sounds are normal. He exhibits no mass. There is no tenderness. There is no rebound and no guarding.  No masses tenderness or organ enlargement palpable.  Genitourinary:  Genitourinary Comments: The patient is followed regularly by the urologist about every 6 months.  Musculoskeletal: Normal range of motion. He exhibits no edema.  The patient has had a left knee replacement and is followed fairly regularly by the orthopedic surgeon because of problems with the left knee as well as ongoing problems with the right knee.  Lymphadenopathy:    He has no cervical adenopathy.  Neurological: He is alert and oriented to person, place, and time. He has normal reflexes.  No cranial nerve deficit.  Skin: Skin is warm and dry. No rash noted.  Psychiatric: He has a normal mood and affect. His behavior is normal. Judgment and thought content normal.  Nursing note and vitals reviewed.   BP 136/74 (BP Location: Left Arm)   Pulse 76   Temp (!) 96.9 F (36.1 C) (Oral)   Ht '5\' 7"'  (1.702 m)   Wt 241 lb (109.3 kg)   BMI 37.75 kg/m        Assessment & Plan:  1. Chronic kidney disease, stage III (moderate) -The patient will continue to follow-up with the nephrologist every 6 months. - CBC with Differential/Platelet - BMP8+EGFR  2. Prostate cancer ([place]) -The patient will continue to follow-up with the urologist every 6 months for this. Everything appears to be stable other than he has urinary urgency. - CBC with Differential/Platelet  3. Controlled type 2 diabetes mellitus without complication, without long-term current use of insulin ([place]) -The patient is currently off of his blood sugar lowering medicines. His last hemoglobin A1c was 5.8%. We will monitor this closely and make sure there is no reason for restarting this medicine. We will also encourage  him to follow-up aggressive therapeutic lifestyle changes. - CBC with Differential/Platelet - BMP8+EGFR - Bayer DCA Hb A1c Waived - Microalbumin / creatinine urine ratio  4. Essential hypertension -The blood pressure is good today at 136/74 and he will continue to watch his sodium intake - CBC with Differential/Platelet - BMP8+EGFR - Hepatic function panel  5. Pure hypercholesterolemia -Continue with aggressive therapeutic lifestyle changes and fenofibrate - CBC with Differential/Platelet - NMR, lipoprofile  6. Vitamin D deficiency - CBC with Differential/Platelet - VITAMIN D 25 Hydroxy (Vit-D Deficiency, Fractures)  7. Excessive cerumen in ear canal, bilateral -Ear irrigation today to remove excessive cerumen  8. Morbid obesity ([place]) -Continue to follow-up aggressive therapeutic lifestyle  changes through diet and exercise  9. Statin intolerance -The patient has been statin intolerant.  10. 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 Melanoma of skin ([place]) -Continue regular follow-up with dermatology to monitor for melanoma recurrence and to follow for basal cell skin cancers  12. Primary osteoarthritis of both knees -Follow-up with orthopedics as planned and Dr. Alvan Dame  13. Pulmonary emboli -Continue with Xarelto  Meds ordered this encounter  Medications  . rivaroxaban (XARELTO) 20 MG TABS tablet    Sig: Take one tablet by mouth daily with food    Dispense:  30 tablet    Refill:  11   Patient Instructions                       Medicare Annual Wellness Visit  [facility] and the medical providers at [facility] strive to bring you the best medical care.  In doing so we not only want to address your current medical conditions and concerns but also to detect new conditions early and prevent illness, disease and health-related problems.    Medicare offers a yearly Wellness Visit which allows our clinical staff to assess your need for preventative services including immunizations, lifestyle education, counseling to decrease risk of preventable diseases and screening for fall risk and other medical concerns.    This visit is provided free of charge (no copay) for all Medicare recipients. The clinical pharmacists at [facility] have begun to conduct these Wellness Visits which will also include a thorough review of all your medications.    As you primary medical provider recommend that you make an appointment for your Annual Wellness Visit if you have not done so already this year.  You may set up this appointment before you leave today or you may call back ([phone]) and schedule an appointment.  Please make sure when you call that you mention that  you are scheduling your Annual Wellness Visit with the clinical pharmacist so that the appointment may be made for the proper length of time.     Continue current medications. Continue good therapeutic lifestyle changes which include good diet and exercise. Fall precautions discussed with patient. If an FOBT was given today- please return it to our front desk. If you are over 17 years old - you may need Prevnar 67 or the adult Pneumonia vaccine.  **Flu shots are available--- please call and schedule a FLU-CLINIC appointment**  After your visit with [name] today you will receive a survey in the mail or online from [employer] regarding your care with [name]. Please take a moment to fill this out. Your feedback is very important to [name] as you can help [name] better understand your patient needs  as well as improve your experience and satisfaction. WE CARE ABOUT YOU!!!     Arrie Senate MD   .

## 2016-11-23 ENCOUNTER — Other Ambulatory Visit: Payer: Self-pay | Admitting: Physician Assistant

## 2016-11-23 DIAGNOSIS — D492 Neoplasm of unspecified behavior of bone, soft tissue, and skin: Secondary | ICD-10-CM | POA: Diagnosis not present

## 2016-11-23 DIAGNOSIS — D225 Melanocytic nevi of trunk: Secondary | ICD-10-CM | POA: Diagnosis not present

## 2016-11-23 DIAGNOSIS — D229 Melanocytic nevi, unspecified: Secondary | ICD-10-CM | POA: Diagnosis not present

## 2016-11-23 DIAGNOSIS — L570 Actinic keratosis: Secondary | ICD-10-CM | POA: Diagnosis not present

## 2016-11-23 DIAGNOSIS — Z85828 Personal history of other malignant neoplasm of skin: Secondary | ICD-10-CM | POA: Diagnosis not present

## 2016-11-23 LAB — NMR, LIPOPROFILE
Cholesterol: 151 mg/dL (ref 100–199)
HDL Cholesterol by NMR: 35 mg/dL — ABNORMAL LOW (ref >39)
HDL Particle Number: 25.8 umol/L — ABNORMAL LOW (ref >=30.5)
LDL Particle Number: 455 nmol/L (ref <1000)
LDL-C: 79 mg/dL (ref 0–99)
LP-IR SCORE: 80 — AB (ref <=45)
Small LDL Particle Number: <90 nmol/L (ref <=527)
Triglycerides by NMR: 185 mg/dL — ABNORMAL HIGH (ref 0–149)

## 2016-11-23 LAB — BMP8+EGFR
BUN / CREAT RATIO: 15 (ref 10–24)
BUN: 25 mg/dL (ref 8–27)
CHLORIDE: 102 mmol/L (ref 96–106)
CO2: 20 mmol/L (ref 18–29)
Calcium: 9.2 mg/dL (ref 8.6–10.2)
Creatinine, Ser: 1.63 mg/dL — ABNORMAL HIGH (ref 0.76–1.27)
GFR calc Af Amer: 47 mL/min/{1.73_m2} — ABNORMAL LOW (ref >59)
GFR calc non Af Amer: 41 mL/min/{1.73_m2} — ABNORMAL LOW (ref >59)
GLUCOSE: 124 mg/dL — AB (ref 65–99)
POTASSIUM: 4.7 mmol/L (ref 3.5–5.2)
SODIUM: 139 mmol/L (ref 134–144)

## 2016-11-23 LAB — HEPATIC FUNCTION PANEL
ALT: 14 IU/L (ref 0–44)
AST: 19 IU/L (ref 0–40)
Albumin: 4 g/dL (ref 3.5–4.8)
Alkaline Phosphatase: 84 IU/L (ref 39–117)
Bilirubin Total: 0.4 mg/dL (ref 0.0–1.2)
Bilirubin, Direct: 0.1 mg/dL (ref 0.00–0.40)
TOTAL PROTEIN: 6.1 g/dL (ref 6.0–8.5)

## 2016-11-23 LAB — CBC WITH DIFFERENTIAL/PLATELET
BASOS ABS: 0 10*3/uL (ref 0.0–0.2)
Basos: 0 %
EOS (ABSOLUTE): 0.1 10*3/uL (ref 0.0–0.4)
Eos: 1 %
Hematocrit: 43.5 % (ref 37.5–51.0)
Hemoglobin: 15.1 g/dL (ref 13.0–17.7)
Immature Grans (Abs): 0 10*3/uL (ref 0.0–0.1)
Immature Granulocytes: 0 %
Lymphocytes Absolute: 2.2 10*3/uL (ref 0.7–3.1)
Lymphs: 26 %
MCH: 29.8 pg (ref 26.6–33.0)
MCHC: 34.7 g/dL (ref 31.5–35.7)
MCV: 86 fL (ref 79–97)
Monocytes Absolute: 0.6 10*3/uL (ref 0.1–0.9)
Monocytes: 7 %
NEUTROS ABS: 5.4 10*3/uL (ref 1.4–7.0)
NEUTROS PCT: 66 %
PLATELETS: 175 10*3/uL (ref 150–379)
RBC: 5.06 x10E6/uL (ref 4.14–5.80)
RDW: 16.7 % — ABNORMAL HIGH (ref 12.3–15.4)
WBC: 8.3 10*3/uL (ref 3.4–10.8)

## 2016-11-23 LAB — MICROALBUMIN / CREATININE URINE RATIO
CREATININE, UR: 121.3 mg/dL
MICROALBUM., U, RANDOM: 1564.2 ug/mL
Microalb/Creat Ratio: 1289.5 mg/g creat — ABNORMAL HIGH (ref 0.0–30.0)

## 2016-11-23 LAB — VITAMIN D 25 HYDROXY (VIT D DEFICIENCY, FRACTURES): VIT D 25 HYDROXY: 23.4 ng/mL — AB (ref 30.0–100.0)

## 2016-12-01 ENCOUNTER — Telehealth: Payer: Self-pay | Admitting: Family Medicine

## 2016-12-01 NOTE — Telephone Encounter (Signed)
What is the name of the medication? januvia  Have you contacted your pharmacy to request a refill? yes  Which pharmacy would you like this sent to? [facility]   Patient notified that their request is being sent to the clinical staff for review and that they should receive a call once it is complete. If they do not receive a call within 24 hours they can check with their pharmacy or our office.

## 2016-12-01 NOTE — Telephone Encounter (Signed)
Busy - ? Pt had said he was not taking this.

## 2016-12-01 NOTE — Telephone Encounter (Signed)
Says d/c on last visit?

## 2016-12-08 ENCOUNTER — Other Ambulatory Visit: Payer: Medicare Other

## 2016-12-08 DIAGNOSIS — N183 Chronic kidney disease, stage 3 (moderate): Secondary | ICD-10-CM | POA: Diagnosis not present

## 2016-12-08 DIAGNOSIS — N189 Chronic kidney disease, unspecified: Secondary | ICD-10-CM | POA: Diagnosis not present

## 2016-12-08 DIAGNOSIS — N2581 Secondary hyperparathyroidism of renal origin: Secondary | ICD-10-CM | POA: Diagnosis not present

## 2016-12-15 DIAGNOSIS — N183 Chronic kidney disease, stage 3 (moderate): Secondary | ICD-10-CM | POA: Diagnosis not present

## 2016-12-15 DIAGNOSIS — D631 Anemia in chronic kidney disease: Secondary | ICD-10-CM | POA: Diagnosis not present

## 2016-12-15 DIAGNOSIS — I129 Hypertensive chronic kidney disease with stage 1 through stage 4 chronic kidney disease, or unspecified chronic kidney disease: Secondary | ICD-10-CM | POA: Diagnosis not present

## 2016-12-15 DIAGNOSIS — N2581 Secondary hyperparathyroidism of renal origin: Secondary | ICD-10-CM | POA: Diagnosis not present

## 2016-12-27 ENCOUNTER — Telehealth: Payer: Self-pay | Admitting: Family Medicine

## 2016-12-29 ENCOUNTER — Other Ambulatory Visit: Payer: Self-pay | Admitting: Family Medicine

## 2016-12-29 ENCOUNTER — Ambulatory Visit (INDEPENDENT_AMBULATORY_CARE_PROVIDER_SITE_OTHER): Payer: Medicare Other | Admitting: Family Medicine

## 2016-12-29 ENCOUNTER — Encounter: Payer: Self-pay | Admitting: Family Medicine

## 2016-12-29 ENCOUNTER — Ambulatory Visit (HOSPITAL_COMMUNITY): Admission: RE | Admit: 2016-12-29 | Payer: Medicare Other | Source: Ambulatory Visit

## 2016-12-29 VITALS — BP 131/63 | HR 71 | Temp 96.8°F | Ht 67.0 in | Wt 236.0 lb

## 2016-12-29 DIAGNOSIS — N451 Epididymitis: Secondary | ICD-10-CM

## 2016-12-29 DIAGNOSIS — E1151 Type 2 diabetes mellitus with diabetic peripheral angiopathy without gangrene: Secondary | ICD-10-CM | POA: Diagnosis not present

## 2016-12-29 DIAGNOSIS — I70209 Unspecified atherosclerosis of native arteries of extremities, unspecified extremity: Secondary | ICD-10-CM

## 2016-12-29 DIAGNOSIS — N5089 Other specified disorders of the male genital organs: Secondary | ICD-10-CM

## 2016-12-29 LAB — URINALYSIS, COMPLETE
Bilirubin, UA: NEGATIVE
GLUCOSE, UA: NEGATIVE
Ketones, UA: NEGATIVE
LEUKOCYTES UA: NEGATIVE
Nitrite, UA: NEGATIVE
Specific Gravity, UA: 1.02 (ref 1.005–1.030)
Urobilinogen, Ur: 0.2 mg/dL (ref 0.2–1.0)
pH, UA: 5 (ref 5.0–7.5)

## 2016-12-29 LAB — MICROSCOPIC EXAMINATION
Bacteria, UA: NONE SEEN
Epithelial Cells (non renal): NONE SEEN /hpf (ref 0–10)
RENAL EPITHEL UA: NONE SEEN /HPF

## 2016-12-29 MED ORDER — CEPHALEXIN 500 MG PO CAPS: 500.0000 mg | ORAL_CAPSULE | Freq: Three times a day (TID) | ORAL | 0 refills | Status: DC

## 2016-12-29 NOTE — Patient Instructions (Signed)
Continue to drink plenty of fluids Take antibiotic as directed We will schedule a scrotal ultrasound to further evaluate the swelling We will ask you to come back in one week or sooner if you get worse Wear good support briefs if possible

## 2016-12-29 NOTE — Progress Notes (Signed)
Subjective:    Patient ID: Ricardo Mayer., male    DOB: 1941/05/19, 76 y.o.   MRN: [id number]  HPI Patient here today for an left enlarged testicle. This first started 3 days ago. The patient was taking a shower. He said he just noticed swelling in the left testicle. There is not been any injury. It is not painful. He has had no problem like this in the past. He's passing his water without problems.    Patient Active Problem List   Diagnosis Date Noted  . Pure hypercholesterolemia 07/13/2016  . Essential hypertension 07/13/2016  . Vitamin D deficiency 07/13/2016  . Morbid obesity due to excess calories ([place]) 05/27/2016  . Anemia of chronic disease   . LOC (loss of consciousness) ([place]) 04/08/2016  . TIA (transient ischemic attack) 04/08/2016  . 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 S/P knee replacement 03/21/2016  . Anemia associated with chronic renal failure 10/28/2015  . Hyperparathyroidism , secondary, non-renal ([place]) 10/28/2015  . Pulmonary emboli ([place]) 08/27/2015  . Near syncope 08/25/2015  . DOE (dyspnea on exertion) 06/02/2015  . Controlled type 2 diabetes mellitus ([place]) 06/02/2015  . Melanoma, history of 10/09/2013  . 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 Chronic kidney disease, stage III (moderate) 10/09/2013  . Venous (peripheral) insufficiency 10/09/2013  . Statin intolerance 10/09/2013  . Prostate cancer ([place]) 12/25/2012   Outpatient Encounter Prescriptions as of 12/29/2016  Medication Sig  . amLODipine-valsartan (EXFORGE) 10-320 MG tablet TAKE 1 TABLET BY MOUTH DAILY.  . fenofibrate 160 MG tablet TAKE 1 TABLET BY MOUTH DAILY (Patient taking differently: TAKE 1 TABLET (160 mg) BY MOUTH DAILY)  . furosemide (LASIX) 20 MG tablet Take 20 mg by mouth daily as needed.  Marland Kitchen glucose blood (ONE TOUCH ULTRA TEST) test strip USE FOR TESTING SUGAR 2 to 3 TIMES DAILY E11.9  . rivaroxaban (XARELTO) 20 MG TABS tablet Take one tablet by mouth  daily with food  . vitamin B-12 (CYANOCOBALAMIN) 1000 MCG tablet Take 1,000 mcg by mouth daily.   No facility-administered encounter medications on file as of 12/29/2016.       Review of Systems  Constitutional: Negative.   HENT: Negative.   Eyes: Negative.   Respiratory: Negative.   Cardiovascular: Negative.   Gastrointestinal: Negative.   Endocrine: Negative.   Genitourinary: Positive for scrotal swelling (left side).  Musculoskeletal: Negative.   Skin: Negative.   Allergic/Immunologic: Negative.   Neurological: Negative.   Hematological: Negative.   Psychiatric/Behavioral: Negative.        Objective:   Physical Exam  Constitutional: He appears well-developed and well-nourished. No distress.  Genitourinary: Penis normal.  Genitourinary Comments: No hernia was palpated on either side. The left scrotal area is fuller than the right and the left super testicular area is thickened on the left. There is slight tenderness with palpation of this.  Musculoskeletal: Normal range of motion.  Neurological: He is alert.  Skin: Skin is warm and dry. No rash noted.  Psychiatric: He has a normal mood and affect. His behavior is normal. Judgment and thought content normal.  Nursing note and vitals reviewed.  BP 131/63 (BP Location: Left Arm)   Pulse 71   Temp (!) 96.8 F (36 C) (Oral)   Ht 5\' 7"  (1.702 m)   Wt 236 lb (107 kg)   BMI 36.96 kg/m    The urinalysis was within normal limits     Assessment & Plan:  1. Enlarged testicle -  Scrotal ultrasound - Urinalysis, Complete  2. Epididymitis -Keflex 500 mg 3 times daily for 10 days -Return to clinic in one week for recheck  Meds ordered this encounter  Medications  . cephALEXin (KEFLEX) 500 MG capsule    Sig: Take 1 capsule (500 mg total) by mouth 3 (three) times daily.    Dispense:  30 capsule    Refill:  0     Patient Instructions  Continue to drink plenty of fluids Take antibiotic as directed We will schedule a  scrotal ultrasound to further evaluate the swelling We will ask you to come back in one week or sooner if you get worse Wear good support briefs if possible  Arrie Senate MD

## 2016-12-30 ENCOUNTER — Ambulatory Visit (HOSPITAL_COMMUNITY)
Admission: RE | Admit: 2016-12-30 | Discharge: 2016-12-30 | Disposition: A | Discharge status: Home / Self Care | Payer: Medicare Other | Source: Ambulatory Visit | Attending: Family Medicine | Admitting: Family Medicine

## 2016-12-30 DIAGNOSIS — N433 Hydrocele, unspecified: Secondary | ICD-10-CM | POA: Insufficient documentation

## 2016-12-30 DIAGNOSIS — N451 Epididymitis: Secondary | ICD-10-CM | POA: Diagnosis not present

## 2016-12-30 DIAGNOSIS — N5089 Other specified disorders of the male genital organs: Secondary | ICD-10-CM | POA: Insufficient documentation

## 2017-01-05 ENCOUNTER — Encounter: Payer: Self-pay | Admitting: Family Medicine

## 2017-01-05 ENCOUNTER — Ambulatory Visit (INDEPENDENT_AMBULATORY_CARE_PROVIDER_SITE_OTHER): Payer: Medicare Other | Admitting: Family Medicine

## 2017-01-05 ENCOUNTER — Other Ambulatory Visit: Payer: Self-pay | Admitting: Family Medicine

## 2017-01-05 VITALS — BP 123/74 | HR 77 | Temp 96.9°F | Ht 67.0 in | Wt 237.0 lb

## 2017-01-05 DIAGNOSIS — I70209 Unspecified atherosclerosis of native arteries of extremities, unspecified extremity: Secondary | ICD-10-CM

## 2017-01-05 DIAGNOSIS — N451 Epididymitis: Secondary | ICD-10-CM

## 2017-01-05 DIAGNOSIS — E1151 Type 2 diabetes mellitus with diabetic peripheral angiopathy without gangrene: Secondary | ICD-10-CM | POA: Diagnosis not present

## 2017-01-05 NOTE — Progress Notes (Signed)
Subjective:    Patient ID: Ricardo Mayer., male    DOB: October 12, 1940, 76 y.o.   MRN: [id number]  HPI Pt here for follow up of testicular swelling.The patient may be doing a little bit better but he has never had any pain associated with this. He has no other symptoms. He is taking antibiotic regularly. The ultrasound report was reviewed with him indicating a mild epididymitis. He has another half of the prescription to keep taking. He has an appointment with the urologist about the middle of June and he will take a copy of the report with the name of the antibiotic that he is been on with him just that visit for any further recommendations.    Patient Active Problem List   Diagnosis Date Noted  . Pure hypercholesterolemia 07/13/2016  . Essential hypertension 07/13/2016  . Vitamin D deficiency 07/13/2016  . Morbid obesity due to excess calories ([place]) 05/27/2016  . Anemia of chronic disease   . LOC (loss of consciousness) ([place]) 04/08/2016  . TIA (transient ischemic attack) 04/08/2016  . 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 S/P knee replacement 03/21/2016  . Anemia associated with chronic renal failure 10/28/2015  . Hyperparathyroidism , secondary, non-renal ([place]) 10/28/2015  . Pulmonary emboli ([place]) 08/27/2015  . Near syncope 08/25/2015  . DOE (dyspnea on exertion) 06/02/2015  . Controlled type 2 diabetes mellitus ([place]) 06/02/2015  . Melanoma, history of 10/09/2013  . 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 Chronic kidney disease, stage III (moderate) 10/09/2013  . Venous (peripheral) insufficiency 10/09/2013  . Statin intolerance 10/09/2013  . Prostate cancer ([place]) 12/25/2012   Outpatient Encounter Prescriptions as of 01/05/2017  Medication Sig  . amLODipine-valsartan (EXFORGE) 10-320 MG tablet TAKE 1 TABLET BY MOUTH DAILY.  . cephALEXin (KEFLEX) 500 MG capsule Take 1 capsule (500 mg total) by mouth 3 (three) times daily.  . fenofibrate 160 MG  tablet TAKE 1 TABLET BY MOUTH DAILY (Patient taking differently: TAKE 1 TABLET (160 mg) BY MOUTH DAILY)  . furosemide (LASIX) 20 MG tablet Take 20 mg by mouth daily as needed.  Marland Kitchen glucose blood (ONE TOUCH ULTRA TEST) test strip USE FOR TESTING SUGAR 2 to 3 TIMES DAILY E11.9  . rivaroxaban (XARELTO) 20 MG TABS tablet Take one tablet by mouth daily with food  . vitamin B-12 (CYANOCOBALAMIN) 1000 MCG tablet Take 1,000 mcg by mouth daily.   No facility-administered encounter medications on file as of 01/05/2017.       Review of Systems  Constitutional: Negative.   HENT: Negative.   Eyes: Negative.   Respiratory: Negative.   Cardiovascular: Negative.   Gastrointestinal: Negative.   Endocrine: Negative.   Genitourinary: Negative.        Testicular swelling  Musculoskeletal: Negative.   Skin: Negative.   Allergic/Immunologic: Negative.   Neurological: Negative.   Hematological: Negative.   Psychiatric/Behavioral: Negative.        Objective:   Physical Exam  Constitutional: He appears well-developed and well-nourished. No distress.  HENT:  Head: Normocephalic.  Eyes: Conjunctivae and EOM are normal. Pupils are equal, round, and reactive to light. Right eye exhibits no discharge. Left eye exhibits no discharge. No scleral icterus.  Neck: Normal range of motion.  Genitourinary: Penis normal.  Genitourinary Comments: The epididymis or the area above the left testicle does seem to be smaller than it was when it was previously checked. He still has another week's worth of antibiotics to take. There is no  tenderness. I believe there was less swelling than there was previously.  Musculoskeletal: Normal range of motion.  Neurological: He is alert.  Skin: Skin is warm and dry. No rash noted.  Psychiatric: He has a normal mood and affect. His behavior is normal. Judgment and thought content normal.     BP 123/74 (BP Location: Left Arm)   Pulse 77   Temp (!) 96.9 F (36.1 C) (Oral)   Ht  5\' 7"  (1.702 m)   Wt 237 lb (107.5 kg)   BMI 37.12 kg/m       Assessment & Plan:  1. Epididymitis, left -Continue and complete cephalexin -Take copy of ultrasound report with patient to visit the urologist for his regularly scheduled visit  Patient Instructions  The patient will finish his current Keflex He'll take a copy of the reports of the ultrasound with him to his urology visit in mid June. He will follow up with any recommendations from them regarding further treatment.  Arrie Senate MD

## 2017-01-05 NOTE — Patient Instructions (Signed)
The patient will finish his current Keflex He'll take a copy of the reports of the ultrasound with him to his urology visit in mid June. He will follow up with any recommendations from them regarding further treatment.

## 2017-01-19 ENCOUNTER — Other Ambulatory Visit: Payer: Medicare Other

## 2017-01-19 DIAGNOSIS — C61 Malignant neoplasm of prostate: Secondary | ICD-10-CM

## 2017-01-20 DIAGNOSIS — Z96651 Presence of right artificial knee joint: Secondary | ICD-10-CM | POA: Diagnosis not present

## 2017-01-20 DIAGNOSIS — Z471 Aftercare following joint replacement surgery: Secondary | ICD-10-CM | POA: Diagnosis not present

## 2017-01-20 DIAGNOSIS — M25561 Pain in right knee: Secondary | ICD-10-CM | POA: Diagnosis not present

## 2017-01-20 LAB — PSA, TOTAL AND FREE
PSA FREE: 0.01 ng/mL
PSA, Free Pct: >10 %
Prostate Specific Ag, Serum: <0.1 ng/mL (ref 0.0–4.0)

## 2017-01-27 ENCOUNTER — Ambulatory Visit (INDEPENDENT_AMBULATORY_CARE_PROVIDER_SITE_OTHER): Payer: Medicare Other | Admitting: Urology

## 2017-01-27 DIAGNOSIS — Z8546 Personal history of malignant neoplasm of prostate: Secondary | ICD-10-CM

## 2017-01-27 DIAGNOSIS — R350 Frequency of micturition: Secondary | ICD-10-CM | POA: Diagnosis not present

## 2017-01-27 DIAGNOSIS — N3941 Urge incontinence: Secondary | ICD-10-CM

## 2017-02-08 ENCOUNTER — Telehealth: Payer: Self-pay | Admitting: Family Medicine

## 2017-02-08 NOTE — Telephone Encounter (Signed)
Attempted to contact patient - line busy x 2. Looks like patient should have strips at the pharmacy.

## 2017-02-14 MED ORDER — BLOOD GLUCOSE MONITOR KIT: PACK | 0 refills | Status: DC

## 2017-02-14 NOTE — Telephone Encounter (Signed)
Patient informed that prescription for new blood sugar meter kit was sent to [facility]

## 2017-04-06 ENCOUNTER — Ambulatory Visit: Payer: Medicare Other | Admitting: Family Medicine

## 2017-04-07 ENCOUNTER — Telehealth: Payer: Self-pay | Admitting: Family Medicine

## 2017-04-07 ENCOUNTER — Encounter: Payer: Self-pay | Admitting: Family Medicine

## 2017-04-07 NOTE — Telephone Encounter (Signed)
NA 8/24-jhb

## 2017-04-09 IMAGING — CR DG CHEST 2V
2 series · 2 of 2 positions shown · non-contrast
Comparison: None.

CLINICAL DATA: Shortness of breath for 2 days.  Diabetes.

EXAM:
CHEST - 2 VIEW

[chest pa]
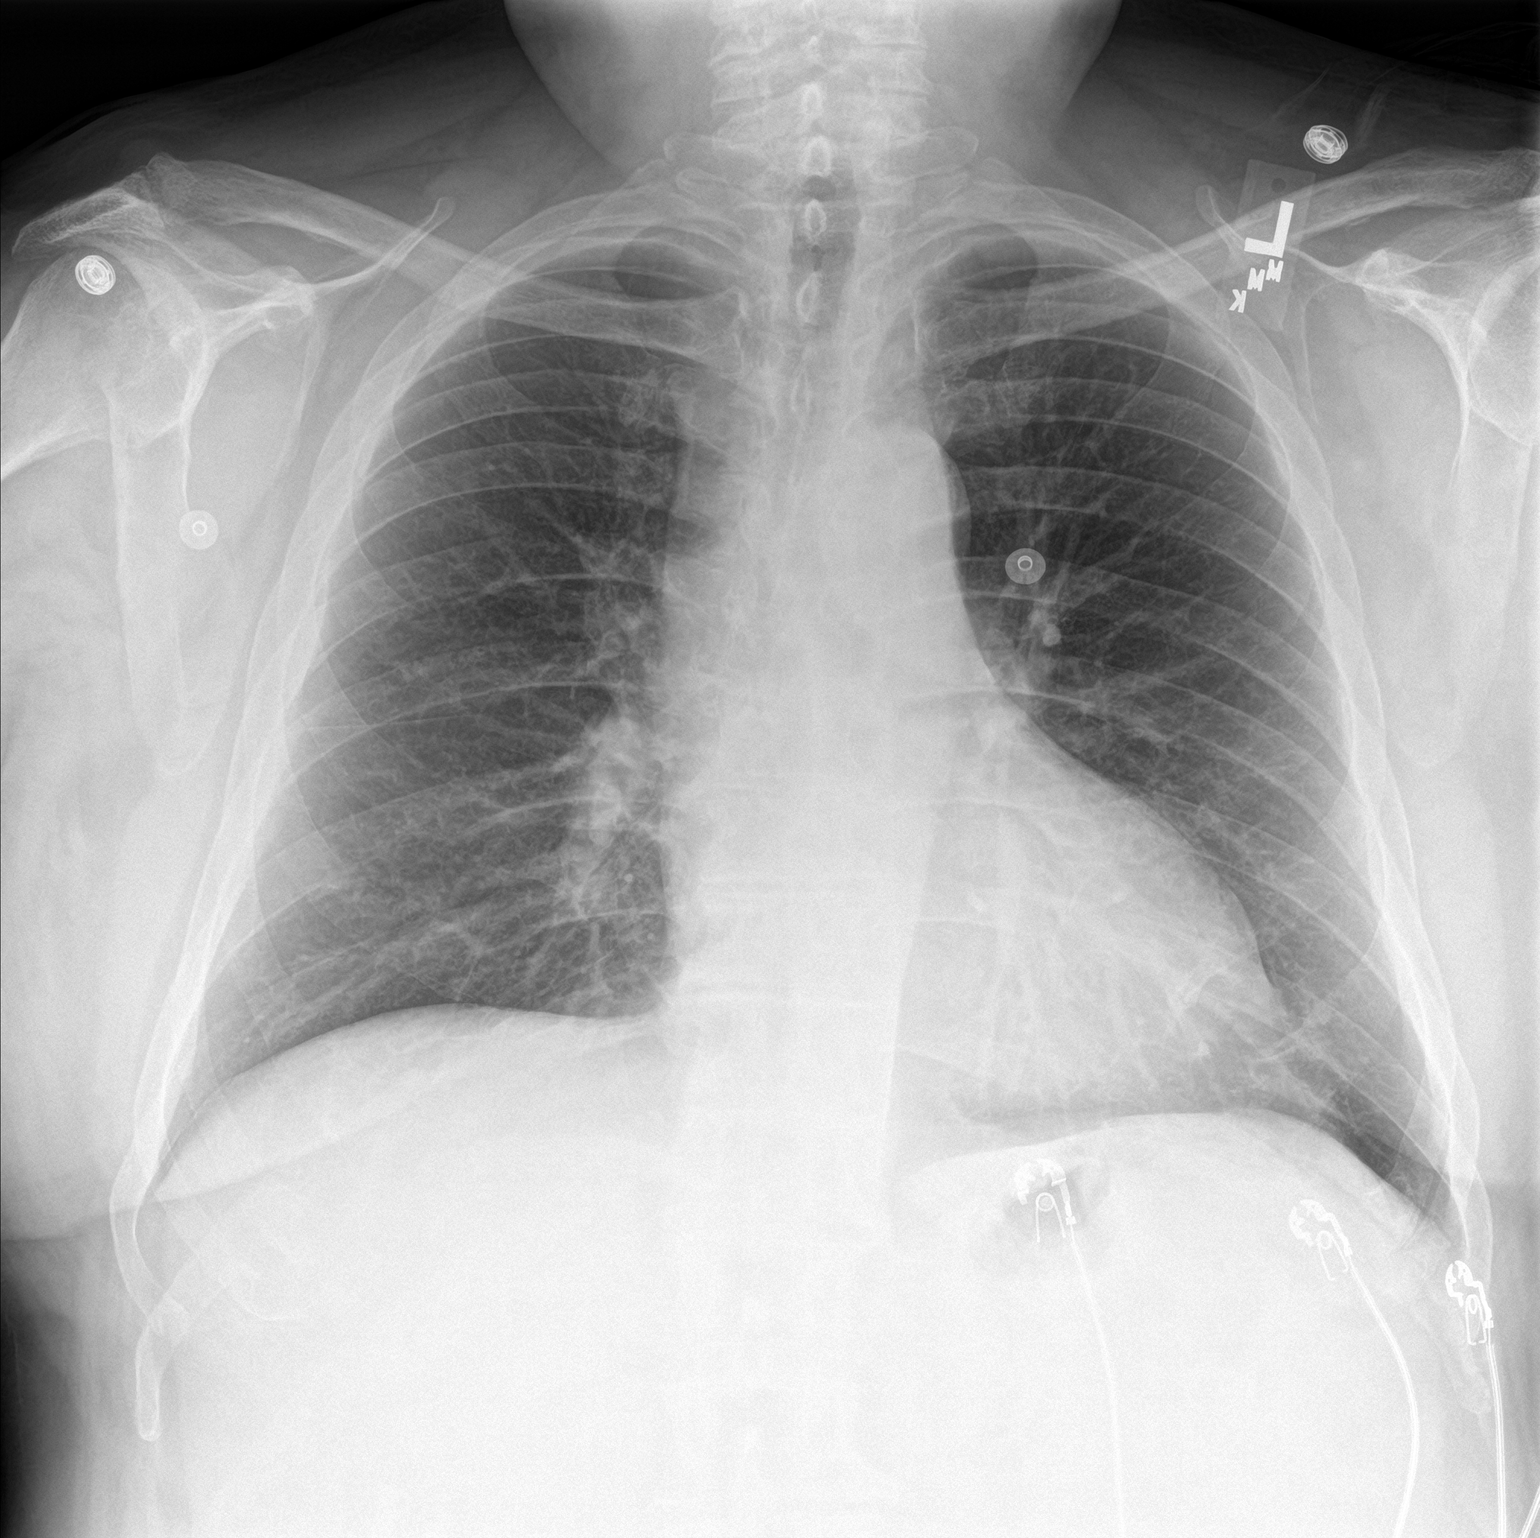

[chest lat]
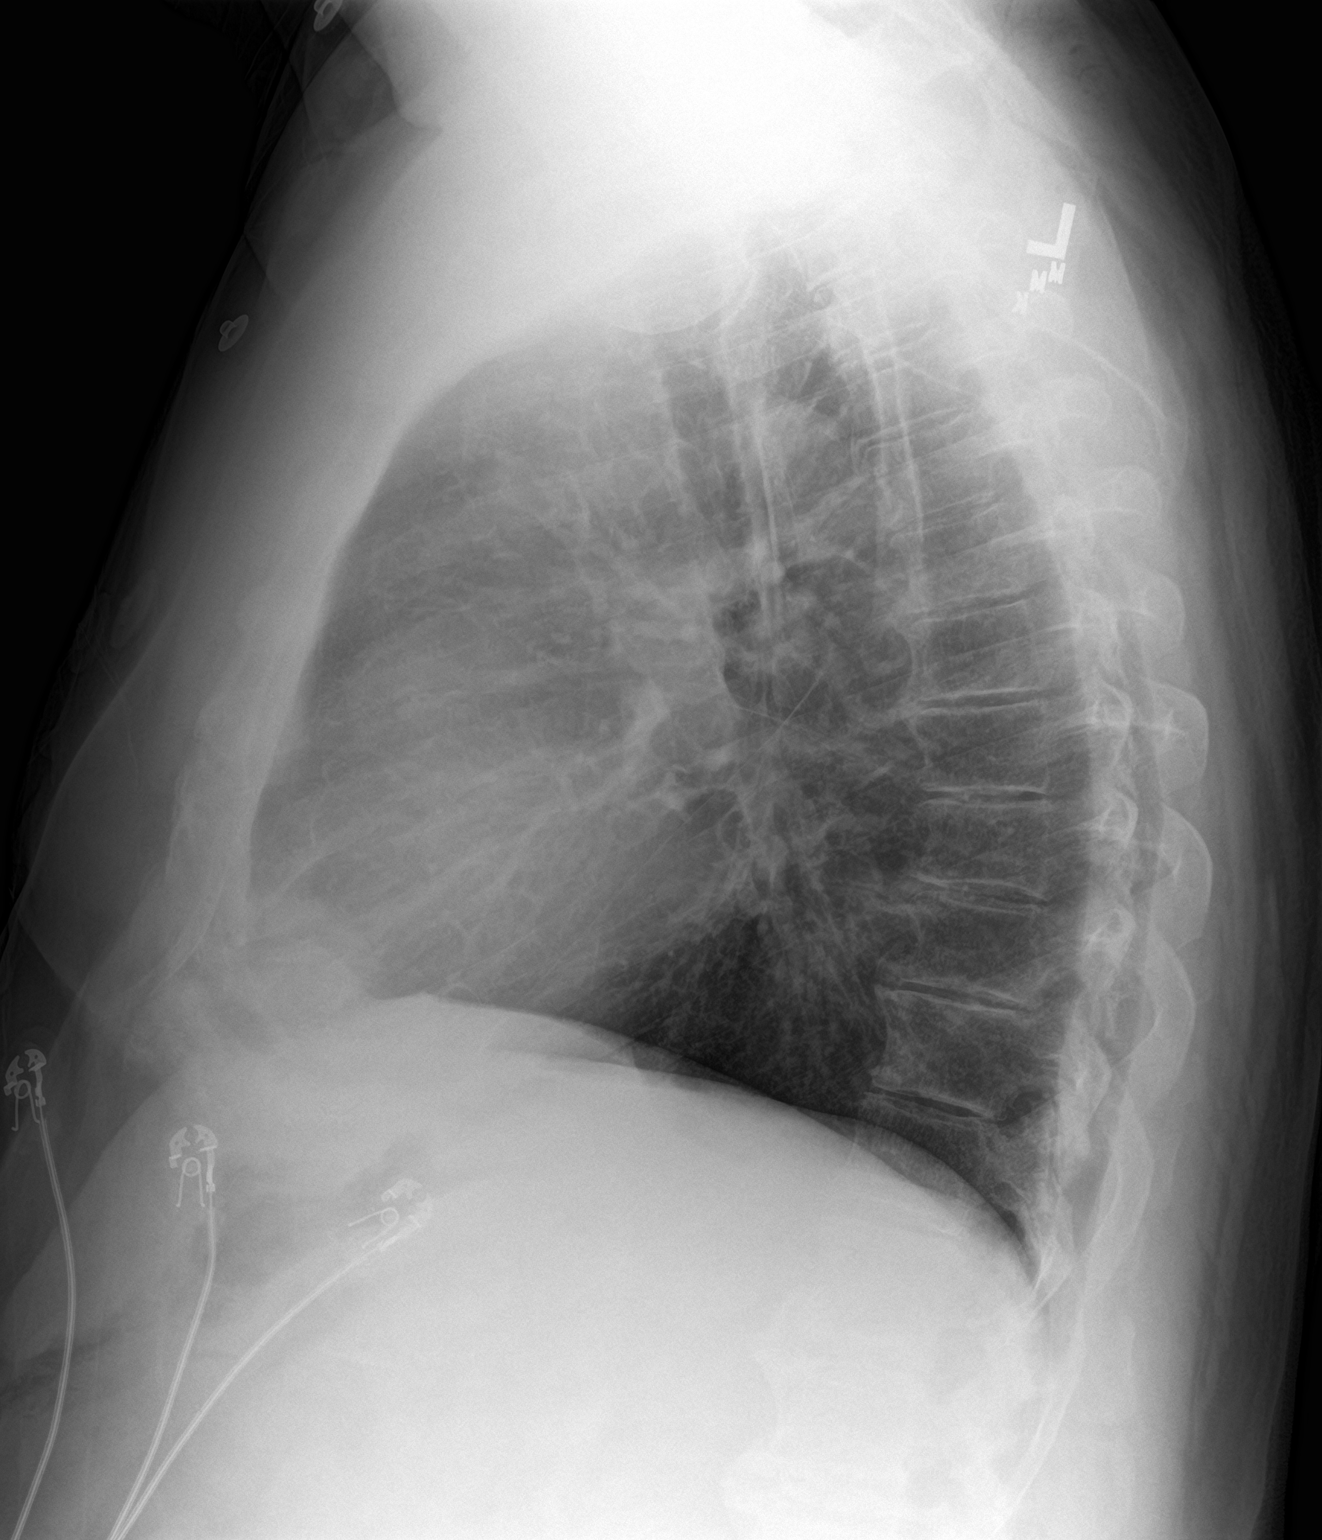

[2 of 2 positions shown; findings below may reference images not displayed]

FINDINGS: The heart size is normal. The lungs are clear. Mild chronic
interstitial coarsening is evident. Degenerative changes are noted
in the thoracolumbar spine. The visualized soft tissues and bony
thorax are otherwise unremarkable.
IMPRESSION: No active disease.

## 2017-04-24 ENCOUNTER — Other Ambulatory Visit: Payer: Self-pay | Admitting: Family Medicine

## 2017-04-25 ENCOUNTER — Other Ambulatory Visit: Payer: Self-pay | Admitting: *Deleted

## 2017-04-25 MED ORDER — AMLODIPINE BESYLATE-VALSARTAN 10-320 MG PO TABS: ORAL_TABLET | ORAL | 3 refills | Status: DC

## 2017-04-27 MED ORDER — AMLODIPINE-OLMESARTAN 5-40 MG PO TABS: 1.0000 | ORAL_TABLET | Freq: Every day | ORAL | 2 refills | Status: DC

## 2017-04-27 NOTE — Telephone Encounter (Signed)
Please advise medication change for amlodipine-valsartan 10-320 Patient uses [facility] [place]

## 2017-04-27 NOTE — Telephone Encounter (Signed)
Pt aware.

## 2017-04-27 NOTE — Telephone Encounter (Signed)
Changed exforge to azor

## 2017-05-01 ENCOUNTER — Telehealth: Payer: Self-pay | Admitting: Family Medicine

## 2017-05-01 MED ORDER — AMLODIPINE BESYLATE 5 MG PO TABS: 5.0000 mg | ORAL_TABLET | Freq: Every day | ORAL | 3 refills | Status: DC

## 2017-05-01 MED ORDER — OLMESARTAN MEDOXOMIL 40 MG PO TABS: 40.0000 mg | ORAL_TABLET | Freq: Every day | ORAL | 3 refills | Status: DC

## 2017-05-01 NOTE — Telephone Encounter (Signed)
Patient's wife is aware of change in medication and will call us back if his insurance will not pay for meds.

## 2017-05-01 NOTE — Telephone Encounter (Signed)
Changed azor to amlodipine 5mg  daily and benicar 40mg  daily to see if insurance would pay for meds if split them up. Just make sure know are on 2 pills instead of one for blood presure

## 2017-05-01 NOTE — Telephone Encounter (Signed)
Spoke with wife, insurance will no longer cover his Azor 5-40 and he has no blood pressure medication.  Please advise.

## 2017-05-04 ENCOUNTER — Other Ambulatory Visit: Payer: Medicare Other

## 2017-05-04 DIAGNOSIS — I10 Essential (primary) hypertension: Secondary | ICD-10-CM

## 2017-05-04 DIAGNOSIS — E119 Type 2 diabetes mellitus without complications: Secondary | ICD-10-CM

## 2017-05-04 DIAGNOSIS — E559 Vitamin D deficiency, unspecified: Secondary | ICD-10-CM

## 2017-05-04 DIAGNOSIS — N183 Chronic kidney disease, stage 3 unspecified: Secondary | ICD-10-CM

## 2017-05-04 DIAGNOSIS — E7800 Pure hypercholesterolemia, unspecified: Secondary | ICD-10-CM

## 2017-05-04 DIAGNOSIS — C61 Malignant neoplasm of prostate: Secondary | ICD-10-CM

## 2017-05-04 DIAGNOSIS — R6889 Other general symptoms and signs: Secondary | ICD-10-CM | POA: Diagnosis not present

## 2017-05-04 LAB — BAYER DCA HB A1C WAIVED: HB A1C (BAYER DCA - WAIVED): 6.2 % (ref <7.0)

## 2017-05-05 LAB — BMP8+EGFR
BUN/Creatinine Ratio: 16 (ref 10–24)
BUN: 30 mg/dL — ABNORMAL HIGH (ref 8–27)
CALCIUM: 9.3 mg/dL (ref 8.6–10.2)
CO2: 21 mmol/L (ref 20–29)
Chloride: 104 mmol/L (ref 96–106)
Creatinine, Ser: 1.89 mg/dL — ABNORMAL HIGH (ref 0.76–1.27)
GFR calc non Af Amer: 34 mL/min/{1.73_m2} — ABNORMAL LOW (ref >59)
GFR, EST AFRICAN AMERICAN: 39 mL/min/{1.73_m2} — AB (ref >59)
Glucose: 116 mg/dL — ABNORMAL HIGH (ref 65–99)
Potassium: 4.8 mmol/L (ref 3.5–5.2)
SODIUM: 138 mmol/L (ref 134–144)

## 2017-05-05 LAB — VITAMIN D 25 HYDROXY (VIT D DEFICIENCY, FRACTURES): Vit D, 25-Hydroxy: 35 ng/mL (ref 30.0–100.0)

## 2017-05-05 LAB — LIPID PANEL
Chol/HDL Ratio: 3.8 ratio (ref 0.0–5.0)
Cholesterol, Total: 124 mg/dL (ref 100–199)
HDL: 33 mg/dL — AB (ref >39)
LDL CALC: 61 mg/dL (ref 0–99)
TRIGLYCERIDES: 151 mg/dL — AB (ref 0–149)
VLDL Cholesterol Cal: 30 mg/dL (ref 5–40)

## 2017-05-05 LAB — CBC WITH DIFFERENTIAL/PLATELET
BASOS: 0 %
Basophils Absolute: 0 10*3/uL (ref 0.0–0.2)
EOS (ABSOLUTE): 0.1 10*3/uL (ref 0.0–0.4)
EOS: 2 %
HEMATOCRIT: 41.8 % (ref 37.5–51.0)
HEMOGLOBIN: 14.2 g/dL (ref 13.0–17.7)
IMMATURE GRANULOCYTES: 0 %
Immature Grans (Abs): 0 10*3/uL (ref 0.0–0.1)
LYMPHS ABS: 2.1 10*3/uL (ref 0.7–3.1)
Lymphs: 28 %
MCH: 29 pg (ref 26.6–33.0)
MCHC: 34 g/dL (ref 31.5–35.7)
MCV: 85 fL (ref 79–97)
MONOCYTES: 8 %
Monocytes Absolute: 0.6 10*3/uL (ref 0.1–0.9)
Neutrophils Absolute: 4.7 10*3/uL (ref 1.4–7.0)
Neutrophils: 62 %
Platelets: 174 10*3/uL (ref 150–379)
RBC: 4.9 x10E6/uL (ref 4.14–5.80)
RDW: 15.1 % (ref 12.3–15.4)
WBC: 7.6 10*3/uL (ref 3.4–10.8)

## 2017-05-05 LAB — HEPATIC FUNCTION PANEL
ALK PHOS: 68 IU/L (ref 39–117)
ALT: 8 IU/L (ref 0–44)
AST: 14 IU/L (ref 0–40)
Albumin: 4 g/dL (ref 3.5–4.8)
Bilirubin Total: 0.4 mg/dL (ref 0.0–1.2)
Bilirubin, Direct: 0.11 mg/dL (ref 0.00–0.40)
Total Protein: 6 g/dL (ref 6.0–8.5)

## 2017-05-09 ENCOUNTER — Ambulatory Visit (INDEPENDENT_AMBULATORY_CARE_PROVIDER_SITE_OTHER): Payer: Medicare Other | Admitting: Family Medicine

## 2017-05-09 ENCOUNTER — Encounter: Payer: Self-pay | Admitting: Family Medicine

## 2017-05-09 VITALS — BP 130/67 | HR 82 | Temp 98.3°F | Ht 67.0 in | Wt 242.0 lb

## 2017-05-09 DIAGNOSIS — C61 Malignant neoplasm of prostate: Secondary | ICD-10-CM | POA: Diagnosis not present

## 2017-05-09 DIAGNOSIS — E7800 Pure hypercholesterolemia, unspecified: Secondary | ICD-10-CM | POA: Diagnosis not present

## 2017-05-09 DIAGNOSIS — C439 Malignant melanoma of skin, unspecified: Secondary | ICD-10-CM | POA: Diagnosis not present

## 2017-05-09 DIAGNOSIS — I70209 Unspecified atherosclerosis of native arteries of extremities, unspecified extremity: Secondary | ICD-10-CM

## 2017-05-09 DIAGNOSIS — M62838 Other muscle spasm: Secondary | ICD-10-CM | POA: Diagnosis not present

## 2017-05-09 DIAGNOSIS — N183 Chronic kidney disease, stage 3 unspecified: Secondary | ICD-10-CM

## 2017-05-09 DIAGNOSIS — E119 Type 2 diabetes mellitus without complications: Secondary | ICD-10-CM

## 2017-05-09 DIAGNOSIS — I10 Essential (primary) hypertension: Secondary | ICD-10-CM

## 2017-05-09 DIAGNOSIS — E559 Vitamin D deficiency, unspecified: Secondary | ICD-10-CM | POA: Diagnosis not present

## 2017-05-09 DIAGNOSIS — I739 Peripheral vascular disease, unspecified: Secondary | ICD-10-CM | POA: Diagnosis not present

## 2017-05-09 DIAGNOSIS — E1159 Type 2 diabetes mellitus with other circulatory complications: Secondary | ICD-10-CM

## 2017-05-09 DIAGNOSIS — E1151 Type 2 diabetes mellitus with diabetic peripheral angiopathy without gangrene: Secondary | ICD-10-CM

## 2017-05-09 NOTE — Progress Notes (Signed)
Subjective:    Patient ID: Ricardo Huguenin., male    DOB: Nov 28, 1940, 76 y.o.   MRN: [id number]  HPI Pt here for follow up and management of chronic medical problems which includes hyperlipidemia and hypertension. He is taking medication regularly.The patient is doing well overall but does have jerks with his muscles at times and his wife wanted him to bring that problem. He has no other complaints. He is diabetic we will check his feet to get a period his weight is 242 pounds. He is due to return an FOBT and we will review his lab work with him that has already been done. He did get his flu shot at work. The hemoglobin A1c this time was 6.2%. The blood sugars elevated at 116. The creatinine was slightly more elevated at 1.89 and he does see the nephrologist. All the electrolytes including potassium are good. The white blood cell count was good and the hemoglobin was stable at 14.2 with an adequate platelet count. Cholesterol numbers with traditional lipid testing have an LDL C cholesterol that is good at 61 and triglycerides are slightly elevated at 151. The good cholesterol remains low. The vitamin D level is improved this time at 35. He will continue with current treatment. All liver function tests are normal. This patient is followed regularly by the urologist because of prostate cancer and he is also has a history of melanoma. Dr. Posey Pronto follows his chronic kidney disease. The patient denies any chest pain or shortness of breath. He denies any trouble with his intestinal tract including nausea vomiting diarrhea blood in the stool or black tarry bowel movements. He is passing his water well. He sees the urologist about every 6 months because of his prostate cancer. He goes to the dermatologist about every 3 months because of his melanoma. He says he's muscle jerks may occur once a day are as little as 3 times weekly. He is not sure what precipitates these. He does drink a couple cups of caffeine daily. He  did not get a thyroid profile this time more B-12 levels we will add these 2 tests to the blood that has already been drawn he currently does not take anything for his blood sugar. His fasting blood sugars run between 136 and 142. He still is working for the same [employer] he works as much is 35 hours per week. The patient got his flu shot at work.     Patient Active Problem List   Diagnosis Date Noted  . Pure hypercholesterolemia 07/13/2016  . Essential hypertension 07/13/2016  . Vitamin D deficiency 07/13/2016  . Morbid obesity due to excess calories ([name]) 05/27/2016  . Anemia of chronic disease   . LOC (loss of consciousness) ([place]) 04/08/2016  . TIA (transient ischemic attack) 04/08/2016  . 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 S/P knee replacement 03/21/2016  . Anemia associated with chronic renal failure 10/28/2015  . Hyperparathyroidism , secondary, non-renal ([place]) 10/28/2015  . Pulmonary emboli ([place]) 08/27/2015  . Near syncope 08/25/2015  . DOE (dyspnea on exertion) 06/02/2015  . Controlled type 2 diabetes mellitus ([place]) 06/02/2015  . Melanoma, history of 10/09/2013  . 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 Chronic kidney disease, stage III (moderate) 10/09/2013  . Venous (peripheral) insufficiency 10/09/2013  . Statin intolerance 10/09/2013  . Prostate cancer ([place]) 12/25/2012   Outpatient Encounter Prescriptions as of 05/09/2017  Medication Sig  . amLODipine (NORVASC) 5 MG tablet Take 1  tablet (5 mg total) by mouth daily.  . blood glucose meter kit and supplies KIT Dispense based on patient and insurance preference. Use up to four times daily as directed. (FOR ICD-9 250.00, 250.01).  . fenofibrate 160 MG tablet TAKE 1 TABLET BY MOUTH DAILY (Patient taking differently: TAKE 1 TABLET (160 mg) BY MOUTH DAILY)  . furosemide (LASIX) 20 MG tablet Take 20 mg by mouth daily as needed.  Marland Kitchen glucose blood (ONE TOUCH ULTRA TEST) test strip Test up to  tid. DX E11.9  . olmesartan (BENICAR) 40 MG tablet Take 1 tablet (40 mg total) by mouth daily.  . rivaroxaban (XARELTO) 20 MG TABS tablet Take one tablet by mouth daily with food  . vitamin B-12 (CYANOCOBALAMIN) 1000 MCG tablet Take 1,000 mcg by mouth daily.  . [DISCONTINUED] cephALEXin (KEFLEX) 500 MG capsule Take 1 capsule (500 mg total) by mouth 3 (three) times daily.   No facility-administered encounter medications on file as of 05/09/2017.       Review of Systems  Constitutional: Negative.   HENT: Negative.   Eyes: Negative.   Respiratory: Negative.   Cardiovascular: Negative.   Gastrointestinal: Negative.   Endocrine: Negative.   Genitourinary: Negative.   Musculoskeletal: Positive for myalgias (muscle "jerks" at times).  Skin: Negative.   Allergic/Immunologic: Negative.   Neurological: Negative.   Hematological: Negative.   Psychiatric/Behavioral: Negative.        Objective:   Physical Exam  Constitutional: He is oriented to person, place, and time. He appears well-developed and well-nourished. No distress.  The patient is pleasant alert and calm.  HENT:  Head: Normocephalic and atraumatic.  Right Ear: External ear normal.  Left Ear: External ear normal.  Nose: Nose normal.  Mouth/Throat: Oropharynx is clear and moist. No oropharyngeal exudate.  Eyes: Pupils are equal, round, and reactive to light. Conjunctivae and EOM are normal. Right eye exhibits no discharge. Left eye exhibits no discharge. No scleral icterus.  The patient gets his eyes examined yearly.  Neck: Normal range of motion. Neck supple. No thyromegaly present.  No bruits thyromegaly or anterior cervical adenopathy  Cardiovascular: Normal rate, regular rhythm and normal heart sounds.   No murmur heard. Diminished pulses on the left foot compared to the right Heart is regular at 72/m  Pulmonary/Chest: Effort normal and breath sounds normal. No respiratory distress. He has no wheezes. He has no rales. He  exhibits no tenderness.  Abdominal: Soft. Bowel sounds are normal. He exhibits no mass. There is no tenderness. There is no rebound and no guarding.  Abdominal obesity without masses tenderness or organ enlargement or bruits  Genitourinary:  Genitourinary Comments: Followed regularly by the urologist every 6 months  Musculoskeletal: Normal range of motion. He exhibits edema. He exhibits no tenderness.  Decreased full movement of right knee joint  Lymphadenopathy:    He has no cervical adenopathy.  Neurological: He is alert and oriented to person, place, and time. He has normal reflexes. No cranial nerve deficit.  Skin: Skin is warm and dry. No rash noted.  Psychiatric: He has a normal mood and affect. His behavior is normal. Judgment and thought content normal.  Nursing note and vitals reviewed.  BP 130/67 (BP Location: Left Arm)   Pulse 82   Temp 98.3 F (36.8 C) (Oral)   Ht '5\' 7"'  (1.702 m)   Wt 242 lb (109.8 kg)   BMI 37.90 kg/m        Assessment & Plan:  1. Chronic kidney disease, stage  III (moderate) -Continue to follow-up with Dr. Posey Pronto  2. Controlled type 2 diabetes mellitus without complication, without long-term current use of insulin ([place]) -Continue with diet and exercise regimen and we'll consider starting medication if any further rise in A1c  3. Essential hypertension -Blood pressure is good today and he will continue with current treatment  4. Pure hypercholesterolemia -Patient has been statin intolerant and does take fenofibrate for his triglycerides which are elevated on this blood draw.  5. Vitamin D deficiency -Continue current vitamin D replacement which is every other day and discuss this with Dr. Posey Pronto at his next visit  6. Muscle spasm -Add B12 level, and thyroid profile, and reduce caffeine to no more than one cup daily  7. Prostate cancer [facility]) -Follow-up with urology as planned  8. Melanoma of skin ([place]) -Follow-up with dermatology as  planned  9. Type 2 diabetes mellitus with vascular disease ([place]) -Patient should let us know if walking distance diminishes secondary to pain and left leg  10. Morbid obesity ([place]) -Continue to work aggressively on weight loss through diet and exercise  11. Peripheral vascular insufficiency ([facility]) -Wear good support hose bilaterally and continue to walk and exercise. Patient has diminished pulses in left foot.  Patient Instructions                       Medicare Annual Wellness Visit  [facility] and the medical providers at [facility] strive to bring you the best medical care.  In doing so we not only want to address your current medical conditions and concerns but also to detect new conditions early and prevent illness, disease and health-related problems.    Medicare offers a yearly Wellness Visit which allows our clinical staff to assess your need for preventative services including immunizations, lifestyle education, counseling to decrease risk of preventable diseases and screening for fall risk and other medical concerns.    This visit is provided free of charge (no copay) for all Medicare recipients. The clinical pharmacists at [facility] have begun to conduct these Wellness Visits which will also include a thorough review of all your medications.    As you primary medical provider recommend that you make an appointment for your Annual Wellness Visit if you have not done so already this year.  You may set up this appointment before you leave today or you may call back ([phone]) and schedule an appointment.  Please make sure when you call that you mention that you are scheduling your Annual Wellness Visit with the clinical pharmacist so that the appointment may be made for the proper length of time.     Continue current medications. Continue good therapeutic lifestyle changes which include good diet and exercise. Fall precautions  discussed with patient. If an FOBT was given today- please return it to our front desk. If you are over 59 years old - you may need Prevnar 57 or the adult Pneumonia vaccine.  **Flu shots are available--- please call and schedule a FLU-CLINIC appointment**  After your visit with [name] today you will receive a survey in the mail or online from [employer] regarding your care with [name]. Please take a moment to fill this out. Your feedback is very important to [name] as you can help [name] better understand your patient needs as well as improve your experience and satisfaction. WE CARE ABOUT YOU!!!   Continue to follow-up with urology, dermatology, and nephrology. Discuss with the  nephrologist about the best medication he would recommend for your diabetes if we have to start you on something for this. Continue to stay active and drink plenty of fluids Reduce caffeine to one cup daily as requested Get back in touch with [name] in 2-3 weeks regarding these jerking spells that you're having at home    Arrie Senate MD

## 2017-05-09 NOTE — Patient Instructions (Addendum)
Medicare Annual Wellness Visit  [facility] and the medical providers at [facility] strive to bring you the best medical care.  In doing so we not only want to address your current medical conditions and concerns but also to detect new conditions early and prevent illness, disease and health-related problems.    Medicare offers a yearly Wellness Visit which allows our clinical staff to assess your need for preventative services including immunizations, lifestyle education, counseling to decrease risk of preventable diseases and screening for fall risk and other medical concerns.    This visit is provided free of charge (no copay) for all Medicare recipients. The clinical pharmacists at [facility] have begun to conduct these Wellness Visits which will also include a thorough review of all your medications.    As you primary medical provider recommend that you make an appointment for your Annual Wellness Visit if you have not done so already this year.  You may set up this appointment before you leave today or you may call back ([phone]) and schedule an appointment.  Please make sure when you call that you mention that you are scheduling your Annual Wellness Visit with the clinical pharmacist so that the appointment may be made for the proper length of time.     Continue current medications. Continue good therapeutic lifestyle changes which include good diet and exercise. Fall precautions discussed with patient. If an FOBT was given today- please return it to our front desk. If you are over 66 years old - you may need Prevnar 71 or the adult Pneumonia vaccine.  **Flu shots are available--- please call and schedule a FLU-CLINIC appointment**  After your visit with [name] today you will receive a survey in the mail or online from [employer] regarding your care with [name]. Please take a moment to fill this out. Your feedback is very  important to [name] as you can help [name] better understand your patient needs as well as improve your experience and satisfaction. WE CARE ABOUT YOU!!!   Continue to follow-up with urology, dermatology, and nephrology. Discuss with the nephrologist about the best medication he would recommend for your diabetes if we have to start you on something for this. Continue to stay active and drink plenty of fluids Reduce caffeine to one cup daily as requested Get back in touch with [name] in 2-3 weeks regarding these jerking spells that you're having at home

## 2017-05-10 LAB — THYROID PANEL WITH TSH
FREE THYROXINE INDEX: 1.7 (ref 1.2–4.9)
T3 UPTAKE RATIO: 28 % (ref 24–39)
T4, Total: 6.1 ug/dL (ref 4.5–12.0)
TSH: 2.45 u[IU]/mL (ref 0.450–4.500)

## 2017-05-10 LAB — SPECIMEN STATUS REPORT

## 2017-05-10 LAB — VITAMIN B12: Vitamin B-12: 496 pg/mL (ref 232–1245)

## 2017-06-02 ENCOUNTER — Other Ambulatory Visit: Payer: Self-pay

## 2017-06-02 MED ORDER — OLMESARTAN MEDOXOMIL 40 MG PO TABS: 40.0000 mg | ORAL_TABLET | Freq: Every day | ORAL | 0 refills | Status: DC

## 2017-07-03 ENCOUNTER — Encounter: Payer: Self-pay | Admitting: Physician Assistant

## 2017-07-03 ENCOUNTER — Ambulatory Visit (INDEPENDENT_AMBULATORY_CARE_PROVIDER_SITE_OTHER): Payer: Medicare Other | Admitting: Physician Assistant

## 2017-07-03 VITALS — BP 130/72 | HR 90 | Temp 97.4°F | Ht 67.0 in | Wt 249.0 lb

## 2017-07-03 DIAGNOSIS — R51 Headache: Secondary | ICD-10-CM | POA: Diagnosis not present

## 2017-07-03 DIAGNOSIS — B349 Viral infection, unspecified: Secondary | ICD-10-CM | POA: Diagnosis not present

## 2017-07-03 DIAGNOSIS — R635 Abnormal weight gain: Secondary | ICD-10-CM

## 2017-07-03 DIAGNOSIS — R600 Localized edema: Secondary | ICD-10-CM

## 2017-07-03 DIAGNOSIS — R509 Fever, unspecified: Secondary | ICD-10-CM

## 2017-07-03 DIAGNOSIS — R519 Headache, unspecified: Secondary | ICD-10-CM

## 2017-07-03 DIAGNOSIS — R5383 Other fatigue: Secondary | ICD-10-CM | POA: Diagnosis not present

## 2017-07-03 LAB — VERITOR FLU A/B WAIVED
INFLUENZA A: NEGATIVE
Influenza B: NEGATIVE

## 2017-07-03 MED ORDER — FUROSEMIDE 20 MG PO TABS: 20.0000 mg | ORAL_TABLET | Freq: Every day | ORAL | 2 refills | Status: DC | PRN

## 2017-07-03 NOTE — Progress Notes (Signed)
BP 130/72   Pulse 90   Temp (!) 97.4 F (36.3 C) (Oral)   Ht _0  (1.702 m)   Wt 249 lb (112.9 kg)   BMI 39.00 kg/m    Subjective:    Patient ID: Ricardo Mayer., male    DOB: 03-Feb-1941, 76 y.o.   MRN: [id number]  HPI: Ricardo Mayer. is a 76 y.o. male presenting on 07/03/2017 for Edema (feet swelling ); Chills; Fatigue; and Nausea  Patient comes in with 2-day history of severe fatigue, chills and nausea.  He did receive a flu shot.  He does not know of anyone around him that has had an active flu infection.  He feels like he does when he has had the flu in the past.  He states that he just wants to sit by the heater.  He cannot get himself warm.  He has had some sore throat, mild cough.  He has not had any vomiting or diarrhea.  He also is up a few pounds since September and he states that his lower legs are slightly more swollen than normal.  He has had this is a chronic problem.  He does see cardiology.  Current dose is Lasix 20 mg 1 daily.  Relevant past medical, surgical, family and social history reviewed and updated as indicated. Allergies and medications reviewed and updated.  Past Medical History:  Diagnosis Date  . Adenomatous polyp of colon   . Arthritis    bilateral knees  . CKD (chronic kidney disease), stage II    Dr. Patel,nephrology "stable last 3 yrs"  . Diabetes mellitus without complication ([place])    Dr. Laurance Flatten.PCP  . History of basal cell cancer   . History of DVT of lower extremity    2011--  LEFT LEG  . History of kidney stones   . History of melanoma excision    RIGHT ARM  . Hyperlipidemia   . Hypertension   . Impaired hearing    hearing aids bilateral  . Melanoma ([place]) 1990   arm and back -Melanoma, basal cell others- Dr. Edgardo Roys follows.  . Near syncope 08/25/2015   Dr. Roland Rack once"found blood clots in lung"  . Nocturia   . Prostate cancer [facility]) 2014   Dr. Cherylann Parr- "seed implant" '14  . Ulcer of left lower leg ([place])    recent pcp visit dr Laurance Flatten--  ordered betadine wet/dry dsg daily (03-18-2013)  [place] IN 1 WEEK    Past Surgical History:  Procedure Laterality Date  . KIDNEY STONE EXTRACTIONS  LAST ONE 1970   X2 OPEN/  X1  URETEROSCOPY  . MELANOMA EXCISION RIGHT ARM  1990  . PROSTATE BIOPSY    . RADIOACTIVE SEED IMPLANT N/A 03/21/2013   Performed by Irine Seal, MD at [facility]  . RIGHT TOTAL KNEE ARTHROPLASTY Right 03/21/2016   Performed by Paralee Cancel, MD at [facility]  . TONSILLECTOMY      Review of Systems  Constitutional: Positive for activity change and fatigue. Negative for appetite change and fever.  HENT: Positive for congestion and sore throat. Negative for sinus pressure.   Eyes: Negative.  Negative for pain and visual disturbance.  Respiratory: Negative for cough, chest tightness, shortness of breath and wheezing.   Cardiovascular: Positive for leg swelling. Negative for chest pain and palpitations.  Gastrointestinal: Positive for nausea. Negative for abdominal pain, diarrhea and vomiting.  Endocrine: Negative.   Genitourinary: Negative.   Musculoskeletal: Positive  for back pain and myalgias. Negative for arthralgias.  Skin: Negative.  Negative for color change and rash.  Neurological: Positive for headaches. Negative for weakness and numbness.  Psychiatric/Behavioral: Negative.     Allergies as of 07/03/2017   No Known Allergies     Medication List        Accurate as of 07/03/17  9:03 AM. Always use your most recent med list.          amLODipine 5 MG tablet Commonly known as:  NORVASC Take 1 tablet (5 mg total) by mouth daily.   blood glucose meter kit and supplies Kit Dispense based on patient and insurance preference. Use up to four times daily as directed. (FOR ICD-9 250.00, 250.01).   fenofibrate 160 MG tablet TAKE 1 TABLET BY MOUTH DAILY   furosemide 20 MG tablet Commonly known as:  LASIX Take 20 mg by mouth daily as  needed.   glucose blood test strip Commonly known as:  ONE TOUCH ULTRA TEST Test up to tid. DX E11.9   olmesartan 40 MG tablet Commonly known as:  BENICAR Take 1 tablet (40 mg total) by mouth daily.   rivaroxaban 20 MG Tabs tablet Commonly known as:  XARELTO Take one tablet by mouth daily with food   vitamin B-12 1000 MCG tablet Commonly known as:  CYANOCOBALAMIN Take 1,000 mcg by mouth daily.          Objective:    BP 130/72   Pulse 90   Temp (!) 97.4 F (36.3 C) (Oral)   Ht _0  (1.702 m)   Wt 249 lb (112.9 kg)   BMI 39.00 kg/m   No Known Allergies  Physical Exam  Constitutional: He is oriented to person, place, and time. He appears well-developed and well-nourished. He appears distressed.  HENT:  Head: Normocephalic and atraumatic.  Right Ear: Tympanic membrane normal. No drainage. No middle ear effusion.  Left Ear: Tympanic membrane normal. No drainage.  No middle ear effusion.  Nose: Mucosal edema and rhinorrhea present. Right sinus exhibits no maxillary sinus tenderness. Left sinus exhibits no maxillary sinus tenderness.  Mouth/Throat: Uvula is midline. Posterior oropharyngeal erythema present. No oropharyngeal exudate.  Eyes: Conjunctivae and EOM are normal. Pupils are equal, round, and reactive to light. Right eye exhibits no discharge. Left eye exhibits no discharge.  Neck: Normal range of motion.  Cardiovascular: Normal rate, regular rhythm, S1 normal, S2 normal and normal heart sounds.  No murmur heard. 1+ pitting edema in the right lower leg Trace edema in left lower leg Compression stockings in place  Pulmonary/Chest: Effort normal and breath sounds normal. No respiratory distress. He has no wheezes.  Abdominal: Soft.  Lymphadenopathy:    He has no cervical adenopathy.  Neurological: He is alert and oriented to person, place, and time.  Skin: Skin is warm and dry.  Psychiatric: He has a normal mood and affect. His behavior is normal.  Nursing note  and vitals reviewed.       Assessment & Plan:   1. Fatigue, unspecified type - Veritor Flu A/B Waived  2. Chills with fever  3. Acute nonintractable headache, unspecified headache type  4. Weight gain Take LASIX daily  5. Localized edema TAKE LASIX daily  6. Viral illness Supportive care Tylenol Rest fluids   Current Outpatient Medications:  .  amLODipine (NORVASC) 5 MG tablet, Take 1 tablet (5 mg total) by mouth daily., Disp: 90 tablet, Rfl: 3 .  blood glucose meter kit and supplies KIT,  Dispense based on patient and insurance preference. Use up to four times daily as directed. (FOR ICD-9 250.00, 250.01)., Disp: 1 each, Rfl: 0 .  fenofibrate 160 MG tablet, TAKE 1 TABLET BY MOUTH DAILY (Patient taking differently: TAKE 1 TABLET (160 mg) BY MOUTH DAILY), Disp: 90 tablet, Rfl: 1 .  furosemide (LASIX) 20 MG tablet, Take 20 mg by mouth daily as needed., Disp: , Rfl:  .  glucose blood (ONE TOUCH ULTRA TEST) test strip, Test up to tid. DX E11.9, Disp: 100 each, Rfl: 2 .  olmesartan (BENICAR) 40 MG tablet, Take 1 tablet (40 mg total) by mouth daily., Disp: 90 tablet, Rfl: 0 .  rivaroxaban (XARELTO) 20 MG TABS tablet, Take one tablet by mouth daily with food, Disp: 30 tablet, Rfl: 11 .  vitamin B-12 (CYANOCOBALAMIN) 1000 MCG tablet, Take 1,000 mcg by mouth daily., Disp: , Rfl:  Continue all other maintenance medications as listed above.  Follow up plan: Follow-up as needed or worsening of symptoms. Call office for any issues.  Educational handout given for [place] PA-C [facility] [address]  [place],  [address] [phone]   07/03/2017, 9:03 AM

## 2017-07-03 NOTE — Patient Instructions (Addendum)
In a few days you may receive a survey in the mail or online from [employer] regarding your visit with [name] today. Please take a moment to fill this out. Your feedback is very important to our whole office. It can help [name] better understand your needs as well as improve your experience and satisfaction. Thank you for taking your time to complete it. We care about you.  Particia Nearing, PA-C  - Take meds as prescribed - Use a cool mist humidifier  -Use saline nose sprays frequently -Saline irrigations of the nose can be very helpful if done frequently.  * 4X daily for 1 week*  * Use of a nettie pot can be helpful with this. Follow directions with this* -Force fluids -For any cough or congestion  Use plain Mucinex- regular strength or max strength is fine   * Children- consult with Pharmacist for dosing -For fever or aces or pains- take tylenol or ibuprofen appropriate for age and weight.  * for fevers greater than 101 orally you may alternate ibuprofen and tylenol every  3 hours. -Throat lozenges if help -New toothbrush in 3 days

## 2017-07-07 ENCOUNTER — Ambulatory Visit (INDEPENDENT_AMBULATORY_CARE_PROVIDER_SITE_OTHER): Payer: Medicare Other | Admitting: Family Medicine

## 2017-07-07 ENCOUNTER — Encounter: Payer: Self-pay | Admitting: Family Medicine

## 2017-07-07 VITALS — BP 123/64 | HR 77 | Temp 97.8°F | Ht 67.0 in | Wt 240.0 lb

## 2017-07-07 DIAGNOSIS — R600 Localized edema: Secondary | ICD-10-CM

## 2017-07-07 DIAGNOSIS — I70209 Unspecified atherosclerosis of native arteries of extremities, unspecified extremity: Secondary | ICD-10-CM

## 2017-07-07 DIAGNOSIS — E1151 Type 2 diabetes mellitus with diabetic peripheral angiopathy without gangrene: Secondary | ICD-10-CM

## 2017-07-07 MED ORDER — CEPHALEXIN 500 MG PO CAPS: 500.0000 mg | ORAL_CAPSULE | Freq: Two times a day (BID) | ORAL | 0 refills | Status: DC

## 2017-07-07 MED ORDER — TRIAMCINOLONE 0.1 % CREAM:EUCERIN CREAM 1:1: 1.0000 "application " | TOPICAL_CREAM | Freq: Two times a day (BID) | CUTANEOUS | 1 refills | Status: DC

## 2017-07-07 NOTE — Patient Instructions (Signed)
Continue Lasix adjusting dose as needed Wear compression stocking as tolerated

## 2017-07-07 NOTE — Progress Notes (Signed)
Subjective:    Patient ID: Ricardo Huguenin., male    DOB: 16-Nov-1940, 76 y.o.   MRN: [id number]  HPI Patient here today for right lower leg swelling and redness.  Patient has been seen earlier this week and tested for flu which was negative.  He still feels tired but is no longer having chills and rigors.  He has increased his Lasix and the swelling in the right lower leg has decreased.  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     Patient Active Problem List   Diagnosis Date Noted  . Pure hypercholesterolemia 07/13/2016  . Essential hypertension 07/13/2016  . Vitamin D deficiency 07/13/2016  . Morbid obesity ([place]) 05/27/2016  . Anemia of chronic disease   . LOC (loss of consciousness) ([place]) 04/08/2016  . TIA (transient ischemic attack) 04/08/2016  . 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 S/P knee replacement 03/21/2016  . Anemia associated with chronic renal failure 10/28/2015  . Hyperparathyroidism , secondary, non-renal ([place]) 10/28/2015  . Pulmonary emboli ([place]) 08/27/2015  . Near syncope 08/25/2015  . DOE (dyspnea on exertion) 06/02/2015  . Type 2 diabetes mellitus with vascular disease ([place]) 06/02/2015  . Melanoma of skin ([place]) 10/09/2013  . 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 Chronic kidney disease, stage III (moderate) ([place]) 10/09/2013  . Peripheral vascular insufficiency ([place]) 10/09/2013  . Statin intolerance 10/09/2013  . Prostate cancer ([place]) 12/25/2012   Outpatient Encounter Medications as of 07/07/2017  Medication Sig  . amLODipine (NORVASC) 5 MG tablet Take 1 tablet (5 mg total) by mouth daily.  . blood glucose meter kit and supplies KIT Dispense based on patient and insurance preference. Use up to four times daily as directed. (FOR ICD-9 250.00, 250.01).  . fenofibrate 160 MG tablet TAKE 1 TABLET BY MOUTH DAILY (Patient taking differently: TAKE 1 TABLET (160 mg) BY MOUTH DAILY)  . furosemide (LASIX)  20 MG tablet Take 1 tablet (20 mg total) daily as needed by mouth.  Marland Kitchen glucose blood (ONE TOUCH ULTRA TEST) test strip Test up to tid. DX E11.9  . olmesartan (BENICAR) 40 MG tablet Take 1 tablet (40 mg total) by mouth daily.  . rivaroxaban (XARELTO) 20 MG TABS tablet Take one tablet by mouth daily with food  . vitamin B-12 (CYANOCOBALAMIN) 1000 MCG tablet Take 1,000 mcg by mouth daily.   No facility-administered encounter medications on file as of 07/07/2017.       Review of Systems  Constitutional: Negative.   HENT: Negative.   Eyes: Negative.   Respiratory: Negative.   Cardiovascular: Positive for leg swelling.  Gastrointestinal: Negative.   Endocrine: Negative.   Genitourinary: Negative.   Musculoskeletal: Negative.   Skin: Positive for color change (right lower leg edema and redness).  Allergic/Immunologic: Negative.   Neurological: Negative.   Hematological: Negative.   Psychiatric/Behavioral: Negative.        Objective:   Physical Exam BP 123/64 (BP Location: Left Arm)   Pulse 77   Temp 97.8 F (36.6 C) (Oral)   Ht _0  (1.702 m)   Wt 240 lb (108.9 kg)   BMI 37.59 kg/m  Patient in no acute distress.  Denies shortness of breath. Chest-lungs are clear to auscultation Heart-regular without gallop Extremities there is 4+ edema right lower leg with erythema and increased temperature.  From history his leg does not look different than it usually does and in fact swelling has improved since increased  dose of Lasix.  He is concerned about possible infection.       Assessment & Plan:  #1 swelling right lower leg: Appearance is that of DVT but it is hard to imagine embolus given the fact that he is on Xarelto.  Multiple diagnoses stasis dermatitis and/or cellulitis. I would like to cover for both possibilities with triamcinolone cream to be applied 3 times a day and Keflex 500 mg twice daily for 5 days. 2.  Fatigue: Given increased dose of Lasix will check potassium but  also CBC to assess hemoglobin.

## 2017-07-08 LAB — CBC WITH DIFFERENTIAL/PLATELET
BASOS: 0 %
Basophils Absolute: 0 10*3/uL (ref 0.0–0.2)
EOS (ABSOLUTE): 0.1 10*3/uL (ref 0.0–0.4)
EOS: 1 %
HEMOGLOBIN: 13.2 g/dL (ref 13.0–17.7)
Hematocrit: 37.8 % (ref 37.5–51.0)
IMMATURE GRANS (ABS): 0.1 10*3/uL (ref 0.0–0.1)
IMMATURE GRANULOCYTES: 1 %
Lymphocytes Absolute: 1.7 10*3/uL (ref 0.7–3.1)
Lymphs: 15 %
MCH: 29.4 pg (ref 26.6–33.0)
MCHC: 34.9 g/dL (ref 31.5–35.7)
MCV: 84 fL (ref 79–97)
MONOCYTES: 8 %
Monocytes Absolute: 0.9 10*3/uL (ref 0.1–0.9)
NEUTROS PCT: 75 %
Neutrophils Absolute: 8.3 10*3/uL — ABNORMAL HIGH (ref 1.4–7.0)
PLATELETS: 243 10*3/uL (ref 150–379)
RBC: 4.49 x10E6/uL (ref 4.14–5.80)
RDW: 15.4 % (ref 12.3–15.4)
WBC: 11 10*3/uL — ABNORMAL HIGH (ref 3.4–10.8)

## 2017-07-08 LAB — BMP8+EGFR
BUN/Creatinine Ratio: 18 (ref 10–24)
BUN: 33 mg/dL — AB (ref 8–27)
CALCIUM: 9.1 mg/dL (ref 8.6–10.2)
CHLORIDE: 101 mmol/L (ref 96–106)
CO2: 22 mmol/L (ref 20–29)
CREATININE: 1.85 mg/dL — AB (ref 0.76–1.27)
GFR, EST AFRICAN AMERICAN: 40 mL/min/{1.73_m2} — AB (ref >59)
GFR, EST NON AFRICAN AMERICAN: 35 mL/min/{1.73_m2} — AB (ref >59)
Glucose: 149 mg/dL — ABNORMAL HIGH (ref 65–99)
Potassium: 4.6 mmol/L (ref 3.5–5.2)
Sodium: 139 mmol/L (ref 134–144)

## 2017-07-10 ENCOUNTER — Ambulatory Visit (HOSPITAL_COMMUNITY)
Admission: RE | Admit: 2017-07-10 | Discharge: 2017-07-10 | Disposition: A | Discharge status: Home / Self Care | Payer: Medicare Other | Source: Ambulatory Visit | Attending: Family Medicine | Admitting: Family Medicine

## 2017-07-10 ENCOUNTER — Encounter: Payer: Self-pay | Admitting: Family Medicine

## 2017-07-10 ENCOUNTER — Inpatient Hospital Stay (HOSPITAL_COMMUNITY)
Admission: EM | Admit: 2017-07-10 | Discharge: 2017-07-14 | DRG: 300 | Disposition: A | Discharge status: Home / Self Care | Payer: Medicare Other | Attending: Internal Medicine | Admitting: Internal Medicine

## 2017-07-10 ENCOUNTER — Encounter (HOSPITAL_COMMUNITY): Payer: Self-pay | Admitting: Emergency Medicine

## 2017-07-10 ENCOUNTER — Other Ambulatory Visit: Payer: Self-pay

## 2017-07-10 ENCOUNTER — Telehealth: Payer: Self-pay | Admitting: Family Medicine

## 2017-07-10 ENCOUNTER — Ambulatory Visit (INDEPENDENT_AMBULATORY_CARE_PROVIDER_SITE_OTHER): Payer: Medicare Other | Admitting: Family Medicine

## 2017-07-10 VITALS — BP 129/69 | HR 78 | Temp 97.0°F | Ht 67.0 in | Wt 241.0 lb

## 2017-07-10 DIAGNOSIS — E785 Hyperlipidemia, unspecified: Secondary | ICD-10-CM | POA: Diagnosis present

## 2017-07-10 DIAGNOSIS — I82411 Acute embolism and thrombosis of right femoral vein: Secondary | ICD-10-CM

## 2017-07-10 DIAGNOSIS — Z8546 Personal history of malignant neoplasm of prostate: Secondary | ICD-10-CM

## 2017-07-10 DIAGNOSIS — Z85820 Personal history of malignant melanoma of skin: Secondary | ICD-10-CM

## 2017-07-10 DIAGNOSIS — I70209 Unspecified atherosclerosis of native arteries of extremities, unspecified extremity: Secondary | ICD-10-CM

## 2017-07-10 DIAGNOSIS — E1122 Type 2 diabetes mellitus with diabetic chronic kidney disease: Secondary | ICD-10-CM | POA: Diagnosis present

## 2017-07-10 DIAGNOSIS — E1151 Type 2 diabetes mellitus with diabetic peripheral angiopathy without gangrene: Secondary | ICD-10-CM

## 2017-07-10 DIAGNOSIS — Z7901 Long term (current) use of anticoagulants: Secondary | ICD-10-CM | POA: Diagnosis not present

## 2017-07-10 DIAGNOSIS — Z86718 Personal history of other venous thrombosis and embolism: Secondary | ICD-10-CM

## 2017-07-10 DIAGNOSIS — I10 Essential (primary) hypertension: Secondary | ICD-10-CM

## 2017-07-10 DIAGNOSIS — L03115 Cellulitis of right lower limb: Secondary | ICD-10-CM | POA: Diagnosis present

## 2017-07-10 DIAGNOSIS — I129 Hypertensive chronic kidney disease with stage 1 through stage 4 chronic kidney disease, or unspecified chronic kidney disease: Secondary | ICD-10-CM | POA: Diagnosis present

## 2017-07-10 DIAGNOSIS — R600 Localized edema: Secondary | ICD-10-CM | POA: Diagnosis not present

## 2017-07-10 DIAGNOSIS — H9193 Unspecified hearing loss, bilateral: Secondary | ICD-10-CM | POA: Diagnosis present

## 2017-07-10 DIAGNOSIS — Z96651 Presence of right artificial knee joint: Secondary | ICD-10-CM | POA: Diagnosis present

## 2017-07-10 DIAGNOSIS — N182 Chronic kidney disease, stage 2 (mild): Secondary | ICD-10-CM | POA: Diagnosis present

## 2017-07-10 DIAGNOSIS — I82409 Acute embolism and thrombosis of unspecified deep veins of unspecified lower extremity: Secondary | ICD-10-CM | POA: Diagnosis present

## 2017-07-10 DIAGNOSIS — L0390 Cellulitis, unspecified: Secondary | ICD-10-CM | POA: Diagnosis present

## 2017-07-10 DIAGNOSIS — E1159 Type 2 diabetes mellitus with other circulatory complications: Secondary | ICD-10-CM | POA: Diagnosis present

## 2017-07-10 LAB — COMPREHENSIVE METABOLIC PANEL
ALT: 21 U/L (ref 17–63)
ANION GAP: 9 (ref 5–15)
AST: 20 U/L (ref 15–41)
Albumin: 3.3 g/dL — ABNORMAL LOW (ref 3.5–5.0)
Alkaline Phosphatase: 60 U/L (ref 38–126)
BUN: 28 mg/dL — ABNORMAL HIGH (ref 6–20)
CHLORIDE: 99 mmol/L — AB (ref 101–111)
CO2: 24 mmol/L (ref 22–32)
CREATININE: 1.69 mg/dL — AB (ref 0.61–1.24)
Calcium: 8.7 mg/dL — ABNORMAL LOW (ref 8.9–10.3)
GFR calc non Af Amer: 38 mL/min — ABNORMAL LOW (ref >60)
GFR, EST AFRICAN AMERICAN: 44 mL/min — AB (ref >60)
Glucose, Bld: 116 mg/dL — ABNORMAL HIGH (ref 65–99)
POTASSIUM: 4.3 mmol/L (ref 3.5–5.1)
SODIUM: 132 mmol/L — AB (ref 135–145)
Total Bilirubin: 0.6 mg/dL (ref 0.3–1.2)
Total Protein: 6.8 g/dL (ref 6.5–8.1)

## 2017-07-10 LAB — CBC WITH DIFFERENTIAL/PLATELET
BASOS ABS: 0 10*3/uL (ref 0.0–0.1)
Basophils Relative: 0 %
EOS ABS: 0.1 10*3/uL (ref 0.0–0.7)
EOS PCT: 2 %
HCT: 39.8 % (ref 39.0–52.0)
Hemoglobin: 12.6 g/dL — ABNORMAL LOW (ref 13.0–17.0)
LYMPHS ABS: 1.7 10*3/uL (ref 0.7–4.0)
Lymphocytes Relative: 22 %
MCH: 28.3 pg (ref 26.0–34.0)
MCHC: 31.7 g/dL (ref 30.0–36.0)
MCV: 89.2 fL (ref 78.0–100.0)
MONO ABS: 0.5 10*3/uL (ref 0.1–1.0)
Monocytes Relative: 7 %
Neutro Abs: 5.5 10*3/uL (ref 1.7–7.7)
Neutrophils Relative %: 69 %
PLATELETS: 313 10*3/uL (ref 150–400)
RBC: 4.46 MIL/uL (ref 4.22–5.81)
RDW: 14.1 % (ref 11.5–15.5)
WBC: 7.9 10*3/uL (ref 4.0–10.5)

## 2017-07-10 LAB — HEPARIN LEVEL (UNFRACTIONATED)

## 2017-07-10 LAB — GLUCOSE, CAPILLARY: Glucose-Capillary: 199 mg/dL — ABNORMAL HIGH (ref 65–99)

## 2017-07-10 LAB — PROTIME-INR
INR: 1.48
PROTHROMBIN TIME: 17.8 s — AB (ref 11.4–15.2)

## 2017-07-10 LAB — LACTIC ACID, PLASMA: LACTIC ACID, VENOUS: 1.2 mmol/L (ref 0.5–1.9)

## 2017-07-10 LAB — APTT: aPTT: 45 seconds — ABNORMAL HIGH (ref 24–36)

## 2017-07-10 MED ORDER — FENOFIBRATE 160 MG PO TABS
160.0000 mg | ORAL_TABLET | Freq: Every day | ORAL | Status: DC
  Administered 2017-07-11 – 2017-07-14 (×4): 160 mg via ORAL
  Filled 2017-07-10 (×4): qty 1

## 2017-07-10 MED ORDER — INSULIN ASPART 100 UNIT/ML ~~LOC~~ SOLN
0.0000 [IU] | SUBCUTANEOUS | Status: DC
  Administered 2017-07-10: 2 [IU] via SUBCUTANEOUS
  Administered 2017-07-11 (×2): 1 [IU] via SUBCUTANEOUS

## 2017-07-10 MED ORDER — AMLODIPINE BESYLATE 5 MG PO TABS
5.0000 mg | ORAL_TABLET | Freq: Every day | ORAL | Status: DC
  Administered 2017-07-11 – 2017-07-14 (×4): 5 mg via ORAL
  Filled 2017-07-10 (×4): qty 1

## 2017-07-10 MED ORDER — HYDROCODONE-ACETAMINOPHEN 5-325 MG PO TABS
1.0000 | ORAL_TABLET | ORAL | Status: DC | PRN
  Administered 2017-07-11 – 2017-07-12 (×2): 1 via ORAL
  Filled 2017-07-10 (×2): qty 1

## 2017-07-10 MED ORDER — HEPARIN (PORCINE) IN NACL 100-0.45 UNIT/ML-% IJ SOLN
1750.0000 [IU]/h | INTRAMUSCULAR | Status: DC
Start: 2017-07-10 — End: 2017-07-12
  Administered 2017-07-10: 1500 [IU]/h via INTRAVENOUS
  Administered 2017-07-11: 1600 [IU]/h via INTRAVENOUS
  Administered 2017-07-12: 1750 [IU]/h via INTRAVENOUS
  Filled 2017-07-10 (×3): qty 250

## 2017-07-10 MED ORDER — HEPARIN (PORCINE) IN NACL 100-0.45 UNIT/ML-% IJ SOLN: 1500.0000 [IU]/h | INTRAMUSCULAR | Status: DC

## 2017-07-10 MED ORDER — SODIUM CHLORIDE 0.9 % IV SOLN
INTRAVENOUS | Status: DC
  Administered 2017-07-10 – 2017-07-14 (×6): via INTRAVENOUS

## 2017-07-10 NOTE — Progress Notes (Addendum)
ANTICOAGULATION CONSULT NOTE - Initial Consult  Pharmacy Consult for Heparin Indication: DVT  No Known Allergies  Patient Measurements: Height: 5\' 7"  (170.2 cm) Weight: 241 lb (109.3 kg) IBW/kg (Calculated) : 66.1 HEPARIN DW (KG): 90.6  Vital Signs: Temp: 98.4 F (36.9 C) (11/26 1346) Temp Source: Oral (11/26 1346) BP: 152/79 (11/26 1346) Pulse Rate: 77 (11/26 1346)  Labs: Recent Labs    07/10/17 1451  HGB 12.6*  HCT 39.8  PLT 313    Estimated Creatinine Clearance: 40.1 mL/min (A) (by C-G formula based on SCr of 1.85 mg/dL (H)).   Medical History: Past Medical History:  Diagnosis Date  . Adenomatous polyp of colon   . Arthritis    bilateral knees  . CKD (chronic kidney disease), stage II    Dr. Patel,nephrology "stable last 3 yrs"  . Diabetes mellitus without complication ([place])    Dr. Laurance Flatten.PCP  . History of basal cell cancer   . History of DVT of lower extremity    2011--  LEFT LEG  . History of kidney stones   . History of melanoma excision    RIGHT ARM  . Hyperlipidemia   . Hypertension   . Impaired hearing    hearing aids bilateral  . Melanoma ([place]) 1990   arm and back -Melanoma, basal cell others- Dr. Edgardo Roys follows.  . Near syncope 08/25/2015   Dr. Roland Rack once"found blood clots in lung"  . Nocturia   . Prostate cancer [facility] 2014   Dr. Cherylann Parr- "seed implant" '14  . Ulcer of left lower leg ([place])    recent pcp visit dr Laurance Flatten--  ordered betadine wet/dry dsg daily (03-18-2013)  [place] IN 1 WEEK    Medications:  See med rec  Assessment: 76 yo male presented to ED with persistent right foot swelling. Patient does have history of DVT and currently on Xarelto. He has not missed any doses of Xarelto and last dose taken was this AM at 0630. Patient was sent over from [facility] MD to fule out recurrent DVT. Venous US shows Occlusive thrombus in the mid right femoral vein. Pharmacy asked to start heparin. Will  start Heparin 24 hours after rivaroxaban dose. Will have baseline labs drawn  to assess anticoagulation.  Goal of Therapy:  Heparin level 0.3-0.7 units/ml aPTT 66-102 seconds Monitor platelets by anticoagulation protocol: Yes   Plan:   Start heparin infusion at 1500 units/hr Check baseline APTT and anti-Xa levels, then in 8 hours and daily while on heparin Continue to monitor H&H and platelets  Isac Sarna, BS Pharm D, BCPS Clinical Pharmacist Pager [phone] 07/10/2017,3:45 PM   Addum:  Will start heparin drip now as has active clot.  F/u am labs Excell Seltzer, PharmD

## 2017-07-10 NOTE — Plan of Care (Signed)
Pt alert and oriented x4. Wife at bedside. Able to make needs known. No complaint of pain or distress at this time. BLE discoloration. Right leg red and warm to touch with 1+ pitting. Telemetry in place. Pt wears glasses and has hearing aides. 20G to right AC with NS at 75. Call light within reach. Bed in lowest position. Will continue to monitor.

## 2017-07-10 NOTE — H&P (Addendum)
History and Physical    Ricardo D Doell Jr. CZY:[id number] DOB: 1941-02-23 DOA: 07/10/2017  Referring MD/NP/PA: Alvino Chapel  PCP: [name], MD  Outpatient Specialists: None  Patient coming from: Home   Chief Complaint: Recurrent DVT  HPI: Ricardo Dauber. is a 76 y.o. male with medical history significant of DVT and PE, stage 2 CKD presenting w/ recurrent DVT. 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  Pt states that he has had RLE swelling and redness over the last 2 weeks. Was initially evaluated as ? LLE infection. Per pt, was instruced by PCP to increase lasix. Was also placed on keflex. Pt states that redness and subjective fevers resolved. Pt states that swelling persisted over last week. Mild discomfort. No missed xarelto doses. No recent prolonged trips. Nonsmoker.  ED Course: Presented to ER afebrile, hemodynamically stable. Cr 1.7. LE u/s: Occlusive thrombus in the mid right femoral vein.  Review of Systems: As per HPI otherwise 10 point review of systems negative.    Past Medical History:  Diagnosis Date  . Adenomatous polyp of colon   . Arthritis    bilateral knees  . CKD (chronic kidney disease), stage II    Dr. Patel,nephrology "stable last 3 yrs"  . Diabetes mellitus without complication ([place])    Dr. Laurance Flatten.PCP  . History of basal cell cancer   . History of DVT of lower extremity    2011--  LEFT LEG  . History of kidney stones   . History of melanoma excision    RIGHT ARM  . Hyperlipidemia   . Hypertension   . Impaired hearing    hearing aids bilateral  . Melanoma ([place]) 1990   arm and back -Melanoma, basal cell others- Dr. Edgardo Roys follows.  . Near syncope 08/25/2015   Dr. Roland Rack once"found blood clots in lung"  . Nocturia   . Prostate cancer [facility]) 2014   Dr. Cherylann Parr- "seed implant" '14  . Ulcer of left lower leg ([place])    recent pcp visit dr Laurance Flatten--  ordered betadine wet/dry dsg daily (03-18-2013)  [place] IN 1 WEEK    Past Surgical History:  Procedure Laterality Date  . KIDNEY STONE EXTRACTIONS  LAST ONE 1970   X2 OPEN/  X1  URETEROSCOPY  . MELANOMA EXCISION RIGHT ARM  1990  . PROSTATE BIOPSY    . RADIOACTIVE SEED IMPLANT N/A 03/21/2013   Procedure: RADIOACTIVE SEED IMPLANT;  Surgeon: Malka So, MD;  [facility];  Service: Urology;  Laterality: N/A;  87 seeds implanted    . TONSILLECTOMY    . TOTAL KNEE ARTHROPLASTY Right 03/21/2016   Procedure: RIGHT TOTAL KNEE ARTHROPLASTY;  Surgeon: Paralee Cancel, MD;  Location: [facility];  Service: Orthopedics;  Laterality: Right;     reports that  has never smoked. he has never used smokeless tobacco. He reports that he does not drink alcohol or use drugs.  No Known Allergies  Family History  Problem Relation Age of Onset  . Breast cancer Mother   . Stroke Mother   . Hypertension Mother   . Lung cancer Father        smoked  . Ulcers Father   . Esophageal cancer Brother   . Cancer Paternal Uncle        prostate  . Cancer Paternal Grandfather        prostate  . Heart attack Neg Hx      Prior  to Admission medications   Medication Sig Start Date End Date Taking? Authorizing Provider  cephALEXin (KEFLEX) 500 MG capsule Take 1 capsule (500 mg total) by mouth 2 (two) times daily. 07/07/17  Yes Wardell Honour, MD  fenofibrate 160 MG tablet TAKE 1 TABLET BY MOUTH DAILY Patient taking differently: TAKE 1 TABLET (160 mg) BY MOUTH DAILY 05/17/15  Yes Gann, Tiffany A, PA-C  furosemide (LASIX) 20 MG tablet Take 1 tablet (20 mg total) daily as needed by mouth. 07/03/17  Yes Terald Sleeper, PA-C  olmesartan (BENICAR) 40 MG tablet Take 1 tablet (40 mg total) by mouth daily. 06/02/17  Yes [name], MD  rivaroxaban (XARELTO) 20 MG TABS tablet Take one tablet by mouth daily with food Patient taking differently: Take 20 mg by mouth every morning. Take one tablet by mouth daily with food 11/22/16  Yes [name], MD    Triamcinolone Acetonide (TRIAMCINOLONE 0.1 % CREAM : EUCERIN) CREA Apply 1 application topically 2 (two) times daily. 07/07/17  Yes Wardell Honour, MD  vitamin B-12 (CYANOCOBALAMIN) 1000 MCG tablet Take 1,000 mcg by mouth daily.   Yes [provider]  amLODipine (NORVASC) 5 MG tablet Take 1 tablet (5 mg total) by mouth daily. Patient not taking: Reported on 07/10/2017 05/01/17   Chevis Pretty, FNP  blood glucose meter kit and supplies KIT Dispense based on patient and insurance preference. Use up to four times daily as directed. (FOR ICD-9 250.00, 250.01). 02/14/17   [name], MD  glucose blood (ONE TOUCH ULTRA TEST) test strip Test up to tid. DX E11.9 01/05/17   [name], MD    Physical Exam: Vitals:   07/10/17 1346 07/10/17 1500 07/10/17 1530 07/10/17 1653  BP: (!) 152/79 (!) 151/74 134/65 (!) 148/77  Pulse: 77 76 67 68  Resp: 20     Temp: 98.4 F (36.9 C)     TempSrc: Oral     SpO2: 96% 100% 100% 100%  Weight: 109.3 kg (241 lb)     Height: _0  (1.702 m)         Constitutional: NAD, calm, comfortable Vitals:   07/10/17 1346 07/10/17 1500 07/10/17 1530 07/10/17 1653  BP: (!) 152/79 (!) 151/74 134/65 (!) 148/77  Pulse: 77 76 67 68  Resp: 20     Temp: 98.4 F (36.9 C)     TempSrc: Oral     SpO2: 96% 100% 100% 100%  Weight: 109.3 kg (241 lb)     Height: _1  (1.702 m)      Eyes: PERRL, lids and conjunctivae normal ENMT: Mucous membranes are moist. Posterior pharynx clear of any exudate or lesions.Normal dentition.  Neck: normal, supple, no masses, no thyromegaly Respiratory: clear to auscultation bilaterally, no wheezing, no crackles. Normal respiratory effort. No accessory muscle use.  Cardiovascular: Regular rate and rhythm, no murmurs / rubs / gallops. No extremity edema. 2+ pedal pulses. No carotid bruits.  Abdomen: no tenderness, no masses palpated. No hepatosplenomegaly. Bowel sounds positive.  Musculoskeletal: no clubbing / cyanosis.  No joint deformity upper and lower extremities. Good ROM, no contractures. Normal muscle tone.  Skin: noted bilateral LE venous stasis ulcers and R sided mild R sided swelling  Neurologic: CN 2-12 grossly intact. Sensation intact, DTR normal. Strength 5/5 in all 4.  Psychiatric: Normal judgment and insight. Alert and oriented x 3. Normal mood.    Labs on Admission: I have personally reviewed following labs and imaging studies  CBC: Recent  Labs  Lab 07/07/17 0832 07/10/17 1451  WBC 11.0* 7.9  NEUTROABS 8.3* 5.5  HGB 13.2 12.6*  HCT 37.8 39.8  MCV 84 89.2  PLT 243 456   Basic Metabolic Panel: Recent Labs  Lab 07/07/17 0832 07/10/17 1451  NA 139 132*  K 4.6 4.3  CL 101 99*  CO2 22 24  GLUCOSE 149* 116*  BUN 33* 28*  CREATININE 1.85* 1.69*  CALCIUM 9.1 8.7*   GFR: Estimated Creatinine Clearance: 43.9 mL/min (A) (by C-G formula based on SCr of 1.69 mg/dL (H)). Liver Function Tests: Recent Labs  Lab 07/10/17 1451  AST 20  ALT 21  ALKPHOS 60  BILITOT 0.6  PROT 6.8  ALBUMIN 3.3*   No results for input(s): LIPASE, AMYLASE in the last 168 hours. No results for input(s): AMMONIA in the last 168 hours. Coagulation Profile: Recent Labs  Lab 07/10/17 1524  INR 1.48   Cardiac Enzymes: No results for input(s): CKTOTAL, CKMB, CKMBINDEX, TROPONINI in the last 168 hours. BNP (last 3 results) No results for input(s): PROBNP in the last 8760 hours. HbA1C: No results for input(s): HGBA1C in the last 72 hours. CBG: No results for input(s): GLUCAP in the last 168 hours. Lipid Profile: No results for input(s): CHOL, HDL, LDLCALC, TRIG, CHOLHDL, LDLDIRECT in the last 72 hours. Thyroid Function Tests: No results for input(s): TSH, T4TOTAL, FREET4, T3FREE, THYROIDAB in the last 72 hours. Anemia Panel: No results for input(s): VITAMINB12, FOLATE, FERRITIN, TIBC, IRON, RETICCTPCT in the last 72 hours. Urine analysis:    Component Value Date/Time   COLORURINE YELLOW  07/29/2016 1330   APPEARANCEUR Clear 12/29/2016 1616   LABSPEC 1.014 07/29/2016 1330   PHURINE 5.0 07/29/2016 1330   GLUCOSEU Negative 12/29/2016 1616   HGBUR NEGATIVE 07/29/2016 1330   BILIRUBINUR Negative 12/29/2016 1616   KETONESUR NEGATIVE 07/29/2016 1330   PROTEINUR 2+ (A) 12/29/2016 1616   PROTEINUR 100 (A) 07/29/2016 1330   UROBILINOGEN negative 12/16/2014 1529   NITRITE Negative 12/29/2016 1616   NITRITE NEGATIVE 07/29/2016 1330   LEUKOCYTESUR Negative 12/29/2016 1616   Sepsis Labs: )No results found for this or any previous visit (from the past 240 hour(s)).   Radiological Exams on Admission: US Venous Img Lower Unilateral Right  Result Date: 07/10/2017 CLINICAL DATA:  Right lower extremity swelling EXAM: RIGHT LOWER EXTREMITY VENOUS DOPPLER ULTRASOUND TECHNIQUE: Gray-scale sonography with graded compression, as well as color Doppler and duplex ultrasound were performed to evaluate the lower extremity deep venous systems from the level of the common femoral vein and including the common femoral, femoral, profunda femoral, popliteal and calf veins including the posterior tibial, peroneal and gastrocnemius veins when visible. The superficial great saphenous vein was also interrogated. Spectral Doppler was utilized to evaluate flow at rest and with distal augmentation maneuvers in the common femoral, femoral and popliteal veins. COMPARISON:  None. FINDINGS: Contralateral Common Femoral Vein: Respiratory phasicity is normal and symmetric with the symptomatic side. No evidence of thrombus. Normal compressibility. Common Femoral Vein: No evidence of thrombus. Normal compressibility, respiratory phasicity and response to augmentation. Saphenofemoral Junction: No evidence of thrombus. Normal compressibility and flow on color Doppler imaging. Profunda Femoral Vein: No evidence of thrombus. Normal compressibility and flow on color Doppler imaging. Femoral Vein: Occlusive thrombus noted in the  mid right femoral vein in the right thigh Popliteal Vein: No evidence of thrombus. Normal compressibility, respiratory phasicity and response to augmentation. Calf Veins: No evidence of thrombus. Normal compressibility and flow on color Doppler imaging. Superficial  Great Saphenous Vein: No evidence of thrombus. Normal compressibility. Venous Reflux:  None. Other Findings:  None. IMPRESSION: Occlusive thrombus in the mid right femoral vein. Electronically Signed   By: Rolm Baptise M.D.   On: 07/10/2017 12:52      Assessment/Plan Active Problems:   Recurrent deep vein thrombosis (DVT) (HCC)   1-Recurrent DVT  -Recurrent issue -will place on heparin gtt  -EDP ([place]) discussed case w/ Vascular Surgery ([place]) -may need IVC filter  -heparin gtt per protocol  -noted ? Concern for cellulitis vs venous stasis ulcer  -no overt signs of superimposed infection at present-though low threshold for infectious coverage  -defer abx for now  -blood cultures  -reassess as clinically indicated  -hypercoaguable panel given recurrent VTE  2-HTN  -BP stable  -titrate BP regimen   3-CKD  -stage 2  -appears at baseline  -follow closely  4-DM -SSI  -A1C  FENGI- carb modified-heart healthy diet    DVT prophylaxis: heparin per pharmacy  Code Status: Full Code  Family Communication: Wife at bedside   Disposition Plan: Pending further evaluation   Consults called: Vascular aware of case in ER   Admission status: Inpt     Deneise Lever MD [facility] Pager [phone]  If 7PM-7AM, please contact night-coverage [email or web address] Password [email or web address]  07/10/2017, 5:50 PM

## 2017-07-10 NOTE — ED Provider Notes (Signed)
[facility] EMERGENCY DEPARTMENT Provider Note   CSN: [id number] Arrival date & time: 07/10/17  1319     History   Chief Complaint Chief Complaint  Patient presents with  . Abnormal Lab    HPI Terrel Nesheiwat. is a 76 y.o. male.  HPI Patient presents with swelling and pain in his right lower leg.  Has had for around the last week.  Has been seen by PCP and thought it could be another infection.  He is already on Xarelto for previous DVT.  Also worry of infection.  Per PCP note if there was no clot in the leg he would be treated with escalating antibiotics.  Currently on Keflex.  No chest pain or trouble breathing.  Outpatient ultrasound done and showed mid right femoral DVT.  He has been compliant with his medication except for a week ago Monday when he had a late dose of the Xarelto although he did have his total number of doses.  No fevers.  No drainage from the foot.  A week ago had swelling of both lower extremities but worse on the right side.  Lasix had been increased and the swelling went down but still greater on the right compared to left.  Has chronic venous insufficiency of the legs. Past Medical History:  Diagnosis Date  . Adenomatous polyp of colon   . Arthritis    bilateral knees  . CKD (chronic kidney disease), stage II    Dr. Patel,nephrology "stable last 3 yrs"  . Diabetes mellitus without complication ([place])    Dr. Laurance Flatten.PCP  . History of basal cell cancer   . History of DVT of lower extremity    2011--  LEFT LEG  . History of kidney stones   . History of melanoma excision    RIGHT ARM  . Hyperlipidemia   . Hypertension   . Impaired hearing    hearing aids bilateral  . Melanoma ([place]) 1990   arm and back -Melanoma, basal cell others- Dr. Edgardo Roys follows.  . Near syncope 08/25/2015   Dr. Roland Rack once"found blood clots in lung"  . Nocturia   . Prostate cancer [facility]) 2014   Dr. Cherylann Parr- "seed implant" '14  . Ulcer of left lower leg ([place])    recent pcp visit dr Laurance Flatten--  ordered betadine wet/dry dsg daily (03-18-2013)  [place] IN 1 WEEK    Patient Active Problem List   Diagnosis Date Noted  . Localized edema 07/07/2017  . Pure hypercholesterolemia 07/13/2016  . Essential hypertension 07/13/2016  . Vitamin D deficiency 07/13/2016  . Morbid obesity ([place]) 05/27/2016  . Anemia of chronic disease   . LOC (loss of consciousness) ([place]) 04/08/2016  . TIA (transient ischemic attack) 04/08/2016  . 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 S/P knee replacement 03/21/2016  . Anemia associated with chronic renal failure 10/28/2015  . Hyperparathyroidism , secondary, non-renal ([place]) 10/28/2015  . Pulmonary emboli ([place]) 08/27/2015  . Near syncope 08/25/2015  . DOE (dyspnea on exertion) 06/02/2015  . Type 2 diabetes mellitus with vascular disease ([place]) 06/02/2015  . Melanoma of skin ([place]) 10/09/2013  . 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 Chronic kidney disease, stage III (moderate) ([place]) 10/09/2013  . Peripheral vascular insufficiency ([place]) 10/09/2013  . Statin intolerance 10/09/2013  . Prostate cancer ([place]) 12/25/2012    Past Surgical History:  Procedure Laterality Date  . KIDNEY STONE EXTRACTIONS  LAST ONE 1970   X2 OPEN/  X1  URETEROSCOPY  . MELANOMA EXCISION RIGHT ARM  1990  . PROSTATE BIOPSY    . RADIOACTIVE SEED IMPLANT N/A 03/21/2013   Procedure: RADIOACTIVE SEED IMPLANT;  Surgeon: Malka So, MD;  [facility];  Service: Urology;  Laterality: N/A;  87 seeds implanted    . TONSILLECTOMY    . TOTAL KNEE ARTHROPLASTY Right 03/21/2016   Procedure: RIGHT TOTAL KNEE ARTHROPLASTY;  Surgeon: Paralee Cancel, MD;  Location: [facility];  Service: Orthopedics;  Laterality: Right;       Home Medications    Prior to Admission medications   Medication Sig Start Date End Date Taking? Authorizing Provider  cephALEXin (KEFLEX) 500 MG capsule Take 1 capsule  (500 mg total) by mouth 2 (two) times daily. 07/07/17  Yes Wardell Honour, MD  fenofibrate 160 MG tablet TAKE 1 TABLET BY MOUTH DAILY Patient taking differently: TAKE 1 TABLET (160 mg) BY MOUTH DAILY 05/17/15  Yes Gann, Tiffany A, PA-C  furosemide (LASIX) 20 MG tablet Take 1 tablet (20 mg total) daily as needed by mouth. 07/03/17  Yes Terald Sleeper, PA-C  olmesartan (BENICAR) 40 MG tablet Take 1 tablet (40 mg total) by mouth daily. 06/02/17  Yes [name], MD  rivaroxaban (XARELTO) 20 MG TABS tablet Take one tablet by mouth daily with food Patient taking differently: Take 20 mg by mouth every morning. Take one tablet by mouth daily with food 11/22/16  Yes [name], MD  Triamcinolone Acetonide (TRIAMCINOLONE 0.1 % CREAM : EUCERIN) CREA Apply 1 application topically 2 (two) times daily. 07/07/17  Yes Wardell Honour, MD  vitamin B-12 (CYANOCOBALAMIN) 1000 MCG tablet Take 1,000 mcg by mouth daily.   Yes [provider]  amLODipine (NORVASC) 5 MG tablet Take 1 tablet (5 mg total) by mouth daily. Patient not taking: Reported on 07/10/2017 05/01/17   Chevis Pretty, FNP  blood glucose meter kit and supplies KIT Dispense based on patient and insurance preference. Use up to four times daily as directed. (FOR ICD-9 250.00, 250.01). 02/14/17   [name], MD  glucose blood (ONE TOUCH ULTRA TEST) test strip Test up to tid. DX E11.9 01/05/17   [name], MD    Family History Family History  Problem Relation Age of Onset  . Breast cancer Mother   . Stroke Mother   . Hypertension Mother   . Lung cancer Father        smoked  . Ulcers Father   . Esophageal cancer Brother   . Cancer Paternal Uncle        prostate  . Cancer Paternal Grandfather        prostate  . Heart attack Neg Hx     Social History Social History   Tobacco Use  . Smoking status: Never Smoker  . Smokeless tobacco: Never Used  Substance Use Topics  . Alcohol use: No  . Drug use: No      Allergies   Patient has no known allergies.   Review of Systems Review of Systems  Constitutional: Negative for appetite change.  HENT: Negative for congestion.   Respiratory: Negative for shortness of breath.   Cardiovascular: Positive for leg swelling. Negative for chest pain.  Gastrointestinal: Negative for abdominal pain.  Genitourinary: Negative for flank pain.  Musculoskeletal: Negative for back pain.  Skin: Positive for wound.  Neurological: Negative for weakness.  Hematological: Negative for adenopathy.  Psychiatric/Behavioral: Negative for confusion.     Physical Exam  Updated Vital Signs BP (!) 148/77   Pulse 68   Temp 98.4 F (36.9 C) (Oral)   Resp 20   Ht '5\' 7"'  (1.702 m)   Wt 109.3 kg (241 lb)   SpO2 100%   BMI 37.75 kg/m   Physical Exam  Constitutional: He appears well-developed.  HENT:  Head: Normocephalic.  Eyes: Pupils are equal, round, and reactive to light.  Neck: Normal range of motion.  Cardiovascular: Normal rate and regular rhythm.  Pulmonary/Chest: Effort normal. He has no wheezes.  Abdominal: There is no tenderness.  Musculoskeletal: He exhibits edema.  Pitting edema bilateral lower extremities, worse on right side.  Chronic venous changes of both lower extremities.  Pulses intact in right foot.  Purplish color changes on toes and forefoot of right foot.  No frank tenderness to the foot.  No drainage.  Neurological: He is alert.  Skin: Skin is warm. Capillary refill takes less than 2 seconds.     ED Treatments / Results  Labs (all labs ordered are listed, but only abnormal results are displayed) Labs Reviewed  CBC WITH DIFFERENTIAL/PLATELET - Abnormal; Notable for the following components:      Result Value   Hemoglobin 12.6 (*)    All other components within normal limits  COMPREHENSIVE METABOLIC PANEL - Abnormal; Notable for the following components:   Sodium 132 (*)    Chloride 99 (*)    Glucose, Bld 116 (*)    BUN 28 (*)     Creatinine, Ser 1.69 (*)    Calcium 8.7 (*)    Albumin 3.3 (*)    GFR calc non Af Amer 38 (*)    GFR calc Af Amer 44 (*)    All other components within normal limits  PROTIME-INR - Abnormal; Notable for the following components:   Prothrombin Time 17.8 (*)    All other components within normal limits  APTT - Abnormal; Notable for the following components:   aPTT 45 (*)    All other components within normal limits  LACTIC ACID, PLASMA  HEPARIN LEVEL (UNFRACTIONATED)  CBC    EKG  EKG Interpretation None       Radiology US Venous Img Lower Unilateral Right  Result Date: 07/10/2017 CLINICAL DATA:  Right lower extremity swelling EXAM: RIGHT LOWER EXTREMITY VENOUS DOPPLER ULTRASOUND TECHNIQUE: Gray-scale sonography with graded compression, as well as color Doppler and duplex ultrasound were performed to evaluate the lower extremity deep venous systems from the level of the common femoral vein and including the common femoral, femoral, profunda femoral, popliteal and calf veins including the posterior tibial, peroneal and gastrocnemius veins when visible. The superficial great saphenous vein was also interrogated. Spectral Doppler was utilized to evaluate flow at rest and with distal augmentation maneuvers in the common femoral, femoral and popliteal veins. COMPARISON:  None. FINDINGS: Contralateral Common Femoral Vein: Respiratory phasicity is normal and symmetric with the symptomatic side. No evidence of thrombus. Normal compressibility. Common Femoral Vein: No evidence of thrombus. Normal compressibility, respiratory phasicity and response to augmentation. Saphenofemoral Junction: No evidence of thrombus. Normal compressibility and flow on color Doppler imaging. Profunda Femoral Vein: No evidence of thrombus. Normal compressibility and flow on color Doppler imaging. Femoral Vein: Occlusive thrombus noted in the mid right femoral vein in the right thigh Popliteal Vein: No evidence of  thrombus. Normal compressibility, respiratory phasicity and response to augmentation. Calf Veins: No evidence of thrombus. Normal compressibility and flow on color Doppler imaging. Superficial Great Saphenous Vein: No evidence of  thrombus. Normal compressibility. Venous Reflux:  None. Other Findings:  None. IMPRESSION: Occlusive thrombus in the mid right femoral vein. Electronically Signed   By: Rolm Baptise M.D.   On: 07/10/2017 12:52    Procedures Procedures (including critical care time)  Medications Ordered in ED Medications  heparin ADULT infusion 100 units/mL (25000 units/264m sodium chloride 0.45%) (not administered)     Initial Impression / Assessment and Plan / ED Course  I have reviewed the triage vital signs and the nursing notes.  Pertinent labs & imaging results that were available during my care of the patient were reviewed by me and considered in my medical decision making (see chart for details).     Patient with DVT in his right leg.  History of same and started on Xarelto.  Has been on antibiotics by his primary care doctor.  Color changes could be due to venous insufficiency.  Discussed with Dr. ETawni Millersfrom vascular surgery.  With the clot being mid thigh does not think that he would benefit from acute vascular intervention.  However since the failure on anticoagulation may require a [place] filter since he has had previous DVTs and PEs also.  Will admit to hospitalist on heparin.  Final Clinical Impressions(s) / ED Diagnoses   Final diagnoses:  Acute deep vein thrombosis (DVT) of femoral vein of right lower extremity ([facility]    ED Discharge Orders    None       PDavonna Belling MD 07/10/17 1718

## 2017-07-10 NOTE — ED Triage Notes (Signed)
PT states he came over to have an ultrasound on his right lower leg today and was sent to the ED d/t DVT positive. PT states right foot red and swollen and denies any injury. PT states he already on xarellto.

## 2017-07-10 NOTE — Progress Notes (Signed)
   HPI  Patient presents today with persistent right foot swelling.  Patient states this started 1 week ago with both legs, the left has improved. Patient states that he was seen on Friday and started on Keflex twice daily as well as Kenalog ointment with mild improvement. He is taking Lasix twice daily currently.  Patient does have history of DVT, he has not missed any doses of Xarelto, however did take it late last Monday.  He denies any new shortness of breath. He states that he is feeling better since his flulike illness about a week ago.  Had mild improvement with Kenalog ointment and Keflex   PMH: Smoking status noted ROS: Per HPI  Objective: BP 129/69   Pulse 78   Temp (!) 97 F (36.1 C) (Oral)   Ht 5\' 7"  (1.702 m)   Wt 241 lb (109.3 kg)   BMI 37.75 kg/m  Gen: NAD, alert, cooperative with exam HEENT: NCAT CV: RRR, good S1/S2, no murmur Resp: CTABL, no wheezes, non-labored Ext: No center and staining bilaterally, right greater than left edema that is very impressive with bright red coloring of the skin Neuro: Alert and oriented, No gross deficits  Assessment and plan:  #Unilateral leg edema Given appearance and no difficult improvement over the weekend with Keflex plus Kenalog ointment I recommended rule out recurrent DVT with ultrasound today. If positive, would likely recommend emergency room visit to start heparin or Lovenox Still cellulitis versus clot versus venous stasis dermatitis   Laroy Apple, MD [facility] 07/10/2017, 11:05 AM

## 2017-07-10 NOTE — Telephone Encounter (Signed)
Pt scheduled with Dr Wendi Snipes at 10:55 today.

## 2017-07-10 NOTE — ED Notes (Signed)
Pt sitting up eating dinner tray.

## 2017-07-10 NOTE — Patient Instructions (Signed)
Great to meet you!  We will rule out a clot with an ultrasound today, continue the ointment.   If there iis no clot I will treat more aggressively with antibiotics

## 2017-07-10 NOTE — Telephone Encounter (Signed)
What symptoms do you have? Swollen foot  How long have you been sick? Two weeks  Have you been seen for this problem? Yes twice, it is not getting better   If your provider decides to give you a prescription, which pharmacy would you like for it to be sent to?  [facility]   Patient informed that this information will be sent to the clinical staff for review and that they should receive a follow up call.

## 2017-07-11 ENCOUNTER — Other Ambulatory Visit: Payer: Self-pay

## 2017-07-11 LAB — CBC
HCT: 36.9 % — ABNORMAL LOW (ref 39.0–52.0)
Hemoglobin: 11.8 g/dL — ABNORMAL LOW (ref 13.0–17.0)
MCH: 28.2 pg (ref 26.0–34.0)
MCHC: 32 g/dL (ref 30.0–36.0)
MCV: 88.3 fL (ref 78.0–100.0)
PLATELETS: 316 10*3/uL (ref 150–400)
RBC: 4.18 MIL/uL — AB (ref 4.22–5.81)
RDW: 14.1 % (ref 11.5–15.5)
WBC: 7.1 10*3/uL (ref 4.0–10.5)

## 2017-07-11 LAB — GLUCOSE, CAPILLARY
GLUCOSE-CAPILLARY: 119 mg/dL — AB (ref 65–99)
GLUCOSE-CAPILLARY: 140 mg/dL — AB (ref 65–99)
GLUCOSE-CAPILLARY: 148 mg/dL — AB (ref 65–99)
Glucose-Capillary: 107 mg/dL — ABNORMAL HIGH (ref 65–99)
Glucose-Capillary: 113 mg/dL — ABNORMAL HIGH (ref 65–99)
Glucose-Capillary: 121 mg/dL — ABNORMAL HIGH (ref 65–99)

## 2017-07-11 LAB — HEPARIN LEVEL (UNFRACTIONATED)
Heparin Unfractionated: 0.92 IU/mL — ABNORMAL HIGH (ref 0.30–0.70)
Heparin Unfractionated: 0.37 IU/mL (ref 0.30–0.70)

## 2017-07-11 LAB — COMPREHENSIVE METABOLIC PANEL
ALBUMIN: 2.9 g/dL — AB (ref 3.5–5.0)
ALT: 16 U/L — AB (ref 17–63)
AST: 18 U/L (ref 15–41)
Alkaline Phosphatase: 55 U/L (ref 38–126)
Anion gap: 8 (ref 5–15)
BILIRUBIN TOTAL: 0.5 mg/dL (ref 0.3–1.2)
BUN: 27 mg/dL — AB (ref 6–20)
CHLORIDE: 107 mmol/L (ref 101–111)
CO2: 23 mmol/L (ref 22–32)
CREATININE: 1.52 mg/dL — AB (ref 0.61–1.24)
Calcium: 8.8 mg/dL — ABNORMAL LOW (ref 8.9–10.3)
GFR calc Af Amer: 50 mL/min — ABNORMAL LOW (ref >60)
GFR calc non Af Amer: 43 mL/min — ABNORMAL LOW (ref >60)
GLUCOSE: 122 mg/dL — AB (ref 65–99)
POTASSIUM: 4.4 mmol/L (ref 3.5–5.1)
Sodium: 138 mmol/L (ref 135–145)
TOTAL PROTEIN: 6 g/dL — AB (ref 6.5–8.1)

## 2017-07-11 LAB — APTT
aPTT: 64 seconds — ABNORMAL HIGH (ref 24–36)
aPTT: 54 seconds — ABNORMAL HIGH (ref 24–36)

## 2017-07-11 LAB — PROTIME-INR
INR: 1.17
PROTHROMBIN TIME: 14.8 s (ref 11.4–15.2)

## 2017-07-11 LAB — ANTITHROMBIN III: AntiThromb III Func: 94 % (ref 75–120)

## 2017-07-11 MED ORDER — HEPARIN BOLUS VIA INFUSION
1000.0000 [IU] | Freq: Once | INTRAVENOUS | Status: AC
  Administered 2017-07-11: 1000 [IU] via INTRAVENOUS
  Filled 2017-07-11: qty 1000

## 2017-07-11 MED ORDER — INSULIN ASPART 100 UNIT/ML ~~LOC~~ SOLN
0.0000 [IU] | Freq: Three times a day (TID) | SUBCUTANEOUS | Status: DC
  Administered 2017-07-12: 1 [IU] via SUBCUTANEOUS

## 2017-07-11 NOTE — Progress Notes (Signed)
PROGRESS NOTE    Ricardo Mayer.  MGN:[id number] DOB: 11-06-1940 DOA: 07/10/2017 PCP: [name], MD  Outpatient Specialists:     Brief Narrative:  Patient is a 76 y.o. male with medical history significant for extensive right lower extremity DVT as per Doppler ultrasound done in 2017, PE, stage 2 CKD. Patient was admitted with swelling of right lower extremity worrisome for recurrent DVT. Pt was on Xarelto 20 mg prior to admission. Discussed with the Radiologist re - recent doppler ultrasound of right leg report. The Radiologist is of the opinion that the reported DVT was likely chronic. Patient was informed, and treated for LLE infection (Prior to admission). Patient could not tell me if the right leg swelling is improving or not. Will need collateral information. Patient is married, and lives with the wife. Meanwhile, will continue Heparin drip for now (until sequence of events leading to patient's admission, and current status are clarified). My suspicion is that the patient will be discharged back on Xarelto (or Eliquis).    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 No SOB or Chest pain  Objective: Vitals:   07/10/17 1653 07/10/17 1907 07/10/17 2303 07/11/17 0553  BP: (!) 148/77 (!) 168/77 (!) 158/65 116/86  Pulse: 68 77 73 81  Resp:  20 20 16   Temp:  97.9 F (36.6 C) 98.4 F (36.9 C) 98.3 F (36.8 C)  TempSrc:  Oral Oral Oral  SpO2: 100% 100% 98% 99%  Weight:  106 kg (233 lb 11.2 oz)     Height:  5\' 7"  (1.702 m)      Intake/Output Summary (Last 24 hours) at 07/11/2017 1241 Last data filed at 07/11/2017 1018 Gross per 24 hour  Intake 1230 ml  Output 900 ml  Net 330 ml   Filed Weights   07/10/17 1346 07/10/17 1907  Weight: 109.3 kg (241 lb) 106 kg (233 lb 11.2 oz)    Examination:  General exam: Appears calm and comfortable  Respiratory system: Clear to auscultation. Respiratory effort normal. Cardiovascular system: S1 & S2 heard, RRR. Gastrointestinal system: Abdomen is obese, soft and nontender. No organomegaly or masses felt. Normal bowel sounds heard. Central nervous system: Alert and oriented. No focal neurological deficits. Extremities: Chronic skin changes bilateral lower legs. Swelling of RLE (RLE >>LLE).  Data Reviewed: I have personally reviewed following labs and imaging studies  CBC: Recent Labs  Lab 07/07/17 0832 07/10/17 1451 07/11/17 0500  WBC 11.0* 7.9 7.1  NEUTROABS 8.3* 5.5  --   HGB 13.2 12.6* 11.8*  HCT 37.8 39.8 36.9*  MCV 84 89.2 88.3  PLT 243 313 916   Basic Metabolic Panel: Recent Labs  Lab 07/07/17 0832 07/10/17 1451 07/11/17 0500  NA 139 132* 138  K 4.6 4.3 4.4  CL 101 99* 107  CO2 22 24 23   GLUCOSE 149* 116* 122*  BUN 33* 28* 27*  CREATININE 1.85* 1.69* 1.52*  CALCIUM 9.1 8.7* 8.8*   GFR: Estimated Creatinine Clearance: 48 mL/min (A) (by C-G formula based on  SCr of 1.52 mg/dL (H)). Liver Function Tests: Recent Labs  Lab 07/10/17 1451 07/11/17 0500  AST 20 18  ALT 21 16*  ALKPHOS 60 55  BILITOT 0.6 0.5  PROT 6.8 6.0*  ALBUMIN 3.3* 2.9*   No results for input(s): LIPASE, AMYLASE in the last 168 hours. No results for input(s): AMMONIA in the last 168 hours. Coagulation Profile: Recent Labs  Lab 07/10/17 1524 07/11/17 0500  INR 1.48 1.17   Cardiac Enzymes: No results for input(s): CKTOTAL, CKMB, CKMBINDEX, TROPONINI in the last 168 hours. BNP (last 3 results) No results for input(s): PROBNP in the  last 8760 hours. HbA1C: No results for input(s): HGBA1C in the last 72 hours. CBG: Recent Labs  Lab 07/10/17 1906 07/11/17 0059 07/11/17 0440 07/11/17 0729 07/11/17 1105  GLUCAP 199* 107* 119* 113* 140*   Lipid Profile: No results for input(s): CHOL, HDL, LDLCALC, TRIG, CHOLHDL, LDLDIRECT in the last 72 hours. Thyroid Function Tests: No results for input(s): TSH, T4TOTAL, FREET4, T3FREE, THYROIDAB in the last 72 hours. Anemia Panel: No results for input(s): VITAMINB12, FOLATE, FERRITIN, TIBC, IRON, RETICCTPCT in the last 72 hours. Urine analysis:    Component Value Date/Time   COLORURINE YELLOW 07/29/2016 1330   APPEARANCEUR Clear 12/29/2016 1616   LABSPEC 1.014 07/29/2016 1330   PHURINE 5.0 07/29/2016 1330   GLUCOSEU Negative 12/29/2016 1616   HGBUR NEGATIVE 07/29/2016 1330   BILIRUBINUR Negative 12/29/2016 1616   KETONESUR NEGATIVE 07/29/2016 1330   PROTEINUR 2+ (A) 12/29/2016 1616   PROTEINUR 100 (A) 07/29/2016 1330   UROBILINOGEN negative 12/16/2014 1529   NITRITE Negative 12/29/2016 1616   NITRITE NEGATIVE 07/29/2016 1330   LEUKOCYTESUR Negative 12/29/2016 1616   Sepsis Labs: @LABRCNTIP (procalcitonin:4,lacticidven:4)  )No results found for this or any previous visit (from the past 240 hour(s)).       Radiology Studies: US Venous Img Lower Unilateral Right  Addendum Date: 07/11/2017   ADDENDUM REPORT: 07/11/2017 11:33 ADDENDUM: Comparison was made to prior lower extremity venous ultrasound performed 08/27/2015. There was extensive DVT noted throughout the right lower extremity at that time including right femoral vein, popliteal vein and calf veins. The DVT on this study is less extensive than on prior study, contained within the right femoral vein. The vein is less distended suggesting this is chronic residual DVT. IMPRESSION: Right femoral vein occlusive DVT is likely residual chronic DVT, less extensive than on prior study from 08/27/2015. Electronically  Signed   By: Rolm Baptise M.D.   On: 07/11/2017 11:33   Result Date: 07/11/2017 CLINICAL DATA:  Right lower extremity swelling EXAM: RIGHT LOWER EXTREMITY VENOUS DOPPLER ULTRASOUND TECHNIQUE: Gray-scale sonography with graded compression, as well as color Doppler and duplex ultrasound were performed to evaluate the lower extremity deep venous systems from the level of the common femoral vein and including the common femoral, femoral, profunda femoral, popliteal and calf veins including the posterior tibial, peroneal and gastrocnemius veins when visible. The superficial great saphenous vein was also interrogated. Spectral Doppler was utilized to evaluate flow at rest and with distal augmentation maneuvers in the common femoral, femoral and popliteal veins. COMPARISON:  None. FINDINGS: Contralateral Common Femoral Vein: Respiratory phasicity is normal and symmetric with the symptomatic side. No evidence of thrombus. Normal compressibility. Common Femoral Vein: No evidence of thrombus. Normal compressibility, respiratory phasicity and response to augmentation. Saphenofemoral Junction: No evidence of thrombus. Normal compressibility and flow on color Doppler imaging. Profunda Femoral Vein: No evidence of thrombus. Normal compressibility and flow on color Doppler  imaging. Femoral Vein: Occlusive thrombus noted in the mid right femoral vein in the right thigh Popliteal Vein: No evidence of thrombus. Normal compressibility, respiratory phasicity and response to augmentation. Calf Veins: No evidence of thrombus. Normal compressibility and flow on color Doppler imaging. Superficial Great Saphenous Vein: No evidence of thrombus. Normal compressibility. Venous Reflux:  None. Other Findings:  None. IMPRESSION: Occlusive thrombus in the mid right femoral vein. Electronically Signed: By: Rolm Baptise M.D. On: 07/10/2017 12:52        Scheduled Meds: . amLODipine  5 mg Oral Daily  . fenofibrate  160 mg Oral Daily  .  insulin aspart  0-9 Units Subcutaneous Q4H   Continuous Infusions: . sodium chloride 75 mL/hr at 07/11/17 0842  . heparin 1,600 Units/hr (07/11/17 1229)     LOS: 1 day    Time spent: 35 Minutes.    Dana Allan, MD  [facility] Pager #: [phone] 7PM-7AM contact night coverage as above

## 2017-07-11 NOTE — Progress Notes (Signed)
Dr. Baltazar Najjar paged to see if blood sugars could be changed to achs instead of q4h d/t pt being on a diet and not NPO. Waiting for call back/orders.

## 2017-07-11 NOTE — Progress Notes (Addendum)
ANTICOAGULATION CONSULT NOTE   Pharmacy Consult for Heparin Indication: DVT  No Known Allergies  Patient Measurements: Height: 5\' 7"  (170.2 cm) Weight: 233 lb 11.2 oz (106 kg) IBW/kg (Calculated) : 66.1 HEPARIN DW (KG): 89.6  Vital Signs: Temp: 98.3 F (36.8 C) (11/27 0553) Temp Source: Oral (11/27 0553) BP: 116/86 (11/27 0553) Pulse Rate: 81 (11/27 0553)  Labs: Recent Labs    07/10/17 1451 07/10/17 1524 07/11/17 0500  HGB 12.6*  --  11.8*  HCT 39.8  --  36.9*  PLT 313  --  316  APTT  --  45* 64*  LABPROT  --  17.8* 14.8  INR  --  1.48 1.17  HEPARINUNFRC  --  >2.20* 0.92*  CREATININE 1.69*  --  1.52*    Estimated Creatinine Clearance: 48 mL/min (A) (by C-G formula based on SCr of 1.52 mg/dL (H)).   Medical History: Past Medical History:  Diagnosis Date  . Adenomatous polyp of colon   . Arthritis    bilateral knees  . CKD (chronic kidney disease), stage II    Dr. Patel,nephrology "stable last 3 yrs"  . Diabetes mellitus without complication ([place])    Dr. Laurance Flatten.PCP  . History of basal cell cancer   . History of DVT of lower extremity    2011--  LEFT LEG  . History of kidney stones   . History of melanoma excision    RIGHT ARM  . Hyperlipidemia   . Hypertension   . Impaired hearing    hearing aids bilateral  . Melanoma ([place]) 1990   arm and back -Melanoma, basal cell others- Dr. Edgardo Roys follows.  . Near syncope 08/25/2015   Dr. Roland Rack once"found blood clots in lung"  . Nocturia   . Prostate cancer [facility]) 2014   Dr. Cherylann Parr- "seed implant" '14  . Ulcer of left lower leg ([place])    recent pcp visit dr Laurance Flatten--  ordered betadine wet/dry dsg daily (03-18-2013)  [place] IN 1 WEEK    Medications:  See med rec  Assessment: 76 yo male presented to ED with persistent right foot swelling. Patient does have history of DVT and was on Xarelto.  Venous US shows Occlusive thrombus in the mid right femoral vein. Pharmacy asked to  start heparin.  APTT 64sec.  Will need to dose heparin based on aPTT until effects of xarelto wear off.  Goal of Therapy:  Heparin level 0.3-0.7 units/ml aPTT 66-102 seconds Monitor platelets by anticoagulation protocol: Yes   Plan:   Increase heparin infusion to 1600 units/hr Check  APTT and anti-Xa level in 8 hours and daily while on heparin Continue to monitor H&H and platelets Thanks for allowing pharmacy to be a part of this patient's care.  Excell Seltzer, PharmD Clinical Pharmacist 07/11/2017,8:33 AM  Addum:  APTT 54 sec.  HL 0.37   Will give small heparin bolus 1000 units and increase drip to 1750 units/hr.  F/u am labs

## 2017-07-12 DIAGNOSIS — I82409 Acute embolism and thrombosis of unspecified deep veins of unspecified lower extremity: Secondary | ICD-10-CM

## 2017-07-12 DIAGNOSIS — I10 Essential (primary) hypertension: Secondary | ICD-10-CM

## 2017-07-12 DIAGNOSIS — L03115 Cellulitis of right lower limb: Secondary | ICD-10-CM

## 2017-07-12 DIAGNOSIS — L0390 Cellulitis, unspecified: Secondary | ICD-10-CM | POA: Diagnosis present

## 2017-07-12 LAB — PROTIME-INR
INR: 1.04
PROTHROMBIN TIME: 13.5 s (ref 11.4–15.2)

## 2017-07-12 LAB — CBC
HEMATOCRIT: 37.4 % — AB (ref 39.0–52.0)
Hemoglobin: 12.1 g/dL — ABNORMAL LOW (ref 13.0–17.0)
MCH: 28.6 pg (ref 26.0–34.0)
MCHC: 32.4 g/dL (ref 30.0–36.0)
MCV: 88.4 fL (ref 78.0–100.0)
PLATELETS: 278 10*3/uL (ref 150–400)
RBC: 4.23 MIL/uL (ref 4.22–5.81)
RDW: 14 % (ref 11.5–15.5)
WBC: 8.3 10*3/uL (ref 4.0–10.5)

## 2017-07-12 LAB — GLUCOSE, CAPILLARY
GLUCOSE-CAPILLARY: 127 mg/dL — AB (ref 65–99)
Glucose-Capillary: 122 mg/dL — ABNORMAL HIGH (ref 65–99)
Glucose-Capillary: 103 mg/dL — ABNORMAL HIGH (ref 65–99)
Glucose-Capillary: 92 mg/dL (ref 65–99)

## 2017-07-12 LAB — HEPARIN LEVEL (UNFRACTIONATED): Heparin Unfractionated: 0.38 IU/mL (ref 0.30–0.70)

## 2017-07-12 LAB — PROTEIN C ACTIVITY: Protein C Activity: 97 % (ref 73–180)

## 2017-07-12 LAB — PROTEIN S ACTIVITY: Protein S Activity: 103 % (ref 63–140)

## 2017-07-12 LAB — PROTEIN S, TOTAL: Protein S Ag, Total: 111 % (ref 60–150)

## 2017-07-12 LAB — APTT: aPTT: 96 seconds — ABNORMAL HIGH (ref 24–36)

## 2017-07-12 MED ORDER — RIVAROXABAN 20 MG PO TABS
20.0000 mg | ORAL_TABLET | Freq: Every day | ORAL | Status: DC
  Administered 2017-07-12 – 2017-07-14 (×3): 20 mg via ORAL
  Filled 2017-07-12 (×3): qty 1

## 2017-07-12 MED ORDER — CEFAZOLIN SODIUM-DEXTROSE 2-4 GM/100ML-% IV SOLN
2.0000 g | Freq: Three times a day (TID) | INTRAVENOUS | Status: DC
  Administered 2017-07-12 – 2017-07-14 (×6): 2 g via INTRAVENOUS
  Filled 2017-07-12 (×17): qty 100

## 2017-07-12 MED ORDER — FUROSEMIDE 40 MG PO TABS
40.0000 mg | ORAL_TABLET | Freq: Every day | ORAL | Status: DC
  Administered 2017-07-12 – 2017-07-14 (×3): 40 mg via ORAL
  Filled 2017-07-12 (×3): qty 1

## 2017-07-12 MED ORDER — ACETAMINOPHEN 325 MG PO TABS
650.0000 mg | ORAL_TABLET | Freq: Four times a day (QID) | ORAL | Status: DC | PRN
  Administered 2017-07-12: 650 mg via ORAL
  Filled 2017-07-12: qty 2

## 2017-07-12 NOTE — Progress Notes (Signed)
PROGRESS NOTE    Ricardo Mayer.  URK:[id number] DOB: 1940-11-29 DOA: 07/10/2017 PCP: [name], MD    Brief Narrative:  76 year old male with a history of previous right lower extremity DVT on Xarelto, presented to the hospital with worsening swelling and pain in the right lower extremity.  He underwent repeat ultrasound that showed persistent thrombus in the right lower extremity.  He may have also has some element of cellulitis in this extremity.  Started on intravenous antibiotics.  Continued on anticoagulation.   Assessment & Plan:   Active Problems:   Essential hypertension   Recurrent deep vein thrombosis (DVT) (HCC)   Cellulitis   1. Cellulitis.  Patient presents with swelling, redness, pain of right foot/right lower extremity.  Initially thought this was related to new DVT.  Patient underwent venous Dopplers of right lower extremity and this shows a chronic thrombus, similar to thrombus that was present in 08/2015.  Unlikely this represents a new clot.  We will start the patient on intravenous antibiotics.  Keep lower extremity elevated. 2. History of recurrent deep vein thrombosis.  The last episode was 08/2015.  Venous Dopplers were repeated on admission and would comparison was made to prior lower extremity venous ultrasound, there was extensive DVT noted throughout the right lower extremity at that time including right femoral vein, popliteal vein and calf veins.  DVT on the study done on admission was less extensive than on prior study in 08/2015.  Vein was found to be less distended on current study suggesting that this was a chronic residual DVT.  This is unlikely to be a failure of Xarelto  Since there is not appear to be any new thrombus.  We will continue the patient on Xarelto. 3. Hypertension.  Continue on amlodipine.   DVT prophylaxis: Xarelto Code Status: Full code Family Communication: Discussed with wife at the bedside Disposition Plan: Discharge home once  improved   Consultants:     Procedures:     Antimicrobials:   Ancef 11/28 >   Subjective: Continues to have pain in right foot, but better than it was a few days ago.  Feels overall redness is still present, although mildly better  Objective: Vitals:   07/11/17 1300 07/11/17 2100 07/12/17 0417 07/12/17 1312  BP: (!) 141/65 (!) 162/80 135/72 (!) 147/67  Pulse: 78 80 70 76  Resp: 18 18 18 18   Temp: 97.9 F (36.6 C) 98.4 F (36.9 C) 98.3 F (36.8 C) 98.2 F (36.8 C)  TempSrc: Oral Oral Oral Oral  SpO2: 97% 100% 98% 98%  Weight:      Height:        Intake/Output Summary (Last 24 hours) at 07/12/2017 1956 Last data filed at 07/12/2017 1834 Gross per 24 hour  Intake 1060 ml  Output 2200 ml  Net -1140 ml   Filed Weights   07/10/17 1346 07/10/17 1907  Weight: 109.3 kg (241 lb) 106 kg (233 lb 11.2 oz)    Examination:  General exam: Appears calm and comfortable  Respiratory system: Clear to auscultation. Respiratory effort normal. Cardiovascular system: S1 & S2 heard, RRR. No JVD, murmurs, rubs, gallops or clicks.  Gastrointestinal system: Abdomen is nondistended, soft and nontender. No organomegaly or masses felt. Normal bowel sounds heard. Central nervous system: Alert and oriented. No focal neurological deficits. Extremities: Symmetric 5 x 5 power. Skin: Erythema, warmth and swelling noted over right foot, trending up right leg Psychiatry: Judgement and insight appear normal. Mood & affect appropriate.  Data Reviewed: I have personally reviewed following labs and imaging studies  CBC: Recent Labs  Lab 07/07/17 0832 07/10/17 1451 07/11/17 0500 07/12/17 0510  WBC 11.0* 7.9 7.1 8.3  NEUTROABS 8.3* 5.5  --   --   HGB 13.2 12.6* 11.8* 12.1*  HCT 37.8 39.8 36.9* 37.4*  MCV 84 89.2 88.3 88.4  PLT 243 313 316 165   Basic Metabolic Panel: Recent Labs  Lab 07/07/17 0832 07/10/17 1451 07/11/17 0500  NA 139 132* 138  K 4.6 4.3 4.4  CL 101 99* 107    CO2 22 24 23   GLUCOSE 149* 116* 122*  BUN 33* 28* 27*  CREATININE 1.85* 1.69* 1.52*  CALCIUM 9.1 8.7* 8.8*   GFR: Estimated Creatinine Clearance: 48 mL/min (A) (by C-G formula based on SCr of 1.52 mg/dL (H)). Liver Function Tests: Recent Labs  Lab 07/10/17 1451 07/11/17 0500  AST 20 18  ALT 21 16*  ALKPHOS 60 55  BILITOT 0.6 0.5  PROT 6.8 6.0*  ALBUMIN 3.3* 2.9*   No results for input(s): LIPASE, AMYLASE in the last 168 hours. No results for input(s): AMMONIA in the last 168 hours. Coagulation Profile: Recent Labs  Lab 07/10/17 1524 07/11/17 0500 07/12/17 0510  INR 1.48 1.17 1.04   Cardiac Enzymes: No results for input(s): CKTOTAL, CKMB, CKMBINDEX, TROPONINI in the last 168 hours. BNP (last 3 results) No results for input(s): PROBNP in the last 8760 hours. HbA1C: No results for input(s): HGBA1C in the last 72 hours. CBG: Recent Labs  Lab 07/11/17 1620 07/11/17 2031 07/12/17 0727 07/12/17 1122 07/12/17 1626  GLUCAP 121* 148* 122* 103* 92   Lipid Profile: No results for input(s): CHOL, HDL, LDLCALC, TRIG, CHOLHDL, LDLDIRECT in the last 72 hours. Thyroid Function Tests: No results for input(s): TSH, T4TOTAL, FREET4, T3FREE, THYROIDAB in the last 72 hours. Anemia Panel: No results for input(s): VITAMINB12, FOLATE, FERRITIN, TIBC, IRON, RETICCTPCT in the last 72 hours. Sepsis Labs: Recent Labs  Lab 07/10/17 1451  LATICACIDVEN 1.2    No results found for this or any previous visit (from the past 240 hour(s)).       Radiology Studies: No results found.      Scheduled Meds: . amLODipine  5 mg Oral Daily  . fenofibrate  160 mg Oral Daily  . furosemide  40 mg Oral Daily  . rivaroxaban  20 mg Oral Daily   Continuous Infusions: . sodium chloride 75 mL/hr at 07/12/17 1003  .  ceFAZolin (ANCEF) IV 2 g (07/12/17 1657)     LOS: 2 days    Time spent: 30 minutes    Kathie Dike, MD [facility] Pager [phone]  If 7PM-7AM,  please contact night-coverage [email or web address] Password [facility] 07/12/2017, 7:56 PM

## 2017-07-12 NOTE — Progress Notes (Signed)
ANTICOAGULATION CONSULT NOTE   Pharmacy Consult for Heparin Indication: DVT  No Known Allergies  Patient Measurements: Height: 5\' 7"  (170.2 cm) Weight: 233 lb 11.2 oz (106 kg) IBW/kg (Calculated) : 66.1 HEPARIN DW (KG): 89.6  Vital Signs: Temp: 98.3 F (36.8 C) (11/28 0417) Temp Source: Oral (11/28 0417) BP: 135/72 (11/28 0417) Pulse Rate: 70 (11/28 0417)  Labs: Recent Labs    07/10/17 1451  07/10/17 1524 07/11/17 0500 07/11/17 1557 07/12/17 0510  HGB 12.6*  --   --  11.8*  --  12.1*  HCT 39.8  --   --  36.9*  --  37.4*  PLT 313  --   --  316  --  278  APTT  --   --  45* 64* 54*  --   LABPROT  --   --  17.8* 14.8  --  13.5  INR  --   --  1.48 1.17  --  1.04  HEPARINUNFRC  --    < > >2.20* 0.92* 0.37 0.38  CREATININE 1.69*  --   --  1.52*  --   --    < > = values in this interval not displayed.    Estimated Creatinine Clearance: 48 mL/min (A) (by C-G formula based on SCr of 1.52 mg/dL (H)).   Medical History: Past Medical History:  Diagnosis Date  . Adenomatous polyp of colon   . Arthritis    bilateral knees  . CKD (chronic kidney disease), stage II    Dr. Patel,nephrology "stable last 3 yrs"  . Diabetes mellitus without complication ([place])    Dr. Laurance Flatten.PCP  . History of basal cell cancer   . History of DVT of lower extremity    2011--  LEFT LEG  . History of kidney stones   . History of melanoma excision    RIGHT ARM  . Hyperlipidemia   . Hypertension   . Impaired hearing    hearing aids bilateral  . Melanoma ([place]) 1990   arm and back -Melanoma, basal cell others- Dr. Edgardo Roys follows.  . Near syncope 08/25/2015   Dr. Roland Rack once"found blood clots in lung"  . Nocturia   . Prostate cancer [facility]) 2014   Dr. Cherylann Parr- "seed implant" '14  . Ulcer of left lower leg ([place])    recent pcp visit dr Laurance Flatten--  ordered betadine wet/dry dsg daily (03-18-2013)  [place] IN 1 WEEK    Medications:  See med rec  Assessment: 76  yo male presented to ED with persistent right foot swelling. Patient does have history of DVT and was on Xarelto.  Venous US shows Occlusive thrombus in the mid right femoral vein. Pharmacy asked to start heparin.  Heparin level this am 0.38.    Goal of Therapy:  Heparin level 0.3-0.7 units/ml Monitor platelets by anticoagulation protocol: Yes   Plan:   Cont  heparin infusion at 1750 units/hr Check heparin level daily while on heparin Continue to monitor H&H and platelets Thanks for allowing pharmacy to be a part of this patient's care.  Excell Seltzer, PharmD Clinical Pharmacist 07/12/2017,9:11 AM

## 2017-07-12 NOTE — Care Management Note (Signed)
Case Management Note  Patient Details  Name: Ricardo Mayer. MRN: [id number] Date of Birth: [birth year]  Subjective/Objective:   Admitted with DVT. Pt from home, lives with wife, ind with ALD's. Drives, has PCP, and insurance with drug coverage. Pt was on Xeralto pta and has been on SQ lovenox in the past and feels comfortable if that is what is needed. Undecided at this time what anticoagulation pt will DC home.                Action/Plan: Anticipate DC home with self care soon. Benefits check in sent but has not returned yet for lovenox. CM will follow to DC. CM will contact pt with results of benefits check if resulted prior to DC. Anticipate pt will have no difficulty affording any form of anticoagulation.   Expected Discharge Date:  07/13/17               Expected Discharge Plan:  Home/Self Care  In-House Referral:  NA  Discharge planning Services  CM Consult  Post Acute Care Choice:  NA Choice offered to:  NA  Sherald Barge, RN 07/12/2017, 3:33 PM

## 2017-07-13 LAB — LUPUS ANTICOAGULANT PANEL
DRVVT: 124.7 s — ABNORMAL HIGH (ref 0.0–47.0)
PTT LA: 52.1 s — AB (ref 0.0–51.9)

## 2017-07-13 LAB — BETA-2-GLYCOPROTEIN I ABS, IGG/M/A

## 2017-07-13 LAB — HEXAGONAL PHASE PHOSPHOLIPID: Hexagonal Phase Phospholipid: 12 s — ABNORMAL HIGH (ref 0–11)

## 2017-07-13 LAB — GLUCOSE, CAPILLARY
Glucose-Capillary: 109 mg/dL — ABNORMAL HIGH (ref 65–99)
Glucose-Capillary: 102 mg/dL — ABNORMAL HIGH (ref 65–99)
Glucose-Capillary: 122 mg/dL — ABNORMAL HIGH (ref 65–99)
Glucose-Capillary: 129 mg/dL — ABNORMAL HIGH (ref 65–99)

## 2017-07-13 LAB — CARDIOLIPIN ANTIBODIES, IGG, IGM, IGA: Anticardiolipin IgA: 9 APL U/mL (ref 0–11)

## 2017-07-13 LAB — CBC
HCT: 36.5 % — ABNORMAL LOW (ref 39.0–52.0)
Hemoglobin: 11.7 g/dL — ABNORMAL LOW (ref 13.0–17.0)
MCH: 28.5 pg (ref 26.0–34.0)
MCHC: 32.1 g/dL (ref 30.0–36.0)
MCV: 89 fL (ref 78.0–100.0)
PLATELETS: 289 10*3/uL (ref 150–400)
RBC: 4.1 MIL/uL — AB (ref 4.22–5.81)
RDW: 14 % (ref 11.5–15.5)
WBC: 8.7 10*3/uL (ref 4.0–10.5)

## 2017-07-13 LAB — PTT-LA INCUB MIX: PTT-LA Incub Mix: 55.3 s — ABNORMAL HIGH (ref 0.0–48.9)

## 2017-07-13 LAB — DRVVT CONFIRM: dRVVT Confirm: 1.9 ratio — ABNORMAL HIGH (ref 0.8–1.2)

## 2017-07-13 LAB — HOMOCYSTEINE: Homocysteine: 14.4 umol/L (ref 0.0–15.0)

## 2017-07-13 LAB — PTT-LA MIX: PTT-LA MIX: 47.8 s (ref 0.0–48.9)

## 2017-07-13 LAB — PROTEIN C, TOTAL: Protein C, Total: 101 % (ref 60–150)

## 2017-07-13 LAB — DRVVT MIX: DRVVT MIX: 72.5 s — AB (ref 0.0–47.0)

## 2017-07-13 NOTE — Care Management Note (Signed)
Case Management Note  Patient Details  Name: Ricardo Mayer. MRN: [id number] Date of Birth: Jan 10, 1941  Expected Discharge Date:  07/13/17               Expected Discharge Plan:  Home/Self Care  In-House Referral:  NA  Discharge planning Services  CM Consult  Post Acute Care Choice:  NA Choice offered to:  NA  Status of Service:  Completed, signed off  Additional Comments: Per latest MD note pt will DC on xeralto. No further CM needs anticipated. May be re consulted if needs arise.   Benefits check for lovenox returned:       # 1. S/W BRI @ [employer] # [phone]   1. LEVONEX 100 MG BID SQ-SYRINGES   COVER- YES  CO-PAY- $ 100.00  TIER- 4 DRUG  PRIOR APPROVAL- NO    2. ENOXAPARIN 100 MG BID SQ SYRINGES   COVER- YES  CO-PAY- $ 64.00  TIER- 4 DRUG  PRIOR APPROVAL- NO    3 COUMADIN  10 MG OR 2.5 MG BID   COVER- YES ( both )  CO-PAY- $ 40.00  TIER- 3 DRUG  PRIOR APPROVAL- NO    PREFERRED PHARMACY : [facility]      Sherald Barge, RN 07/13/2017, 12:11 PM

## 2017-07-13 NOTE — Care Management Important Message (Signed)
Important Message  Patient Details  Name: Ricardo Mayer. MRN: [id number] Date of Birth: February 12, 1941   Medicare Important Message Given:  Yes    Sherald Barge, RN 07/13/2017, 12:13 PM

## 2017-07-13 NOTE — Progress Notes (Signed)
PROGRESS NOTE    Ricardo Mayer.  FUX:[id number] DOB: 1941-02-08 DOA: 07/10/2017 PCP: [name], MD    Brief Narrative:  76 year old male with a history of previous right lower extremity DVT on Xarelto, presented to the hospital with worsening swelling and pain in the right lower extremity.  He underwent repeat ultrasound that showed persistent thrombus in the right lower extremity.  He may have also has some element of cellulitis in this extremity.  Started on intravenous antibiotics.  Continued on anticoagulation.   Assessment & Plan:   Active Problems:   Essential hypertension   Recurrent deep vein thrombosis (DVT) (HCC)   Cellulitis   1. Cellulitis.  Patient presents with swelling, redness, pain of right foot/right lower extremity.  Initially thought this was related to new DVT.  Patient underwent venous Dopplers of right lower extremity and this shows a chronic thrombus, similar to thrombus that was present in 08/2015.  Unlikely this represents a new clot.  He has been started on intravenous Ancef with some improvement.  Continue current treatments.  Keep lower extremity elevated. 2. History of recurrent deep vein thrombosis.  The last episode was 08/2015.  Venous Dopplers were repeated on admission and would comparison was made to prior lower extremity venous ultrasound, there was extensive DVT noted throughout the right lower extremity at that time including right femoral vein, popliteal vein and calf veins.  DVT on the study done on admission was less extensive than on prior study in 08/2015.  Vein was found to be less distended on current study suggesting that this was a chronic residual DVT.  This is unlikely to be a failure of Xarelto  Since there is not appear to be any new thrombus, we will continue the patient on Xarelto. 3. Hypertension.  Continue on amlodipine.   DVT prophylaxis: Xarelto Code Status: Full code Family Communication: Discussed with wife at the  bedside Disposition Plan: Discharge home once improved   Consultants:     Procedures:     Antimicrobials:   Ancef 11/28 >   Subjective: Feels that he is slowly improving.  Feels that pain in right foot is better.  He is able to bear more weight on it today.  Objective: Vitals:   07/12/17 2100 07/13/17 0500 07/13/17 0959 07/13/17 1300  BP: (!) 162/67 139/60 (!) 162/70 (!) 152/64  Pulse: 77 79  76  Resp: 18 18  18   Temp: 98.7 F (37.1 C) 98.2 F (36.8 C)  97.8 F (36.6 C)  TempSrc: Oral Oral  Oral  SpO2: 99% 100%  100%  Weight:      Height:        Intake/Output Summary (Last 24 hours) at 07/13/2017 1941 Last data filed at 07/13/2017 1739 Gross per 24 hour  Intake 4411.25 ml  Output 2560 ml  Net 1851.25 ml   Filed Weights   07/10/17 1346 07/10/17 1907  Weight: 109.3 kg (241 lb) 106 kg (233 lb 11.2 oz)    Examination:  General exam: Appears calm and comfortable  Respiratory system: Clear to auscultation. Respiratory effort normal. Cardiovascular system: S1 & S2 heard, RRR. No JVD, murmurs, rubs, gallops or clicks.  Gastrointestinal system: Abdomen is nondistended, soft and nontender. No organomegaly or masses felt. Normal bowel sounds heard. Central nervous system: Alert and oriented. No focal neurological deficits. Extremities: Symmetric 5 x 5 power. Skin: Erythema and warmth in right leg/foot mildly better. Psychiatry: Judgement and insight appear normal. Mood & affect appropriate.  Data Reviewed: I have personally reviewed following labs and imaging studies  CBC: Recent Labs  Lab 07/07/17 0832 07/10/17 1451 07/11/17 0500 07/12/17 0510 07/13/17 0013  WBC 11.0* 7.9 7.1 8.3 8.7  NEUTROABS 8.3* 5.5  --   --   --   HGB 13.2 12.6* 11.8* 12.1* 11.7*  HCT 37.8 39.8 36.9* 37.4* 36.5*  MCV 84 89.2 88.3 88.4 89.0  PLT 243 313 316 278 829   Basic Metabolic Panel: Recent Labs  Lab 07/07/17 0832 07/10/17 1451 07/11/17 0500  NA 139 132* 138  K  4.6 4.3 4.4  CL 101 99* 107  CO2 22 24 23   GLUCOSE 149* 116* 122*  BUN 33* 28* 27*  CREATININE 1.85* 1.69* 1.52*  CALCIUM 9.1 8.7* 8.8*   GFR: Estimated Creatinine Clearance: 48 mL/min (A) (by C-G formula based on SCr of 1.52 mg/dL (H)). Liver Function Tests: Recent Labs  Lab 07/10/17 1451 07/11/17 0500  AST 20 18  ALT 21 16*  ALKPHOS 60 55  BILITOT 0.6 0.5  PROT 6.8 6.0*  ALBUMIN 3.3* 2.9*   No results for input(s): LIPASE, AMYLASE in the last 168 hours. No results for input(s): AMMONIA in the last 168 hours. Coagulation Profile: Recent Labs  Lab 07/10/17 1524 07/11/17 0500 07/12/17 0510  INR 1.48 1.17 1.04   Cardiac Enzymes: No results for input(s): CKTOTAL, CKMB, CKMBINDEX, TROPONINI in the last 168 hours. BNP (last 3 results) No results for input(s): PROBNP in the last 8760 hours. HbA1C: No results for input(s): HGBA1C in the last 72 hours. CBG: Recent Labs  Lab 07/12/17 1626 07/12/17 2236 07/13/17 0732 07/13/17 1134 07/13/17 1631  GLUCAP 92 127* 109* 102* 122*   Lipid Profile: No results for input(s): CHOL, HDL, LDLCALC, TRIG, CHOLHDL, LDLDIRECT in the last 72 hours. Thyroid Function Tests: No results for input(s): TSH, T4TOTAL, FREET4, T3FREE, THYROIDAB in the last 72 hours. Anemia Panel: No results for input(s): VITAMINB12, FOLATE, FERRITIN, TIBC, IRON, RETICCTPCT in the last 72 hours. Sepsis Labs: Recent Labs  Lab 07/10/17 1451  LATICACIDVEN 1.2    No results found for this or any previous visit (from the past 240 hour(s)).       Radiology Studies: No results found.      Scheduled Meds: . amLODipine  5 mg Oral Daily  . fenofibrate  160 mg Oral Daily  . furosemide  40 mg Oral Daily  . rivaroxaban  20 mg Oral Daily   Continuous Infusions: . sodium chloride 75 mL/hr at 07/12/17 2345  .  ceFAZolin (ANCEF) IV Stopped (07/13/17 1435)     LOS: 3 days    Time spent: 30 minutes    Kathie Dike, MD [facility] Pager  [phone]  If 7PM-7AM, please contact night-coverage [email or web address] Password [facility] 07/13/2017, 7:41 PM

## 2017-07-13 NOTE — Care Management (Signed)
disregard benefits check information in previous note from 07/13/2017 at 1213. Information incorrect. Correct information not available at this time.

## 2017-07-14 ENCOUNTER — Encounter: Payer: Self-pay | Admitting: Internal Medicine

## 2017-07-14 LAB — GLUCOSE, CAPILLARY
Glucose-Capillary: 123 mg/dL — ABNORMAL HIGH (ref 65–99)
Glucose-Capillary: 123 mg/dL — ABNORMAL HIGH (ref 65–99)

## 2017-07-14 LAB — FACTOR 5 LEIDEN

## 2017-07-14 MED ORDER — TRIAMCINOLONE 0.1 % CREAM:EUCERIN CREAM 1:1: 1.0000 "application " | TOPICAL_CREAM | Freq: Two times a day (BID) | CUTANEOUS | 1 refills | Status: DC

## 2017-07-14 MED ORDER — DOXYCYCLINE HYCLATE 100 MG PO CAPS: 100.0000 mg | ORAL_CAPSULE | Freq: Two times a day (BID) | ORAL | 0 refills | Status: DC

## 2017-07-14 NOTE — Discharge Summary (Signed)
Physician Discharge Summary  Ricardo Mayer. WNU:[id number] DOB: [birth year] DOA: 07/10/2017  PCP: [name], MD  Admit date: 07/10/2017 Discharge date: 07/14/2017  Admitted From: Home Disposition: Home  Recommendations for Outpatient Follow-up:  1. Follow up with PCP in 1-2 weeks 2. Please obtain BMP/CBC in one week  Home Health: Equipment/Devices:  Discharge Condition: Stable CODE STATUS: Full code Diet recommendation: Heart Healthy   Brief/Interim Summary: 76 year old male with a history of previous right lower extremity DVT on Xarelto, presented to the hospital with worsening swelling and pain in the right lower extremity.  He underwent repeat ultrasound that showed persistent thrombus in the right lower extremity.  He may have also has some element of cellulitis in this extremity.  Started on intravenous antibiotics.  Continued on anticoagulation.    Discharge Diagnoses:  Active Problems:   Essential hypertension   Recurrent deep vein thrombosis (DVT) (HCC)   Cellulitis  1. Cellulitis.  Patient presents with swelling, redness, pain of right foot/right lower extremity.  Initially thought this was related to new DVT.  Patient underwent venous Dopplers of right lower extremity and this shows a chronic thrombus, similar to thrombus that was present in 08/2015.  Unlikely that this represents a new clot.    The patient was treated with intravenous Ancef and had clinical improvement.  Lower extremities less erythematous, tender, warm.  Swelling is slowly improving.  He has been transitioned to oral doxycycline.  Keep lower extremity elevated. 2. History of recurrent deep vein thrombosis.  The last episode was 08/2015.  Venous Dopplers were repeated on admission and would comparison was made to prior lower extremity venous ultrasound, there was extensive DVT noted throughout the right lower extremity at that time including right femoral vein, popliteal vein and calf veins.  DVT on  the study done on admission was less extensive than on prior study in 08/2015.  Vein was found to be less distended on current study suggesting that this was a chronic residual DVT.  This is unlikely to be a failure of Xarelto  Since there is not appear to be any new thrombus, we will continue the patient on Xarelto. 3. Hypertension.  Continue on amlodipine.     Discharge Instructions  Discharge Instructions    Diet - low sodium heart healthy   Complete by:  As directed    Increase activity slowly   Complete by:  As directed      Allergies as of 07/14/2017   No Known Allergies     Medication List    STOP taking these medications   cephALEXin 500 MG capsule Commonly known as:  KEFLEX     TAKE these medications   amLODipine 5 MG tablet Commonly known as:  NORVASC Take 1 tablet (5 mg total) by mouth daily.   blood glucose meter kit and supplies Kit Dispense based on patient and insurance preference. Use up to four times daily as directed. (FOR ICD-9 250.00, 250.01).   doxycycline 100 MG capsule Commonly known as:  VIBRAMYCIN Take 1 capsule (100 mg total) by mouth 2 (two) times daily.   fenofibrate 160 MG tablet TAKE 1 TABLET BY MOUTH DAILY What changed:    how much to take  how to take this  when to take this   furosemide 20 MG tablet Commonly known as:  LASIX Take 1 tablet (20 mg total) daily as needed by mouth.   glucose blood test strip Commonly known as:  ONE TOUCH ULTRA TEST Test up  to tid. DX E11.9   olmesartan 40 MG tablet Commonly known as:  BENICAR Take 1 tablet (40 mg total) by mouth daily.   rivaroxaban 20 MG Tabs tablet Commonly known as:  XARELTO Take one tablet by mouth daily with food What changed:    how much to take  how to take this  when to take this  additional instructions   triamcinolone 0.1 % cream : eucerin Crea Apply 1 application topically 2 (two) times daily.   vitamin B-12 1000 MCG tablet Commonly known as:   CYANOCOBALAMIN Take 1,000 mcg by mouth daily.      Follow-up Information    Timmothy Euler, MD On 07/19/2017.   Specialty:  Family Medicine Why:  follow up in 7-10 days keep right leg elevated   Appt time: 2:10 with Enos Fling information: [place] [id number] [phone]          No Known Allergies  Consultations:     Procedures/Studies: US Venous Img Lower Unilateral Right  Addendum Date: 07/11/2017   ADDENDUM REPORT: 07/11/2017 11:33 ADDENDUM: Comparison was made to prior lower extremity venous ultrasound performed 08/27/2015. There was extensive DVT noted throughout the right lower extremity at that time including right femoral vein, popliteal vein and calf veins. The DVT on this study is less extensive than on prior study, contained within the right femoral vein. The vein is less distended suggesting this is chronic residual DVT. IMPRESSION: Right femoral vein occlusive DVT is likely residual chronic DVT, less extensive than on prior study from 08/27/2015. Electronically Signed   By: Rolm Baptise M.D.   On: 07/11/2017 11:33   Result Date: 07/11/2017 CLINICAL DATA:  Right lower extremity swelling EXAM: RIGHT LOWER EXTREMITY VENOUS DOPPLER ULTRASOUND TECHNIQUE: Gray-scale sonography with graded compression, as well as color Doppler and duplex ultrasound were performed to evaluate the lower extremity deep venous systems from the level of the common femoral vein and including the common femoral, femoral, profunda femoral, popliteal and calf veins including the posterior tibial, peroneal and gastrocnemius veins when visible. The superficial great saphenous vein was also interrogated. Spectral Doppler was utilized to evaluate flow at rest and with distal augmentation maneuvers in the common femoral, femoral and popliteal veins. COMPARISON:  None. FINDINGS: Contralateral Common Femoral Vein: Respiratory phasicity is normal and symmetric with the symptomatic  side. No evidence of thrombus. Normal compressibility. Common Femoral Vein: No evidence of thrombus. Normal compressibility, respiratory phasicity and response to augmentation. Saphenofemoral Junction: No evidence of thrombus. Normal compressibility and flow on color Doppler imaging. Profunda Femoral Vein: No evidence of thrombus. Normal compressibility and flow on color Doppler imaging. Femoral Vein: Occlusive thrombus noted in the mid right femoral vein in the right thigh Popliteal Vein: No evidence of thrombus. Normal compressibility, respiratory phasicity and response to augmentation. Calf Veins: No evidence of thrombus. Normal compressibility and flow on color Doppler imaging. Superficial Great Saphenous Vein: No evidence of thrombus. Normal compressibility. Venous Reflux:  None. Other Findings:  None. IMPRESSION: Occlusive thrombus in the mid right femoral vein. Electronically Signed: By: Rolm Baptise M.D. On: 07/10/2017 12:52       Subjective: Right lower extremity is less painful.  Feels that overall he is improving.  Discharge Exam: Vitals:   07/13/17 2142 07/14/17 0559  BP: (!) 145/81 (!) 166/78  Pulse: 76 77  Resp: 18 16  Temp: 98.6 F (37 C) 98.2 F (36.8 C)  SpO2: 99% 100%   Vitals:   07/13/17  1300 07/13/17 2046 07/13/17 2142 07/14/17 0559  BP: (!) 152/64  (!) 145/81 (!) 166/78  Pulse: 76  76 77  Resp: '18  18 16  '$ Temp: 97.8 F (36.6 C)  98.6 F (37 C) 98.2 F (36.8 C)  TempSrc: Oral  Oral Oral  SpO2: 100% 97% 99% 100%  Weight:      Height:        General: Pt is alert, awake, not in acute distress Cardiovascular: RRR, S1/S2 +, no rubs, no gallops Respiratory: CTA bilaterally, no wheezing, no rhonchi Abdominal: Soft, NT, ND, bowel sounds + Extremities: Warmth, erythema in right lower extremity improving    The results of significant diagnostics from this hospitalization (including imaging, microbiology, ancillary and laboratory) are listed below for reference.      Microbiology: No results found for this or any previous visit (from the past 240 hour(s)).   Labs: BNP (last 3 results) No results for input(s): BNP in the last 8760 hours. Basic Metabolic Panel: Recent Labs  Lab 07/10/17 1451 07/11/17 0500  NA 132* 138  K 4.3 4.4  CL 99* 107  CO2 24 23  GLUCOSE 116* 122*  BUN 28* 27*  CREATININE 1.69* 1.52*  CALCIUM 8.7* 8.8*   Liver Function Tests: Recent Labs  Lab 07/10/17 1451 07/11/17 0500  AST 20 18  ALT 21 16*  ALKPHOS 60 55  BILITOT 0.6 0.5  PROT 6.8 6.0*  ALBUMIN 3.3* 2.9*   No results for input(s): LIPASE, AMYLASE in the last 168 hours. No results for input(s): AMMONIA in the last 168 hours. CBC: Recent Labs  Lab 07/10/17 1451 07/11/17 0500 07/12/17 0510 07/13/17 0013  WBC 7.9 7.1 8.3 8.7  NEUTROABS 5.5  --   --   --   HGB 12.6* 11.8* 12.1* 11.7*  HCT 39.8 36.9* 37.4* 36.5*  MCV 89.2 88.3 88.4 89.0  PLT 313 316 278 289   Cardiac Enzymes: No results for input(s): CKTOTAL, CKMB, CKMBINDEX, TROPONINI in the last 168 hours. BNP: Invalid input(s): POCBNP CBG: Recent Labs  Lab 07/13/17 1134 07/13/17 1631 07/13/17 2124 07/14/17 0810 07/14/17 1127  GLUCAP 102* 122* 129* 123* 123*   D-Dimer No results for input(s): DDIMER in the last 72 hours. Hgb A1c No results for input(s): HGBA1C in the last 72 hours. Lipid Profile No results for input(s): CHOL, HDL, LDLCALC, TRIG, CHOLHDL, LDLDIRECT in the last 72 hours. Thyroid function studies No results for input(s): TSH, T4TOTAL, T3FREE, THYROIDAB in the last 72 hours.  Invalid input(s): FREET3 Anemia work up No results for input(s): VITAMINB12, FOLATE, FERRITIN, TIBC, IRON, RETICCTPCT in the last 72 hours. Urinalysis    Component Value Date/Time   COLORURINE YELLOW 07/29/2016 1330   APPEARANCEUR Clear 12/29/2016 1616   LABSPEC 1.014 07/29/2016 1330   PHURINE 5.0 07/29/2016 1330   GLUCOSEU Negative 12/29/2016 1616   HGBUR NEGATIVE 07/29/2016 1330    BILIRUBINUR Negative 12/29/2016 1616   KETONESUR NEGATIVE 07/29/2016 1330   PROTEINUR 2+ (A) 12/29/2016 1616   PROTEINUR 100 (A) 07/29/2016 1330   UROBILINOGEN negative 12/16/2014 1529   NITRITE Negative 12/29/2016 1616   NITRITE NEGATIVE 07/29/2016 1330   LEUKOCYTESUR Negative 12/29/2016 1616   Sepsis Labs Invalid input(s): PROCALCITONIN,  WBC,  LACTICIDVEN Microbiology No results found for this or any previous visit (from the past 240 hour(s)).   Time coordinating discharge: Over 30 minutes  SIGNED:   Kathie Dike, MD  [facility] 07/14/2017, 7:02 PM Pager   If 7PM-7AM, please contact night-coverage [email or web address] Password  [email or web address]

## 2017-07-14 NOTE — Progress Notes (Signed)
Pt's IV catheter removed and intact. Pt's IV site clean dry and intact. Discharge instructions including medications and follow up appointments were reviewed and discussed with patient. All questions were answered and no further questions at this time. Pt in stable condition and in no acute distress at time of discharge. Pt escorted by RN.

## 2017-07-17 LAB — PROTHROMBIN GENE MUTATION

## 2017-07-19 ENCOUNTER — Encounter: Payer: Self-pay | Admitting: Physician Assistant

## 2017-07-19 ENCOUNTER — Ambulatory Visit (INDEPENDENT_AMBULATORY_CARE_PROVIDER_SITE_OTHER): Payer: Medicare Other | Admitting: Physician Assistant

## 2017-07-19 VITALS — BP 133/84 | HR 84 | Temp 96.5°F | Ht 67.0 in | Wt 231.2 lb

## 2017-07-19 DIAGNOSIS — L03115 Cellulitis of right lower limb: Secondary | ICD-10-CM

## 2017-07-19 DIAGNOSIS — I82409 Acute embolism and thrombosis of unspecified deep veins of unspecified lower extremity: Secondary | ICD-10-CM

## 2017-07-19 MED ORDER — DOXYCYCLINE HYCLATE 100 MG PO CAPS: 100.0000 mg | ORAL_CAPSULE | Freq: Two times a day (BID) | ORAL | 0 refills | Status: DC

## 2017-07-19 NOTE — Progress Notes (Signed)
BP 133/84   Pulse 84   Temp (!) 96.5 F (35.8 C) (Oral)   Ht '5\' 7"'  (1.702 m)   Wt 231 lb 3.2 oz (104.9 kg)   BMI 36.21 kg/m    Subjective:    Patient ID: Ricardo Huguenin., male    DOB: 1941/07/12, 76 y.o.   MRN: [id number]  HPI: Ricardo Clink. is a 76 y.o. male presenting on 07/19/2017 for Hospitalization Follow-up  This patient comes in for hospital follow-up.  He was admitted for possible DVT and cellulitis.  Upon his visit and testing to the hospital, his DVT was actually a previous DVT and just a little bit smaller.  His main issue for being a bit it was the cellulitis of the right lower leg.  He has had  oral medications from Dr. Sabra Heck.  This had not gotten better.  2 Mondays ago he was sent to the hospital for an ultrasound which showed a DVT.  But after the hospital stay it was discovered this was again just the previous DVT and slightly swollen.  He was treated with Ancef at the hospital and could tell a big difference.  He is to be very diligent about having the leg up and can tell there is some difference in the swelling.  He only has 3 more pills of doxycycline.  We can refill this for him.  He will call us if there are any more issues.  We will plan to do a CBC and a be met today.  Relevant past medical, surgical, family and social history reviewed and updated as indicated. Allergies and medications reviewed and updated.  Past Medical History:  Diagnosis Date  . Adenomatous polyp of colon   . Arthritis    bilateral knees  . CKD (chronic kidney disease), stage II    Dr. Patel,nephrology "stable last 3 yrs"  . Diabetes mellitus without complication ([place])    Dr. Laurance Flatten.PCP  . History of basal cell cancer   . History of DVT of lower extremity    2011--  LEFT LEG  . History of kidney stones   . History of melanoma excision    RIGHT ARM  . Hyperlipidemia   . Hypertension   . Impaired hearing    hearing aids bilateral  . Melanoma ([place]) 1990   arm and back -Melanoma,  basal cell others- Dr. Edgardo Roys follows.  . Near syncope 08/25/2015   Dr. Roland Rack once"found blood clots in lung"  . Nocturia   . Prostate cancer [facility]) 2014   Dr. Cherylann Parr- "seed implant" '14  . Ulcer of left lower leg ([place])    recent pcp visit dr Laurance Flatten--  ordered betadine wet/dry dsg daily (03-18-2013)  [place] IN 1 WEEK    Past Surgical History:  Procedure Laterality Date  . KIDNEY STONE EXTRACTIONS  LAST ONE 1970   X2 OPEN/  X1  URETEROSCOPY  . MELANOMA EXCISION RIGHT ARM  1990  . PROSTATE BIOPSY    . RADIOACTIVE SEED IMPLANT N/A 03/21/2013   Procedure: RADIOACTIVE SEED IMPLANT;  Surgeon: Malka So, MD;  [facility];  Service: Urology;  Laterality: N/A;  87 seeds implanted    . TONSILLECTOMY    . TOTAL KNEE ARTHROPLASTY Right 03/21/2016   Procedure: RIGHT TOTAL KNEE ARTHROPLASTY;  Surgeon: Paralee Cancel, MD;  Location: [facility];  Service: Orthopedics;  Laterality: Right;    Review of Systems  Constitutional: Negative.  Negative for appetite  change, fatigue and fever.  HENT: Negative.   Eyes: Negative.  Negative for pain and visual disturbance.  Respiratory: Negative.  Negative for cough, chest tightness, shortness of breath and wheezing.   Cardiovascular: Positive for leg swelling. Negative for chest pain and palpitations.  Gastrointestinal: Negative.  Negative for abdominal pain, diarrhea, nausea and vomiting.  Endocrine: Negative.   Genitourinary: Negative.   Musculoskeletal: Positive for arthralgias. Negative for joint swelling.  Skin: Positive for color change and rash.  Neurological: Negative.  Negative for weakness, numbness and headaches.  Psychiatric/Behavioral: Negative.     Allergies as of 07/19/2017   No Known Allergies     Medication List        Accurate as of 07/19/17  2:28 PM. Always use your most recent med list.          amLODipine 5 MG tablet Commonly known as:  NORVASC Take 1 tablet (5 mg  total) by mouth daily.   blood glucose meter kit and supplies Kit Dispense based on patient and insurance preference. Use up to four times daily as directed. (FOR ICD-9 250.00, 250.01).   doxycycline 100 MG capsule Commonly known as:  VIBRAMYCIN Take 1 capsule (100 mg total) by mouth 2 (two) times daily.   fenofibrate 160 MG tablet TAKE 1 TABLET BY MOUTH DAILY   furosemide 20 MG tablet Commonly known as:  LASIX Take 1 tablet (20 mg total) daily as needed by mouth.   glucose blood test strip Commonly known as:  ONE TOUCH ULTRA TEST Test up to tid. DX E11.9   olmesartan 40 MG tablet Commonly known as:  BENICAR Take 1 tablet (40 mg total) by mouth daily.   rivaroxaban 20 MG Tabs tablet Commonly known as:  XARELTO Take one tablet by mouth daily with food   triamcinolone 0.1 % cream : eucerin Crea Apply 1 application topically 2 (two) times daily.   vitamin B-12 1000 MCG tablet Commonly known as:  CYANOCOBALAMIN Take 1,000 mcg by mouth daily.          Objective:    BP 133/84   Pulse 84   Temp (!) 96.5 F (35.8 C) (Oral)   Ht '5\' 7"'  (1.702 m)   Wt 231 lb 3.2 oz (104.9 kg)   BMI 36.21 kg/m   No Known Allergies  Physical Exam  Constitutional: He appears well-developed and well-nourished.  HENT:  Head: Normocephalic and atraumatic.  Eyes: Conjunctivae and EOM are normal. Pupils are equal, round, and reactive to light.  Neck: Normal range of motion. Neck supple.  Cardiovascular: Normal rate, regular rhythm and normal heart sounds.  Pulses:      Dorsalis pedis pulses are 1+ on the right side, and 1+ on the left side.       Posterior tibial pulses are 1+ on the right side, and 1+ on the left side.  Pulmonary/Chest: Effort normal and breath sounds normal.  Abdominal: Soft. Bowel sounds are normal.  Musculoskeletal: Normal range of motion.  Skin: Skin is warm and dry. Rash noted. Rash is macular. There is erythema.       Results for orders placed or performed  during the hospital encounter of 07/10/17  Lactic acid, plasma  Result Value Ref Range   Lactic Acid, Venous 1.2 0.5 - 1.9 mmol/L  CBC with Differential  Result Value Ref Range   WBC 7.9 4.0 - 10.5 K/uL   RBC 4.46 4.22 - 5.81 MIL/uL   Hemoglobin 12.6 (L) 13.0 - 17.0 g/dL  HCT 39.8 39.0 - 52.0 %   MCV 89.2 78.0 - 100.0 fL   MCH 28.3 26.0 - 34.0 pg   MCHC 31.7 30.0 - 36.0 g/dL   RDW 14.1 11.5 - 15.5 %   Platelets 313 150 - 400 K/uL   Neutrophils Relative % 69 %   Neutro Abs 5.5 1.7 - 7.7 K/uL   Lymphocytes Relative 22 %   Lymphs Abs 1.7 0.7 - 4.0 K/uL   Monocytes Relative 7 %   Monocytes Absolute 0.5 0.1 - 1.0 K/uL   Eosinophils Relative 2 %   Eosinophils Absolute 0.1 0.0 - 0.7 K/uL   Basophils Relative 0 %   Basophils Absolute 0.0 0.0 - 0.1 K/uL  Comprehensive metabolic panel  Result Value Ref Range   Sodium 132 (L) 135 - 145 mmol/L   Potassium 4.3 3.5 - 5.1 mmol/L   Chloride 99 (L) 101 - 111 mmol/L   CO2 24 22 - 32 mmol/L   Glucose, Bld 116 (H) 65 - 99 mg/dL   BUN 28 (H) 6 - 20 mg/dL   Creatinine, Ser 1.69 (H) 0.61 - 1.24 mg/dL   Calcium 8.7 (L) 8.9 - 10.3 mg/dL   Total Protein 6.8 6.5 - 8.1 g/dL   Albumin 3.3 (L) 3.5 - 5.0 g/dL   AST 20 15 - 41 U/L   ALT 21 17 - 63 U/L   Alkaline Phosphatase 60 38 - 126 U/L   Total Bilirubin 0.6 0.3 - 1.2 mg/dL   GFR calc non Af Amer 38 (L) >60 mL/min   GFR calc Af Amer 44 (L) >60 mL/min   Anion gap 9 5 - 15  Protime-INR  Result Value Ref Range   Prothrombin Time 17.8 (H) 11.4 - 15.2 seconds   INR 1.48   APTT  Result Value Ref Range   aPTT 45 (H) 24 - 36 seconds  Heparin level (unfractionated)  Result Value Ref Range   Heparin Unfractionated >2.20 (H) 0.30 - 0.70 IU/mL  CBC  Result Value Ref Range   WBC 7.1 4.0 - 10.5 K/uL   RBC 4.18 (L) 4.22 - 5.81 MIL/uL   Hemoglobin 11.8 (L) 13.0 - 17.0 g/dL   HCT 36.9 (L) 39.0 - 52.0 %   MCV 88.3 78.0 - 100.0 fL   MCH 28.2 26.0 - 34.0 pg   MCHC 32.0 30.0 - 36.0 g/dL   RDW 14.1 11.5  - 15.5 %   Platelets 316 150 - 400 K/uL  Protime-INR  Result Value Ref Range   Prothrombin Time 14.8 11.4 - 15.2 seconds   INR 1.17   Comprehensive metabolic panel  Result Value Ref Range   Sodium 138 135 - 145 mmol/L   Potassium 4.4 3.5 - 5.1 mmol/L   Chloride 107 101 - 111 mmol/L   CO2 23 22 - 32 mmol/L   Glucose, Bld 122 (H) 65 - 99 mg/dL   BUN 27 (H) 6 - 20 mg/dL   Creatinine, Ser 1.52 (H) 0.61 - 1.24 mg/dL   Calcium 8.8 (L) 8.9 - 10.3 mg/dL   Total Protein 6.0 (L) 6.5 - 8.1 g/dL   Albumin 2.9 (L) 3.5 - 5.0 g/dL   AST 18 15 - 41 U/L   ALT 16 (L) 17 - 63 U/L   Alkaline Phosphatase 55 38 - 126 U/L   Total Bilirubin 0.5 0.3 - 1.2 mg/dL   GFR calc non Af Amer 43 (L) >60 mL/min   GFR calc Af Amer 50 (L) >60 mL/min  Anion gap 8 5 - 15  Antithrombin III  Result Value Ref Range   AntiThromb III Func 94 75 - 120 %  Protein C, total  Result Value Ref Range   Protein C, Total 101 60 - 150 %  Protein S activity  Result Value Ref Range   Protein S Activity 103 63 - 140 %  Protein S, total  Result Value Ref Range   Protein S Ag, Total 111 60 - 150 %  Lupus anticoagulant panel  Result Value Ref Range   PTT Lupus Anticoagulant 52.1 (H) 0.0 - 51.9 sec   DRVVT 124.7 (H) 0.0 - 47.0 sec   Lupus Anticoag Interp Comment:   Beta-2-glycoprotein i abs, IgG/M/A  Result Value Ref Range   Beta-2 Glyco I IgG <9 0 - 20 GPI IgG units   Beta-2-Glycoprotein I IgM <9 0 - 32 GPI IgM units   Beta-2-Glycoprotein I IgA <9 0 - 25 GPI IgA units  Homocysteine, serum  Result Value Ref Range   Homocysteine 14.4 0.0 - 15.0 umol/L  Factor 5 leiden  Result Value Ref Range   Recommendations-F5LEID: Comment   Prothrombin gene mutation  Result Value Ref Range   Recommendations-PTGENE: Comment   Cardiolipin antibodies, IgG, IgM, IgA  Result Value Ref Range   Anticardiolipin IgG <9 0 - 14 GPL U/mL   Anticardiolipin IgM <9 0 - 12 MPL U/mL   Anticardiolipin IgA 9 0 - 11 APL U/mL  Glucose, capillary    Result Value Ref Range   Glucose-Capillary 199 (H) 65 - 99 mg/dL   Comment 1 Notify RN    Comment 2 Document in Chart   Protein C activity  Result Value Ref Range   Protein C Activity 97 73 - 180 %  APTT  Result Value Ref Range   aPTT 64 (H) 24 - 36 seconds  Heparin level (unfractionated)  Result Value Ref Range   Heparin Unfractionated 0.92 (H) 0.30 - 0.70 IU/mL  APTT  Result Value Ref Range   aPTT 54 (H) 24 - 36 seconds  Heparin level (unfractionated)  Result Value Ref Range   Heparin Unfractionated 0.37 0.30 - 0.70 IU/mL  Glucose, capillary  Result Value Ref Range   Glucose-Capillary 113 (H) 65 - 99 mg/dL   Comment 1 Notify RN    Comment 2 Document in Chart   Glucose, capillary  Result Value Ref Range   Glucose-Capillary 140 (H) 65 - 99 mg/dL   Comment 1 Notify RN    Comment 2 Document in Chart   Glucose, capillary  Result Value Ref Range   Glucose-Capillary 119 (H) 65 - 99 mg/dL   Comment 1 Notify RN    Comment 2 Document in Chart   Glucose, capillary  Result Value Ref Range   Glucose-Capillary 107 (H) 65 - 99 mg/dL   Comment 1 Notify RN    Comment 2 Document in Chart   Glucose, capillary  Result Value Ref Range   Glucose-Capillary 121 (H) 65 - 99 mg/dL   Comment 1 Notify RN    Comment 2 Document in Chart   CBC  Result Value Ref Range   WBC 8.3 4.0 - 10.5 K/uL   RBC 4.23 4.22 - 5.81 MIL/uL   Hemoglobin 12.1 (L) 13.0 - 17.0 g/dL   HCT 37.4 (L) 39.0 - 52.0 %   MCV 88.4 78.0 - 100.0 fL   MCH 28.6 26.0 - 34.0 pg   MCHC 32.4 30.0 - 36.0 g/dL   RDW  14.0 11.5 - 15.5 %   Platelets 278 150 - 400 K/uL  Heparin level (unfractionated)  Result Value Ref Range   Heparin Unfractionated 0.38 0.30 - 0.70 IU/mL  Protime-INR  Result Value Ref Range   Prothrombin Time 13.5 11.4 - 15.2 seconds   INR 1.04   Glucose, capillary  Result Value Ref Range   Glucose-Capillary 148 (H) 65 - 99 mg/dL   Comment 1 Notify RN    Comment 2 Document in Chart   Glucose, capillary   Result Value Ref Range   Glucose-Capillary 122 (H) 65 - 99 mg/dL  APTT  Result Value Ref Range   aPTT 96 (H) 24 - 36 seconds  Glucose, capillary  Result Value Ref Range   Glucose-Capillary 103 (H) 65 - 99 mg/dL  CBC  Result Value Ref Range   WBC 8.7 4.0 - 10.5 K/uL   RBC 4.10 (L) 4.22 - 5.81 MIL/uL   Hemoglobin 11.7 (L) 13.0 - 17.0 g/dL   HCT 36.5 (L) 39.0 - 52.0 %   MCV 89.0 78.0 - 100.0 fL   MCH 28.5 26.0 - 34.0 pg   MCHC 32.1 30.0 - 36.0 g/dL   RDW 14.0 11.5 - 15.5 %   Platelets 289 150 - 400 K/uL  Glucose, capillary  Result Value Ref Range   Glucose-Capillary 92 65 - 99 mg/dL  Glucose, capillary  Result Value Ref Range   Glucose-Capillary 127 (H) 65 - 99 mg/dL  Glucose, capillary  Result Value Ref Range   Glucose-Capillary 109 (H) 65 - 99 mg/dL   Comment 1 Notify RN    Comment 2 Document in Chart   Glucose, capillary  Result Value Ref Range   Glucose-Capillary 102 (H) 65 - 99 mg/dL   Comment 1 Notify RN    Comment 2 Document in Chart   PTT-LA Mix  Result Value Ref Range   PTT-LA Mix 47.8 0.0 - 48.9 sec  PTT-LA Incub Mix  Result Value Ref Range   PTT-LA Incub Mix 55.3 (H) 0.0 - 48.9 sec  Hexagonal Phase Phospholipid  Result Value Ref Range   Hexagonal Phase Phospholipid 12 (H) 0 - 11 sec  dRVVT Mix  Result Value Ref Range   dRVVT Mix 72.5 (H) 0.0 - 47.0 sec  dRVVT Confirm  Result Value Ref Range   dRVVT Confirm 1.9 (H) 0.8 - 1.2 ratio  Glucose, capillary  Result Value Ref Range   Glucose-Capillary 122 (H) 65 - 99 mg/dL   Comment 1 Notify RN    Comment 2 Document in Chart   Glucose, capillary  Result Value Ref Range   Glucose-Capillary 129 (H) 65 - 99 mg/dL   Comment 1 Notify RN    Comment 2 Document in Chart   Glucose, capillary  Result Value Ref Range   Glucose-Capillary 123 (H) 65 - 99 mg/dL   Comment 1 Notify RN    Comment 2 Document in Chart   Glucose, capillary  Result Value Ref Range   Glucose-Capillary 123 (H) 65 - 99 mg/dL   Comment 1  Notify RN    Comment 2 Document in Chart       Assessment & Plan:   1. Recurrent deep vein thrombosis (DVT) (HCC) - CBC with Differential/Platelet - BMP8+EGFR  2. Cellulitis of right lower extremity - CBC with Differential/Platelet - BMP8+EGFR    Current Outpatient Medications:  .  amLODipine (NORVASC) 5 MG tablet, Take 1 tablet (5 mg total) by mouth daily., Disp: 90 tablet, Rfl: 3 .  blood glucose meter kit and supplies KIT, Dispense based on patient and insurance preference. Use up to four times daily as directed. (FOR ICD-9 250.00, 250.01)., Disp: 1 each, Rfl: 0 .  doxycycline (VIBRAMYCIN) 100 MG capsule, Take 1 capsule (100 mg total) by mouth 2 (two) times daily., Disp: 20 capsule, Rfl: 0 .  fenofibrate 160 MG tablet, TAKE 1 TABLET BY MOUTH DAILY (Patient taking differently: TAKE 1 TABLET (160 mg) BY MOUTH DAILY), Disp: 90 tablet, Rfl: 1 .  furosemide (LASIX) 20 MG tablet, Take 1 tablet (20 mg total) daily as needed by mouth., Disp: 30 tablet, Rfl: 2 .  glucose blood (ONE TOUCH ULTRA TEST) test strip, Test up to tid. DX E11.9, Disp: 100 each, Rfl: 2 .  olmesartan (BENICAR) 40 MG tablet, Take 1 tablet (40 mg total) by mouth daily., Disp: 90 tablet, Rfl: 0 .  rivaroxaban (XARELTO) 20 MG TABS tablet, Take one tablet by mouth daily with food (Patient taking differently: Take 20 mg by mouth every morning. Take one tablet by mouth daily with food), Disp: 30 tablet, Rfl: 11 .  Triamcinolone Acetonide (TRIAMCINOLONE 0.1 % CREAM : EUCERIN) CREA, Apply 1 application topically 2 (two) times daily., Disp: 30 each, Rfl: 1 .  vitamin B-12 (CYANOCOBALAMIN) 1000 MCG tablet, Take 1,000 mcg by mouth daily., Disp: , Rfl:  Continue all other maintenance medications as listed above.  Follow up plan: Return if symptoms worsen or fail to improve.  Educational handout given for [place] PA-C [place] [address]  [place], [place]  [id number] [phone]   07/19/2017, 2:28 PM

## 2017-07-19 NOTE — Patient Instructions (Signed)
In a few days you may receive a survey in the mail or online from [employer] regarding your visit with us today. Please take a moment to fill this out. Your feedback is very important to our whole office. It can help us better understand your needs as well as improve your experience and satisfaction. Thank you for taking your time to complete it. We care about you.  Thia Olesen, PA-C  

## 2017-07-20 ENCOUNTER — Telehealth: Payer: Self-pay | Admitting: Family Medicine

## 2017-07-20 ENCOUNTER — Encounter: Payer: Self-pay | Admitting: *Deleted

## 2017-07-20 LAB — BMP8+EGFR
BUN/Creatinine Ratio: 20 (ref 10–24)
BUN: 43 mg/dL — ABNORMAL HIGH (ref 8–27)
CO2: 18 mmol/L — AB (ref 20–29)
CREATININE: 2.11 mg/dL — AB (ref 0.76–1.27)
Calcium: 9.6 mg/dL (ref 8.6–10.2)
Chloride: 105 mmol/L (ref 96–106)
GFR calc Af Amer: 34 mL/min/{1.73_m2} — ABNORMAL LOW (ref >59)
GFR, EST NON AFRICAN AMERICAN: 30 mL/min/{1.73_m2} — AB (ref >59)
Glucose: 181 mg/dL — ABNORMAL HIGH (ref 65–99)
POTASSIUM: 5 mmol/L (ref 3.5–5.2)
Sodium: 139 mmol/L (ref 134–144)

## 2017-07-20 LAB — CBC WITH DIFFERENTIAL/PLATELET
BASOS: 1 %
Basophils Absolute: 0.1 10*3/uL (ref 0.0–0.2)
EOS (ABSOLUTE): 0.1 10*3/uL (ref 0.0–0.4)
EOS: 1 %
HEMATOCRIT: 39.9 % (ref 37.5–51.0)
HEMOGLOBIN: 13.6 g/dL (ref 13.0–17.7)
IMMATURE GRANS (ABS): 0 10*3/uL (ref 0.0–0.1)
Immature Granulocytes: 0 %
LYMPHS ABS: 2.9 10*3/uL (ref 0.7–3.1)
Lymphs: 28 %
MCH: 29.2 pg (ref 26.6–33.0)
MCHC: 34.1 g/dL (ref 31.5–35.7)
MCV: 86 fL (ref 79–97)
Monocytes Absolute: 0.7 10*3/uL (ref 0.1–0.9)
Monocytes: 7 %
NEUTROS ABS: 6.7 10*3/uL (ref 1.4–7.0)
Neutrophils: 63 %
Platelets: 281 10*3/uL (ref 150–379)
RBC: 4.65 x10E6/uL (ref 4.14–5.80)
RDW: 15.8 % — AB (ref 12.3–15.4)
WBC: 10.5 10*3/uL (ref 3.4–10.8)

## 2017-07-20 NOTE — Telephone Encounter (Signed)
Letter faxed, pt aware

## 2017-07-20 NOTE — Telephone Encounter (Signed)
Okay to do?

## 2017-08-10 ENCOUNTER — Other Ambulatory Visit: Payer: Medicare Other

## 2017-08-10 DIAGNOSIS — N2581 Secondary hyperparathyroidism of renal origin: Secondary | ICD-10-CM | POA: Diagnosis not present

## 2017-08-10 DIAGNOSIS — N189 Chronic kidney disease, unspecified: Secondary | ICD-10-CM | POA: Diagnosis not present

## 2017-08-10 DIAGNOSIS — N183 Chronic kidney disease, stage 3 (moderate): Secondary | ICD-10-CM | POA: Diagnosis not present

## 2017-08-21 DIAGNOSIS — I129 Hypertensive chronic kidney disease with stage 1 through stage 4 chronic kidney disease, or unspecified chronic kidney disease: Secondary | ICD-10-CM | POA: Diagnosis not present

## 2017-08-21 DIAGNOSIS — D631 Anemia in chronic kidney disease: Secondary | ICD-10-CM | POA: Diagnosis not present

## 2017-08-21 DIAGNOSIS — N183 Chronic kidney disease, stage 3 (moderate): Secondary | ICD-10-CM | POA: Diagnosis not present

## 2017-08-21 DIAGNOSIS — N2581 Secondary hyperparathyroidism of renal origin: Secondary | ICD-10-CM | POA: Diagnosis not present

## 2017-10-04 ENCOUNTER — Ambulatory Visit (INDEPENDENT_AMBULATORY_CARE_PROVIDER_SITE_OTHER): Payer: Medicare Other | Admitting: Family Medicine

## 2017-10-04 ENCOUNTER — Encounter: Payer: Self-pay | Admitting: Family Medicine

## 2017-10-04 ENCOUNTER — Ambulatory Visit (INDEPENDENT_AMBULATORY_CARE_PROVIDER_SITE_OTHER): Payer: Medicare Other

## 2017-10-04 ENCOUNTER — Encounter: Payer: Self-pay | Admitting: *Deleted

## 2017-10-04 ENCOUNTER — Telehealth: Payer: Self-pay | Admitting: Family Medicine

## 2017-10-04 VITALS — BP 136/88 | HR 69 | Temp 96.6°F | Ht 67.0 in | Wt 241.0 lb

## 2017-10-04 DIAGNOSIS — E7800 Pure hypercholesterolemia, unspecified: Secondary | ICD-10-CM

## 2017-10-04 DIAGNOSIS — C439 Malignant melanoma of skin, unspecified: Secondary | ICD-10-CM

## 2017-10-04 DIAGNOSIS — D638 Anemia in other chronic diseases classified elsewhere: Secondary | ICD-10-CM | POA: Diagnosis not present

## 2017-10-04 DIAGNOSIS — I82409 Acute embolism and thrombosis of unspecified deep veins of unspecified lower extremity: Secondary | ICD-10-CM

## 2017-10-04 DIAGNOSIS — R918 Other nonspecific abnormal finding of lung field: Secondary | ICD-10-CM | POA: Diagnosis not present

## 2017-10-04 DIAGNOSIS — E559 Vitamin D deficiency, unspecified: Secondary | ICD-10-CM

## 2017-10-04 DIAGNOSIS — E1151 Type 2 diabetes mellitus with diabetic peripheral angiopathy without gangrene: Secondary | ICD-10-CM | POA: Diagnosis not present

## 2017-10-04 DIAGNOSIS — C61 Malignant neoplasm of prostate: Secondary | ICD-10-CM

## 2017-10-04 DIAGNOSIS — N183 Chronic kidney disease, stage 3 unspecified: Secondary | ICD-10-CM

## 2017-10-04 DIAGNOSIS — E119 Type 2 diabetes mellitus without complications: Secondary | ICD-10-CM | POA: Diagnosis not present

## 2017-10-04 DIAGNOSIS — I739 Peripheral vascular disease, unspecified: Secondary | ICD-10-CM | POA: Diagnosis not present

## 2017-10-04 DIAGNOSIS — R911 Solitary pulmonary nodule: Secondary | ICD-10-CM

## 2017-10-04 DIAGNOSIS — I70209 Unspecified atherosclerosis of native arteries of extremities, unspecified extremity: Secondary | ICD-10-CM | POA: Diagnosis not present

## 2017-10-04 DIAGNOSIS — I10 Essential (primary) hypertension: Secondary | ICD-10-CM

## 2017-10-04 LAB — BAYER DCA HB A1C WAIVED: HB A1C (BAYER DCA - WAIVED): 6 % (ref <7.0)

## 2017-10-04 NOTE — Telephone Encounter (Signed)
Please give note to patient as requested by his wife.  I did not see this request until after the patient had left the office.

## 2017-10-04 NOTE — Patient Instructions (Addendum)
Medicare Annual Wellness Visit  [facility] and the medical providers at [facility] strive to bring you the best medical care.  In doing so we not only want to address your current medical conditions and concerns but also to detect new conditions early and prevent illness, disease and health-related problems.    Medicare offers a yearly Wellness Visit which allows our clinical staff to assess your need for preventative services including immunizations, lifestyle education, counseling to decrease risk of preventable diseases and screening for fall risk and other medical concerns.    This visit is provided free of charge (no copay) for all Medicare recipients. The clinical pharmacists at [facility] have begun to conduct these Wellness Visits which will also include a thorough review of all your medications.    As you primary medical provider recommend that you make an appointment for your Annual Wellness Visit if you have not done so already this year.  You may set up this appointment before you leave today or you may call back ([phone]) and schedule an appointment.  Please make sure when you call that you mention that you are scheduling your Annual Wellness Visit with the clinical pharmacist so that the appointment may be made for the proper length of time.     Continue current medications. Continue good therapeutic lifestyle changes which include good diet and exercise. Fall precautions discussed with patient. If an FOBT was given today- please return it to our front desk. If you are over 22 years old - you may need Prevnar 39 or the adult Pneumonia vaccine.  **Flu shots are available--- please call and schedule a FLU-CLINIC appointment**  After your visit with [name] today you will receive a survey in the mail or online from [employer] regarding your care with [name]. Please take a moment to fill this out. Your feedback is very  important to [name] as you can help [name] better understand your patient needs as well as improve your experience and satisfaction. WE CARE ABOUT YOU!!!   Follow-up regularly with urology nephrology dermatology and do not forget to schedule visit for colonoscopy

## 2017-10-04 NOTE — Telephone Encounter (Signed)
Note ready = pt aware

## 2017-10-04 NOTE — Telephone Encounter (Signed)
Patient has appointment with provider today.  Please address at that time.

## 2017-10-04 NOTE — Progress Notes (Signed)
Subjective:    Patient ID: Ricardo Mayer., male    DOB: 1940/09/27, 77 y.o.   MRN: [id number]  HPI Pt here for follow up and management of chronic medical problems which includes diabetes, hypertension and hyperlipidemia. He is taking medication regularly.  The patient is doing well today with no specific complaints.  He is due for a colonoscopy and has a letter to call and set this visit.  He is also due to get a chest x-ray and due to get some lab work.  He did have his flu shot at [place].  He is on Xarelto because of pulmonary emboli.  He is followed regularly by the nephrologist.  The patient is also followed by the urologist.  He sees the nephrologist and the urologist every 6 months.  His last visit with the urologist was about 3 months ago.  He is due to get a repeat colonoscopy and has to call and get this scheduled.  He denies any chest pain or shortness of breath.  He denies any trouble with his stomach including nausea vomiting diarrhea blood in the stool or black tarry bowel movements.  He is due however to get a colonoscopy as already mentioned.  He plans to take care of this.  He has frequency but no burning.  He has seen the pulmonologist and the cardiologist in the past but has no plans for follow-up with these doctors.  His vital signs today have a weight that is 241 and this appears to be increased from his previous visit.  He says that his fasting blood sugars at home have been running around 140.  He does not check them during the day.  He did have a trip to the emergency room in December concerned about swelling and redness in his leg.  He was placed on antibiotic and this cleared up without problems.  He does wear support hose daily.     Patient Active Problem List   Diagnosis Date Noted  . Cellulitis 07/12/2017  . Recurrent deep vein thrombosis (DVT) ([place]) 07/10/2017  . Localized edema 07/07/2017  . Pure hypercholesterolemia 07/13/2016  . Essential hypertension 07/13/2016    . Vitamin D deficiency 07/13/2016  . Morbid obesity ([place]) 05/27/2016  . Anemia of chronic disease   . LOC (loss of consciousness) ([place]) 04/08/2016  . TIA (transient ischemic attack) 04/08/2016  . 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 S/P knee replacement 03/21/2016  . Anemia associated with chronic renal failure 10/28/2015  . Hyperparathyroidism , secondary, non-renal ([place]) 10/28/2015  . Pulmonary emboli ([place]) 08/27/2015  . Near syncope 08/25/2015  . DOE (dyspnea on exertion) 06/02/2015  . Type 2 diabetes mellitus with vascular disease ([place]) 06/02/2015  . Melanoma of skin ([place]) 10/09/2013  . 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 Chronic kidney disease, stage III (moderate) ([place]) 10/09/2013  . Peripheral vascular insufficiency ([place]) 10/09/2013  . Statin intolerance 10/09/2013  . Prostate cancer ([place]) 12/25/2012   Outpatient Encounter Medications as of 10/04/2017  Medication Sig  . amLODipine (NORVASC) 5 MG tablet Take 1 tablet (5 mg total) by mouth daily.  . blood glucose meter kit and supplies KIT Dispense based on patient and insurance preference. Use up to four times daily as directed. (FOR ICD-9 250.00, 250.01).  . fenofibrate 160 MG tablet TAKE 1 TABLET BY MOUTH DAILY (Patient taking differently: TAKE 1 TABLET (160 mg) BY MOUTH DAILY)  . furosemide (LASIX) 20 MG tablet Take 1  tablet (20 mg total) daily as needed by mouth.  Marland Kitchen glucose blood (ONE TOUCH ULTRA TEST) test strip Test up to tid. DX E11.9  . olmesartan (BENICAR) 40 MG tablet Take 1 tablet (40 mg total) by mouth daily.  . rivaroxaban (XARELTO) 20 MG TABS tablet Take one tablet by mouth daily with food (Patient taking differently: Take 20 mg by mouth every morning. Take one tablet by mouth daily with food)  . Triamcinolone Acetonide (TRIAMCINOLONE 0.1 % CREAM : EUCERIN) CREA Apply 1 application topically 2 (two) times daily.  . vitamin B-12 (CYANOCOBALAMIN) 1000 MCG tablet Take 1,000 mcg  by mouth daily.  . [DISCONTINUED] doxycycline (VIBRAMYCIN) 100 MG capsule Take 1 capsule (100 mg total) by mouth 2 (two) times daily.   No facility-administered encounter medications on file as of 10/04/2017.      Review of Systems  Constitutional: Negative.   HENT: Negative.   Eyes: Negative.   Respiratory: Negative.   Cardiovascular: Negative.   Gastrointestinal: Negative.   Endocrine: Negative.   Genitourinary: Negative.   Musculoskeletal: Negative.   Skin: Negative.   Allergic/Immunologic: Negative.   Neurological: Negative.   Hematological: Negative.   Psychiatric/Behavioral: Negative.        Objective:   Physical Exam  Constitutional: He is oriented to person, place, and time. He appears well-developed and well-nourished. No distress.  The patient is pleasant and alert  HENT:  Head: Normocephalic and atraumatic.  Right Ear: External ear normal.  Left Ear: External ear normal.  Nose: Nose normal.  Mouth/Throat: Oropharynx is clear and moist. No oropharyngeal exudate.  Eyes: Conjunctivae and EOM are normal. Pupils are equal, round, and reactive to light. Right eye exhibits no discharge. Left eye exhibits no discharge. No scleral icterus.  Neck: Normal range of motion. Neck supple. No thyromegaly present.  No bruits thyromegaly or anterior cervical adenopathy  Cardiovascular: Normal rate, regular rhythm and normal heart sounds.  No murmur heard. Heart is regular today at 72/min, diminished pedal pulses in left foot greater than right foot.  Pulmonary/Chest: Effort normal and breath sounds normal. No respiratory distress. He has no wheezes. He has no rales. He exhibits no tenderness.  Clear anteriorly and posteriorly and no axillary adenopathy  Abdominal: Soft. Bowel sounds are normal. He exhibits no mass. There is no tenderness. There is no rebound and no guarding.  Abdominal obesity without liver or spleen enlargement masses or bruits.  Genitourinary:  Genitourinary  Comments: Patient sees urologist about every 6 months.  Musculoskeletal: Normal range of motion. He exhibits edema. He exhibits no tenderness.  Nonpitting edema both lower extremities.  Patient is not wearing support stockings today because of office visit.  Lymphadenopathy:    He has no cervical adenopathy.  Neurological: He is alert and oriented to person, place, and time. He has normal reflexes. No cranial nerve deficit.  Skin: Skin is warm and dry. No rash noted.  Psychiatric: He has a normal mood and affect. His behavior is normal. Judgment and thought content normal.  Nursing note and vitals reviewed.  BP (!) 141/69 (BP Location: Left Arm)   Pulse 69   Temp (!) 96.6 F (35.9 C) (Oral)   Ht '5\' 7"'  (1.702 m)   Wt 241 lb (109.3 kg)   BMI 37.75 kg/m         Assessment & Plan:  1. Controlled type 2 diabetes mellitus without complication, without long-term current use of insulin ([place]) -The patient will continue with his current treatment pending results of  lab work along with aggressive therapeutic lifestyle changes - CBC with Differential/Platelet - BMP8+EGFR - Bayer DCA Hb A1c Waived  2. Chronic kidney disease, stage III (moderate) (H[facility]) -Continue to follow-up every 6 months with Dr. Posey Pronto, his nephrologist. - CBC with Differential/Platelet - BMP8+EGFR - DG Chest 2 View; Future  3. Pure hypercholesterolemia -Continue current treatment pending results of lab work as well as aggressive therapeutic lifestyle changes - CBC with Differential/Platelet - Lipid panel - DG Chest 2 View; Future  4. Essential hypertension -The blood pressure is good today and he will continue with current treatment - CBC with Differential/Platelet - BMP8+EGFR - Hepatic function panel - DG Chest 2 View; Future  5. Vitamin D deficiency -Continue with vitamin D replacement pending results of lab work - CBC with Differential/Platelet - VITAMIN D 25 Hydroxy (Vit-D Deficiency, Fractures)  6.  Prostate cancer ([place]) -Continue follow-up with urology - CBC with Differential/Platelet  7. Morbid obesity ([place]) -Continue to work aggressively on therapeutic lifestyle changes  8. Anemia of chronic disease -Follow-up with nephrology  9. Melanoma of skin ([place]) -Follow-up with dermatology as planned  10. Peripheral vascular insufficiency (HCC) -PRN follow-up with cardiology  11. 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 Recurrent deep vein thrombosis (DVT) (H[facility]) -Continue to monitor this closely and get in touch with [name] if any develop of redness or fever in the legs.  Continue with support hose regularly.  Patient Instructions                       Medicare Annual Wellness Visit  [facility] and the medical providers at [facility] strive to bring you the best medical care.  In doing so we not only want to address your current medical conditions and concerns but also to detect new conditions early and prevent illness, disease and health-related problems.    Medicare offers a yearly Wellness Visit which allows our clinical staff to assess your need for preventative services including immunizations, lifestyle education, counseling to decrease risk of preventable diseases and screening for fall risk and other medical concerns.    This visit is provided free of charge (no copay) for all Medicare recipients. The clinical pharmacists at [facility] have begun to conduct these Wellness Visits which will also include a thorough review of all your medications.    As you primary medical provider recommend that you make an appointment for your Annual Wellness Visit if you have not done so already this year.  You may set up this appointment before you leave today or you may call back ([phone]) and schedule an  appointment.  Please make sure when you call that you mention that you are scheduling your Annual Wellness Visit with the clinical pharmacist so that the appointment may be made for the proper length of time.     Continue current medications. Continue good therapeutic lifestyle changes which include good diet and exercise. Fall precautions discussed with patient. If an FOBT was given today- please return it to our front desk. If you are over 53 years old - you may need Prevnar 47 or the adult Pneumonia vaccine.  **Flu shots are available--- please call and schedule a FLU-CLINIC appointment**  After your visit with [name] today you will receive a survey in the mail or online from [employer] regarding your care with [name]. Please  take a moment to fill this out. Your feedback is very important to [name] as you can help [name] better understand your patient needs as well as improve your experience and satisfaction. WE CARE ABOUT YOU!!!     Arrie Senate MD

## 2017-10-04 NOTE — Addendum Note (Signed)
Addended by: Zannie Cove on: 10/04/2017 12:29 PM   Modules accepted: Orders

## 2017-10-05 ENCOUNTER — Telehealth: Payer: Self-pay | Admitting: Family Medicine

## 2017-10-05 LAB — VITAMIN D 25 HYDROXY (VIT D DEFICIENCY, FRACTURES): VIT D 25 HYDROXY: 35.5 ng/mL (ref 30.0–100.0)

## 2017-10-05 LAB — CBC WITH DIFFERENTIAL/PLATELET
BASOS: 0 %
Basophils Absolute: 0 10*3/uL (ref 0.0–0.2)
EOS (ABSOLUTE): 0.1 10*3/uL (ref 0.0–0.4)
EOS: 2 %
HEMATOCRIT: 42.4 % (ref 37.5–51.0)
Hemoglobin: 14.6 g/dL (ref 13.0–17.7)
Immature Grans (Abs): 0 10*3/uL (ref 0.0–0.1)
Immature Granulocytes: 0 %
LYMPHS ABS: 2.4 10*3/uL (ref 0.7–3.1)
Lymphs: 30 %
MCH: 29.9 pg (ref 26.6–33.0)
MCHC: 34.4 g/dL (ref 31.5–35.7)
MCV: 87 fL (ref 79–97)
Monocytes Absolute: 0.6 10*3/uL (ref 0.1–0.9)
Monocytes: 7 %
NEUTROS ABS: 5.1 10*3/uL (ref 1.4–7.0)
Neutrophils: 61 %
Platelets: 174 10*3/uL (ref 150–379)
RBC: 4.88 x10E6/uL (ref 4.14–5.80)
RDW: 15.9 % — AB (ref 12.3–15.4)
WBC: 8.2 10*3/uL (ref 3.4–10.8)

## 2017-10-05 LAB — LIPID PANEL
CHOL/HDL RATIO: 2.8 ratio (ref 0.0–5.0)
Cholesterol, Total: 105 mg/dL (ref 100–199)
HDL: 37 mg/dL — ABNORMAL LOW (ref >39)
LDL Calculated: 48 mg/dL (ref 0–99)
Triglycerides: 101 mg/dL (ref 0–149)
VLDL Cholesterol Cal: 20 mg/dL (ref 5–40)

## 2017-10-05 LAB — BMP8+EGFR
BUN / CREAT RATIO: 16 (ref 10–24)
BUN: 29 mg/dL — ABNORMAL HIGH (ref 8–27)
CALCIUM: 9.2 mg/dL (ref 8.6–10.2)
CO2: 22 mmol/L (ref 20–29)
Chloride: 105 mmol/L (ref 96–106)
Creatinine, Ser: 1.85 mg/dL — ABNORMAL HIGH (ref 0.76–1.27)
GFR, EST AFRICAN AMERICAN: 40 mL/min/{1.73_m2} — AB (ref >59)
GFR, EST NON AFRICAN AMERICAN: 35 mL/min/{1.73_m2} — AB (ref >59)
Glucose: 125 mg/dL — ABNORMAL HIGH (ref 65–99)
POTASSIUM: 4.8 mmol/L (ref 3.5–5.2)
SODIUM: 141 mmol/L (ref 134–144)

## 2017-10-05 LAB — HEPATIC FUNCTION PANEL
ALBUMIN: 4.2 g/dL (ref 3.5–4.8)
ALT: 15 IU/L (ref 0–44)
AST: 15 IU/L (ref 0–40)
Alkaline Phosphatase: 71 IU/L (ref 39–117)
Bilirubin Total: 0.4 mg/dL (ref 0.0–1.2)
Bilirubin, Direct: 0.11 mg/dL (ref 0.00–0.40)
Total Protein: 6.4 g/dL (ref 6.0–8.5)

## 2017-10-13 ENCOUNTER — Ambulatory Visit (HOSPITAL_COMMUNITY)
Admission: RE | Admit: 2017-10-13 | Discharge: 2017-10-13 | Disposition: A | Discharge status: Home / Self Care | Payer: Medicare Other | Source: Ambulatory Visit | Attending: Family Medicine | Admitting: Family Medicine

## 2017-10-13 DIAGNOSIS — R918 Other nonspecific abnormal finding of lung field: Secondary | ICD-10-CM | POA: Diagnosis not present

## 2017-10-13 DIAGNOSIS — K760 Fatty (change of) liver, not elsewhere classified: Secondary | ICD-10-CM | POA: Insufficient documentation

## 2017-10-13 DIAGNOSIS — R911 Solitary pulmonary nodule: Secondary | ICD-10-CM

## 2017-10-13 DIAGNOSIS — I2510 Atherosclerotic heart disease of native coronary artery without angina pectoris: Secondary | ICD-10-CM | POA: Diagnosis not present

## 2017-10-20 ENCOUNTER — Ambulatory Visit (HOSPITAL_COMMUNITY): Payer: Medicare Other

## 2017-11-02 ENCOUNTER — Ambulatory Visit: Payer: Self-pay | Admitting: Family Medicine

## 2017-11-08 NOTE — Progress Notes (Signed)
Cardiology Office Note   Date:  11/09/2017   ID:  Ricardo Mayer., DOB September 18, 1940, MRN [id number]  PCP:  [name], MD    No chief complaint on file.  Coronary calcium on CT scan  Wt Readings from Last 3 Encounters:  11/09/17 247 lb 1.9 oz (112.1 kg)  10/04/17 241 lb (109.3 kg)  07/19/17 231 lb 3.2 oz (104.9 kg)       History of Present Illness: Ricardo Mayer. is a 77 y.o. male who has diabetes and hypertension.  He was seen in 2016 for worsening shortness of breath.  He has chronic renal insufficiency and there was some hesitation to perform cardiac catheterization due to possibility of renal complication.  Echocardiogram showed normal left ventricular function.  In 2016, he also had a nuclear stress test showing no ischemia.  Based on these tests, no invasive testing was done.  He was found to have a DVT/PE and this was the cause of his shortness of breath.  He had a chest CT in 2019 showing 3 vessel coronary calcifications.  He continues to work.  He walks at work a fair bit.  No chest discomfort.  Denies : Chest pain. Dizziness. Leg edema. Nitroglycerin use. Orthopnea. Palpitations. Paroxysmal nocturnal dyspnea. Shortness of breath. Syncope.    Activity is limited by knee pain.  He had one knee replacement, but is holding off on the other.   Past Medical History:  Diagnosis Date  . Adenomatous polyp of colon   . Arthritis    bilateral knees  . CKD (chronic kidney disease), stage II    Dr. Patel,nephrology "stable last 3 yrs"  . Diabetes mellitus without complication ([place])    Dr. Laurance Flatten.PCP  . History of basal cell cancer   . History of DVT of lower extremity    2011--  LEFT LEG  . History of kidney stones   . History of melanoma excision    RIGHT ARM  . Hyperlipidemia   . Hypertension   . Impaired hearing    hearing aids bilateral  . Melanoma ([place]) 1990   arm and back -Melanoma, basal cell others- Dr. Edgardo Roys follows.  . Near syncope  08/25/2015   Dr. Roland Rack once"found blood clots in lung"  . Nocturia   . Prostate cancer [facility]) 2014   Dr. Cherylann Parr- "seed implant" '14  . Ulcer of left lower leg ([place])    recent pcp visit dr Laurance Flatten--  ordered betadine wet/dry dsg daily (03-18-2013)  [place] IN 1 WEEK    Past Surgical History:  Procedure Laterality Date  . KIDNEY STONE EXTRACTIONS  LAST ONE 1970   X2 OPEN/  X1  URETEROSCOPY  . MELANOMA EXCISION RIGHT ARM  1990  . PROSTATE BIOPSY    . RADIOACTIVE SEED IMPLANT N/A 03/21/2013   Procedure: RADIOACTIVE SEED IMPLANT;  Surgeon: Malka So, MD;  Location: [facility];  Service: Urology;  Laterality: N/A;  87 seeds implanted    . TONSILLECTOMY    . TOTAL KNEE ARTHROPLASTY Right 03/21/2016   Procedure: RIGHT TOTAL KNEE ARTHROPLASTY;  Surgeon: Paralee Cancel, MD;  Location: [facility];  Service: Orthopedics;  Laterality: Right;     Current Outpatient Medications  Medication Sig Dispense Refill  . amLODipine (NORVASC) 5 MG tablet Take 1 tablet (5 mg total) by mouth daily. 90 tablet 3  . blood glucose meter kit and supplies KIT Dispense based on patient and insurance preference. Use up  to four times daily as directed. (FOR ICD-9 250.00, 250.01). 1 each 0  . furosemide (LASIX) 20 MG tablet Take 1 tablet (20 mg total) daily as needed by mouth. 30 tablet 2  . glucose blood (ONE TOUCH ULTRA TEST) test strip Test up to tid. DX E11.9 100 each 2  . olmesartan (BENICAR) 40 MG tablet Take 1 tablet (40 mg total) by mouth daily. 90 tablet 0  . rivaroxaban (XARELTO) 20 MG TABS tablet Take one tablet by mouth daily with food (Patient taking differently: Take 20 mg by mouth every morning. Take one tablet by mouth daily with food) 30 tablet 11  . vitamin B-12 (CYANOCOBALAMIN) 1000 MCG tablet Take 1,000 mcg by mouth daily.     No current facility-administered medications for this visit.     Allergies:   Patient has no known allergies.    Social  History:  The patient  reports that he has never smoked. He has never used smokeless tobacco. He reports that he does not drink alcohol or use drugs.   Family History:  The patient's family history includes Breast cancer in his mother; Cancer in his paternal grandfather and paternal uncle; Esophageal cancer in his brother; Hypertension in his mother; Lung cancer in his father; Stroke in his mother; Ulcers in his father.    ROS:  Please see the history of present illness.   Otherwise, review of systems are positive for knee pain.   All other systems are reviewed and negative.    PHYSICAL EXAM: VS:  BP 122/84   Pulse 81   Ht 5' 7" (1.702 m)   Wt 247 lb 1.9 oz (112.1 kg)   SpO2 97%   BMI 38.70 kg/m  , BMI Body mass index is 38.7 kg/m. GEN: Well nourished, well developed, in no acute distress  HEENT: normal  Neck: no JVD, carotid bruits, or masses Cardiac: RRR; no murmurs, rubs, or gallops,no edema  Respiratory:  clear to auscultation bilaterally, normal work of breathing GI: soft, nontender,  + BS, obese MS: no deformity or atrophy  Skin: warm and dry, no rash Neuro:  Strength and sensation are intact Psych: euthymic mood, full affect   EKG:   The ekg ordered today demonstrates NSR, LVH, nonspecific ST changes   Recent Labs: 05/04/2017: TSH 2.450 10/04/2017: ALT 15; BUN 29; Creatinine, Ser 1.85; Hemoglobin 14.6; Platelets 174; Potassium 4.8; Sodium 141   Lipid Panel    Component Value Date/Time   CHOL 105 10/04/2017 0956   TRIG 101 10/04/2017 0956   TRIG 185 (H) 11/22/2016 1007   HDL 37 (L) 10/04/2017 0956   HDL 35 (L) 11/22/2016 1007   CHOLHDL 2.8 10/04/2017 0956   CHOLHDL 2.2 04/09/2016 0455   VLDL 17 04/09/2016 0455   LDLCALC 48 10/04/2017 0956   LDLDIRECT 21 03/05/2013 1100     Other studies Reviewed: Additional studies/ records that were reviewed today with results demonstrating: 2016 stress test reviewed.   ASSESSMENT AND PLAN:  1. Coronary artery calcium:  Given his age and risk factors, I suspect he does have some degree of coronary artery disease.  At this time, he has no symptoms consistent with angina.  We discussed further testing.  He did have a negative stress test in 2016.  Given his lack of symptoms, and his renal insufficiency, I would prefer to avoid any contrast dye exposure.  One option to intensify his medical therapy would be adding lipid-lowering therapy.  He was on fenofibrate in the past.  This was stopped because he felt like it was not helping him.  The other option would be to add a statin.  Low-dose would likely be sufficient.  I will defer to Dr. Laurance Flatten as to whether or not he would like to use either fenofibrate or atorvastatin 10 mg daily.  The patient states he had elevated particle numbers in the past and that is why he was on the fenofibrate.  Hopefully, more intense lipid-lowering therapy would prevent progression of atherosclerosis.  Diabetes: 2. PE: He is maintained on Xarelto.  No bleeding issues reported. 3. A1c 6.0 in February 2019.  Diabetes well controlled. 4. HTN: The current medical regimen is effective;  continue present plan and medications. 5. Intensify preventive therapy including healthy diet, maximizing exercise and weight loss given the evidence of coronary artery calcium.   Current medicines are reviewed at length with the patient today.  The patient concerns regarding his medicines were addressed.  The following changes have been made: Possibly adding fenofibrate versus atorvastatin  Labs/ tests ordered today include:  No orders of the defined types were placed in this encounter.   Recommend 150 minutes/week of aerobic exercise Low fat, low carb, high fiber diet recommended  Disposition:   FU as needed   Signed, Larae Grooms, MD  11/09/2017 10:31 AM    [facility] [address], [place], [place]  [address] Phone: [phone]; Fax: [phone]

## 2017-11-09 ENCOUNTER — Ambulatory Visit (INDEPENDENT_AMBULATORY_CARE_PROVIDER_SITE_OTHER): Payer: Medicare Other | Admitting: Interventional Cardiology

## 2017-11-09 ENCOUNTER — Encounter: Payer: Self-pay | Admitting: Interventional Cardiology

## 2017-11-09 VITALS — BP 122/84 | HR 81 | Ht 67.0 in | Wt 247.1 lb

## 2017-11-09 DIAGNOSIS — E1151 Type 2 diabetes mellitus with diabetic peripheral angiopathy without gangrene: Secondary | ICD-10-CM | POA: Diagnosis not present

## 2017-11-09 DIAGNOSIS — E1159 Type 2 diabetes mellitus with other circulatory complications: Secondary | ICD-10-CM

## 2017-11-09 DIAGNOSIS — I10 Essential (primary) hypertension: Secondary | ICD-10-CM

## 2017-11-09 DIAGNOSIS — I70209 Unspecified atherosclerosis of native arteries of extremities, unspecified extremity: Secondary | ICD-10-CM | POA: Diagnosis not present

## 2017-11-09 DIAGNOSIS — I2699 Other pulmonary embolism without acute cor pulmonale: Secondary | ICD-10-CM | POA: Diagnosis not present

## 2017-11-09 DIAGNOSIS — I2510 Atherosclerotic heart disease of native coronary artery without angina pectoris: Secondary | ICD-10-CM

## 2017-11-09 NOTE — Patient Instructions (Signed)
Medication Instructions:  Your physician recommends that you continue on your current medications as directed. Please refer to the Current Medication list given to you today.   Labwork: None ordered  Testing/Procedures: None ordered  Follow-Up: Your physician wants you to follow-up with Dr. Irish Lack AS NEEDED   Any Other Special Instructions Will Be Listed Below (If Applicable).     If you need a refill on your cardiac medications before your next appointment, please call your pharmacy.

## 2017-11-10 NOTE — Progress Notes (Signed)
The note from the cardiologist was reviewed.  Please start this patient on atorvastatin 10 mg 1 daily at bedtime #30 refill as needed

## 2017-11-13 NOTE — Progress Notes (Signed)
LM for pt to call - 4/1-jhb

## 2017-11-14 NOTE — Progress Notes (Signed)
LM for pt to call back about meds -4/2-jhb

## 2017-11-15 ENCOUNTER — Telehealth: Payer: Self-pay | Admitting: Family Medicine

## 2017-11-15 MED ORDER — ATORVASTATIN CALCIUM 10 MG PO TABS: 10.0000 mg | ORAL_TABLET | Freq: Every day | ORAL | 3 refills | Status: DC

## 2017-11-15 NOTE — Telephone Encounter (Signed)
Per [facility] request and DR Irish Lack note - pt is to be started on lipitor 10 mg qd --- I left pt a detailed VM and med sent to [facility]

## 2017-11-15 NOTE — Telephone Encounter (Signed)
Patient states that Roselyn Reef called him yesterday to talk about his visit with Dr. Beau Fanny- please call patient back.

## 2017-11-28 NOTE — Progress Notes (Signed)
Taken care of in different encounter.  This encounter will now be closed  

## 2017-12-18 ENCOUNTER — Telehealth: Payer: Self-pay | Admitting: Family Medicine

## 2017-12-18 MED ORDER — DOXYCYCLINE HYCLATE 100 MG PO TABS: 100.0000 mg | ORAL_TABLET | Freq: Two times a day (BID) | ORAL | 0 refills | Status: DC

## 2017-12-18 NOTE — Telephone Encounter (Signed)
LM with wife to find out more info 5/6-jhb

## 2017-12-18 NOTE — Telephone Encounter (Signed)
What symptoms do you have? Tick bite  How long have you been sick? Yesterday  Have you been seen for this problem? no  If your provider decides to give you a prescription, which pharmacy would you like for it to be sent to? [facility]   Patient informed that this information will be sent to the clinical staff for review and that they should receive a follow up call.  3

## 2017-12-18 NOTE — Telephone Encounter (Signed)
Sorry - error MMM

## 2017-12-18 NOTE — Telephone Encounter (Signed)
Deer tick - per wife --- med sent in

## 2017-12-18 NOTE — Telephone Encounter (Signed)
Please find out from the patient what kind of tick bite this was and make sure he is not allergic to anything and have him come and get titers for Lyme and [facility] spotted fever and treat with doxycycline 100 mg twice daily for 2 to 3 weeks with food

## 2017-12-19 ENCOUNTER — Telehealth: Payer: Self-pay | Admitting: Family Medicine

## 2017-12-19 MED ORDER — DOXYCYCLINE HYCLATE 100 MG PO TABS: 100.0000 mg | ORAL_TABLET | Freq: Two times a day (BID) | ORAL | 0 refills | Status: DC

## 2017-12-19 NOTE — Telephone Encounter (Signed)
Patient's wife aware that prescription has been sent to [facility].  [facility] called to cancel.

## 2018-01-22 ENCOUNTER — Telehealth: Payer: Self-pay | Admitting: Family Medicine

## 2018-01-22 DIAGNOSIS — R899 Unspecified abnormal finding in specimens from other organs, systems and tissues: Secondary | ICD-10-CM

## 2018-01-22 DIAGNOSIS — R9389 Abnormal findings on diagnostic imaging of other specified body structures: Secondary | ICD-10-CM

## 2018-01-22 DIAGNOSIS — J9811 Atelectasis: Secondary | ICD-10-CM

## 2018-01-22 NOTE — Telephone Encounter (Signed)
Pt is rtn

## 2018-01-22 NOTE — Telephone Encounter (Signed)
3 mo follow up CT chest ordered

## 2018-01-22 NOTE — Telephone Encounter (Signed)
lmtcb

## 2018-01-24 ENCOUNTER — Telehealth: Payer: Self-pay | Admitting: Family Medicine

## 2018-01-24 NOTE — Telephone Encounter (Signed)
Aware of CT appt

## 2018-01-24 NOTE — Telephone Encounter (Signed)
LM 6/12-jhb

## 2018-02-01 ENCOUNTER — Other Ambulatory Visit: Payer: Self-pay | Admitting: *Deleted

## 2018-02-01 ENCOUNTER — Other Ambulatory Visit: Payer: Medicare Other

## 2018-02-01 DIAGNOSIS — N183 Chronic kidney disease, stage 3 unspecified: Secondary | ICD-10-CM

## 2018-02-01 DIAGNOSIS — C61 Malignant neoplasm of prostate: Secondary | ICD-10-CM | POA: Diagnosis not present

## 2018-02-01 DIAGNOSIS — E7800 Pure hypercholesterolemia, unspecified: Secondary | ICD-10-CM

## 2018-02-01 DIAGNOSIS — E559 Vitamin D deficiency, unspecified: Secondary | ICD-10-CM

## 2018-02-01 DIAGNOSIS — R5383 Other fatigue: Secondary | ICD-10-CM

## 2018-02-01 DIAGNOSIS — E119 Type 2 diabetes mellitus without complications: Secondary | ICD-10-CM

## 2018-02-01 DIAGNOSIS — I10 Essential (primary) hypertension: Secondary | ICD-10-CM

## 2018-02-01 LAB — URINALYSIS, COMPLETE
BILIRUBIN UA: NEGATIVE
GLUCOSE, UA: NEGATIVE
KETONES UA: NEGATIVE
Leukocytes, UA: NEGATIVE
NITRITE UA: NEGATIVE
SPEC GRAV UA: 1.025 (ref 1.005–1.030)
UUROB: 0.2 mg/dL (ref 0.2–1.0)
pH, UA: 6 (ref 5.0–7.5)

## 2018-02-01 LAB — MICROSCOPIC EXAMINATION
Bacteria, UA: NONE SEEN
Renal Epithel, UA: NONE SEEN /hpf

## 2018-02-01 LAB — BAYER DCA HB A1C WAIVED: HB A1C (BAYER DCA - WAIVED): 6.3 % (ref <7.0)

## 2018-02-02 LAB — BMP8+EGFR
BUN/Creatinine Ratio: 15 (ref 10–24)
BUN: 28 mg/dL — ABNORMAL HIGH (ref 8–27)
CALCIUM: 8.9 mg/dL (ref 8.6–10.2)
CO2: 18 mmol/L — AB (ref 20–29)
CREATININE: 1.87 mg/dL — AB (ref 0.76–1.27)
Chloride: 105 mmol/L (ref 96–106)
GFR calc Af Amer: 39 mL/min/{1.73_m2} — ABNORMAL LOW (ref >59)
GFR, EST NON AFRICAN AMERICAN: 34 mL/min/{1.73_m2} — AB (ref >59)
Glucose: 142 mg/dL — ABNORMAL HIGH (ref 65–99)
Potassium: 5 mmol/L (ref 3.5–5.2)
Sodium: 140 mmol/L (ref 134–144)

## 2018-02-02 LAB — CBC WITH DIFFERENTIAL/PLATELET
BASOS ABS: 0 10*3/uL (ref 0.0–0.2)
Basos: 0 %
EOS (ABSOLUTE): 0.1 10*3/uL (ref 0.0–0.4)
Eos: 1 %
HEMOGLOBIN: 13.5 g/dL (ref 13.0–17.7)
Hematocrit: 40.2 % (ref 37.5–51.0)
IMMATURE GRANULOCYTES: 0 %
Immature Grans (Abs): 0 10*3/uL (ref 0.0–0.1)
LYMPHS ABS: 2.1 10*3/uL (ref 0.7–3.1)
LYMPHS: 28 %
MCH: 29.3 pg (ref 26.6–33.0)
MCHC: 33.6 g/dL (ref 31.5–35.7)
MCV: 87 fL (ref 79–97)
Monocytes Absolute: 0.6 10*3/uL (ref 0.1–0.9)
Monocytes: 8 %
NEUTROS PCT: 63 %
Neutrophils Absolute: 4.8 10*3/uL (ref 1.4–7.0)
Platelets: 169 10*3/uL (ref 150–450)
RBC: 4.6 x10E6/uL (ref 4.14–5.80)
RDW: 15.5 % — AB (ref 12.3–15.4)
WBC: 7.7 10*3/uL (ref 3.4–10.8)

## 2018-02-02 LAB — LIPID PANEL
CHOLESTEROL TOTAL: 99 mg/dL — AB (ref 100–199)
Chol/HDL Ratio: 3.1 ratio (ref 0.0–5.0)
HDL: 32 mg/dL — ABNORMAL LOW (ref >39)
LDL Calculated: 43 mg/dL (ref 0–99)
Triglycerides: 120 mg/dL (ref 0–149)
VLDL CHOLESTEROL CAL: 24 mg/dL (ref 5–40)

## 2018-02-02 LAB — PSA, TOTAL AND FREE: PSA, Free: <0.01 ng/mL

## 2018-02-02 LAB — HEPATIC FUNCTION PANEL
ALBUMIN: 3.9 g/dL (ref 3.5–4.8)
ALT: 13 IU/L (ref 0–44)
AST: 14 IU/L (ref 0–40)
Alkaline Phosphatase: 74 IU/L (ref 39–117)
BILIRUBIN TOTAL: 0.5 mg/dL (ref 0.0–1.2)
Bilirubin, Direct: 0.14 mg/dL (ref 0.00–0.40)
Total Protein: 6 g/dL (ref 6.0–8.5)

## 2018-02-02 LAB — VITAMIN D 25 HYDROXY (VIT D DEFICIENCY, FRACTURES): VIT D 25 HYDROXY: 26.9 ng/mL — AB (ref 30.0–100.0)

## 2018-02-06 ENCOUNTER — Ambulatory Visit (HOSPITAL_COMMUNITY)
Admission: RE | Admit: 2018-02-06 | Discharge: 2018-02-06 | Disposition: A | Discharge status: Home / Self Care | Payer: Medicare Other | Source: Ambulatory Visit | Attending: Family Medicine | Admitting: Family Medicine

## 2018-02-06 DIAGNOSIS — J9811 Atelectasis: Secondary | ICD-10-CM | POA: Diagnosis not present

## 2018-02-06 DIAGNOSIS — R9389 Abnormal findings on diagnostic imaging of other specified body structures: Secondary | ICD-10-CM | POA: Diagnosis not present

## 2018-02-06 DIAGNOSIS — R899 Unspecified abnormal finding in specimens from other organs, systems and tissues: Secondary | ICD-10-CM

## 2018-02-06 DIAGNOSIS — R918 Other nonspecific abnormal finding of lung field: Secondary | ICD-10-CM | POA: Diagnosis not present

## 2018-02-07 ENCOUNTER — Telehealth: Payer: Self-pay | Admitting: *Deleted

## 2018-02-07 DIAGNOSIS — R9389 Abnormal findings on diagnostic imaging of other specified body structures: Secondary | ICD-10-CM

## 2018-02-07 NOTE — Telephone Encounter (Signed)
Pt's wife notified of report Verbalizes understanding Pt wants to proceed with referral Referral entered in Epic Pt prefers AM appt Will not be available from 03/04/2018 thru 04/02/2018

## 2018-02-09 ENCOUNTER — Encounter: Payer: Self-pay | Admitting: Family Medicine

## 2018-02-09 ENCOUNTER — Ambulatory Visit (INDEPENDENT_AMBULATORY_CARE_PROVIDER_SITE_OTHER): Payer: Medicare Other | Admitting: Family Medicine

## 2018-02-09 ENCOUNTER — Ambulatory Visit (INDEPENDENT_AMBULATORY_CARE_PROVIDER_SITE_OTHER): Payer: Medicare Other | Admitting: Urology

## 2018-02-09 VITALS — BP 147/74 | HR 71 | Temp 96.7°F | Ht 67.0 in | Wt 248.0 lb

## 2018-02-09 DIAGNOSIS — N183 Chronic kidney disease, stage 3 unspecified: Secondary | ICD-10-CM

## 2018-02-09 DIAGNOSIS — I82409 Acute embolism and thrombosis of unspecified deep veins of unspecified lower extremity: Secondary | ICD-10-CM

## 2018-02-09 DIAGNOSIS — I10 Essential (primary) hypertension: Secondary | ICD-10-CM | POA: Diagnosis not present

## 2018-02-09 DIAGNOSIS — E119 Type 2 diabetes mellitus without complications: Secondary | ICD-10-CM

## 2018-02-09 DIAGNOSIS — C61 Malignant neoplasm of prostate: Secondary | ICD-10-CM | POA: Diagnosis not present

## 2018-02-09 DIAGNOSIS — Z8546 Personal history of malignant neoplasm of prostate: Secondary | ICD-10-CM | POA: Diagnosis not present

## 2018-02-09 DIAGNOSIS — C439 Malignant melanoma of skin, unspecified: Secondary | ICD-10-CM | POA: Diagnosis not present

## 2018-02-09 DIAGNOSIS — E7800 Pure hypercholesterolemia, unspecified: Secondary | ICD-10-CM | POA: Diagnosis not present

## 2018-02-09 DIAGNOSIS — E1151 Type 2 diabetes mellitus with diabetic peripheral angiopathy without gangrene: Secondary | ICD-10-CM

## 2018-02-09 DIAGNOSIS — D692 Other nonthrombocytopenic purpura: Secondary | ICD-10-CM

## 2018-02-09 DIAGNOSIS — E1159 Type 2 diabetes mellitus with other circulatory complications: Secondary | ICD-10-CM | POA: Diagnosis not present

## 2018-02-09 DIAGNOSIS — E559 Vitamin D deficiency, unspecified: Secondary | ICD-10-CM

## 2018-02-09 DIAGNOSIS — R3915 Urgency of urination: Secondary | ICD-10-CM | POA: Diagnosis not present

## 2018-02-09 DIAGNOSIS — I739 Peripheral vascular disease, unspecified: Secondary | ICD-10-CM

## 2018-02-09 DIAGNOSIS — R351 Nocturia: Secondary | ICD-10-CM

## 2018-02-09 DIAGNOSIS — R142 Eructation: Secondary | ICD-10-CM | POA: Diagnosis not present

## 2018-02-09 DIAGNOSIS — I70209 Unspecified atherosclerosis of native arteries of extremities, unspecified extremity: Secondary | ICD-10-CM

## 2018-02-09 NOTE — Patient Instructions (Addendum)
Medicare Annual Wellness Visit  [facility] and the medical providers at [facility] strive to bring you the best medical care.  In doing so we not only want to address your current medical conditions and concerns but also to detect new conditions early and prevent illness, disease and health-related problems.    Medicare offers a yearly Wellness Visit which allows our clinical staff to assess your need for preventative services including immunizations, lifestyle education, counseling to decrease risk of preventable diseases and screening for fall risk and other medical concerns.    This visit is provided free of charge (no copay) for all Medicare recipients. The clinical pharmacists at [facility] have begun to conduct these Wellness Visits which will also include a thorough review of all your medications.    As you primary medical provider recommend that you make an appointment for your Annual Wellness Visit if you have not done so already this year.  You may set up this appointment before you leave today or you may call back ([phone]) and schedule an appointment.  Please make sure when you call that you mention that you are scheduling your Annual Wellness Visit with the clinical pharmacist so that the appointment may be made for the proper length of time.     Continue current medications. Continue good therapeutic lifestyle changes which include good diet and exercise. Fall precautions discussed with patient. If an FOBT was given today- please return it to our front desk. If you are over 70 years old - you may need Prevnar 68 or the adult Pneumonia vaccine.  **Flu shots are available--- please call and schedule a FLU-CLINIC appointment**  After your visit with [name] today you will receive a survey in the mail or online from [employer] regarding your care with [name]. Please take a moment to fill this out. Your feedback is very  important to [name] as you can help [name] better understand your patient needs as well as improve your experience and satisfaction. WE CARE ABOUT YOU!!!   Follow-up with urology as planned Follow-up with nephrology as planned Continue to drink plenty of fluids and stay well-hydrated Try some Zantac 150 or its generic equivalent ranitidine 150, the equate brand that can be purchased at [facility] and take this 1 twice a day before breakfast and supper for the next 2 to 3 weeks and call my nurse and let me know if the belching is helped by doing this.

## 2018-02-09 NOTE — Progress Notes (Signed)
Subjective:    Patient ID: Ricardo Huguenin., male    DOB: Feb 13, 1941, 77 y.o.   MRN: [id number]  HPI Pt here for follow up and management of chronic medical problems which includes diabetes and hypertension. He is taking medication regularly.  The patient is doing well overall with several ongoing health issues.  He has recurrent DVT hyperal lipidemia hypertension vitamin D deficiency and morbid obesity along with chronic kidney disease.  He sees Dr. Posey Pronto as his nephrologist.  He has peripheral vascular insufficiency and is status post a right knee replacement he also has had prostate cancer.  The previous CT scan done just recently on the 25th will be reviewed with the patient which shows an area of chronic pulmonary infarction secondary to the extensive right lower lobe pulmonary emboli that he had had on the scan and January 2017.  The radiologist said no further follow-up is recommended.  The patient's lab work has been returned and it will be reviewed with him during the visit today.  The cholesterol was 99 the LDL C cholesterol was 43 and the good cholesterol is low at 32.  Triglycerides were good at 120.  The urinalysis on dipstick had 3+ protein and negative for ketones and sugar.  Hemoglobin A1c was good at 6.3% slightly up from 4 months ago but still in the okay range.  The urinalysis microscopic was clear of infection and red blood cells.  The PSA remains less than 0.1.  The CBC had a normal white blood cell count with a good hemoglobin at 13.5 and an adequate platelet count.  The vitamin D level was decreased at 26.9 and previously had been within the normal range.  The blood sugar was elevated at 142 and the creatinine remains elevated as it has been in the past at about 1.87 which is lower than it was about 6 months ago.  All of the electrolytes are good including sodium potassium and chloride.  All liver function tests were normal.  She is pleasant and relaxed and a good historian.  He has a  visit scheduled with his urologist this afternoon to follow-up on his prostate cancer he will take a copy of the blood work with him to that visit to has his PSA number on that.  The patient denies any chest pain pressure or tightness.  He does have some shortness of breath but no worse than usual and this is especially noticed with bending over because of his weight.  He denies any change in his bowel habits including nausea vomiting diarrhea blood in the stool or trouble swallowing.  He has some milk and dairy products but not that much and he is noticed this increased problem with belching for about 1 month.  We will go ahead and have him try some ranitidine or Zantac over-the-counter twice daily for the next 2 to 3 weeks and have him call us back after he is use that and let us know if the belching is better.  He will also watch his milk cheese ice cream and dairy products more closely.  He is passing his water well but just has frequency and urgency because of his prostate has been removed.   Patient Active Problem List   Diagnosis Date Noted  . Cellulitis 07/12/2017  . Recurrent deep vein thrombosis (DVT) ([place]) 07/10/2017  . Localized edema 07/07/2017  . Pure hypercholesterolemia 07/13/2016  . Essential hypertension 07/13/2016  . Vitamin D deficiency 07/13/2016  . Morbid obesity (  [place]) 05/27/2016  . Anemia of chronic disease   . LOC (loss of consciousness) ([place]) 04/08/2016  . TIA (transient ischemic attack) 04/08/2016  . 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 S/P knee replacement 03/21/2016  . Anemia associated with chronic renal failure 10/28/2015  . Hyperparathyroidism , secondary, non-renal ([place]) 10/28/2015  . Pulmonary emboli ([place]) 08/27/2015  . Near syncope 08/25/2015  . DOE (dyspnea on exertion) 06/02/2015  . Type 2 diabetes mellitus with vascular disease ([place]) 06/02/2015  . Melanoma of skin ([place]) 10/09/2013  . 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 Chronic kidney disease, stage III (moderate) ([place]) 10/09/2013  . Peripheral vascular insufficiency ([place]) 10/09/2013  . Statin intolerance 10/09/2013  . Prostate cancer ([place]) 12/25/2012   Outpatient Encounter Medications as of 02/09/2018  Medication Sig  . amLODipine (NORVASC) 5 MG tablet Take 1 tablet (5 mg total) by mouth daily.  Marland Kitchen atorvastatin (LIPITOR) 10 MG tablet Take 1 tablet (10 mg total) by mouth daily.  . blood glucose meter kit and supplies KIT Dispense based on patient and insurance preference. Use up to four times daily as directed. (FOR ICD-9 250.00, 250.01).  . furosemide (LASIX) 20 MG tablet Take 1 tablet (20 mg total) daily as needed by mouth.  Marland Kitchen glucose blood (ONE TOUCH ULTRA TEST) test strip Test up to tid. DX E11.9  . olmesartan (BENICAR) 40 MG tablet Take 1 tablet (40 mg total) by mouth daily.  . rivaroxaban (XARELTO) 20 MG TABS tablet Take one tablet by mouth daily with food (Patient taking differently: Take 20 mg by mouth every morning. Take one tablet by mouth daily with food)  . vitamin B-12 (CYANOCOBALAMIN) 1000 MCG tablet Take 1,000 mcg by mouth daily.  . [DISCONTINUED] doxycycline (VIBRA-TABS) 100 MG tablet Take 1 tablet (100 mg total) by mouth 2 (two) times daily. 1 po bid   No facility-administered encounter medications on file as of 02/09/2018.       Review of Systems  Constitutional: Negative.   HENT: Negative.   Eyes: Negative.   Respiratory: Negative.   Cardiovascular: Negative.   Gastrointestinal: Negative.        Belching  Endocrine: Negative.   Genitourinary: Negative.   Musculoskeletal: Negative.   Skin: Negative.   Allergic/Immunologic: Negative.   Neurological: Negative.   Hematological: Negative.   Psychiatric/Behavioral: Negative.        Objective:   Physical Exam  Constitutional: He is oriented to person, place, and time. He appears well-developed and well-nourished. No distress.  The patient is pleasant and relaxed and plans to  see his urologist this afternoon  HENT:  Head: Normocephalic and atraumatic.  Right Ear: External ear normal.  Left Ear: External ear normal.  Nose: Nose normal.  Mouth/Throat: Oropharynx is clear and moist. No oropharyngeal exudate.  Eyes: Pupils are equal, round, and reactive to light. Conjunctivae and EOM are normal. Right eye exhibits no discharge. Left eye exhibits no discharge. No scleral icterus.  Up-to-date on eye exams  Neck: Normal range of motion. Neck supple. No tracheal deviation present. No thyromegaly present.  No bruits thyromegaly or anterior cervical adenopathy detected  Cardiovascular: Normal rate, regular rhythm and normal heart sounds.  No murmur heard. Heart is regular today at 72/min.  It was difficult to palpate pulses in the left lower extremity. palpate in the left lower extremity  Pulmonary/Chest: Effort normal and breath sounds normal. No respiratory distress. He has no wheezes. He has no rales. He exhibits no  tenderness.  clear ant and post  Abdominal: Soft. Bowel sounds are normal. He exhibits no mass. There is no tenderness. There is no guarding.  Musculoskeletal: Normal range of motion. He exhibits edema and deformity. He exhibits no tenderness.  Lymphadenopathy:    He has no cervical adenopathy.  Neurological: He is alert and oriented to person, place, and time. He has normal reflexes. No cranial nerve deficit.  Skin: Skin is warm and dry. Rash noted. There is erythema. No pallor.  Chronic dvt and cellulitis  Psychiatric: He has a normal mood and affect. His behavior is normal. Judgment and thought content normal.  Nursing note and vitals reviewed.  BP (!) 147/74 (BP Location: Left Arm)   Pulse 71   Temp (!) 96.7 F (35.9 C) (Oral)   Ht 5' 7" (1.702 m)   Wt 248 lb (112.5 kg)   SpO2 97%   BMI 38.84 kg/m         Assessment & Plan:  1. Controlled type 2 diabetes mellitus without complication, without long-term current use of insulin (HCC) -The  patient's hemoglobin A1c was good he will continue with his current treatment regimen and with as much exercise and diet input as possible  2. Chronic kidney disease, stage III (moderate) (HCC) -Continue to follow-up with Dr. Posey Pronto every 6 months  3. Essential hypertension -Blood pressure readings at home have been in the 1 30-1 40 range and the 60 range and he will continue to monitor this and doing this more regularly.  4. Vitamin D deficiency -Continue with vitamin D3 1000 units daily  5. Prostate cancer [facility]) -Follow-up with urology as planned and that visit is this afternoon.  6. Pure hypercholesterolemia -Continue with cholesterol treatment and therapeutic lifestyle changes as cholesterol numbers were excellent other than his good cholesterol being low.  7. Melanoma of skin ([place]) -Follow-up with dermatology as planned  8. Peripheral vascular insufficiency (HCC) -Claudication symptoms were explained to the patient he is currently not having any of these but he understands to let us know if he develops any calf pain with walking  9. Recurrent deep vein thrombosis (DVT) (HCC) -Continue with Xarelto and support stockings  10. Type 2 diabetes mellitus with vascular disease (HCC) -Decreased pedal pulses were in the left lower extremity and foot.  11. Senile purpura ([place]) -The increased bruising was noted on both arms.  12. Belching symptom -Patient will try ranitidine or Zantac 150 twice daily before breakfast and supper and will call us back for progress with this in 2 to 3 weeks  Patient Instructions                       Medicare Annual Wellness Visit  [facility] and the medical providers at [facility] strive to bring you the best medical care.  In doing so we not only want to address your current medical conditions and concerns but also to detect new conditions early and prevent illness, disease and health-related problems.    Medicare offers a  yearly Wellness Visit which allows our clinical staff to assess your need for preventative services including immunizations, lifestyle education, counseling to decrease risk of preventable diseases and screening for fall risk and other medical concerns.    This visit is provided free of charge (no copay) for all Medicare recipients. The clinical pharmacists at [facility] have begun to conduct these Wellness Visits which will also include a thorough review of all your  medications.    As you primary medical provider recommend that you make an appointment for your Annual Wellness Visit if you have not done so already this year.  You may set up this appointment before you leave today or you may call back ([phone]) and schedule an appointment.  Please make sure when you call that you mention that you are scheduling your Annual Wellness Visit with the clinical pharmacist so that the appointment may be made for the proper length of time.     Continue current medications. Continue good therapeutic lifestyle changes which include good diet and exercise. Fall precautions discussed with patient. If an FOBT was given today- please return it to our front desk. If you are over 45 years old - you may need Prevnar 51 or the adult Pneumonia vaccine.  **Flu shots are available--- please call and schedule a FLU-CLINIC appointment**  After your visit with [name] today you will receive a survey in the mail or online from [employer] regarding your care with [name]. Please take a moment to fill this out. Your feedback is very important to [name] as you can help [name] better understand your patient needs as well as improve your experience and satisfaction. WE CARE ABOUT YOU!!!   Follow-up with urology as planned Follow-up with nephrology as planned Continue to drink plenty of fluids and stay well-hydrated Try some Zantac 150 or its generic equivalent ranitidine 150, the equate brand that can be purchased at  [facility] and take this 1 twice a day before breakfast and supper for the next 2 to 3 weeks and call my nurse and let me know if the belching is helped by doing this.  Arrie Senate MD

## 2018-02-16 ENCOUNTER — Telehealth: Payer: Self-pay | Admitting: Family Medicine

## 2018-02-16 NOTE — Telephone Encounter (Signed)
That is correct. Ricardo Mayer aware

## 2018-02-25 ENCOUNTER — Other Ambulatory Visit: Payer: Self-pay | Admitting: Family Medicine

## 2018-04-06 LAB — HM DIABETES EYE EXAM

## 2018-04-23 ENCOUNTER — Other Ambulatory Visit: Payer: Medicare Other

## 2018-04-23 ENCOUNTER — Telehealth: Payer: Self-pay

## 2018-04-23 DIAGNOSIS — N183 Chronic kidney disease, stage 3 (moderate): Secondary | ICD-10-CM | POA: Diagnosis not present

## 2018-04-23 DIAGNOSIS — N2581 Secondary hyperparathyroidism of renal origin: Secondary | ICD-10-CM | POA: Diagnosis not present

## 2018-04-23 NOTE — Telephone Encounter (Signed)
Patient is seeing dermatology on 9/25 for a place on the side of his face that they feel like will be removed. Should they stop his xarelto the day before just in case? Please review and advise

## 2018-04-23 NOTE — Telephone Encounter (Signed)
The patient could leave the Xarelto off the day of if the appointment is in the afternoon or the night before if the appointment is in the early morning.

## 2018-04-23 NOTE — Telephone Encounter (Signed)
Pt wife aware

## 2018-04-30 DIAGNOSIS — N183 Chronic kidney disease, stage 3 (moderate): Secondary | ICD-10-CM | POA: Diagnosis not present

## 2018-04-30 DIAGNOSIS — N2581 Secondary hyperparathyroidism of renal origin: Secondary | ICD-10-CM | POA: Diagnosis not present

## 2018-04-30 DIAGNOSIS — D631 Anemia in chronic kidney disease: Secondary | ICD-10-CM | POA: Diagnosis not present

## 2018-04-30 DIAGNOSIS — I129 Hypertensive chronic kidney disease with stage 1 through stage 4 chronic kidney disease, or unspecified chronic kidney disease: Secondary | ICD-10-CM | POA: Diagnosis not present

## 2018-05-02 ENCOUNTER — Telehealth: Payer: Self-pay | Admitting: Family Medicine

## 2018-05-02 MED ORDER — OSELTAMIVIR PHOSPHATE 75 MG PO CAPS: 75.0000 mg | ORAL_CAPSULE | Freq: Every day | ORAL | 0 refills | Status: DC

## 2018-05-02 NOTE — Telephone Encounter (Signed)
Please start Tamiflu 75 mg 1 daily for 10 days

## 2018-05-02 NOTE — Telephone Encounter (Signed)
Left detailed message regarding RX 

## 2018-05-08 ENCOUNTER — Other Ambulatory Visit: Payer: Medicare Other

## 2018-05-08 DIAGNOSIS — N183 Chronic kidney disease, stage 3 (moderate): Secondary | ICD-10-CM | POA: Diagnosis not present

## 2018-05-08 DIAGNOSIS — Z23 Encounter for immunization: Secondary | ICD-10-CM | POA: Diagnosis not present

## 2018-05-10 ENCOUNTER — Other Ambulatory Visit: Payer: Self-pay | Admitting: Physician Assistant

## 2018-05-10 DIAGNOSIS — D229 Melanocytic nevi, unspecified: Secondary | ICD-10-CM | POA: Diagnosis not present

## 2018-05-10 DIAGNOSIS — D485 Neoplasm of uncertain behavior of skin: Secondary | ICD-10-CM | POA: Diagnosis not present

## 2018-05-10 DIAGNOSIS — Q828 Other specified congenital malformations of skin: Secondary | ICD-10-CM | POA: Diagnosis not present

## 2018-05-10 DIAGNOSIS — L820 Inflamed seborrheic keratosis: Secondary | ICD-10-CM | POA: Diagnosis not present

## 2018-06-07 ENCOUNTER — Other Ambulatory Visit: Payer: Self-pay | Admitting: *Deleted

## 2018-06-07 ENCOUNTER — Other Ambulatory Visit: Payer: Medicare Other

## 2018-06-07 DIAGNOSIS — I739 Peripheral vascular disease, unspecified: Secondary | ICD-10-CM | POA: Diagnosis not present

## 2018-06-07 DIAGNOSIS — E1159 Type 2 diabetes mellitus with other circulatory complications: Secondary | ICD-10-CM | POA: Diagnosis not present

## 2018-06-07 DIAGNOSIS — D631 Anemia in chronic kidney disease: Secondary | ICD-10-CM

## 2018-06-07 DIAGNOSIS — E7800 Pure hypercholesterolemia, unspecified: Secondary | ICD-10-CM

## 2018-06-07 DIAGNOSIS — N183 Chronic kidney disease, stage 3 unspecified: Secondary | ICD-10-CM

## 2018-06-07 DIAGNOSIS — N189 Chronic kidney disease, unspecified: Secondary | ICD-10-CM | POA: Diagnosis not present

## 2018-06-07 LAB — BAYER DCA HB A1C WAIVED: HB A1C: 6.7 % (ref <7.0)

## 2018-06-08 LAB — LIPID PANEL
CHOL/HDL RATIO: 3.1 ratio (ref 0.0–5.0)
Cholesterol, Total: 123 mg/dL (ref 100–199)
HDL: 40 mg/dL (ref >39)
LDL Calculated: 55 mg/dL (ref 0–99)
Triglycerides: 139 mg/dL (ref 0–149)
VLDL Cholesterol Cal: 28 mg/dL (ref 5–40)

## 2018-06-08 LAB — CBC WITH DIFFERENTIAL/PLATELET
BASOS: 0 %
Basophils Absolute: 0 10*3/uL (ref 0.0–0.2)
EOS (ABSOLUTE): 0.2 10*3/uL (ref 0.0–0.4)
Eos: 3 %
HEMATOCRIT: 44.7 % (ref 37.5–51.0)
HEMOGLOBIN: 14.8 g/dL (ref 13.0–17.7)
IMMATURE GRANS (ABS): 0 10*3/uL (ref 0.0–0.1)
Immature Granulocytes: 0 %
Lymphocytes Absolute: 2.5 10*3/uL (ref 0.7–3.1)
Lymphs: 32 %
MCH: 29.1 pg (ref 26.6–33.0)
MCHC: 33.1 g/dL (ref 31.5–35.7)
MCV: 88 fL (ref 79–97)
MONOCYTES: 9 %
MONOS ABS: 0.7 10*3/uL (ref 0.1–0.9)
NEUTROS PCT: 56 %
Neutrophils Absolute: 4.3 10*3/uL (ref 1.4–7.0)
Platelets: 185 10*3/uL (ref 150–450)
RBC: 5.08 x10E6/uL (ref 4.14–5.80)
RDW: 14.5 % (ref 12.3–15.4)
WBC: 7.7 10*3/uL (ref 3.4–10.8)

## 2018-06-08 LAB — HEPATIC FUNCTION PANEL
ALK PHOS: 69 IU/L (ref 39–117)
ALT: 16 IU/L (ref 0–44)
AST: 15 IU/L (ref 0–40)
Albumin: 4 g/dL (ref 3.5–4.8)
BILIRUBIN, DIRECT: 0.11 mg/dL (ref 0.00–0.40)
Bilirubin Total: 0.4 mg/dL (ref 0.0–1.2)
Total Protein: 5.9 g/dL — ABNORMAL LOW (ref 6.0–8.5)

## 2018-06-08 LAB — VITAMIN D 25 HYDROXY (VIT D DEFICIENCY, FRACTURES): Vit D, 25-Hydroxy: 24 ng/mL — ABNORMAL LOW (ref 30.0–100.0)

## 2018-06-08 LAB — BMP8+EGFR
BUN/Creatinine Ratio: 18 (ref 10–24)
BUN: 33 mg/dL — AB (ref 8–27)
CHLORIDE: 105 mmol/L (ref 96–106)
CO2: 23 mmol/L (ref 20–29)
CREATININE: 1.82 mg/dL — AB (ref 0.76–1.27)
Calcium: 9.5 mg/dL (ref 8.6–10.2)
GFR calc Af Amer: 41 mL/min/{1.73_m2} — ABNORMAL LOW (ref >59)
GFR calc non Af Amer: 35 mL/min/{1.73_m2} — ABNORMAL LOW (ref >59)
GLUCOSE: 145 mg/dL — AB (ref 65–99)
Potassium: 5.2 mmol/L (ref 3.5–5.2)
Sodium: 140 mmol/L (ref 134–144)

## 2018-06-13 ENCOUNTER — Telehealth: Payer: Self-pay | Admitting: *Deleted

## 2018-06-13 NOTE — Telephone Encounter (Signed)
-----   Message from [name], MD sent at 06/13/2018 11:34 AM EDT ----- Arloa Koh go ahead and start metformin 250 mg 1 daily.  Patient should monitor blood sugars closely and watch diet more closely.  We need to be sure and send his nephrologist a note regarding the starting of metformin 250 mg daily.  Also would like for the patient to come by in 2 to 3 weeks and get a repeat BMP. ----- Message ----- From: Zannie Cove, LPN Sent: 34/48/3015  11:03 AM EDT To: [name], MD  [facility] [place],  It is up to the clinician to use a low dose of metformin when the GFR is between 30-45 ml/min.  A safe alternative is glimepiride 1mg /day starting dose and adjust as needed.  Thanks  [employer]

## 2018-06-13 NOTE — Telephone Encounter (Signed)
We need to discuss new metformin 250 - LM for pt to call me back on Fri 11/1-jhb

## 2018-06-15 ENCOUNTER — Ambulatory Visit (INDEPENDENT_AMBULATORY_CARE_PROVIDER_SITE_OTHER): Payer: Medicare Other | Admitting: Family Medicine

## 2018-06-15 ENCOUNTER — Encounter: Payer: Self-pay | Admitting: Family Medicine

## 2018-06-15 VITALS — BP 173/84 | HR 75 | Temp 96.7°F | Ht 67.0 in | Wt 243.0 lb

## 2018-06-15 DIAGNOSIS — E559 Vitamin D deficiency, unspecified: Secondary | ICD-10-CM | POA: Diagnosis not present

## 2018-06-15 DIAGNOSIS — E1159 Type 2 diabetes mellitus with other circulatory complications: Secondary | ICD-10-CM | POA: Diagnosis not present

## 2018-06-15 DIAGNOSIS — N183 Chronic kidney disease, stage 3 unspecified: Secondary | ICD-10-CM

## 2018-06-15 DIAGNOSIS — D692 Other nonthrombocytopenic purpura: Secondary | ICD-10-CM | POA: Diagnosis not present

## 2018-06-15 DIAGNOSIS — E1151 Type 2 diabetes mellitus with diabetic peripheral angiopathy without gangrene: Secondary | ICD-10-CM

## 2018-06-15 DIAGNOSIS — I739 Peripheral vascular disease, unspecified: Secondary | ICD-10-CM | POA: Diagnosis not present

## 2018-06-15 DIAGNOSIS — E7800 Pure hypercholesterolemia, unspecified: Secondary | ICD-10-CM | POA: Diagnosis not present

## 2018-06-15 DIAGNOSIS — R350 Frequency of micturition: Secondary | ICD-10-CM | POA: Diagnosis not present

## 2018-06-15 DIAGNOSIS — I10 Essential (primary) hypertension: Secondary | ICD-10-CM | POA: Diagnosis not present

## 2018-06-15 DIAGNOSIS — N189 Chronic kidney disease, unspecified: Secondary | ICD-10-CM | POA: Diagnosis not present

## 2018-06-15 DIAGNOSIS — I70209 Unspecified atherosclerosis of native arteries of extremities, unspecified extremity: Secondary | ICD-10-CM

## 2018-06-15 DIAGNOSIS — C61 Malignant neoplasm of prostate: Secondary | ICD-10-CM | POA: Diagnosis not present

## 2018-06-15 DIAGNOSIS — D631 Anemia in chronic kidney disease: Secondary | ICD-10-CM

## 2018-06-15 MED ORDER — METFORMIN HCL 500 MG PO TABS: 250.0000 mg | ORAL_TABLET | Freq: Every day | ORAL | 3 refills | Status: DC

## 2018-06-15 NOTE — Progress Notes (Signed)
Subjective:    Patient ID: Ricardo Mayer., male    DOB: March 03, 1941, 77 y.o.   MRN: [id number]  HPI  Pt here for follow up and management of chronic medical problems which includes diabetes, hyperlipidemia and hypertension. He is taking medication regularly.  The patient is in for his regular visit and is doing well overall and has no specific complaints as usual.  He has had blood work done and we will review this with him during the visit today.  His BMP had a blood sugar that was 145.  His creatinine remains elevated similar to what it has been in the past at 1.82.  His electrolytes were normal including sodium and chloride.  The hemoglobin A1c was 6.7% and previously as of 8 months ago was 6.0%.  The vitamin D level remains low but because of renal function will not make any changes with the vitamin D the CBC has a normal white blood cell count a good hemoglobin at 14.8 and an adequate platelet count.  One liver function test the total protein was slightly decreased.  All other liver function tests were normal.  All cholesterol numbers were excellent and at goal with an LDL C being 55 glycerides being good at 139.  Because of the elevated hemoglobin A1c we will add a very low dose of metformin at 250 mg and we will ask that we get a repeat BMP in 2 to 3 weeks.  Patient today denies any chest pain.  He denies any pressure.  He does have some shortness of breath with walking and may be a little bit more than usual.  We did review his CT scan which was done several months ago and was recommended that no further CT scans be done.  He does have some scar tissue and the lungs.  He denies any trouble with swallowing heartburn indigestion nausea vomiting diarrhea blood in the stool black tarry bowel movements or change in bowel habits.  He does complain of a lot of burping and belching.  This is even sometimes after drinking water.  No heartburn.  We will continue to monitor that.  He is passing his water well  but having frequency and he sees the urologist again because of prostate cancer in about 3 months.  He continues to see Dr. Posey Pronto, his nephrologist regularly.     Patient Active Problem List   Diagnosis Date Noted  . Cellulitis 07/12/2017  . Recurrent deep vein thrombosis (DVT) ([place]) 07/10/2017  . Localized edema 07/07/2017  . Pure hypercholesterolemia 07/13/2016  . Essential hypertension 07/13/2016  . Vitamin D deficiency 07/13/2016  . Morbid obesity ([employer]) 05/27/2016  . Anemia of chronic disease   . LOC (loss of consciousness) ([place]) 04/08/2016  . TIA (transient ischemic attack) 04/08/2016  . 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 S/P knee replacement 03/21/2016  . Anemia associated with chronic renal failure 10/28/2015  . Hyperparathyroidism , secondary, non-renal ([place]) 10/28/2015  . Pulmonary emboli ([place]) 08/27/2015  . Near syncope 08/25/2015  . DOE (dyspnea on exertion) 06/02/2015  . Type 2 diabetes mellitus with vascular disease ([place]) 06/02/2015  . Melanoma of skin ([place]) 10/09/2013  . 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 Chronic kidney disease, stage III (moderate) ([place]) 10/09/2013  . Peripheral vascular insufficiency ([place]) 10/09/2013  . Statin intolerance 10/09/2013  . Prostate cancer ([place]) 12/25/2012   Outpatient Encounter Medications as of 06/15/2018  Medication Sig  . amLODipine (NORVASC) 5  MG tablet Take 1 tablet (5 mg total) by mouth daily.  Marland Kitchen atorvastatin (LIPITOR) 10 MG tablet Take 1 tablet (10 mg total) by mouth daily.  . blood glucose meter kit and supplies KIT Dispense based on patient and insurance preference. Use up to four times daily as directed. (FOR ICD-9 250.00, 250.01).  . furosemide (LASIX) 20 MG tablet Take 1 tablet (20 mg total) daily as needed by mouth.  Marland Kitchen glucose blood (ONE TOUCH ULTRA TEST) test strip Test up to tid. DX E11.9  . olmesartan (BENICAR) 40 MG tablet Take 1 tablet (40 mg total) by mouth daily.  .  vitamin B-12 (CYANOCOBALAMIN) 1000 MCG tablet Take 1,000 mcg by mouth daily.  Alveda Reasons 20 MG TABS tablet TAKE 1 TABLET BY MOUTH ONCE DAILY WITH FOOD  . [DISCONTINUED] oseltamivir (TAMIFLU) 75 MG capsule Take 1 capsule (75 mg total) by mouth daily.   No facility-administered encounter medications on file as of 06/15/2018.      Review of Systems  Constitutional: Negative.   HENT: Negative.   Eyes: Negative.   Respiratory: Negative.   Cardiovascular: Negative.   Gastrointestinal: Negative.   Endocrine: Negative.   Genitourinary: Negative.   Musculoskeletal: Negative.   Skin: Negative.   Allergic/Immunologic: Negative.   Neurological: Negative.   Hematological: Negative.   Psychiatric/Behavioral: Negative.        Objective:   Physical Exam  Constitutional: He is oriented to person, place, and time. He appears well-developed and well-nourished. No distress.  The patient is pleasant and alert and a noncomplainer.  HENT:  Head: Normocephalic and atraumatic.  Right Ear: External ear normal.  Left Ear: External ear normal.  Nose: Nose normal.  Mouth/Throat: Oropharynx is clear and moist. No oropharyngeal exudate.  Bilateral hearing aids otherwise ears nose and throat are normal.  Eyes: Pupils are equal, round, and reactive to light. Conjunctivae and EOM are normal. Right eye exhibits no discharge. Left eye exhibits no discharge. No scleral icterus.  Neck: Normal range of motion. Neck supple. No thyromegaly present.  No bruits or thyromegaly or anterior cervical adenopathy  Cardiovascular: Normal rate, regular rhythm, normal heart sounds and intact distal pulses.  No murmur heard. The heart is regular at 72/min and patient wears support stockings and currently no edema in either foot with good pedal pulses.  Pulmonary/Chest: Effort normal and breath sounds normal. He has no wheezes. He has no rales. He exhibits no tenderness.  Clear anteriorly and posteriorly and no axillary  adenopathy or chest wall masses  Abdominal: Soft. Bowel sounds are normal. He exhibits no mass. There is no tenderness.  Abdominal obesity without liver or spleen enlargement epigastric tenderness masses or bruits.  Genitourinary:  Genitourinary Comments: Patient continues to see urologist regularly because of prostate cancer  Musculoskeletal: Normal range of motion. He exhibits no edema.  Patient does have bilateral bunions calluses on both great toes and fungal nail infections.  His sensation was normal.  Lymphadenopathy:    He has no cervical adenopathy.  Neurological: He is alert and oriented to person, place, and time. He has normal reflexes. No cranial nerve deficit.  Skin: Skin is warm and dry. No rash noted.  Some bruising on hands.  Psychiatric: He has a normal mood and affect. His behavior is normal. Judgment and thought content normal.  The patient's mood affect and behavior were all normal for him.  Nursing note and vitals reviewed.   BP (!) 157/85 (BP Location: Left Arm)   Pulse 75  Temp (!) 96.7 F (35.9 C) (Oral)   Ht _0  (1.702 m)   Wt 243 lb (110.2 kg)   BMI 38.06 kg/m        Assessment & Plan:  1. Type 2 diabetes mellitus with vascular disease ([place]) -The patient's hemoglobin A1c is creeping up slightly.  He does have chronic kidney disease.  We will add metformin to 51 daily and encouraged him to watch his diet more closely and get more exercise.  2. Pure hypercholesterolemia -All cholesterol numbers were excellent and he will continue with his atorvastatin  3. Chronic kidney disease, stage III (moderate) ([facility]) -Continue to follow-up with nephrology  4. Anemia associated with chronic renal failure -Hemoglobin was actually improved this time and he is not anemic currently.  5. Essential hypertension -The blood pressure was up on the initial check today but will be rechecked again before he leaves.  Readings at home of been running in the 759F and 638G  systolic.  6. Vitamin D deficiency -He is currently not on vitamin D replacement and we will let the nephrologist make decisions about any replacement.  This is because of his chronic kidney disease.  7. Prostate cancer ([place]) -Continue to follow-up with urology  8. Urinary frequency -Continue to follow-up with urology  9. Peripheral vascular insufficiency ([facility]) -Continue to wear support stockings and walk regularly  10. Senile purpura ([place]) -This is still noted on his hands.  11. Morbid obesity ([place]) -He will continue to work at weight loss through diet and exercise and is considered morbidly obese because of a BMI greater than 35 with 2 or more comorbid health conditions.  Patient Instructions                       Medicare Annual Wellness Visit  [facility] and the medical providers at [facility] strive to bring you the best medical care.  In doing so we not only want to address your current medical conditions and concerns but also to detect new conditions early and prevent illness, disease and health-related problems.    Medicare offers a yearly Wellness Visit which allows our clinical staff to assess your need for preventative services including immunizations, lifestyle education, counseling to decrease risk of preventable diseases and screening for fall risk and other medical concerns.    This visit is provided free of charge (no copay) for all Medicare recipients. The clinical pharmacists at [facility] have begun to conduct these Wellness Visits which will also include a thorough review of all your medications.    As you primary medical provider recommend that you make an appointment for your Annual Wellness Visit if you have not done so already this year.  You may set up this appointment before you leave today or you may call back ([phone]) and schedule an appointment.  Please make sure when you call that you mention that you are  scheduling your Annual Wellness Visit with the clinical pharmacist so that the appointment may be made for the proper length of time.     Continue current medications. Continue good therapeutic lifestyle changes which include good diet and exercise. Fall precautions discussed with patient. If an FOBT was given today- please return it to our front desk. If you are over 6 years old - you may need Prevnar 46 or the adult Pneumonia vaccine.  **Flu shots are available--- please call and schedule a FLU-CLINIC appointment**  After your visit with  [name] today you will receive a survey in the mail or online from [employer] regarding your care with [name]. Please take a moment to fill this out. Your feedback is very important to [name] as you can help [name] better understand your patient needs as well as improve your experience and satisfaction. WE CARE ABOUT YOU!!!   Continue to drink plenty of fluids and watch diet closely so is to keep blood sugar under the best control possible Take metformin 250 mg 1 daily Monitor blood sugars at home especially fasting blood sugars Please come back to the office in 2 to 3 weeks and get a BMP so we can monitor the impact of the metformin on your kidney function Try to increase physical activity to improve endurance and see if this will help the shortness of breath When the BMP is repeated in 2 to 3 weeks bring a copy of home blood pressure readings and have nurse check blood pressure here Continue to keep follow-up visits with the urologist and the nephrologist  Arrie Senate MD

## 2018-06-15 NOTE — Telephone Encounter (Signed)
Will discuss at appt today with [facility]

## 2018-06-15 NOTE — Patient Instructions (Addendum)
Medicare Annual Wellness Visit  [facility] and the medical providers at [facility] strive to bring you the best medical care.  In doing so we not only want to address your current medical conditions and concerns but also to detect new conditions early and prevent illness, disease and health-related problems.    Medicare offers a yearly Wellness Visit which allows our clinical staff to assess your need for preventative services including immunizations, lifestyle education, counseling to decrease risk of preventable diseases and screening for fall risk and other medical concerns.    This visit is provided free of charge (no copay) for all Medicare recipients. The clinical pharmacists at [facility] have begun to conduct these Wellness Visits which will also include a thorough review of all your medications.    As you primary medical provider recommend that you make an appointment for your Annual Wellness Visit if you have not done so already this year.  You may set up this appointment before you leave today or you may call back ([phone]) and schedule an appointment.  Please make sure when you call that you mention that you are scheduling your Annual Wellness Visit with the clinical pharmacist so that the appointment may be made for the proper length of time.     Continue current medications. Continue good therapeutic lifestyle changes which include good diet and exercise. Fall precautions discussed with patient. If an FOBT was given today- please return it to our front desk. If you are over 9 years old - you may need Prevnar 3 or the adult Pneumonia vaccine.  **Flu shots are available--- please call and schedule a FLU-CLINIC appointment**  After your visit with [name] today you will receive a survey in the mail or online from [employer] regarding your care with [name]. Please take a moment to fill this out. Your feedback is very  important to [name] as you can help [name] better understand your patient needs as well as improve your experience and satisfaction. WE CARE ABOUT YOU!!!   Continue to drink plenty of fluids and watch diet closely so is to keep blood sugar under the best control possible Take metformin 250 mg 1 daily Monitor blood sugars at home especially fasting blood sugars Please come back to the office in 2 to 3 weeks and get a BMP so we can monitor the impact of the metformin on your kidney function Try to increase physical activity to improve endurance and see if this will help the shortness of breath When the BMP is repeated in 2 to 3 weeks bring a copy of home blood pressure readings and have nurse check blood pressure here Continue to keep follow-up visits with the urologist and the nephrologist

## 2018-07-05 ENCOUNTER — Ambulatory Visit (INDEPENDENT_AMBULATORY_CARE_PROVIDER_SITE_OTHER): Payer: Medicare Other | Admitting: *Deleted

## 2018-07-05 ENCOUNTER — Encounter: Payer: Self-pay | Admitting: *Deleted

## 2018-07-05 VITALS — BP 155/83 | HR 76 | Ht 67.0 in | Wt 243.0 lb

## 2018-07-05 DIAGNOSIS — Z0000 Encounter for general adult medical examination without abnormal findings: Secondary | ICD-10-CM

## 2018-07-05 DIAGNOSIS — Z23 Encounter for immunization: Secondary | ICD-10-CM

## 2018-07-05 NOTE — Progress Notes (Addendum)
Subjective:   Ricardo Spellman. is a 77 y.o. male who presents for Medicare Annual/Subsequent preventive examination.  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  He enjoys watching sports and reading.  Ricardo Mayer lives at home with his wife.  He has 3 sons and 4 grandsons.  He reports that he is not taking the blood pressure medications because his blood pressure readings were the same whether he was taking it or not.  He states Dr. Laurance Flatten is aware that he is not taking the Norvasc or Benicar.  Ricardo Mayer feels his health is unchanged from last year.  He reports no surgeries, ER visits, or hospitalizations in the past year.  Review of Systems:  All negative today       Objective:    Vitals: BP (!) 155/83   Pulse 76   Ht '5\' 7"'  (1.702 m)   Wt 243 lb (110.2 kg)   BMI 38.06 kg/m   Body mass index is 38.06 kg/m.  Advanced Directives 07/05/2018 07/10/2017 07/10/2017 04/08/2016 03/24/2016 03/21/2016 03/14/2016  Does Patient Have a Medical Advance Directive? Yes Yes No Yes Yes Yes Yes  Type of Paramedic of [place];Living will [place];Living will - [place];Living will Living will;Healthcare Power of Attorney Living will;Healthcare Power of Attorney Living will;Healthcare Power of Attorney  Does patient want to make changes to medical advance directive? No - Patient declined No - Patient declined - - - No - Patient declined No - Patient declined  Copy of [place] in Chart? Yes - validated most recent copy scanned in chart (See row information) No - copy requested - No - copy requested - No - copy requested No - copy requested  Would patient like information on creating a medical advance directive? - No - Patient declined No - Patient declined - - - -     Tobacco Social History   Tobacco Use  Smoking Status Never Smoker  Smokeless Tobacco Never Used     Counseling given: No   Clinical Intake:     Pain Score: 0-No pain                 Past Medical History:  Diagnosis Date  . Adenomatous polyp of colon   . Arthritis    bilateral knees  . CKD (chronic kidney disease), stage II    Dr. Patel,nephrology "stable last 3 yrs"  . Diabetes mellitus without complication ([place])    Dr. Laurance Flatten.PCP  . History of basal cell cancer   . History of DVT of lower extremity    2011--  LEFT LEG  . History of kidney stones   . History of melanoma excision    RIGHT ARM  . Hyperlipidemia   . Hypertension   . Impaired hearing    hearing aids bilateral  . Melanoma ([place]) 1990   arm and back -Melanoma, basal cell others- Dr. Edgardo Roys follows.  . Near syncope 08/25/2015   Dr. Roland Rack once"found blood clots in lung"  . Nocturia   . Prostate cancer [facility]) 2014   Dr. Cherylann Parr- "seed implant" '14  . Ulcer of left lower leg ([place])    recent pcp visit dr Laurance Flatten--  ordered betadine wet/dry dsg daily (03-18-2013)  [place]  IN 1 WEEK   Past Surgical History:  Procedure Laterality Date  . KIDNEY STONE EXTRACTIONS  LAST ONE 1970   X2 OPEN/  X1  URETEROSCOPY  . MELANOMA EXCISION RIGHT ARM  1990  . PROSTATE BIOPSY    . RADIOACTIVE SEED IMPLANT N/A 03/21/2013   Procedure: RADIOACTIVE SEED IMPLANT;  Surgeon: Malka So, MD;  [facility]: [facility];  Service: Urology;  Laterality: N/A;  87 seeds implanted    . TONSILLECTOMY    . TOTAL KNEE ARTHROPLASTY Right 03/21/2016   Procedure: RIGHT TOTAL KNEE ARTHROPLASTY;  Surgeon: Paralee Cancel, MD;  Location: [facility];  Service: Orthopedics;  Laterality: Right;   Family History  Problem Relation Age of Onset  . Breast cancer Mother   . Stroke Mother   . Hypertension Mother   . Lung cancer Father        smoked  . Ulcers Father   . Heart disease Brother 25        stent  . Esophageal cancer Brother   . Cancer Paternal Uncle        prostate  . Cancer Paternal Grandfather        prostate  . Heart attack Neg Hx    Social History   Socioeconomic History  . Marital status: Married    Spouse name: Not on file  . Number of children: 3  . Years of education: master's  . Highest education level: Not on file  Occupational History  . Occupation: Retail buyer  Social Needs  . Financial resource strain: Not hard at all  . Food insecurity:    Worry: Never true    Inability: Never true  . Transportation needs:    Medical: No    Non-medical: No  Tobacco Use  . Smoking status: Never Smoker  . Smokeless tobacco: Never Used  Substance and Sexual Activity  . Alcohol use: No  . Drug use: No  . Sexual activity: Not on file  Lifestyle  . Physical activity:    Days per week: 0 days    Minutes per session: 0 min  . Stress: Not on file  Relationships  . Social connections:    Talks on phone: Three times a week    Gets together: More than three times a week    Attends religious service: More than 4 times per year    Active member of club or organization: Yes    Attends meetings of clubs or organizations: More than 4 times per year    Relationship status: Married  Other Topics Concern  . Not on file  Social History Narrative   Patient drinks 1 cup of coffee daily.   Patient is right handed.     Outpatient Encounter Medications as of 07/05/2018  Medication Sig  . atorvastatin (LIPITOR) 10 MG tablet Take 1 tablet (10 mg total) by mouth daily.  . blood glucose meter kit and supplies KIT Dispense based on patient and insurance preference. Use up to four times daily as directed. (FOR ICD-9 250.00, 250.01).  . furosemide (LASIX) 20 MG tablet Take 1 tablet (20 mg total) daily as needed by mouth.  Marland Kitchen glucose blood (ONE TOUCH ULTRA TEST) test strip Test up to tid. DX E11.9  . metFORMIN (GLUCOPHAGE) 500 MG tablet Take 0.5 tablets (250 mg  total) by mouth daily with breakfast.  . XARELTO 20 MG TABS tablet TAKE 1 TABLET BY MOUTH ONCE DAILY WITH FOOD  . amLODipine (NORVASC) 5 MG tablet Take 1  tablet (5 mg total) by mouth daily. (Patient not taking: Reported on 07/05/2018)  . olmesartan (BENICAR) 40 MG tablet Take 1 tablet (40 mg total) by mouth daily. (Patient not taking: Reported on 07/05/2018)  . vitamin B-12 (CYANOCOBALAMIN) 1000 MCG tablet Take 1,000 mcg by mouth daily.   No facility-administered encounter medications on file as of 07/05/2018.     Activities of Daily Living In your present state of health, do you have any difficulty performing the following activities: 07/05/2018 07/10/2017  Hearing? Y N  Comment Wears bilateral hearing aids -  Vision? N N  Difficulty concentrating or making decisions? Y N  Comment Trouble remembering names -  Walking or climbing stairs? Y N  Comment knee pain -  Dressing or bathing? N N  Doing errands, shopping? N N  Preparing Food and eating ? N -  Using the Toilet? N -  In the past six months, have you accidently leaked urine? Y -  Comment Urine leaking since prostate cancer, followed by Dr. Jeffie Pollock -  Do you have problems with loss of bowel control? N -  Managing your Medications? N -  Managing your Finances? N -  Housekeeping or managing your Housekeeping? N -  Some recent data might be hidden    Patient Care Team: [name], MD as PCP - General (Family Medicine) Kathrynn Ducking, MD as Consulting Physician (Neurology) Jettie Booze, MD as Consulting Physician (Cardiology) Starlyn Skeans as Physician Assistant (Dermatology) Elmarie Shiley, MD as Consulting Physician (Nephrology) Irine Seal, MD as Attending Physician (Urology)   Assessment:   This is a routine wellness examination for Ricardo Mayer.  Exercise Activities and Dietary recommendations    Ricardo Mayer states he eats 3 meals per day, and snacks as needed.  Usually has whole grain cereal and milk for  breakfast with blueberries, banana for snack, lunchable with carrots and apple for lunch, and protein and vegetables for supper, walnuts as evening snack.  Advised patient to eat a diet of mostly whole, unprocessed foods.  Advised him that fruit is very healthy, however, it does have natural sugar may want to eat half banana and apple with a small handful of nuts as snack. Recommended continue diet of mostly non-starchy vegetables, lean proteins, and fruits and whole grains in moderation.  Ricardo Mayer does not currently participate in regular exercise.   Goals    . Exercise 3x per week (30 min per time)     Working out at [employer] or walking are great options.       Fall Risk Fall Risk  07/05/2018 06/15/2018 02/09/2018 10/04/2017 07/19/2017  Falls in the past year? 0 0 No No No  Number falls in past yr: - - - - -  Comment - - - - -  Injury with Fall? - - - - -  Follow up - - - - -   Is the patient's home free of loose throw rugs in walkways, pet beds, electrical cords, etc?   no, patient states they have non-skid rugs in front of door, advised patient to use caution to avoid tripping on rug, or remove if this becomes a problem      Grab bars in the bathroom? no      Handrails on the stairs?   yes      Adequate lighting?   yes    Depression Screen PHQ 2/9 Scores 07/05/2018 06/15/2018 02/09/2018 10/04/2017  PHQ - 2 Score 0 0 0 0  Cognitive Function MMSE - Mini Mental State Exam 07/05/2018 08/26/2015 08/26/2015  Orientation to time '5 5 5  ' Orientation to Place '5 5 5  ' Registration 3 3 -  Attention/ Calculation 5 5 -  Recall 1 3 -  Language- name 2 objects 2 2 -  Language- repeat 1 1 -  Language- follow 3 step command 3 3 -  Language- read & follow direction 1 1 -  Write a sentence 1 1 -  Copy design 1 1 -  Total score 28 30 -        Immunization History  Administered Date(s) Administered  . Influenza Whole 05/20/2016  . Influenza-Unspecified 05/15/2013, 05/15/2014, 06/09/2015   . Pneumococcal Conjugate-13 10/09/2013  . Pneumococcal Polysaccharide-23 07/05/2018    Qualifies for Shingles Vaccine? Yes, Declined today  Screening Tests Health Maintenance  Topic Date Due  . PNA vac Low Risk Adult (2 of 2 - PPSV23) 07/13/2018 (Originally 10/09/2014)  . COLONOSCOPY  10/04/2018 (Originally 06/12/2017)  . HEMOGLOBIN A1C  12/07/2018  . OPHTHALMOLOGY EXAM  04/07/2019  . FOOT EXAM  06/16/2019  . TETANUS/TDAP  03/15/2021  . INFLUENZA VACCINE  Completed   Cancer Screenings: Lung: Low Dose CT Chest recommended if Age 14-80 years, 30 pack-year currently smoking OR have quit w/in 15years. Patient does not qualify. Colorectal:  Patient plans to schedule in January 2020  Additional Screenings: Hepatitis C Screening: Not indicated       Plan:     Work on your goal of increasing your exercise to 3 times per week for 30 minutes per session.   Follow up with Dr. Laurance Flatten as scheduled.   I have personally reviewed and noted the following in the patient's chart:   . Medical and social history . Use of alcohol, tobacco or illicit drugs  . Current medications and supplements . Functional ability and status . Nutritional status . Physical activity . Advanced directives . List of other physicians . Hospitalizations, surgeries, and ER visits in previous 12 months . Vitals . Screenings to include cognitive, depression, and falls . Referrals and appointments  In addition, I have reviewed and discussed with patient certain preventive protocols, quality metrics, and best practice recommendations. A written personalized care plan for preventive services as well as general preventive health recommendations were provided to patient.     Gwenette Wellons M, RN  07/05/2018  I have reviewed and agree with the above AWV documentation.   Mary-Margaret Hassell Done, FNP

## 2018-07-05 NOTE — Patient Instructions (Addendum)
Please work on your goal of increasing your exercise to 3 times per week for 30 minutes per session.    Please follow up with Dr. Laurance Flatten as scheduled.  Thank you for coming in for your Annual Wellness Visit today, and Happy Thanksgiving!   Preventive Care 16 Years and Older, Male Preventive care refers to lifestyle choices and visits with your health care provider that can promote health and wellness. What does preventive care include?  A yearly physical exam. This is also called an annual well check.  Dental exams once or twice a year.  Routine eye exams. Ask your health care provider how often you should have your eyes checked.  Personal lifestyle choices, including: ? Daily care of your teeth and gums. ? Regular physical activity. ? Eating a healthy diet. ? Avoiding tobacco and drug use. ? Limiting alcohol use. ? Practicing safe sex. ? Taking low doses of aspirin every day. ? Taking vitamin and mineral supplements as recommended by your health care provider. What happens during an annual well check? The services and screenings done by your health care provider during your annual well check will depend on your age, overall health, lifestyle risk factors, and family history of disease. Counseling Your health care provider may ask you questions about your:  Alcohol use.  Tobacco use.  Drug use.  Emotional well-being.  Home and relationship well-being.  Sexual activity.  Eating habits.  History of falls.  Memory and ability to understand (cognition).  Work and work Statistician.  Screening You may have the following tests or measurements:  Height, weight, and BMI.  Blood pressure.  Lipid and cholesterol levels. These may be checked every 5 years, or more frequently if you are over 57 years old.  Skin check.  Lung cancer screening. You may have this screening every year starting at age 31 if you have a 30-pack-year history of smoking and currently smoke or  have quit within the past 15 years.  Fecal occult blood test (FOBT) of the stool. You may have this test every year starting at age 63.  Flexible sigmoidoscopy or colonoscopy. You may have a sigmoidoscopy every 5 years or a colonoscopy every 10 years starting at age 21.  Prostate cancer screening. Recommendations will vary depending on your family history and other risks.  Hepatitis C blood test.  Hepatitis B blood test.  Sexually transmitted disease (STD) testing.  Diabetes screening. This is done by checking your blood sugar (glucose) after you have not eaten for a while (fasting). You may have this done every 1-3 years.  Abdominal aortic aneurysm (AAA) screening. You may need this if you are a current or former smoker.  Osteoporosis. You may be screened starting at age 81 if you are at high risk.  Talk with your health care provider about your test results, treatment options, and if necessary, the need for more tests. Vaccines Your health care provider may recommend certain vaccines, such as:  Influenza vaccine. This is recommended every year.  Tetanus, diphtheria, and acellular pertussis (Tdap, Td) vaccine. You may need a Td booster every 10 years.  Varicella vaccine. You may need this if you have not been vaccinated.  Zoster vaccine. You may need this after age 49.  Measles, mumps, and rubella (MMR) vaccine. You may need at least one dose of MMR if you were born in 1957 or later. You may also need a second dose.  Pneumococcal 13-valent conjugate (PCV13) vaccine. One dose is recommended after age 20.  Pneumococcal polysaccharide (PPSV23) vaccine. One dose is recommended after age 59.  Meningococcal vaccine. You may need this if you have certain conditions.  Hepatitis A vaccine. You may need this if you have certain conditions or if you travel or work in places where you may be exposed to hepatitis A.  Hepatitis B vaccine. You may need this if you have certain conditions  or if you travel or work in places where you may be exposed to hepatitis B.  Haemophilus influenzae type b (Hib) vaccine. You may need this if you have certain risk factors.  Talk to your health care provider about which screenings and vaccines you need and how often you need them. This information is not intended to replace advice given to you by your health care provider. Make sure you discuss any questions you have with your health care provider. Document Released: 08/28/2015 Document Revised: 04/20/2016 Document Reviewed: 06/02/2015 Elsevier Interactive Patient Education  [employer].

## 2018-07-10 ENCOUNTER — Encounter: Payer: Medicare Other | Admitting: *Deleted

## 2018-07-16 NOTE — Addendum Note (Signed)
Addended by: Denyce Robert on: 07/16/2018 01:54 PM   Modules accepted: Level of Service

## 2018-09-01 ENCOUNTER — Other Ambulatory Visit: Payer: Self-pay | Admitting: Physician Assistant

## 2018-09-01 ENCOUNTER — Other Ambulatory Visit: Payer: Self-pay | Admitting: Family Medicine

## 2018-09-05 ENCOUNTER — Ambulatory Visit (INDEPENDENT_AMBULATORY_CARE_PROVIDER_SITE_OTHER): Payer: Medicare Other | Admitting: Family Medicine

## 2018-09-05 ENCOUNTER — Encounter: Payer: Self-pay | Admitting: Family Medicine

## 2018-09-05 VITALS — BP 158/89 | HR 102 | Temp 97.1°F | Ht 67.0 in | Wt 243.0 lb

## 2018-09-05 DIAGNOSIS — J069 Acute upper respiratory infection, unspecified: Secondary | ICD-10-CM

## 2018-09-05 MED ORDER — LORATADINE 10 MG PO TABS: 10.0000 mg | ORAL_TABLET | Freq: Every day | ORAL | 11 refills | Status: DC

## 2018-09-05 NOTE — Patient Instructions (Addendum)

## 2018-09-05 NOTE — Progress Notes (Signed)
    Subjective:     Ricardo Mayer. is a 78 y.o. male who presents for evaluation of symptoms of a URI. Symptoms include congestion, cough described as nonproductive, nasal congestion, no  fever, non productive cough, post nasal drip, sore throat and rhinorrhea. Onset of symptoms was 3 days ago, and has been stable since that time. Treatment to date: none.  The following portions of the patient's history were reviewed and updated as appropriate: allergies, current medications, past family history, past medical history, past social history, past surgical history and problem list.  Review of Systems Constitutional: positive for chills Eyes: negative Ears, nose, mouth, throat, and face: positive for nasal congestion and sore throat Respiratory: positive for cough Cardiovascular: negative Musculoskeletal:negative Neurological: negative   Objective:    BP (!) 158/89   Pulse (!) 102   Temp (!) 97.1 F (36.2 C) (Oral)   Ht 5\' 7"  (1.702 m)   Wt 243 lb (110.2 kg)   BMI 38.06 kg/m  General appearance: alert, cooperative and no distress Head: Normocephalic, without obvious abnormality, atraumatic Eyes: negative Ears: normal TM's and external ear canals both ears Nose: Nares normal. Septum midline. Mucosa normal. No drainage or sinus tenderness., clear discharge, mild congestion, turbinates red, no sinus tenderness Throat: abnormal findings: mild oropharyngeal erythema Neck: no adenopathy, no carotid bruit, no JVD, supple, symmetrical, trachea midline and thyroid not enlarged, symmetric, no tenderness/mass/nodules Back: negative Lungs: clear to auscultation bilaterally Heart: regular rate and rhythm, S1, S2 normal, no murmur, click, rub or gallop Skin: Skin color, texture, turgor normal. No rashes or lesions Lymph nodes: Cervical, supraclavicular, and axillary nodes normal. Neurologic: Grossly normal   Assessment:     Ricardo Mayer was seen today for uri.  Diagnoses and all orders for  this visit:  URI with cough and congestion Increase humidity in the air. Increase fluid intake. Report any new or worsening symptoms. Can use over the counter Coricidin HBP. Medications as prescribed.  -     loratadine (CLARITIN) 10 MG tablet; Take 1 tablet (10 mg total) by mouth daily.   Plan:    Discussed diagnosis and treatment of URI. Discussed the importance of avoiding unnecessary antibiotic therapy. Suggested symptomatic OTC remedies. Nasal saline spray for congestion. Follow up as needed. Call in 3 days if symptoms aren't resolving. Follow up with PCP as needed.    The above assessment and management plan was discussed with the patient. The patient verbalized understanding of and has agreed to the management plan. Patient is aware to call the clinic if symptoms fail to improve or worsen. Patient is aware when to return to the clinic for a follow-up visit. Patient educated on when it is appropriate to go to the emergency department.   Monia Pouch, FNP-C [facility] [address] [place], [place] [address] [phone]

## 2018-09-11 ENCOUNTER — Ambulatory Visit: Payer: Medicare Other | Admitting: Family Medicine

## 2018-09-17 ENCOUNTER — Encounter: Payer: Self-pay | Admitting: Physician Assistant

## 2018-09-17 ENCOUNTER — Ambulatory Visit (INDEPENDENT_AMBULATORY_CARE_PROVIDER_SITE_OTHER): Payer: Medicare Other | Admitting: Physician Assistant

## 2018-09-17 VITALS — BP 150/96 | HR 76 | Ht 65.5 in | Wt 241.2 lb

## 2018-09-17 DIAGNOSIS — Z86718 Personal history of other venous thrombosis and embolism: Secondary | ICD-10-CM

## 2018-09-17 DIAGNOSIS — Z7901 Long term (current) use of anticoagulants: Secondary | ICD-10-CM | POA: Diagnosis not present

## 2018-09-17 DIAGNOSIS — Z86010 Personal history of colonic polyps: Secondary | ICD-10-CM

## 2018-09-17 DIAGNOSIS — Z01818 Encounter for other preprocedural examination: Secondary | ICD-10-CM

## 2018-09-17 NOTE — Progress Notes (Signed)
Chief Complaint: Preprocedural visit for history of colon polyps in a patient on chronic anticoagulation  HPI:    [name] is a 78 year old Caucasian male with a past medical history as listed below including CKD, diabetes and history of DVT PE, the last in November 2019 (echo 06/16/2015 LVEF 55-60%), known to Dr. Henrene Pastor, who was referred to me by [name], MD for a preprocedural visit for a surveillance colonoscopy on chronic anticoagulation.    09/13/2011 colonoscopy Dr. Henrene Pastor due to a history of adenomatous polyps (2004 ; 2010 with 3 adenomas) with a diminutive polyp in the ascending colon, mild diverticulosis in the sigmoid colon otherwise normal.  Pathology showed hyperplastic polyp.  Repeat recommended in 5 years.    Today, the patient tells me he is not having any GI issues.  He does ask if he could possibly do the Cologuard instead of a colonoscopy.  Also tells me he is going to have some trouble with transport for the procedure until a specific time.    Denies fever, chills, blood in the stool, weight loss, abdominal pain, nausea, vomiting, heartburn, reflux or symptoms that awaken him from sleep.  Past Medical History:  Diagnosis Date  . Adenomatous polyp of colon   . Arthritis    bilateral knees  . CKD (chronic kidney disease), stage II    Dr. Patel,nephrology "stable last 3 yrs"  . Diabetes mellitus without complication ([place])    Dr. Laurance Flatten.PCP  . History of basal cell cancer   . History of DVT of lower extremity    2011--  LEFT LEG  . History of kidney stones   . History of melanoma excision    RIGHT ARM  . Hyperlipidemia   . Hypertension   . Impaired hearing    hearing aids bilateral  . Melanoma ([place]) 1990   arm and back -Melanoma, basal cell others- Dr. Edgardo Roys follows.  . Near syncope 08/25/2015   Dr. Roland Rack once"found blood clots in lung"  . Nocturia   . Prostate cancer [facility]) 2014   Dr. Cherylann Parr- "seed implant" '14  . Ulcer of left lower  leg ([place])    recent pcp visit dr Laurance Flatten--  ordered betadine wet/dry dsg daily (03-18-2013)  [place] IN 1 WEEK    Past Surgical History:  Procedure Laterality Date  . KIDNEY STONE EXTRACTIONS  LAST ONE 1970   X2 OPEN/  X1  URETEROSCOPY  . MELANOMA EXCISION RIGHT ARM  1990  . PROSTATE BIOPSY    . RADIOACTIVE SEED IMPLANT N/A 03/21/2013   Procedure: RADIOACTIVE SEED IMPLANT;  Surgeon: Malka So, MD;  Location: [facility];  Service: Urology;  Laterality: N/A;  87 seeds implanted    . TONSILLECTOMY    . TOTAL KNEE ARTHROPLASTY Right 03/21/2016   Procedure: RIGHT TOTAL KNEE ARTHROPLASTY;  Surgeon: Paralee Cancel, MD;  Location: [facility];  Service: Orthopedics;  Laterality: Right;    Current Outpatient Medications  Medication Sig Dispense Refill  . amLODipine (NORVASC) 5 MG tablet Take 1 tablet (5 mg total) by mouth daily. 90 tablet 3  . atorvastatin (LIPITOR) 10 MG tablet Take 1 tablet (10 mg total) by mouth daily. 90 tablet 3  . blood glucose meter kit and supplies KIT Dispense based on patient and insurance preference. Use up to four times daily as directed. (FOR ICD-9 250.00, 250.01). 1 each 0  . furosemide (LASIX) 20 MG tablet TAKE 1 TABLET BY MOUTH DAILY AS NEEDED 30  tablet 2  . glucose blood (ONE TOUCH ULTRA TEST) test strip Test up to tid. DX E11.9 100 each 2  . loratadine (CLARITIN) 10 MG tablet Take 1 tablet (10 mg total) by mouth daily. 30 tablet 11  . metFORMIN (GLUCOPHAGE) 500 MG tablet Take 0.5 tablets (250 mg total) by mouth daily with breakfast. 45 tablet 3  . olmesartan (BENICAR) 40 MG tablet Take 1 tablet (40 mg total) by mouth daily. 90 tablet 0  . vitamin B-12 (CYANOCOBALAMIN) 1000 MCG tablet Take 1,000 mcg by mouth daily.    Alveda Reasons 20 MG TABS tablet TAKE 1 TABLET BY MOUTH ONCE DAILY WITH FOOD 90 tablet 0   No current facility-administered medications for this visit.     Allergies as of 09/17/2018  . (No Known Allergies)    Family  History  Problem Relation Age of Onset  . Breast cancer Mother   . Stroke Mother   . Hypertension Mother   . Lung cancer Father        smoked  . Ulcers Father   . Heart disease Brother 13       stent  . Esophageal cancer Brother   . Cancer Paternal Uncle        prostate  . Cancer Paternal Grandfather        prostate  . Heart attack Neg Hx     Social History   Socioeconomic History  . Marital status: Married    Spouse name: Not on file  . Number of children: 3  . Years of education: master's  . Highest education level: Not on file  Occupational History  . Occupation: Retail buyer  Social Needs  . Financial resource strain: Not hard at all  . Food insecurity:    Worry: Never true    Inability: Never true  . Transportation needs:    Medical: No    Non-medical: No  Tobacco Use  . Smoking status: Never Smoker  . Smokeless tobacco: Never Used  Substance and Sexual Activity  . Alcohol use: No  . Drug use: No  . Sexual activity: Not on file  Lifestyle  . Physical activity:    Days per week: 0 days    Minutes per session: 0 min  . Stress: Not on file  Relationships  . Social connections:    Talks on phone: Three times a week    Gets together: More than three times a week    Attends religious service: More than 4 times per year    Active member of club or organization: Yes    Attends meetings of clubs or organizations: More than 4 times per year    Relationship status: Married  . Intimate partner violence:    Fear of current or ex partner: No    Emotionally abused: No    Physically abused: No    Forced sexual activity: No  Other Topics Concern  . Not on file  Social History Narrative   Patient drinks 1 cup of coffee daily.   Patient is right handed.     Review of Systems:    Constitutional: No weight loss, fever or chills Skin: No rash  Cardiovascular: No chest pain Respiratory: No SOB  Gastrointestinal: See HPI and otherwise  negative Genitourinary: No dysuria Neurological: No headache, dizziness or syncope Musculoskeletal: No new muscle or joint pain Hematologic: No bleeding  Psychiatric: No history of depression or anxiety   Physical Exam:  Vital signs: Ht 5' 5.5" (1.664 m) Comment:  height measured without shoes  Wt 241 lb 4 oz (109.4 kg)   BMI 39.54 kg/m   Constitutional:   Pleasant Elderly Caucasian male appears to be in NAD, Well developed, Well nourished, alert and cooperative Head:  Normocephalic and atraumatic. Eyes:   PEERL, EOMI. No icterus. Conjunctiva pink. Ears:  Normal auditory acuity. Neck:  Supple Throat: Oral cavity and pharynx without inflammation, swelling or lesion.  Respiratory: Respirations even and unlabored. Lungs clear to auscultation bilaterally.   No wheezes, crackles, or rhonchi.  Cardiovascular: Normal S1, S2. No MRG. Regular rate and rhythm. No peripheral edema, cyanosis or pallor.  Gastrointestinal:  Soft, nondistended, nontender. No rebound or guarding. Normal bowel sounds. No appreciable masses or hepatomegaly. Rectal:  Not performed.  Msk:  Symmetrical without gross deformities. Without edema, no deformity or joint abnormality.  Neurologic:  Alert and  oriented x4;  grossly normal neurologically.  Skin:   Dry and intact without significant lesions or rashes. Psychiatric:  Demonstrates good judgement and reason without abnormal affect or behaviors.  MOST RECENT LABS AND IMAGING: CBC    Component Value Date/Time   WBC 7.7 06/07/2018 1309   WBC 8.7 07/13/2017 0013   RBC 5.08 06/07/2018 1309   RBC 4.10 (L) 07/13/2017 0013   HGB 14.8 06/07/2018 1309   HCT 44.7 06/07/2018 1309   PLT 185 06/07/2018 1309   MCV 88 06/07/2018 1309   MCH 29.1 06/07/2018 1309   MCH 28.5 07/13/2017 0013   MCHC 33.1 06/07/2018 1309   MCHC 32.1 07/13/2017 0013   RDW 14.5 06/07/2018 1309   LYMPHSABS 2.5 06/07/2018 1309   MONOABS 0.5 07/10/2017 1451   EOSABS 0.2 06/07/2018 1309   BASOSABS  0.0 06/07/2018 1309    CMP     Component Value Date/Time   NA 140 06/07/2018 1309   K 5.2 06/07/2018 1309   CL 105 06/07/2018 1309   CO2 23 06/07/2018 1309   GLUCOSE 145 (H) 06/07/2018 1309   GLUCOSE 122 (H) 07/11/2017 0500   BUN 33 (H) 06/07/2018 1309   CREATININE 1.82 (H) 06/07/2018 1309   CREATININE 1.92 (H) 03/05/2013 1100   CALCIUM 9.5 06/07/2018 1309   PROT 5.9 (L) 06/07/2018 1309   ALBUMIN 4.0 06/07/2018 1309   AST 15 06/07/2018 1309   ALT 16 06/07/2018 1309   ALKPHOS 69 06/07/2018 1309   BILITOT 0.4 06/07/2018 1309   GFRNONAA 35 (L) 06/07/2018 1309   GFRAA 41 (L) 06/07/2018 1309    Assessment: 1.  Preprocedural exam for a surveillance colonoscopy: For history of adenomatous polyps on chronic anticaogulation 2.  History of adenomatous polyps: In 2004 and 2010, colonoscopy in 2013 with 1 hyperplastic polyp, repeat still recommended in 5 years given his history 3.  Chronic anticoagulation: With Xarelto for DVT/PE 4.  History of DVT/PE: x2, one in 2011 and the last in November 2019  Plan: 1.  Patient requests to be scheduled for a procedure around Easter as this is when he will have transportation.  We do not yet have the schedule for this timeframe for Dr. Henrene Pastor.  Did explain that if Dr. Henrene Pastor does not have availability around this time we could place him with another physician.  Did discuss risk benefits, limitiations and alternatives and the patient agrees to proceed.  We will call him when we get the schedule. 2.  Patient was advised to hold his Xarelto for 2 days.  We will communicate with his prescribing physician to ensure that holding his Xarelto is acceptable for  him. 3.  Patient to follow in clinic per recommendations after time of colonoscopy.  Ellouise Newer, PA-C [facility] 09/17/2018, 8:23 AM  Cc: [name], MD

## 2018-09-17 NOTE — Patient Instructions (Signed)
I will call you in the next month to schedule a colonoscopy

## 2018-09-17 NOTE — Progress Notes (Signed)
Anderson Malta,  Reviewed. I am ok performing exam on oral anticoagulant if he had recent recurrent DVT with PE as you outlined, if felt to be high risk off [place] by supervising physician. Thanks   Dr. Henrene Pastor

## 2018-09-18 ENCOUNTER — Encounter: Payer: Self-pay | Admitting: Internal Medicine

## 2018-10-01 ENCOUNTER — Other Ambulatory Visit: Payer: Medicare Other

## 2018-10-01 DIAGNOSIS — N183 Chronic kidney disease, stage 3 (moderate): Secondary | ICD-10-CM | POA: Diagnosis not present

## 2018-10-09 DIAGNOSIS — N2581 Secondary hyperparathyroidism of renal origin: Secondary | ICD-10-CM | POA: Diagnosis not present

## 2018-10-09 DIAGNOSIS — D631 Anemia in chronic kidney disease: Secondary | ICD-10-CM | POA: Diagnosis not present

## 2018-10-09 DIAGNOSIS — N183 Chronic kidney disease, stage 3 (moderate): Secondary | ICD-10-CM | POA: Diagnosis not present

## 2018-10-09 DIAGNOSIS — I129 Hypertensive chronic kidney disease with stage 1 through stage 4 chronic kidney disease, or unspecified chronic kidney disease: Secondary | ICD-10-CM | POA: Diagnosis not present

## 2018-10-15 ENCOUNTER — Ambulatory Visit (INDEPENDENT_AMBULATORY_CARE_PROVIDER_SITE_OTHER): Payer: Medicare Other | Admitting: Nurse Practitioner

## 2018-10-15 ENCOUNTER — Encounter: Payer: Self-pay | Admitting: Nurse Practitioner

## 2018-10-15 VITALS — BP 177/84 | HR 74 | Temp 96.8°F | Ht 65.0 in | Wt 237.0 lb

## 2018-10-15 DIAGNOSIS — I10 Essential (primary) hypertension: Secondary | ICD-10-CM | POA: Diagnosis not present

## 2018-10-15 DIAGNOSIS — J3489 Other specified disorders of nose and nasal sinuses: Secondary | ICD-10-CM | POA: Diagnosis not present

## 2018-10-15 MED ORDER — FLUTICASONE PROPIONATE 50 MCG/ACT NA SUSP: 2.0000 | Freq: Every day | NASAL | 6 refills | Status: DC

## 2018-10-15 MED ORDER — OLMESARTAN MEDOXOMIL 40 MG PO TABS: 40.0000 mg | ORAL_TABLET | Freq: Every day | ORAL | 0 refills | Status: DC

## 2018-10-15 MED ORDER — AMLODIPINE BESYLATE 5 MG PO TABS: 5.0000 mg | ORAL_TABLET | Freq: Every day | ORAL | 3 refills | Status: DC

## 2018-10-15 NOTE — Progress Notes (Signed)
   Subjective:    Patient ID: Ricardo Huguenin., male    DOB: 06-28-1941, 78 y.o.   MRN: [id number]   Chief Complaint: Cough and BP high at home   HPI Patient comes in today with 2 complaints: - has tickel in back of throat and is constantly having to clear his throat. Had had for over a month. Occasional cough. -his blood pressure has been running high around 180/90. He says he is not taking his blood pressure meds. He was on atorvasstin and thought that was his blood pressure meds. He is suppose to be on amlodipine and olemarsartin.   Review of Systems  Constitutional: Negative.   HENT: Positive for congestion and postnasal drip.   Respiratory: Negative for cough and shortness of breath.   Cardiovascular: Negative.   Gastrointestinal: Negative.   Neurological: Negative.   Psychiatric/Behavioral: Negative.   All other systems reviewed and are negative.      Objective:   Physical Exam Constitutional:      General: He is not in acute distress.    Appearance: He is obese.  HENT:     Right Ear: Hearing, tympanic membrane, ear canal and external ear normal.     Left Ear: Hearing, tympanic membrane, ear canal and external ear normal.     Nose: Mucosal edema, congestion and rhinorrhea present.     Right Sinus: No maxillary sinus tenderness or frontal sinus tenderness.     Left Sinus: No maxillary sinus tenderness or frontal sinus tenderness.     Mouth/Throat:     Lips: Pink.     Pharynx: Oropharynx is clear.     Tonsils: Swelling: 0 on the right. 0 on the left.  Neck:     Musculoskeletal: Normal range of motion and neck supple.  Cardiovascular:     Rate and Rhythm: Normal rate and regular rhythm.     Heart sounds: Normal heart sounds.  Pulmonary:     Effort: Pulmonary effort is normal.     Breath sounds: Normal breath sounds.  Musculoskeletal: Normal range of motion.  Neurological:     General: No focal deficit present.     Mental Status: He is alert.  Psychiatric:        Mood and Affect: Mood normal.        Behavior: Behavior normal.    BP (!) 177/84   Pulse 74   Temp (!) 96.8 F (36 C) (Oral)   Ht 5\' 5"  (1.651 m)   Wt 237 lb (107.5 kg)   BMI 39.44 kg/m         Assessment & Plan:  Ricardo Huguenin. in today with chief complaint of Cough and BP high at home   1. Rhinorrhea Force fliuds rest - fluticasone (FLONASE) 50 MCG/ACT nasal spray; Place 2 sprays into both nostrils daily.  Dispense: 16 g; Refill: 6  2. Essential hypertension Dash diet - olmesartan (BENICAR) 40 MG tablet; Take 1 tablet (40 mg total) by mouth daily.  Dispense: 90 tablet; Refill: 0 - amLODipine (NORVASC) 5 MG tablet; Take 1 tablet (5 mg total) by mouth daily.  Dispense: 90 tablet; Refill: 3  Keep follow up appointment next week with Dr. Mayra Neer, FNP

## 2018-10-15 NOTE — Patient Instructions (Signed)
DASH Eating Plan  DASH stands for "Dietary Approaches to Stop Hypertension." The DASH eating plan is a healthy eating plan that has been shown to reduce high blood pressure (hypertension). It may also reduce your risk for type 2 diabetes, heart disease, and stroke. The DASH eating plan may also help with weight loss.  What are tips for following this plan?    General guidelines   Avoid eating more than 2,300 mg (milligrams) of salt (sodium) a day. If you have hypertension, you may need to reduce your sodium intake to 1,500 mg a day.   Limit alcohol intake to no more than 1 drink a day for nonpregnant women and 2 drinks a day for men. One drink equals 12 oz of beer, 5 oz of wine, or 1 oz of hard liquor.   Work with your health care provider to maintain a healthy body weight or to lose weight. Ask what an ideal weight is for you.   Get at least 30 minutes of exercise that causes your heart to beat faster (aerobic exercise) most days of the week. Activities may include walking, swimming, or biking.   Work with your health care provider or diet and nutrition specialist (dietitian) to adjust your eating plan to your individual calorie needs.  Reading food labels     Check food labels for the amount of sodium per serving. Choose foods with less than 5 percent of the Daily Value of sodium. Generally, foods with less than 300 mg of sodium per serving fit into this eating plan.   To find whole grains, look for the word "whole" as the first word in the ingredient list.  Shopping   Buy products labeled as "low-sodium" or "no salt added."   Buy fresh foods. Avoid canned foods and premade or frozen meals.  Cooking   Avoid adding salt when cooking. Use salt-free seasonings or herbs instead of table salt or sea salt. Check with your health care provider or pharmacist before using salt substitutes.   Do not fry foods. Cook foods using healthy methods such as baking, boiling, grilling, and broiling instead.   Cook with  heart-healthy oils, such as olive, canola, soybean, or sunflower oil.  Meal planning   Eat a balanced diet that includes:  ? 5 or more servings of fruits and vegetables each day. At each meal, try to fill half of your plate with fruits and vegetables.  ? Up to 6-8 servings of whole grains each day.  ? Less than 6 oz of lean meat, poultry, or fish each day. A 3-oz serving of meat is about the same size as a deck of cards. One egg equals 1 oz.  ? 2 servings of low-fat dairy each day.  ? A serving of nuts, seeds, or beans 5 times each week.  ? Heart-healthy fats. Healthy fats called Omega-3 fatty acids are found in foods such as flaxseeds and coldwater fish, like sardines, salmon, and mackerel.   Limit how much you eat of the following:  ? Canned or prepackaged foods.  ? Food that is high in trans fat, such as fried foods.  ? Food that is high in saturated fat, such as fatty meat.  ? Sweets, desserts, sugary drinks, and other foods with added sugar.  ? Full-fat dairy products.   Do not salt foods before eating.   Try to eat at least 2 vegetarian meals each week.   Eat more home-cooked food and less restaurant, buffet, and fast food.     When eating at a restaurant, ask that your food be prepared with less salt or no salt, if possible.  What foods are recommended?  The items listed may not be a complete list. Talk with your dietitian about what dietary choices are best for you.  Grains  Whole-grain or whole-wheat bread. Whole-grain or whole-wheat pasta. Brown rice. Oatmeal. Quinoa. Bulgur. Whole-grain and low-sodium cereals. Pita bread. Low-fat, low-sodium crackers. Whole-wheat flour tortillas.  Vegetables  Fresh or frozen vegetables (raw, steamed, roasted, or grilled). Low-sodium or reduced-sodium tomato and vegetable juice. Low-sodium or reduced-sodium tomato sauce and tomato paste. Low-sodium or reduced-sodium canned vegetables.  Fruits  All fresh, dried, or frozen fruit. Canned fruit in natural juice (without  added sugar).  Meat and other protein foods  Skinless chicken or turkey. Ground chicken or turkey. Pork with fat trimmed off. Fish and seafood. Egg whites. Dried beans, peas, or lentils. Unsalted nuts, nut butters, and seeds. Unsalted canned beans. Lean cuts of beef with fat trimmed off. Low-sodium, lean deli meat.  Dairy  Low-fat (1%) or fat-free (skim) milk. Fat-free, low-fat, or reduced-fat cheeses. Nonfat, low-sodium ricotta or cottage cheese. Low-fat or nonfat yogurt. Low-fat, low-sodium cheese.  Fats and oils  Soft margarine without trans fats. Vegetable oil. Low-fat, reduced-fat, or light mayonnaise and salad dressings (reduced-sodium). Canola, safflower, olive, soybean, and sunflower oils. Avocado.  Seasoning and other foods  Herbs. Spices. Seasoning mixes without salt. Unsalted popcorn and pretzels. Fat-free sweets.  What foods are not recommended?  The items listed may not be a complete list. Talk with your dietitian about what dietary choices are best for you.  Grains  Baked goods made with fat, such as croissants, muffins, or some breads. Dry pasta or rice meal packs.  Vegetables  Creamed or fried vegetables. Vegetables in a cheese sauce. Regular canned vegetables (not low-sodium or reduced-sodium). Regular canned tomato sauce and paste (not low-sodium or reduced-sodium). Regular tomato and vegetable juice (not low-sodium or reduced-sodium). Pickles. Olives.  Fruits  Canned fruit in a light or heavy syrup. Fried fruit. Fruit in cream or butter sauce.  Meat and other protein foods  Fatty cuts of meat. Ribs. Fried meat. Bacon. Sausage. Bologna and other processed lunch meats. Salami. Fatback. Hotdogs. Bratwurst. Salted nuts and seeds. Canned beans with added salt. Canned or smoked fish. Whole eggs or egg yolks. Chicken or turkey with skin.  Dairy  Whole or 2% milk, cream, and half-and-half. Whole or full-fat cream cheese. Whole-fat or sweetened yogurt. Full-fat cheese. Nondairy creamers. Whipped toppings.  Processed cheese and cheese spreads.  Fats and oils  Butter. Stick margarine. Lard. Shortening. Ghee. Bacon fat. Tropical oils, such as coconut, palm kernel, or palm oil.  Seasoning and other foods  Salted popcorn and pretzels. Onion salt, garlic salt, seasoned salt, table salt, and sea salt. Worcestershire sauce. Tartar sauce. Barbecue sauce. Teriyaki sauce. Soy sauce, including reduced-sodium. Steak sauce. Canned and packaged gravies. Fish sauce. Oyster sauce. Cocktail sauce. Horseradish that you find on the shelf. Ketchup. Mustard. Meat flavorings and tenderizers. Bouillon cubes. Hot sauce and Tabasco sauce. Premade or packaged marinades. Premade or packaged taco seasonings. Relishes. Regular salad dressings.  Where to find more information:   National Heart, Lung, and Blood Institute: www.nhlbi.nih.gov   American Heart Association: www.heart.org  Summary   The DASH eating plan is a healthy eating plan that has been shown to reduce high blood pressure (hypertension). It may also reduce your risk for type 2 diabetes, heart disease, and stroke.   With the   DASH eating plan, you should limit salt (sodium) intake to 2,300 mg a day. If you have hypertension, you may need to reduce your sodium intake to 1,500 mg a day.   When on the DASH eating plan, aim to eat more fresh fruits and vegetables, whole grains, lean proteins, low-fat dairy, and heart-healthy fats.   Work with your health care provider or diet and nutrition specialist (dietitian) to adjust your eating plan to your individual calorie needs.  This information is not intended to replace advice given to you by your health care provider. Make sure you discuss any questions you have with your health care provider.  Document Released: 07/21/2011 Document Revised: 07/25/2016 Document Reviewed: 07/25/2016  Elsevier Interactive Patient Education  2019 [employer] Inc[employer]

## 2018-10-22 ENCOUNTER — Telehealth: Payer: Self-pay

## 2018-10-22 NOTE — Telephone Encounter (Signed)
Should be able to hold Xarelto the day before and restart the day after the procedure

## 2018-10-22 NOTE — Telephone Encounter (Signed)
Spoke with patient and told him that per Dr. Laurance Flatten, patient can hold Xarelto the day before the procedure and restart the day after.

## 2018-10-22 NOTE — Telephone Encounter (Signed)
-----   Message from Levonne Spiller, RN sent at 10/19/2018  6:51 AM EST ----- Good morning!! I'm looking for the Xarelto hold ok letter... will you help with this? Thank you! Robbin pv

## 2018-10-22 NOTE — Telephone Encounter (Signed)
  10/22/2018   RE: Ricardo Mayer. DOB: 08-16-1940 MRN: [id number]   Dear Dr. Laurance Flatten    We have scheduled the above patient for an endoscopic procedure. Our records show that he is on anticoagulation therapy.   Please advise as to how long the patient may come off his therapy of Xarelto prior to the procedure, which is scheduled for 11/14/2018.  Please fax back/ or route the completed form to [facility] at [phone].   Sincerely,    Phillis Haggis

## 2018-10-24 ENCOUNTER — Ambulatory Visit (INDEPENDENT_AMBULATORY_CARE_PROVIDER_SITE_OTHER): Payer: Medicare Other | Admitting: Family Medicine

## 2018-10-24 ENCOUNTER — Encounter: Payer: Self-pay | Admitting: Family Medicine

## 2018-10-24 ENCOUNTER — Other Ambulatory Visit: Payer: Self-pay

## 2018-10-24 VITALS — BP 138/72 | HR 78 | Temp 96.8°F | Ht 65.0 in | Wt 234.0 lb

## 2018-10-24 DIAGNOSIS — E7800 Pure hypercholesterolemia, unspecified: Secondary | ICD-10-CM

## 2018-10-24 DIAGNOSIS — I82409 Acute embolism and thrombosis of unspecified deep veins of unspecified lower extremity: Secondary | ICD-10-CM

## 2018-10-24 DIAGNOSIS — C439 Malignant melanoma of skin, unspecified: Secondary | ICD-10-CM

## 2018-10-24 DIAGNOSIS — C61 Malignant neoplasm of prostate: Secondary | ICD-10-CM

## 2018-10-24 DIAGNOSIS — E1159 Type 2 diabetes mellitus with other circulatory complications: Secondary | ICD-10-CM | POA: Diagnosis not present

## 2018-10-24 DIAGNOSIS — I10 Essential (primary) hypertension: Secondary | ICD-10-CM | POA: Diagnosis not present

## 2018-10-24 DIAGNOSIS — E559 Vitamin D deficiency, unspecified: Secondary | ICD-10-CM | POA: Diagnosis not present

## 2018-10-24 DIAGNOSIS — E119 Type 2 diabetes mellitus without complications: Secondary | ICD-10-CM

## 2018-10-24 DIAGNOSIS — E1151 Type 2 diabetes mellitus with diabetic peripheral angiopathy without gangrene: Secondary | ICD-10-CM | POA: Diagnosis not present

## 2018-10-24 DIAGNOSIS — N183 Chronic kidney disease, stage 3 unspecified: Secondary | ICD-10-CM

## 2018-10-24 DIAGNOSIS — I739 Peripheral vascular disease, unspecified: Secondary | ICD-10-CM

## 2018-10-24 DIAGNOSIS — D692 Other nonthrombocytopenic purpura: Secondary | ICD-10-CM

## 2018-10-24 DIAGNOSIS — I70209 Unspecified atherosclerosis of native arteries of extremities, unspecified extremity: Secondary | ICD-10-CM

## 2018-10-24 LAB — BAYER DCA HB A1C WAIVED: HB A1C (BAYER DCA - WAIVED): 5.9 % (ref <7.0)

## 2018-10-24 NOTE — Patient Instructions (Addendum)
Medicare Annual Wellness Visit  [facility] and the medical providers at [facility] strive to bring you the best medical care.  In doing so we not only want to address your current medical conditions and concerns but also to detect new conditions early and prevent illness, disease and health-related problems.    Medicare offers a yearly Wellness Visit which allows our clinical staff to assess your need for preventative services including immunizations, lifestyle education, counseling to decrease risk of preventable diseases and screening for fall risk and other medical concerns.    This visit is provided free of charge (no copay) for all Medicare recipients. The clinical pharmacists at [facility] have begun to conduct these Wellness Visits which will also include a thorough review of all your medications.    As you primary medical provider recommend that you make an appointment for your Annual Wellness Visit if you have not done so already this year.  You may set up this appointment before you leave today or you may call back ([phone]) and schedule an appointment.  Please make sure when you call that you mention that you are scheduling your Annual Wellness Visit with the clinical pharmacist so that the appointment may be made for the proper length of time.    Continue current medications. Continue good therapeutic lifestyle changes which include good diet and exercise. Fall precautions discussed with patient. If an FOBT was given today- please return it to our front desk. If you are over 68 years old - you may need Prevnar 15 or the adult Pneumonia vaccine.  **Flu shots are available--- please call and schedule a FLU-CLINIC appointment**  After your visit with [name] today you will receive a survey in the mail or online from [employer] regarding your care with [name]. Please take a moment to fill this out. Your feedback is very  important to [name] as you can help [name] better understand your patient needs as well as improve your experience and satisfaction. WE CARE ABOUT YOU!!!   We will call with lab work as soon as that becomes available Continue to practice good hand hygiene and respiratory hygiene especially in the midst of this coronavirus outbreak Stay out of crowds of people and wash and clean hands regularly Continue to be careful and do not put self at risk for falling by avoiding climbing etc. Drink plenty of water eat healthy Follow-up with urology cardiology Please bring blood pressure readings by for review in a couple of weeks and we will make any adjustments if necessary at that time and will also discuss any changes with your nephrologist. Watch sodium intake closely Continue to monitor blood sugars.

## 2018-10-24 NOTE — Progress Notes (Signed)
Subjective:    Patient ID: Ricardo Huguenin., male    DOB: [birth year], 78 y.o.   MRN: [id number]  HPI Pt here for follow up and management of chronic medical problems which includes diabetes and hypertension. He is taking medication regularly.  The patient is doing well today.  For some reason there was some confusion regarding blood pressure medicine and cholesterol medicine and he did not take his blood pressure medicine for a few days following a recent visit to 1 of the other providers.  He is back on his medications now and everything is back to where it should be.  His vital signs are stable his weight is down 3 pounds since the last visit.  He does have chronic kidney disease and it is important that he keep his blood pressure under good control.  He will get lab work today and will be given an FOBT to return.  The patient today is doing well overall.  He has had some issues with his blood pressure being high and for some reason he got confused with the medications.  He recently saw his nephrologist.  His readings at home have been running in the 160s over the 70s in the morning and the 150s over the 70s in the evening.  He denies any chest pain pressure tightness or shortness of breath.  He is scheduled to get a colonoscopy in early April.  He denies any problems with swallowing heartburn indigestion nausea vomiting diarrhea blood in the stool or black tarry bowel movements.  He is passing his water well.  He continues to see Dr. Jeffie Pollock regularly because of his prostate cancer.    Patient Active Problem List   Diagnosis Date Noted  . Cellulitis 07/12/2017  . Recurrent deep vein thrombosis (DVT) ([place]) 07/10/2017  . Localized edema 07/07/2017  . Pure hypercholesterolemia 07/13/2016  . Essential hypertension 07/13/2016  . Vitamin D deficiency 07/13/2016  . Morbid obesity ([place]) 05/27/2016  . Anemia of chronic disease   . LOC (loss of consciousness) ([place]) 04/08/2016  . TIA (transient ischemic  attack) 04/08/2016  . 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 S/P knee replacement 03/21/2016  . Anemia associated with chronic renal failure 10/28/2015  . Hyperparathyroidism , secondary, non-renal ([place]) 10/28/2015  . Pulmonary emboli ([place]) 08/27/2015  . Near syncope 08/25/2015  . DOE (dyspnea on exertion) 06/02/2015  . Type 2 diabetes mellitus with vascular disease ([place]) 06/02/2015  . Melanoma of skin ([place]) 10/09/2013  . 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 Chronic kidney disease, stage III (moderate) ([place]) 10/09/2013  . Peripheral vascular insufficiency ([place]) 10/09/2013  . Statin intolerance 10/09/2013  . Prostate cancer ([place]) 12/25/2012   Outpatient Encounter Medications as of 10/24/2018  Medication Sig  . amLODipine (NORVASC) 5 MG tablet Take 1 tablet (5 mg total) by mouth daily.  Marland Kitchen atorvastatin (LIPITOR) 10 MG tablet Take 1 tablet (10 mg total) by mouth daily.  . blood glucose meter kit and supplies KIT Dispense based on patient and insurance preference. Use up to four times daily as directed. (FOR ICD-9 250.00, 250.01).  . fluticasone (FLONASE) 50 MCG/ACT nasal spray Place 2 sprays into both nostrils daily.  Marland Kitchen glucose blood (ONE TOUCH ULTRA TEST) test strip Test up to tid. DX E11.9  . metFORMIN (GLUCOPHAGE) 500 MG tablet Take 0.5 tablets (250 mg total) by mouth daily with breakfast.  . olmesartan (BENICAR) 40 MG tablet Take 1 tablet (40 mg total) by  mouth daily.  Alveda Reasons 20 MG TABS tablet TAKE 1 TABLET BY MOUTH ONCE DAILY WITH FOOD  . furosemide (LASIX) 20 MG tablet TAKE 1 TABLET BY MOUTH DAILY AS NEEDED (Patient not taking: Reported on 10/24/2018)   No facility-administered encounter medications on file as of 10/24/2018.      Review of Systems  Constitutional: Negative.   HENT: Negative.   Eyes: Negative.   Respiratory: Negative.   Cardiovascular: Negative.        Recent elevated BP (had been off of meds, but restarted after MMM appt)    Gastrointestinal: Negative.   Endocrine: Negative.   Genitourinary: Negative.   Musculoskeletal: Negative.   Skin: Negative.   Allergic/Immunologic: Negative.   Neurological: Negative.   Hematological: Negative.   Psychiatric/Behavioral: Negative.        Objective:   Physical Exam Vitals signs and nursing note reviewed.  Constitutional:      General: He is not in acute distress.    Appearance: Normal appearance. He is well-developed. He is obese.  HENT:     Head: Normocephalic and atraumatic.     Right Ear: Tympanic membrane, ear canal and external ear normal. There is no impacted cerumen.     Left Ear: Tympanic membrane, ear canal and external ear normal. There is no impacted cerumen.     Nose: Nose normal. No congestion.     Mouth/Throat:     Mouth: Mucous membranes are moist.     Pharynx: Oropharynx is clear. No oropharyngeal exudate.  Eyes:     General: No scleral icterus.       Right eye: No discharge.        Left eye: No discharge.     Extraocular Movements: Extraocular movements intact.     Conjunctiva/sclera: Conjunctivae normal.     Pupils: Pupils are equal, round, and reactive to light.     Comments: Is up-to-date on his eye exams  Neck:     Musculoskeletal: Normal range of motion and neck supple. No muscular tenderness.     Thyroid: No thyromegaly.     Vascular: No carotid bruit.     Trachea: No tracheal deviation.  Cardiovascular:     Rate and Rhythm: Normal rate and regular rhythm.     Heart sounds: Normal heart sounds. No murmur.     Comments: Is regular at 84/min.  He has a history of chronic DVT and thrombophlebitis with pulmonary emboli.  He wears support hose on his lower extremities.  There is no edema today and pulses are slightly diminished in the left foot compared to the right but present bilaterally. Pulmonary:     Effort: Pulmonary effort is normal. No respiratory distress.     Breath sounds: Normal breath sounds. No wheezing or rales.      Comments: Clear anteriorly and posteriorly no axillary adenopathy or chest wall masses or tenderness. Chest:     Chest wall: No tenderness.  Abdominal:     General: Bowel sounds are normal.     Palpations: Abdomen is soft. There is no mass.     Tenderness: There is no abdominal tenderness. There is no guarding.     Comments: Abdominal obesity without masses tenderness organ enlargement or bruits  Genitourinary:    Comments: Followed regularly by Dr. Irine Seal his urologist Musculoskeletal: Normal range of motion.        General: No tenderness.     Right lower leg: No edema.     Left lower leg: No  edema.  Lymphadenopathy:     Cervical: No cervical adenopathy.  Skin:    General: Skin is warm and dry.     Findings: No rash.     Comments: Venous stasis and history of chronic DVT on Xarelto.  Neurological:     General: No focal deficit present.     Mental Status: He is alert and oriented to person, place, and time. Mental status is at baseline.     Cranial Nerves: No cranial nerve deficit.     Sensory: No sensory deficit.     Gait: Gait normal.     Deep Tendon Reflexes: Reflexes are normal and symmetric. Reflexes normal.  Psychiatric:        Mood and Affect: Mood normal.        Behavior: Behavior normal.        Thought Content: Thought content normal.        Judgment: Judgment normal.     Comments: Mood affect and behavior are all normal for this patient     BP 138/72 (BP Location: Left Arm)   Pulse 78   Temp (!) 96.8 F (36 C) (Oral)   Ht '5\' 5"'  (1.651 m)   Wt 234 lb (106.1 kg)   BMI 38.94 kg/m        Assessment & Plan:  1. Essential hypertension -The blood pressure was good this morning but patient says it has been running higher at home.  We will continue to monitor readings and have him bring these by for review in a couple of weeks and if they remain elevated we will discuss any treatment change meds with his nephrologist at that time.  Patient will continue to watch  sodium intake.  Patient has not had blood pressure medicine this morning.  He was told in the future to take his blood pressure medicine with water before having his visit to the office. - BMP8+EGFR - CBC with Differential/Platelet - Hepatic function panel  2. Type 2 diabetes mellitus with vascular disease ([place]) -Continue current treatment pending results of lab work - CBC with Differential/Platelet - Bayer DCA Hb A1c Waived  3. Pure hypercholesterolemia -Continue current treatment and therapeutic lifestyle changes pending results of lab work - CBC with Differential/Platelet - Lipid panel  4. Chronic kidney disease, stage III (moderate) ([facility]) -Continue follow-up with Dr. Posey Pronto his nephrologist - BMP8+EGFR - CBC with Differential/Platelet  5. Vitamin D deficiency -Continue vitamin D replacement pending results of lab work and at the directions of his nephrologist - CBC with Differential/Platelet - VITAMIN D 25 Hydroxy (Vit-D Deficiency, Fractures)  6. Prostate cancer [facility]) -Follow-up with Dr. Jeffie Pollock as planned - CBC with Differential/Platelet  7. Peripheral vascular insufficiency ([facility]) -Diminished dorsalis pedis and left foot compared to right - CBC with Differential/Platelet  8. Morbid obesity ([place]) -Continue aggressive therapeutic lifestyle changes including diet and exercise to achieve weight loss - CBC with Differential/Platelet  9. Controlled type 2 diabetes mellitus without complication, without long-term current use of insulin ([facility]) -Continue diet and exercise to achieve weight loss - CBC with Differential/Platelet  10. Melanoma of skin ([place]) -Follow-up with dermatology as planned - CBC with Differential/Platelet  11. Recurrent deep vein thrombosis (DVT) ([facility]) -Continue with Xarelto but hold Xarelto prior to and during colonoscopy date. - CBC with Differential/Platelet  12. 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 Senile  purpura ([place]) -These are still present on both arms. - CBC with  Patient Instructions                       Medicare Annual Wellness Visit  [facility] and the medical providers at [facility] strive to bring you the best medical care.  In doing so we not only want to address your current medical conditions and concerns but also to detect new conditions early and prevent illness, disease and health-related problems.    Medicare offers a yearly Wellness Visit which allows our clinical staff to assess your need for preventative services including immunizations, lifestyle education, counseling to decrease risk of preventable diseases and screening for fall risk and other medical concerns.    This visit is provided free of charge (no copay) for all Medicare recipients. The clinical pharmacists at [facility] have begun to conduct these Wellness Visits which will also include a thorough review of all your medications.    As you primary medical provider recommend that you make an appointment for your Annual Wellness Visit if you have not done so already this year.  You may set up this appointment before you leave today or you may call back ([phone]) and schedule an appointment.  Please make sure when you call that you mention that you are scheduling your Annual Wellness Visit with the clinical pharmacist so that the appointment may be made for the proper length of time.    Continue current medications. Continue good therapeutic lifestyle changes which include good diet and exercise. Fall precautions discussed with patient. If an FOBT was given today- please return it to our front desk. If you are over 50 years old - you may need Prevnar 71 or the adult Pneumonia vaccine.  **Flu shots are available--- please call and schedule a  FLU-CLINIC appointment**  After your visit with [name] today you will receive a survey in the mail or online from [employer] regarding your care with [name]. Please take a moment to fill this out. Your feedback is very important to [name] as you can help [name] better understand your patient needs as well as improve your experience and satisfaction. WE CARE ABOUT YOU!!!   We will call with lab work as soon as that becomes available Continue to practice good hand hygiene and respiratory hygiene especially in the midst of this coronavirus outbreak Stay out of crowds of people and wash and clean hands regularly Continue to be careful and do not put self at risk for falling by avoiding climbing etc. Drink plenty of water eat healthy Follow-up with urology cardiology Please bring blood pressure readings by for review in a couple of weeks and we will make any adjustments if necessary at that time and will also discuss any changes with your nephrologist. Watch sodium intake closely Continue to monitor blood sugars.  Arrie Senate MD

## 2018-10-25 LAB — CBC WITH DIFFERENTIAL/PLATELET
Basophils Absolute: 0 10*3/uL (ref 0.0–0.2)
Basos: 0 %
EOS (ABSOLUTE): 0.1 10*3/uL (ref 0.0–0.4)
Eos: 2 %
Hematocrit: 44.1 % (ref 37.5–51.0)
Hemoglobin: 14.9 g/dL (ref 13.0–17.7)
Immature Grans (Abs): 0 10*3/uL (ref 0.0–0.1)
Immature Granulocytes: 0 %
LYMPHS ABS: 2.3 10*3/uL (ref 0.7–3.1)
Lymphs: 30 %
MCH: 30.4 pg (ref 26.6–33.0)
MCHC: 33.8 g/dL (ref 31.5–35.7)
MCV: 90 fL (ref 79–97)
Monocytes Absolute: 0.6 10*3/uL (ref 0.1–0.9)
Monocytes: 7 %
Neutrophils Absolute: 4.7 10*3/uL (ref 1.4–7.0)
Neutrophils: 61 %
Platelets: 165 10*3/uL (ref 150–450)
RBC: 4.9 x10E6/uL (ref 4.14–5.80)
RDW: 14.2 % (ref 11.6–15.4)
WBC: 7.6 10*3/uL (ref 3.4–10.8)

## 2018-10-25 LAB — HEPATIC FUNCTION PANEL
ALT: 21 IU/L (ref 0–44)
AST: 20 IU/L (ref 0–40)
Albumin: 4 g/dL (ref 3.7–4.7)
Alkaline Phosphatase: 76 IU/L (ref 39–117)
Bilirubin Total: 0.3 mg/dL (ref 0.0–1.2)
Bilirubin, Direct: 0.09 mg/dL (ref 0.00–0.40)
TOTAL PROTEIN: 6 g/dL (ref 6.0–8.5)

## 2018-10-25 LAB — LIPID PANEL
Chol/HDL Ratio: 2.8 ratio (ref 0.0–5.0)
Cholesterol, Total: 110 mg/dL (ref 100–199)
HDL: 39 mg/dL — ABNORMAL LOW (ref >39)
LDL Calculated: 46 mg/dL (ref 0–99)
Triglycerides: 124 mg/dL (ref 0–149)
VLDL Cholesterol Cal: 25 mg/dL (ref 5–40)

## 2018-10-25 LAB — BMP8+EGFR
BUN/Creatinine Ratio: 25 — ABNORMAL HIGH (ref 10–24)
BUN: 52 mg/dL — ABNORMAL HIGH (ref 8–27)
CO2: 18 mmol/L — ABNORMAL LOW (ref 20–29)
Calcium: 9.2 mg/dL (ref 8.6–10.2)
Chloride: 105 mmol/L (ref 96–106)
Creatinine, Ser: 2.12 mg/dL — ABNORMAL HIGH (ref 0.76–1.27)
GFR calc Af Amer: 34 mL/min/{1.73_m2} — ABNORMAL LOW (ref >59)
GFR calc non Af Amer: 29 mL/min/{1.73_m2} — ABNORMAL LOW (ref >59)
Glucose: 119 mg/dL — ABNORMAL HIGH (ref 65–99)
Potassium: 4.7 mmol/L (ref 3.5–5.2)
Sodium: 139 mmol/L (ref 134–144)

## 2018-10-25 LAB — VITAMIN D 25 HYDROXY (VIT D DEFICIENCY, FRACTURES): VIT D 25 HYDROXY: 15.9 ng/mL — AB (ref 30.0–100.0)

## 2018-10-30 ENCOUNTER — Telehealth: Payer: Self-pay | Admitting: *Deleted

## 2018-10-30 NOTE — Telephone Encounter (Signed)
Covid-19 travel screening questions  Have you traveled in the last 14 days? no If yes where?  Do you now or have you had a fever in the last 14 days? no  Do you have any respiratory symptoms of shortness of breath or cough now or in the last 14 days? Had a "head" cold "about" 2 weeks ago- was by family doctor- no fever or cough  Do you have a medical history of Congestive Heart Failure? no  Do you have a medical history of lung disease? no  Do you have any family members or close contacts with diagnosed or suspected Covid-19? no      Scars in lungs from 2018 from PE

## 2018-10-30 NOTE — Telephone Encounter (Signed)
Beyond 2 weeks.  No fever.  Feeling better.  This is not coronavirus.  I noticed that the exam is set for April.  Okay for now but may need to reschedule closer to the date given his age and the elective nature of the procedure, depending upon our procedures and policies at that time

## 2018-10-31 ENCOUNTER — Ambulatory Visit (AMBULATORY_SURGERY_CENTER): Payer: Self-pay | Admitting: *Deleted

## 2018-10-31 ENCOUNTER — Other Ambulatory Visit: Payer: Self-pay

## 2018-10-31 VITALS — Temp 97.3°F | Ht 67.0 in | Wt 242.0 lb

## 2018-10-31 DIAGNOSIS — Z86010 Personal history of colonic polyps: Secondary | ICD-10-CM

## 2018-10-31 MED ORDER — NA SULFATE-K SULFATE-MG SULF 17.5-3.13-1.6 GM/177ML PO SOLN: 1.0000 | Freq: Once | ORAL | 0 refills | Status: AC

## 2018-10-31 NOTE — Progress Notes (Signed)
No egg or soy allergy known to patient  No issues with past sedation with any surgeries  or procedures, no intubation problems  No diet pills per patient No home 02 use per patient  Yes to blood thinners per patient - pt to hold Xarelto x 1day per OV notes - last dose Monday 3-30, hold tues 3-31 per PCP  Pt denies issues with constipation  No A fib or A flutter   EMMI video sent to pt's e mail -declined

## 2018-11-07 ENCOUNTER — Telehealth: Payer: Self-pay | Admitting: *Deleted

## 2018-11-07 NOTE — Telephone Encounter (Signed)
Spoke with patient's wife regarding cancelling his procedure for April 1 r/t COVID 19. She verbalized understanding. SM

## 2018-11-14 ENCOUNTER — Encounter: Payer: Medicare Other | Admitting: Internal Medicine

## 2018-11-18 ENCOUNTER — Other Ambulatory Visit: Payer: Self-pay | Admitting: Family Medicine

## 2018-11-19 ENCOUNTER — Other Ambulatory Visit: Payer: Self-pay | Admitting: *Deleted

## 2018-11-20 ENCOUNTER — Other Ambulatory Visit: Payer: Self-pay

## 2018-11-20 ENCOUNTER — Other Ambulatory Visit: Payer: Medicare Other

## 2018-11-20 ENCOUNTER — Telehealth: Payer: Self-pay | Admitting: Family Medicine

## 2018-11-20 ENCOUNTER — Other Ambulatory Visit: Payer: Self-pay | Admitting: *Deleted

## 2018-11-20 DIAGNOSIS — R7989 Other specified abnormal findings of blood chemistry: Secondary | ICD-10-CM

## 2018-11-20 DIAGNOSIS — N183 Chronic kidney disease, stage 3 unspecified: Secondary | ICD-10-CM

## 2018-11-20 LAB — BMP8+EGFR
BUN/Creatinine Ratio: 13 (ref 10–24)
BUN: 24 mg/dL (ref 8–27)
CO2: 21 mmol/L (ref 20–29)
Calcium: 9.4 mg/dL (ref 8.6–10.2)
Chloride: 105 mmol/L (ref 96–106)
Creatinine, Ser: 1.87 mg/dL — ABNORMAL HIGH (ref 0.76–1.27)
GFR calc Af Amer: 39 mL/min/{1.73_m2} — ABNORMAL LOW (ref >59)
GFR calc non Af Amer: 34 mL/min/{1.73_m2} — ABNORMAL LOW (ref >59)
Glucose: 127 mg/dL — ABNORMAL HIGH (ref 65–99)
Potassium: 5 mmol/L (ref 3.5–5.2)
Sodium: 139 mmol/L (ref 134–144)

## 2018-11-20 NOTE — Telephone Encounter (Signed)
Aware to come and get BMP this week - due to their not being many pts here. Will drop off readings while he is here.

## 2018-11-21 ENCOUNTER — Telehealth: Payer: Self-pay | Admitting: Family Medicine

## 2018-11-21 ENCOUNTER — Other Ambulatory Visit: Payer: Self-pay | Admitting: *Deleted

## 2018-11-21 MED ORDER — FUROSEMIDE 20 MG PO TABS: 30.0000 mg | ORAL_TABLET | Freq: Every day | ORAL | 1 refills | Status: DC | PRN

## 2018-11-21 MED ORDER — ATORVASTATIN CALCIUM 10 MG PO TABS: 10.0000 mg | ORAL_TABLET | Freq: Every day | ORAL | 1 refills | Status: DC

## 2018-11-21 MED ORDER — RIVAROXABAN 20 MG PO TABS: ORAL_TABLET | ORAL | 0 refills | Status: DC

## 2018-11-21 NOTE — Telephone Encounter (Signed)
Pt wife has called states that [facility] Mayer is telling them that they have not received the rxs that we sent on 4/6 can we resend please. And let pt wife know when resent

## 2018-11-21 NOTE — Telephone Encounter (Signed)
Meds resent to pharmacy. Left detailed message on patients voicemail

## 2018-12-10 ENCOUNTER — Telehealth: Payer: Self-pay | Admitting: *Deleted

## 2018-12-10 NOTE — Telephone Encounter (Signed)
Called to reschedule patient for procedure that was previously cancelled due to Covid-19. Now scheduled for 12/17/18 0800. Pt has prep, instructions redone and mailed to patient.

## 2018-12-13 ENCOUNTER — Other Ambulatory Visit: Payer: Medicare Other

## 2018-12-13 ENCOUNTER — Other Ambulatory Visit: Payer: Self-pay

## 2018-12-13 DIAGNOSIS — N183 Chronic kidney disease, stage 3 unspecified: Secondary | ICD-10-CM

## 2018-12-14 LAB — BMP8+EGFR
BUN/Creatinine Ratio: 16 (ref 10–24)
BUN: 34 mg/dL — ABNORMAL HIGH (ref 8–27)
CO2: 24 mmol/L (ref 20–29)
Calcium: 9.6 mg/dL (ref 8.6–10.2)
Chloride: 106 mmol/L (ref 96–106)
Creatinine, Ser: 2.17 mg/dL — ABNORMAL HIGH (ref 0.76–1.27)
GFR calc Af Amer: 33 mL/min/{1.73_m2} — ABNORMAL LOW (ref >59)
GFR calc non Af Amer: 28 mL/min/{1.73_m2} — ABNORMAL LOW (ref >59)
Glucose: 106 mg/dL — ABNORMAL HIGH (ref 65–99)
Potassium: 5.2 mmol/L (ref 3.5–5.2)
Sodium: 140 mmol/L (ref 134–144)

## 2018-12-16 ENCOUNTER — Telehealth: Payer: Self-pay | Admitting: *Deleted

## 2018-12-16 NOTE — Telephone Encounter (Signed)
Covid-19 travel screening questions  Have you traveled in the last 14 days?no If yes where?  Do you now or have you had a fever in the last 14 days?no  Do you have any respiratory symptoms of shortness of breath or cough now or in the last 14 days?nothing new  Do you have a medical history of Congestive Heart Failure?  Do you have a medical history of lung disease?  Do you have any family members or close contacts with diagnosed or suspected Covid-19?no    Pt and wife notified of care partner policy and was told to bring a mask if the patient has one. Care partner verbalized understanding no further questions.

## 2018-12-17 ENCOUNTER — Ambulatory Visit (AMBULATORY_SURGERY_CENTER): Payer: Medicare Other | Admitting: Internal Medicine

## 2018-12-17 ENCOUNTER — Other Ambulatory Visit: Payer: Self-pay

## 2018-12-17 ENCOUNTER — Encounter: Payer: Self-pay | Admitting: Internal Medicine

## 2018-12-17 VITALS — BP 151/93 | HR 74 | Temp 98.0°F | Resp 11 | Ht 67.0 in | Wt 242.0 lb

## 2018-12-17 DIAGNOSIS — Z860100 Personal history of colon polyps, unspecified: Secondary | ICD-10-CM

## 2018-12-17 DIAGNOSIS — I10 Essential (primary) hypertension: Secondary | ICD-10-CM | POA: Diagnosis not present

## 2018-12-17 DIAGNOSIS — D122 Benign neoplasm of ascending colon: Secondary | ICD-10-CM

## 2018-12-17 DIAGNOSIS — E119 Type 2 diabetes mellitus without complications: Secondary | ICD-10-CM | POA: Diagnosis not present

## 2018-12-17 DIAGNOSIS — R55 Syncope and collapse: Secondary | ICD-10-CM | POA: Diagnosis not present

## 2018-12-17 MED ORDER — SODIUM CHLORIDE 0.9 % IV SOLN: 500.0000 mL | INTRAVENOUS | Status: DC

## 2018-12-17 NOTE — Op Note (Signed)
[facility] Patient Name: Ricardo Mayer Procedure Date: 12/17/2018 8:04 AM MRN: [id number] Endoscopist: Docia Chuck. Henrene Pastor , MD Age: 78 Referring MD:  Date of Birth: 1941-06-08 Gender: Male Account #: [id number] Procedure:                Colonoscopy with cold snare polypectomy x 2 Indications:              High risk colon cancer surveillance: Personal                            history of multiple (3 or more) adenomas. Previous                            examinations 2004, 2010, 2013 Medicines:                Monitored Anesthesia Care Procedure:                Pre-Anesthesia Assessment:                           - Prior to the procedure, a History and Physical                            was performed, and patient medications and                            allergies were reviewed. The patient's tolerance of                            previous anesthesia was also reviewed. The risks                            and benefits of the procedure and the sedation                            options and risks were discussed with the patient.                            All questions were answered, and informed consent                            was obtained. Prior Anticoagulants: The patient has                            taken Xarelto (rivaroxaban), last dose was 3 days                            prior to procedure. ASA Grade Assessment: III - A                            patient with severe systemic disease. After                            reviewing the risks and benefits, the patient was  deemed in satisfactory condition to undergo the                            procedure.                           After obtaining informed consent, the colonoscope                            was passed under direct vision. Throughout the                            procedure, the patient's blood pressure, pulse, and                            oxygen saturations were monitored  continuously. The                            Colonoscope was introduced through the anus and                            advanced to the the cecum, identified by                            appendiceal orifice and ileocecal valve. The                            ileocecal valve, appendiceal orifice, and rectum                            were photographed. The quality of the bowel                            preparation was excellent. The colonoscopy was                            performed without difficulty. The patient tolerated                            the procedure well. The bowel preparation used was                            SUPREP via split dose instruction. Scope In: 8:18:08 AM Scope Out: 8:29:23 AM Scope Withdrawal Time: 0 hours 8 minutes 42 seconds  Total Procedure Duration: 0 hours 11 minutes 15 seconds  Findings:                 Two polyps were found in the ascending colon. The                            polyps were 2 to 3 mm in size. These polyps were                            removed with a cold snare. Resection and retrieval  were complete.                           Internal hemorrhoids were found during retroflexion.                           The exam was otherwise without abnormality on                            direct and retroflexion views. Complications:            No immediate complications. Estimated blood loss:                            None. Estimated Blood Loss:     Estimated blood loss: none. Impression:               - Two 2 to 3 mm polyps in the ascending colon,                            removed with a cold snare. Resected and retrieved.                           - Internal hemorrhoids.                           - The examination was otherwise normal on direct                            and retroflexion views. Recommendation:           - Repeat colonoscopy is not recommended for                            surveillance.                            - Resume Xarelto (rivaroxaban) today at prior dose.                           - Patient has a contact number available for                            emergencies. The signs and symptoms of potential                            delayed complications were discussed with the                            patient. Return to normal activities tomorrow.                            Written discharge instructions were provided to the                            patient.                           -  Resume previous diet.                           - Continue present medications.                           - Await pathology results. Docia Chuck. Henrene Pastor, MD 12/17/2018 8:35:40 AM This report has been signed electronically.

## 2018-12-17 NOTE — Progress Notes (Signed)
Report to PACU, RN, vss, BBS= Clear.  

## 2018-12-17 NOTE — Progress Notes (Signed)
Called to room to assist during endoscopic procedure.  Patient ID and intended procedure confirmed with present staff. Received instructions for my participation in the procedure from the performing physician.  

## 2018-12-17 NOTE — Patient Instructions (Signed)
Information on polyps and hemorrhoids given to you today.  Await pathology results.  Resume Xarelto today at prior dose.  YOU HAD AN ENDOSCOPIC PROCEDURE TODAY AT [facility]:   Refer to the procedure report that was given to you for any specific questions about what was found during the examination.  If the procedure report does not answer your questions, please call your gastroenterologist to clarify.  If you requested that your care partner not be given the details of your procedure findings, then the procedure report has been included in a sealed envelope for you to review at your convenience later.  YOU SHOULD EXPECT: Some feelings of bloating in the abdomen. Passage of more gas than usual.  Walking can help get rid of the air that was put into your GI tract during the procedure and reduce the bloating. If you had a lower endoscopy (such as a colonoscopy or flexible sigmoidoscopy) you may notice spotting of blood in your stool or on the toilet paper. If you underwent a bowel prep for your procedure, you may not have a normal bowel movement for a few days.  Please Note:  You might notice some irritation and congestion in your nose or some drainage.  This is from the oxygen used during your procedure.  There is no need for concern and it should clear up in a day or so.  SYMPTOMS TO REPORT IMMEDIATELY:   Following lower endoscopy (colonoscopy or flexible sigmoidoscopy):  Excessive amounts of blood in the stool  Significant tenderness or worsening of abdominal pains  Swelling of the abdomen that is new, acute  Fever of 100F or higher   For urgent or emergent issues, a gastroenterologist can be reached at any hour by calling [phone].   DIET:  We do recommend a small meal at first, but then you may proceed to your regular diet.  Drink plenty of fluids but you should avoid alcoholic beverages for 24 hours.  ACTIVITY:  You should plan to take it easy for the rest of  today and you should NOT DRIVE or use heavy machinery until tomorrow (because of the sedation medicines used during the test).    FOLLOW UP: Our staff will call the number listed on your records the next business day following your procedure to check on you and address any questions or concerns that you may have regarding the information given to you following your procedure. If we do not reach you, we will leave a message.  However, if you are feeling well and you are not experiencing any problems, there is no need to return our call.  We will assume that you have returned to your regular daily activities without incident.  If any biopsies were taken you will be contacted by phone or by letter within the next 1-3 weeks.  Please call us at [phone] if you have not heard about the biopsies in 3 weeks.    SIGNATURES/CONFIDENTIALITY: You and/or your care partner have signed paperwork which will be entered into your electronic medical record.  These signatures attest to the fact that that the information above on your After Visit Summary has been reviewed and is understood.  Full responsibility of the confidentiality of this discharge information lies with you and/or your care-partner.

## 2018-12-18 ENCOUNTER — Telehealth: Payer: Self-pay

## 2018-12-18 NOTE — Telephone Encounter (Signed)
  Follow up Call-  Call back number 12/17/2018  Post procedure Call Back phone  # [phone]  Permission to leave phone message Yes  Some recent data might be hidden     Patient questions:  Do you have a fever, pain , or abdominal swelling? No. Pain Score  0 *  Have you tolerated food without any problems? Yes.    Have you been able to return to your normal activities? Yes.    Do you have any questions about your discharge instructions: Diet   No. Medications  No. Follow up visit  No.  Do you have questions or concerns about your Care? No.  Actions: * If pain score is 4 or above: No action needed, pain <4.

## 2018-12-19 ENCOUNTER — Encounter: Payer: Self-pay | Admitting: Internal Medicine

## 2018-12-26 ENCOUNTER — Telehealth: Payer: Self-pay | Admitting: *Deleted

## 2018-12-26 NOTE — Telephone Encounter (Signed)
1. Have you developed a fever since your procedure? No.  2.   Have you had an respiratory symptoms (SOB or cough) since your procedure? No.  3.   Have you tested positive for COVID 19 since your procedure No.  3.   Have you had any family members/close contacts diagnosed with the COVID 19 since your procedure?  No   If any of these questions are a yes, please inquire if patient has been seen by family doctor and route this note to Raghav Verrilli Walton, RN. 

## 2019-01-10 ENCOUNTER — Other Ambulatory Visit: Payer: Self-pay | Admitting: Nurse Practitioner

## 2019-01-10 DIAGNOSIS — I10 Essential (primary) hypertension: Secondary | ICD-10-CM

## 2019-01-29 ENCOUNTER — Other Ambulatory Visit: Payer: Medicare Other

## 2019-01-29 ENCOUNTER — Other Ambulatory Visit: Payer: Self-pay

## 2019-01-29 DIAGNOSIS — N183 Chronic kidney disease, stage 3 (moderate): Secondary | ICD-10-CM | POA: Diagnosis not present

## 2019-02-05 ENCOUNTER — Telehealth: Payer: Self-pay | Admitting: Family Medicine

## 2019-02-05 DIAGNOSIS — H9193 Unspecified hearing loss, bilateral: Secondary | ICD-10-CM

## 2019-02-05 NOTE — Telephone Encounter (Signed)
Referral placed, pt aware

## 2019-02-07 ENCOUNTER — Telehealth: Payer: Self-pay | Admitting: Family Medicine

## 2019-02-08 DIAGNOSIS — D631 Anemia in chronic kidney disease: Secondary | ICD-10-CM | POA: Diagnosis not present

## 2019-02-08 DIAGNOSIS — I129 Hypertensive chronic kidney disease with stage 1 through stage 4 chronic kidney disease, or unspecified chronic kidney disease: Secondary | ICD-10-CM | POA: Diagnosis not present

## 2019-02-08 DIAGNOSIS — N2581 Secondary hyperparathyroidism of renal origin: Secondary | ICD-10-CM | POA: Diagnosis not present

## 2019-02-08 DIAGNOSIS — N183 Chronic kidney disease, stage 3 (moderate): Secondary | ICD-10-CM | POA: Diagnosis not present

## 2019-02-08 NOTE — Telephone Encounter (Signed)
Appointment was scheduled with [facility] in [place] on 03/07/2019 at 2:00 pm.  Patient notified via letter.

## 2019-02-11 ENCOUNTER — Other Ambulatory Visit (HOSPITAL_COMMUNITY): Payer: Self-pay | Admitting: Nephrology

## 2019-02-11 ENCOUNTER — Other Ambulatory Visit: Payer: Self-pay | Admitting: Nephrology

## 2019-02-11 DIAGNOSIS — N179 Acute kidney failure, unspecified: Secondary | ICD-10-CM

## 2019-02-14 ENCOUNTER — Ambulatory Visit (HOSPITAL_COMMUNITY)
Admission: RE | Admit: 2019-02-14 | Discharge: 2019-02-14 | Disposition: A | Discharge status: Home / Self Care | Payer: Medicare Other | Source: Ambulatory Visit | Attending: Nephrology | Admitting: Nephrology

## 2019-02-14 ENCOUNTER — Other Ambulatory Visit (HOSPITAL_COMMUNITY): Payer: Self-pay | Admitting: Nephrology

## 2019-02-14 ENCOUNTER — Other Ambulatory Visit: Payer: Self-pay

## 2019-02-14 DIAGNOSIS — N179 Acute kidney failure, unspecified: Secondary | ICD-10-CM | POA: Insufficient documentation

## 2019-02-22 ENCOUNTER — Other Ambulatory Visit: Payer: Self-pay

## 2019-02-22 ENCOUNTER — Other Ambulatory Visit: Payer: Medicare Other

## 2019-02-22 DIAGNOSIS — Z8546 Personal history of malignant neoplasm of prostate: Secondary | ICD-10-CM | POA: Diagnosis not present

## 2019-03-01 ENCOUNTER — Other Ambulatory Visit: Payer: Self-pay | Admitting: *Deleted

## 2019-03-01 ENCOUNTER — Telehealth: Payer: Self-pay | Admitting: Family Medicine

## 2019-03-01 ENCOUNTER — Other Ambulatory Visit: Payer: Self-pay

## 2019-03-01 ENCOUNTER — Ambulatory Visit (INDEPENDENT_AMBULATORY_CARE_PROVIDER_SITE_OTHER): Payer: Medicare Other | Admitting: Urology

## 2019-03-01 DIAGNOSIS — N3941 Urge incontinence: Secondary | ICD-10-CM | POA: Diagnosis not present

## 2019-03-01 DIAGNOSIS — R3121 Asymptomatic microscopic hematuria: Secondary | ICD-10-CM | POA: Diagnosis not present

## 2019-03-01 DIAGNOSIS — R351 Nocturia: Secondary | ICD-10-CM

## 2019-03-01 DIAGNOSIS — Z8546 Personal history of malignant neoplasm of prostate: Secondary | ICD-10-CM | POA: Diagnosis not present

## 2019-03-01 MED ORDER — METFORMIN HCL 500 MG PO TABS: 250.0000 mg | ORAL_TABLET | Freq: Every day | ORAL | 0 refills | Status: DC

## 2019-03-01 MED ORDER — RIVAROXABAN 20 MG PO TABS: ORAL_TABLET | ORAL | 0 refills | Status: DC

## 2019-03-01 NOTE — Telephone Encounter (Signed)
What is the name of the medication?  Ricardo Mayer (XARELTO) 20 MG TABS tablet metformin  Have you contacted your pharmacy to request a refill? [facility] cannot fill because under [name] name please call in new rx under [name]  Which pharmacy would you like this sent to? [facility]   Patient notified that their request is being sent to the clinical staff for review and that they should receive a call once it is complete. If they do not receive a call within 24 hours they can check with their pharmacy or our office.

## 2019-03-01 NOTE — Telephone Encounter (Signed)
One month refill sent to pharmacy.  No answer at patient's phone number.

## 2019-03-01 NOTE — Telephone Encounter (Signed)
Patient has a follow up appointment scheduled for July 31 with new provider, Ms [name].

## 2019-03-06 ENCOUNTER — Telehealth: Payer: Self-pay | Admitting: Family Medicine

## 2019-03-06 NOTE — Telephone Encounter (Signed)
LM to call back with full name on drivers license and lis #.  -jhb

## 2019-03-06 NOTE — Telephone Encounter (Signed)
Form written and on rakes desk to sign

## 2019-03-07 DIAGNOSIS — H903 Sensorineural hearing loss, bilateral: Secondary | ICD-10-CM | POA: Diagnosis not present

## 2019-03-12 ENCOUNTER — Ambulatory Visit: Payer: Medicare Other | Admitting: Family Medicine

## 2019-03-15 ENCOUNTER — Encounter: Payer: Self-pay | Admitting: Family Medicine

## 2019-03-15 ENCOUNTER — Ambulatory Visit (INDEPENDENT_AMBULATORY_CARE_PROVIDER_SITE_OTHER): Payer: Medicare Other | Admitting: Family Medicine

## 2019-03-15 ENCOUNTER — Other Ambulatory Visit: Payer: Self-pay

## 2019-03-15 VITALS — BP 136/76 | HR 77 | Temp 97.4°F | Ht 67.0 in | Wt 228.0 lb

## 2019-03-15 DIAGNOSIS — E1151 Type 2 diabetes mellitus with diabetic peripheral angiopathy without gangrene: Secondary | ICD-10-CM

## 2019-03-15 DIAGNOSIS — E785 Hyperlipidemia, unspecified: Secondary | ICD-10-CM

## 2019-03-15 DIAGNOSIS — E1169 Type 2 diabetes mellitus with other specified complication: Secondary | ICD-10-CM

## 2019-03-15 DIAGNOSIS — N184 Chronic kidney disease, stage 4 (severe): Secondary | ICD-10-CM

## 2019-03-15 DIAGNOSIS — I70209 Unspecified atherosclerosis of native arteries of extremities, unspecified extremity: Secondary | ICD-10-CM

## 2019-03-15 DIAGNOSIS — E1159 Type 2 diabetes mellitus with other circulatory complications: Secondary | ICD-10-CM | POA: Diagnosis not present

## 2019-03-15 DIAGNOSIS — E875 Hyperkalemia: Secondary | ICD-10-CM

## 2019-03-15 DIAGNOSIS — I10 Essential (primary) hypertension: Secondary | ICD-10-CM | POA: Diagnosis not present

## 2019-03-15 LAB — BAYER DCA HB A1C WAIVED: HB A1C (BAYER DCA - WAIVED): 6 % (ref <7.0)

## 2019-03-15 NOTE — Patient Instructions (Signed)
It was a pleasure seeing you today, Ricardo Mayer.  Information regarding what we discussed is included in this packet.  Please make an appointment to see me in 4 months.   In a few days you may receive a survey in the mail or online from [employer] regarding your visit with [name] today. Please take a moment to fill this out. Your feedback is very important to our office. It can help [name] better understand your needs as well as improve your experience and satisfaction. Thank you for taking your time to complete it. We care about you.  Because of recent events of COVID-19 ("Coronavirus"), please follow CDC recommendations:   1. Wash your hand frequently 2. Avoid touching your face 3. Stay away from people who are sick 4. If you have symptoms such as fever, cough, shortness of breath then call your healthcare provider for further guidance 5. If you are sick, STAY AT HOME, unless otherwise directed by your healthcare provider. 6. Follow directions from state and national officials regarding staying safe    Please feel free to call our office if any questions or concerns arise.  Warm Regards, Monia Pouch, FNP-C [facility] [address] [place],  [address] [phone]

## 2019-03-15 NOTE — Progress Notes (Signed)
Subjective:  Patient ID: [name]., male    DOB: 27-Oct-1940, 78 y.o.   MRN: [id number]  Patient Care Team: Baruch Gouty, FNP as PCP - General (Family Medicine) Kathrynn Ducking, MD as Consulting Physician (Neurology) Jettie Booze, MD as Consulting Physician (Cardiology) Warren Danes, PA-C as Physician Assistant (Dermatology) Elmarie Shiley, MD as Consulting Physician (Nephrology) Irine Seal, MD as Attending Physician (Urology)   Chief Complaint:  Medical Management of Chronic Issues (3-4 mo ), Hyperlipidemia, Diabetes, and Hypertension   HPI: Humphrey Guerreiro. is a 78 y.o. male presenting on 03/15/2019 for Medical Management of Chronic Issues (3-4 mo ), Hyperlipidemia, Diabetes, and Hypertension   1. Type 2 diabetes mellitus with vascular disease ([place])  Diabetes mellitus 2 Compliant with meds - Yes Checking CBGs? No Exercising regularly? - No Watching carbohydrate intake? - Yes Neuropathy ? - No Hypoglycemic events - No Polyuria - No Polydipsia - No Vision problems - No    2. Hyperlipidemia associated with type 2 diabetes mellitus ([place])  Does try to watch diet. Does not exercise on a regular basis. Compliant with statin therapy without associated side effects.    3. Chronic kidney disease (CKD), stage IV (severe) ([place])  Followed by nephrology and urology. No decrease in urine output. No increased swelling.    4. Hypertension associated with type 2 diabetes mellitus ([place])  Complaint with meds - Yes Checking BP at home ranging 130/70, 115/60 Exercising Regularly - No Watching Salt intake - Yes Pertinent ROS:  Headache - No Chest pain - No Dyspnea - No Palpitations - No LE edema - Yes. Minimal They report good compliance with medications and can restate their regimen by memory. No medication side effects.  BP Readings from Last 3 Encounters:  03/15/19 136/76  12/17/18 (!) 151/93  10/24/18 138/72        Relevant past medical, surgical,  family, and social history reviewed and updated as indicated.  Allergies and medications reviewed and updated. Date reviewed: Chart in Epic.   Past Medical History:  Diagnosis Date  . Adenomatous polyp of colon   . Arthritis    bilateral knees  . CKD (chronic kidney disease), stage II    Dr. Patel,nephrology "stable last 3 yrs"  . 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 Diabetes mellitus without complication ([place])    Dr. Laurance Flatten.PCP  . History of basal cell cancer   . History of DVT of lower extremity    2011--  LEFT LEG  . History of kidney stones   . History of melanoma excision    RIGHT ARM  . Hyperlipidemia   . Hypertension   . Impaired hearing    hearing aids bilateral  . Melanoma ([place]) 1990   arm and back -Melanoma, basal cell others- Dr. Edgardo Roys follows.  . Near syncope 08/25/2015   Dr. Roland Rack once"found blood clots in lung"  . Nocturia   . Prostate cancer [facility]) 2014   Dr. Cherylann Parr- "seed implant" '14  . Ulcer of left lower leg ([place])    recent pcp visit dr Laurance Flatten--  ordered betadine wet/dry dsg daily (03-18-2013)  [place] IN 1 WEEK    Past Surgical History:  Procedure Laterality Date  . COLONOSCOPY    . KIDNEY STONE EXTRACTIONS  LAST ONE 1970   X2 OPEN/  X1  URETEROSCOPY  . MELANOMA EXCISION RIGHT ARM  1990  . POLYPECTOMY    .  PROSTATE BIOPSY    . RADIOACTIVE SEED IMPLANT N/A 03/21/2013   Procedure: RADIOACTIVE SEED IMPLANT;  Surgeon: Malka So, MD;  [facility];  Service: Urology;  Laterality: N/A;  87 seeds implanted    . TONSILLECTOMY    . TOTAL KNEE ARTHROPLASTY Right 03/21/2016   Procedure: RIGHT TOTAL KNEE ARTHROPLASTY;  Surgeon: Paralee Cancel, MD;  Location: [facility];  Service: Orthopedics;  Laterality: Right;    Social History   Socioeconomic History  . Marital status: Married    Spouse name: Not on file  . Number of children: 3  . Years of education: master's  . Highest  education level: Not on file  Occupational History  . Occupation: Retail buyer  Social Needs  . Financial resource strain: Not hard at all  . Food insecurity    Worry: Never true    Inability: Never true  . Transportation needs    Medical: No    Non-medical: No  Tobacco Use  . Smoking status: Never Smoker  . Smokeless tobacco: Never Used  Substance and Sexual Activity  . Alcohol use: No  . Drug use: No  . Sexual activity: Not on file  Lifestyle  . Physical activity    Days per week: 0 days    Minutes per session: 0 min  . Stress: Not on file  Relationships  . Social Herbalist on phone: Three times a week    Gets together: More than three times a week    Attends religious service: More than 4 times per year    Active member of club or organization: Yes    Attends meetings of clubs or organizations: More than 4 times per year    Relationship status: Married  . Intimate partner violence    Fear of current or ex partner: No    Emotionally abused: No    Physically abused: No    Forced sexual activity: No  Other Topics Concern  . Not on file  Social History Narrative   Patient drinks 1 cup of coffee daily.   Patient is right handed.     Outpatient Encounter Medications as of 03/15/2019  Medication Sig  . amLODipine (NORVASC) 5 MG tablet Take 1 tablet (5 mg total) by mouth daily.  Marland Kitchen atorvastatin (LIPITOR) 10 MG tablet Take 1 tablet (10 mg total) by mouth daily.  . blood glucose meter kit and supplies KIT Dispense based on patient and insurance preference. Use up to four times daily as directed. (FOR ICD-9 250.00, 250.01).  . cholecalciferol (VITAMIN D3) 25 MCG (1000 UT) tablet Take 1,000 Units by mouth daily.  . furosemide (LASIX) 20 MG tablet Take 1.5 tablets (30 mg total) by mouth daily as needed.  Marland Kitchen glucose blood (ONE TOUCH ULTRA TEST) test strip Test up to tid. DX E11.9  . metFORMIN (GLUCOPHAGE) 500 MG tablet Take 0.5 tablets (250 mg total) by mouth  daily with breakfast.  . olmesartan (BENICAR) 40 MG tablet Take 1 tablet by mouth once daily  . rivaroxaban (XARELTO) 20 MG TABS tablet Take 1 tablet by mouth once daily with food  . [DISCONTINUED] fluticasone (FLONASE) 50 MCG/ACT nasal spray Place 2 sprays into both nostrils daily.   No facility-administered encounter medications on file as of 03/15/2019.     No Known Allergies  Review of Systems  Constitutional: Negative for activity change, appetite change, chills, diaphoresis, fatigue, fever and unexpected weight change.  Eyes: Negative for photophobia and visual disturbance.  Respiratory: Negative for cough, chest tightness and shortness of breath.   Cardiovascular: Positive for leg swelling. Negative for chest pain and palpitations.  Gastrointestinal: Negative for abdominal pain, anal bleeding, blood in stool, constipation, diarrhea, nausea, rectal pain and vomiting.  Endocrine: Negative for polydipsia, polyphagia and polyuria.  Genitourinary: Negative for decreased urine volume and difficulty urinating.  Musculoskeletal: Negative for arthralgias, gait problem and myalgias.  Skin: Negative for color change.  Neurological: Negative for dizziness, tremors, seizures, syncope, facial asymmetry, speech difficulty, weakness, light-headedness, numbness and headaches.  Psychiatric/Behavioral: Negative for confusion.  All other systems reviewed and are negative.       Objective:  BP 136/76   Pulse 77   Temp (!) 97.4 F (36.3 C) (Oral)   Ht '5\' 7"'  (1.702 m)   Wt 228 lb (103.4 kg)   BMI 35.71 kg/m    Wt Readings from Last 3 Encounters:  03/15/19 228 lb (103.4 kg)  12/17/18 242 lb (109.8 kg)  10/31/18 242 lb (109.8 kg)    Physical Exam Vitals signs and nursing note reviewed.  Constitutional:      General: He is not in acute distress.    Appearance: Normal appearance. He is well-developed and well-groomed. He is obese. He is not ill-appearing, toxic-appearing or diaphoretic.   HENT:     Head: Normocephalic and atraumatic.     Jaw: There is normal jaw occlusion.     Right Ear: Hearing normal.     Left Ear: Hearing normal.     Nose: Nose normal.     Mouth/Throat:     Lips: Pink.     Mouth: Mucous membranes are moist.     Pharynx: Oropharynx is clear. Uvula midline.  Eyes:     General: Lids are normal.     Extraocular Movements: Extraocular movements intact.     Conjunctiva/sclera: Conjunctivae normal.     Pupils: Pupils are equal, round, and reactive to light.  Neck:     Musculoskeletal: Normal range of motion and neck supple.     Thyroid: No thyroid mass, thyromegaly or thyroid tenderness.     Vascular: No carotid bruit or JVD.     Trachea: Trachea and phonation normal.  Cardiovascular:     Rate and Rhythm: Normal rate and regular rhythm.     Chest Wall: PMI is not displaced.     Pulses: Normal pulses.     Heart sounds: Normal heart sounds. No murmur. No friction rub. No gallop.   Pulmonary:     Effort: Pulmonary effort is normal. No respiratory distress.     Breath sounds: Normal breath sounds. No wheezing.  Abdominal:     General: Abdomen is protuberant. Bowel sounds are normal. There is no distension or abdominal bruit.     Palpations: Abdomen is soft. There is no hepatomegaly or splenomegaly.     Tenderness: There is no abdominal tenderness. There is no right CVA tenderness or left CVA tenderness.     Hernia: No hernia is present.  Musculoskeletal: Normal range of motion.     Right lower leg: No edema.     Left lower leg: No edema.  Lymphadenopathy:     Cervical: No cervical adenopathy.  Skin:    General: Skin is warm and dry.     Capillary Refill: Capillary refill takes less than 2 seconds.     Coloration: Skin is not cyanotic, jaundiced or pale.     Findings: No rash.  Neurological:     General: No focal  deficit present.     Mental Status: He is alert and oriented to person, place, and time.     Cranial Nerves: Cranial nerves are intact.      Sensory: Sensation is intact.     Motor: Motor function is intact.     Coordination: Coordination is intact.     Gait: Gait is intact.     Deep Tendon Reflexes: Reflexes are normal and symmetric.  Psychiatric:        Attention and Perception: Attention and perception normal.        Mood and Affect: Mood and affect normal.        Speech: Speech normal.        Behavior: Behavior normal. Behavior is cooperative.        Thought Content: Thought content normal.        Cognition and Memory: Cognition and memory normal.        Judgment: Judgment normal.     Results for orders placed or performed in visit on 12/13/18  [facility]  Result Value Ref Range   Glucose 106 (H) 65 - 99 mg/dL   BUN 34 (H) 8 - 27 mg/dL   Creatinine, Ser 2.17 (H) 0.76 - 1.27 mg/dL   GFR calc non Af Amer 28 (L) >59 mL/min/1.73   GFR calc Af Amer 33 (L) >59 mL/min/1.73   BUN/Creatinine Ratio 16 10 - 24   Sodium 140 134 - 144 mmol/L   Potassium 5.2 3.5 - 5.2 mmol/L   Chloride 106 96 - 106 mmol/L   CO2 24 20 - 29 mmol/L   Calcium 9.6 8.6 - 10.2 mg/dL     labs from Dr. Posey Pronto reviewed and scanned into chart.   Pertinent labs & imaging results that were available during my care of the patient were reviewed by me and considered in my medical decision making.  Assessment & Plan:  Jamarie was seen today for medical management of chronic issues, hyperlipidemia, diabetes and hypertension.  Diagnoses and all orders for this visit:  Type 2 diabetes mellitus with vascular disease ([place]) Diet and exercise encouraged. A1C 6.0 today. No change in therapy. Last potassium was slightly elevated at 5.5, will recheck today.  -     CMP14+EGFR -     Bayer DCA Hb A1c Waived  Hyperlipidemia associated with type 2 diabetes mellitus ([place]) Diet and exercise encouraged. Continue medications. Repeat lipids in 6 months.   Chronic kidney disease (CKD), stage IV (severe) ([place]) Followed by nephrology. Has upcoming appointment. Labs  pending.  -     CMP14+EGFR  Hypertension associated with type 2 diabetes mellitus ([place]) Well controlled. Diet and exercise encouraged. Continue medications as prescribed.  -     CMP14+EGFR     Continue all other maintenance medications.  Follow up plan: Return in about 4 months (around 07/15/2019), or if symptoms worsen or fail to improve, for DM.  Educational handout given for survey and COVID-19  The above assessment and management plan was discussed with the patient. The patient verbalized understanding of and has agreed to the management plan. Patient is aware to call the clinic if symptoms persist or worsen. Patient is aware when to return to the clinic for a follow-up visit. Patient educated on when it is appropriate to go to the emergency department.   Monia Pouch, FNP-C [facility] [phone] 03/15/19

## 2019-03-16 LAB — CMP14+EGFR
ALT: 12 IU/L (ref 0–44)
AST: 14 IU/L (ref 0–40)
Albumin/Globulin Ratio: 2.2 (ref 1.2–2.2)
Albumin: 4.2 g/dL (ref 3.7–4.7)
Alkaline Phosphatase: 65 IU/L (ref 39–117)
BUN/Creatinine Ratio: 16 (ref 10–24)
BUN: 38 mg/dL — ABNORMAL HIGH (ref 8–27)
Bilirubin Total: 0.5 mg/dL (ref 0.0–1.2)
CO2: 19 mmol/L — ABNORMAL LOW (ref 20–29)
Calcium: 9.5 mg/dL (ref 8.6–10.2)
Chloride: 104 mmol/L (ref 96–106)
Creatinine, Ser: 2.4 mg/dL — ABNORMAL HIGH (ref 0.76–1.27)
GFR calc Af Amer: 29 mL/min/{1.73_m2} — ABNORMAL LOW (ref >59)
GFR calc non Af Amer: 25 mL/min/{1.73_m2} — ABNORMAL LOW (ref >59)
Globulin, Total: 1.9 g/dL (ref 1.5–4.5)
Glucose: 105 mg/dL — ABNORMAL HIGH (ref 65–99)
Potassium: 5.7 mmol/L — ABNORMAL HIGH (ref 3.5–5.2)
Sodium: 139 mmol/L (ref 134–144)
Total Protein: 6.1 g/dL (ref 6.0–8.5)

## 2019-03-17 DIAGNOSIS — E875 Hyperkalemia: Secondary | ICD-10-CM | POA: Insufficient documentation

## 2019-03-17 MED ORDER — SODIUM POLYSTYRENE SULFONATE 15 GM/60ML PO SUSP: 30.0000 g | Freq: Once | ORAL | 0 refills | Status: AC

## 2019-03-17 NOTE — Addendum Note (Signed)
Addended by: Baruch Gouty on: 03/17/2019 09:44 AM   Modules accepted: Orders

## 2019-03-18 ENCOUNTER — Other Ambulatory Visit: Payer: Medicare Other

## 2019-03-18 ENCOUNTER — Other Ambulatory Visit: Payer: Self-pay

## 2019-03-18 ENCOUNTER — Other Ambulatory Visit: Payer: Self-pay | Admitting: Family Medicine

## 2019-03-18 DIAGNOSIS — N184 Chronic kidney disease, stage 4 (severe): Secondary | ICD-10-CM

## 2019-03-18 DIAGNOSIS — E875 Hyperkalemia: Secondary | ICD-10-CM | POA: Diagnosis not present

## 2019-03-19 LAB — BMP8+EGFR
BUN/Creatinine Ratio: 13 (ref 10–24)
BUN: 32 mg/dL — ABNORMAL HIGH (ref 8–27)
CO2: 17 mmol/L — ABNORMAL LOW (ref 20–29)
Calcium: 8.8 mg/dL (ref 8.6–10.2)
Chloride: 107 mmol/L — ABNORMAL HIGH (ref 96–106)
Creatinine, Ser: 2.5 mg/dL — ABNORMAL HIGH (ref 0.76–1.27)
GFR calc Af Amer: 27 mL/min/{1.73_m2} — ABNORMAL LOW (ref >59)
GFR calc non Af Amer: 24 mL/min/{1.73_m2} — ABNORMAL LOW (ref >59)
Glucose: 167 mg/dL — ABNORMAL HIGH (ref 65–99)
Potassium: 4.7 mmol/L (ref 3.5–5.2)
Sodium: 137 mmol/L (ref 134–144)

## 2019-03-27 ENCOUNTER — Other Ambulatory Visit: Payer: Self-pay | Admitting: Family Medicine

## 2019-04-01 ENCOUNTER — Other Ambulatory Visit: Payer: Medicare Other

## 2019-04-01 DIAGNOSIS — N183 Chronic kidney disease, stage 3 (moderate): Secondary | ICD-10-CM | POA: Diagnosis not present

## 2019-04-01 DIAGNOSIS — N390 Urinary tract infection, site not specified: Secondary | ICD-10-CM | POA: Diagnosis not present

## 2019-04-09 DIAGNOSIS — N183 Chronic kidney disease, stage 3 (moderate): Secondary | ICD-10-CM | POA: Diagnosis not present

## 2019-04-09 DIAGNOSIS — N2581 Secondary hyperparathyroidism of renal origin: Secondary | ICD-10-CM | POA: Diagnosis not present

## 2019-04-09 DIAGNOSIS — I129 Hypertensive chronic kidney disease with stage 1 through stage 4 chronic kidney disease, or unspecified chronic kidney disease: Secondary | ICD-10-CM | POA: Diagnosis not present

## 2019-04-09 DIAGNOSIS — D631 Anemia in chronic kidney disease: Secondary | ICD-10-CM | POA: Diagnosis not present

## 2019-04-12 ENCOUNTER — Other Ambulatory Visit: Payer: Self-pay | Admitting: Family Medicine

## 2019-04-12 DIAGNOSIS — I10 Essential (primary) hypertension: Secondary | ICD-10-CM

## 2019-05-15 ENCOUNTER — Telehealth: Payer: Self-pay | Admitting: Family Medicine

## 2019-05-16 ENCOUNTER — Ambulatory Visit (INDEPENDENT_AMBULATORY_CARE_PROVIDER_SITE_OTHER): Payer: Medicare Other | Admitting: *Deleted

## 2019-05-16 ENCOUNTER — Other Ambulatory Visit: Payer: Self-pay

## 2019-05-16 DIAGNOSIS — Z23 Encounter for immunization: Secondary | ICD-10-CM

## 2019-05-16 DIAGNOSIS — N183 Chronic kidney disease, stage 3 unspecified: Secondary | ICD-10-CM | POA: Diagnosis not present

## 2019-06-06 ENCOUNTER — Telehealth: Payer: Self-pay | Admitting: Family Medicine

## 2019-06-06 DIAGNOSIS — I10 Essential (primary) hypertension: Secondary | ICD-10-CM

## 2019-06-06 MED ORDER — AMLODIPINE BESYLATE 5 MG PO TABS: 5.0000 mg | ORAL_TABLET | Freq: Every day | ORAL | 3 refills | Status: DC

## 2019-06-06 MED ORDER — METFORMIN HCL 500 MG PO TABS: 250.0000 mg | ORAL_TABLET | Freq: Every day | ORAL | 0 refills | Status: DC

## 2019-06-06 MED ORDER — ATORVASTATIN CALCIUM 10 MG PO TABS: 10.0000 mg | ORAL_TABLET | Freq: Every day | ORAL | 1 refills | Status: DC

## 2019-06-06 MED ORDER — RIVAROXABAN 20 MG PO TABS: ORAL_TABLET | ORAL | 0 refills | Status: DC

## 2019-06-06 MED ORDER — FUROSEMIDE 20 MG PO TABS: 30.0000 mg | ORAL_TABLET | Freq: Every day | ORAL | 1 refills | Status: DC | PRN

## 2019-06-06 MED ORDER — OLMESARTAN MEDOXOMIL 40 MG PO TABS: 40.0000 mg | ORAL_TABLET | Freq: Every day | ORAL | 1 refills | Status: DC

## 2019-06-06 NOTE — Telephone Encounter (Signed)
meds sent to [facility]

## 2019-06-21 ENCOUNTER — Other Ambulatory Visit: Payer: Self-pay

## 2019-06-21 ENCOUNTER — Other Ambulatory Visit: Payer: Medicare Other

## 2019-06-21 DIAGNOSIS — N1831 Chronic kidney disease, stage 3a: Secondary | ICD-10-CM | POA: Diagnosis not present

## 2019-06-26 ENCOUNTER — Ambulatory Visit: Payer: Medicare Other | Admitting: Family Medicine

## 2019-06-26 DIAGNOSIS — N184 Chronic kidney disease, stage 4 (severe): Secondary | ICD-10-CM | POA: Diagnosis not present

## 2019-06-26 DIAGNOSIS — D631 Anemia in chronic kidney disease: Secondary | ICD-10-CM | POA: Diagnosis not present

## 2019-06-26 DIAGNOSIS — N2581 Secondary hyperparathyroidism of renal origin: Secondary | ICD-10-CM | POA: Diagnosis not present

## 2019-06-26 DIAGNOSIS — I129 Hypertensive chronic kidney disease with stage 1 through stage 4 chronic kidney disease, or unspecified chronic kidney disease: Secondary | ICD-10-CM | POA: Diagnosis not present

## 2019-07-02 ENCOUNTER — Other Ambulatory Visit: Payer: Self-pay

## 2019-07-03 ENCOUNTER — Ambulatory Visit (INDEPENDENT_AMBULATORY_CARE_PROVIDER_SITE_OTHER): Payer: Medicare Other | Admitting: Family Medicine

## 2019-07-03 ENCOUNTER — Encounter: Payer: Self-pay | Admitting: Family Medicine

## 2019-07-03 VITALS — BP 123/75 | HR 97 | Temp 98.7°F | Resp 20 | Ht 67.0 in | Wt 229.0 lb

## 2019-07-03 DIAGNOSIS — N184 Chronic kidney disease, stage 4 (severe): Secondary | ICD-10-CM

## 2019-07-03 DIAGNOSIS — N189 Chronic kidney disease, unspecified: Secondary | ICD-10-CM | POA: Diagnosis not present

## 2019-07-03 DIAGNOSIS — I10 Essential (primary) hypertension: Secondary | ICD-10-CM

## 2019-07-03 DIAGNOSIS — E1169 Type 2 diabetes mellitus with other specified complication: Secondary | ICD-10-CM | POA: Diagnosis not present

## 2019-07-03 DIAGNOSIS — E1159 Type 2 diabetes mellitus with other circulatory complications: Secondary | ICD-10-CM

## 2019-07-03 DIAGNOSIS — E559 Vitamin D deficiency, unspecified: Secondary | ICD-10-CM | POA: Diagnosis not present

## 2019-07-03 DIAGNOSIS — D631 Anemia in chronic kidney disease: Secondary | ICD-10-CM | POA: Diagnosis not present

## 2019-07-03 DIAGNOSIS — E785 Hyperlipidemia, unspecified: Secondary | ICD-10-CM | POA: Diagnosis not present

## 2019-07-03 DIAGNOSIS — I152 Hypertension secondary to endocrine disorders: Secondary | ICD-10-CM

## 2019-07-03 DIAGNOSIS — J301 Allergic rhinitis due to pollen: Secondary | ICD-10-CM

## 2019-07-03 LAB — BAYER DCA HB A1C WAIVED: HB A1C (BAYER DCA - WAIVED): 5.8 % (ref <7.0)

## 2019-07-03 MED ORDER — FLUTICASONE PROPIONATE 50 MCG/ACT NA SUSP: 2.0000 | Freq: Every day | NASAL | 6 refills | Status: DC

## 2019-07-03 NOTE — Patient Instructions (Addendum)
It was a pleasure seeing you today, Ricardo Mayer.  Information regarding what we discussed is included in this packet.  Please make an appointment to see me in 3-4 months.    If you had labs performed today, you will be contacted with the abnormal results once they are available, usually in the next 3 business days for routine lab work.  If you had STI testing, a pap smear, or a biopsy performed, expect to be contacted in about 7-10 days. If results are normal, you will not be notified.    In a few days you may receive a survey in the mail or online from [employer] regarding your visit with [name] today. Please take a moment to fill this out. Your feedback is very important to our office. It can help [name] better understand your needs as well as improve your experience and satisfaction. Thank you for taking your time to complete it. We care about you.   Because of recent events of COVID-19 ("Coronavirus"), please follow CDC recommendations:   1. Wash your hand frequently 2. Avoid touching your face 3. Stay away from people who are sick 4. If you have symptoms such as fever, cough, shortness of breath then call your healthcare provider for further guidance 5. If you are sick, STAY AT HOME, unless otherwise directed by your healthcare provider. 6. Follow directions from state and national officials regarding staying safe    Please feel free to call our office if any questions or concerns arise.  Warm Regards, Monia Pouch, FNP-C [facility] [address] [place], [place] [address] [phone]

## 2019-07-03 NOTE — Progress Notes (Signed)
Subjective:  Patient ID: Ricardo Mayer., male    DOB: 1940-09-09, 78 y.o.   MRN: [id number]  Patient Care Team: Baruch Gouty, FNP as PCP - General (Family Medicine) Kathrynn Ducking, MD as Consulting Physician (Neurology) Jettie Booze, MD as Consulting Physician (Cardiology) Warren Danes, PA-C as Physician Assistant (Dermatology) Elmarie Shiley, MD as Consulting Physician (Nephrology) Irine Seal, MD as Attending Physician (Urology)   Chief Complaint:  Medical Management of Chronic Issues (3 mo ), Diabetes, Hyperlipidemia, and Hypertension   HPI: Ricardo Mayer. is a 78 y.o. male presenting on 07/03/2019 for Medical Management of Chronic Issues (3 mo ), Diabetes, Hyperlipidemia, and Hypertension   1. Type 2 diabetes mellitus with vascular disease ([place]) Pt presents for follow up evaluation of Type 2 diabetes mellitus.  Current symptoms include none. Patient denies foot ulcerations, hyperglycemia, hypoglycemia , increased appetite, nausea, paresthesia of the feet, polydipsia, polyuria, visual disturbances, vomiting and weight loss.  Current diabetic medications include metformin Compliant with meds - Yes  Current monitoring regimen: home blood tests - several times daily Home blood sugar records: trend: stable Any episodes of hypoglycemia? no  Known diabetic complications: nephropathy, cardiovascular disease and peripheral vascular disease Cardiovascular risk factors: advanced age (older than 9 for men, 48 for women), diabetes mellitus, dyslipidemia, hypertension, male gender, obesity (BMI >= 30 kg/m2) and sedentary lifestyle Eye exam current (within one year): no Podiatry yearly?  No Weight trend: stable Current diet: well balanced Current exercise: none  PNA Vaccine UTD?  Yes Hep B Vaccine?  Yes Tdap Vaccine UTD?  Yes Urine microalbumin UTD? Yes  Is He on ACE inhibitor or angiotensin II receptor blocker?  Yes, olmesartan Is He on statin? Yes  atorvastatin Is He on ASA 81 mg daily?  No    2. Chronic kidney disease (CKD), stage IV (severe) ([place]) Followed by nephrology, Dr. Posey Pronto, who he saw in August.   3. Hyperlipidemia associated with type 2 diabetes mellitus ([place]) Compliant with medications - Yes Current medications - atorvastatin Side effects from medications - No Diet - balanced Exercise - no scheduled exercise  Lab Results  Component Value Date   CHOL 110 10/24/2018   HDL 39 (L) 10/24/2018   LDLCALC 46 10/24/2018   LDLDIRECT 21 03/05/2013   TRIG 124 10/24/2018   CHOLHDL 2.8 10/24/2018     Family and personal medical history reviewed. Smoking and ETOH history reviewed.    4. Hypertension associated with type 2 diabetes mellitus ([place]) Complaint with meds - Yes Current Medications - amlodipine, olmesartan Checking BP at home ranging yes, 130/75 Exercising Regularly - No Watching Salt intake - Yes Pertinent ROS:  Headache - No Fatigue - No Visual Disturbances - No Chest pain - No Dyspnea - No Palpitations - No LE edema - No They report good compliance with medications and can restate their regimen by memory. No medication side effects.  Family, social, and smoking history reviewed.   BP Readings from Last 3 Encounters:  07/03/19 123/75  03/15/19 136/76  12/17/18 (!) 151/93   CMP Latest Ref Rng & Units 03/18/2019 03/15/2019 12/13/2018  Glucose 65 - 99 mg/dL 167(H) 105(H) 106(H)  BUN 8 - 27 mg/dL 32(H) 38(H) 34(H)  Creatinine 0.76 - 1.27 mg/dL 2.50(H) 2.40(H) 2.17(H)  Sodium 134 - 144 mmol/L 137 139 140  Potassium 3.5 - 5.2 mmol/L 4.7 5.7(H) 5.2  Chloride 96 - 106 mmol/L 107(H) 104 106  CO2 20 - 29 mmol/L 17(L) 19(L)  24  Calcium 8.6 - 10.2 mg/dL 8.8 9.5 9.6  Total Protein 6.0 - 8.5 g/dL - 6.1 -  Total Bilirubin 0.0 - 1.2 mg/dL - 0.5 -  Alkaline Phos 39 - 117 IU/L - 65 -  AST 0 - 40 IU/L - 14 -  ALT 0 - 44 IU/L - 12 -      5. Vitamin D deficiency Pt is taking oral repletion therapy. Denies  bone pain and tenderness, muscle weakness, fracture, and difficulty walking. Lab Results  Component Value Date   VD25OH 15.9 (L) 10/24/2018   VD25OH 24.0 (L) 06/07/2018   VD25OH 26.9 (L) 02/01/2018   Lab Results  Component Value Date   CALCIUM 8.8 03/18/2019   PHOS 3.3 10/07/2015         Relevant past medical, surgical, family, and social history reviewed and updated as indicated.  Allergies and medications reviewed and updated. Date reviewed: Chart in Epic.   Past Medical History:  Diagnosis Date  . Adenomatous polyp of colon   . Arthritis    bilateral knees  . CKD (chronic kidney disease), stage II    Dr. Patel,nephrology "stable last 3 yrs"  . 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 Diabetes mellitus without complication ([place])    Dr. Laurance Flatten.PCP  . History of basal cell cancer   . History of DVT of lower extremity    2011--  LEFT LEG  . History of kidney stones   . History of melanoma excision    RIGHT ARM  . Hyperlipidemia   . Hypertension   . Impaired hearing    hearing aids bilateral  . Melanoma ([place]) 1990   arm and back -Melanoma, basal cell others- Dr. Edgardo Roys follows.  . Near syncope 08/25/2015   Dr. Roland Rack once"found blood clots in lung"  . Nocturia   . Prostate cancer [facility]) 2014   Dr. Cherylann Parr- "seed implant" '14  . Ulcer of left lower leg ([place])    recent pcp visit dr Laurance Flatten--  ordered betadine wet/dry dsg daily (03-18-2013)  [place] IN 1 WEEK    Past Surgical History:  Procedure Laterality Date  . COLONOSCOPY    . KIDNEY STONE EXTRACTIONS  LAST ONE 1970   X2 OPEN/  X1  URETEROSCOPY  . MELANOMA EXCISION RIGHT ARM  1990  . POLYPECTOMY    . PROSTATE BIOPSY    . RADIOACTIVE SEED IMPLANT N/A 03/21/2013   Procedure: RADIOACTIVE SEED IMPLANT;  Surgeon: Malka So, MD;  Location: [facility];  Service: Urology;  Laterality: N/A;  87 seeds implanted    . TONSILLECTOMY    . TOTAL KNEE  ARTHROPLASTY Right 03/21/2016   Procedure: RIGHT TOTAL KNEE ARTHROPLASTY;  Surgeon: Paralee Cancel, MD;  Location: [facility];  Service: Orthopedics;  Laterality: Right;    Social History   Socioeconomic History  . Marital status: Married    Spouse name: Not on file  . Number of children: 3  . Years of education: master's  . Highest education level: Not on file  Occupational History  . Occupation: Retail buyer  Social Needs  . Financial resource strain: Not hard at all  . Food insecurity    Worry: Never true    Inability: Never true  . Transportation needs    Medical: No    Non-medical: No  Tobacco Use  . Smoking status: Never Smoker  . Smokeless tobacco: Never Used  Substance and Sexual Activity  .  Alcohol use: No  . Drug use: No  . Sexual activity: Not on file  Lifestyle  . Physical activity    Days per week: 0 days    Minutes per session: 0 min  . Stress: Not on file  Relationships  . Social Herbalist on phone: Three times a week    Gets together: More than three times a week    Attends religious service: More than 4 times per year    Active member of club or organization: Yes    Attends meetings of clubs or organizations: More than 4 times per year    Relationship status: Married  . Intimate partner violence    Fear of current or ex partner: No    Emotionally abused: No    Physically abused: No    Forced sexual activity: No  Other Topics Concern  . Not on file  Social History Narrative   Patient drinks 1 cup of coffee daily.   Patient is right handed.     Outpatient Encounter Medications as of 07/03/2019  Medication Sig  . amLODipine (NORVASC) 5 MG tablet Take 1 tablet (5 mg total) by mouth daily. (Patient taking differently: Take 10 mg by mouth daily. )  . atorvastatin (LIPITOR) 10 MG tablet Take 1 tablet (10 mg total) by mouth daily.  . blood glucose meter kit and supplies KIT Dispense based on patient and insurance preference. Use up to four  times daily as directed. (FOR ICD-9 250.00, 250.01).  . cholecalciferol (VITAMIN D3) 25 MCG (1000 UT) tablet Take 1,000 Units by mouth daily.  . furosemide (LASIX) 20 MG tablet Take 1.5 tablets (30 mg total) by mouth daily as needed. (Patient taking differently: Take 20 mg by mouth daily. )  . glucose blood (ONE TOUCH ULTRA TEST) test strip Test up to tid. DX E11.9  . metFORMIN (GLUCOPHAGE) 500 MG tablet Take 0.5 tablets (250 mg total) by mouth daily with breakfast.  . olmesartan (BENICAR) 40 MG tablet Take 1 tablet (40 mg total) by mouth daily.  . rivaroxaban (XARELTO) 20 MG TABS tablet Take 1 tablet by mouth once daily with food  . fluticasone (FLONASE) 50 MCG/ACT nasal spray Place 2 sprays into both nostrils daily.  . [DISCONTINUED] SPS 15 GM/60ML suspension TAKE 120 MLS BY MOUTH ONCE FOR 1 DOSE   No facility-administered encounter medications on file as of 07/03/2019.     No Known Allergies  Review of Systems  Constitutional: Negative for activity change, appetite change, chills, diaphoresis, fatigue, fever and unexpected weight change.  HENT: Positive for rhinorrhea.   Eyes: Negative.  Negative for photophobia and visual disturbance.  Respiratory: Negative for cough, chest tightness and shortness of breath.   Cardiovascular: Negative for chest pain, palpitations and leg swelling.  Gastrointestinal: Negative for abdominal pain, blood in stool, constipation, diarrhea, nausea and vomiting.  Endocrine: Negative.  Negative for polydipsia, polyphagia and polyuria.  Genitourinary: Negative for decreased urine volume, difficulty urinating, dysuria, frequency and urgency.  Musculoskeletal: Negative for arthralgias and myalgias.  Skin: Negative.   Allergic/Immunologic: Negative.   Neurological: Negative for dizziness, tremors, seizures, syncope, facial asymmetry, speech difficulty, weakness, light-headedness, numbness and headaches.  Hematological: Negative.   Psychiatric/Behavioral: Negative  for confusion, hallucinations, sleep disturbance and suicidal ideas.  All other systems reviewed and are negative.       Objective:  BP 123/75   Pulse 97   Temp 98.7 F (37.1 C)   Resp 20   Ht _0  (  1.702 m)   Wt 229 lb (103.9 kg)   SpO2 99%   BMI 35.87 kg/m    Wt Readings from Last 3 Encounters:  07/03/19 229 lb (103.9 kg)  03/15/19 228 lb (103.4 kg)  12/17/18 242 lb (109.8 kg)    Physical Exam Vitals signs and nursing note reviewed.  Constitutional:      General: He is not in acute distress.    Appearance: Normal appearance. He is well-developed and well-groomed. He is morbidly obese. He is not ill-appearing, toxic-appearing or diaphoretic.  HENT:     Head: Normocephalic and atraumatic.     Jaw: There is normal jaw occlusion.     Right Ear: Hearing normal.     Left Ear: Hearing normal.     Nose: Congestion and rhinorrhea present. Rhinorrhea is clear.     Right Turbinates: Swollen and pale.     Left Turbinates: Swollen and pale.     Right Sinus: No maxillary sinus tenderness or frontal sinus tenderness.     Left Sinus: No maxillary sinus tenderness or frontal sinus tenderness.     Mouth/Throat:     Lips: Pink.     Mouth: Mucous membranes are moist.     Pharynx: Oropharynx is clear. Uvula midline.  Eyes:     General: Lids are normal.     Extraocular Movements: Extraocular movements intact.     Conjunctiva/sclera: Conjunctivae normal.     Pupils: Pupils are equal, round, and reactive to light.  Neck:     Musculoskeletal: Normal range of motion and neck supple.     Thyroid: No thyroid mass, thyromegaly or thyroid tenderness.     Vascular: No carotid bruit or JVD.     Trachea: Trachea and phonation normal.  Cardiovascular:     Rate and Rhythm: Normal rate and regular rhythm.     Chest Wall: PMI is not displaced.     Pulses: Normal pulses.          Dorsalis pedis pulses are 2+ on the right side and 2+ on the left side.       Posterior tibial pulses are 2+ on the  right side and 2+ on the left side.     Heart sounds: Normal heart sounds. No murmur. No friction rub. No gallop.   Pulmonary:     Effort: Pulmonary effort is normal. No respiratory distress.     Breath sounds: Normal breath sounds. No wheezing.  Abdominal:     General: Bowel sounds are normal. There is no distension or abdominal bruit.     Palpations: Abdomen is soft. There is no hepatomegaly or splenomegaly.     Tenderness: There is no abdominal tenderness. There is no right CVA tenderness or left CVA tenderness.     Hernia: No hernia is present.  Musculoskeletal: Normal range of motion.     Right lower leg: No edema.     Left lower leg: No edema.  Feet:     Right foot:     Protective Sensation: 10 sites tested. 10 sites sensed.     Skin integrity: Skin integrity normal.     Toenail Condition: Right toenails are abnormally thick.     Left foot:     Protective Sensation: 10 sites tested. 10 sites sensed.     Skin integrity: Skin integrity normal.     Toenail Condition: Left toenails are abnormally thick.  Lymphadenopathy:     Cervical: No cervical adenopathy.  Skin:    General: Skin is  warm and dry.     Capillary Refill: Capillary refill takes less than 2 seconds.     Coloration: Skin is not cyanotic, jaundiced or pale.     Findings: No rash.  Neurological:     General: No focal deficit present.     Mental Status: He is alert and oriented to person, place, and time.     Cranial Nerves: Cranial nerves are intact. No cranial nerve deficit.     Sensory: Sensation is intact. No sensory deficit.     Motor: Motor function is intact. No weakness.     Coordination: Coordination is intact. Coordination normal.     Gait: Gait is intact. Gait normal.     Deep Tendon Reflexes: Reflexes are normal and symmetric. Reflexes normal.  Psychiatric:        Attention and Perception: Attention and perception normal.        Mood and Affect: Mood and affect normal.        Speech: Speech normal.         Behavior: Behavior normal. Behavior is cooperative.        Thought Content: Thought content normal.        Cognition and Memory: Cognition and memory normal.        Judgment: Judgment normal.     Results for orders placed or performed in visit on 03/18/19  BMP8+EGFR  Result Value Ref Range   Glucose 167 (H) 65 - 99 mg/dL   BUN 32 (H) 8 - 27 mg/dL   Creatinine, Ser 2.50 (H) 0.76 - 1.27 mg/dL   GFR calc non Af Amer 24 (L) >59 mL/min/1.73   GFR calc Af Amer 27 (L) >59 mL/min/1.73   BUN/Creatinine Ratio 13 10 - 24   Sodium 137 134 - 144 mmol/L   Potassium 4.7 3.5 - 5.2 mmol/L   Chloride 107 (H) 96 - 106 mmol/L   CO2 17 (L) 20 - 29 mmol/L   Calcium 8.8 8.6 - 10.2 mg/dL       Pertinent labs & imaging results that were available during my care of the patient were reviewed by me and considered in my medical decision making.  Assessment & Plan:  Isayah was seen today for medical management of chronic issues, diabetes, hyperlipidemia and hypertension.  Diagnoses and all orders for this visit:  Type 2 diabetes mellitus with vascular disease ([place]) A1C 5.8 today. No changes in current regimen. Report any persistent high or low readings.  -     CBC with Differential/Platelet -     Bayer DCA Hb A1c Waived  Chronic kidney disease (CKD), stage IV (severe) ([place]) Followed by nephrology. No change in urine output. No confusion, weakness, or fatigue.  -     CBC with Differential/Platelet -     CMP14+EGFR  Hyperlipidemia associated with type 2 diabetes mellitus ([place]) Diet encouraged - increase intake of fresh fruits and vegetables, increase intake of lean proteins. Bake, broil, or grill foods. Avoid fried, greasy, and fatty foods. Avoid fast foods. Increase intake of fiber-rich whole grains. Exercise encouraged - at least 150 minutes per week and advance as tolerated.  Goal BMI < 25. Continue medications as prescribed. Follow up in 3-6 months as discussed.  -     CBC with  Differential/Platelet -     Lipid panel  Hypertension associated with type 2 diabetes mellitus (H[facility]) BP well controlled. Changes were not made in regimen today. Goal BP is 130/80. Pt aware to report any persistent high  or low readings. DASH diet and exercise encouraged. Exercise at least 150 minutes per week and increase as tolerated. Goal BMI > 25. Stress management encouraged. Avoid nicotine and tobacco product use. Avoid excessive alcohol and NSAID's. Avoid more than 2000 mg of sodium daily. Medications as prescribed. Follow up as scheduled.  -     CBC with Differential/Platelet -     CMP14+EGFR  Vitamin D deficiency Labs pending. Continue repletion therapy. If indicated, will change repletion dosage. Eat foods rich in Vit D including milk, orange juice, yogurt with vitamin D added, salmon or mackerel, canned tuna fish, cereals with vitamin D added, and cod liver oil. Get out in the sun but make sure to wear at least SPF 30 sunscreen.  -     Vitamin D 25 hydroxy  Anemia associated with chronic renal failure No fatigue, weakness, abnormal bleeding or bruising. Will recheck CBC today.  -     CBC with Differential/Platelet  Morbid obesity (HCC) Diet and exercise encouraged. Labs pending.  -     CBC with Differential/Platelet -     CMP14+EGFR -     Lipid panel  Non-seasonal allergic rhinitis due to pollen Symptomatic care discussed. Will trial Flonase. Report any new or worsening symptoms.  -     fluticasone (FLONASE) 50 MCG/ACT nasal spray; Place 2 sprays into both nostrils daily.     Continue all other maintenance medications.  Follow up plan: Return in about 3 months (around 10/03/2019), or if symptoms worsen or fail to improve.  Continue healthy lifestyle choices, including diet (rich in fruits, vegetables, and lean proteins, and low in salt and simple carbohydrates) and exercise (at least 30 minutes of moderate physical activity daily).  Educational handout given for survey,  COVID-19  The above assessment and management plan was discussed with the patient. The patient verbalized understanding of and has agreed to the management plan. Patient is aware to call the clinic if they develop any new symptoms or if symptoms persist or worsen. Patient is aware when to return to the clinic for a follow-up visit. Patient educated on when it is appropriate to go to the emergency department.   Monia Pouch, FNP-C [facility] [phone]

## 2019-07-04 LAB — CBC WITH DIFFERENTIAL/PLATELET
Basophils Absolute: 0 10*3/uL (ref 0.0–0.2)
Basos: 0 %
EOS (ABSOLUTE): 0.1 10*3/uL (ref 0.0–0.4)
Eos: 1 %
Hematocrit: 41.1 % (ref 37.5–51.0)
Hemoglobin: 14 g/dL (ref 13.0–17.7)
Immature Grans (Abs): 0 10*3/uL (ref 0.0–0.1)
Immature Granulocytes: 0 %
Lymphocytes Absolute: 2.5 10*3/uL (ref 0.7–3.1)
Lymphs: 28 %
MCH: 30.4 pg (ref 26.6–33.0)
MCHC: 34.1 g/dL (ref 31.5–35.7)
MCV: 89 fL (ref 79–97)
Monocytes Absolute: 0.8 10*3/uL (ref 0.1–0.9)
Monocytes: 8 %
Neutrophils Absolute: 5.7 10*3/uL (ref 1.4–7.0)
Neutrophils: 63 %
Platelets: 213 10*3/uL (ref 150–450)
RBC: 4.6 x10E6/uL (ref 4.14–5.80)
RDW: 13.8 % (ref 11.6–15.4)
WBC: 9.2 10*3/uL (ref 3.4–10.8)

## 2019-07-04 LAB — VITAMIN D 25 HYDROXY (VIT D DEFICIENCY, FRACTURES): Vit D, 25-Hydroxy: 33 ng/mL (ref 30.0–100.0)

## 2019-07-04 LAB — CMP14+EGFR
ALT: 12 IU/L (ref 0–44)
AST: 17 IU/L (ref 0–40)
Albumin/Globulin Ratio: 1.8 (ref 1.2–2.2)
Albumin: 4.3 g/dL (ref 3.7–4.7)
Alkaline Phosphatase: 75 IU/L (ref 39–117)
BUN/Creatinine Ratio: 17 (ref 10–24)
BUN: 41 mg/dL — ABNORMAL HIGH (ref 8–27)
Bilirubin Total: 0.3 mg/dL (ref 0.0–1.2)
CO2: 17 mmol/L — ABNORMAL LOW (ref 20–29)
Calcium: 9.1 mg/dL (ref 8.6–10.2)
Chloride: 106 mmol/L (ref 96–106)
Creatinine, Ser: 2.37 mg/dL — ABNORMAL HIGH (ref 0.76–1.27)
GFR calc Af Amer: 29 mL/min/{1.73_m2} — ABNORMAL LOW (ref >59)
GFR calc non Af Amer: 25 mL/min/{1.73_m2} — ABNORMAL LOW (ref >59)
Globulin, Total: 2.4 g/dL (ref 1.5–4.5)
Glucose: 153 mg/dL — ABNORMAL HIGH (ref 65–99)
Potassium: 5.1 mmol/L (ref 3.5–5.2)
Sodium: 138 mmol/L (ref 134–144)
Total Protein: 6.7 g/dL (ref 6.0–8.5)

## 2019-07-04 LAB — LIPID PANEL
Chol/HDL Ratio: 3.3 ratio (ref 0.0–5.0)
Cholesterol, Total: 115 mg/dL (ref 100–199)
HDL: 35 mg/dL — ABNORMAL LOW (ref >39)
LDL Chol Calc (NIH): 37 mg/dL (ref 0–99)
Triglycerides: 288 mg/dL — ABNORMAL HIGH (ref 0–149)
VLDL Cholesterol Cal: 43 mg/dL — ABNORMAL HIGH (ref 5–40)

## 2019-07-08 ENCOUNTER — Encounter: Payer: Self-pay | Admitting: Family Medicine

## 2019-07-22 ENCOUNTER — Other Ambulatory Visit: Payer: Self-pay | Admitting: Family Medicine

## 2019-07-30 ENCOUNTER — Other Ambulatory Visit: Payer: Self-pay | Admitting: Family Medicine

## 2019-08-06 ENCOUNTER — Ambulatory Visit (INDEPENDENT_AMBULATORY_CARE_PROVIDER_SITE_OTHER): Payer: Medicare Other | Admitting: *Deleted

## 2019-08-06 DIAGNOSIS — Z0000 Encounter for general adult medical examination without abnormal findings: Secondary | ICD-10-CM

## 2019-08-06 NOTE — Patient Instructions (Signed)
Preventive Care 78 Years and Older, Male Preventive care refers to lifestyle choices and visits with your health care provider that can promote health and wellness. This includes:  A yearly physical exam. This is also called an annual well check.  Regular dental and eye exams.  Immunizations.  Screening for certain conditions.  Healthy lifestyle choices, such as diet and exercise. What can I expect for my preventive care visit? Physical exam Your health care provider will check:  Height and weight. These may be used to calculate body mass index (BMI), which is a measurement that tells if you are at a healthy weight.  Heart rate and blood pressure.  Your skin for abnormal spots. Counseling Your health care provider may ask you questions about:  Alcohol, tobacco, and drug use.  Emotional well-being.  Home and relationship well-being.  Sexual activity.  Eating habits.  History of falls.  Memory and ability to understand (cognition).  Work and work Statistician. What immunizations do I need?  Influenza (flu) vaccine  This is recommended every year. Tetanus, diphtheria, and pertussis (Tdap) vaccine  You may need a Td booster every 10 years. Varicella (chickenpox) vaccine  You may need this vaccine if you have not already been vaccinated. Zoster (shingles) vaccine  You may need this after age 16. Pneumococcal conjugate (PCV13) vaccine  One dose is recommended after age 5. Pneumococcal polysaccharide (PPSV23) vaccine  One dose is recommended after age 16. Measles, mumps, and rubella (MMR) vaccine  You may need at least one dose of MMR if you were born in 1957 or later. You may also need a second dose. Meningococcal conjugate (MenACWY) vaccine  You may need this if you have certain conditions. Hepatitis A vaccine  You may need this if you have certain conditions or if you travel or work in places where you may be exposed to hepatitis A. Hepatitis B  vaccine  You may need this if you have certain conditions or if you travel or work in places where you may be exposed to hepatitis B. Haemophilus influenzae type b (Hib) vaccine  You may need this if you have certain conditions. You may receive vaccines as individual doses or as more than one vaccine together in one shot (combination vaccines). Talk with your health care provider about the risks and benefits of combination vaccines. What tests do I need? Blood tests  Lipid and cholesterol levels. These may be checked every 5 years, or more frequently depending on your overall health.  Hepatitis C test.  Hepatitis B test. Screening  Lung cancer screening. You may have this screening every year starting at age 2 if you have a 30-pack-year history of smoking and currently smoke or have quit within the past 15 years.  Colorectal cancer screening. All adults should have this screening starting at age 35 and continuing until age 5. Your health care provider may recommend screening at age 40 if you are at increased risk. You will have tests every 1-10 years, depending on your results and the type of screening test.  Prostate cancer screening. Recommendations will vary depending on your family history and other risks.  Diabetes screening. This is done by checking your blood sugar (glucose) after you have not eaten for a while (fasting). You may have this done every 1-3 years.  Abdominal aortic aneurysm (AAA) screening. You may need this if you are a current or former smoker.  Sexually transmitted disease (STD) testing. Follow these instructions at home: Eating and drinking  Eat  a diet that includes fresh fruits and vegetables, whole grains, lean protein, and low-fat dairy products. Limit your intake of foods with high amounts of sugar, saturated fats, and salt.  Take vitamin and mineral supplements as recommended by your health care provider.  Do not drink alcohol if your health care  provider tells you not to drink.  If you drink alcohol: ? Limit how much you have to 0-2 drinks a day. ? Be aware of how much alcohol is in your drink. In the [place] one drink equals one 12 oz bottle of beer (355 mL), one 5 oz glass of wine (148 mL), or one 1 oz glass of hard liquor (44 mL). Lifestyle  Take daily care of your teeth and gums.  Stay active. Exercise for at least 30 minutes on 5 or more days each week.  Do not use any products that contain nicotine or tobacco, such as cigarettes, e-cigarettes, and chewing tobacco. If you need help quitting, ask your health care provider.  If you are sexually active, practice safe sex. Use a condom or other form of protection to prevent STIs (sexually transmitted infections).  Talk with your health care provider about taking a low-dose aspirin or statin. What's next?  Visit your health care provider once a year for a well check visit.  Ask your health care provider how often you should have your eyes and teeth checked.  Stay up to date on all vaccines. This information is not intended to replace advice given to you by your health care provider. Make sure you discuss any questions you have with your health care provider. Document Released: 08/28/2015 Document Revised: 07/26/2018 Document Reviewed: 07/26/2018 Elsevier Patient Education  2020 [employer]  

## 2019-08-06 NOTE — Progress Notes (Signed)
MEDICARE ANNUAL WELLNESS VISIT  08/06/2019  Telephone Visit Disclaimer This Medicare AWV was conducted by telephone due to national recommendations for restrictions regarding the COVID-19 Pandemic (e.g. social distancing).  I verified, using two identifiers, that I am speaking with Ricardo Mayer. or their authorized healthcare agent. I discussed the limitations, risks, security, and privacy concerns of performing an evaluation and management service by telephone and the potential availability of an in-person appointment in the future. The patient expressed understanding and agreed to proceed.   Subjective:  Josian Lanese. is a 78 y.o. male patient of [name], Connye Burkitt, FNP who had a Medicare Annual Wellness Visit today via telephone. Ralphie is Retired and lives with their spouse. he has 3 children. he reports that he is socially active and does interact with friends/family regularly. he is moderately physically active and enjoys reading, watching sporting events on TV, yard work and playing with his grandkids.  Patient Care Team: Baruch Gouty, FNP as PCP - General (Family Medicine) Kathrynn Ducking, MD as Consulting Physician (Neurology) Jettie Booze, MD as Consulting Physician (Cardiology) Warren Danes, PA-C as Physician Assistant (Dermatology) Elmarie Shiley, MD as Consulting Physician (Nephrology) Irine Seal, MD as Attending Physician (Urology)  Advanced Directives 08/06/2019 10/31/2018 07/05/2018 07/10/2017 07/10/2017 04/08/2016 03/24/2016  Does Patient Have a Medical Advance Directive? Yes Yes Yes Yes No Yes Yes  Type of Paramedic of [place];Living will [place];Living will [place];Living will [place];Living will - [place];Living will Living will;Healthcare Power of Attorney  Does patient want to make changes to medical advance directive? No - Patient declined  - No - Patient declined No - Patient declined - - -  Copy of [place] in Chart? Yes - validated most recent copy scanned in chart (See row information) - Yes - validated most recent copy scanned in chart (See row information) No - copy requested - No - copy requested -  Would patient like information on creating a medical advance directive? - - - No - Patient declined No - Patient declined - [facility] Utilization Over the Past 12 Months: # of hospitalizations or ER visits: 0 # of surgeries: 0  Review of Systems    Patient reports that his overall health is unchanged compared to last year.  History obtained from chart review  Patient Reported Readings (BP, Pulse, CBG, Weight, etc) none  Pain Assessment Pain : No/denies pain     Current Medications & Allergies (verified) Allergies as of 08/06/2019   No Known Allergies     Medication List       Accurate as of August 06, 2019 10:03 AM. If you have any questions, ask your nurse or doctor.        amLODipine 5 MG tablet Commonly known as: NORVASC Take 1 tablet (5 mg total) by mouth daily. What changed: how much to take   atorvastatin 10 MG tablet Commonly known as: LIPITOR Take 1 tablet by mouth once daily   blood glucose meter kit and supplies Kit Dispense based on patient and insurance preference. Use up to four times daily as directed. (FOR ICD-9 250.00, 250.01).   cholecalciferol 25 MCG (1000 UT) tablet Commonly known as: VITAMIN D3 Take 1,000 Units by mouth daily.   fluticasone 50 MCG/ACT nasal spray Commonly known as: FLONASE Place 2 sprays into both nostrils daily.   furosemide 20  MG tablet Commonly known as: LASIX Take 1.5 tablets (30 mg total) by mouth daily as needed. What changed:   how much to take  when to take this   glucose blood test strip Commonly known as: ONE TOUCH ULTRA TEST Test up to tid. DX E11.9   metFORMIN 500 MG tablet Commonly known as: GLUCOPHAGE TAKE  ONE-HALF TABLET BY  MOUTH DAILY WITH BREAKFAST   olmesartan 40 MG tablet Commonly known as: BENICAR Take 1 tablet (40 mg total) by mouth daily.   Xarelto 20 MG Tabs tablet Generic drug: rivaroxaban TAKE 1 TABLET BY MOUTH ONCE DAILY WITH FOOD       History (reviewed): Past Medical History:  Diagnosis Date  . Adenomatous polyp of colon   . Arthritis    bilateral knees  . CKD (chronic kidney disease), stage II    Dr. Patel,nephrology "stable last 3 yrs"  . 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 Diabetes mellitus without complication ([place])    Dr. Laurance Flatten.PCP  . History of basal cell cancer   . History of DVT of lower extremity    2011--  LEFT LEG  . History of kidney stones   . History of melanoma excision    RIGHT ARM  . Hyperlipidemia   . Hypertension   . Impaired hearing    hearing aids bilateral  . Melanoma ([place]) 1990   arm and back -Melanoma, basal cell others- Dr. Edgardo Roys follows.  . Near syncope 08/25/2015   Dr. Roland Rack once"found blood clots in lung"  . Nocturia   . Prostate cancer [facility]) 2014   Dr. Cherylann Parr- "seed implant" '14  . Ulcer of left lower leg ([place])    recent pcp visit dr Laurance Flatten--  ordered betadine wet/dry dsg daily (03-18-2013)  [place] IN 1 WEEK   Past Surgical History:  Procedure Laterality Date  . COLONOSCOPY    . KIDNEY STONE EXTRACTIONS  LAST ONE 1970   X2 OPEN/  X1  URETEROSCOPY  . MELANOMA EXCISION RIGHT ARM  1990  . POLYPECTOMY    . PROSTATE BIOPSY    . RADIOACTIVE SEED IMPLANT N/A 03/21/2013   Procedure: RADIOACTIVE SEED IMPLANT;  Surgeon: Malka So, MD;  Location: [facility];  Service: Urology;  Laterality: N/A;  87 seeds implanted    . TONSILLECTOMY    . TOTAL KNEE ARTHROPLASTY Right 03/21/2016   Procedure: RIGHT TOTAL KNEE ARTHROPLASTY;  Surgeon: Paralee Cancel, MD;  Location: [facility];  Service: Orthopedics;  Laterality: Right;   Family History  Problem Relation Age of  Onset  . Breast cancer Mother   . Stroke Mother   . Hypertension Mother   . Lung cancer Father        smoked  . Ulcers Father   . Heart disease Brother 65       stent  . Esophageal cancer Brother   . Cancer Paternal Uncle        prostate  . Cancer Paternal Grandfather        prostate  . Heart attack Neg Hx   . Colon cancer Neg Hx   . Colon polyps Neg Hx   . Rectal cancer Neg Hx   . Stomach cancer Neg Hx    Social History   Socioeconomic History  . Marital status: Married    Spouse name: Janelle  . Number of children: 3  . Years of education: master's  . Highest education level:  Master's degree (e.g., MA, MS, MEng, MEd, MSW, MBA)  Occupational History  . Occupation: Retail buyer  Tobacco Use  . Smoking status: Never Smoker  . Smokeless tobacco: Never Used  Substance and Sexual Activity  . Alcohol use: No  . Drug use: No  . Sexual activity: Not Currently  Other Topics Concern  . Not on file  Social History Narrative   Patient drinks 1 cup of coffee daily.   Patient is right handed.    Social Determinants of Health   Financial Resource Strain: Low Risk   . Difficulty of Paying Living Expenses: Not hard at all  Food Insecurity: No Food Insecurity  . Worried About Charity fundraiser in the Last Year: Never true  . Ran Out of Food in the Last Year: Never true  Transportation Needs: No Transportation Needs  . Lack of Transportation (Medical): No  . Lack of Transportation (Non-Medical): No  Physical Activity: Sufficiently Active  . Days of Exercise per Week: 7 days  . Minutes of Exercise per Session: 30 min  Stress: No Stress Concern Present  . Feeling of Stress : Not at all  Social Connections: Not Isolated  . Frequency of Communication with Friends and Family: More than three times a week  . Frequency of Social Gatherings with Friends and Family: More than three times a week  . Attends Religious Services: More than 4 times per year  . Active Member of  Clubs or Organizations: Yes  . Attends Archivist Meetings: More than 4 times per year  . Marital Status: Married    Activities of Daily Living In your present state of health, do you have any difficulty performing the following activities: 08/06/2019  Hearing? Y  Comment hard of hearing-wears bilateral hearing aids  Vision? N  Comment wears prescription reading glasses-gets yearly eye exam  Difficulty concentrating or making decisions? N  Walking or climbing stairs? Y  Comment uses a cane  Dressing or bathing? N  Doing errands, shopping? N  Preparing Food and eating ? N  Using the Toilet? N  In the past six months, have you accidently leaked urine? Y  Comment after prostate surgery-wears depends  Do you have problems with loss of bowel control? N  Managing your Medications? N  Managing your Finances? N  Housekeeping or managing your Housekeeping? N  Some recent data might be hidden    Patient Education/ Literacy How often do you need to have someone help you when you read instructions, pamphlets, or other written materials from your doctor or pharmacy?: 1 - Never What is the last grade level you completed in school?: Masters Degree  Exercise Current Exercise Habits: Home exercise routine, Type of exercise: walking, Time (Minutes): 30, Frequency (Times/Week): 7, Weekly Exercise (Minutes/Week): 210, Intensity: Moderate, Exercise limited by: orthopedic condition(s)  Diet Patient reports consuming 3 meals a day and 1 snack(s) a day Patient reports that his primary diet is: Regular Patient reports that she does have regular access to food.   Depression Screen PHQ 2/9 Scores 08/06/2019 07/03/2019 03/15/2019 10/24/2018 10/15/2018 09/05/2018 07/05/2018  PHQ - 2 Score 0 0 0 0 0 0 0     Fall Risk Fall Risk  08/06/2019 07/03/2019 03/15/2019 10/24/2018 10/15/2018  Falls in the past year? 0 0 0 0 0  Number falls in past yr: - - - - -  Comment - - - - -  Injury with Fall? - - - -  -  Follow up - - - - -  Objective:  Ricardo Mayer. seemed alert and oriented and he participated appropriately during our telephone visit.  Blood Pressure Weight BMI  BP Readings from Last 3 Encounters:  07/03/19 123/75  03/15/19 136/76  12/17/18 (!) 151/93   Wt Readings from Last 3 Encounters:  07/03/19 229 lb (103.9 kg)  03/15/19 228 lb (103.4 kg)  12/17/18 242 lb (109.8 kg)   BMI Readings from Last 1 Encounters:  07/03/19 35.87 kg/m    *Unable to obtain current vital signs, weight, and BMI due to telephone visit type  Hearing/Vision  . Abdulmalik did not seem to have difficulty with hearing/understanding during the telephone conversation . Reports that he has not had a formal eye exam by an eye care professional within the past year . Reports that he has not had a formal hearing evaluation within the past year *Unable to fully assess hearing and vision during telephone visit type  Cognitive Function: 6CIT Screen 08/06/2019  What Year? 0 points  What month? 0 points  What time? 0 points  Count back from 20 0 points  Months in reverse 0 points  Repeat phrase 2 points  Total Score 2   (Normal:0-7, Significant for Dysfunction: >8)  Normal Cognitive Function Screening: Yes   Immunization & Health Maintenance Record Immunization History  Administered Date(s) Administered  . Fluad Quad(high Dose 65+) 05/16/2019  . Influenza Whole 05/20/2016  . Influenza-Unspecified 05/15/2013, 05/15/2014, 06/09/2015  . Pneumococcal Conjugate-13 10/09/2013  . Pneumococcal Polysaccharide-23 07/05/2018  . Tdap 04/07/2011    Health Maintenance  Topic Date Due  . OPHTHALMOLOGY EXAM  04/07/2019  . HEMOGLOBIN A1C  12/31/2019  . FOOT EXAM  07/02/2020  . TETANUS/TDAP  04/06/2021  . COLONOSCOPY  12/17/2023  . INFLUENZA VACCINE  Completed  . PNA vac Low Risk Adult  Completed       Assessment  This is a routine wellness examination for [employer].Marland Kitchen  Health Maintenance:  Due or Overdue Health Maintenance Due  Topic Date Due  . OPHTHALMOLOGY EXAM  04/07/2019    Ricardo Mayer. does not need a referral for Community Assistance: Care Management:   no Social Work:    no Prescription Assistance:  no Nutrition/Diabetes Education:  no   Plan:  Personalized Goals Goals Addressed            This Visit's Progress   . DIET - INCREASE WATER INTAKE       Try to drink 6-8 glasses of water daily      Personalized Health Maintenance & Screening Recommendations  Shingles vaccine  Lung Cancer Screening Recommended: no (Low Dose CT Chest recommended if Age 41-80 years, 30 pack-year currently smoking OR have quit w/in past 15 years) Hepatitis C Screening recommended: no HIV Screening recommended: no  Advanced Directives: Written information was not prepared per patient's request.  Referrals & Orders No orders of the defined types were placed in this encounter.   Follow-up Plan . Follow-up with Baruch Gouty, FNP as planned . Schedule your Diabetic Eye Exam as discussed  . Consider Shingles vaccine at your next visit with your PCP   I have personally reviewed and noted the following in the patient's chart:   . Medical and social history . Use of alcohol, tobacco or illicit drugs  . Current medications and supplements . Functional ability and status . Nutritional status . Physical activity . Advanced directives . List of other physicians . Hospitalizations, surgeries, and ER visits in previous 12 months .  Vitals . Screenings to include cognitive, depression, and falls . Referrals and appointments  In addition, I have reviewed and discussed with Ricardo Mayer. certain preventive protocols, quality metrics, and best practice recommendations. A written personalized care plan for preventive services as well as general preventive health recommendations is available and can be mailed to the patient at his request.      Milas Hock,  LPN  [id number]

## 2019-10-04 ENCOUNTER — Ambulatory Visit: Payer: Medicare Other | Admitting: Family Medicine

## 2019-10-04 ENCOUNTER — Other Ambulatory Visit: Payer: Self-pay

## 2019-10-07 ENCOUNTER — Telehealth: Payer: Self-pay | Admitting: *Deleted

## 2019-10-07 ENCOUNTER — Ambulatory Visit (INDEPENDENT_AMBULATORY_CARE_PROVIDER_SITE_OTHER): Payer: Medicare Other | Admitting: Family Medicine

## 2019-10-07 ENCOUNTER — Encounter: Payer: Self-pay | Admitting: Family Medicine

## 2019-10-07 ENCOUNTER — Other Ambulatory Visit: Payer: Self-pay

## 2019-10-07 VITALS — BP 126/79 | HR 93 | Temp 98.9°F | Resp 20 | Ht 67.0 in | Wt 229.0 lb

## 2019-10-07 DIAGNOSIS — E559 Vitamin D deficiency, unspecified: Secondary | ICD-10-CM | POA: Diagnosis not present

## 2019-10-07 DIAGNOSIS — N184 Chronic kidney disease, stage 4 (severe): Secondary | ICD-10-CM | POA: Diagnosis not present

## 2019-10-07 DIAGNOSIS — E1159 Type 2 diabetes mellitus with other circulatory complications: Secondary | ICD-10-CM

## 2019-10-07 DIAGNOSIS — E1169 Type 2 diabetes mellitus with other specified complication: Secondary | ICD-10-CM

## 2019-10-07 DIAGNOSIS — E785 Hyperlipidemia, unspecified: Secondary | ICD-10-CM

## 2019-10-07 DIAGNOSIS — I10 Essential (primary) hypertension: Secondary | ICD-10-CM

## 2019-10-07 LAB — BAYER DCA HB A1C WAIVED: HB A1C (BAYER DCA - WAIVED): 5.8 % (ref <7.0)

## 2019-10-07 NOTE — Addendum Note (Signed)
Addended by: Baruch Gouty on: 10/07/2019 09:25 AM   Modules accepted: Orders

## 2019-10-07 NOTE — Progress Notes (Addendum)
Subjective:  Patient ID: Ricardo Huguenin., male    DOB: 1941/07/15, 79 y.o.   MRN: [id number]  Patient Care Team: Baruch Gouty, FNP as PCP - General (Family Medicine) Kathrynn Ducking, MD as Consulting Physician (Neurology) Jettie Booze, MD as Consulting Physician (Cardiology) Warren Danes, PA-C as Physician Assistant (Dermatology) Elmarie Shiley, MD as Consulting Physician (Nephrology) Irine Seal, MD as Attending Physician (Urology)   Chief Complaint:  Medical Management of Chronic Issues (3 mo ), Diabetes, Hypertension, and Hyperlipidemia   HPI: Ricardo Mayer. is a 79 y.o. male presenting on 10/07/2019 for Medical Management of Chronic Issues (3 mo ), Diabetes, Hypertension, and Hyperlipidemia   1. Type 2 diabetes mellitus with vascular disease ([place]) Patient presents to follow up on DM2. Patient denies hyperglycemia, appetite changes, nausea, parasthesias of feet, polydipsia, polyuria, weight loss or gain. Does foot exams at home, denies any wounds.  Patient does endorse some visual changes in L eye, seeing retina specialist Wednesday. Specialist was concerned about possible retinal hemorrhage and would like for pt to hold Xarelto therapy for 3-4 weeks. 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 He has not experienced a recurrent DVT since this time. He denies calf swelling, chest pain, palpitations, shortness of breath, dizziness, or syncope. No lower extremity color changes or pain.   2. Hyperlipidemia associated with type 2 diabetes mellitus ([place]) Patient is compliant with medications, tolerating atorvastatin well without any side effects. Is eating a well balanced diet and walks daily.   3. Hypertension associated with type 2 diabetes mellitus (HCC) BP WNL in office, 126/79. Checks BP at home regularly with readings 120s/70s-150s/80s. Patient denies any headaches, blurred vision, dizziness or lightheadedness.   4. Chronic kidney  disease (CKD), stage IV (severe) ([place]) Seeing doctor Posey Pronto for ongoing monitoring, next follow up is march 10th.   5. Vitamin D deficiency paitent is taking oral repletion. Denies bone pain or tenderness, muscle weakness, of difficulty walking.      Relevant past medical, surgical, family, and social history reviewed and updated as indicated.  Allergies and medications reviewed and updated. Date reviewed: Chart in Epic.   Past Medical History:  Diagnosis Date  . Adenomatous polyp of colon   . Arthritis    bilateral knees  . CKD (chronic kidney disease), stage II    Dr. Patel,nephrology "stable last 3 yrs"  . 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 Diabetes mellitus without complication ([place])    Dr. Laurance Flatten.PCP  . History of basal cell cancer   . History of DVT of lower extremity    2011--  LEFT LEG  . History of kidney stones   . History of melanoma excision    RIGHT ARM  . Hyperlipidemia   . Hypertension   . Impaired hearing    hearing aids bilateral  . Melanoma ([place]) 1990   arm and back -Melanoma, basal cell others- Dr. Edgardo Roys follows.  . Near syncope 08/25/2015   Dr. Roland Rack once"found blood clots in lung"  . Nocturia   . Prostate cancer [facility]) 2014   Dr. Cherylann Parr- "seed implant" '14  . Ulcer of left lower leg ([place])    recent pcp visit dr Laurance Flatten--  ordered betadine wet/dry dsg daily (03-18-2013)  [place] IN 1 WEEK    Past Surgical History:  Procedure Laterality Date  . COLONOSCOPY    .  KIDNEY STONE EXTRACTIONS  LAST ONE 1970   X2 OPEN/  X1  URETEROSCOPY  . MELANOMA EXCISION RIGHT ARM  1990  . POLYPECTOMY    . PROSTATE BIOPSY    . RADIOACTIVE SEED IMPLANT N/A 03/21/2013   Procedure: RADIOACTIVE SEED IMPLANT;  Surgeon: Malka So, MD;  [facility];  Service: Urology;  Laterality: N/A;  87 seeds implanted    . TONSILLECTOMY    . TOTAL KNEE ARTHROPLASTY Right 03/21/2016   Procedure: RIGHT  TOTAL KNEE ARTHROPLASTY;  Surgeon: Paralee Cancel, MD;  Location: [facility];  Service: Orthopedics;  Laterality: Right;    Social History   Socioeconomic History  . Marital status: Married    Spouse name: Janelle  . Number of children: 3  . Years of education: master's  . Highest education level: Master's degree (e.g., MA, MS, MEng, MEd, MSW, MBA)  Occupational History  . Occupation: Retail buyer  Tobacco Use  . Smoking status: Never Smoker  . Smokeless tobacco: Never Used  Substance and Sexual Activity  . Alcohol use: No  . Drug use: No  . Sexual activity: Not Currently  Other Topics Concern  . Not on file  Social History Narrative   Patient drinks 1 cup of coffee daily.   Patient is right handed.    Social Determinants of Health   Financial Resource Strain: Low Risk   . Difficulty of Paying Living Expenses: Not hard at all  Food Insecurity: No Food Insecurity  . Worried About Charity fundraiser in the Last Year: Never true  . Ran Out of Food in the Last Year: Never true  Transportation Needs: No Transportation Needs  . Lack of Transportation (Medical): No  . Lack of Transportation (Non-Medical): No  Physical Activity: Sufficiently Active  . Days of Exercise per Week: 7 days  . Minutes of Exercise per Session: 30 min  Stress: No Stress Concern Present  . Feeling of Stress : Not at all  Social Connections: Not Isolated  . Frequency of Communication with Friends and Family: More than three times a week  . Frequency of Social Gatherings with Friends and Family: More than three times a week  . Attends Religious Services: More than 4 times per year  . Active Member of Clubs or Organizations: Yes  . Attends Archivist Meetings: More than 4 times per year  . Marital Status: Married  Human resources officer Violence: Not At Risk  . Fear of Current or Ex-Partner: No  . Emotionally Abused: No  . Physically Abused: No  . Sexually Abused: No    Outpatient Encounter  Medications as of 10/07/2019  Medication Sig  . amLODipine (NORVASC) 5 MG tablet Take 1 tablet (5 mg total) by mouth daily. (Patient taking differently: Take 10 mg by mouth daily. )  . atorvastatin (LIPITOR) 10 MG tablet Take 1 tablet by mouth once daily  . blood glucose meter kit and supplies KIT Dispense based on patient and insurance preference. Use up to four times daily as directed. (FOR ICD-9 250.00, 250.01).  . cholecalciferol (VITAMIN D3) 25 MCG (1000 UT) tablet Take 1,000 Units by mouth daily.  . fluticasone (FLONASE) 50 MCG/ACT nasal spray Place 2 sprays into both nostrils daily.  . furosemide (LASIX) 20 MG tablet Take 1.5 tablets (30 mg total) by mouth daily as needed. (Patient taking differently: Take 20 mg by mouth daily. )  . glucose blood (ONE TOUCH ULTRA TEST) test strip Test up to tid. DX E11.9  .  metFORMIN (GLUCOPHAGE) 500 MG tablet TAKE ONE-HALF TABLET BY  MOUTH DAILY WITH BREAKFAST  . olmesartan (BENICAR) 40 MG tablet Take 1 tablet (40 mg total) by mouth daily.  Alveda Reasons 20 MG TABS tablet TAKE 1 TABLET BY MOUTH ONCE DAILY WITH FOOD   No facility-administered encounter medications on file as of 10/07/2019.    No Known Allergies  Review of Systems  Constitutional: Negative for activity change, appetite change, chills, diaphoresis, fatigue, fever and unexpected weight change.  HENT: Negative.   Eyes: Positive for visual disturbance.  Respiratory: Negative for cough, chest tightness and shortness of breath.   Cardiovascular: Negative for chest pain, palpitations and leg swelling.  Gastrointestinal: Negative for abdominal pain, blood in stool, constipation, diarrhea, nausea and vomiting.  Endocrine: Negative.  Negative for cold intolerance, heat intolerance, polydipsia, polyphagia and polyuria.  Genitourinary: Negative for decreased urine volume, difficulty urinating, dysuria, frequency and urgency.  Musculoskeletal: Negative for arthralgias and myalgias.  Skin: Negative.     Allergic/Immunologic: Negative.   Neurological: Negative for dizziness, tremors, seizures, syncope, facial asymmetry, speech difficulty, weakness, light-headedness, numbness and headaches.  Hematological: Negative.   Psychiatric/Behavioral: Negative for confusion, hallucinations, sleep disturbance and suicidal ideas.  All other systems reviewed and are negative.       Objective:  BP 126/79   Pulse 93   Temp 98.9 F (37.2 C)   Resp 20   Ht '5\' 7"'  (1.702 m)   Wt 229 lb (103.9 kg)   SpO2 100%   BMI 35.87 kg/m    Wt Readings from Last 3 Encounters:  10/07/19 229 lb (103.9 kg)  07/03/19 229 lb (103.9 kg)  03/15/19 228 lb (103.4 kg)    Physical Exam Vitals and nursing note reviewed.  Constitutional:      General: He is not in acute distress.    Appearance: Normal appearance. He is well-developed and well-groomed. He is obese. He is not ill-appearing, toxic-appearing or diaphoretic.  HENT:     Head: Normocephalic and atraumatic.     Jaw: There is normal jaw occlusion.     Right Ear: Hearing normal.     Left Ear: Hearing normal.     Nose: Nose normal.     Mouth/Throat:     Lips: Pink.     Mouth: Mucous membranes are moist.     Pharynx: Oropharynx is clear. Uvula midline.  Eyes:     General: Lids are normal.     Extraocular Movements: Extraocular movements intact.     Conjunctiva/sclera: Conjunctivae normal.     Pupils: Pupils are equal, round, and reactive to light.  Neck:     Thyroid: No thyroid mass, thyromegaly or thyroid tenderness.     Vascular: No carotid bruit or JVD.     Trachea: Trachea and phonation normal.  Cardiovascular:     Rate and Rhythm: Normal rate and regular rhythm.     Chest Wall: PMI is not displaced.     Pulses: Normal pulses.     Heart sounds: Normal heart sounds. No murmur. No friction rub. No gallop.   Pulmonary:     Effort: Pulmonary effort is normal. No respiratory distress.     Breath sounds: Normal breath sounds. No wheezing.   Abdominal:     General: Bowel sounds are normal. There is no distension or abdominal bruit.     Palpations: Abdomen is soft. There is no hepatomegaly or splenomegaly.     Tenderness: There is no abdominal tenderness. There is no right CVA tenderness  or left CVA tenderness.     Hernia: No hernia is present.  Musculoskeletal:        General: Normal range of motion.     Cervical back: Normal range of motion and neck supple.     Right lower leg: No edema.     Left lower leg: No edema.  Lymphadenopathy:     Cervical: No cervical adenopathy.  Skin:    General: Skin is warm and dry.     Capillary Refill: Capillary refill takes less than 2 seconds.     Coloration: Skin is not cyanotic, jaundiced or pale.     Findings: No rash.  Neurological:     General: No focal deficit present.     Mental Status: He is alert and oriented to person, place, and time.     Cranial Nerves: Cranial nerves are intact. No cranial nerve deficit.     Sensory: Sensation is intact. No sensory deficit.     Motor: Motor function is intact. No weakness.     Coordination: Coordination is intact. Coordination normal.     Gait: Gait is intact. Gait normal.     Deep Tendon Reflexes: Reflexes are normal and symmetric. Reflexes normal.  Psychiatric:        Attention and Perception: Attention and perception normal.        Mood and Affect: Mood and affect normal.        Speech: Speech normal.        Behavior: Behavior normal. Behavior is cooperative.        Thought Content: Thought content normal.        Cognition and Memory: Cognition and memory normal.        Judgment: Judgment normal.     Results for orders placed or performed in visit on 07/10/19  HM DIABETES EYE EXAM  Result Value Ref Range   HM Diabetic Eye Exam No Retinopathy No Retinopathy       Pertinent labs & imaging results that were available during my care of the patient were reviewed by me and considered in my medical decision making.  Assessment &  Plan:  Ricardo Mayer was seen today for medical management of chronic issues, diabetes, hypertension and hyperlipidemia.  Diagnoses and all orders for this visit:  Type 2 diabetes mellitus with vascular disease ([place]) A1C 5.8 today.  No changes in regimen. Is following with eye doctor for recent visual changes. Dr. Baird Cancer is concerned about retinal hemorrhage. Will hold Xarelto therapy for now. Pt aware of signs and symptoms that need to be reported immediately.  -     CBC with Differential/Platelet -     Bayer DCA Hb A1c Waived  Hyperlipidemia associated with type 2 diabetes mellitus ([place]) Diet encouraged - increase intake of fresh fruits and vegetables, increase intake of lean proteins. Bake, broil, or grill foods. Avoid fried, greasy, and fatty foods. Avoid fast foods. Increase intake of fiber-rich whole grains. Exercise encouraged - at least 150 minutes per week and advance as tolerated.  Goal BMI < 25. Continue medications as prescribed. Follow up in 3-6 months as discussed.  -     CBC with Differential/Platelet -     Lipid panel  Hypertension associated with type 2 diabetes mellitus (HCC) BP well controlled. Changes were not made in regimen today. Goal BP is 130/80. Pt aware to report any persistent high or low readings. DASH diet and exercise encouraged. Exercise at least 150 minutes per week and increase as tolerated. Goal BMI >  25. Stress management encouraged. Avoid nicotine and tobacco product use. Avoid excessive alcohol and NSAID's. Avoid more than 2000 mg of sodium daily. Medications as prescribed. Follow up as scheduled.  -     CBC with Differential/Platelet -     CMP14+EGFR  Chronic kidney disease (CKD), stage IV (severe) (HCC) Stable. Labs pending.  -     CBC with Differential/Platelet -     CMP14+EGFR  Vitamin D deficiency Labs pending. Continue repletion therapy. If indicated, will change repletion dosage. Eat foods rich in Vit D including milk, orange juice, yogurt with  vitamin D added, salmon or mackerel, canned tuna fish, cereals with vitamin D added, and cod liver oil. Get out in the sun but make sure to wear at least SPF 30 sunscreen.  -     CBC with Differential/Platelet    Discussed case with Dr. Livia Snellen and he agrees with holding Xarelto at this time.   Continue all other maintenance medications.  Follow up plan: Return in about 3 months (around 01/04/2020), or if symptoms worsen or fail to improve, for DM.  Continue healthy lifestyle choices, including diet (rich in fruits, vegetables, and lean proteins, and low in salt and simple carbohydrates) and exercise (at least 30 minutes of moderate physical activity daily).  Educational handout given for DM  The above assessment and management plan was discussed with the patient. The patient verbalized understanding of and has agreed to the management plan. Patient is aware to call the clinic if they develop any new symptoms or if symptoms persist or worsen. Patient is aware when to return to the clinic for a follow-up visit. Patient educated on when it is appropriate to go to the emergency department.   Monia Pouch, FNP-C [facility] [phone]

## 2019-10-07 NOTE — Patient Instructions (Signed)

## 2019-10-07 NOTE — Telephone Encounter (Signed)
Spoke with pt aware

## 2019-10-07 NOTE — Telephone Encounter (Signed)
After pt left appt with [name] today = she reviewed chart and agreed to let pt come off of Xorelto for a trial period.  He is to watch for signs of calf swelling/redness/pain, watch for palpitations  Per [name] he does not have to do ASA therapy at this time or at least until he sees Dr Baird Cancer.    NA at pt # to discuss this.  Will continue to try and reach pt.

## 2019-10-08 LAB — CBC WITH DIFFERENTIAL/PLATELET
Basophils Absolute: 0.1 10*3/uL (ref 0.0–0.2)
Basos: 1 %
EOS (ABSOLUTE): 0.1 10*3/uL (ref 0.0–0.4)
Eos: 1 %
Hematocrit: 41.2 % (ref 37.5–51.0)
Hemoglobin: 13.9 g/dL (ref 13.0–17.7)
Immature Grans (Abs): 0 10*3/uL (ref 0.0–0.1)
Immature Granulocytes: 0 %
Lymphocytes Absolute: 3.1 10*3/uL (ref 0.7–3.1)
Lymphs: 31 %
MCH: 30.1 pg (ref 26.6–33.0)
MCHC: 33.7 g/dL (ref 31.5–35.7)
MCV: 89 fL (ref 79–97)
Monocytes Absolute: 0.8 10*3/uL (ref 0.1–0.9)
Monocytes: 8 %
Neutrophils Absolute: 6.1 10*3/uL (ref 1.4–7.0)
Neutrophils: 59 %
Platelets: 177 10*3/uL (ref 150–450)
RBC: 4.62 x10E6/uL (ref 4.14–5.80)
RDW: 13.9 % (ref 11.6–15.4)
WBC: 10.3 10*3/uL (ref 3.4–10.8)

## 2019-10-08 LAB — CMP14+EGFR
ALT: 15 IU/L (ref 0–44)
AST: 21 IU/L (ref 0–40)
Albumin/Globulin Ratio: 2.3 — ABNORMAL HIGH (ref 1.2–2.2)
Albumin: 4.3 g/dL (ref 3.7–4.7)
Alkaline Phosphatase: 80 IU/L (ref 39–117)
BUN/Creatinine Ratio: 18 (ref 10–24)
BUN: 40 mg/dL — ABNORMAL HIGH (ref 8–27)
Bilirubin Total: 0.3 mg/dL (ref 0.0–1.2)
CO2: 18 mmol/L — ABNORMAL LOW (ref 20–29)
Calcium: 9.5 mg/dL (ref 8.6–10.2)
Chloride: 105 mmol/L (ref 96–106)
Creatinine, Ser: 2.2 mg/dL — ABNORMAL HIGH (ref 0.76–1.27)
GFR calc Af Amer: 32 mL/min/{1.73_m2} — ABNORMAL LOW (ref >59)
GFR calc non Af Amer: 28 mL/min/{1.73_m2} — ABNORMAL LOW (ref >59)
Globulin, Total: 1.9 g/dL (ref 1.5–4.5)
Glucose: 114 mg/dL — ABNORMAL HIGH (ref 65–99)
Potassium: 5.2 mmol/L (ref 3.5–5.2)
Sodium: 139 mmol/L (ref 134–144)
Total Protein: 6.2 g/dL (ref 6.0–8.5)

## 2019-10-08 LAB — LIPID PANEL
Chol/HDL Ratio: 2.1 ratio (ref 0.0–5.0)
Cholesterol, Total: 101 mg/dL (ref 100–199)
HDL: 48 mg/dL (ref >39)
LDL Chol Calc (NIH): 36 mg/dL (ref 0–99)
Triglycerides: 86 mg/dL (ref 0–149)
VLDL Cholesterol Cal: 17 mg/dL (ref 5–40)

## 2019-10-14 DIAGNOSIS — H4312 Vitreous hemorrhage, left eye: Secondary | ICD-10-CM | POA: Diagnosis not present

## 2019-10-14 DIAGNOSIS — H3562 Retinal hemorrhage, left eye: Secondary | ICD-10-CM | POA: Diagnosis not present

## 2019-10-15 ENCOUNTER — Telehealth: Payer: Self-pay | Admitting: Family Medicine

## 2019-10-15 DIAGNOSIS — H3562 Retinal hemorrhage, left eye: Secondary | ICD-10-CM | POA: Diagnosis not present

## 2019-10-15 NOTE — Telephone Encounter (Signed)
Pt wife called today and states that he had his eye surgery yesterday ( bleed- successful)  and then followed back up with Dr Tye Savoy this AM. He recommends he stay off blood thinner for the remainder of this week. He stated that it would be up to PCP to decide if he could stay off of it, or go back on. Tye Savoy was concerned that it could cause more eye issues in the future, but then they are also concerned that DVT / PE could happen again. They were told that this med was a "forever med".  Wife feels that he needs it, but maybe a lower dose.  What are your thoughts?

## 2019-10-16 NOTE — Telephone Encounter (Signed)
Patient aware and verbalized understanding. °

## 2019-10-16 NOTE — Telephone Encounter (Signed)
I would not recommend coming off of long term due to comorbidities. We can reevaluate if eye problem reoccurs.

## 2019-10-16 NOTE — Telephone Encounter (Signed)
It will be ok to remain off of for the week.

## 2019-10-19 ENCOUNTER — Other Ambulatory Visit: Payer: Self-pay | Admitting: Family Medicine

## 2019-10-19 DIAGNOSIS — I10 Essential (primary) hypertension: Secondary | ICD-10-CM

## 2019-10-22 DIAGNOSIS — H353221 Exudative age-related macular degeneration, left eye, with active choroidal neovascularization: Secondary | ICD-10-CM | POA: Diagnosis not present

## 2019-10-23 ENCOUNTER — Telehealth: Payer: Self-pay | Admitting: Family Medicine

## 2019-10-23 NOTE — Chronic Care Management (AMB) (Signed)
  Chronic Care Management   Note  10/23/2019 Name: Ricardo Mayer. MRN: [id number] DOB: 1941/08/12  [name]. is a 79 y.o. year old male who is a primary care patient of [name], Connye Burkitt, FNP. I reached out to [name]. by phone today in response to a referral sent by Mr. TRAJAN GROVE Jr.'s health plan.     Mr. Schwer was given information about Chronic Care Management services today including:  1. CCM service includes personalized support from designated clinical staff supervised by his physician, including individualized plan of care and coordination with other care providers 2. 24/7 contact phone numbers for assistance for urgent and routine care needs. 3. Service will only be billed when office clinical staff spend 20 minutes or more in a month to coordinate care. 4. Only one practitioner may furnish and bill the service in a calendar month. 5. The patient may stop CCM services at any time (effective at the end of the month) by phone call to the office staff. 6. The patient will be responsible for cost sharing (co-pay) of up to 20% of the service fee (after annual deductible is met).  Patient agreed to services and verbal consent obtained.   Follow up plan: Telephone appointment with care management team member scheduled for:01/15/2020  Noreene Larsson, [address], [place], [place] [address] Direct Dial: [phone] [email or web address] Website: [email or web address]

## 2019-10-24 ENCOUNTER — Telehealth: Payer: Self-pay | Admitting: Family Medicine

## 2019-10-24 NOTE — Telephone Encounter (Signed)
Please give michelle a message from dr Corene Cornea sanders to contact him at [phone] or [phone]

## 2019-10-25 NOTE — Telephone Encounter (Signed)
Spoke with Dr. Baird Cancer pertaining to pt. Pt should remain off of Xarelto and start ASA 325 mg daily. The risk of restarting the Xarelto outweighs the benefits.

## 2019-10-25 NOTE — Telephone Encounter (Signed)
Patient aware and verbalizes understanding. 

## 2019-11-06 ENCOUNTER — Other Ambulatory Visit: Payer: Self-pay

## 2019-11-06 ENCOUNTER — Other Ambulatory Visit: Payer: Medicare Other

## 2019-11-06 DIAGNOSIS — N2581 Secondary hyperparathyroidism of renal origin: Secondary | ICD-10-CM | POA: Diagnosis not present

## 2019-11-06 DIAGNOSIS — N189 Chronic kidney disease, unspecified: Secondary | ICD-10-CM | POA: Diagnosis not present

## 2019-11-06 DIAGNOSIS — N184 Chronic kidney disease, stage 4 (severe): Secondary | ICD-10-CM | POA: Diagnosis not present

## 2019-11-08 DIAGNOSIS — H4312 Vitreous hemorrhage, left eye: Secondary | ICD-10-CM | POA: Diagnosis not present

## 2019-11-08 DIAGNOSIS — H353221 Exudative age-related macular degeneration, left eye, with active choroidal neovascularization: Secondary | ICD-10-CM | POA: Diagnosis not present

## 2019-11-08 DIAGNOSIS — H353111 Nonexudative age-related macular degeneration, right eye, early dry stage: Secondary | ICD-10-CM | POA: Diagnosis not present

## 2019-11-14 DIAGNOSIS — D631 Anemia in chronic kidney disease: Secondary | ICD-10-CM | POA: Diagnosis not present

## 2019-11-14 DIAGNOSIS — I129 Hypertensive chronic kidney disease with stage 1 through stage 4 chronic kidney disease, or unspecified chronic kidney disease: Secondary | ICD-10-CM | POA: Diagnosis not present

## 2019-11-14 DIAGNOSIS — N184 Chronic kidney disease, stage 4 (severe): Secondary | ICD-10-CM | POA: Diagnosis not present

## 2019-11-14 DIAGNOSIS — N2581 Secondary hyperparathyroidism of renal origin: Secondary | ICD-10-CM | POA: Diagnosis not present

## 2019-11-21 ENCOUNTER — Other Ambulatory Visit: Payer: Self-pay | Admitting: *Deleted

## 2020-01-06 ENCOUNTER — Ambulatory Visit: Payer: Medicare Other | Admitting: Family Medicine

## 2020-01-08 ENCOUNTER — Ambulatory Visit: Payer: Medicare Other | Admitting: Family Medicine

## 2020-01-08 DIAGNOSIS — H353221 Exudative age-related macular degeneration, left eye, with active choroidal neovascularization: Secondary | ICD-10-CM | POA: Diagnosis not present

## 2020-01-08 DIAGNOSIS — H353111 Nonexudative age-related macular degeneration, right eye, early dry stage: Secondary | ICD-10-CM | POA: Diagnosis not present

## 2020-01-09 NOTE — Progress Notes (Signed)
Subjective: CC: est care, DM PCP: [name], DO JWJ:XBJYN D Britian Jentz. is a 79 y.o. male presenting to clinic today for:  1. Type 2 Diabetes w/ HTN/ HLD, CKD4/DVT/PE:  Patient reports compliance with his medications.  He is followed very closely by Dr. Posey Pronto every 6 months.  He is on Metformin 750 mg daily with breakfast, Lasix, amlodipine, Lipitor and Benicar.  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  He was on Xarelto previously but unfortunately developed a retinal hemorrhage and this was discontinued.  He is followed very closely by his retinal doctor, Dr. Tye Savoy with last checkup on Wednesday and next checkup in November.  Last eye exam: Up-to-date.  Sees my eye doctor.  Recently seen by retinal doctor Dr. Tye Savoy in [place]. Last foot exam: UTD Last A1c:  Lab Results  Component Value Date   HGBA1C 5.8 10/07/2019   Nephropathy screen indicated?: n.a Last flu, zoster and/or pneumovax:  Immunization History  Administered Date(s) Administered  . Fluad Quad(high Dose 65+) 05/16/2019  . Influenza Whole 05/20/2016  . Influenza-Unspecified 05/15/2013, 05/15/2014, 06/09/2015  . Moderna SARS-COVID-2 Vaccination 08/27/2019, 10/07/2019  . Pneumococcal Conjugate-13 10/09/2013  . Pneumococcal Polysaccharide-23 07/05/2018  . Tdap 04/07/2011    ROS: No chest pain, shortness of breath, change in exercise tolerance.  He has chronic mild lower extremity edema with associated skin changes.  No history of ulcer hospitalization for diabetes.  Vision is stable.  2.  Skin tears Patient reports 2 skin tears on the right forearm.  He had some old Bactroban cream but needs a renewal on this.  He is as needed.  Denies any fevers or purulence.   ROS: Per HPI  No Known Allergies Past Medical History:  Diagnosis Date  . Adenomatous polyp of colon   . Arthritis    bilateral  knees  . CKD (chronic kidney disease), stage II    Dr. Patel,nephrology "stable last 3 yrs"  . 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 Diabetes mellitus without complication ([place])    Dr. Laurance Flatten.PCP  . History of basal cell cancer   . History of DVT of lower extremity    2011--  LEFT LEG  . History of kidney stones   . History of melanoma excision    RIGHT ARM  . Hyperlipidemia   . Hypertension   . Impaired hearing    hearing aids bilateral  . Melanoma ([place]) 1990   arm and back -Melanoma, basal cell others- Dr. Edgardo Roys follows.  . Near syncope 08/25/2015   Dr. Roland Rack once"found blood clots in lung"  . Nocturia   . Prostate cancer [facility]) 2014   Dr. Cherylann Parr- "seed implant" '14  . Ulcer of left lower leg ([place])    recent pcp visit dr Laurance Flatten--  ordered betadine wet/dry dsg daily (03-18-2013)  [place] IN 1 WEEK    Current Outpatient Medications:  .  amLODipine (NORVASC) 5 MG tablet, Take 1 tablet (5 mg total) by mouth daily. (Patient taking differently: Take 10 mg by mouth daily. ), Disp: 90 tablet, Rfl: 3 .  aspirin 325 MG tablet, Take 325 mg by mouth daily., Disp: , Rfl:  .  atorvastatin (LIPITOR) 10 MG tablet, TAKE 1 TABLET BY MOUTH  DAILY, Disp: 90 tablet, Rfl: 0 .  blood glucose meter kit  and supplies KIT, Dispense based on patient and insurance preference. Use up to four times daily as directed. (FOR ICD-9 250.00, 250.01)., Disp: 1 each, Rfl: 0 .  cholecalciferol (VITAMIN D3) 25 MCG (1000 UT) tablet, Take 1,000 Units by mouth daily., Disp: , Rfl:  .  fluticasone (FLONASE) 50 MCG/ACT nasal spray, Place 2 sprays into both nostrils daily., Disp: 16 g, Rfl: 6 .  furosemide (LASIX) 20 MG tablet, TAKE 1 AND 1/2 TABLETS BY  MOUTH DAILY AS NEEDED, Disp: 135 tablet, Rfl: 0 .  glucose blood (ONE TOUCH ULTRA TEST) test strip, Test up to tid. DX E11.9, Disp: 100 each, Rfl: 2 .  metFORMIN (GLUCOPHAGE) 500 MG tablet, TAKE ONE-HALF TABLET BY   MOUTH DAILY WITH BREAKFAST, Disp: 45 tablet, Rfl: 3 .  olmesartan (BENICAR) 40 MG tablet, TAKE 1 TABLET BY MOUTH  DAILY, Disp: 90 tablet, Rfl: 0 .  mupirocin ointment (BACTROBAN) 2 %, Apply 1 application topically 2 (two) times daily for 5 days., Disp: 22 g, Rfl: 0 Social History   Socioeconomic History  . Marital status: Married    Spouse name: Janelle  . Number of children: 3  . Years of education: master's  . Highest education level: Master's degree (e.g., MA, MS, MEng, MEd, MSW, MBA)  Occupational History  . Occupation: Retail buyer    Comment: retired  Tobacco Use  . Smoking status: Never Smoker  . Smokeless tobacco: Never Used  Substance and Sexual Activity  . Alcohol use: No  . Drug use: No  . Sexual activity: Not Currently  Other Topics Concern  . Not on file  Social History Narrative   Patient drinks 1 cup of coffee daily.   Patient is right handed.    Patient resides locally with his wife.  He has 3 sons, 2 sons reside in [place] where the other one resides in [place].  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   Social Determinants of Health   Financial Resource Strain: Low Risk   . Difficulty of Paying Living Expenses: Not hard at all  Food Insecurity: No Food Insecurity  . Worried About Charity fundraiser in the Last Year: Never true  . Ran Out of Food in the Last Year: Never true  Transportation Needs: No Transportation Needs  . Lack of Transportation (Medical): No  . Lack of Transportation (Non-Medical): No  Physical Activity: Sufficiently Active  . Days of Exercise per Week: 7 days  . Minutes of Exercise per Session: 30 min  Stress: No Stress Concern Present  . Feeling of Stress : Not at all  Social Connections: Not Isolated  . Frequency of Communication with Friends and Family: More than three times a  week  . Frequency of Social Gatherings with Friends and Family: More than three times a week  . Attends Religious Services: More than 4 times per year  . Active Member of Clubs or Organizations: Yes  . Attends Archivist Meetings: More than 4 times per year  . Marital Status: Married  Human resources officer Violence: Not At Risk  . Fear of Current or Ex-Partner: No  . Emotionally Abused: No  . Physically Abused: No  . Sexually Abused: No   Family History  Problem Relation Age of Onset  . Breast cancer Mother   . Stroke Mother   . Hypertension  Mother   . Lung cancer Father        smoked  . Ulcers Father   . Heart disease Brother 42       stent  . Esophageal cancer Brother   . Cancer Paternal Uncle        prostate  . Cancer Paternal Grandfather        prostate  . Heart attack Neg Hx   . Colon cancer Neg Hx   . Colon polyps Neg Hx   . Rectal cancer Neg Hx   . Stomach cancer Neg Hx     Objective: Office vital signs reviewed. BP 125/67   Pulse 84   Temp (!) 97.5 F (36.4 C)   Ht '5\' 7"'  (1.702 m)   Wt 233 lb 6.4 oz (105.9 kg)   SpO2 97%   BMI 36.56 kg/m   Physical Examination:  General: Awake, alert, well nourished, well appearing elderly male. No acute distress HEENT: Normal. Sclera white, MMM Cardio: regular rate and rhythm, S1S2 heard, no murmurs appreciated Pulm: clear to auscultation bilaterally, no wheezes, rhonchi or rales; normal work of breathing on room air Extremities: warm, well perfused, trace edema to lower shins, No cyanosis or clubbing; +2 pulses bilaterally MSK: slow gait and slightly hunched station Skin: dry; 2, hemostatic, superficial skin tears noted along the right dorsum of the forearm.  No evidence of secondary infection.  Assessment/ Plan: 79 y.o. male   1. Type 2 diabetes mellitus with vascular disease ([place]) Under excellent control with A1c of 6.0.  I did have some concern about patient being on Metformin at current GFR.  I did reach  out to his nephrologist.  His nephrologist was comfortable with him continuing his medications at current level.  He is being monitored very closely. - Bayer DCA Hb A1c Waived  2. Hypertension associated with type 2 diabetes mellitus ([place]) Controlled.  Continue current regimen - CMP14+EGFR  3. Hyperlipidemia associated with type 2 diabetes mellitus ([place]) Continue statin  4. Chronic kidney disease (CKD), stage IV (severe) (HCC) We will collect renal function panel and magnesium per nephrologist request.  Unfortunately patient had already left and urine sample was unable to be obtained so this will need to be collected by his nephrologist at his next visit. - Renal Function Panel - Magnesium  5. Skin tear of right upper arm without complication, initial encounter No evidence of secondary infection but I have given him Bactroban to continue applying as he has already been applying this. - mupirocin ointment (BACTROBAN) 2 %; Apply 1 application topically 2 (two) times daily for 5 days.  Dispense: 22 g; Refill: 0  6. History of deep vein thrombosis (DVT) of lower extremity Currently only using aspirin 325 for anticoagulation despite recurrent DVT.  Unfortunately he had a retinal hemorrhage so the risk to the medications at this point outweigh the benefits.  Patient is very aware of signs and symptoms of DVT and PE and he will seek immediate medical attention should any of these occur.  7. History of pulmonary embolus (PE)  8. History of prostate cancer Followed by Dr. Jeffie Pollock.  Status post seed placement.   Orders Placed This Encounter  Procedures  . Bayer DCA Hb A1c Waived  . CMP14+EGFR  . Renal Function Panel    Order Specific Question:   CC Results    Answer:   PATEL, JAY [[id number]]  . Magnesium    Order Specific Question:   CC Results    Answer:  PATEL, JAY [[id number]]   Meds ordered this encounter  Medications  . mupirocin ointment (BACTROBAN) 2 %    Sig: Apply 1 application topically  2 (two) times daily for 5 days.    Dispense:  22 g    Refill:  0   He may follow-up in 4 months, sooner if needed  [name], [place] [phone]

## 2020-01-10 ENCOUNTER — Other Ambulatory Visit: Payer: Self-pay

## 2020-01-10 ENCOUNTER — Encounter: Payer: Self-pay | Admitting: Family Medicine

## 2020-01-10 ENCOUNTER — Ambulatory Visit (INDEPENDENT_AMBULATORY_CARE_PROVIDER_SITE_OTHER): Payer: Medicare Other | Admitting: Family Medicine

## 2020-01-10 VITALS — BP 125/67 | HR 84 | Temp 97.5°F | Ht 67.0 in | Wt 233.4 lb

## 2020-01-10 DIAGNOSIS — Z86718 Personal history of other venous thrombosis and embolism: Secondary | ICD-10-CM | POA: Diagnosis not present

## 2020-01-10 DIAGNOSIS — E785 Hyperlipidemia, unspecified: Secondary | ICD-10-CM

## 2020-01-10 DIAGNOSIS — S41111A Laceration without foreign body of right upper arm, initial encounter: Secondary | ICD-10-CM

## 2020-01-10 DIAGNOSIS — E1169 Type 2 diabetes mellitus with other specified complication: Secondary | ICD-10-CM | POA: Diagnosis not present

## 2020-01-10 DIAGNOSIS — I10 Essential (primary) hypertension: Secondary | ICD-10-CM

## 2020-01-10 DIAGNOSIS — I2699 Other pulmonary embolism without acute cor pulmonale: Secondary | ICD-10-CM

## 2020-01-10 DIAGNOSIS — N184 Chronic kidney disease, stage 4 (severe): Secondary | ICD-10-CM | POA: Diagnosis not present

## 2020-01-10 DIAGNOSIS — E1159 Type 2 diabetes mellitus with other circulatory complications: Secondary | ICD-10-CM

## 2020-01-10 DIAGNOSIS — Z86711 Personal history of pulmonary embolism: Secondary | ICD-10-CM

## 2020-01-10 DIAGNOSIS — Z8546 Personal history of malignant neoplasm of prostate: Secondary | ICD-10-CM

## 2020-01-10 LAB — BAYER DCA HB A1C WAIVED: HB A1C (BAYER DCA - WAIVED): 6 % (ref <7.0)

## 2020-01-10 MED ORDER — FUROSEMIDE 20 MG PO TABS: ORAL_TABLET | ORAL | 0 refills | Status: DC

## 2020-01-10 MED ORDER — MUPIROCIN 2 % EX OINT: 1.0000 "application " | TOPICAL_OINTMENT | Freq: Two times a day (BID) | CUTANEOUS | 0 refills | Status: AC

## 2020-01-10 MED ORDER — ATORVASTATIN CALCIUM 10 MG PO TABS: 10.0000 mg | ORAL_TABLET | Freq: Every day | ORAL | 0 refills | Status: DC

## 2020-01-10 MED ORDER — OLMESARTAN MEDOXOMIL 40 MG PO TABS: 40.0000 mg | ORAL_TABLET | Freq: Every day | ORAL | 0 refills | Status: DC

## 2020-01-11 LAB — RENAL FUNCTION PANEL
Albumin: 4.1 g/dL (ref 3.7–4.7)
BUN/Creatinine Ratio: 19 (ref 10–24)
BUN: 38 mg/dL — ABNORMAL HIGH (ref 8–27)
CO2: 16 mmol/L — ABNORMAL LOW (ref 20–29)
Calcium: 9.2 mg/dL (ref 8.6–10.2)
Chloride: 106 mmol/L (ref 96–106)
Creatinine, Ser: 2.05 mg/dL — ABNORMAL HIGH (ref 0.76–1.27)
GFR calc Af Amer: 35 mL/min/{1.73_m2} — ABNORMAL LOW (ref >59)
GFR calc non Af Amer: 30 mL/min/{1.73_m2} — ABNORMAL LOW (ref >59)
Glucose: 106 mg/dL — ABNORMAL HIGH (ref 65–99)
Phosphorus: 3.7 mg/dL (ref 2.8–4.1)
Potassium: 4.7 mmol/L (ref 3.5–5.2)
Sodium: 139 mmol/L (ref 134–144)

## 2020-01-11 LAB — MAGNESIUM: Magnesium: 2 mg/dL (ref 1.6–2.3)

## 2020-01-15 ENCOUNTER — Ambulatory Visit (INDEPENDENT_AMBULATORY_CARE_PROVIDER_SITE_OTHER): Payer: Medicare Other | Admitting: *Deleted

## 2020-01-15 DIAGNOSIS — E1169 Type 2 diabetes mellitus with other specified complication: Secondary | ICD-10-CM | POA: Diagnosis not present

## 2020-01-15 DIAGNOSIS — E785 Hyperlipidemia, unspecified: Secondary | ICD-10-CM | POA: Diagnosis not present

## 2020-01-15 DIAGNOSIS — I10 Essential (primary) hypertension: Secondary | ICD-10-CM | POA: Diagnosis not present

## 2020-01-15 DIAGNOSIS — E1159 Type 2 diabetes mellitus with other circulatory complications: Secondary | ICD-10-CM

## 2020-01-15 DIAGNOSIS — N184 Chronic kidney disease, stage 4 (severe): Secondary | ICD-10-CM

## 2020-01-15 NOTE — Chronic Care Management (AMB) (Signed)
Chronic Care Management   Initial Visit Note  01/15/2020 Name: Ricardo Mayer. MRN: [id number] DOB: [birth year]  Referred by: Ricardo Norlander, DO Reason for referral : Chronic Care Management (Initial Visit)   Ricardo Mayer. is a 79 y.o. year old male who is a primary care patient of Ricardo Norlander, DO. The CCM team was consulted for assistance with chronic disease management and care coordination needs related to HTN, Peripheral vascular insufficiency, HLD, hyperparathyroidism, CKD, arthritis, anemia, hx of prostate CA.  Review of patient status, including review of consultants reports, relevant laboratory and other test results, and collaboration with appropriate care team members and the patient's provider was performed as part of comprehensive patient evaluation and provision of chronic care management services.    Subjetive: I spoke with Ricardo Mayer by telephone today regarding management of his chronic medical conditions. Per patient his medical conditions are well controlled and he doesn't feel that he needs any assistance at this time. He is agreeable to a follow-up CCM call in 3 months.   SDOH (Social Determinants of Health) assessments performed: Yes See Care Plan activities for detailed interventions related to SDOH  SDOH Interventions     Most Recent Value  SDOH Interventions  Physical Activity Interventions  Other (Comments) [Walking about 6000 steps a day. Phsyical actiivty is limited due to recent retinal bleed. He is being followed by opthalmologist.]       Objective: Outpatient Encounter Medications as of 01/15/2020  Medication Sig  . amLODipine (NORVASC) 5 MG tablet Take 1 tablet (5 mg total) by mouth daily. (Patient taking differently: Take 10 mg by mouth daily. )  . aspirin 325 MG tablet Take 325 mg by mouth daily.  Marland Kitchen atorvastatin (LIPITOR) 10 MG tablet Take 1 tablet (10 mg total) by mouth daily.  . blood glucose meter kit and supplies KIT Dispense based  on patient and insurance preference. Use up to four times daily as directed. (FOR ICD-9 250.00, 250.01).  . cholecalciferol (VITAMIN D3) 25 MCG (1000 UT) tablet Take 1,000 Units by mouth daily.  . fluticasone (FLONASE) 50 MCG/ACT nasal spray Place 2 sprays into both nostrils daily.  . furosemide (LASIX) 20 MG tablet TAKE 1 AND 1/2 TABLETS BY  MOUTH DAILY AS NEEDED  . glucose blood (ONE TOUCH ULTRA TEST) test strip Test up to tid. DX E11.9  . metFORMIN (GLUCOPHAGE) 500 MG tablet TAKE ONE-HALF TABLET BY  MOUTH DAILY WITH BREAKFAST  . mupirocin ointment (BACTROBAN) 2 % Apply 1 application topically 2 (two) times daily for 5 days.  Marland Kitchen olmesartan (BENICAR) 40 MG tablet Take 1 tablet (40 mg total) by mouth daily.   No facility-administered encounter medications on file as of 01/15/2020.     BP Readings from Last 3 Encounters:  01/10/20 125/67  10/07/19 126/79  07/03/19 123/75   Lab Results  Component Value Date   HGBA1C 6.0 01/10/2020   HGBA1C 5.8 10/07/2019   HGBA1C 5.8 07/03/2019   Lab Results  Component Value Date   LDLCALC 36 10/07/2019   CREATININE 2.05 (H) 01/10/2020   Lab Results  Component Value Date   CHOL 101 10/07/2019   HDL 48 10/07/2019   LDLCALC 36 10/07/2019   LDLDIRECT 21 03/05/2013   TRIG 86 10/07/2019   CHOLHDL 2.1 10/07/2019     RN Care Plan   . Chronic Disease Management Needs       CARE PLAN ENTRY (see longtitudinal plan of care for additional care plan information)  Current Barriers:  . Chronic Disease Management support, education, and care coordination needs related to HTN, Peripheral vascular insufficiency, HLD, hyperparathyroidism, CKD, arthritis, anemia, hx of prostate CA  Clinical Goal(s) related to HTN, Peripheral vascular insufficiency, HLD, hyperparathyroidism, CKD, arthritis, anemia, hx of prostate CA:  Over the next 90 days, patient will:  . Work with the care management team to address educational, disease management, and care coordination  needs  . Call provider office for new or worsened signs and symptoms . Call care management team with questions or concerns . Verbalize basic understanding of patient centered plan of care established today  Interventions related to HTN, Peripheral vascular insufficiency, HLD, hyperparathyroidism, CKD, arthritis, anemia, hx of prostate CA:  . Evaluation of current treatment plans and patient's adherence to plan as established by provider . Assessed patient understanding of disease states . Assessed patient's education and care coordination needs . Provided disease specific education to patient  . Collaborated with appropriate clinical care team members regarding patient needs . Provided with RNCM contact information and encouraged to reach out as needed  Patient Self Care Activities related to HTN, Peripheral vascular insufficiency, HLD, hyperparathyroidism, CKD, arthritis, anemia, hx of prostate CA:  . Patient is abe to independently perform ADLs and IADLs  Initial goal documentation          Plan:   The care management team will reach out to the patient again over the next 90 days.  Next PCP appointment scheduled for:   Ricardo Mayer, BSN, RN-BC [place] [facility] [place] [facility] Direct Dial: [phone]

## 2020-01-15 NOTE — Patient Instructions (Signed)
Visit Information  Goals Addressed            This Visit's Progress   . Chronic Disease Management Needs       CARE PLAN ENTRY (see longtitudinal plan of care for additional care plan information)  Current Barriers:  . Chronic Disease Management support, education, and care coordination needs related to HTN, Peripheral vascular insufficiency, HLD, hyperparathyroidism, CKD, arthritis, anemia, hx of prostate CA  Clinical Goal(s) related to HTN, Peripheral vascular insufficiency, HLD, hyperparathyroidism, CKD, arthritis, anemia, hx of prostate CA:  Over the next 90 days, patient will:  . Work with the care management team to address educational, disease management, and care coordination needs  . Call provider office for new or worsened signs and symptoms . Call care management team with questions or concerns . Verbalize basic understanding of patient centered plan of care established today  Interventions related to HTN, Peripheral vascular insufficiency, HLD, hyperparathyroidism, CKD, arthritis, anemia, hx of prostate CA:  . Evaluation of current treatment plans and patient's adherence to plan as established by provider . Assessed patient understanding of disease states . Assessed patient's education and care coordination needs . Provided disease specific education to patient  . Collaborated with appropriate clinical care team members regarding patient needs . Provided with RNCM contact information and encouraged to reach out as needed  Patient Self Care Activities related to HTN, Peripheral vascular insufficiency, HLD, hyperparathyroidism, CKD, arthritis, anemia, hx of prostate CA:  . Patient is abe to independently perform ADLs and IADLs  Initial goal documentation          Ricardo Mayer was given information about Chronic Care Management services today including:  1. CCM service includes personalized support from designated clinical staff supervised by his physician, including  individualized plan of care and coordination with other care providers 2. 24/7 contact phone numbers for assistance for urgent and routine care needs. 3. Service will only be billed when office clinical staff spend 20 minutes or more in a month to coordinate care. 4. Only one practitioner may furnish and bill the service in a calendar month. 5. The patient may stop CCM services at any time (effective at the end of the month) by phone call to the office staff. 6. The patient will be responsible for cost sharing (co-pay) of up to 20% of the service fee (after annual deductible is met).  Patient agreed to services and verbal consent obtained.   The patient verbalized understanding of instructions provided today and declined a print copy of patient instruction materials.   The care management team will reach out to the patient again over the next 90 days.   Chong Sicilian, BSN, RN-BC Embedded Chronic Care Manager [facility] Direct Dial: [phone]

## 2020-02-20 LAB — HM DIABETES EYE EXAM

## 2020-03-30 ENCOUNTER — Other Ambulatory Visit: Payer: Self-pay | Admitting: *Deleted

## 2020-03-30 ENCOUNTER — Other Ambulatory Visit: Payer: Medicare Other

## 2020-03-30 ENCOUNTER — Other Ambulatory Visit: Payer: Self-pay

## 2020-03-30 DIAGNOSIS — Z1159 Encounter for screening for other viral diseases: Secondary | ICD-10-CM | POA: Diagnosis not present

## 2020-03-30 DIAGNOSIS — N184 Chronic kidney disease, stage 4 (severe): Secondary | ICD-10-CM | POA: Diagnosis not present

## 2020-03-30 DIAGNOSIS — I10 Essential (primary) hypertension: Secondary | ICD-10-CM

## 2020-03-30 MED ORDER — AMLODIPINE BESYLATE 5 MG PO TABS: 5.0000 mg | ORAL_TABLET | Freq: Every day | ORAL | 0 refills | Status: DC

## 2020-03-31 LAB — HEPATITIS C ANTIBODY: Hep C Virus Ab: <0.1 s/co ratio (ref 0.0–0.9)

## 2020-04-01 DIAGNOSIS — H35373 Puckering of macula, bilateral: Secondary | ICD-10-CM | POA: Diagnosis not present

## 2020-04-01 DIAGNOSIS — H353221 Exudative age-related macular degeneration, left eye, with active choroidal neovascularization: Secondary | ICD-10-CM | POA: Diagnosis not present

## 2020-04-01 DIAGNOSIS — H3562 Retinal hemorrhage, left eye: Secondary | ICD-10-CM | POA: Diagnosis not present

## 2020-04-01 DIAGNOSIS — H353111 Nonexudative age-related macular degeneration, right eye, early dry stage: Secondary | ICD-10-CM | POA: Diagnosis not present

## 2020-04-02 ENCOUNTER — Other Ambulatory Visit: Payer: Self-pay | Admitting: Family Medicine

## 2020-04-02 DIAGNOSIS — I10 Essential (primary) hypertension: Secondary | ICD-10-CM

## 2020-04-08 DIAGNOSIS — N184 Chronic kidney disease, stage 4 (severe): Secondary | ICD-10-CM | POA: Diagnosis not present

## 2020-04-08 DIAGNOSIS — D631 Anemia in chronic kidney disease: Secondary | ICD-10-CM | POA: Diagnosis not present

## 2020-04-08 DIAGNOSIS — N2581 Secondary hyperparathyroidism of renal origin: Secondary | ICD-10-CM | POA: Diagnosis not present

## 2020-04-08 DIAGNOSIS — I129 Hypertensive chronic kidney disease with stage 1 through stage 4 chronic kidney disease, or unspecified chronic kidney disease: Secondary | ICD-10-CM | POA: Diagnosis not present

## 2020-05-13 ENCOUNTER — Other Ambulatory Visit: Payer: Self-pay

## 2020-05-13 ENCOUNTER — Encounter: Payer: Self-pay | Admitting: Family Medicine

## 2020-05-13 ENCOUNTER — Ambulatory Visit (INDEPENDENT_AMBULATORY_CARE_PROVIDER_SITE_OTHER): Payer: Medicare Other | Admitting: Family Medicine

## 2020-05-13 VITALS — BP 131/59 | HR 78 | Temp 97.3°F | Ht 67.0 in | Wt 226.8 lb

## 2020-05-13 DIAGNOSIS — D692 Other nonthrombocytopenic purpura: Secondary | ICD-10-CM

## 2020-05-13 DIAGNOSIS — E1159 Type 2 diabetes mellitus with other circulatory complications: Secondary | ICD-10-CM

## 2020-05-13 DIAGNOSIS — Z23 Encounter for immunization: Secondary | ICD-10-CM | POA: Diagnosis not present

## 2020-05-13 DIAGNOSIS — I152 Hypertension secondary to endocrine disorders: Secondary | ICD-10-CM

## 2020-05-13 DIAGNOSIS — Z86711 Personal history of pulmonary embolism: Secondary | ICD-10-CM | POA: Diagnosis not present

## 2020-05-13 DIAGNOSIS — E785 Hyperlipidemia, unspecified: Secondary | ICD-10-CM

## 2020-05-13 DIAGNOSIS — E1169 Type 2 diabetes mellitus with other specified complication: Secondary | ICD-10-CM

## 2020-05-13 DIAGNOSIS — Z86718 Personal history of other venous thrombosis and embolism: Secondary | ICD-10-CM

## 2020-05-13 DIAGNOSIS — I10 Essential (primary) hypertension: Secondary | ICD-10-CM | POA: Diagnosis not present

## 2020-05-13 DIAGNOSIS — N184 Chronic kidney disease, stage 4 (severe): Secondary | ICD-10-CM | POA: Diagnosis not present

## 2020-05-13 LAB — CBC
Hematocrit: 37.8 % (ref 37.5–51.0)
Hemoglobin: 13 g/dL (ref 13.0–17.7)
MCH: 31 pg (ref 26.6–33.0)
MCHC: 34.4 g/dL (ref 31.5–35.7)
MCV: 90 fL (ref 79–97)
Platelets: 140 10*3/uL — ABNORMAL LOW (ref 150–450)
RBC: 4.2 x10E6/uL (ref 4.14–5.80)
RDW: 13.7 % (ref 11.6–15.4)
WBC: 7.7 10*3/uL (ref 3.4–10.8)

## 2020-05-13 LAB — BASIC METABOLIC PANEL
BUN/Creatinine Ratio: 20 (ref 10–24)
BUN: 45 mg/dL — ABNORMAL HIGH (ref 8–27)
CO2: 27 mmol/L (ref 20–29)
Calcium: 9.4 mg/dL (ref 8.6–10.2)
Chloride: 99 mmol/L (ref 96–106)
Creatinine, Ser: 2.27 mg/dL — ABNORMAL HIGH (ref 0.76–1.27)
GFR calc Af Amer: 31 mL/min/{1.73_m2} — ABNORMAL LOW (ref >59)
GFR calc non Af Amer: 26 mL/min/{1.73_m2} — ABNORMAL LOW (ref >59)
Glucose: 104 mg/dL — ABNORMAL HIGH (ref 65–99)
Potassium: 4.5 mmol/L (ref 3.5–5.2)
Sodium: 144 mmol/L (ref 134–144)

## 2020-05-13 LAB — BAYER DCA HB A1C WAIVED: HB A1C (BAYER DCA - WAIVED): 5.8 % (ref <7.0)

## 2020-05-13 MED ORDER — OLMESARTAN MEDOXOMIL 40 MG PO TABS: 40.0000 mg | ORAL_TABLET | Freq: Every day | ORAL | 3 refills | Status: DC

## 2020-05-13 MED ORDER — ATORVASTATIN CALCIUM 10 MG PO TABS: 10.0000 mg | ORAL_TABLET | Freq: Every day | ORAL | 3 refills | Status: DC

## 2020-05-13 MED ORDER — AMLODIPINE BESYLATE 5 MG PO TABS: 5.0000 mg | ORAL_TABLET | Freq: Every day | ORAL | 3 refills | Status: DC

## 2020-05-13 MED ORDER — ASPIRIN 325 MG PO TABS: 325.0000 mg | ORAL_TABLET | Freq: Every day | ORAL | 3 refills | Status: AC

## 2020-05-13 NOTE — Progress Notes (Signed)
Subjective: CC: f/u DM, HTN, HLD PCP: [name], DO OLM:BEMLJ D Mahkai Fangman. is a 79 y.o. male presenting to clinic today for:  1. Type 2 Diabetes w/ HTN/ HLD, CKD4  Patient reports compliance with his medications.  He is on Metformin 250 mg daily with breakfast, Lasix, amlodipine, Lipitor and Benicar.   Last eye exam: Up-to-date.  Sees my eye doctor.  Recently seen by retinal doctor Dr. Tye Savoy in [place].  Last foot exam: UTD Last A1c:  Lab Results  Component Value Date   HGBA1C 6.0 01/10/2020   Nephropathy screen indicated?: on ARB Last flu, zoster and/or pneumovax:  Immunization History  Administered Date(s) Administered  . Fluad Quad(high Dose 65+) 05/16/2019  . Influenza Whole 05/20/2016  . Influenza-Unspecified 05/15/2013, 05/15/2014, 06/09/2015  . Moderna SARS-COVID-2 Vaccination 08/27/2019, 10/07/2019  . Pneumococcal Conjugate-13 10/09/2013  . Pneumococcal Polysaccharide-23 07/05/2018  . Tdap 04/07/2011    ROS: No CP, SOB, edema, change in vision or sensation. No Diabetic foot ulcers.  2. 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  He was on Xarelto previously but unfortunately developed a retinal hemorrhage and this was discontinued.  He is followed very closely by his retinal doctor, Dr. Tye Savoy   Has some easy bruising but no other problems.  ROS: Per HPI  No Known Allergies Past Medical History:  Diagnosis Date  . Adenomatous polyp of colon   . Arthritis    bilateral knees  . CKD (chronic kidney disease), stage II    Dr. Patel,nephrology "stable last 3 yrs"  . 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 Diabetes mellitus without complication ([place])    Dr. Laurance Flatten.PCP  . History of basal cell cancer   . History of DVT of lower extremity    2011--  LEFT LEG  . History of kidney stones   . History of melanoma  excision    RIGHT ARM  . Hyperlipidemia   . Hypertension   . Impaired hearing    hearing aids bilateral  . Melanoma ([place]) 1990   arm and back -Melanoma, basal cell others- Dr. Edgardo Roys follows.  . Near syncope 08/25/2015   Dr. Roland Rack once"found blood clots in lung"  . Nocturia   . Prostate cancer [facility]) 2014   Dr. Cherylann Parr- "seed implant" '14  . Ulcer of left lower leg ([place])    recent pcp visit dr Laurance Flatten--  ordered betadine wet/dry dsg daily (03-18-2013)  [place] IN 1 WEEK    Current Outpatient Medications:  .  amLODipine (NORVASC) 5 MG tablet, Take 1 tablet (5 mg total) by mouth daily., Disp: 90 tablet, Rfl: 0 .  aspirin 325 MG tablet, Take 325 mg by mouth daily., Disp: , Rfl:  .  atorvastatin (LIPITOR) 10 MG tablet, TAKE 1 TABLET BY MOUTH  DAILY, Disp: 90 tablet, Rfl: 0 .  blood glucose meter kit and supplies KIT, Dispense based on patient and insurance preference. Use up to four times daily as directed. (FOR ICD-9 250.00, 250.01)., Disp: 1 each, Rfl: 0 .  cholecalciferol (VITAMIN D3) 25 MCG (1000 UT) tablet, Take 1,000 Units by mouth daily., Disp: , Rfl:  .  fluticasone (FLONASE) 50 MCG/ACT nasal spray, Place 2 sprays into both nostrils daily., Disp: 16 g, Rfl: 6 .  furosemide (LASIX) 20 MG tablet,  TAKE 1 AND 1/2 TABLETS BY  MOUTH DAILY AS NEEDED, Disp: 135 tablet, Rfl: 0 .  glucose blood (ONE TOUCH ULTRA TEST) test strip, Test up to tid. DX E11.9, Disp: 100 each, Rfl: 2 .  metFORMIN (GLUCOPHAGE) 500 MG tablet, TAKE ONE-HALF TABLET BY  MOUTH DAILY WITH BREAKFAST, Disp: 45 tablet, Rfl: 3 .  olmesartan (BENICAR) 40 MG tablet, TAKE 1 TABLET BY MOUTH  DAILY, Disp: 90 tablet, Rfl: 0 Social History   Socioeconomic History  . Marital status: Married    Spouse name: Janelle  . Number of children: 3  . Years of education: master's  . Highest education level: Master's degree (e.g., MA, MS, MEng, MEd, MSW, MBA)  Occupational History  . Occupation:  Retail buyer    Comment: retired  Tobacco Use  . Smoking status: Never Smoker  . Smokeless tobacco: Never Used  Vaping Use  . Vaping Use: Never used  Substance and Sexual Activity  . Alcohol use: No  . Drug use: No  . Sexual activity: Not Currently  Other Topics Concern  . Not on file  Social History Narrative   Patient drinks 1 cup of coffee daily.   Patient is right handed.    Patient resides locally with his wife.  He has 3 sons, 2 sons reside in [place] where the other one resides in [place].  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   Social Determinants of Health   Financial Resource Strain: Low Risk   . Difficulty of Paying Living Expenses: Not hard at all  Food Insecurity: No Food Insecurity  . Worried About Charity fundraiser in the Last Year: Never true  . Ran Out of Food in the Last Year: Never true  Transportation Needs: No Transportation Needs  . Lack of Transportation (Medical): No  . Lack of Transportation (Non-Medical): No  Physical Activity: Insufficiently Active  . Days of Exercise per Week: 7 days  . Minutes of Exercise per Session: 20 min  Stress: No Stress Concern Present  . Feeling of Stress : Not at all  Social Connections: Socially Integrated  . Frequency of Communication with Friends and Family: More than three times a week  . Frequency of Social Gatherings with Friends and Family: More than three times a week  . Attends Religious Services: More than 4 times per year  . Active Member of Clubs or Organizations: Yes  . Attends Archivist Meetings: More than 4 times per year  . Marital Status: Married  Human resources officer Violence: Not At Risk  . Fear of Current or Ex-Partner: No  . Emotionally Abused: No  . Physically Abused: No  . Sexually Abused: No   Family History  Problem Relation  Age of Onset  . Breast cancer Mother   . Stroke Mother   . Hypertension Mother   . Lung cancer Father        smoked  . Ulcers Father   . Heart disease Brother 53       stent  . Esophageal cancer Brother   . Cancer Paternal Uncle        prostate  . Cancer Paternal Grandfather        prostate  . Heart attack Neg Hx   . Colon cancer Neg Hx   . Colon polyps Neg Hx   .  Rectal cancer Neg Hx   . Stomach cancer Neg Hx     Objective: Office vital signs reviewed. BP (!) 131/59   Pulse 78   Temp (!) 97.3 F (36.3 C)   Ht _0  (1.702 m)   Wt 226 lb 12.8 oz (102.9 kg)   SpO2 100%   BMI 35.52 kg/m   Physical Examination:  General: Awake, alert, well nourished, well appearing elderly male. No acute distress HEENT: Normal. Sclera white, MMM Cardio: regular rate and rhythm, S1S2 heard, no murmurs appreciated Pulm: clear to auscultation bilaterally, no wheezes, rhonchi or rales; normal work of breathing on room air Extremities: warm, well perfused, trace edema. No cyanosis or clubbing; +2 pulses bilaterally MSK: slow gait and slightly hunched station Skin: senile purpura noted, healing skin tear on left forearm  Assessment/ Plan: 79 y.o. male   1. Type 2 diabetes mellitus with vascular disease ([place])  Continues to be under excellent control with A1c 5.8 today.  STOP Metformin.  Recheck A1c in 3 months. I am optimistic that he will be able to remain off of sugar medications. - Bayer DCA Hb A1c Waived  2. Hyperlipidemia associated with type 2 diabetes mellitus (HCC) Continue statin - atorvastatin (LIPITOR) 10 MG tablet; Take 1 tablet (10 mg total) by mouth daily.  Dispense: 90 tablet; Refill: 3  3. Hypertension associated with type 2 diabetes mellitus ([place]) Blood pressures well controlled. Continue current regimen. Will CC labs to Dr. Posey Pronto - Basic Metabolic Panel - amLODipine (NORVASC) 5 MG tablet; Take 1 tablet (5 mg total) by mouth daily.  Dispense: 90 tablet; Refill: 3 -  olmesartan (BENICAR) 40 MG tablet; Take 1 tablet (40 mg total) by mouth daily.  Dispense: 90 tablet; Refill: 3  4. Chronic kidney disease (CKD), stage IV (severe) (HCC) CC labs to Dr. Posey Pronto - Basic Metabolic Panel  5. 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 Continue current regimen  6. History of pulmonary embolus (PE) - CBC - aspirin 325 MG tablet; Take 1 tablet (325 mg total) by mouth daily.  Dispense: 90 tablet; Refill: 3  7. History of deep vein thrombosis (DVT) of lower extremity - CBC - aspirin 325 MG tablet; Take 1 tablet (325 mg total) by mouth daily.  Dispense: 90 tablet; Refill: 3  8. Need for immunization against influenza Administered during today's visit - Flu Vaccine QUAD High Dose(Fluad)  Medications Discontinued During This Encounter  Medication Reason  . aspirin 325 MG tablet Reorder  . metFORMIN (GLUCOPHAGE) 500 MG tablet   . amLODipine (NORVASC) 5 MG tablet Reorder  . atorvastatin (LIPITOR) 10 MG tablet Reorder  . olmesartan (BENICAR) 40 MG tablet Reorder   No orders of the defined types were placed in this encounter.  No orders of the defined types were placed in this encounter.  [name], DO [place] [phone]

## 2020-05-13 NOTE — Patient Instructions (Signed)
A1c 5.8. STOP Metformin.  You had labs performed today.  You will be contacted with the results of the labs once they are available, usually in the next 3 business days for routine lab work.  If you have an active my chart account, they will be released to your MyChart.  If you prefer to have these labs released to you via telephone, please let us know.  If you had a pap smear or biopsy performed, expect to be contacted in about 7-10 days.

## 2020-06-09 DIAGNOSIS — B351 Tinea unguium: Secondary | ICD-10-CM | POA: Diagnosis not present

## 2020-06-09 DIAGNOSIS — M79676 Pain in unspecified toe(s): Secondary | ICD-10-CM | POA: Diagnosis not present

## 2020-06-09 DIAGNOSIS — E1151 Type 2 diabetes mellitus with diabetic peripheral angiopathy without gangrene: Secondary | ICD-10-CM | POA: Diagnosis not present

## 2020-06-09 DIAGNOSIS — L84 Corns and callosities: Secondary | ICD-10-CM | POA: Diagnosis not present

## 2020-06-17 ENCOUNTER — Other Ambulatory Visit: Payer: Self-pay | Admitting: *Deleted

## 2020-06-23 ENCOUNTER — Other Ambulatory Visit: Payer: Self-pay | Admitting: Family Medicine

## 2020-07-29 DIAGNOSIS — H3563 Retinal hemorrhage, bilateral: Secondary | ICD-10-CM | POA: Diagnosis not present

## 2020-07-29 DIAGNOSIS — H35371 Puckering of macula, right eye: Secondary | ICD-10-CM | POA: Diagnosis not present

## 2020-07-29 DIAGNOSIS — H353222 Exudative age-related macular degeneration, left eye, with inactive choroidal neovascularization: Secondary | ICD-10-CM | POA: Diagnosis not present

## 2020-07-29 DIAGNOSIS — H353211 Exudative age-related macular degeneration, right eye, with active choroidal neovascularization: Secondary | ICD-10-CM | POA: Diagnosis not present

## 2020-08-04 ENCOUNTER — Other Ambulatory Visit: Payer: Self-pay

## 2020-08-04 ENCOUNTER — Telehealth: Payer: Self-pay

## 2020-08-04 ENCOUNTER — Ambulatory Visit: Payer: Medicare Other | Admitting: *Deleted

## 2020-08-04 VITALS — BP 132/83 | HR 88

## 2020-08-04 DIAGNOSIS — E1159 Type 2 diabetes mellitus with other circulatory complications: Secondary | ICD-10-CM

## 2020-08-04 NOTE — Telephone Encounter (Signed)
Pts wife called stating that pt is taking 2 BP Rx's and BP is still running high. Last BP was taken yesterday and it was 154/76 after taking his BP meds.   Please advise and call patient.

## 2020-08-04 NOTE — Telephone Encounter (Signed)
Please have pt come in for BP check with nurse and bring his meter in so we can check for accuracy.

## 2020-08-04 NOTE — Progress Notes (Signed)
Patient in for BP check.  Office reading 132/83  88  Home reading 161/85  97

## 2020-08-04 NOTE — Telephone Encounter (Signed)
Appointment scheduled.

## 2020-08-04 NOTE — Telephone Encounter (Signed)
Patient states that his BP has been running high x 1 month.   12/20- 160/84, 155/72, 128/81 12/21- 143/82  Patient states that his BP has been running in the 160's and he is concerned. Please advise

## 2020-08-04 NOTE — Progress Notes (Signed)
Office BP appropriate.  Patient likely needs to invest in new BP cuff given the very large discrepancy.  [name] Ricardo Mayer, [facility]

## 2020-08-12 ENCOUNTER — Encounter: Payer: Self-pay | Admitting: Family Medicine

## 2020-08-12 ENCOUNTER — Ambulatory Visit (INDEPENDENT_AMBULATORY_CARE_PROVIDER_SITE_OTHER): Payer: Medicare Other | Admitting: Family Medicine

## 2020-08-12 DIAGNOSIS — J069 Acute upper respiratory infection, unspecified: Secondary | ICD-10-CM | POA: Diagnosis not present

## 2020-08-12 MED ORDER — BENZONATATE 200 MG PO CAPS: 200.0000 mg | ORAL_CAPSULE | Freq: Three times a day (TID) | ORAL | 0 refills | Status: DC | PRN

## 2020-08-12 NOTE — Progress Notes (Signed)
Subjective:    Patient ID: Ricardo Huguenin., male    DOB: May 08, 1941, 79 y.o.   MRN: [id number]   HPI: Ricardo Noone. is a 79 y.o. male presenting for coughing with rhinorrhea. Denies fever. Sleeping a lot. Cough if he exhales fast. Denies being short of breath. Walking less as a result. Minimal productivity of white sputum. Has had 3 Moderna CoVID vaccinations.   Depression screen [facility] 2/9 05/13/2020 01/10/2020 10/07/2019 08/06/2019 07/03/2019  Decreased Interest 0 0 0 0 0  Down, Depressed, Hopeless 0 0 0 0 0  PHQ - 2 Score 0 0 0 0 0  Some recent data might be hidden     Relevant past medical, surgical, family and social history reviewed and updated as indicated.  Interim medical history since our last visit reviewed. Allergies and medications reviewed and updated.  ROS:  Review of Systems  Constitutional: Negative for fever.  Respiratory: Negative for shortness of breath.   Cardiovascular: Negative for chest pain.  Musculoskeletal: Negative for arthralgias.  Skin: Negative for rash.     Social History   Tobacco Use  Smoking Status Never Smoker  Smokeless Tobacco Never Used       Objective:     Wt Readings from Last 3 Encounters:  05/13/20 226 lb 12.8 oz (102.9 kg)  01/10/20 233 lb 6.4 oz (105.9 kg)  10/07/19 229 lb (103.9 kg)     Exam deferred. Pt. Harboring due to COVID 19. Phone visit performed.   Assessment & Plan:   1. Viral URI     Meds ordered this encounter  Medications   benzonatate (TESSALON) 200 MG capsule    Sig: Take 1 capsule (200 mg total) by mouth 3 (three) times daily as needed for cough.    Dispense:  20 capsule    Refill:  0    Orders Placed This Encounter  Procedures   Novel Coronavirus, NAA ([facility])    Order Specific Question:   Is this test for diagnosis or screening    Answer:   Diagnosis of ill patient    Order Specific Question:   Symptomatic for COVID-19 as defined by CDC    Answer:   Yes    Order Specific  Question:   Date of Symptom Onset    Answer:   08/10/2020    Order Specific Question:   Hospitalized for COVID-19    Answer:   No    Order Specific Question:   Admitted to ICU for COVID-19    Answer:   No    Order Specific Question:   Previously tested for COVID-19    Answer:   No    Order Specific Question:   Resident in a congregate (group) care setting    Answer:   No    Order Specific Question:   Is the patient student?    Answer:   No    Order Specific Question:   Employed in healthcare setting    Answer:   No    Order Specific Question:   Has patient completed COVID vaccination(s) (2 doses of Pfizer/Moderna 1 dose of [place])    Answer:   Yes    Order Specific Question:   Release to patient    Answer:   Immediate      Diagnoses and all orders for this visit:  Viral URI -     Novel Coronavirus, NAA ([facility])  Other orders -     benzonatate (TESSALON) 200  MG capsule; Take 1 capsule (200 mg total) by mouth 3 (three) times daily as needed for cough.    Virtual Visit via telephone Note  I discussed the limitations, risks, security and privacy concerns of performing an evaluation and management service by telephone and the availability of in person appointments. The patient was identified with two identifiers. Pt.expressed understanding and agreed to proceed. Pt. Is at home. Dr. Livia Snellen is in his office.  Follow Up Instructions:   I discussed the assessment and treatment plan with the patient. The patient was provided an opportunity to ask questions and all were answered. The patient agreed with the plan and demonstrated an understanding of the instructions.   The patient was advised to call back or seek an in-person evaluation if the symptoms worsen or if the condition fails to improve as anticipated.   Total minutes including chart review and phone contact time: 13   Follow up plan: No follow-ups on file.  Claretta Fraise, MD [place]

## 2020-08-24 ENCOUNTER — Telehealth: Payer: Self-pay

## 2020-08-24 ENCOUNTER — Other Ambulatory Visit: Payer: Self-pay

## 2020-08-24 ENCOUNTER — Other Ambulatory Visit: Payer: Medicare Other

## 2020-08-24 DIAGNOSIS — N184 Chronic kidney disease, stage 4 (severe): Secondary | ICD-10-CM | POA: Diagnosis not present

## 2020-08-26 DIAGNOSIS — H353211 Exudative age-related macular degeneration, right eye, with active choroidal neovascularization: Secondary | ICD-10-CM | POA: Diagnosis not present

## 2020-08-26 DIAGNOSIS — H3563 Retinal hemorrhage, bilateral: Secondary | ICD-10-CM | POA: Diagnosis not present

## 2020-08-26 DIAGNOSIS — H353222 Exudative age-related macular degeneration, left eye, with inactive choroidal neovascularization: Secondary | ICD-10-CM | POA: Diagnosis not present

## 2020-08-26 DIAGNOSIS — H35371 Puckering of macula, right eye: Secondary | ICD-10-CM | POA: Diagnosis not present

## 2020-08-31 DIAGNOSIS — N184 Chronic kidney disease, stage 4 (severe): Secondary | ICD-10-CM | POA: Diagnosis not present

## 2020-08-31 DIAGNOSIS — I129 Hypertensive chronic kidney disease with stage 1 through stage 4 chronic kidney disease, or unspecified chronic kidney disease: Secondary | ICD-10-CM | POA: Diagnosis not present

## 2020-08-31 DIAGNOSIS — D631 Anemia in chronic kidney disease: Secondary | ICD-10-CM | POA: Diagnosis not present

## 2020-08-31 DIAGNOSIS — N2581 Secondary hyperparathyroidism of renal origin: Secondary | ICD-10-CM | POA: Diagnosis not present

## 2020-09-23 DIAGNOSIS — H43811 Vitreous degeneration, right eye: Secondary | ICD-10-CM | POA: Diagnosis not present

## 2020-09-23 DIAGNOSIS — H35371 Puckering of macula, right eye: Secondary | ICD-10-CM | POA: Diagnosis not present

## 2020-09-23 DIAGNOSIS — H353211 Exudative age-related macular degeneration, right eye, with active choroidal neovascularization: Secondary | ICD-10-CM | POA: Diagnosis not present

## 2020-09-23 DIAGNOSIS — H353222 Exudative age-related macular degeneration, left eye, with inactive choroidal neovascularization: Secondary | ICD-10-CM | POA: Diagnosis not present

## 2020-10-06 DIAGNOSIS — M79676 Pain in unspecified toe(s): Secondary | ICD-10-CM | POA: Diagnosis not present

## 2020-10-06 DIAGNOSIS — B351 Tinea unguium: Secondary | ICD-10-CM | POA: Diagnosis not present

## 2020-10-06 DIAGNOSIS — L84 Corns and callosities: Secondary | ICD-10-CM | POA: Diagnosis not present

## 2020-10-06 DIAGNOSIS — E1151 Type 2 diabetes mellitus with diabetic peripheral angiopathy without gangrene: Secondary | ICD-10-CM | POA: Diagnosis not present

## 2020-10-23 ENCOUNTER — Other Ambulatory Visit: Payer: Self-pay

## 2020-10-23 ENCOUNTER — Ambulatory Visit (INDEPENDENT_AMBULATORY_CARE_PROVIDER_SITE_OTHER): Payer: Medicare Other | Admitting: Family

## 2020-10-23 ENCOUNTER — Telehealth: Payer: Self-pay | Admitting: *Deleted

## 2020-10-23 ENCOUNTER — Encounter: Payer: Self-pay | Admitting: Family

## 2020-10-23 VITALS — BP 153/86 | HR 95 | Temp 96.7°F | Ht 67.0 in | Wt 227.4 lb

## 2020-10-23 DIAGNOSIS — I739 Peripheral vascular disease, unspecified: Secondary | ICD-10-CM | POA: Diagnosis not present

## 2020-10-23 DIAGNOSIS — S81811A Laceration without foreign body, right lower leg, initial encounter: Secondary | ICD-10-CM

## 2020-10-23 MED ORDER — MUPIROCIN 2 % EX OINT: 1.0000 "application " | TOPICAL_OINTMENT | Freq: Two times a day (BID) | CUTANEOUS | 1 refills | Status: DC

## 2020-10-23 MED ORDER — BACTROBAN NASAL 2 % NA OINT: 1.0000 "application " | TOPICAL_OINTMENT | Freq: Two times a day (BID) | NASAL | 0 refills | Status: DC

## 2020-10-23 NOTE — Telephone Encounter (Signed)
Fax from [facility]: Mupirocin nasal ointment 2% Note from pharmacy: unable to order bactroban nasal cream Can you send in a alternative

## 2020-10-23 NOTE — Patient Instructions (Signed)
Skin Tear A skin tear is a wound in which the top layers of skin have peeled off from the deeper skin or tissues underneath. This is a common problem as people get older because the skin becomes thinner and more fragile. In addition, some medicines, such as oral corticosteroids, can lead to thinning skin if they are taken for long periods of time. A skin tear is often repaired with tape or skin adhesive strips. Depending on the location of the wound, a bandage (dressing) may be applied over the tape or adhesive strips. Follow these instructions at home: Wound care  Clean the wound as told by your health care provider. You may be instructed to keep the wound dry for the first few days. If you are told to clean the wound: ? Wash the wound as told by your health care provider. This may include using mild soap and water, a wound cleanser, or a salt-water (saline) solution. ? If using soap, rinse the wound with water to remove all soap. ? Do not rub the wound dry. Pat it gently with a clean towel or let it air-dry.  Change any dressings as told by your health care provider. This may include changing the dressing if it gets wet, gets dirty, or starts to smell bad. ? Wash your hands with soap and water for at least 20 seconds before and after you change your bandage (dressing). If soap and water are not available, use hand sanitizer. ? Leave tape or skin adhesive strips in place. These skin closures may need to stay in place for 2 weeks or longer. If adhesive strip edges start to loosen and curl up, you may trim the loose edges. Do not remove adhesive strips completely unless your health care provider tells you to do that.  Check your wound every day for signs of infection. Check for: ? Redness, swelling, or pain. ? More fluid or blood. ? Warmth. ? Pus or a bad smell.  Do not scratch or pick at the wound.  Protect the injured area until it has healed.   Medicines  Take or apply over-the-counter  and prescription medicines only as told by your health care provider.  If you were prescribed an antibiotic medicine, take or apply it as told by your health care provider. Do not stop using the antibiotic even if your condition improves. General instructions  Keep the dressing dry as told by your health care provider.  Do not take baths, swim, use a hot tub, or do anything that puts your wound underwater until your health care provider approves. Ask your health care provider if you may take showers. You may only be allowed to take sponge baths.  Keep all follow-up visits. This is important.   Contact a health care provider if:  You have redness, swelling, or pain around your wound.  You have more fluid or blood coming from your wound.  Your wound, or the area around your wound, feels warm to the touch.  You have pus or a bad smell coming from your wound. Get help right away if:  You have a red streak that goes away from the skin tear.  You have a fever and chills, and your symptoms suddenly get worse. Summary  A skin tear is a wound in which the top layers of skin have peeled off from the deeper skin or tissues underneath.  A skin tear is often repaired with tape or skin adhesive strips, and a bandage (dressing) may be  applied over the tape or the adhesive strips.  Change any dressings as told by your health care provider.  Take or apply over-the-counter and prescription medicines only as told by your health care provider.  Contact a health care provider if you have signs of infection. This information is not intended to replace advice given to you by your health care provider. Make sure you discuss any questions you have with your health care provider. Document Revised: 11/06/2019 Document Reviewed: 11/06/2019 Elsevier Patient Education  [place].

## 2020-10-23 NOTE — Telephone Encounter (Signed)
Prescription sent to pharmacy.

## 2020-10-23 NOTE — Addendum Note (Signed)
Addended by: Evelina Dun A on: 10/23/2020 05:02 PM   Modules accepted: Orders

## 2020-10-23 NOTE — Progress Notes (Addendum)
Subjective:    Patient ID: Ricardo Mayer., male    DOB: [birth year], 80 y.o.   MRN: [id number]  Chief Complaint  Patient presents with  . Wound Check    Right lower leg x 6 days. Patient is not sure how he got wound.     HPI Pt presents to the office today with a lower leg wound that started 6 days. He reports he was wearing compression hose and doing yard work that day and is unsure how he injured it.   He report mild aching pain of 3 out 10 when he walks on it. Denies any fever. States he has been applying antibiotic ointment with a Band-Aid with mild improvement.    Review of Systems     Objective:   Physical Exam Vitals reviewed.  Constitutional:      General: He is not in acute distress.    Appearance: Normal appearance. He is well-developed. He is obese.  Eyes:     General:        Right eye: No discharge.        Left eye: No discharge.     Pupils: Pupils are equal, round, and reactive to light.  Neck:     Thyroid: No thyromegaly.  Cardiovascular:     Rate and Rhythm: Normal rate and regular rhythm.     Heart sounds: Normal heart sounds. No murmur heard.   Pulmonary:     Effort: Pulmonary effort is normal. No respiratory distress.     Breath sounds: Normal breath sounds. No wheezing.  Abdominal:     General: Bowel sounds are normal. There is no distension.     Palpations: Abdomen is soft.     Tenderness: There is no abdominal tenderness.  Musculoskeletal:        General: No tenderness. Normal range of motion.     Cervical back: Normal range of motion and neck supple.  Skin:    General: Skin is warm and dry.     Findings: Abrasion present. No erythema or rash.          Comments: Skin tear on right lower leg that is approx 2.5X0.7 cm, bilateral discoloration of bilateral legs.   Neurological:     Mental Status: He is alert and oriented to person, place, and time.     Cranial Nerves: No cranial nerve deficit.     Deep Tendon Reflexes: Reflexes are normal  and symmetric.  Psychiatric:        Behavior: Behavior normal.        Thought Content: Thought content normal.        Judgment: Judgment normal.        Area cleaned and Vaseline dressing applied.     BP (!) 153/86   Pulse 95   Temp (!) 96.7 F (35.9 C) (Temporal)   Ht 5\' 7"  (1.702 m)   Wt 227 lb 6.4 oz (103.1 kg)   SpO2 99%   BMI 35.62 kg/m   Assessment & Plan:  Ricardo Mayer. comes in today with chief complaint of Wound Check (Right lower leg x 6 days. Patient is not sure how he got wound. )   Diagnosis and orders addressed:  1. Skin tear of lower leg without complication, right, initial encounter Keep clean and dry Report any increased redness, fever, discharge, or pain Will hold off on antibiotics at this time - mupirocin nasal ointment (BACTROBAN NASAL) 2 %; Place 1 application into the nose 2 (  two) times daily. Use one-half of tube in each nostril twice daily for five (5) days. After application, press sides of nose together and gently massage.  Dispense: 10 g; Refill: 0  Evelina Dun, FNP

## 2020-11-18 DIAGNOSIS — H353222 Exudative age-related macular degeneration, left eye, with inactive choroidal neovascularization: Secondary | ICD-10-CM | POA: Diagnosis not present

## 2020-11-18 DIAGNOSIS — H43811 Vitreous degeneration, right eye: Secondary | ICD-10-CM | POA: Diagnosis not present

## 2020-11-18 DIAGNOSIS — H353211 Exudative age-related macular degeneration, right eye, with active choroidal neovascularization: Secondary | ICD-10-CM | POA: Diagnosis not present

## 2020-11-18 DIAGNOSIS — H3561 Retinal hemorrhage, right eye: Secondary | ICD-10-CM | POA: Diagnosis not present

## 2020-12-08 ENCOUNTER — Other Ambulatory Visit: Payer: Self-pay | Admitting: Family Medicine

## 2020-12-23 ENCOUNTER — Other Ambulatory Visit: Payer: Medicare Other

## 2020-12-23 DIAGNOSIS — N184 Chronic kidney disease, stage 4 (severe): Secondary | ICD-10-CM | POA: Diagnosis not present

## 2020-12-30 DIAGNOSIS — I129 Hypertensive chronic kidney disease with stage 1 through stage 4 chronic kidney disease, or unspecified chronic kidney disease: Secondary | ICD-10-CM | POA: Diagnosis not present

## 2020-12-30 DIAGNOSIS — D631 Anemia in chronic kidney disease: Secondary | ICD-10-CM | POA: Diagnosis not present

## 2020-12-30 DIAGNOSIS — N184 Chronic kidney disease, stage 4 (severe): Secondary | ICD-10-CM | POA: Diagnosis not present

## 2020-12-30 DIAGNOSIS — N2581 Secondary hyperparathyroidism of renal origin: Secondary | ICD-10-CM | POA: Diagnosis not present

## 2021-01-27 DIAGNOSIS — H353222 Exudative age-related macular degeneration, left eye, with inactive choroidal neovascularization: Secondary | ICD-10-CM | POA: Diagnosis not present

## 2021-01-27 DIAGNOSIS — H353211 Exudative age-related macular degeneration, right eye, with active choroidal neovascularization: Secondary | ICD-10-CM | POA: Diagnosis not present

## 2021-01-27 DIAGNOSIS — H35373 Puckering of macula, bilateral: Secondary | ICD-10-CM | POA: Diagnosis not present

## 2021-01-27 DIAGNOSIS — H31011 Macula scars of posterior pole (postinflammatory) (post-traumatic), right eye: Secondary | ICD-10-CM | POA: Diagnosis not present

## 2021-03-03 ENCOUNTER — Other Ambulatory Visit: Payer: Self-pay | Admitting: Family Medicine

## 2021-03-04 NOTE — Telephone Encounter (Signed)
[facility]. NTBS 6 mos ckup should have been in April. Mail order not sent

## 2021-03-23 ENCOUNTER — Other Ambulatory Visit: Payer: Self-pay

## 2021-03-23 ENCOUNTER — Ambulatory Visit: Payer: Medicare Other | Admitting: Physician Assistant

## 2021-03-23 ENCOUNTER — Encounter: Payer: Self-pay | Admitting: Physician Assistant

## 2021-03-23 DIAGNOSIS — Z1283 Encounter for screening for malignant neoplasm of skin: Secondary | ICD-10-CM | POA: Diagnosis not present

## 2021-03-23 DIAGNOSIS — I872 Venous insufficiency (chronic) (peripheral): Secondary | ICD-10-CM | POA: Diagnosis not present

## 2021-03-23 DIAGNOSIS — D485 Neoplasm of uncertain behavior of skin: Secondary | ICD-10-CM

## 2021-03-23 DIAGNOSIS — Z85828 Personal history of other malignant neoplasm of skin: Secondary | ICD-10-CM

## 2021-03-23 DIAGNOSIS — B078 Other viral warts: Secondary | ICD-10-CM | POA: Diagnosis not present

## 2021-03-23 DIAGNOSIS — C44319 Basal cell carcinoma of skin of other parts of face: Secondary | ICD-10-CM | POA: Diagnosis not present

## 2021-03-23 MED ORDER — MUPIROCIN 2 % EX OINT: 1.0000 "application " | TOPICAL_OINTMENT | Freq: Two times a day (BID) | CUTANEOUS | 1 refills | Status: DC

## 2021-03-23 NOTE — Progress Notes (Signed)
   Follow-Up Visit   Subjective  Ricardo Mayer. is a 80 y.o. male who presents for the following: Annual Exam (Left post lef non healing lesion x 1 week. It was a blister but it got rubbed off in the shower and now is a wound. No yellow drainage or pain. He has had to go to the wound center in the past due to nonhealing wound on his lower extremity. He also has facial lesions nonhealing x months. He has a significant history of personal BCC with Mohs procedure.).   The following portions of the chart were reviewed this encounter and updated as appropriate:  Tobacco  Allergies  Meds  Problems  Med Hx  Surg Hx  Fam Hx      Objective  Well appearing patient in no apparent distress; mood and affect are within normal limits.  All sun exposed areas plus back examined.  Left Buccal Cheek Small crust       Right Buccal Cheek Scaly papule with central ulceration     Left Lower Leg - Posterior Thin blister is now a superficial wound. He does have significant pitting edema and discoloration of the entire lower extremity.   Assessment & Plan  Neoplasm of uncertain behavior of skin (2) Left Buccal Cheek  Skin / nail biopsy Type of biopsy: tangential   Informed consent: discussed and consent obtained   Timeout: patient name, date of birth, surgical site, and procedure verified   Procedure prep:  Patient was prepped and draped in usual sterile fashion (Non sterile) Prep type:  Chlorhexidine Anesthesia: the lesion was anesthetized in a standard fashion   Anesthetic:  1% lidocaine w/ epinephrine 1-100,000 local infiltration Instrument used: flexible razor blade   Outcome: patient tolerated procedure well   Post-procedure details: wound care instructions given    Specimen 1 - Surgical pathology Differential Diagnosis: bcc vs scc  Check Margins: No  Right Buccal Cheek  Skin / nail biopsy Type of biopsy: tangential   Informed consent: discussed and consent obtained    Timeout: patient name, date of birth, surgical site, and procedure verified   Procedure prep:  Patient was prepped and draped in usual sterile fashion (Non sterile) Prep type:  Chlorhexidine Anesthesia: the lesion was anesthetized in a standard fashion   Anesthetic:  1% lidocaine w/ epinephrine 1-100,000 local infiltration Instrument used: flexible razor blade   Outcome: patient tolerated procedure well   Post-procedure details: wound care instructions given   Additional details:  Mohs if positive for [facility]  Specimen 2 - Surgical pathology Differential Diagnosis: bcc vs scc  Check Margins: No  Venous stasis dermatitis of left lower extremity Left Lower Leg - Posterior  Wound clinic referral if it does not heal.  mupirocin ointment (BACTROBAN) 2 % - Left Lower Leg - Posterior Apply 1 application topically 2 (two) times daily.    I, Kenley Rettinger, PA-C, have reviewed all documentation's for this visit.  The documentation on 03/23/21 for the exam, diagnosis, procedures and orders are all accurate and complete.

## 2021-03-23 NOTE — Patient Instructions (Signed)

## 2021-04-01 ENCOUNTER — Telehealth: Payer: Self-pay | Admitting: Physician Assistant

## 2021-04-01 NOTE — Telephone Encounter (Signed)
Path to patient. Mohs referral sent to the skin surgery center.

## 2021-04-01 NOTE — Telephone Encounter (Signed)
Patient left a message on office voice mail that he was calling for pathology results from last visit with [facility], PA-C.

## 2021-04-21 DIAGNOSIS — H353222 Exudative age-related macular degeneration, left eye, with inactive choroidal neovascularization: Secondary | ICD-10-CM | POA: Diagnosis not present

## 2021-04-21 DIAGNOSIS — H35373 Puckering of macula, bilateral: Secondary | ICD-10-CM | POA: Diagnosis not present

## 2021-04-21 DIAGNOSIS — H353211 Exudative age-related macular degeneration, right eye, with active choroidal neovascularization: Secondary | ICD-10-CM | POA: Diagnosis not present

## 2021-04-21 DIAGNOSIS — H2513 Age-related nuclear cataract, bilateral: Secondary | ICD-10-CM | POA: Diagnosis not present

## 2021-04-28 DIAGNOSIS — Z23 Encounter for immunization: Secondary | ICD-10-CM | POA: Diagnosis not present

## 2021-05-11 ENCOUNTER — Telehealth: Payer: Self-pay | Admitting: Physician Assistant

## 2021-05-11 NOTE — Telephone Encounter (Signed)
Patient's wife left message on office voice mail that Ricardo Mayer has questions about his Moh's appointment at [facility].

## 2021-05-11 NOTE — Telephone Encounter (Signed)
Patient given the skin surgery number to call

## 2021-05-17 ENCOUNTER — Ambulatory Visit (INDEPENDENT_AMBULATORY_CARE_PROVIDER_SITE_OTHER): Payer: Medicare Other

## 2021-05-17 ENCOUNTER — Other Ambulatory Visit: Payer: Self-pay

## 2021-05-17 DIAGNOSIS — Z23 Encounter for immunization: Secondary | ICD-10-CM | POA: Diagnosis not present

## 2021-05-21 ENCOUNTER — Other Ambulatory Visit: Payer: Self-pay | Admitting: Family Medicine

## 2021-05-21 DIAGNOSIS — I152 Hypertension secondary to endocrine disorders: Secondary | ICD-10-CM

## 2021-05-21 DIAGNOSIS — E1159 Type 2 diabetes mellitus with other circulatory complications: Secondary | ICD-10-CM

## 2021-05-27 ENCOUNTER — Other Ambulatory Visit: Payer: Self-pay | Admitting: Family Medicine

## 2021-05-27 DIAGNOSIS — E785 Hyperlipidemia, unspecified: Secondary | ICD-10-CM

## 2021-05-27 DIAGNOSIS — E1159 Type 2 diabetes mellitus with other circulatory complications: Secondary | ICD-10-CM

## 2021-05-27 DIAGNOSIS — E1169 Type 2 diabetes mellitus with other specified complication: Secondary | ICD-10-CM

## 2021-05-31 DIAGNOSIS — H25813 Combined forms of age-related cataract, bilateral: Secondary | ICD-10-CM | POA: Diagnosis not present

## 2021-06-03 DIAGNOSIS — C44319 Basal cell carcinoma of skin of other parts of face: Secondary | ICD-10-CM | POA: Diagnosis not present

## 2021-06-21 ENCOUNTER — Telehealth: Payer: Self-pay | Admitting: Family Medicine

## 2021-06-21 NOTE — Telephone Encounter (Signed)
Left message for patient to call back and schedule Medicare Annual Wellness Visit (AWV) to be completed by video or phone.   Last AWV: 08/06/2019  Please schedule at anytime with [facility] Health Advisor.  45 minute appointment  Any questions, please contact me at [phone]

## 2021-08-13 ENCOUNTER — Ambulatory Visit (INDEPENDENT_AMBULATORY_CARE_PROVIDER_SITE_OTHER): Payer: Medicare Other

## 2021-08-13 DIAGNOSIS — Z0000 Encounter for general adult medical examination without abnormal findings: Secondary | ICD-10-CM | POA: Diagnosis not present

## 2021-08-13 NOTE — Patient Instructions (Signed)
°  [facility] Maintenance Summary and Written Plan of Care  [name] ,  Thank you for allowing me to perform your Medicare Annual Wellness Visit and for your ongoing commitment to your health.   Health Maintenance & Immunization History Health Maintenance  Topic Date Due   Zoster Vaccines- Shingrix (1 of 2) Never done   COVID-19 Vaccine (3 - Moderna risk series) 11/04/2019   FOOT EXAM  07/02/2020   HEMOGLOBIN A1C  11/10/2020   OPHTHALMOLOGY EXAM  02/19/2021   TETANUS/TDAP  04/06/2021   COLONOSCOPY (Pts 45-51yrs Insurance coverage will need to be confirmed)  12/17/2023   Pneumonia Vaccine 30+ Years old  Completed   INFLUENZA VACCINE  Completed   HPV VACCINES  Aged Out   Immunization History  Administered Date(s) Administered   Fluad Quad(high Dose 65+) 05/16/2019, 05/13/2020, 05/17/2021   Influenza Whole 05/20/2016   Influenza-Unspecified 05/15/2013, 05/15/2014, 06/09/2015   Moderna Sars-Covid-2 Vaccination 08/27/2019, 10/07/2019   Pneumococcal Conjugate-13 10/09/2013   Pneumococcal Polysaccharide-23 07/05/2018   Tdap 04/07/2011    These are the patient goals that we discussed:  Goals Addressed             This Visit's Progress    Prevent falls           This is a list of Health Maintenance Items that are overdue or due now: Health Maintenance Due  Topic Date Due   Zoster Vaccines- Shingrix (1 of 2) Never done   COVID-19 Vaccine (3 - Moderna risk series) 11/04/2019   FOOT EXAM  07/02/2020   HEMOGLOBIN A1C  11/10/2020   OPHTHALMOLOGY EXAM  02/19/2021   TETANUS/TDAP  04/06/2021     Orders/Referrals Placed Today: No orders of the defined types were placed in this encounter.  (Contact our referral department at [phone] if you have not spoken with someone about your referral appointment within the next 5 days)    Follow-up Plan  Scheduled with Ricardo Doss, DO on 09/29/2021 at 1:00pm

## 2021-08-13 NOTE — Progress Notes (Signed)
MEDICARE ANNUAL WELLNESS VISIT  08/13/2021  Telephone Visit Disclaimer This Medicare AWV was conducted by telephone due to national recommendations for restrictions regarding the COVID-19 Pandemic (e.g. social distancing).  I verified, using two identifiers, that I am speaking with Ricardo Mayer. or their authorized healthcare agent. I discussed the limitations, risks, security, and privacy concerns of performing an evaluation and management service by telephone and the potential availability of an in-person appointment in the future. The patient expressed understanding and agreed to proceed.  Location of Patient: Home Location of Provider (nurse):  [facility]  Subjective:    Ricardo Stucke. is a 80 y.o. male patient of Ricardo Norlander, DO who had a Medicare Annual Wellness Visit today via telephone. Ricardo Mayer is Retired and lives with their spouse. he has three children. he reports that he is socially active and does interact with friends/family regularly. he is minimally physically active and enjoys woodworking.  Patient Care Team: Ricardo Norlander, DO as PCP - General (Family Medicine) Kathrynn Ducking, MD as Consulting Physician (Neurology) Jettie Booze, MD as Consulting Physician (Cardiology) Warren Danes, PA-C as Physician Assistant (Dermatology) Elmarie Shiley, MD as Consulting Physician (Nephrology) Irine Seal, MD as Attending Physician (Urology) Ilean China, RN as Registered Nurse  Advanced Directives 08/13/2021 08/06/2019 10/31/2018 07/05/2018 07/10/2017 07/10/2017 04/08/2016  Does Patient Have a Medical Advance Directive? _0  No Yes  Type of Advance Directive Living will;Healthcare Power of [place];Living will [place];Living will [place];Living will [place];Living will - [place];Living will  Does  patient want to make changes to medical advance directive? No - Patient declined No - Patient declined - No - Patient declined No - Patient declined - -  Copy of [place] in Chart? - Yes - validated most recent copy scanned in chart (See row information) - Yes - validated most recent copy scanned in chart (See row information) No - copy requested - No - copy requested  Would patient like information on creating a medical advance directive? - - - - No - Patient declined No - Patient declined -    Hospital Utilization Over the Past 12 Months: # of hospitalizations or ER visits: 0 # of surgeries: 0  Review of Systems    Patient reports that his overall health is unchanged compared to last year.  Negative except    Patient Reported Readings (BP, Pulse, CBG, Weight, etc) none  Pain Assessment Pain : No/denies pain     Current Medications & Allergies (verified) Allergies as of 08/13/2021   No Known Allergies      Medication List        Accurate as of August 13, 2021  1:44 PM. If you have any questions, ask your nurse or doctor.          STOP taking these medications    mupirocin ointment 2 % Commonly known as: BACTROBAN       TAKE these medications    amLODipine 5 MG tablet Commonly known as: NORVASC Take 1 tablet (5 mg total) by mouth daily.   aspirin 325 MG tablet Take 1 tablet (325 mg total) by mouth daily.   atorvastatin 10 MG tablet Commonly known as: LIPITOR Take 1 tablet (10 mg total) by mouth daily.   blood glucose meter kit and supplies Kit Dispense based on patient and  insurance preference. Use up to four times daily as directed. (FOR ICD-9 250.00, 250.01).   cholecalciferol 25 MCG (1000 UNIT) tablet Commonly known as: VITAMIN D3 Take 1,000 Units by mouth daily.   fluticasone 50 MCG/ACT nasal spray Commonly known as: FLONASE Place 2 sprays into both nostrils daily.   furosemide 20 MG tablet Commonly known as: LASIX TAKE  1 AND 1/2 TABLETS BY  MOUTH DAILY AS NEEDED   glucose blood test strip Commonly known as: ONE TOUCH ULTRA TEST Test up to tid. DX E11.9   olmesartan 40 MG tablet Commonly known as: BENICAR Take 1 tablet (40 mg total) by mouth daily.        History (reviewed): Past Medical History:  Diagnosis Date   Adenomatous polyp of colon    Arthritis    bilateral knees   Atypical mole 2016   left shoulder sever wider shave   BCC (basal cell carcinoma of skin) 2011   left neck tx mohs   BCC (basal cell carcinoma of skin) 2015   left sideburn tx with bx   BCC (basal cell carcinoma of skin) 2015   right post shoulder tx with bx   BCC (basal cell carcinoma of skin) 2016   left chest tx with bx   BCC (basal cell carcinoma of skin) 2017   left cheek cx3 72f   CKD (chronic kidney disease), stage II    Dr. 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 MLaurance FlattenPCP   History of basal cell cancer 2011   right inner eye tx mohs   History of DVT of lower extremity    2011--  LEFT LEG   History of kidney stones    History of melanoma excision    RIGHT ARM   Hyperlipidemia    Hypertension    Impaired hearing    hearing aids bilateral   [place] ([place]   arm and back per patient  -Melanoma   Near syncope 08/25/2015   Dr. VRoland Rackonce"found blood clots in lung"   Nocturia    Prostate cancer ([facility] 2014   Dr. WCherylann Parr "seed implant" '14   [place]    recent pcp visit dr mLaurance Flatten-  ordered betadine wet/dry dsg daily (03-18-2013)  [place] WEEK   Past Surgical History:  Procedure Laterality Date   COLONOSCOPY     EYE SURGERY     KIDNEY STONE EXTRACTIONS  LAST ONE 1970   X2 OPEN/  X1  URETEROSCOPY   MELANOMA EXCISION RIGHT ARM  1990   POLYPECTOMY     PROSTATE BIOPSY     RADIOACTIVE SEED IMPLANT N/A 03/21/2013   Procedure:  RADIOACTIVE SEED IMPLANT;  Surgeon: JMalka So MD;  [facility]  Service: Urology;  Laterality: N/A;  87 seeds implanted     TONSILLECTOMY     TOTAL KNEE ARTHROPLASTY Right 03/21/2016   Procedure: RIGHT TOTAL KNEE ARTHROPLASTY;  Surgeon: MParalee Cancel MD;  Location: [facility];  Service: Orthopedics;  Laterality: Right;   Family History  Problem Relation Age of Onset   Breast cancer Mother    Stroke Mother    Hypertension Mother    Lung cancer Father        smoked   Ulcers Father    Heart disease Brother 649  stent   Esophageal cancer Brother    Cancer Paternal Uncle        prostate   Cancer Paternal Grandfather        prostate   Heart attack Neg Hx    Colon cancer Neg Hx    Colon polyps Neg Hx    Rectal cancer Neg Hx    Stomach cancer Neg Hx    Social History   Socioeconomic History   Marital status: Married    Spouse name: Janelle   Number of children: 3   Years of education: master's   Highest education level: Master's degree (e.g., MA, MS, MEng, MEd, MSW, MBA)  Occupational History   Occupation: Retail buyer    Comment: retired  Tobacco Use   Smoking status: Never   Smokeless tobacco: Never  Vaping Use   Vaping Use: Never used  Substance and Sexual Activity   Alcohol use: No   Drug use: No   Sexual activity: Not Currently  Other Topics Concern   Not on file  Social History Narrative   Patient drinks 1 cup of coffee daily.   Patient is right handed.    Patient resides locally with his wife.  He has 3 sons, 2 sons reside in [place] where the other one resides in [place].  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   Social Determinants of Health   Financial Resource Strain: Not on file  Food Insecurity: Not on file  Transportation Needs: Not on file  Physical Activity: Not on file   Stress: Not on file  Social Connections: Not on file    Activities of Daily Living In your present state of health, do you have any difficulty performing the following activities: 08/13/2021  Hearing? N  Vision? N  Difficulty concentrating or making decisions? N  Walking or climbing stairs? N  Dressing or bathing? N  Doing errands, shopping? N  Preparing Food and eating ? N  Using the Toilet? N  In the past six months, have you accidently leaked urine? Y  Do you have problems with loss of bowel control? N  Managing your Medications? N  Managing your Finances? N  Housekeeping or managing your Housekeeping? N  Some recent data might be hidden    Patient Education/ Literacy How often do you need to have someone help you when you read instructions, pamphlets, or other written materials from your doctor or pharmacy?: 1 - Never What is the last grade level you completed in school?: Master's  Exercise Current Exercise Habits: Home exercise routine, Type of exercise: walking, Time (Minutes): 45, Frequency (Times/Week): 7, Weekly Exercise (Minutes/Week): 315, Intensity: Mild  Diet Patient reports consuming 3 meals a day and 2 snack(s) a day Patient reports that his primary diet is: Regular Patient reports that she does have regular access to food.   Depression Screen PHQ 2/9 Scores 05/13/2020 01/10/2020 10/07/2019 08/06/2019 07/03/2019 03/15/2019 10/24/2018  PHQ - 2 Score 0 0 0 0 0 0 0     Fall Risk Fall Risk  05/13/2020 01/10/2020 10/07/2019 08/06/2019 07/03/2019  Falls in the past year? 0 0 0 0 0  Number falls in past yr: - - - - -  Comment - - - - -  Injury with Fall? - - - - -  Follow up - - - - -  Objective:  Ricardo Mayer. seemed alert and oriented and he participated appropriately during our telephone visit.  Blood Pressure Weight BMI  BP Readings from Last 3 Encounters:  10/23/20 (!) 153/86  08/04/20 132/83  05/13/20 (!) 131/59   Wt Readings from Last 3  Encounters:  10/23/20 227 lb 6.4 oz (103.1 kg)  05/13/20 226 lb 12.8 oz (102.9 kg)  01/10/20 233 lb 6.4 oz (105.9 kg)   BMI Readings from Last 1 Encounters:  10/23/20 35.62 kg/m    *Unable to obtain current vital signs, weight, and BMI due to telephone visit type  Hearing/Vision  Burman did not  seem to have difficulty with hearing/understanding during the telephone conversation Reports that he has had a formal eye exam by an eye care professional within the past year Reports that he has not had a formal hearing evaluation within the past year *Unable to fully assess hearing and vision during telephone visit type  Cognitive Function: 6CIT Screen 08/13/2021 08/13/2021 08/06/2019  What Year? - 0 points 0 points  What month? - 0 points 0 points  What time? 0 points 0 points 0 points  Count back from 20 0 points 0 points 0 points  Months in reverse - 0 points 0 points  Repeat phrase - 0 points 2 points  Total Score - 0 2   (Normal:0-7, Significant for Dysfunction: >8)  Normal Cognitive Function Screening: Yes   Immunization & Health Maintenance Record Immunization History  Administered Date(s) Administered   Fluad Quad(high Dose 65+) 05/16/2019, 05/13/2020, 05/17/2021   Influenza Whole 05/20/2016   Influenza-Unspecified 05/15/2013, 05/15/2014, 06/09/2015   Moderna Sars-Covid-2 Vaccination 08/27/2019, 10/07/2019   Pneumococcal Conjugate-13 10/09/2013   Pneumococcal Polysaccharide-23 07/05/2018   Tdap 04/07/2011    Health Maintenance  Topic Date Due   Zoster Vaccines- Shingrix (1 of 2) Never done   COVID-19 Vaccine (3 - Moderna risk series) 11/04/2019   FOOT EXAM  07/02/2020   HEMOGLOBIN A1C  11/10/2020   OPHTHALMOLOGY EXAM  02/19/2021   TETANUS/TDAP  04/06/2021   COLONOSCOPY (Pts 45-50yr Insurance coverage will need to be confirmed)  12/17/2023   Pneumonia Vaccine 80 Years old  Completed   INFLUENZA VACCINE  Completed   HPV VACCINES  Aged Out       Assessment   This is a routine wellness examination for [employer]Marland Kitchen Health Maintenance: Due or Overdue Health Maintenance Due  Topic Date Due   Zoster Vaccines- Shingrix (1 of 2) Never done   COVID-19 Vaccine (3 - Moderna risk series) 11/04/2019   FOOT EXAM  07/02/2020   HEMOGLOBIN A1C  11/10/2020   OPHTHALMOLOGY EXAM  02/19/2021   TETANUS/TDAP  04/06/2021    [facility] not need a referral for Community Assistance: Care Management:   no Social Work:    no Prescription Assistance:  no Nutrition/Diabetes Education:  no   Plan:  Personalized Goals  Goals Addressed             This Visit's Progress    Prevent falls         Personalized Health Maintenance & Screening Recommendations  Tetanus Vaccine Shingles Vaccine Hemoglobin A1C check  Lung Cancer Screening Recommended: no (Low Dose CT Chest recommended if Age 463-80years, 30 pack-year currently smoking OR have quit w/in past 15 years) Hepatitis C Screening recommended: no HIV Screening recommended: no  Advanced Directives: Written information was not prepared per patient's request.  Referrals & Orders No orders of the  defined types were placed in this encounter.   Follow-up Plan Follow-up with Ricardo Norlander, DO as planned Schedule 09/29/2021    I have personally reviewed and noted the following in the patients chart:   Medical and social history Use of alcohol, tobacco or illicit drugs  Current medications and supplements Functional ability and status Nutritional status Physical activity Advanced directives List of other physicians Hospitalizations, surgeries, and ER visits in previous 12 months Vitals Screenings to include cognitive, depression, and falls Referrals and appointments  In addition, I have reviewed and discussed with Ricardo Mayer. certain preventive protocols, quality metrics, and best practice recommendations. A written personalized care plan for preventive  services as well as general preventive health recommendations is available and can be mailed to the patient at his request.      Alphonzo Dublin  08/13/2021

## 2021-08-18 DIAGNOSIS — H2513 Age-related nuclear cataract, bilateral: Secondary | ICD-10-CM | POA: Diagnosis not present

## 2021-08-18 DIAGNOSIS — H353232 Exudative age-related macular degeneration, bilateral, with inactive choroidal neovascularization: Secondary | ICD-10-CM | POA: Diagnosis not present

## 2021-08-18 DIAGNOSIS — H35373 Puckering of macula, bilateral: Secondary | ICD-10-CM | POA: Diagnosis not present

## 2021-08-18 DIAGNOSIS — H43811 Vitreous degeneration, right eye: Secondary | ICD-10-CM | POA: Diagnosis not present

## 2021-09-04 ENCOUNTER — Other Ambulatory Visit: Payer: Self-pay | Admitting: Family Medicine

## 2021-09-04 DIAGNOSIS — E1169 Type 2 diabetes mellitus with other specified complication: Secondary | ICD-10-CM

## 2021-09-04 DIAGNOSIS — E1159 Type 2 diabetes mellitus with other circulatory complications: Secondary | ICD-10-CM

## 2021-09-04 DIAGNOSIS — E785 Hyperlipidemia, unspecified: Secondary | ICD-10-CM

## 2021-09-23 DIAGNOSIS — H02834 Dermatochalasis of left upper eyelid: Secondary | ICD-10-CM | POA: Diagnosis not present

## 2021-09-23 DIAGNOSIS — H353212 Exudative age-related macular degeneration, right eye, with inactive choroidal neovascularization: Secondary | ICD-10-CM | POA: Diagnosis not present

## 2021-09-23 DIAGNOSIS — H01001 Unspecified blepharitis right upper eyelid: Secondary | ICD-10-CM | POA: Diagnosis not present

## 2021-09-23 DIAGNOSIS — H2513 Age-related nuclear cataract, bilateral: Secondary | ICD-10-CM | POA: Diagnosis not present

## 2021-09-23 DIAGNOSIS — H02831 Dermatochalasis of right upper eyelid: Secondary | ICD-10-CM | POA: Diagnosis not present

## 2021-09-23 DIAGNOSIS — H35371 Puckering of macula, right eye: Secondary | ICD-10-CM | POA: Diagnosis not present

## 2021-09-28 ENCOUNTER — Ambulatory Visit (INDEPENDENT_AMBULATORY_CARE_PROVIDER_SITE_OTHER): Payer: Medicare Other | Admitting: Family Medicine

## 2021-09-28 ENCOUNTER — Encounter: Payer: Self-pay | Admitting: Family Medicine

## 2021-09-28 VITALS — BP 155/85 | HR 95 | Temp 97.6°F | Wt 225.0 lb

## 2021-09-28 DIAGNOSIS — M25561 Pain in right knee: Secondary | ICD-10-CM | POA: Diagnosis not present

## 2021-09-28 MED ORDER — PREDNISONE 10 MG (21) PO TBPK: ORAL_TABLET | ORAL | 0 refills | Status: DC

## 2021-09-28 NOTE — Patient Instructions (Signed)
Voltaren gel 

## 2021-09-28 NOTE — Progress Notes (Signed)
Assessment & Plan:  1. Acute pain of right knee Education provided on acute knee pain. Encouraged use of Voltaren gel QID PRN.  - predniSONE (STERAPRED UNI-PAK 21 TAB) 10 MG (21) TBPK tablet; As directed x 6 days  Dispense: 21 tablet; Refill: 0   Follow up plan: Return if symptoms worsen or fail to improve.  Hendricks Limes, MSN, APRN, FNP-C [facility]  Subjective:   Patient ID: Ricardo Pickup., male    DOB: 03-17-1941, 81 y.o.   MRN: [id number]  HPI: Ricardo Giammarino. is a 81 y.o. male presenting on 09/28/2021 for Knee Pain (Soar knees, mostly on the r-side/a little on the left. Been hurting since last week Thursday. Can't sleep due to pain)  Knee Pain: Patient presents with knee pain involving the  bilateral knee (right worse than left). Onset of the symptoms was a week ago. Patient reports his activity on Wednesday was 50% more than his usual. States he walked more steps and was standing on an incline working in the garage. Pain improves when he is up walking. Pain is worse at night. He states he is unable to get any sleep due to the pain. Patient has had prior knee surgery 10+ years ago. He has an appointment with his orthopedic on Thursday. Treatment to date: prescription NSAIDS which are effective - he is alternating Excedrin and Aleve.   ROS: Negative unless specifically indicated above in HPI.   Relevant past medical history reviewed and updated as indicated.   Allergies and medications reviewed and updated.   Current Outpatient Medications:    amLODipine (NORVASC) 5 MG tablet, TAKE 1 TABLET BY MOUTH  DAILY, Disp: 90 tablet, Rfl: 0   aspirin 325 MG tablet, Take 1 tablet (325 mg total) by mouth daily., Disp: 90 tablet, Rfl: 3   atorvastatin (LIPITOR) 10 MG tablet, TAKE 1 TABLET BY MOUTH  DAILY, Disp: 90 tablet, Rfl: 0   blood glucose meter kit and supplies KIT, Dispense based on patient and insurance preference. Use up to four times daily as directed.  (FOR ICD-9 250.00, 250.01)., Disp: 1 each, Rfl: 0   cholecalciferol (VITAMIN D3) 25 MCG (1000 UT) tablet, Take 1,000 Units by mouth daily., Disp: , Rfl:    furosemide (LASIX) 20 MG tablet, TAKE 1 AND 1/2 TABLETS BY  MOUTH DAILY AS NEEDED, Disp: 135 tablet, Rfl: 0   olmesartan (BENICAR) 40 MG tablet, TAKE 1 TABLET BY MOUTH  DAILY, Disp: 90 tablet, Rfl: 0   fluticasone (FLONASE) 50 MCG/ACT nasal spray, Place 2 sprays into both nostrils daily. (Patient not taking: Reported on 09/28/2021), Disp: 16 g, Rfl: 6   glucose blood (ONE TOUCH ULTRA TEST) test strip, Test up to tid. DX E11.9 (Patient not taking: Reported on 09/28/2021), Disp: 100 each, Rfl: 2  No Known Allergies  Objective:   BP (!) 155/85    Pulse (!) 58    Temp 97.6 F (36.4 C) (Temporal)    Wt 225 lb (102.1 kg)    SpO2 100%    BMI 35.24 kg/m    Physical Exam Vitals reviewed.  Constitutional:      General: He is not in acute distress.    Appearance: Normal appearance. He is obese. He is not ill-appearing, toxic-appearing or diaphoretic.  HENT:     Head: Normocephalic and atraumatic.  Eyes:     General: No scleral icterus.       Right eye: No discharge.  Left eye: No discharge.     Conjunctiva/sclera: Conjunctivae normal.  Cardiovascular:     Rate and Rhythm: Normal rate.  Pulmonary:     Effort: Pulmonary effort is normal. No respiratory distress.  Musculoskeletal:        General: Normal range of motion.     Cervical back: Normal range of motion.     Right knee: No swelling, deformity, effusion, erythema, ecchymosis or lacerations. Tenderness present over the medial joint line. No lateral joint line or patellar tendon tenderness.  Skin:    General: Skin is warm and dry.  Neurological:     Mental Status: He is alert and oriented to person, place, and time. Mental status is at baseline.  Psychiatric:        Mood and Affect: Mood normal.        Behavior: Behavior normal.        Thought Content: Thought content normal.         Judgment: Judgment normal.

## 2021-09-29 ENCOUNTER — Ambulatory Visit: Payer: Medicare Other | Admitting: Family Medicine

## 2021-09-30 DIAGNOSIS — M25561 Pain in right knee: Secondary | ICD-10-CM | POA: Diagnosis not present

## 2021-09-30 DIAGNOSIS — M25562 Pain in left knee: Secondary | ICD-10-CM | POA: Diagnosis not present

## 2021-09-30 DIAGNOSIS — M1712 Unilateral primary osteoarthritis, left knee: Secondary | ICD-10-CM | POA: Diagnosis not present

## 2021-10-01 ENCOUNTER — Ambulatory Visit (INDEPENDENT_AMBULATORY_CARE_PROVIDER_SITE_OTHER): Payer: Medicare Other | Admitting: Family Medicine

## 2021-10-01 ENCOUNTER — Encounter: Payer: Self-pay | Admitting: Family Medicine

## 2021-10-01 VITALS — BP 145/79 | HR 98 | Temp 97.9°F | Ht 67.0 in | Wt 225.0 lb

## 2021-10-01 DIAGNOSIS — E785 Hyperlipidemia, unspecified: Secondary | ICD-10-CM

## 2021-10-01 DIAGNOSIS — I152 Hypertension secondary to endocrine disorders: Secondary | ICD-10-CM | POA: Diagnosis not present

## 2021-10-01 DIAGNOSIS — E1169 Type 2 diabetes mellitus with other specified complication: Secondary | ICD-10-CM | POA: Diagnosis not present

## 2021-10-01 DIAGNOSIS — N184 Chronic kidney disease, stage 4 (severe): Secondary | ICD-10-CM | POA: Diagnosis not present

## 2021-10-01 DIAGNOSIS — E1159 Type 2 diabetes mellitus with other circulatory complications: Secondary | ICD-10-CM

## 2021-10-01 LAB — BAYER DCA HB A1C WAIVED: HB A1C (BAYER DCA - WAIVED): 5.8 % — ABNORMAL HIGH (ref 4.8–5.6)

## 2021-10-01 MED ORDER — AMLODIPINE BESYLATE 5 MG PO TABS: 5.0000 mg | ORAL_TABLET | Freq: Every day | ORAL | 3 refills | Status: DC

## 2021-10-01 MED ORDER — OLMESARTAN MEDOXOMIL 40 MG PO TABS: 40.0000 mg | ORAL_TABLET | Freq: Every day | ORAL | 3 refills | Status: DC

## 2021-10-01 MED ORDER — ATORVASTATIN CALCIUM 10 MG PO TABS: 10.0000 mg | ORAL_TABLET | Freq: Every day | ORAL | 3 refills | Status: DC

## 2021-10-01 NOTE — Progress Notes (Signed)
Subjective: CC: DM PCP: [name], DO YKZ:LDJTT D Nikolaus Pienta. is a 81 y.o. male presenting to clinic today for:  1. Type 2 Diabetes with hypertension, hyperlipidemia and CKD4:  Reports compliance with all medications.  His diabetes has been diet controlled.  He would like his lab work CCed to Dr. Posey Pronto, his nephrologist.  He will be seeing Dr. Alvan Dame soon for the pain in his right knee.  The pain is getting better but still bothersome  Last eye exam: UTD, sees [facility] in [place] Last foot exam: needs Last A1c:  Lab Results  Component Value Date   HGBA1C 5.8 05/13/2020   Nephropathy screen indicated?: on ARB Last flu, zoster and/or pneumovax:  Immunization History  Administered Date(s) Administered   Fluad Quad(high Dose 65+) 05/16/2019, 05/13/2020, 05/17/2021   Influenza Whole 05/20/2016   Influenza-Unspecified 05/15/2013, 05/15/2014, 06/09/2015   Moderna Sars-Covid-2 Vaccination 08/27/2019, 10/07/2019   Pneumococcal Conjugate-13 10/09/2013   Pneumococcal Polysaccharide-23 07/05/2018   Tdap 04/07/2011    ROS: Denies chest pain, shortness of breath.  Vision has been stable but he is scheduled to have cataract surgery in March.  Last retinal exam was good.   ROS: Per HPI  No Known Allergies Past Medical History:  Diagnosis Date   Adenomatous polyp of colon    Arthritis    bilateral knees   Atypical mole 2016   left shoulder sever wider shave   BCC (basal cell carcinoma of skin) 2011   left neck tx mohs   BCC (basal cell carcinoma of skin) 2015   left sideburn tx with bx   BCC (basal cell carcinoma of skin) 2015   right post shoulder tx with bx   BCC (basal cell carcinoma of skin) 2016   left chest tx with bx   BCC (basal cell carcinoma of skin) 2017   left cheek cx3 104f   CKD (chronic kidney disease), stage II    Dr. 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 MLaurance FlattenPCP   History of basal cell cancer 2011   right inner eye tx mohs   History of DVT of lower extremity    2011--  LEFT LEG   History of kidney stones    History of melanoma excision    RIGHT ARM   Hyperlipidemia    Hypertension    Impaired hearing    hearing aids bilateral   Melanoma ([facility] 1990   arm and back per patient  -Melanoma   Near syncope 08/25/2015   Dr. VRoland Rackonce"found blood clots in lung"   Nocturia    Prostate cancer ([facility] 2014   Dr. WCherylann Parr "seed implant" '14   Ulcer of left lower leg ([facility]    recent pcp visit dr mLaurance Flatten-  ordered betadine wet/dry dsg daily (03-18-2013)  CBoazIN 1 WEEK    Current Outpatient Medications:    amLODipine (NORVASC) 5 MG tablet, TAKE 1 TABLET BY MOUTH  DAILY, Disp: 90 tablet, Rfl: 0   aspirin 325 MG tablet, Take 1 tablet (325 mg total) by mouth daily., Disp: 90 tablet, Rfl: 3   atorvastatin (LIPITOR) 10 MG tablet, TAKE 1 TABLET BY MOUTH  DAILY, Disp: 90 tablet, Rfl: 0   cholecalciferol (VITAMIN D3) 25 MCG (1000 UT) tablet, Take 1,000 Units by mouth daily., Disp: , Rfl:    furosemide (LASIX) 20  MG tablet, TAKE 1 AND 1/2 TABLETS BY  MOUTH DAILY AS NEEDED, Disp: 135 tablet, Rfl: 0   olmesartan (BENICAR) 40 MG tablet, TAKE 1 TABLET BY MOUTH  DAILY, Disp: 90 tablet, Rfl: 0   predniSONE (STERAPRED UNI-PAK 48 TAB) 5 MG (48) TBPK tablet, Take 10 mg by mouth 2 (two) times daily., Disp: , Rfl:    glucose blood (ONE TOUCH ULTRA TEST) test strip, Test up to tid. DX E11.9 (Patient not taking: Reported on 09/28/2021), Disp: 100 each, Rfl: 2 Social History   Socioeconomic History   Marital status: Married    Spouse name: Janelle   Number of children: 3   Years of education: master's   Highest education level: Master's degree (e.g., MA, MS, MEng, MEd, MSW, MBA)  Occupational History   Occupation: Retail buyer    Comment: retired  Tobacco Use   Smoking status: Never    Smokeless tobacco: Never  Vaping Use   Vaping Use: Never used  Substance and Sexual Activity   Alcohol use: No   Drug use: No   Sexual activity: Not Currently  Other Topics Concern   Not on file  Social History Narrative   Patient drinks 1 cup of coffee daily.   Patient is right handed.    Patient resides locally with his wife.  He has 3 sons, 2 sons reside in [place] where the other one resides in [place].  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   Social Determinants of Health   Financial Resource Strain: Not on file  Food Insecurity: Not on file  Transportation Needs: Not on file  Physical Activity: Not on file  Stress: Not on file  Social Connections: Not on file  Intimate Partner Violence: Not on file   Family History  Problem Relation Age of Onset   Breast cancer Mother    Stroke Mother    Hypertension Mother    Lung cancer Father        smoked   Ulcers Father    Heart disease Brother 79       stent   Esophageal cancer Brother    Cancer Paternal Uncle        prostate   Cancer Paternal Grandfather        prostate   Heart attack Neg Hx    Colon cancer Neg Hx    Colon polyps Neg Hx    Rectal cancer Neg Hx    Stomach cancer Neg Hx     Objective: Office vital signs reviewed. BP (!) 145/79    Pulse 98    Temp 97.9 F (36.6 C)    Ht _0  (1.702 m)    Wt 225 lb (102.1 kg)    SpO2 95%    BMI 35.24 kg/m   Physical Examination:  General: Awake, alert, nontoxic elderly male, No acute distress HEENT: Moist mucous membranes Cardio: regular rate and rhythm, S1S2 heard, no murmurs appreciated Pulm: clear to auscultation bilaterally, no wheezes, rhonchi or rales; normal work of breathing on room air DM foot: Diabetic Foot Exam - Simple   Simple Foot Form Diabetic Foot exam was performed with the following findings: Yes  10/01/2021  6:09 PM  Visual Inspection See comments: Yes Sensation Testing Intact to touch and monofilament testing bilaterally: Yes Pulse Check Posterior Tibialis and Dorsalis pulse intact bilaterally:  Yes Comments Monofilament intact.  Onychomycotic changes noted throughout the nails bilaterally    Assessment/ Plan: 81 y.o. male   Type 2 diabetes mellitus with vascular disease ([place])  Hypertension associated with type 2 diabetes mellitus ([place]) - Plan: CMP14+EGFR, Lipid panel, Bayer DCA Hb A1c Waived, amLODipine (NORVASC) 5 MG tablet, olmesartan (BENICAR) 40 MG tablet  Hyperlipidemia associated with type 2 diabetes mellitus ([place]) - Plan: atorvastatin (LIPITOR) 10 MG tablet  Chronic kidney disease (CKD), stage IV (severe) (H[facility])  Sugar remains controlled.  No changes needed.  Blood pressure is borderline but technically controlled for age upon second check.  Continue current regimen.  Renewal sent  Continue statin.  Lipitor renewed  Continue to follow-up with nephrology.  We will CC his lab work to Dr. Posey Pronto once available  Orders Placed This Encounter  Procedures   CMP14+EGFR   Lipid panel   Bayer [place] Hb A1c Waived   No orders of the defined types were placed in this encounter.    [name], DO [facility] [phone]

## 2021-10-04 ENCOUNTER — Telehealth: Payer: Self-pay | Admitting: Family Medicine

## 2021-10-04 LAB — CMP14+EGFR
ALT: 16 IU/L (ref 0–44)
AST: 14 IU/L (ref 0–40)
Albumin/Globulin Ratio: 2.3 — ABNORMAL HIGH (ref 1.2–2.2)
Albumin: 3.9 g/dL (ref 3.7–4.7)
Alkaline Phosphatase: 87 IU/L (ref 44–121)
BUN/Creatinine Ratio: 20 (ref 10–24)
BUN: 71 mg/dL — ABNORMAL HIGH (ref 8–27)
Bilirubin Total: 0.3 mg/dL (ref 0.0–1.2)
CO2: 17 mmol/L — ABNORMAL LOW (ref 20–29)
Calcium: 8.8 mg/dL (ref 8.6–10.2)
Chloride: 102 mmol/L (ref 96–106)
Creatinine, Ser: 3.48 mg/dL (ref 0.76–1.27)
Globulin, Total: 1.7 g/dL (ref 1.5–4.5)
Glucose: 142 mg/dL — ABNORMAL HIGH (ref 70–99)
Potassium: 5.6 mmol/L — ABNORMAL HIGH (ref 3.5–5.2)
Sodium: 136 mmol/L (ref 134–144)
Total Protein: 5.6 g/dL — ABNORMAL LOW (ref 6.0–8.5)
eGFR: 17 mL/min/{1.73_m2} — ABNORMAL LOW (ref >59)

## 2021-10-04 LAB — LIPID PANEL
Chol/HDL Ratio: 2 ratio (ref 0.0–5.0)
Cholesterol, Total: 117 mg/dL (ref 100–199)
HDL: 58 mg/dL (ref >39)
LDL Chol Calc (NIH): 43 mg/dL (ref 0–99)
Triglycerides: 80 mg/dL (ref 0–149)
VLDL Cholesterol Cal: 16 mg/dL (ref 5–40)

## 2021-10-04 NOTE — Telephone Encounter (Signed)
[facility] to Nephrology, Dr. Posey Pronto

## 2021-10-18 ENCOUNTER — Encounter (HOSPITAL_COMMUNITY)
Admission: RE | Admit: 2021-10-18 | Discharge: 2021-10-18 | Disposition: A | Discharge status: Home / Self Care | Payer: Medicare Other | Source: Ambulatory Visit | Attending: Ophthalmology | Admitting: Ophthalmology

## 2021-10-18 ENCOUNTER — Other Ambulatory Visit: Payer: Self-pay

## 2021-10-18 ENCOUNTER — Encounter (HOSPITAL_COMMUNITY): Payer: Self-pay

## 2021-10-18 HISTORY — DX: Unspecified macular degeneration: H35.30

## 2021-10-20 NOTE — H&P (Signed)
Surgical History & Physical ? ?Patient Name: Ricardo Mayer DOB: Feb 20, 1941 ? ?Surgery: Cataract extraction with intraocular lens implant phacoemulsification; Left Eye ? ?Surgeon: Baruch Goldmann MD ?Surgery Date:  10-25-21 ?Pre-Op Date:  10-18-21 ? ?HPI: ?A 81 Yr. old male patient is here for a cataract evaluation of both eyes, referred from Dr. Wynetta Emery. Since the last visit, the affected area is worsening. The patient's vision is blurry. Patient describes difficulty reading small print on books/labels, experiencing glare on bright sunny days, and difficulty driving at night due to halos/glare. This is negatively affecting the patient's quality of life and the patient is unable to function adequately in life with the current level of vision. Patient has a history of Macular degeneration and is receiving injections from a retinal specialist; Dr. Sherlynn Stalls. ? ?Medical History: ?Macula Degeneration ?High Blood Pressure ?Lung Problems ? ?Review of Systems ?Negative Allergic/Immunologic ?Negative Cardiovascular ?Negative Constitutional ?Negative Ear, Nose, Mouth & Throat ?Negative Endocrine ?Negative Eyes ?Negative Gastrointestinal ?Negative Genitourinary ?Negative Hemotologic/Lymphatic ?Negative Integumentary ?Negative Musculoskeletal ?Negative Neurological ?Negative Psychiatry ?Negative Respiratory ? ?Social ?  Never smoked  ? ?Medication ?Aspirin, Vitamin D3, Vitamin B-12, Furosemide, Atorvastatin, Olmesartan, Amlodipine,  ? ?Sx/Procedures ?Blood clot removed behind the retina,  ?Knee Replacement, Kidney stone removal, Seed inplant,  ? ?Drug Allergies  ? NKDA ? ?History & Physical: ?Heent: Cataract, Left Eye ?NECK: supple without bruits ?LUNGS: lungs clear to auscultation ?CV: regular rate and rhythm ?Abdomen: soft and non-tender ? ?Impression & Plan: ?Assessment: ?1.  NUCLEAR SCLEROSIS AGE RELATED; Both Eyes (H25.13) ?2.  DERMATOCHALASIS, no surgery; Right Upper Lid, Left Upper Lid (A45.364, W80.321) ?3.   BLEPHARITIS; Right Lower Lid, Left Upper Lid, Left Lower Lid, Right Upper Lid (H01.001, H01.002,H01.004,H01.005) ?4.  CONJUNCTIVOCHALASIS; Both Eyes (Y24.825) ?5.  VITREOUS DETACHMENT PVD; Right Eye [id number]) ?6.  ARMD WET; Right Eye Inactive neovascularization (U88.9169) ?7.  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 ?Plan: 1.  Cataract accounts for the patient's decreased vision. This visual impairment is not correctable with a tolerable change in glasses or contact lenses. Cataract surgery with an implantation of a new lens should significantly improve the visual and functional status of the patient. Discussed all risks, benefits, alternatives, and potential complications. Discussed the procedures and recovery. Patient desires to have surgery. A-scan ordered and performed today for intra-ocular lens calculations. The surgery will be performed in order to improve vision for driving, reading, and for eye examinations. Recommend phacoemulsification with intra-ocular lens. Recommend Dextenza for post-operative pain and inflammation. ?Left Eye worse - first. ?Dilates poorly - shugarcaine by protocol. ?Malyugin Ring. ?Omidira. ? ?2.  Asymptomatic, recommend observation for now. Findings, prognosis and treatment options reviewed. ? ?3.  Recommend regular lid cleaning. ? ?4.  Asymptomatic. ? ?5.  Old ?Asymptomatic. ?RD precautions given. Patient to call with increase in flashing lights/floaters/dark curtain. ?Symptomatic. ? ?6.  Under care of Dr. Baird Cancer. ? ?7.  Discussed diagnosis in detail with patient. ?

## 2021-10-21 DIAGNOSIS — H2512 Age-related nuclear cataract, left eye: Secondary | ICD-10-CM | POA: Diagnosis not present

## 2021-10-25 ENCOUNTER — Encounter (HOSPITAL_COMMUNITY): Payer: Self-pay | Admitting: Ophthalmology

## 2021-10-25 ENCOUNTER — Ambulatory Visit (HOSPITAL_COMMUNITY): Payer: Medicare Other | Admitting: Certified Registered"

## 2021-10-25 ENCOUNTER — Ambulatory Visit (HOSPITAL_BASED_OUTPATIENT_CLINIC_OR_DEPARTMENT_OTHER): Payer: Medicare Other | Admitting: Certified Registered"

## 2021-10-25 ENCOUNTER — Ambulatory Visit (HOSPITAL_COMMUNITY)
Admission: RE | Admit: 2021-10-25 | Discharge: 2021-10-25 | Disposition: A | Discharge status: Home / Self Care | Payer: Medicare Other | Attending: Ophthalmology | Admitting: Ophthalmology

## 2021-10-25 ENCOUNTER — Encounter (HOSPITAL_COMMUNITY)
Admission: RE | Disposition: A | Discharge status: Home / Self Care | Payer: Self-pay | Source: Home / Self Care | Attending: Ophthalmology

## 2021-10-25 DIAGNOSIS — E1122 Type 2 diabetes mellitus with diabetic chronic kidney disease: Secondary | ICD-10-CM | POA: Insufficient documentation

## 2021-10-25 DIAGNOSIS — I129 Hypertensive chronic kidney disease with stage 1 through stage 4 chronic kidney disease, or unspecified chronic kidney disease: Secondary | ICD-10-CM

## 2021-10-25 DIAGNOSIS — N182 Chronic kidney disease, stage 2 (mild): Secondary | ICD-10-CM | POA: Diagnosis not present

## 2021-10-25 DIAGNOSIS — N189 Chronic kidney disease, unspecified: Secondary | ICD-10-CM

## 2021-10-25 DIAGNOSIS — H2512 Age-related nuclear cataract, left eye: Secondary | ICD-10-CM | POA: Diagnosis not present

## 2021-10-25 DIAGNOSIS — Z79899 Other long term (current) drug therapy: Secondary | ICD-10-CM | POA: Insufficient documentation

## 2021-10-25 DIAGNOSIS — E1136 Type 2 diabetes mellitus with diabetic cataract: Secondary | ICD-10-CM | POA: Insufficient documentation

## 2021-10-25 LAB — GLUCOSE, CAPILLARY: Glucose-Capillary: 100 mg/dL — ABNORMAL HIGH (ref 70–99)

## 2021-10-25 SURGERY — PHACOEMULSIFICATION, CATARACT, WITH IOL INSERTION
Anesthesia: Monitor Anesthesia Care | Site: Eye | Laterality: Left

## 2021-10-25 MED ORDER — LIDOCAINE HCL 3.5 % OP GEL
1.0000 "application " | Freq: Once | OPHTHALMIC | Status: AC
  Administered 2021-10-25: 1 via OPHTHALMIC

## 2021-10-25 MED ORDER — TROPICAMIDE 1 % OP SOLN
1.0000 [drp] | OPHTHALMIC | Status: AC | PRN
  Administered 2021-10-25 (×3): 1 [drp] via OPHTHALMIC
  Filled 2021-10-25: qty 3

## 2021-10-25 MED ORDER — TETRACAINE HCL 0.5 % OP SOLN
1.0000 [drp] | OPHTHALMIC | Status: AC | PRN
  Administered 2021-10-25 (×3): 1 [drp] via OPHTHALMIC

## 2021-10-25 MED ORDER — EPINEPHRINE PF 1 MG/ML IJ SOLN
INTRAMUSCULAR | Status: AC
  Filled 2021-10-25: qty 1

## 2021-10-25 MED ORDER — EPINEPHRINE PF 1 MG/ML IJ SOLN
INTRAOCULAR | Status: DC | PRN
  Administered 2021-10-25: 500 mL

## 2021-10-25 MED ORDER — SODIUM HYALURONATE 23MG/ML IO SOSY
PREFILLED_SYRINGE | INTRAOCULAR | Status: DC | PRN
  Administered 2021-10-25: 0.6 mL via INTRAOCULAR

## 2021-10-25 MED ORDER — NEOMYCIN-POLYMYXIN-DEXAMETH 3.5-10000-0.1 OP SUSP
OPHTHALMIC | Status: DC | PRN
  Administered 2021-10-25: 2 [drp] via OPHTHALMIC

## 2021-10-25 MED ORDER — POVIDONE-IODINE 5 % OP SOLN
OPHTHALMIC | Status: DC | PRN
  Administered 2021-10-25: 1 via OPHTHALMIC

## 2021-10-25 MED ORDER — STERILE WATER FOR IRRIGATION IR SOLN
Status: DC | PRN
  Administered 2021-10-25: 250 mL

## 2021-10-25 MED ORDER — SODIUM HYALURONATE 10 MG/ML IO SOLUTION
PREFILLED_SYRINGE | INTRAOCULAR | Status: DC | PRN
  Administered 2021-10-25: 0.85 mL via INTRAOCULAR

## 2021-10-25 MED ORDER — BSS IO SOLN
INTRAOCULAR | Status: DC | PRN
  Administered 2021-10-25: 15 mL via INTRAOCULAR

## 2021-10-25 MED ORDER — LIDOCAINE HCL (PF) 1 % IJ SOLN
INTRAOCULAR | Status: DC | PRN
  Administered 2021-10-25: 1 mL via OPHTHALMIC

## 2021-10-25 MED ORDER — PHENYLEPHRINE HCL 2.5 % OP SOLN
1.0000 [drp] | OPHTHALMIC | Status: AC | PRN
  Administered 2021-10-25 (×3): 1 [drp] via OPHTHALMIC

## 2021-10-25 MED ORDER — PHENYLEPHRINE-KETOROLAC 1-0.3 % IO SOLN
INTRAOCULAR | Status: AC
  Filled 2021-10-25: qty 4

## 2021-10-25 SURGICAL SUPPLY — 14 items
CATARACT SUITE SIGHTPATH (MISCELLANEOUS) ×2 IMPLANT
CLOTH BEACON ORANGE TIMEOUT ST (SAFETY) ×3 IMPLANT
EYE SHIELD UNIVERSAL CLEAR (GAUZE/BANDAGES/DRESSINGS) ×1 IMPLANT
FEE CATARACT SUITE SIGHTPATH (MISCELLANEOUS) ×2 IMPLANT
GLOVE SURG UNDER POLY LF SZ7 (GLOVE) ×2 IMPLANT
LENS IOL RAYNER 19.5 (Intraocular Lens) ×2 IMPLANT
LENS IOL RAYONE EMV 19.5 (Intraocular Lens) IMPLANT
NDL HYPO 18GX1.5 BLUNT FILL (NEEDLE) ×2 IMPLANT
NEEDLE HYPO 18GX1.5 BLUNT FILL (NEEDLE) ×2 IMPLANT
PAD ARMBOARD 7.5X6 YLW CONV (MISCELLANEOUS) ×3 IMPLANT
SYR TB 1ML LL NO SAFETY (SYRINGE) ×3 IMPLANT
TAPE SURG TRANSPORE 1 IN (GAUZE/BANDAGES/DRESSINGS) IMPLANT
TAPE SURGICAL TRANSPORE 1 IN (GAUZE/BANDAGES/DRESSINGS) ×2
WATER STERILE IRR 250ML POUR (IV SOLUTION) ×3 IMPLANT

## 2021-10-25 NOTE — Anesthesia Preprocedure Evaluation (Signed)
Anesthesia Evaluation  ?Patient identified by MRN, date of birth, ID band ?Patient awake ? ? ? ?Reviewed: ?Allergy & Precautions, H&P , NPO status , Patient's Chart, lab work & pertinent test results, reviewed documented beta blocker date and time  ? ?Airway ?Mallampati: II ? ?TM Distance: >3 FB ?Neck ROM: full ? ? ? Dental ?no notable dental hx. ? ?  ?Pulmonary ?neg pulmonary ROS,  ?  ?Pulmonary exam normal ?breath sounds clear to auscultation ? ? ? ? ? ? Cardiovascular ?Exercise Tolerance: Good ?hypertension, + Peripheral Vascular Disease and + DOE  ? ?Rhythm:regular Rate:Normal ? ? ?  ?Neuro/Psych ?TIAnegative psych ROS  ? GI/Hepatic ?negative GI ROS, Neg liver ROS,   ?Endo/Other  ?negative endocrine ROSdiabetes ? Renal/GU ?CRFRenal disease  ?negative genitourinary ?  ?Musculoskeletal ? ? Abdominal ?  ?Peds ? Hematology ? ?(+) Blood dyscrasia, anemia ,   ?Anesthesia Other Findings ? ? Reproductive/Obstetrics ?negative OB ROS ? ?  ? ? ? ? ? ? ? ? ? ? ? ? ? ?  ?  ? ? ? ? ? ? ? ? ?Anesthesia Physical ?Anesthesia Plan ? ?ASA: 3 ? ?Anesthesia Plan: MAC  ? ?Post-op Pain Management:   ? ?Induction:  ? ?PONV Risk Score and Plan:  ? ?Airway Management Planned:  ? ?Additional Equipment:  ? ?Intra-op Plan:  ? ?Post-operative Plan:  ? ?Informed Consent: I have reviewed the patients History and Physical, chart, labs and discussed the procedure including the risks, benefits and alternatives for the proposed anesthesia with the patient or authorized representative who has indicated his/her understanding and acceptance.  ? ? ? ?Dental Advisory Given ? ?Plan Discussed with: CRNA ? ?Anesthesia Plan Comments:   ? ? ? ? ? ? ?Anesthesia Quick Evaluation ? ?

## 2021-10-25 NOTE — Interval H&P Note (Signed)
History and Physical Interval Note: ? ?10/25/2021 ?12:45 PM ? ?Besnik [facility].  has presented today for surgery, with the diagnosis of nuclear sclerosis age related; left.  The various methods of treatment have been discussed with the patient and family. After consideration of risks, benefits and other options for treatment, the patient has consented to  Procedure(s) with comments: ?CATARACT EXTRACTION PHACO AND INTRAOCULAR LENS PLACEMENT (IOC) (Left) - CDE: as a surgical intervention.  The patient's history has been reviewed, patient examined, no change in status, stable for surgery.  I have reviewed the patient's chart and labs.  Questions were answered to the patient's satisfaction.   ? ? ?Baruch Goldmann ? ? ?

## 2021-10-25 NOTE — Transfer of Care (Signed)
Immediate Anesthesia Transfer of Care Note ? ?Patient: Ricardo Mayer. ? ?Procedure(s) Performed: CATARACT EXTRACTION PHACO AND INTRAOCULAR LENS PLACEMENT (IOC) (Left: Eye) ? ?Patient Location: PACU ? ?Anesthesia Type:MAC ? ?Level of Consciousness: awake, alert , oriented and patient cooperative ? ?Airway & Oxygen Therapy: Patient Spontanous Breathing ? ?Post-op Assessment: Report given to RN, Post -op Vital signs reviewed and stable and Patient moving all extremities X 4 ? ?Post vital signs: Reviewed and stable ? ?Last Vitals:  ?Vitals Value Taken Time  ?BP    ?Temp    ?Pulse    ?Resp    ?SpO2    ? ? ?Last Pain:  ?Vitals:  ? 10/25/21 1212  ?TempSrc: Oral  ?PainSc: 0-No pain  ?   ? ?  ? ?Complications: No notable events documented. ?

## 2021-10-25 NOTE — Discharge Instructions (Signed)
Please discharge patient when stable, will follow up today with Dr. Redina Zeller at the [facility] office immediately following discharge.  Leave shield in place until visit.  All paperwork with discharge instructions will be given at the office.  [facility] [place] [facility] Address:  [address]  [place], [place] [address]  

## 2021-10-25 NOTE — Op Note (Signed)
Date of procedure: 10/25/21 ? ?Pre-operative diagnosis: Visually significant age-related nuclear cataract, Left Eye (H25.12) ? ?Post-operative diagnosis: Visually significant age-related nuclear cataract, Left Eye ? ?Procedure: Removal of cataract via phacoemulsification and insertion of intra-ocular lens Rayner [id number] +19.5D into the capsular bag of the Left Eye ? ?Attending surgeon: Gerda Diss. Marisa Hua, MD, MA ? ?Anesthesia: MAC, Topical Ak[place] ? ?Complications: None ? ?Estimated Blood Loss: <75m (minimal) ? ?Specimens: None ? ?Implants: As above ? ?Indications:  Visually significant age-related cataract, Left Eye ? ?Procedure:  ?The patient was seen and identified in the pre-operative area. The operative eye was identified and dilated.  The operative eye was marked.  Topical anesthesia was administered to the operative eye.    ? ?The patient was then to the operative suite and placed in the supine position.  A timeout was performed confirming the patient, procedure to be performed, and all other relevant information.   The patient's face was prepped and draped in the usual fashion for intra-ocular surgery.  A lid speculum was placed into the operative eye and the surgical microscope moved into place and focused.  An inferotemporal paracentesis was created using a 20 gauge paracentesis blade.  Shugarcaine was injected into the anterior chamber.  Viscoelastic was injected into the anterior chamber.  A temporal clear-corneal main wound incision was created using a 2.428mmicrokeratome.  A continuous curvilinear capsulorrhexis was initiated using an irrigating cystitome and completed using capsulorrhexis forceps.  Hydrodissection and hydrodeliniation were performed.  Viscoelastic was injected into the anterior chamber.  A phacoemulsification handpiece and a chopper as a second instrument were used to remove the nucleus and epinucleus. The irrigation/aspiration handpiece was used to remove any remaining cortical material.   ? ?The capsular bag was reinflated with viscoelastic, checked, and found to be intact.  The intraocular lens was inserted into the capsular bag.  The irrigation/aspiration handpiece was used to remove any remaining viscoelastic.  The clear corneal wound and paracentesis wounds were then hydrated and checked with Weck-Cels to be watertight.  The lid-speculum and drape was removed, and the patient's face was cleaned with a wet and dry 4x4.  Maxitrol was instilled in the eye. A clear shield was taped over the eye. The patient was taken to the post-operative care unit in good condition, having tolerated the procedure well. ? ?Post-Op Instructions: [facility] and will receive all other orders and instructions. ? ?

## 2021-10-25 NOTE — Anesthesia Postprocedure Evaluation (Signed)
Anesthesia Post Note ? ?Patient: Sophie Quiles. ? ?Procedure(s) Performed: CATARACT EXTRACTION PHACO AND INTRAOCULAR LENS PLACEMENT (IOC) (Left: Eye) ? ?Patient location during evaluation: Phase II ?Anesthesia Type: MAC ?Level of consciousness: awake ?Pain management: pain level controlled ?Vital Signs Assessment: post-procedure vital signs reviewed and stable ?Respiratory status: spontaneous breathing and respiratory function stable ?Cardiovascular status: blood pressure returned to baseline and stable ?Postop Assessment: no headache and no apparent nausea or vomiting ?Anesthetic complications: no ?Comments: Late entry ? ? ?No notable events documented. ? ? ?Last Vitals:  ?Vitals:  ? 10/25/21 1227 10/25/21 1310  ?BP: (P) 115/71 140/73  ?Pulse:  98  ?Resp:  16  ?Temp:    ?SpO2:  98%  ?  ?Last Pain:  ?Vitals:  ? 10/25/21 1310  ?TempSrc:   ?PainSc: 0-No pain  ? ? ?  ?  ?  ?  ?  ?  ? ?Louann Sjogren ? ? ? ? ?

## 2021-10-27 ENCOUNTER — Encounter (HOSPITAL_COMMUNITY): Payer: Self-pay | Admitting: Ophthalmology

## 2021-11-03 ENCOUNTER — Encounter (HOSPITAL_COMMUNITY): Admission: RE | Admit: 2021-11-03 | Payer: Medicare Other | Source: Ambulatory Visit

## 2021-11-08 ENCOUNTER — Ambulatory Visit (HOSPITAL_COMMUNITY): Admission: RE | Admit: 2021-11-08 | Payer: Medicare Other | Source: Home / Self Care | Admitting: Ophthalmology

## 2021-11-08 ENCOUNTER — Encounter (HOSPITAL_COMMUNITY): Admission: RE | Payer: Self-pay | Source: Home / Self Care

## 2021-11-08 SURGERY — PHACOEMULSIFICATION, CATARACT, WITH IOL INSERTION
Anesthesia: Monitor Anesthesia Care | Laterality: Right

## 2021-11-25 DIAGNOSIS — H43811 Vitreous degeneration, right eye: Secondary | ICD-10-CM | POA: Diagnosis not present

## 2021-11-25 DIAGNOSIS — H25811 Combined forms of age-related cataract, right eye: Secondary | ICD-10-CM | POA: Diagnosis not present

## 2021-11-25 DIAGNOSIS — H353221 Exudative age-related macular degeneration, left eye, with active choroidal neovascularization: Secondary | ICD-10-CM | POA: Diagnosis not present

## 2021-11-25 DIAGNOSIS — Z961 Presence of intraocular lens: Secondary | ICD-10-CM | POA: Diagnosis not present

## 2021-11-25 DIAGNOSIS — H4312 Vitreous hemorrhage, left eye: Secondary | ICD-10-CM | POA: Diagnosis not present

## 2021-11-25 DIAGNOSIS — H353222 Exudative age-related macular degeneration, left eye, with inactive choroidal neovascularization: Secondary | ICD-10-CM | POA: Diagnosis not present

## 2021-11-25 DIAGNOSIS — H35373 Puckering of macula, bilateral: Secondary | ICD-10-CM | POA: Diagnosis not present

## 2021-11-25 LAB — HM DIABETES EYE EXAM

## 2022-01-21 DIAGNOSIS — H353212 Exudative age-related macular degeneration, right eye, with inactive choroidal neovascularization: Secondary | ICD-10-CM | POA: Diagnosis not present

## 2022-01-21 DIAGNOSIS — H35373 Puckering of macula, bilateral: Secondary | ICD-10-CM | POA: Diagnosis not present

## 2022-01-21 DIAGNOSIS — H353221 Exudative age-related macular degeneration, left eye, with active choroidal neovascularization: Secondary | ICD-10-CM | POA: Diagnosis not present

## 2022-01-21 DIAGNOSIS — H43811 Vitreous degeneration, right eye: Secondary | ICD-10-CM | POA: Diagnosis not present

## 2022-01-28 ENCOUNTER — Ambulatory Visit: Payer: Medicare Other | Admitting: Family Medicine

## 2022-01-31 ENCOUNTER — Ambulatory Visit (INDEPENDENT_AMBULATORY_CARE_PROVIDER_SITE_OTHER): Payer: Medicare Other | Admitting: Family Medicine

## 2022-01-31 ENCOUNTER — Encounter: Payer: Self-pay | Admitting: Family Medicine

## 2022-01-31 VITALS — BP 141/77 | HR 81 | Temp 97.6°F | Ht 67.0 in | Wt 225.1 lb

## 2022-01-31 DIAGNOSIS — Z8546 Personal history of malignant neoplasm of prostate: Secondary | ICD-10-CM

## 2022-01-31 DIAGNOSIS — R413 Other amnesia: Secondary | ICD-10-CM

## 2022-01-31 DIAGNOSIS — N184 Chronic kidney disease, stage 4 (severe): Secondary | ICD-10-CM

## 2022-01-31 DIAGNOSIS — R351 Nocturia: Secondary | ICD-10-CM

## 2022-01-31 DIAGNOSIS — E211 Secondary hyperparathyroidism, not elsewhere classified: Secondary | ICD-10-CM

## 2022-01-31 DIAGNOSIS — E1159 Type 2 diabetes mellitus with other circulatory complications: Secondary | ICD-10-CM | POA: Diagnosis not present

## 2022-01-31 LAB — BAYER DCA HB A1C WAIVED: HB A1C (BAYER DCA - WAIVED): 5.3 % (ref 4.8–5.6)

## 2022-01-31 MED ORDER — MIRABEGRON ER 25 MG PO TB24: 25.0000 mg | ORAL_TABLET | Freq: Every day | ORAL | 0 refills | Status: DC

## 2022-01-31 MED ORDER — FUROSEMIDE 20 MG PO TABS: ORAL_TABLET | ORAL | 0 refills | Status: DC

## 2022-01-31 NOTE — Patient Instructions (Signed)
Myrbetriq samples provided for overactive bladder.  Take 1 tablet daily to help with urinary symptoms. Will call with labs tomorrow. Your memory test was NORMAL. You do NOT have dementia.  Memory Compensation Strategies  Use "WARM" strategy.  W= write it down  A= associate it  R= repeat it  M= make a mental note  2.   You can keep a Social worker.  Use a 3-ring notebook with sections for the following: calendar, important names and phone numbers,  medications, doctors' names/phone numbers, lists/reminders, and a section to journal what you did  each day.   3.    Use a calendar to write appointments down.  4.    Write yourself a schedule for the day.  This can be placed on the calendar or in a separate section of the Memory Notebook.  Keeping a  regular schedule can help memory.  5.    Use medication organizer with sections for each day or morning/evening pills.  You may need help loading it  6.    Keep a basket, or pegboard by the door.  Place items that you need to take out with you in the basket or on the pegboard.  You may also want to  include a message board for reminders.  7.    Use sticky notes.  Place sticky notes with reminders in a place where the task is performed.  For example: " turn off the  stove" placed by the stove, "lock the door" placed on the door at eye level, " take your medications" on  the bathroom mirror or by the place where you normally take your medications.  8.    Use alarms/timers.  Use while cooking to remind yourself to check on food or as a reminder to take your medicine, or as a  reminder to make a call, or as a reminder to perform another task, etc.

## 2022-01-31 NOTE — Progress Notes (Signed)
Subjective: CC:DM PCP: [name], DO Ricardo Mayer. is a 81 y.o. male presenting to clinic today for:  1. Type 2 Diabetes with hypertension, hyperlipidemia, associated with CKD:  Has diet-controlled diabetes.  Compliant with Norvasc, Lipitor and Benicar.  No chest pain, shortness of breath, visual disturbance.  Last eye exam: UTD Last foot exam: UTD Last A1c:  Lab Results  Component Value Date   HGBA1C 5.8 (H) 10/01/2021   Nephropathy screen indicated?: UTD Last flu, zoster and/or pneumovax:  Immunization History  Administered Date(s) Administered   Fluad Quad(high Dose 65+) 05/16/2019, 05/13/2020, 05/17/2021   Influenza Whole 05/20/2016   Influenza-Unspecified 05/15/2013, 05/15/2014, 06/09/2015   Moderna Sars-Covid-2 Vaccination 08/27/2019, 10/07/2019   Pneumococcal Conjugate-13 10/09/2013   Pneumococcal Polysaccharide-23 07/05/2018   Tdap 04/07/2011    ROS:  He got lightheaded in the lab today but did not eat anything this morning.  He notes this always happens whenever he sees needles or get blood draws.  2.  Nocturia Patient reports several episodes of urination per night.  He does have a history of prostate cancer that was treated with seed placement many years ago.  He has not seen his urologist since that time as his PSA had remained stable.  Not currently treated with any bladder medications but would be interested in this, as the frequency is interfering with his sleep.  No dysuria, hematuria, flank pain.  3.  Memory concern Patient reports concerns about his short-term memory.  He notes that he sometimes forgets words of things and sometimes just simply forgets what he was about to do.  He denies ever getting lost or having difficulty managing finances.  No forgetting names of people that he loves or cares about.   ROS: Per HPI  No Known Allergies Past Medical History:  Diagnosis Date   Adenomatous polyp of colon    Arthritis    bilateral  knees   Atypical mole 2016   left shoulder sever wider shave   BCC (basal cell carcinoma of skin) 2011   left neck tx mohs   BCC (basal cell carcinoma of skin) 2015   left sideburn tx with bx   BCC (basal cell carcinoma of skin) 2015   right post shoulder tx with bx   BCC (basal cell carcinoma of skin) 2016   left chest tx with bx   BCC (basal cell carcinoma of skin) 2017   left cheek cx3 65f   CKD (chronic kidney disease), stage II    Dr. 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 MLaurance FlattenPCP   History of basal cell cancer 2011   right inner eye tx mohs   History of DVT of lower extremity    2011--  LEFT LEG   History of kidney stones    History of melanoma excision    RIGHT ARM   Hyperlipidemia    Hypertension    Impaired hearing    hearing aids bilateral   Macular degeneration    [place]   arm and back per patient  -Melanoma   Near syncope 08/25/2015   Dr. VRoland Rackonce"found blood clots in lung"   Nocturia    Prostate cancer ([facility] 2014   Dr. WCherylann Parr "seed implant" '14   [facility]    recent pcp visit dr mLaurance Flatten-  ordered betadine wet/dry  dsg daily (03-18-2013)  [place] IN 1 WEEK    Current Outpatient Medications:    amLODipine (NORVASC) 5 MG tablet, Take 1 tablet (5 mg total) by mouth daily., Disp: 90 tablet, Rfl: 3   aspirin 325 MG tablet, Take 1 tablet (325 mg total) by mouth daily., Disp: 90 tablet, Rfl: 3   atorvastatin (LIPITOR) 10 MG tablet, Take 1 tablet (10 mg total) by mouth daily., Disp: 90 tablet, Rfl: 3   cholecalciferol (VITAMIN D3) 25 MCG (1000 UT) tablet, Take 1,000 Units by mouth daily., Disp: , Rfl:    furosemide (LASIX) 20 MG tablet, TAKE 1 AND 1/2 TABLETS BY  MOUTH DAILY AS NEEDED, Disp: 135 tablet, Rfl: 0   glucose blood (ONE TOUCH ULTRA TEST) test strip, Test up to tid. DX E11.9  (Patient not taking: Reported on 09/28/2021), Disp: 100 each, Rfl: 2   olmesartan (BENICAR) 40 MG tablet, Take 1 tablet (40 mg total) by mouth daily., Disp: 90 tablet, Rfl: 3   predniSONE (STERAPRED UNI-PAK 48 TAB) 5 MG (48) TBPK tablet, Take 10 mg by mouth 2 (two) times daily., Disp: , Rfl:  Social History   Socioeconomic History   Marital status: Married    Spouse name: Janelle   Number of children: 3   Years of education: master's   Highest education level: Master's degree (e.g., MA, MS, MEng, MEd, MSW, MBA)  Occupational History   Occupation: Retail buyer    Comment: retired  Tobacco Use   Smoking status: Never   Smokeless tobacco: Never  Vaping Use   Vaping Use: Never used  Substance and Sexual Activity   Alcohol use: No   Drug use: No   Sexual activity: Not Currently  Other Topics Concern   Not on file  Social History Narrative   Patient drinks 1 cup of coffee daily.   Patient is right handed.    Patient resides locally with his wife.  He has 3 sons, 2 sons reside in [place] where the other one resides in [place].  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   Social Determinants of Health   Financial Resource Strain: Low Risk  (01/15/2020)   Overall Financial Resource Strain (CARDIA)    Difficulty of Paying Living Expenses: Not hard at all  Food Insecurity: No Food Insecurity (01/15/2020)   Hunger Vital Sign    Worried About Running Out of Food in the Last Year: Never true    Ran Out of Food in the Last Year: Never true  Transportation Needs: No Transportation Needs (01/15/2020)   PRAPARE - Hydrologist (Medical): No    Lack of Transportation (Non-Medical): No  Physical Activity: Insufficiently Active (01/15/2020)   Exercise Vital Sign    Days of Exercise per Week: 7 days    Minutes of Exercise  per Session: 20 min  Stress: No Stress Concern Present (01/15/2020)   [place]    Feeling of Stress : Not at all  Social Connections: [place] (01/15/2020)   Social Connection and Isolation Panel [NHANES]    Frequency of Communication with Friends and Family: More than three times a week    Frequency of Social Gatherings with Friends and Family: More than three times a week    Attends Religious  Services: More than 4 times per year    Active Member of Clubs or Organizations: Yes    Attends Archivist Meetings: More than 4 times per year    Marital Status: Married  Human resources officer Violence: Not At Risk (01/15/2020)   Humiliation, Afraid, Rape, and Kick questionnaire    Fear of Current or Ex-Partner: No    Emotionally Abused: No    Physically Abused: No    Sexually Abused: No   Family History  Problem Relation Age of Onset   Breast cancer Mother    Stroke Mother    Hypertension Mother    Lung cancer Father        smoked   Ulcers Father    Heart disease Brother 90       stent   Esophageal cancer Brother    Cancer Paternal Uncle        prostate   Cancer Paternal Grandfather        prostate   Heart attack Neg Hx    Colon cancer Neg Hx    Colon polyps Neg Hx    Rectal cancer Neg Hx    Stomach cancer Neg Hx     Objective: Office vital signs reviewed. BP (!) 141/77   Pulse 81   Temp 97.6 F (36.4 C)   Ht '5\' 7"'$  (1.702 m)   Wt 225 lb 1.4 oz (102.1 kg)   SpO2 99%   BMI 35.25 kg/m   Physical Examination:  General: Awake, alert, well nourished, No acute distress HEENT: Sclera white.  Somewhat pale in the face Cardio: regular rate and rhythm, S1S2 heard, no murmurs appreciated Pulm: clear to auscultation bilaterally, no wheezes, rhonchi or rales; normal work of breathing on room air MSK: Slightly shuffled gait and hunched station but ambulating slowly Independently Neuro: see MMSE     01/31/2022   10:24 AM 07/05/2018    9:39 AM 08/26/2015    3:11 PM  MMSE - Mini Mental State Exam  Orientation to time '5 5 5  '$ Orientation to Place '5 5 5  '$ Registration '3 3 3  '$ Attention/ Calculation '5 5 5  '$ Recall '2 1 3  '$ Language- name 2 objects '2 2 2  '$ Language- repeat '1 1 1  '$ Language- follow 3 step command '3 3 3  '$ Language- read & follow direction '1 1 1  '$ Write a sentence '1 1 1  '$ Copy design '1 1 1  '$ Total score '29 28 30    '$ Assessment/ Plan: 81 y.o. male   Type 2 diabetes mellitus with vascular disease ([place]) - Plan: Bayer DCA Hb A1c Waived  Chronic kidney disease (CKD), stage IV (severe) ([place]) - Plan: Renal Function Panel  Hyperparathyroidism , secondary, non-renal ([place]) - Plan: T4, free, TSH  History of prostate cancer  Nocturia - Plan: mirabegron ER (MYRBETRIQ) 25 MG TB24 tablet  Memory changes  Sugar remains under excellent control.  Renal function collected.  PSA added given history of nocturia and prostate cancer status post seed treatment.  Pending PSA result, may need to consider referral back to urology.  In the meantime I have given him 4 weeks worth of Myrbetriq samples to trial.  He will contact me if he finds these to be helpful.  MMSE was totally normal.  No evidence of dementia  He may follow-up in 4 months, sooner if needed  Orders Placed This Encounter  Procedures   Bayer DCA Hb A1c Waived   T4, free   TSH   Renal  Function Panel   No orders of the defined types were placed in this encounter.    [name], DO [place] [phone]

## 2022-02-01 LAB — T4, FREE: Free T4: 1.08 ng/dL (ref 0.82–1.77)

## 2022-02-01 LAB — RENAL FUNCTION PANEL
Albumin: 3.8 g/dL (ref 3.7–4.7)
BUN/Creatinine Ratio: 15 (ref 10–24)
BUN: 43 mg/dL — ABNORMAL HIGH (ref 8–27)
CO2: 16 mmol/L — ABNORMAL LOW (ref 20–29)
Calcium: 8.8 mg/dL (ref 8.6–10.2)
Chloride: 111 mmol/L — ABNORMAL HIGH (ref 96–106)
Creatinine, Ser: 2.84 mg/dL — ABNORMAL HIGH (ref 0.76–1.27)
Glucose: 94 mg/dL (ref 70–99)
Phosphorus: 3.9 mg/dL (ref 2.8–4.1)
Potassium: 5.2 mmol/L (ref 3.5–5.2)
Sodium: 140 mmol/L (ref 134–144)
eGFR: 22 mL/min/{1.73_m2} — ABNORMAL LOW (ref >59)

## 2022-02-01 LAB — TSH: TSH: 1.65 u[IU]/mL (ref 0.450–4.500)

## 2022-02-01 LAB — PSA: Prostate Specific Ag, Serum: <0.1 ng/mL (ref 0.0–4.0)

## 2022-02-09 ENCOUNTER — Telehealth: Payer: Self-pay | Admitting: Family Medicine

## 2022-02-09 NOTE — Telephone Encounter (Signed)
Pt called - aware to home test at first s/s = and call for appt for TELE

## 2022-02-23 ENCOUNTER — Ambulatory Visit: Payer: Self-pay | Admitting: *Deleted

## 2022-02-23 NOTE — Chronic Care Management (AMB) (Signed)
  Chronic Care Management   Note  02/23/2022 Name: Ricardo Mayer. MRN: [id number] DOB: November 14, 1940   Patient is stable from RN Care Management perspective or has not recently engaged with the [place]. I am removing RN Care Manager from Care Team and closing [place]. If patient is currently engaged with another CCM team member I will forward this encounter to inform them of my case closure. Patient may be eligible for re-engagement with RN Care Manager in the future if necessary and can discuss this with their PCP.  Chong Sicilian, BSN, RN-BC Embedded Chronic Care Manager [facility] [place] [facility] Direct Dial: [phone]

## 2022-02-23 NOTE — Patient Instructions (Signed)
Ricardo Mayer.  I have enjoyed working with you through the Chronic Care Management Program at [facility]. Due to program changes I am removing myself from your care team because you've either met our goals, your conditions are stable and no longer require care management, or we haven't engaged within the past 6 months. If you are currently active with another CCM Team Member, you will remain active with them unless they reach out to you with additional information. If you feel that you need RN Care Management services in the future, please talk with your primary care provider to discuss re-engagement with the RN Care Manager that will be assigned to [name]. This does not affect your status as a patient at [name].   Thank you for allowing me to participate in your your healthcare journey.  Chong Sicilian, BSN, RN-BC Embedded Chronic Care Manager [facility] [place] [facility] Direct Dial: [phone]

## 2022-03-08 DIAGNOSIS — H353221 Exudative age-related macular degeneration, left eye, with active choroidal neovascularization: Secondary | ICD-10-CM | POA: Diagnosis not present

## 2022-03-08 DIAGNOSIS — H35373 Puckering of macula, bilateral: Secondary | ICD-10-CM | POA: Diagnosis not present

## 2022-03-08 DIAGNOSIS — H43811 Vitreous degeneration, right eye: Secondary | ICD-10-CM | POA: Diagnosis not present

## 2022-03-14 ENCOUNTER — Other Ambulatory Visit: Payer: Medicare Other

## 2022-03-14 DIAGNOSIS — N184 Chronic kidney disease, stage 4 (severe): Secondary | ICD-10-CM | POA: Diagnosis not present

## 2022-03-23 DIAGNOSIS — N184 Chronic kidney disease, stage 4 (severe): Secondary | ICD-10-CM | POA: Diagnosis not present

## 2022-03-23 DIAGNOSIS — D631 Anemia in chronic kidney disease: Secondary | ICD-10-CM | POA: Diagnosis not present

## 2022-03-23 DIAGNOSIS — N2581 Secondary hyperparathyroidism of renal origin: Secondary | ICD-10-CM | POA: Diagnosis not present

## 2022-03-23 DIAGNOSIS — I129 Hypertensive chronic kidney disease with stage 1 through stage 4 chronic kidney disease, or unspecified chronic kidney disease: Secondary | ICD-10-CM | POA: Diagnosis not present

## 2022-03-27 ENCOUNTER — Other Ambulatory Visit: Payer: Self-pay | Admitting: Family Medicine

## 2022-04-06 DIAGNOSIS — H43811 Vitreous degeneration, right eye: Secondary | ICD-10-CM | POA: Diagnosis not present

## 2022-04-06 DIAGNOSIS — H353231 Exudative age-related macular degeneration, bilateral, with active choroidal neovascularization: Secondary | ICD-10-CM | POA: Diagnosis not present

## 2022-04-06 DIAGNOSIS — H2511 Age-related nuclear cataract, right eye: Secondary | ICD-10-CM | POA: Diagnosis not present

## 2022-04-06 DIAGNOSIS — H35373 Puckering of macula, bilateral: Secondary | ICD-10-CM | POA: Diagnosis not present

## 2022-05-11 DIAGNOSIS — H43811 Vitreous degeneration, right eye: Secondary | ICD-10-CM | POA: Diagnosis not present

## 2022-05-11 DIAGNOSIS — H353231 Exudative age-related macular degeneration, bilateral, with active choroidal neovascularization: Secondary | ICD-10-CM | POA: Diagnosis not present

## 2022-05-11 DIAGNOSIS — H3561 Retinal hemorrhage, right eye: Secondary | ICD-10-CM | POA: Diagnosis not present

## 2022-05-11 DIAGNOSIS — H35373 Puckering of macula, bilateral: Secondary | ICD-10-CM | POA: Diagnosis not present

## 2022-05-16 ENCOUNTER — Other Ambulatory Visit: Payer: Medicare Other

## 2022-05-16 DIAGNOSIS — N184 Chronic kidney disease, stage 4 (severe): Secondary | ICD-10-CM | POA: Diagnosis not present

## 2022-05-25 DIAGNOSIS — D631 Anemia in chronic kidney disease: Secondary | ICD-10-CM | POA: Diagnosis not present

## 2022-05-25 DIAGNOSIS — N2581 Secondary hyperparathyroidism of renal origin: Secondary | ICD-10-CM | POA: Diagnosis not present

## 2022-05-25 DIAGNOSIS — I129 Hypertensive chronic kidney disease with stage 1 through stage 4 chronic kidney disease, or unspecified chronic kidney disease: Secondary | ICD-10-CM | POA: Diagnosis not present

## 2022-05-25 DIAGNOSIS — N184 Chronic kidney disease, stage 4 (severe): Secondary | ICD-10-CM | POA: Diagnosis not present

## 2022-06-03 ENCOUNTER — Ambulatory Visit (INDEPENDENT_AMBULATORY_CARE_PROVIDER_SITE_OTHER): Payer: Medicare Other | Admitting: Family Medicine

## 2022-06-03 ENCOUNTER — Encounter: Payer: Self-pay | Admitting: Family Medicine

## 2022-06-03 VITALS — BP 157/83 | HR 90 | Temp 98.1°F | Ht 67.0 in | Wt 227.4 lb

## 2022-06-03 DIAGNOSIS — E1159 Type 2 diabetes mellitus with other circulatory complications: Secondary | ICD-10-CM

## 2022-06-03 DIAGNOSIS — E1169 Type 2 diabetes mellitus with other specified complication: Secondary | ICD-10-CM

## 2022-06-03 DIAGNOSIS — E785 Hyperlipidemia, unspecified: Secondary | ICD-10-CM

## 2022-06-03 DIAGNOSIS — N184 Chronic kidney disease, stage 4 (severe): Secondary | ICD-10-CM | POA: Diagnosis not present

## 2022-06-03 DIAGNOSIS — I152 Hypertension secondary to endocrine disorders: Secondary | ICD-10-CM

## 2022-06-03 DIAGNOSIS — Z23 Encounter for immunization: Secondary | ICD-10-CM | POA: Diagnosis not present

## 2022-06-03 LAB — BAYER DCA HB A1C WAIVED: HB A1C (BAYER DCA - WAIVED): 5.5 % (ref 4.8–5.6)

## 2022-06-03 NOTE — Progress Notes (Signed)
Subjective: CC:DM PCP: [name], DO WLN:[id number] D Ricardo Mayer. is a 81 y.o. male presenting to clinic today for:  1. Type 2 Diabetes with hypertension, hyperlipidemia w. CKD4:  Last visit with nephrologist was 03/2022.  GFR 23 at that time. Has required [place] for hyperkalemia.  Just finished a regimen as prescribed by his nephrologist and now taking sodium bicarb daily in efforts to keep potassium down.  He does also take Lasix 30 mg daily, olmesartan, Norvasc and lipitor.  Sugar is diet controlled.  Has never been on [place] for treatment.  He brings in blood pressure log which are running around 130-140s over 70s at home.   Last eye exam: UTD Last foot exam: UTD Last A1c:  Lab Results  Component Value Date   HGBA1C 5.3 01/31/2022   Nephropathy screen indicated?: has CKD4 Last flu, zoster and/or pneumovax:  Immunization History  Administered Date(s) Administered   Fluad Quad(high Dose 65+) 05/16/2019, 05/13/2020, 05/17/2021   Influenza Whole 05/20/2016   Influenza-Unspecified 05/15/2013, 05/15/2014, 06/09/2015   Moderna Sars-Covid-2 Vaccination 08/27/2019, 10/07/2019   Pneumococcal Conjugate-13 10/09/2013   Pneumococcal Polysaccharide-23 07/05/2018   Tdap 04/07/2011    ROS: No chest pain, shortness of breath, falls or dizziness reported    ROS: Per HPI  No Known Allergies Past Medical History:  Diagnosis Date   Adenomatous polyp of colon    Arthritis    bilateral knees   Atypical mole 2016   left shoulder sever wider shave   BCC (basal cell carcinoma of skin) 2011   left neck tx mohs   BCC (basal cell carcinoma of skin) 2015   left sideburn tx with bx   BCC (basal cell carcinoma of skin) 2015   right post shoulder tx with bx   BCC (basal cell carcinoma of skin) 2016   left chest tx with bx   BCC (basal cell carcinoma of skin) 2017   left cheek cx3 76f   CKD (chronic kidney disease), stage II    Dr. PEsmeralda Links"stable last 3 yrs"   Clotting  disorder ([facility]    PE lung with scar tissue    Diabetes mellitus without complication ([facility]    Dr. MLaurance FlattenPCP   History of basal cell cancer 2011   right inner eye tx mohs   History of DVT of lower extremity    2011--  LEFT LEG   History of kidney stones    History of melanoma excision    RIGHT ARM   Hyperlipidemia    Hypertension    Impaired hearing    hearing aids bilateral   Macular degeneration    Melan[place] ([facility]   arm and back per patient  -Melanoma   Near syncope 08/25/2015   Dr. VRoland Rackonce"found blood clots in lung"   Nocturia    Prostate cancer ([facility] 2014   Dr. WCherylann Parr "seed implant" '14   Ulcer of left lower leg ([facility]    recent pcp visit dr mLaurance Flatten-  ordered betadine wet/dry dsg daily (03-18-2013)  [place] 1 WEEK    Current Outpatient Medications:    amLODipine (NORVASC) 5 MG tablet, Take 1 tablet (5 mg total) by mouth daily., Disp: 90 tablet, Rfl: 3   aspirin 325 MG tablet, Take 1 tablet (325 mg total) by mouth daily., Disp: 90 tablet, Rfl: 3   atorvastatin (LIPITOR) 10 MG tablet, Take 1 tablet (10 mg total) by mouth daily., Disp: 90 tablet, Rfl: 3   cholecalciferol (VITAMIN D3)  25 MCG (1000 UT) tablet, Take 1,000 Units by mouth daily., Disp: , Rfl:    furosemide (LASIX) 20 MG tablet, Take 1.5 tablets (30 mg total) by mouth daily as needed (swelling)., Disp: 150 tablet, Rfl: 0   glucose blood (ONE TOUCH ULTRA TEST) test strip, Test up to tid. DX E11.9, Disp: 100 each, Rfl: 2   mirabegron ER (MYRBETRIQ) 25 MG TB24 tablet, Take 1 tablet (25 mg total) by mouth daily., Disp: 28 tablet, Rfl: 0   olmesartan (BENICAR) 40 MG tablet, Take 1 tablet (40 mg total) by mouth daily., Disp: 90 tablet, Rfl: 3   predniSONE (STERAPRED UNI-PAK 48 TAB) 5 MG (48) TBPK tablet, Take 10 mg by mouth 2 (two) times daily., Disp: , Rfl:  Social History   Socioeconomic History   Marital status: Married    Spouse name: Ricardo Mayer   Number of children: 3    Years of education: master's   Highest education level: Master's degree (e.g., MA, MS, MEng, MEd, MSW, MBA)  Occupational History   Occupation: Retail buyer    Comment: retired  Tobacco Use   Smoking status: Never   Smokeless tobacco: Never  Vaping Use   Vaping Use: Never used  Substance and Sexual Activity   Alcohol use: No   Drug use: No   Sexual activity: Not Currently  Other Topics Concern   Not on file  Social History Narrative   Patient drinks 1 cup of coffee daily.   Patient is right handed.    Patient resides locally with his wife.  He has 3 sons, 2 sons reside in [place] where the other one resides in [place].  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   Social Determinants of Health   Financial Resource Strain: Low Risk  (01/15/2020)   Overall Financial Resource Strain (CARDIA)    Difficulty of Paying Living Expenses: Not hard at all  Food Insecurity: No Food Insecurity (01/15/2020)   Hunger Vital Sign    Worried About Running Out of Food in the Last Year: Never true    Ran Out of Food in the Last Year: Never true  Transportation Needs: No Transportation Needs (01/15/2020)   PRAPARE - Hydrologist (Medical): No    Lack of Transportation (Non-Medical): No  Physical Activity: Insufficiently Active (01/15/2020)   Exercise Vital Sign    Days of Exercise per Week: 7 days    Minutes of Exercise per Session: 20 min  Stress: No Stress Concern Present (01/15/2020)   [place]    Feeling of Stress : Not at all  Social Connections: [place] (01/15/2020)   Social Connection and Isolation Panel [NHANES]    Frequency of Communication with Friends and Family: More than three times a week    Frequency of Social Gatherings with Friends  and Family: More than three times a week    Attends Religious Services: More than 4 times per year    Active Member of [employer] or Organizations: Yes    Attends Archivist Meetings: More than 4 times per year    Marital Status: Married  Human resources officer Violence: Not At Risk (01/15/2020)   Humiliation, Afraid, Rape, and Kick questionnaire    Fear of Current or Ex-Partner: No    Emotionally Abused: No  Physically Abused: No    Sexually Abused: No   Family History  Problem Relation Age of Onset   Breast cancer Mother    Stroke Mother    Hypertension Mother    Lung cancer Father        smoked   Ulcers Father    Heart disease Brother 5       stent   Esophageal cancer Brother    Cancer Paternal Uncle        prostate   Cancer Paternal Grandfather        prostate   Heart attack Neg Hx    Colon cancer Neg Hx    Colon polyps Neg Hx    Rectal cancer Neg Hx    Stomach cancer Neg Hx     Objective: Office vital signs reviewed. BP (!) 157/83   Pulse 90   Temp 98.1 F (36.7 C)   Ht '5\' 7"'$  (1.702 m)   Wt 227 lb 6.4 oz (103.1 kg)   SpO2 98%   BMI 35.62 kg/m   Physical Examination:  General: Awake, alert, obese male, No acute distress HEENT: sclera white, MMM Cardio: regular rate and rhythm, S1S2 heard, no murmurs appreciated Pulm: clear to auscultation bilaterally, no wheezes, rhonchi or rales; normal work of breathing on room air MSK: Ambulating independently  Assessment/ Plan: 81 y.o. male   Type 2 diabetes mellitus with vascular disease ([place]) - Plan: Microalbumin / creatinine urine ratio, Bayer DCA Hb A1c Waived  Chronic kidney disease (CKD), stage IV (severe) ([facility]) - Plan: Renal function panel  Hypertension associated with type 2 diabetes mellitus ([place])  Hyperlipidemia associated with type 2 diabetes mellitus ([place])  Diet-controlled diabetes.  Associated with CKD 4.  Experiencing hyperkalemia as of late so I wonder if he should come off of his olmesartan.  May  benefit from addition of Farxiga which now can be used through hemodialysis stages of renal disease.  Risk of hypovolemia with [place] given use of Lasix so would need to monitor that closely.  I considered Asa Lente as an option but also has a risk of hyperkalemia.  Also could not be initiated if K is above 5.  I have left a voice message for his nephrologist so that we can coordinate care for this patient.  Blood pressure was above goal here today but his home blood pressures are appropriate send no changes were made.  Anticipate need for advancing amlodipine to 10 mg if we do discontinue his olmesartan  Continue statin.  Not due for fasting lipid  Orders Placed This Encounter  Procedures   Microalbumin / creatinine urine ratio   Renal function panel   Bayer DCA Hb A1c Waived   No orders of the defined types were placed in this encounter.    [name], DO [place] [phone]

## 2022-06-04 LAB — MICROALBUMIN / CREATININE URINE RATIO
Creatinine, Urine: 24.3 mg/dL
Microalb/Creat Ratio: 4326 mg/g creat — ABNORMAL HIGH (ref 0–29)
Microalbumin, Urine: 1051.1 ug/mL

## 2022-06-06 LAB — RENAL FUNCTION PANEL
Albumin: 3.8 g/dL (ref 3.7–4.7)
BUN/Creatinine Ratio: 16 (ref 10–24)
BUN: 49 mg/dL — ABNORMAL HIGH (ref 8–27)
CO2: 22 mmol/L (ref 20–29)
Calcium: 8.8 mg/dL (ref 8.6–10.2)
Chloride: 104 mmol/L (ref 96–106)
Creatinine, Ser: 3.03 mg/dL (ref 0.76–1.27)
Glucose: 101 mg/dL — ABNORMAL HIGH (ref 70–99)
Phosphorus: 4.1 mg/dL (ref 2.8–4.1)
Potassium: 5 mmol/L (ref 3.5–5.2)
Sodium: 140 mmol/L (ref 134–144)
eGFR: 20 mL/min/{1.73_m2} — ABNORMAL LOW (ref >59)

## 2022-06-08 ENCOUNTER — Ambulatory Visit: Payer: Medicare Other

## 2022-06-29 DIAGNOSIS — H353231 Exudative age-related macular degeneration, bilateral, with active choroidal neovascularization: Secondary | ICD-10-CM | POA: Diagnosis not present

## 2022-06-29 DIAGNOSIS — H43811 Vitreous degeneration, right eye: Secondary | ICD-10-CM | POA: Diagnosis not present

## 2022-06-29 DIAGNOSIS — H35373 Puckering of macula, bilateral: Secondary | ICD-10-CM | POA: Diagnosis not present

## 2022-06-29 DIAGNOSIS — H31011 Macula scars of posterior pole (postinflammatory) (post-traumatic), right eye: Secondary | ICD-10-CM | POA: Diagnosis not present

## 2022-06-30 ENCOUNTER — Other Ambulatory Visit: Payer: Self-pay | Admitting: Family Medicine

## 2022-08-05 ENCOUNTER — Other Ambulatory Visit: Payer: Self-pay | Admitting: Family Medicine

## 2022-08-05 DIAGNOSIS — E1169 Type 2 diabetes mellitus with other specified complication: Secondary | ICD-10-CM

## 2022-08-05 DIAGNOSIS — E1159 Type 2 diabetes mellitus with other circulatory complications: Secondary | ICD-10-CM

## 2022-08-18 ENCOUNTER — Ambulatory Visit (INDEPENDENT_AMBULATORY_CARE_PROVIDER_SITE_OTHER): Payer: Medicare Other

## 2022-08-18 VITALS — Ht 67.0 in | Wt 225.0 lb

## 2022-08-18 DIAGNOSIS — Z0000 Encounter for general adult medical examination without abnormal findings: Secondary | ICD-10-CM | POA: Diagnosis not present

## 2022-08-18 NOTE — Progress Notes (Signed)
Subjective:   Ricardo Mayer. is a 82 y.o. male who presents for Medicare Annual/Subsequent preventive examination. I connected with  Ricardo Mayer. on 08/18/22 by a audio enabled telemedicine application and verified that I am speaking with the correct person using two identifiers.  Patient Location: Home  Provider Location: Home Office  I discussed the limitations of evaluation and management by telemedicine. The patient expressed understanding and agreed to proceed.  Review of Systems     Cardiac Risk Factors include: advanced age (>67mn, >>28women);hypertension;male gender;dyslipidemia     Objective:    Today's Vitals   08/18/22 1118  Weight: 225 lb (102.1 kg)  Height: 5' 7" (1.702 m)   Body mass index is 35.24 kg/m.     08/18/2022   11:22 AM 10/25/2021   12:08 PM 10/18/2021    9:32 AM 08/13/2021    1:32 PM 08/06/2019    9:50 AM 10/31/2018    7:57 AM 07/05/2018    9:10 AM  Advanced Directives  Does Patient Have a Medical Advance Directive? Yes No No Yes Yes Yes Yes  Type of AParamed[place]of [place] will   Living will;Healthcare Power of [place] will [place] will [place]Living will  Does patient want to make changes to medical advance directive? No - Patient declined   No - Patient declined No - Patient declined  No - Patient declined  Copy of [place] Chart? Yes - validated most recent copy scanned in chart (See row information)    Yes - validated most recent copy scanned in chart (See row information)  Yes - validated most recent copy scanned in chart (See row information)    Current Medications (verified) Outpatient Encounter Medications as of 08/18/2022  Medication Sig   amLODipine (NORVASC) 5 MG tablet Take 1 tablet (5 mg total) by mouth daily.   aspirin 325 MG tablet Take 1 tablet (325 mg total) by mouth daily.    atorvastatin (LIPITOR) 10 MG tablet Take 1 tablet (10 mg total) by mouth daily.   cholecalciferol (VITAMIN D3) 25 MCG (1000 UT) tablet Take 1,000 Units by mouth daily.   furosemide (LASIX) 20 MG tablet TAKE 1 AND 1/2 TABLETS BY MOUTH  DAILY AS NEEDED FOR SWELLING   glucose blood (ONE TOUCH ULTRA TEST) test strip Test up to tid. DX E11.9   mirabegron ER (MYRBETRIQ) 25 MG TB24 tablet Take 1 tablet (25 mg total) by mouth daily.   olmesartan (BENICAR) 40 MG tablet Take 1 tablet (40 mg total) by mouth daily.   predniSONE (STERAPRED UNI-PAK 48 TAB) 5 MG (48) TBPK tablet Take 10 mg by mouth 2 (two) times daily. (Patient not taking: Reported on 08/18/2022)   No facility-administered encounter medications on file as of 08/18/2022.    Allergies (verified) Patient has no known allergies.   History: Past Medical History:  Diagnosis Date   Adenomatous polyp of colon    Arthritis    bilateral knees   Atypical mole 2016   left shoulder sever wider shave   BCC (basal cell carcinoma of skin) 2011   left neck tx mohs   BCC (basal cell carcinoma of skin) 2015   left sideburn tx with bx   BCC (basal cell carcinoma of skin) 2015   right post shoulder tx with bx   BCC (basal cell carcinoma of skin) 2016   left chest tx with bx  BCC (basal cell carcinoma of skin) 2017   left cheek cx3 79f   CKD (chronic kidney disease), stage II    Dr. 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 MLaurance FlattenPCP   History of basal cell cancer 2011   right inner eye tx mohs   History of DVT of lower extremity    2011--  LEFT LEG   History of kidney stones    History of melanoma excision    RIGHT ARM   Hyperlipidemia    Hypertension    Impaired hearing    hearing aids bilateral   Macular degeneration    [place] [place]   arm and back per patient  -Melanoma   Near syncope 08/25/2015   Dr. VRoland Rackonce"found  blood clots in lung"   Nocturia    Prostate cancer ([facility] 2014   Dr. WCherylann Parr "seed implant" '14   Ulcer of left lower leg ([place]    recent pcp visit dr mLaurance Flatten-  ordered betadine wet/dry dsg daily (03-18-2013)  CSan Geronimo1 WEEK   Past Surgical History:  Procedure Laterality Date   CATARACT EXTRACTION W/PHACO Left 10/25/2021   Procedure: CATARACT EXTRACTION PHACO AND INTRAOCULAR LENS PLACEMENT ([place];  Surgeon: WBaruch Goldmann MD;  Location: [facility];  Service: Ophthalmology;  Laterality: Left;  CDE: 24.87   COLONOSCOPY     EYE SURGERY     KIDNEY STONE EXTRACTIONS  LAST ONE 1970   X2 OPEN/  X1  URETEROSCOPY   MELANOMA EXCISION RIGHT ARM  1990   POLYPECTOMY     PROSTATE BIOPSY     RADIOACTIVE SEED IMPLANT N/A 03/21/2013   Procedure: RADIOACTIVE SEED IMPLANT;  Surgeon: JMalka So MD;  [facility]  Service: Urology;  Laterality: N/A;  87 seeds implanted     TONSILLECTOMY     TOTAL KNEE ARTHROPLASTY Right 03/21/2016   Procedure: RIGHT TOTAL KNEE ARTHROPLASTY;  Surgeon: MParalee Cancel MD;  Location: [facility];  Service: Orthopedics;  Laterality: Right;   Family History  Problem Relation Age of Onset   Breast cancer Mother    Stroke Mother    Hypertension Mother    Lung cancer Father        smoked   Ulcers Father    Heart disease Brother 654      stent   Esophageal cancer Brother    Cancer Paternal Uncle        prostate   Cancer Paternal Grandfather        prostate   Heart attack Neg Hx    Colon cancer Neg Hx    Colon polyps Neg Hx    Rectal cancer Neg Hx    Stomach cancer Neg Hx    Social History   Socioeconomic History   Marital status: Married    Spouse name: Janelle   Number of children: 3   Years of education: master's   Highest education level: Master's degree (e.g., MA, MS, MEng, MEd, MSW, MBA)  Occupational History   Occupation: tRetail buyer   Comment: retired  Tobacco Use   Smoking status: Never    Smokeless tobacco: Never  Vaping Use   Vaping Use: Never used  Substance and Sexual Activity   Alcohol use: No   Drug use: No   Sexual activity: Not Currently  Other Topics Concern  Not on file  Social History Narrative   Patient drinks 1 cup of coffee daily.   Patient is right handed.    Patient resides locally with his wife.  He has 3 sons, 2 sons reside in [place] where the other one resides in [place].  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   Social Determinants of Health   Financial Resource Strain: Low Risk  (08/18/2022)   Overall Financial Resource Strain (CARDIA)    Difficulty of Paying Living Expenses: Not hard at all  Food Insecurity: No Food Insecurity (08/18/2022)   Hunger Vital Sign    Worried About Running Out of Food in the Last Year: Never true    Ran Out of Food in the Last Year: Never true  Transportation Needs: No Transportation Needs (08/18/2022)   PRAPARE - Hydrologist (Medical): No    Lack of Transportation (Non-Medical): No  Physical Activity: Insufficiently Active (08/18/2022)   Exercise Vital Sign    Days of Exercise per Week: 3 days    Minutes of Exercise per Session: 30 min  Stress: No Stress Concern Present (08/18/2022)   [place]    Feeling of Stress : Not at all  Social Connections: Moderately Integrated (08/18/2022)   Social Connection and Isolation Panel [NHANES]    Frequency of Communication with Friends and Family: More than three times a week    Frequency of Social Gatherings with Friends and Family: More than three times a week    Attends Religious Services: More than 4 times per year    Active Member of [employer] or Organizations: No    Attends Music therapist: Never    Marital Status: Married     Tobacco Counseling Counseling given: Not Answered   Clinical Intake:  Pre-visit preparation completed: Yes  Pain : No/denies pain     Nutritional Risks: None Diabetes: No  How often do you need to have someone help you when you read instructions, pamphlets, or other written materials from your doctor or pharmacy?: 1 - Never  Diabetic?no   Interpreter Needed?: No  Information entered by :: Jadene Pierini, LPN   Activities of Daily Living    08/18/2022   11:22 AM 08/12/2022    7:39 PM  In your present state of health, do you have any difficulty performing the following activities:  Hearing? 0 1  Vision? 0 0  Difficulty concentrating or making decisions? 0 0  Walking or climbing stairs? 0 0  Dressing or bathing? 0 0  Doing errands, shopping? 0 0  Preparing Food and eating ? N N  Using the Toilet? N N  In the past six months, have you accidently leaked urine? N Y  Do you have problems with loss of bowel control? N N  Managing your Medications? N N  Managing your Finances? N N  Housekeeping or managing your Housekeeping? N N    Patient Care Team: [name], DO as PCP - General (Family Medicine) Kathrynn Ducking, MD (Inactive) as Consulting Physician (Neurology) Jettie Booze, MD as Consulting Physician (Cardiology) Starlyn Skeans as Physician Assistant (Dermatology) Elmarie Shiley, MD as Consulting Physician (Nephrology) Irine Seal, MD as Attending Physician (Urology)  Indicate any recent Medical Services you may have received from  other than [facility] providers in the past year (date may be approximate).     Assessment:   This is a routine wellness examination for Lucile.  Hearing/Vision screen Vision Screening - Comments:: Wears rx glasses - up to date with routine eye exams with  Dr.Saunders  Dietary issues and exercise activities discussed: Current Exercise Habits: Home exercise routine, Type of exercise: walking, Time (Minutes): 30,  Frequency (Times/Week): 3, Weekly Exercise (Minutes/Week): 90, Intensity: Mild, Exercise limited by: cardiac condition(s);respiratory conditions(s)   Goals Addressed             This Visit's Progress    DIET - INCREASE WATER INTAKE   On track    Try to drink 6-8 glasses of water daily       Depression Screen    08/18/2022   11:21 AM 06/03/2022    8:39 AM 01/31/2022   10:09 AM 10/01/2021    8:24 AM 09/28/2021    3:21 PM 05/13/2020    8:26 AM 01/10/2020    8:21 AM  PHQ 2/9 Scores  PHQ - 2 Score 0 0 0 0 1 0 0  PHQ- 9 Score    6 9      Fall Risk    08/18/2022   11:19 AM 08/12/2022    7:39 PM 06/03/2022    8:39 AM 01/31/2022   10:09 AM 10/01/2021    8:24 AM  Fall Risk   Falls in the past year? 0 0 0 0 0  Number falls in past yr: 0      Injury with Fall? 0      Risk for fall due to : No Fall Risks      Follow up Falls prevention discussed        [place]iza:  Any stairs in or around the home? Yes  If so, are there any without handrails? No  Home free of loose throw rugs in walkways, pet beds, electrical cords, etc? Yes  Adequate lighting in your home to reduce risk of falls? Yes   ASSISTIVE DEVICES UTILIZED TO PREVENT FALLS:  Life alert? No  Use of a cane, walker or w/c? No  Grab bars in the bathroom? Yes  Shower chair or bench in shower? Yes  Elevated toilet seat or a handicapped toilet? Yes       01/31/2022   10:24 AM 07/05/2018    9:39 AM 08/26/2015    3:11 PM 08/26/2015    3:07 PM  MMSE - Mini Mental State Exam  Orientation to time _0 Orientation to Place _1 Registration _2 Attention/ Calculation _3 Recall _4 Language- name 2 objects _5 Language- repeat _6 Language- follow 3 step command _7 Language- read & follow direction _8 Write a sentence _9 Copy design _10 Total score _11 08/18/2022   11:23 AM 08/13/2021    1:31 PM 08/13/2021    1:30 PM  08/06/2019    9:55 AM  6CIT Screen  What Year? 0 points  0 points 0 points  What month? 0 points  0 points 0 points  What time? 0 points 0 points 0 points 0 points  Count back from 20 0 points  0 points 0 points 0 points  Months in reverse 0 points  0 points 0 points  Repeat phrase 0 points  0 points 2 points  Total Score 0 points  0 points 2 points    Immunizations Immunization History  Administered Date(s) Administered   Fluad Quad(high Dose 65+) 05/16/2019, 05/13/2020, 05/17/2021, 06/03/2022   Influenza Whole 05/20/2016   Influenza-Unspecified 05/15/2013, 05/15/2014, 06/09/2015   Moderna Sars-Covid-2 Vaccination 08/27/2019, 10/07/2019   Pneumococcal Conjugate-13 10/09/2013   Pneumococcal Polysaccharide-23 07/05/2018   Tdap 04/07/2011    TDAP status: Due, Education has been provided regarding the importance of this vaccine. Advised may receive this vaccine at local pharmacy or Health Dept. Aware to provide a copy of the vaccination record if obtained from local pharmacy or Health Dept. Verbalized acceptance and understanding.  Flu Vaccine status: Up to date  Pneumococcal vaccine status: Up to date  Covid-19 vaccine status: Completed vaccines  Qualifies for Shingles Vaccine? Yes   Zostavax completed Yes   Shingrix Completed?: Yes  Screening Tests Health Maintenance  Topic Date Due   COVID-19 Vaccine (3 - Moderna risk series) 11/04/2019   DTaP/Tdap/Td (2 - Td or Tdap) 04/06/2021   Zoster Vaccines- Shingrix (1 of 2) 09/03/2022 (Originally 02/17/1960)   FOOT EXAM  10/01/2022   OPHTHALMOLOGY EXAM  11/26/2022   HEMOGLOBIN A1C  12/03/2022   Diabetic kidney evaluation - eGFR measurement  06/04/2023   Diabetic kidney evaluation - Urine ACR  06/04/2023   Medicare Annual Wellness (AWV)  08/19/2023   COLONOSCOPY (Pts 45-56yr Insurance coverage will need to be confirmed)  12/17/2023   Pneumonia Vaccine 82 Years old  Completed   INFLUENZA VACCINE  Completed   HPV VACCINES  Aged  Out    Health Maintenance  Health Maintenance Due  Topic Date Due   COVID-19 Vaccine (3 - Moderna risk series) 11/04/2019   DTaP/Tdap/Td (2 - Td or Tdap) 04/06/2021    Colorectal cancer screening: No longer required.   Lung Cancer Screening: (Low Dose CT Chest recommended if Age 82-80years, 30 pack-year currently smoking OR have quit w/in 15years.) does not qualify.   Lung Cancer Screening Referral: n/a  Additional Screening:  Hepatitis C Screening: does not qualify;   Vision Screening: Recommended annual ophthalmology exams for early detection of glaucoma and other disorders of the eye. Is the patient up to date with their annual eye exam?  Yes  Who is the provider or what is the name of the office in which the patient attends annual eye exams? Dr.Saunders  If pt is not established with a provider, would they like to be referred to a provider to establish care? No .   Dental Screening: Recommended annual dental exams for proper oral hygiene  Community Resource Referral / Chronic Care Management: CRR required this visit?  No   CCM required this visit?  No      Plan:     I have personally reviewed and noted the following in the patient's chart:   Medical and social history Use of alcohol, tobacco or illicit drugs  Current medications and supplements including opioid prescriptions. Patient is not currently taking opioid prescriptions. Functional ability and status Nutritional status Physical activity Advanced directives List of other physicians Hospitalizations, surgeries, and ER visits in previous 12 months Vitals Screenings to include cognitive, depression, and falls Referrals and appointments  In addition, I have reviewed and discussed with patient certain preventive protocols, quality metrics, and best practice recommendations. A written personalized care plan for preventive  services as well as general preventive health recommendations were provided to  patient.     Daphane Shepherd, LPN   6/0/2782   Nurse Notes: Due TDAP vaccine

## 2022-08-18 NOTE — Patient Instructions (Signed)
[name] , Thank you for taking time to come for your Medicare Wellness Visit. I appreciate your ongoing commitment to your health goals. Please review the following plan we discussed and let me know if I can assist you in the future.   These are the goals we discussed:  Goals      DIET - INCREASE WATER INTAKE     Try to drink 6-8 glasses of water daily     Exercise 3x per week (30 min per time)     Working out at [employer] or walking are great options.     Prevent falls        This is a list of the screening recommended for you and due dates:  Health Maintenance  Topic Date Due   COVID-19 Vaccine (3 - Moderna risk series) 11/04/2019   DTaP/Tdap/Td vaccine (2 - Td or Tdap) 04/06/2021   Zoster (Shingles) Vaccine (1 of 2) 09/03/2022*   Complete foot exam   10/01/2022   Eye exam for diabetics  11/26/2022   Hemoglobin A1C  12/03/2022   Yearly kidney function blood test for diabetes  06/04/2023   Yearly kidney health urinalysis for diabetes  06/04/2023   Medicare Annual Wellness Visit  08/19/2023   Colon Cancer Screening  12/17/2023   Pneumonia Vaccine  Completed   Flu Shot  Completed   HPV Vaccine  Aged Out  *Topic was postponed. The date shown is not the original due date.    Advanced directives: In Chart   Conditions/risks identified: Aim for 30 minutes of exercise or brisk walking, 6-8 glasses of water, and 5 servings of fruits and vegetables each day.   Next appointment: Follow up in one year for your annual wellness visit.   Preventive Care 70 Years and Older, Male  Preventive care refers to lifestyle choices and visits with your health care provider that can promote health and wellness. What does preventive care include? A yearly physical exam. This is also called an annual well check. Dental exams once or twice a year. Routine eye exams. Ask your health care provider how often you should have your eyes checked. Personal lifestyle choices, including: Daily care of  your teeth and gums. Regular physical activity. Eating a healthy diet. Avoiding tobacco and drug use. Limiting alcohol use. Practicing safe sex. Taking low doses of aspirin every day. Taking vitamin and mineral supplements as recommended by your health care provider. What happens during an annual well check? The services and screenings done by your health care provider during your annual well check will depend on your age, overall health, lifestyle risk factors, and family history of disease. Counseling  Your health care provider may ask you questions about your: Alcohol use. Tobacco use. Drug use. Emotional well-being. Home and relationship well-being. Sexual activity. Eating habits. History of falls. Memory and ability to understand (cognition). Work and work Statistician. Screening  You may have the following tests or measurements: Height, weight, and BMI. Blood pressure. Lipid and cholesterol levels. These may be checked every 5 years, or more frequently if you are over 83 years old. Skin check. Lung cancer screening. You may have this screening every year starting at age 75 if you have a 30-pack-year history of smoking and currently smoke or have quit within the past 15 years. Fecal occult blood test (FOBT) of the stool. You may have this test every year starting at age 19. Flexible sigmoidoscopy or colonoscopy. You may have a sigmoidoscopy every 5 years or  a colonoscopy every 10 years starting at age 28. Prostate cancer screening. Recommendations will vary depending on your family history and other risks. Hepatitis C blood test. Hepatitis B blood test. Sexually transmitted disease (STD) testing. Diabetes screening. This is done by checking your blood sugar (glucose) after you have not eaten for a while (fasting). You may have this done every 1-3 years. Abdominal aortic aneurysm (AAA) screening. You may need this if you are a current or former smoker. Osteoporosis. You may be  screened starting at age 88 if you are at high risk. Talk with your health care provider about your test results, treatment options, and if necessary, the need for more tests. Vaccines  Your health care provider may recommend certain vaccines, such as: Influenza vaccine. This is recommended every year. Tetanus, diphtheria, and acellular pertussis (Tdap, Td) vaccine. You may need a Td booster every 10 years. Zoster vaccine. You may need this after age 68. Pneumococcal 13-valent conjugate (PCV13) vaccine. One dose is recommended after age 61. Pneumococcal polysaccharide (PPSV23) vaccine. One dose is recommended after age 47. Talk to your health care provider about which screenings and vaccines you need and how often you need them. This information is not intended to replace advice given to you by your health care provider. Make sure you discuss any questions you have with your health care provider. Document Released: 08/28/2015 Document Revised: 04/20/2016 Document Reviewed: 06/02/2015 Elsevier Interactive Patient Education  2017 [place] Prevention in the Home Falls can cause injuries. They can happen to people of all ages. There are many things you can do to make your home safe and to help prevent falls. What can I do on the outside of my home? Regularly fix the edges of walkways and driveways and fix any cracks. Remove anything that might make you trip as you walk through a door, such as a raised step or threshold. Trim any bushes or trees on the path to your home. Use bright outdoor lighting. Clear any walking paths of anything that might make someone trip, such as rocks or tools. Regularly check to see if handrails are loose or broken. Make sure that both sides of any steps have handrails. Any raised decks and porches should have guardrails on the edges. Have any leaves, snow, or ice cleared regularly. Use sand or salt on walking paths during winter. Clean up any spills in  your garage right away. This includes oil or grease spills. What can I do in the bathroom? Use night lights. Install grab bars by the toilet and in the tub and shower. Do not use towel bars as grab bars. Use non-skid mats or decals in the tub or shower. If you need to sit down in the shower, use a plastic, non-slip stool. Keep the floor dry. Clean up any water that spills on the floor as soon as it happens. Remove soap buildup in the tub or shower regularly. Attach bath mats securely with double-sided non-slip rug tape. Do not have throw rugs and other things on the floor that can make you trip. What can I do in the bedroom? Use night lights. Make sure that you have a light by your bed that is easy to reach. Do not use any sheets or blankets that are too big for your bed. They should not hang down onto the floor. Have a firm chair that has side arms. You can use this for support while you get dressed. Do not have throw rugs and other  things on the floor that can make you trip. What can I do in the kitchen? Clean up any spills right away. Avoid walking on wet floors. Keep items that you use a lot in easy-to-reach places. If you need to reach something above you, use a strong step stool that has a grab bar. Keep electrical cords out of the way. Do not use floor polish or wax that makes floors slippery. If you must use wax, use non-skid floor wax. Do not have throw rugs and other things on the floor that can make you trip. What can I do with my stairs? Do not leave any items on the stairs. Make sure that there are handrails on both sides of the stairs and use them. Fix handrails that are broken or loose. Make sure that handrails are as long as the stairways. Check any carpeting to make sure that it is firmly attached to the stairs. Fix any carpet that is loose or worn. Avoid having throw rugs at the top or bottom of the stairs. If you do have throw rugs, attach them to the floor with carpet  tape. Make sure that you have a light switch at the top of the stairs and the bottom of the stairs. If you do not have them, ask someone to add them for you. What else can I do to help prevent falls? Wear shoes that: Do not have high heels. Have rubber bottoms. Are comfortable and fit you well. Are closed at the toe. Do not wear sandals. If you use a stepladder: Make sure that it is fully opened. Do not climb a closed stepladder. Make sure that both sides of the stepladder are locked into place. Ask someone to hold it for you, if possible. Clearly mark and make sure that you can see: Any grab bars or handrails. First and last steps. Where the edge of each step is. Use tools that help you move around (mobility aids) if they are needed. These include: Canes. Walkers. Scooters. Crutches. Turn on the lights when you go into a dark area. Replace any light bulbs as soon as they burn out. Set up your furniture so you have a clear path. Avoid moving your furniture around. If any of your floors are uneven, fix them. If there are any pets around you, be aware of where they are. Review your medicines with your doctor. Some medicines can make you feel dizzy. This can increase your chance of falling. Ask your doctor what other things that you can do to help prevent falls. This information is not intended to replace advice given to you by your health care provider. Make sure you discuss any questions you have with your health care provider. Document Released: 05/28/2009 Document Revised: 01/07/2016 Document Reviewed: 09/05/2014 Elsevier Interactive Patient Education  2017 [employer].

## 2022-08-22 ENCOUNTER — Other Ambulatory Visit: Payer: Medicare Other

## 2022-08-22 DIAGNOSIS — N184 Chronic kidney disease, stage 4 (severe): Secondary | ICD-10-CM | POA: Diagnosis not present

## 2022-09-01 DIAGNOSIS — D631 Anemia in chronic kidney disease: Secondary | ICD-10-CM | POA: Diagnosis not present

## 2022-09-01 DIAGNOSIS — I129 Hypertensive chronic kidney disease with stage 1 through stage 4 chronic kidney disease, or unspecified chronic kidney disease: Secondary | ICD-10-CM | POA: Diagnosis not present

## 2022-09-01 DIAGNOSIS — N2581 Secondary hyperparathyroidism of renal origin: Secondary | ICD-10-CM | POA: Diagnosis not present

## 2022-09-01 DIAGNOSIS — N184 Chronic kidney disease, stage 4 (severe): Secondary | ICD-10-CM | POA: Diagnosis not present

## 2022-09-02 DIAGNOSIS — H35373 Puckering of macula, bilateral: Secondary | ICD-10-CM | POA: Diagnosis not present

## 2022-09-02 DIAGNOSIS — H353231 Exudative age-related macular degeneration, bilateral, with active choroidal neovascularization: Secondary | ICD-10-CM | POA: Diagnosis not present

## 2022-09-02 DIAGNOSIS — H43811 Vitreous degeneration, right eye: Secondary | ICD-10-CM | POA: Diagnosis not present

## 2022-09-08 ENCOUNTER — Other Ambulatory Visit: Payer: Self-pay | Admitting: Family Medicine

## 2022-10-01 ENCOUNTER — Other Ambulatory Visit: Payer: Self-pay | Admitting: Family Medicine

## 2022-10-01 DIAGNOSIS — E1169 Type 2 diabetes mellitus with other specified complication: Secondary | ICD-10-CM

## 2022-10-01 DIAGNOSIS — E1159 Type 2 diabetes mellitus with other circulatory complications: Secondary | ICD-10-CM

## 2022-11-04 DIAGNOSIS — H35373 Puckering of macula, bilateral: Secondary | ICD-10-CM | POA: Diagnosis not present

## 2022-11-04 DIAGNOSIS — H353231 Exudative age-related macular degeneration, bilateral, with active choroidal neovascularization: Secondary | ICD-10-CM | POA: Diagnosis not present

## 2022-11-04 DIAGNOSIS — H43811 Vitreous degeneration, right eye: Secondary | ICD-10-CM | POA: Diagnosis not present

## 2022-11-07 ENCOUNTER — Ambulatory Visit (INDEPENDENT_AMBULATORY_CARE_PROVIDER_SITE_OTHER): Payer: Medicare Other | Admitting: Family Medicine

## 2022-11-07 ENCOUNTER — Encounter: Payer: Self-pay | Admitting: Family Medicine

## 2022-11-07 VITALS — BP 156/80 | HR 85 | Temp 98.7°F | Ht 67.0 in | Wt 226.0 lb

## 2022-11-07 DIAGNOSIS — R051 Acute cough: Secondary | ICD-10-CM | POA: Diagnosis not present

## 2022-11-07 DIAGNOSIS — J329 Chronic sinusitis, unspecified: Secondary | ICD-10-CM

## 2022-11-07 DIAGNOSIS — J40 Bronchitis, not specified as acute or chronic: Secondary | ICD-10-CM | POA: Diagnosis not present

## 2022-11-07 MED ORDER — LORATADINE 10 MG PO TABS: ORAL_TABLET | ORAL | 11 refills | Status: DC

## 2022-11-07 MED ORDER — AZELASTINE HCL 0.1 % NA SOLN: 1.0000 | Freq: Two times a day (BID) | NASAL | 12 refills | Status: DC

## 2022-11-07 MED ORDER — BENZONATATE 100 MG PO CAPS: 100.0000 mg | ORAL_CAPSULE | Freq: Three times a day (TID) | ORAL | 0 refills | Status: DC | PRN

## 2022-11-07 MED ORDER — DOXYCYCLINE HYCLATE 100 MG PO TABS: 100.0000 mg | ORAL_TABLET | Freq: Two times a day (BID) | ORAL | 0 refills | Status: AC

## 2022-11-07 NOTE — Patient Instructions (Signed)
Astelin nasal spray for runny nose Claritin EVERY OTHER day (this is the kidney dosing) for allergies/ sneezing/ runny nose IF your symptoms start to get worse, you get brown/ bloody cough, fevers, pus from your nose turn in the Doxycycline prescription I printed for you.  I really think this is just allergies right now.  Doesn't seem infectious but we did swab you for flu, COVID and RSV

## 2022-11-07 NOTE — Progress Notes (Signed)
Subjective: CC:URI? PCP: [name], DO TY:[id number] D Papa Mettler. is a 82 y.o. male presenting to clinic today for:  1.  Cough Patient reports onset of cough, runny nose on Saturday.  Denies any hemoptysis, brown sputum.  No shortness of breath or wheezing.  He reports sneezing, watery eyes.  He felt a little lightheaded.  No known sick contacts.  No fevers.  No treatments used thus far except for nasal saline spray   ROS: Per HPI  No Known Allergies Past Medical History:  Diagnosis Date   Adenomatous polyp of colon    Arthritis    bilateral knees   Atypical mole 2016   left shoulder sever wider shave   BCC (basal cell carcinoma of skin) 2011   left neck tx mohs   BCC (basal cell carcinoma of skin) 2015   left sideburn tx with bx   BCC (basal cell carcinoma of skin) 2015   right post shoulder tx with bx   BCC (basal cell carcinoma of skin) 2016   left chest tx with bx   BCC (basal cell carcinoma of skin) 2017   left cheek cx3 50fu   CKD (chronic kidney disease), stage II    Dr. 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 Laurance Flatten.PCP   History of basal cell cancer 2011   right inner eye tx mohs   History of DVT of lower extremity    2011--  LEFT LEG   History of kidney stones    History of melanoma excision    RIGHT ARM   Hyperlipidemia    Hypertension    Impaired hearing    hearing aids bilateral   Macular degeneration    Melanoma ([place]) 1990   arm and back per patient  -Melanoma   Near syncope 08/25/2015   Dr. Roland Rack once"found blood clots in lung"   Nocturia    Prostate cancer [facility]) 2014   Dr. Cherylann Parr- "seed implant" '14   Ulcer of left lower leg ([place])    recent pcp visit dr Laurance Flatten--  ordered betadine wet/dry dsg daily (03-18-2013)  [place] IN 1 WEEK    Current Outpatient Medications:    amLODipine (NORVASC) 5 MG  tablet, TAKE 1 TABLET BY MOUTH DAILY, Disp: 60 tablet, Rfl: 0   aspirin 325 MG tablet, Take 1 tablet (325 mg total) by mouth daily., Disp: 90 tablet, Rfl: 3   atorvastatin (LIPITOR) 10 MG tablet, TAKE 1 TABLET BY MOUTH DAILY, Disp: 60 tablet, Rfl: 0   cholecalciferol (VITAMIN D3) 25 MCG (1000 UT) tablet, Take 1,000 Units by mouth daily., Disp: , Rfl:    furosemide (LASIX) 20 MG tablet, TAKE 1 AND 1/2 TABLETS BY MOUTH  DAILY AS NEEDED FOR SWELLING, Disp: 150 tablet, Rfl: 0   glucose blood (ONE TOUCH ULTRA TEST) test strip, Test up to tid. DX E11.9, Disp: 100 each, Rfl: 2   mirabegron ER (MYRBETRIQ) 25 MG TB24 tablet, Take 1 tablet (25 mg total) by mouth daily., Disp: 28 tablet, Rfl: 0   olmesartan (BENICAR) 40 MG tablet, TAKE 1 TABLET BY MOUTH DAILY, Disp: 60 tablet, Rfl: 0   predniSONE (STERAPRED UNI-PAK 48 TAB) 5 MG (48) TBPK tablet, Take 10 mg by mouth 2 (two) times daily., Disp: , Rfl:  Social History   Socioeconomic History   Marital status: Married  Spouse name: Janelle   Number of children: 3   Years of education: master's   Highest education level: Master's degree (e.g., MA, MS, MEng, MEd, MSW, MBA)  Occupational History   Occupation: Retail buyer    Comment: retired  Tobacco Use   Smoking status: Never   Smokeless tobacco: Never  Vaping Use   Vaping Use: Never used  Substance and Sexual Activity   Alcohol use: No   Drug use: No   Sexual activity: Not Currently  Other Topics Concern   Not on file  Social History Narrative   Patient drinks 1 cup of coffee daily.   Patient is right handed.    Patient resides locally with his wife.  He has 3 sons, 2 sons reside in [place] where the other one resides in [place].  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   Social Determinants of Health   Financial Resource Strain: Low Risk   (08/18/2022)   Overall Financial Resource Strain (CARDIA)    Difficulty of Paying Living Expenses: Not hard at all  Food Insecurity: No Food Insecurity (08/18/2022)   Hunger Vital Sign    Worried About Running Out of Food in the Last Year: Never true    Ran Out of Food in the Last Year: Never true  Transportation Needs: No Transportation Needs (08/18/2022)   PRAPARE - Hydrologist (Medical): No    Lack of Transportation (Non-Medical): No  Physical Activity: Insufficiently Active (08/18/2022)   Exercise Vital Sign    Days of Exercise per Week: 3 days    Minutes of Exercise per Session: 30 min  Stress: No Stress Concern Present (08/18/2022)   [place]    Feeling of Stress : Not at all  Social Connections: Moderately Integrated (08/18/2022)   Social Connection and Isolation Panel [NHANES]    Frequency of Communication with Friends and Family: More than three times a week    Frequency of Social Gatherings with Friends and Family: More than three times a week    Attends Religious Services: More than 4 times per year    Active Member of [employer] or Organizations: No    Attends Archivist Meetings: Never    Marital Status: Married  Human resources officer Violence: Not At Risk (08/18/2022)   Humiliation, Afraid, Rape, and Kick questionnaire    Fear of Current or Ex-Partner: No    Emotionally Abused: No    Physically Abused: No    Sexually Abused: No   Family History  Problem Relation Age of Onset   Breast cancer Mother    Stroke Mother    Hypertension Mother    Lung cancer Father        smoked   Ulcers Father    Heart disease Brother 19       stent   Esophageal cancer Brother    Cancer Paternal Uncle        prostate   Cancer Paternal Grandfather        prostate   Heart attack Neg Hx    Colon cancer Neg Hx    Colon polyps Neg Hx    Rectal cancer Neg Hx    Stomach cancer Neg Hx      Objective: Office vital signs reviewed. BP (!) 156/80  Pulse 85   Temp 98.7 F (37.1 C)   Ht 5\' 7"  (1.702 m)   Wt 226 lb (102.5 kg)   SpO2 99%   BMI 35.40 kg/m   Physical Examination:  General: Awake, alert, well nourished, No acute distress HEENT: Normal    Neck: No masses palpated. No lymphadenopathy    Ears: Tympanic membranes intact, normal light reflex, no erythema, no bulging    Eyes: PERRLA, extraocular membranes intact, sclera white. Eyes are watery    Nose: nasal turbinates moist, clear nasal discharge    Throat: moist mucus membranes, no erythema, no tonsillar exudate.  Airway is patent Cardio: regular rate and rhythm, S1S2 heard, no murmurs appreciated Pulm: clear to auscultation bilaterally, no wheezes, rhonchi or rales; normal work of breathing on room air   Assessment/ Plan: 82 y.o. male   Rhinosinusitis - Plan: COVID-19, Flu A+B and RSV, benzonatate (TESSALON PERLES) 100 MG capsule, doxycycline (VIBRA-TABS) 100 MG tablet, loratadine (CLARITIN) 10 MG tablet, azelastine (ASTELIN) 0.1 % nasal spray  I think this is all likely allergic in nature but will look for viral etiology and have given him a pocket prescription to have on hand if symptoms progress into bacterial type symptoms.  He will have renally dose Claritin every other day and Astelin nasal spray as needed rhinorrhea.  Follow-up as needed  No orders of the defined types were placed in this encounter.  No orders of the defined types were placed in this encounter.    [name], DO [place] [phone]

## 2022-11-09 LAB — COVID-19, FLU A+B AND RSV
Influenza A, NAA: NOT DETECTED
Influenza B, NAA: NOT DETECTED
RSV, NAA: NOT DETECTED
SARS-CoV-2, NAA: NOT DETECTED

## 2022-11-12 ENCOUNTER — Other Ambulatory Visit: Payer: Self-pay | Admitting: Family Medicine

## 2022-11-12 DIAGNOSIS — E1169 Type 2 diabetes mellitus with other specified complication: Secondary | ICD-10-CM

## 2022-11-12 DIAGNOSIS — E1159 Type 2 diabetes mellitus with other circulatory complications: Secondary | ICD-10-CM

## 2022-11-15 ENCOUNTER — Encounter: Payer: Self-pay | Admitting: Family Medicine

## 2022-11-15 ENCOUNTER — Ambulatory Visit (INDEPENDENT_AMBULATORY_CARE_PROVIDER_SITE_OTHER): Payer: Medicare Other | Admitting: Family Medicine

## 2022-11-15 VITALS — BP 143/78 | HR 81 | Temp 98.2°F | Ht 67.0 in | Wt 226.0 lb

## 2022-11-15 DIAGNOSIS — N3281 Overactive bladder: Secondary | ICD-10-CM | POA: Diagnosis not present

## 2022-11-15 DIAGNOSIS — R0683 Snoring: Secondary | ICD-10-CM | POA: Diagnosis not present

## 2022-11-15 DIAGNOSIS — E1169 Type 2 diabetes mellitus with other specified complication: Secondary | ICD-10-CM

## 2022-11-15 DIAGNOSIS — E785 Hyperlipidemia, unspecified: Secondary | ICD-10-CM | POA: Diagnosis not present

## 2022-11-15 DIAGNOSIS — I152 Hypertension secondary to endocrine disorders: Secondary | ICD-10-CM | POA: Diagnosis not present

## 2022-11-15 DIAGNOSIS — E1159 Type 2 diabetes mellitus with other circulatory complications: Secondary | ICD-10-CM | POA: Diagnosis not present

## 2022-11-15 DIAGNOSIS — G479 Sleep disorder, unspecified: Secondary | ICD-10-CM

## 2022-11-15 MED ORDER — FUROSEMIDE 20 MG PO TABS: ORAL_TABLET | ORAL | 3 refills | Status: DC

## 2022-11-15 MED ORDER — ATORVASTATIN CALCIUM 10 MG PO TABS
10.0000 mg | ORAL_TABLET | Freq: Every day | ORAL | 3 refills | Status: DC
Start: 2022-11-15 — End: 2023-11-13

## 2022-11-15 MED ORDER — AMLODIPINE BESYLATE 5 MG PO TABS: 5.0000 mg | ORAL_TABLET | Freq: Every day | ORAL | 3 refills | Status: DC

## 2022-11-15 MED ORDER — OLMESARTAN MEDOXOMIL 40 MG PO TABS: 40.0000 mg | ORAL_TABLET | Freq: Every day | ORAL | 3 refills | Status: DC

## 2022-11-15 NOTE — Patient Instructions (Signed)
I recommend following back up with the urologist about your overactive bladder I placed a sleep medicine referral to look for sleep apnea.  Sleep Apnea Sleep apnea is a condition in which breathing pauses or becomes shallow during sleep. People with sleep apnea usually snore loudly. They may have times when they gasp and stop breathing for 10 seconds or more during sleep. This may happen many times during the night. Sleep apnea disrupts your sleep and keeps your body from getting the rest that it needs. This condition can increase your risk of certain health problems, including: Heart attack. Stroke. Obesity. Type 2 diabetes. Heart failure. Irregular heartbeat. High blood pressure. The goal of treatment is to help you breathe normally again. What are the causes?  The most common cause of sleep apnea is a collapsed or blocked airway. There are three kinds of sleep apnea: Obstructive sleep apnea. This kind is caused by a blocked or collapsed airway. Central sleep apnea. This kind happens when the part of the brain that controls breathing does not send the correct signals to the muscles that control breathing. Mixed sleep apnea. This is a combination of obstructive and central sleep apnea. What increases the risk? You are more likely to develop this condition if you: Are overweight. Smoke. Have a smaller than normal airway. Are older. Are male. Drink alcohol. Take sedatives or tranquilizers. Have a family history of sleep apnea. Have a tongue or tonsils that are larger than normal. What are the signs or symptoms? Symptoms of this condition include: Trouble staying asleep. Loud snoring. Morning headaches. Waking up gasping. Dry mouth or sore throat in the morning. Daytime sleepiness and tiredness. If you have daytime fatigue because of sleep apnea, you may be more likely to have: Trouble concentrating. Forgetfulness. Irritability or mood swings. Personality changes. Feelings  of depression. Sexual dysfunction. This may include loss of interest if you are male, or erectile dysfunction if you are male. How is this diagnosed? This condition may be diagnosed with: A medical history. A physical exam. A series of tests that are done while you are sleeping (sleep study). These tests are usually done in a sleep lab, but they may also be done at home. How is this treated? Treatment for this condition aims to restore normal breathing and to ease symptoms during sleep. It may involve managing health issues that can affect breathing, such as high blood pressure or obesity. Treatment may include: Sleeping on your side. Using a decongestant if you have nasal congestion. Avoiding the use of depressants, including alcohol, sedatives, and narcotics. Losing weight if you are overweight. Making changes to your diet. Quitting smoking. Using a device to open your airway while you sleep, such as: An oral appliance. This is a custom-made mouthpiece that shifts your lower jaw forward. A continuous positive airway pressure (CPAP) device. This device blows air through a mask when you breathe out (exhale). A nasal expiratory positive airway pressure (EPAP) device. This device has valves that you put into each nostril. A bi-level positive airway pressure (BIPAP) device. This device blows air through a mask when you breathe in (inhale) and breathe out (exhale). Having surgery if other treatments do not work. During surgery, excess tissue is removed to create a wider airway. Follow these instructions at home: Lifestyle Make any lifestyle changes that your health care provider recommends. Eat a healthy, well-balanced diet. Take steps to lose weight if you are overweight. Avoid using depressants, including alcohol, sedatives, and narcotics. Do not use any  products that contain nicotine or tobacco. These products include cigarettes, chewing tobacco, and vaping devices, such as e-cigarettes.  If you need help quitting, ask your health care provider. General instructions Take over-the-counter and prescription medicines only as told by your health care provider. If you were given a device to open your airway while you sleep, use it only as told by your health care provider. If you are having surgery, make sure to tell your health care provider you have sleep apnea. You may need to bring your device with you. Keep all follow-up visits. This is important. Contact a health care provider if: The device that you received to open your airway during sleep is uncomfortable or does not seem to be working. Your symptoms do not improve. Your symptoms get worse. Get help right away if: You develop: Chest pain. Shortness of breath. Discomfort in your back, arms, or stomach. You have: Trouble speaking. Weakness on one side of your body. Drooping in your face. These symptoms may represent a serious problem that is an emergency. Do not wait to see if the symptoms will go away. Get medical help right away. Call your local emergency services (911 in the [place] Do not drive yourself to the hospital. Summary Sleep apnea is a condition in which breathing pauses or becomes shallow during sleep. The most common cause is a collapsed or blocked airway. The goal of treatment is to restore normal breathing and to ease symptoms during sleep. This information is not intended to replace advice given to you by your health care provider. Make sure you discuss any questions you have with your health care provider. Document Revised: 03/10/2021 Document Reviewed: 07/10/2020 Elsevier Patient Education  [place].

## 2022-11-15 NOTE — Progress Notes (Signed)
Subjective: CC: Sleep difficulties PCP: [name], DO PF:[id number] D Wilsie Cirelli. is a 82 y.o. male presenting to clinic today for:  1.  Sleep difficulties Patient reports frequent nighttime awakenings secondary to overactive bladder.  He has been trialed on multiple medications in the past but nothing has really worked for him.  He has a history of prostate cancer that was treated.  His spouse has noticed snoring and this concerns her for an undiagnosed sleep apnea.  He reports his son has sleep apnea and is treated with CPAP machine.   ROS: Per HPI  No Known Allergies Past Medical History:  Diagnosis Date   Adenomatous polyp of colon    Arthritis    bilateral knees   Atypical mole 2016   left shoulder sever wider shave   BCC (basal cell carcinoma of skin) 2011   left neck tx mohs   BCC (basal cell carcinoma of skin) 2015   left sideburn tx with bx   BCC (basal cell carcinoma of skin) 2015   right post shoulder tx with bx   BCC (basal cell carcinoma of skin) 2016   left chest tx with bx   BCC (basal cell carcinoma of skin) 2017   left cheek cx3 61fu   CKD (chronic kidney disease), stage II    Dr. 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 Laurance Flatten.PCP   History of basal cell cancer 2011   right inner eye tx mohs   History of DVT of lower extremity    2011--  LEFT LEG   History of kidney stones    History of melanoma excision    RIGHT ARM   Hyperlipidemia    Hypertension    Impaired hearing    hearing aids bilateral   Macular degeneration    Melanoma 1990   arm and back per patient  -Melanoma   Near syncope 08/25/2015   Dr. Roland Rack once"found blood clots in lung"   Nocturia    Prostate cancer 2014   Dr. Cherylann Parr- "seed implant" '14   Ulcer of left lower leg    recent pcp visit dr Laurance Flatten--  ordered betadine wet/dry dsg daily (03-18-2013)  [place] IN 1 WEEK    Current Outpatient Medications:    aspirin 325 MG tablet, Take 1 tablet (325 mg total) by mouth daily., Disp: 90 tablet, Rfl: 3   azelastine (ASTELIN) 0.1 % nasal spray, Place 1 spray into both nostrils 2 (two) times daily. For runny nose, Disp: 30 mL, Rfl: 12   cholecalciferol (VITAMIN D3) 25 MCG (1000 UT) tablet, Take 1,000 Units by mouth daily., Disp: , Rfl:    glucose blood (ONE TOUCH ULTRA TEST) test strip, Test up to tid. DX E11.9, Disp: 100 each, Rfl: 2   loratadine (CLARITIN) 10 MG tablet, Take 1 tablet EVERY OTHER day.  RENAL DOSING!!! Please reiterate this., Disp: 30 tablet, Rfl: 11   amLODipine (NORVASC) 5 MG tablet, Take 1 tablet (5 mg total) by mouth daily., Disp: 100 tablet, Rfl: 3   atorvastatin (LIPITOR) 10 MG tablet, Take 1 tablet (10 mg total) by mouth daily., Disp: 100 tablet, Rfl: 3   furosemide (LASIX) 20 MG tablet, TAKE 1 AND 1/2 TABLETS BY MOUTH  DAILY AS NEEDED FOR SWELLING, Disp: 150 tablet, Rfl: 3   olmesartan (BENICAR) 40 MG tablet, Take 1 tablet (40  mg total) by mouth daily., Disp: 100 tablet, Rfl: 3 Social History   Socioeconomic History   Marital status: Married    Spouse name: Janelle   Number of children: 3   Years of education: master's   Highest education level: Master's degree (e.g., MA, MS, MEng, MEd, MSW, MBA)  Occupational History   Occupation: Retail buyer    Comment: retired  Tobacco Use   Smoking status: Never   Smokeless tobacco: Never  Vaping Use   Vaping Use: Never used  Substance and Sexual Activity   Alcohol use: No   Drug use: No   Sexual activity: Not Currently  Other Topics Concern   Not on file  Social History Narrative   Patient drinks 1 cup of coffee daily.   Patient is right handed.    Patient resides locally with his wife.  He has 3 sons, 2 sons reside in [place] where the other one resides in [place].  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   Social Determinants of Health   Financial Resource Strain: Low Risk  (08/18/2022)   Overall Financial Resource Strain (CARDIA)    Difficulty of Paying Living Expenses: Not hard at all  Food Insecurity: No Food Insecurity (08/18/2022)   Hunger Vital Sign    Worried About Running Out of Food in the Last Year: Never true    Ran Out of Food in the Last Year: Never true  Transportation Needs: No Transportation Needs (08/18/2022)   PRAPARE - Hydrologist (Medical): No    Lack of Transportation (Non-Medical): No  Physical Activity: Insufficiently Active (08/18/2022)   Exercise Vital Sign    Days of Exercise per Week: 3 days    Minutes of Exercise per Session: 30 min  Stress: No Stress Concern Present (08/18/2022)   [place]    Feeling of Stress : Not at all  Social Connections: Moderately Integrated (08/18/2022)   Social Connection and Isolation Panel [NHANES]    Frequency of Communication with Friends and Family: More than three times a week    Frequency of Social Gatherings with Friends and Family: More than three times a week    Attends Religious Services: More than 4 times per year    Active Member of [employer] or Organizations: No    Attends Archivist Meetings: Never    Marital Status: Married  Human resources officer Violence: Not At Risk (08/18/2022)   Humiliation, Afraid, Rape, and Kick questionnaire    Fear of Current or Ex-Partner: No    Emotionally Abused: No    Physically Abused: No    Sexually Abused: No   Family History  Problem Relation Age of Onset   Breast cancer Mother    Stroke Mother    Hypertension Mother    Lung cancer Father        smoked   Ulcers Father    Heart disease Brother 72       stent   Esophageal cancer Brother    Cancer Paternal Uncle        prostate   Cancer  Paternal Grandfather        prostate   Heart attack Neg Hx    Colon cancer Neg Hx    Colon polyps Neg Hx  Rectal cancer Neg Hx    Stomach cancer Neg Hx     Objective: Office vital signs reviewed. BP (!) 143/78   Pulse 81   Temp 98.2 F (36.8 C)   Ht 5\' 7"  (1.702 m)   Wt 226 lb (102.5 kg)   SpO2 95%   BMI 35.40 kg/m   Physical Examination:  General: Awake, alert, obese male, No acute distress HEENT: sclera white, MMM Neck: 18" Cardio: regular rate and rhythm, S1S2 heard, no murmurs appreciated Pulm: clear to auscultation bilaterally, no wheezes, rhonchi or rales; normal work of breathing on room air  Assessment/ Plan: 82 y.o. male   Snoring - Plan: Ambulatory referral to Sleep Studies  Sleep difficulties - Plan: Ambulatory referral to Sleep Studies  Overactive bladder  Hypertension associated with type 2 diabetes mellitus - Plan: amLODipine (NORVASC) 5 MG tablet, olmesartan (BENICAR) 40 MG tablet  Hyperlipidemia associated with type 2 diabetes mellitus - Plan: atorvastatin (LIPITOR) 10 MG tablet  Referral to sleep medicine placed for eval for obstructive sleep apnea.  STOP-BANG score is 7.  Blood pressure acceptable for age upon recheck.  Medications have been renewed  Labs coming up with nephrology so these were not ordered today.  He may follow-up in 6 months for annual physical exam with fasting labs and diabetic checkup  Orders Placed This Encounter  Procedures   Ambulatory referral to Sleep Studies    Referral Priority:   Routine    Referral Type:   Consultation    Referral Reason:   Specialty Services Required    Number of Visits Requested:   1   Meds ordered this encounter  Medications   amLODipine (NORVASC) 5 MG tablet    Sig: Take 1 tablet (5 mg total) by mouth daily.    Dispense:  100 tablet    Refill:  3    Requesting 1 year supply   atorvastatin (LIPITOR) 10 MG tablet    Sig: Take 1 tablet (10 mg total) by mouth daily.    Dispense:  100  tablet    Refill:  3    Requesting 1 year supply   furosemide (LASIX) 20 MG tablet    Sig: TAKE 1 AND 1/2 TABLETS BY MOUTH  DAILY AS NEEDED FOR SWELLING    Dispense:  150 tablet    Refill:  3    Please send a replace/new response with 100-Day Supply if appropriate to maximize member benefit. Requesting 1 year supply.   olmesartan (BENICAR) 40 MG tablet    Sig: Take 1 tablet (40 mg total) by mouth daily.    Dispense:  100 tablet    Refill:  3    Requesting 1 year supply     [name], [place] [phone]

## 2022-11-21 ENCOUNTER — Other Ambulatory Visit: Payer: Medicare Other

## 2022-11-21 DIAGNOSIS — N184 Chronic kidney disease, stage 4 (severe): Secondary | ICD-10-CM | POA: Diagnosis not present

## 2022-11-22 LAB — LAB REPORT - SCANNED: EGFR: 17

## 2022-11-25 ENCOUNTER — Telehealth: Payer: Self-pay | Admitting: Family Medicine

## 2022-11-25 NOTE — Telephone Encounter (Signed)
Pts wife called to check on status of referral for sleep. Explained to wife that Referral was sent to [facility] in [place]. Wife wants to know if referral can be sent to [facility] since its closer?

## 2022-11-30 DIAGNOSIS — D631 Anemia in chronic kidney disease: Secondary | ICD-10-CM | POA: Diagnosis not present

## 2022-11-30 DIAGNOSIS — N184 Chronic kidney disease, stage 4 (severe): Secondary | ICD-10-CM | POA: Diagnosis not present

## 2022-11-30 DIAGNOSIS — N2581 Secondary hyperparathyroidism of renal origin: Secondary | ICD-10-CM | POA: Diagnosis not present

## 2022-11-30 DIAGNOSIS — I129 Hypertensive chronic kidney disease with stage 1 through stage 4 chronic kidney disease, or unspecified chronic kidney disease: Secondary | ICD-10-CM | POA: Diagnosis not present

## 2022-12-15 ENCOUNTER — Ambulatory Visit (INDEPENDENT_AMBULATORY_CARE_PROVIDER_SITE_OTHER): Payer: Medicare Other | Admitting: Pulmonary Disease

## 2022-12-15 ENCOUNTER — Encounter: Payer: Self-pay | Admitting: Pulmonary Disease

## 2022-12-15 VITALS — BP 122/71 | HR 100 | Ht 67.0 in | Wt 226.9 lb

## 2022-12-15 DIAGNOSIS — G4733 Obstructive sleep apnea (adult) (pediatric): Secondary | ICD-10-CM | POA: Diagnosis not present

## 2022-12-15 NOTE — Progress Notes (Signed)
Subjective:    Patient ID: Ricardo Snipe., male    DOB: 08-Jun-1941, 82 y.o.   MRN: [id number]  HPI  Chief Complaint  Patient presents with   Consult    Pt consult, states that he doesn't need a sleep consult. Pt does report he snores and mouth breaths. Wakes up frequently to urinate   82 year old man referred for evaluation of sleep disordered breathing  PMH LLE DVT 2011 RLE DVT/PE  08/2015 -stopped Xarelto due to bleeding in his right eye DM-2 Hypertension Prostate cancer status post RT seeds  He reports frequent and urgent urination about 10-15 times per night.  He has a remote history of prostate cancer.  If he does not make to the bathroom, he is incontinent.  This wakes him up and causes nonrefreshing sleep. His wife has noted loud snoring and witnessed apneas.  He has been sleeping in a recliner recently due to his urinary issues and also because pain in his knee. Epworth Sleepiness Scale is 2. Bedtime is between 10 and 11 PM, sleep latency about 20 minutes he is out of bed around 8 AM feeling tired with dryness of mouth  There is no history suggestive of cataplexy, sleep paralysis or parasomnias   Past Medical History:  Diagnosis Date   Adenomatous polyp of colon    Arthritis    bilateral knees   Atypical mole 2016   left shoulder sever wider shave   BCC (basal cell carcinoma of skin) 2011   left neck tx mohs   BCC (basal cell carcinoma of skin) 2015   left sideburn tx with bx   BCC (basal cell carcinoma of skin) 2015   right post shoulder tx with bx   BCC (basal cell carcinoma of skin) 2016   left chest tx with bx   BCC (basal cell carcinoma of skin) 2017   left cheek cx3 69fu   CKD (chronic kidney disease), stage II    Dr. 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 Christell Constant.PCP   History of basal cell cancer 2011   right inner eye tx mohs   History of  DVT of lower extremity    2011--  LEFT LEG   History of kidney stones    History of melanoma excision    RIGHT ARM   Hyperlipidemia    Hypertension    Impaired hearing    hearing aids bilateral   Macular degeneration    Melanoma (HCC) 1990   arm and back per patient  -Melanoma   Near syncope 08/25/2015   Dr. Jeanella Craze once"found blood clots in lung"   Nocturia    Prostate cancer [facility]) 2014   Dr. Angela Adam- "seed implant" '14   Ulcer of left lower leg (HCC)    recent pcp visit dr Christell Constant--  ordered betadine wet/dry dsg daily (03-18-2013)  CONSULT WITH WOUND CARE CENTER IN 1 WEEK   Past Surgical History:  Procedure Laterality Date   CATARACT EXTRACTION W/PHACO Left 10/25/2021   Procedure: CATARACT EXTRACTION PHACO AND INTRAOCULAR LENS PLACEMENT (IOC);  Surgeon: Fabio Pierce, MD;  Location: [facility];  Service: Ophthalmology;  Laterality: Left;  CDE: 24.87   COLONOSCOPY     EYE SURGERY     KIDNEY STONE EXTRACTIONS  LAST ONE 1970   X2 OPEN/  X1  URETEROSCOPY   MELANOMA EXCISION RIGHT ARM  1990  POLYPECTOMY     PROSTATE BIOPSY     RADIOACTIVE SEED IMPLANT N/A 03/21/2013   Procedure: RADIOACTIVE SEED IMPLANT;  Surgeon: Anner Crete, MD;  Location: [facility];  Service: Urology;  Laterality: N/A;  87 seeds implanted     TONSILLECTOMY     TOTAL KNEE ARTHROPLASTY Right 03/21/2016   Procedure: RIGHT TOTAL KNEE ARTHROPLASTY;  Surgeon: Durene Romans, MD;  Location: [facility];  Service: Orthopedics;  Laterality: Right;    No Known Allergies  Social History   Socioeconomic History   Marital status: Married    Spouse name: Janelle   Number of children: 3   Years of education: master's   Highest education level: Master's degree (e.g., MA, MS, MEng, MEd, MSW, MBA)  Occupational History   Occupation: Press photographer    Comment: retired  Tobacco Use   Smoking status: Never   Smokeless tobacco: Never  Vaping Use   Vaping Use: Never used  Substance and Sexual  Activity   Alcohol use: No   Drug use: No   Sexual activity: Not Currently  Other Topics Concern   Not on file  Social History Narrative   Patient drinks 1 cup of coffee daily.   Patient is right handed.    Patient resides locally with his wife.  He has 3 sons, 2 sons reside in [place] where the other one resides in [place].  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   Social Determinants of Health   Financial Resource Strain: Low Risk  (08/18/2022)   Overall Financial Resource Strain (CARDIA)    Difficulty of Paying Living Expenses: Not hard at all  Food Insecurity: No Food Insecurity (08/18/2022)   Hunger Vital Sign    Worried About Running Out of Food in the Last Year: Never true    Ran Out of Food in the Last Year: Never true  Transportation Needs: No Transportation Needs (08/18/2022)   PRAPARE - Administrator, Civil Service (Medical): No    Lack of Transportation (Non-Medical): No  Physical Activity: Insufficiently Active (08/18/2022)   Exercise Vital Sign    Days of Exercise per Week: 3 days    Minutes of Exercise per Session: 30 min  Stress: No Stress Concern Present (08/18/2022)   [employer] of Occupational Health - Occupational Stress Questionnaire    Feeling of Stress : Not at all  Social Connections: Moderately Integrated (08/18/2022)   Social Connection and Isolation Panel [NHANES]    Frequency of Communication with Friends and Family: More than three times a week    Frequency of Social Gatherings with Friends and Family: More than three times a week    Attends Religious Services: More than 4 times per year    Active Member of [employer] or Organizations: No    Attends Banker Meetings: Never    Marital Status: Married  Catering manager Violence: Not At Risk (08/18/2022)   Humiliation, Afraid, Rape, and  Kick questionnaire    Fear of Current or Ex-Partner: No    Emotionally Abused: No    Physically Abused: No    Sexually Abused: No    Family History  Problem Relation Age of Onset   Breast cancer Mother    Stroke Mother    Hypertension Mother    Lung cancer Father  smoked   Ulcers Father    Heart disease Brother 2       stent   Esophageal cancer Brother    Cancer Paternal Uncle        prostate   Cancer Paternal Grandfather        prostate   Heart attack Neg Hx    Colon cancer Neg Hx    Colon polyps Neg Hx    Rectal cancer Neg Hx    Stomach cancer Neg Hx      Review of Systems Constitutional: negative for anorexia, fevers and sweats  Eyes: negative for irritation, redness and visual disturbance  Ears, nose, mouth, throat, and face: negative for earaches, epistaxis, nasal congestion and sore throat  Respiratory: negative for cough,  sputum and wheezing  Cardiovascular: negative for chest pain,orthopnea, palpitations and syncope  Gastrointestinal: negative for abdominal pain, constipation, diarrhea, melena, nausea and vomiting  Genitourinary:negative for dysuria, frequency and hematuria  Hematologic/lymphatic: negative for bleeding, easy bruising and lymphadenopathy  Musculoskeletal:negative for arthralgias, muscle weakness and stiff joints  Neurological: negative for coordination problems, gait problems, headaches and weakness  Endocrine: negative for diabetic symptoms including polydipsia, polyuria and weight loss     Objective:   Physical Exam  Gen. Pleasant, obese, in no distress, normal affect ENT - no pallor,icterus, no post nasal drip, class 2-3 airway Neck: No JVD, no thyromegaly, no carotid bruits Lungs: no use of accessory muscles, no dullness to percussion, decreased without rales or rhonchi  Cardiovascular: Rhythm regular, heart sounds  normal, no murmurs or gallops, no peripheral edema Abdomen: soft and non-tender, no hepatosplenomegaly, BS  normal. Musculoskeletal: No deformities, no cyanosis or clubbing Neuro:  alert, non focal, no tremors        Assessment & Plan:    OSA - Given excessive daytime somnolence, narrow pharyngeal exam, witnessed apneas & loud snoring, obstructive sleep apnea is very likely & an overnight polysomnogram will be scheduled as a home study. The pathophysiology of obstructive sleep apnea , it's cardiovascular consequences & modes of treatment including CPAP were discused with the patient in detail & they evidenced understanding. Pretest probability is intermediate his nocturia may be related to prostate issues but OSA may be contributing.  He is hesitant but would be willing to use a CPAP if needed

## 2022-12-15 NOTE — Patient Instructions (Signed)
Home sleep test 

## 2023-01-20 DIAGNOSIS — H353231 Exudative age-related macular degeneration, bilateral, with active choroidal neovascularization: Secondary | ICD-10-CM | POA: Diagnosis not present

## 2023-01-20 DIAGNOSIS — H35373 Puckering of macula, bilateral: Secondary | ICD-10-CM | POA: Diagnosis not present

## 2023-01-20 DIAGNOSIS — H43811 Vitreous degeneration, right eye: Secondary | ICD-10-CM | POA: Diagnosis not present

## 2023-01-30 DIAGNOSIS — D225 Melanocytic nevi of trunk: Secondary | ICD-10-CM | POA: Diagnosis not present

## 2023-01-30 DIAGNOSIS — D485 Neoplasm of uncertain behavior of skin: Secondary | ICD-10-CM | POA: Diagnosis not present

## 2023-01-30 DIAGNOSIS — D2261 Melanocytic nevi of right upper limb, including shoulder: Secondary | ICD-10-CM | POA: Diagnosis not present

## 2023-01-30 DIAGNOSIS — D2271 Melanocytic nevi of right lower limb, including hip: Secondary | ICD-10-CM | POA: Diagnosis not present

## 2023-01-30 DIAGNOSIS — Z1283 Encounter for screening for malignant neoplasm of skin: Secondary | ICD-10-CM | POA: Diagnosis not present

## 2023-02-13 ENCOUNTER — Other Ambulatory Visit: Payer: Medicare Other

## 2023-02-13 DIAGNOSIS — N184 Chronic kidney disease, stage 4 (severe): Secondary | ICD-10-CM | POA: Diagnosis not present

## 2023-02-22 DIAGNOSIS — N184 Chronic kidney disease, stage 4 (severe): Secondary | ICD-10-CM | POA: Diagnosis not present

## 2023-02-22 DIAGNOSIS — I129 Hypertensive chronic kidney disease with stage 1 through stage 4 chronic kidney disease, or unspecified chronic kidney disease: Secondary | ICD-10-CM | POA: Diagnosis not present

## 2023-02-22 DIAGNOSIS — D631 Anemia in chronic kidney disease: Secondary | ICD-10-CM | POA: Diagnosis not present

## 2023-02-22 DIAGNOSIS — N2581 Secondary hyperparathyroidism of renal origin: Secondary | ICD-10-CM | POA: Diagnosis not present

## 2023-03-02 ENCOUNTER — Institutional Professional Consult (permissible substitution): Payer: Medicare Other | Admitting: Pulmonary Disease

## 2023-04-18 ENCOUNTER — Other Ambulatory Visit: Payer: Medicare Other

## 2023-04-18 DIAGNOSIS — N184 Chronic kidney disease, stage 4 (severe): Secondary | ICD-10-CM | POA: Diagnosis not present

## 2023-04-19 DIAGNOSIS — H35373 Puckering of macula, bilateral: Secondary | ICD-10-CM | POA: Diagnosis not present

## 2023-04-19 DIAGNOSIS — H43811 Vitreous degeneration, right eye: Secondary | ICD-10-CM | POA: Diagnosis not present

## 2023-04-19 DIAGNOSIS — H353231 Exudative age-related macular degeneration, bilateral, with active choroidal neovascularization: Secondary | ICD-10-CM | POA: Diagnosis not present

## 2023-04-26 DIAGNOSIS — N185 Chronic kidney disease, stage 5: Secondary | ICD-10-CM | POA: Diagnosis not present

## 2023-04-26 DIAGNOSIS — N2581 Secondary hyperparathyroidism of renal origin: Secondary | ICD-10-CM | POA: Diagnosis not present

## 2023-04-26 DIAGNOSIS — I120 Hypertensive chronic kidney disease with stage 5 chronic kidney disease or end stage renal disease: Secondary | ICD-10-CM | POA: Diagnosis not present

## 2023-04-26 DIAGNOSIS — D631 Anemia in chronic kidney disease: Secondary | ICD-10-CM | POA: Diagnosis not present

## 2023-05-17 ENCOUNTER — Encounter: Payer: Self-pay | Admitting: Family Medicine

## 2023-05-17 ENCOUNTER — Ambulatory Visit: Payer: Medicare Other | Admitting: Family Medicine

## 2023-05-17 VITALS — BP 151/81 | HR 79 | Temp 98.6°F | Ht 67.0 in | Wt 217.0 lb

## 2023-05-17 DIAGNOSIS — I152 Hypertension secondary to endocrine disorders: Secondary | ICD-10-CM

## 2023-05-17 DIAGNOSIS — Z8546 Personal history of malignant neoplasm of prostate: Secondary | ICD-10-CM

## 2023-05-17 DIAGNOSIS — E1159 Type 2 diabetes mellitus with other circulatory complications: Secondary | ICD-10-CM

## 2023-05-17 DIAGNOSIS — N184 Chronic kidney disease, stage 4 (severe): Secondary | ICD-10-CM | POA: Diagnosis not present

## 2023-05-17 DIAGNOSIS — E785 Hyperlipidemia, unspecified: Secondary | ICD-10-CM

## 2023-05-17 DIAGNOSIS — E1122 Type 2 diabetes mellitus with diabetic chronic kidney disease: Secondary | ICD-10-CM | POA: Diagnosis not present

## 2023-05-17 DIAGNOSIS — Z0000 Encounter for general adult medical examination without abnormal findings: Secondary | ICD-10-CM

## 2023-05-17 DIAGNOSIS — Z974 Presence of external hearing-aid: Secondary | ICD-10-CM | POA: Diagnosis not present

## 2023-05-17 DIAGNOSIS — E1169 Type 2 diabetes mellitus with other specified complication: Secondary | ICD-10-CM | POA: Diagnosis not present

## 2023-05-17 DIAGNOSIS — Z0001 Encounter for general adult medical examination with abnormal findings: Secondary | ICD-10-CM

## 2023-05-17 DIAGNOSIS — H3530 Unspecified macular degeneration: Secondary | ICD-10-CM | POA: Diagnosis not present

## 2023-05-17 DIAGNOSIS — Z23 Encounter for immunization: Secondary | ICD-10-CM

## 2023-05-17 LAB — BAYER DCA HB A1C WAIVED: HB A1C (BAYER DCA - WAIVED): 5.3 % (ref 4.8–5.6)

## 2023-05-17 NOTE — Progress Notes (Signed)
Ricardo Mayer. is a 82 y.o. male presents to office today for annual physical exam examination.    Concerns today include: 1. Type 2 Diabetes with hypertension, hyperlipidemia and CKD4:  He reports compliance with his Benicar, Lasix, Lipitor and amlodipine.  His sugar has been diet controlled.  He has chronic lower extremity edema and reports frequent urination but thinks this is due to Lasix.  Recently his nephrologist added sodium bicarb twice daily.  He is tolerating this without difficulty.  He reports no visual disturbance and actually notes that his macular degeneration seems to be responding well to the shots that he is getting from Dr. Lelon Perla every 4 months.  Last eye exam: ROI completed.  Sees Dr. Lelon Perla Last foot exam: needs Last A1c:  Lab Results  Component Value Date   HGBA1C 5.5 06/03/2022   Nephropathy screen indicated?: yes Last flu, zoster and/or pneumovax:  Immunization History  Administered Date(s) Administered   Fluad Quad(high Dose 65+) 05/16/2019, 05/13/2020, 05/17/2021, 06/03/2022, 05/04/2023   Influenza Whole 05/20/2016   Influenza-Unspecified 05/15/2013, 05/15/2014, 06/09/2015   Moderna SARS-COV2 Booster Vaccination 06/07/2020, 11/12/2020, 04/28/2021, 06/15/2022, 05/04/2023   Moderna Sars-Covid-2 Vaccination 08/27/2019, 10/07/2019   Pneumococcal Conjugate-13 10/09/2013   Pneumococcal Polysaccharide-23 07/05/2018   Tdap 04/07/2011    Occupation: retired  Health Maintenance Due  Topic Date Due   DTaP/Tdap/Td (2 - Td or Tdap) 04/06/2021   HEMOGLOBIN A1C  12/03/2022   Diabetic kidney evaluation - Urine ACR  06/04/2023   Refills needed today: none  Immunization History  Administered Date(s) Administered   Fluad Quad(high Dose 65+) 05/16/2019, 05/13/2020, 05/17/2021, 06/03/2022, 05/04/2023   Influenza Whole 05/20/2016   Influenza-Unspecified 05/15/2013, 05/15/2014, 06/09/2015   Moderna SARS-COV2 Booster Vaccination 06/07/2020, 11/12/2020,  04/28/2021, 06/15/2022, 05/04/2023   Moderna Sars-Covid-2 Vaccination 08/27/2019, 10/07/2019   Pneumococcal Conjugate-13 10/09/2013   Pneumococcal Polysaccharide-23 07/05/2018   Tdap 04/07/2011   Past Medical History:  Diagnosis Date   Adenomatous polyp of colon    Arthritis    bilateral knees   Atypical mole 2016   left shoulder sever wider shave   BCC (basal cell carcinoma of skin) 2011   left neck tx mohs   BCC (basal cell carcinoma of skin) 2015   left sideburn tx with bx   BCC (basal cell carcinoma of skin) 2015   right post shoulder tx with bx   BCC (basal cell carcinoma of skin) 2016   left chest tx with bx   BCC (basal cell carcinoma of skin) 2017   left cheek cx3 73fu   CKD (chronic kidney disease), stage II    Dr. 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 Christell Constant.PCP   History of basal cell cancer 2011   right inner eye tx mohs   History of DVT of lower extremity    2011--  LEFT LEG   History of kidney stones    History of melanoma excision    RIGHT ARM   Hyperlipidemia    Hypertension    Impaired hearing    hearing aids bilateral   Macular degeneration    Melanoma (HCC) 1990   arm and back per patient  -Melanoma   Near syncope 08/25/2015   Dr. Jeanella Craze once"found blood clots in lung"   Nocturia    Prostate cancer [facility]) 2014   Dr. Angela Adam- "seed implant" '14   Ulcer of  left lower leg (HCC)    recent pcp visit dr Christell Constant--  ordered betadine wet/dry dsg daily (03-18-2013)  CON[facility]ULT WITH [facility] IN 1 WEEK   Social History   Socioeconomic History   Marital status: Married    Spouse name: Janelle   Number of children: 3   Years of education: master's   Highest education level: Master's degree (e.g., MA, MS, MEng, MEd, MSW, MBA)  Occupational History   Occupation: Press photographer    Comment: retired  Tobacco Use   Smoking status:  Never   Smokeless tobacco: Never  Vaping Use   Vaping status: Never Used  Substance and Sexual Activity   Alcohol use: No   Drug use: No   Sexual activity: Not Currently  Other Topics Concern   Not on file  Social History Narrative   Patient drinks 1 cup of coffee daily.   Patient is right handed.    Patient resides locally with his wife.  He has 3 sons, 2 sons reside in [place] where the other one resides in [place].  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   Social Determinants of Health   Financial Resource Strain: Low Risk  (08/18/2022)   Overall Financial Resource Strain (CARDIA)    Difficulty of Paying Living Expenses: Not hard at all  Food Insecurity: No Food Insecurity (08/18/2022)   Hunger Vital Sign    Worried About Running Out of Food in the Last Year: Never true    Ran Out of Food in the Last Year: Never true  Transportation Needs: No Transportation Needs (08/18/2022)   PRAPARE - Administrator, Civil Service (Medical): No    Mayer of Transportation (Non-Medical): No  Physical Activity: Insufficiently Active (08/18/2022)   Exercise Vital Sign    Days of Exercise per Week: 3 days    Minutes of Exercise per Session: 30 min  Stress: No Stress Concern Present (08/18/2022)   [employer] of Occupational Health - Occupational Stress Questionnaire    Feeling of Stress : Not at all  Social Connections: Moderately Integrated (08/18/2022)   Social Connection and Isolation Panel [NHANES]    Frequency of Communication with Friends and Family: More than three times a week    Frequency of Social Gatherings with Friends and Family: More than three times a week    Attends Religious Services: More than 4 times per year    Active Member of [employer] or Organizations: No    Attends Banker Meetings: Never    Marital  Status: Married  Catering manager Violence: Not At Risk (08/18/2022)   Humiliation, Afraid, Rape, and Kick questionnaire    Fear of Current or Ex-Partner: No    Emotionally Abused: No    Physically Abused: No    Sexually Abused: No   Past Surgical History:  Procedure Laterality Date   CATARACT EXTRACTION W/PHACO Left 10/25/2021   Procedure: CATARACT EXTRACTION PHACO AND INTRAOCULAR LENS PLACEMENT (IOC);  Surgeon: Fabio Pierce, MD;  Location: [facility];  Service: Ophthalmology;  Laterality: Left;  CDE: 24.87   COLONOSCOPY     EYE SURGERY     KIDNEY STONE EXTRACTIONS  LAST ONE 1970   X2 OPEN/  X1  URETEROSCOPY   MELANOMA EXCISION RIGHT ARM  1990   POLYPECTOMY     PROSTATE BIOPSY  RADIOACTIVE SEED IMPLANT N/A 03/21/2013   Procedure: RADIOACTIVE SEED IMPLANT;  Surgeon: Anner Crete, MD;  Location: [facility];  Service: Urology;  Laterality: N/A;  87 seeds implanted     TONSILLECTOMY     TOTAL KNEE ARTHROPLASTY Right 03/21/2016   Procedure: RIGHT TOTAL KNEE ARTHROPLASTY;  Surgeon: Durene Romans, MD;  Location: [facility];  Service: Orthopedics;  Laterality: Right;   Family History  Problem Relation Age of Onset   Breast cancer Mother    Stroke Mother    Hypertension Mother    Lung cancer Father        smoked   Ulcers Father    Heart disease Brother 59       stent   Esophageal cancer Brother    Cancer Paternal Uncle        prostate   Cancer Paternal Grandfather        prostate   Heart attack Neg Hx    Colon cancer Neg Hx    Colon polyps Neg Hx    Rectal cancer Neg Hx    Stomach cancer Neg Hx     Current Outpatient Medications:    amLODipine (NORVASC) 5 MG tablet, Take 1 tablet (5 mg total) by mouth daily., Disp: 100 tablet, Rfl: 3   aspirin 325 MG tablet, Take 1 tablet (325 mg total) by mouth daily., Disp: 90 tablet, Rfl: 3   atorvastatin (LIPITOR) 10 MG tablet, Take 1 tablet (10 mg total) by mouth daily., Disp: 100 tablet, Rfl: 3   azelastine (ASTELIN) 0.1 % nasal  spray, Place 1 spray into both nostrils 2 (two) times daily. For runny nose, Disp: 30 mL, Rfl: 12   cholecalciferol (VITAMIN D3) 25 MCG (1000 UT) tablet, Take 1,000 Units by mouth daily., Disp: , Rfl:    doxazosin (CARDURA) 1 MG tablet, Take 1 mg by mouth at bedtime., Disp: , Rfl:    furosemide (LASIX) 20 MG tablet, TAKE 1 AND 1/2 TABLETS BY MOUTH  DAILY AS NEEDED FOR SWELLING, Disp: 150 tablet, Rfl: 3   loratadine (CLARITIN) 10 MG tablet, Take 1 tablet EVERY OTHER day.  RENAL DOSING!!! Please reiterate this., Disp: 30 tablet, Rfl: 11   olmesartan (BENICAR) 40 MG tablet, Take 1 tablet (40 mg total) by mouth daily., Disp: 100 tablet, Rfl: 3  No Known Allergies   ROS: Review of Systems A comprehensive review of systems was negative except for: Eyes: positive for mac degeneration Ears, nose, mouth, throat, and face: positive for hearing loss and wears hearing aids Cardiovascular: positive for led edema Genitourinary: positive for frequency    Physical exam BP (!) 151/81   Pulse 79   Temp 98.6 F (37 C)   Ht 5\' 7"  (1.702 m)   Wt 217 lb (98.4 kg)   BMI 33.99 kg/m  General appearance: alert, cooperative, appears stated age, and no distress Head: Normocephalic, without obvious abnormality, atraumatic Eyes: negative findings: lids and lashes normal, conjunctivae and sclerae normal, corneas clear, and pupils equal, round, reactive to light and accomodation Ears: normal TM's and external ear canals both ears Nose: Nares normal. Septum midline. Mucosa normal. No drainage or sinus tenderness. Throat: lips, mucosa, and tongue normal; teeth and gums normal Neck: no adenopathy, no carotid bruit, supple, symmetrical, trachea midline, and thyroid not enlarged, symmetric, no tenderness/mass/nodules Back: symmetric, no curvature. ROM normal. No CVA tenderness. Lungs: clear to auscultation bilaterally Chest wall: no tenderness Heart: regular rate and rhythm, S1, S2 normal, no murmur, click, rub or  gallop Abdomen: soft, non-tender; bowel sounds normal; no masses,  no organomegaly Extremities:  Venous stasis changes noted to bilateral lower extremities.  He has hemosiderin changes to the skin.  Edema present bilaterally to mid shins Pulses:  +1 pedal pulses Skin:  Senile purpura noted, hemosiderin deposition in lower legs.  Seborrheic keratosis noted along the right side of the temple Lymph nodes: Cervical, supraclavicular, and axillary nodes normal. Neurologic: Grossly normal except difficulty hearing.  Wears hearing aids  Diabetic Foot Exam - Simple   Simple Foot Form Diabetic Foot exam was performed with the following findings: Yes 05/17/2023  9:30 AM  Visual Inspection See comments: Yes Sensation Testing Intact to touch and monofilament testing bilaterally: Yes Pulse Check See comments: Yes Comments Pitting edema present bilaterally. Post inflammatory hyperpigmentation/ hemosiderin deposition noted bilaterally. Onychomycotic changes to all toenails. +1 pedal pulses        05/17/2023   10:06 AM 11/15/2022    3:10 PM 11/07/2022    9:29 AM  Depression screen PHQ 2/9  Decreased Interest 0 0 0  Down, Depressed, Hopeless 0 0 0  PHQ - 2 Score 0 0 0  Altered sleeping  0 0  Tired, decreased energy  0 0  Change in appetite  0 0  Feeling bad or failure about yourself   0 0  Trouble concentrating  0 0  Moving slowly or fidgety/restless  0 0  Suicidal thoughts  0 0  PHQ-9 Score  0 0  Difficult doing work/chores  Not difficult at all Not difficult at all      05/17/2023   10:06 AM 11/15/2022    3:10 PM 11/07/2022    9:29 AM 06/03/2022    8:39 AM  GAD 7 : Generalized Anxiety Score  Nervous, Anxious, on Edge 0 0 0 0  Control/stop worrying 0 0 0 0  Worry too much - different things 0 0 0 0  Trouble relaxing 0 0 0 0  Restless 0 0 0 0  Easily annoyed or irritable 0 0 0 0  Afraid - awful might happen 0 0 0 0  Total GAD 7 Score 0 0 0 0  Anxiety Difficulty Not difficult at all Not  difficult at all Not difficult at all Not difficult at all     Assessment/ Plan: Ricardo Mayer. here for annual physical exam.   Annual physical exam  Type 2 diabetes mellitus with vascular disease (HCC) - Plan: Microalbumin / creatinine urine ratio, Bayer DCA Hb A1c Waived  Chronic kidney disease (CKD), stage IV (severe) (HCC) - Plan: Microalbumin / creatinine urine ratio  Hypertension associated with type 2 diabetes mellitus (HCC) - Plan: Microalbumin / creatinine urine ratio  Hyperlipidemia associated with type 2 diabetes mellitus (HCC) - Plan: Lipid Panel, TSH  Macular degeneration of both eyes, unspecified type  History of prostate cancer - Plan: PSA  Wears hearing aid in both ears  Tetanus shot administered.  Influenza vaccination records will be obtained from pharmacy  Urine microalbumin ordered as last drawl was in 2023 with nephrology.  I reviewed his labs from nephrology and these were not repeated today  His physical exam was notable for edema.  He of course has urinary frequency secondary to the Lasix.  Blood pressure was not at goal today.  I favor increasing amlodipine if this is a persistent issue.  However, this may exacerbate lower extremity edema.  Encouraged home blood pressure checks.  Fasting lipid, TSH collected  Release of information form  for recent retinal exam  History of prostate cancer and again with urinary frequency as above.  Currently treated with doxazosin  Needs new batteries for hearing aids as he had difficulty with hearing during our exam today  Counseled on healthy lifestyle choices, including diet (rich in fruits, vegetables and lean meats and low in salt and simple carbohydrates) and exercise (at least 30 minutes of moderate physical activity daily).  Patient to follow up 4-80m for DM/ BP  Elif Yonts M. Nadine Counts, DO

## 2023-05-18 LAB — MICROALBUMIN / CREATININE URINE RATIO
Creatinine, Urine: 77 mg/dL
Microalb/Creat Ratio: 1697 mg/g{creat} — ABNORMAL HIGH (ref 0–29)
Microalbumin, Urine: 1306.4 ug/mL

## 2023-05-18 LAB — LIPID PANEL
Chol/HDL Ratio: 2.2 {ratio} (ref 0.0–5.0)
Cholesterol, Total: 104 mg/dL (ref 100–199)
HDL: 48 mg/dL (ref >39)
LDL Chol Calc (NIH): 40 mg/dL (ref 0–99)
Triglycerides: 77 mg/dL (ref 0–149)
VLDL Cholesterol Cal: 16 mg/dL (ref 5–40)

## 2023-05-18 LAB — PSA: Prostate Specific Ag, Serum: <0.1 ng/mL (ref 0.0–4.0)

## 2023-05-18 LAB — TSH: TSH: 2.24 u[IU]/mL (ref 0.450–4.500)

## 2023-05-22 NOTE — Addendum Note (Signed)
Addended by: Waynette Buttery on: 05/22/2023 04:45 PM   Modules accepted: Orders

## 2023-07-21 ENCOUNTER — Other Ambulatory Visit: Payer: Medicare Other

## 2023-07-21 DIAGNOSIS — N185 Chronic kidney disease, stage 5: Secondary | ICD-10-CM | POA: Diagnosis not present

## 2023-07-25 NOTE — Progress Notes (Unsigned)
   Acute Office Visit  Subjective:     Patient ID: Ricardo Mayer., male    DOB: Mar 19, 1941, 82 y.o.   MRN: [id number]  No chief complaint on file.   HPI  ROS Negative unless indicated in HPI    Objective:    There were no vitals taken for this visit. BP Readings from Last 3 Encounters:  05/17/23 (!) 151/81  12/15/22 122/71  11/15/22 (!) 143/78   Wt Readings from Last 3 Encounters:  05/17/23 217 lb (98.4 kg)  12/15/22 226 lb 14.2 oz (102.9 kg)  11/15/22 226 lb (102.5 kg)      Physical Exam  No results found for any visits on 07/26/23.      Assessment & Plan:  There are no diagnoses linked to this encounter. Continue healthy lifestyle choices, including diet (rich in fruits, vegetables, and lean proteins, and low in salt and simple carbohydrates) and exercise (at least 30 minutes of moderate physical activity daily).     The above assessment and management plan was discussed with the patient. The patient verbalized understanding of and has agreed to the management plan. Patient is aware to call the clinic if they develop any new symptoms or if symptoms persist or worsen. Patient is aware when to return to the clinic for a follow-up visit. Patient educated on when it is appropriate to go to the emergency department.  No follow-ups on file.  Arrie Aran Santa Lighter, [facility] [address] [place], [place] [address] [phone]  Note: This document was prepared by Reubin Milan voice dictation technology and any errors that results from this process are unintentional.

## 2023-07-26 ENCOUNTER — Ambulatory Visit: Payer: Medicare Other | Admitting: Nurse Practitioner

## 2023-07-26 ENCOUNTER — Encounter: Payer: Self-pay | Admitting: Nurse Practitioner

## 2023-07-26 VITALS — BP 151/81 | HR 90 | Temp 97.5°F | Ht 67.0 in | Wt 215.2 lb

## 2023-07-26 DIAGNOSIS — R0602 Shortness of breath: Secondary | ICD-10-CM | POA: Diagnosis not present

## 2023-07-26 DIAGNOSIS — R058 Other specified cough: Secondary | ICD-10-CM | POA: Insufficient documentation

## 2023-07-26 MED ORDER — AZITHROMYCIN 250 MG PO TABS
ORAL_TABLET | ORAL | 0 refills | Status: DC
Start: 2023-07-26 — End: 2023-08-01

## 2023-07-26 MED ORDER — METHYLPREDNISOLONE ACETATE 40 MG/ML IJ SUSP
40.0000 mg | Freq: Once | INTRAMUSCULAR | Status: AC
Start: 2023-07-26 — End: 2023-07-26
  Administered 2023-07-26: 40 mg via INTRAMUSCULAR

## 2023-07-28 DIAGNOSIS — N185 Chronic kidney disease, stage 5: Secondary | ICD-10-CM | POA: Diagnosis not present

## 2023-07-28 DIAGNOSIS — D631 Anemia in chronic kidney disease: Secondary | ICD-10-CM | POA: Diagnosis not present

## 2023-07-28 DIAGNOSIS — N2581 Secondary hyperparathyroidism of renal origin: Secondary | ICD-10-CM | POA: Diagnosis not present

## 2023-07-28 DIAGNOSIS — I129 Hypertensive chronic kidney disease with stage 1 through stage 4 chronic kidney disease, or unspecified chronic kidney disease: Secondary | ICD-10-CM | POA: Diagnosis not present

## 2023-07-28 DIAGNOSIS — N189 Chronic kidney disease, unspecified: Secondary | ICD-10-CM | POA: Diagnosis not present

## 2023-07-28 DIAGNOSIS — I120 Hypertensive chronic kidney disease with stage 5 chronic kidney disease or end stage renal disease: Secondary | ICD-10-CM | POA: Diagnosis not present

## 2023-08-01 ENCOUNTER — Encounter: Payer: Self-pay | Admitting: Nurse Practitioner

## 2023-08-01 ENCOUNTER — Ambulatory Visit (INDEPENDENT_AMBULATORY_CARE_PROVIDER_SITE_OTHER): Payer: Medicare Other | Admitting: Nurse Practitioner

## 2023-08-01 VITALS — BP 159/79 | HR 92 | Temp 97.5°F | Ht 67.0 in | Wt 215.0 lb

## 2023-08-01 DIAGNOSIS — J40 Bronchitis, not specified as acute or chronic: Secondary | ICD-10-CM

## 2023-08-01 MED ORDER — BENZONATATE 100 MG PO CAPS
100.0000 mg | ORAL_CAPSULE | Freq: Three times a day (TID) | ORAL | 0 refills | Status: DC | PRN
Start: 2023-08-01 — End: 2024-06-10

## 2023-08-01 MED ORDER — PREDNISONE 20 MG PO TABS
40.0000 mg | ORAL_TABLET | Freq: Every day | ORAL | 0 refills | Status: AC
Start: 2023-08-01 — End: 2023-08-06

## 2023-08-01 NOTE — Progress Notes (Signed)
Subjective:    Patient ID: Ricardo Snipe., male    DOB: 1941-02-21, 82 y.o.   MRN: [id number]   Chief Complaint: productive cough (Seen last Wednesday for same thing. Got a steroid shot and z pack/)   Patient was seen on 07/26/23 and was dx with bronchitis. Was prescribed a zpak and prednisone. He says cough is no better.  Cough This is a new problem. The current episode started 1 to 4 weeks ago. The problem has been waxing and waning. The problem occurs every few minutes. The cough is Productive of sputum. Pertinent negatives include no chills, ear congestion, fever, rhinorrhea, sore throat or shortness of breath. Nothing aggravates the symptoms. He has tried oral steroids (antibiotic) for the symptoms. The treatment provided mild relief.    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   Hyperlipidemia associated with type 2 diabetes mellitus (HCC) 07/13/2016   Hypertension associated with type 2 diabetes mellitus (HCC) 07/13/2016   Vitamin D deficiency 07/13/2016   Morbid obesity (HCC) 05/27/2016   Anemia of chronic disease    TIA (transient ischemic attack) 04/08/2016   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  HENT:  Negative for rhinorrhea and sore throat.   Respiratory:  Positive for cough. Negative for shortness of breath.   Cardiovascular: Negative.   Neurological: Negative.        Objective:   Physical Exam Vitals reviewed.  Constitutional:      Appearance: Normal appearance.  Cardiovascular:     Rate and Rhythm: Normal rate and regular rhythm.     Heart sounds: Normal heart sounds.  Pulmonary:     Effort: Pulmonary effort is normal.     Comments: Deep dry cough Neurological:     General: No focal deficit present.     Mental Status: He is alert and oriented to person, place, and time.  Psychiatric:        Mood and Affect: Mood normal.        Behavior: Behavior normal.    BP (!) 159/79   Pulse 92   Temp (!) 97.5 F (36.4 C) (Temporal)   Ht 5\' 7"  (1.702 m)   Wt 215 lb (97.5 kg)   SpO2 97%   BMI 33.67 kg/m         Assessment & Plan:   Ricardo Snipe. in today with chief complaint of productive cough (Seen last Wednesday for same thing. Got a steroid shot and z pack/)   1. Bronchitis (Primary)  1. Take meds as prescribed 2. Use a cool mist humidifier especially during the winter months and when heat has been humid. 3. Use saline nose sprays frequently 4. Saline irrigations of the nose can be very helpful if done frequently.  * 4X daily for 1 week*  * Use of a nettie pot can be helpful with this. Follow directions with this* 5. Drink plenty of fluids 6. Keep thermostat turn down low 7.For any cough or congestion- tessalon perles 8. For fever or aces or pains- take tylenol or ibuprofen appropriate for age and weight.  * for fevers greater than 101 orally you may alternate ibuprofen and tylenol every  3 hours.   Blood sugars may go up with prednisone-  strict low carb diet while on steroids  - predniSONE (DELTASONE) 20 MG tablet; Take 2 tablets (40 mg total) by mouth daily with breakfast for 5 days. 2 po daily for 5 days  Dispense: 10 tablet; Refill: 0 - benzonatate (TESSALON PERLES) 100 MG capsule; Take 1 capsule (100 mg total) by mouth 3 (three) times daily as needed for cough.  Dispense: 20 capsule; Refill: 0    The above assessment and management plan was discussed with the patient. The patient verbalized understanding of and has agreed to the management plan. Patient is aware to call the clinic if symptoms persist or worsen. Patient is aware when to return to the clinic for a follow-up visit. Patient educated on when it is appropriate to go to the emergency department.   Mary-Margaret Daphine Deutscher, FNP

## 2023-08-01 NOTE — Patient Instructions (Signed)

## 2023-08-21 ENCOUNTER — Ambulatory Visit (INDEPENDENT_AMBULATORY_CARE_PROVIDER_SITE_OTHER): Payer: Medicare Other

## 2023-08-21 VITALS — Ht 67.0 in | Wt 215.0 lb

## 2023-08-21 DIAGNOSIS — Z0000 Encounter for general adult medical examination without abnormal findings: Secondary | ICD-10-CM

## 2023-08-21 NOTE — Progress Notes (Signed)
 Subjective:   Ricardo Mayer. is a 83 y.o. male who presents for Medicare Annual/Subsequent preventive examination.  Visit Complete: Virtual I connected with  Ricardo Mayer. on 08/21/23 by a audio enabled telemedicine application and verified that I am speaking with the correct person using two identifiers.  Patient Location: Home  Provider Location: Home Office  Interactive audio and video telecommunications were attempted between this provider and patient, however failed, due to patient having technical difficulties OR patient did not have access to video capability.  We continued and completed visit with audio only.  I discussed the limitations of evaluation and management by telemedicine. The patient expressed understanding and agreed to proceed.  Vital Signs: Because this visit was a virtual/telehealth visit, some criteria may be missing or patient reported. Any vitals not documented were not able to be obtained and vitals that have been documented are patient reported.  Patient Medicare AWV questionnaire was completed by the patient on 08/20/23; I have confirmed that all information answered by patient is correct and no changes since this date.  Cardiac Risk Factors include: advanced age (>36men, >23 women);diabetes mellitus;dyslipidemia;hypertension;male gender     Objective:    Today's Vitals   08/21/23 1459  Weight: 215 lb (97.5 kg)  Height: 5' 7 (1.702 m)   Body mass index is 33.67 kg/m.     08/21/2023    3:13 PM 08/18/2022   11:22 AM 10/25/2021   12:08 PM 10/18/2021    9:32 AM 08/13/2021    1:32 PM 08/06/2019    9:50 AM 10/31/2018    7:57 AM  Advanced Directives  Does Patient Have a Medical Advance Directive? Yes Yes No No Yes Yes Yes  Type of Estate Agent of [place];Living will Healthcare Power of [employer];Living will   Living will;Healthcare Power of [employer] Power of [place];Living will Healthcare Power of [place];Living  will  Does patient want to make changes to medical advance directive? No - Patient declined No - Patient declined   No - Patient declined No - Patient declined   Copy of Healthcare Power of Attorney in Chart? Yes - validated most recent copy scanned in chart (See row information) Yes - validated most recent copy scanned in chart (See row information)    Yes - validated most recent copy scanned in chart (See row information)     Current Medications (verified) Outpatient Encounter Medications as of 08/21/2023  Medication Sig   amLODipine  (NORVASC ) 5 MG tablet Take 1 tablet (5 mg total) by mouth daily.   aspirin  325 MG tablet Take 1 tablet (325 mg total) by mouth daily.   atorvastatin  (LIPITOR) 10 MG tablet Take 1 tablet (10 mg total) by mouth daily.   azelastine  (ASTELIN ) 0.1 % nasal spray Place 1 spray into both nostrils 2 (two) times daily. For runny nose   benzonatate  (TESSALON  PERLES) 100 MG capsule Take 1 capsule (100 mg total) by mouth 3 (three) times daily as needed for cough.   cholecalciferol (VITAMIN D3) 25 MCG (1000 UT) tablet Take 1,000 Units by mouth daily.   doxazosin (CARDURA) 1 MG tablet Take 1 mg by mouth at bedtime.   furosemide  (LASIX ) 20 MG tablet TAKE 1 AND 1/2 TABLETS BY MOUTH  DAILY AS NEEDED FOR SWELLING   loratadine  (CLARITIN ) 10 MG tablet Take 1 tablet EVERY OTHER day.  RENAL DOSING!!! Please reiterate this.   olmesartan  (BENICAR ) 40 MG tablet Take 1 tablet (40 mg total) by mouth daily.  No facility-administered encounter medications on file as of 08/21/2023.    Allergies (verified) Patient has no known allergies.   History: Past Medical History:  Diagnosis Date   Adenomatous polyp of colon    Arthritis    bilateral knees   Atypical mole 2016   left shoulder sever wider shave   BCC (basal cell carcinoma of skin) 2011   left neck tx mohs   BCC (basal cell carcinoma of skin) 2015   left sideburn tx with bx   BCC (basal cell carcinoma of skin) 2015   right post  shoulder tx with bx   BCC (basal cell carcinoma of skin) 2016   left chest tx with bx   BCC (basal cell carcinoma of skin) 2017   left cheek cx3 48fu   Cataract    CKD (chronic kidney disease), stage II    Dr. 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 Georgina.PCP   History of basal cell cancer 2011   right inner eye tx mohs   History of DVT of lower extremity    2011--  LEFT LEG   History of kidney stones    History of melanoma excision    RIGHT ARM   Hyperlipidemia    Hypertension    Impaired hearing    hearing aids bilateral   Macular degeneration    Melanoma (HCC) 1990   arm and back per patient  -Melanoma   Near syncope 08/25/2015   Dr. Ida oncefound blood clots in lung   Nocturia    Prostate cancer [facility]) 2014   Dr. Watt manuel- seed implant '14   Ulcer of left lower leg [facility])    recent pcp visit dr georgina--  ordered betadine  wet/dry dsg daily (03-18-2013)  CONSULT WITH WOUND CARE CENTER IN 1 WEEK   Past Surgical History:  Procedure Laterality Date   CATARACT EXTRACTION W/PHACO Left 10/25/2021   Procedure: CATARACT EXTRACTION PHACO AND INTRAOCULAR LENS PLACEMENT (IOC);  Surgeon: Harrie Agent, MD;  Location: [facility];  Service: Ophthalmology;  Laterality: Left;  CDE: 24.87   COLONOSCOPY     EYE SURGERY     JOINT REPLACEMENT     KIDNEY STONE EXTRACTIONS  LAST ONE 1970   X2 OPEN/  X1  URETEROSCOPY   MELANOMA EXCISION RIGHT ARM  1990   POLYPECTOMY     PROSTATE BIOPSY     RADIOACTIVE SEED IMPLANT N/A 03/21/2013   Procedure: RADIOACTIVE SEED IMPLANT;  Surgeon: Norleen JINNY Watt, MD;  Location: [facility];  Service: Urology;  Laterality: N/A;  87 seeds implanted     TONSILLECTOMY     TOTAL KNEE ARTHROPLASTY Right 03/21/2016   Procedure: RIGHT TOTAL KNEE ARTHROPLASTY;  Surgeon: Donnice Car, MD;  Location: [facility];  Service: Orthopedics;   Laterality: Right;   Family History  Problem Relation Age of Onset   Breast cancer Mother    Stroke Mother    Hypertension Mother    Lung cancer Father        smoked   Ulcers Father    Heart disease Brother 51       stent   Esophageal cancer Brother    Cancer Paternal Uncle        prostate   Cancer Paternal Grandfather        prostate   Heart attack Neg Hx    Colon cancer  Neg Hx    Colon polyps Neg Hx    Rectal cancer Neg Hx    Stomach cancer Neg Hx    Social History   Socioeconomic History   Marital status: Married    Spouse name: Janelle   Number of children: 3   Years of education: master's   Highest education level: Master's degree (e.g., MA, MS, MEng, MEd, MSW, MBA)  Occupational History   Occupation: press photographer    Comment: retired  Tobacco Use   Smoking status: Never   Smokeless tobacco: Never  Vaping Use   Vaping status: Never Used  Substance and Sexual Activity   Alcohol use: No   Drug use: No   Sexual activity: Not Currently  Other Topics Concern   Not on file  Social History Narrative   Patient drinks 1 cup of coffee daily.   Patient is right handed.    Patient resides locally with his wife.  He has 3 sons, 2 sons reside in [place] where the other one resides in [place] .  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   Social Drivers of Corporate Investment Banker Strain: Low Risk  (08/20/2023)   Overall Financial Resource Strain (CARDIA)    Difficulty of Paying Living Expenses: Not very hard  Food Insecurity: No Food Insecurity (08/20/2023)   Hunger Vital Sign    Worried About Running Out of Food in the Last Year: Never true    Ran Out of Food in the Last Year: Never true  Transportation Needs: No Transportation Needs (08/20/2023)   PRAPARE - Administrator, Civil Service (Medical): No     Lack of Transportation (Non-Medical): No  Physical Activity: Sufficiently Active (08/20/2023)   Exercise Vital Sign    Days of Exercise per Week: 7 days    Minutes of Exercise per Session: 30 min  Stress: No Stress Concern Present (08/20/2023)   [employer] of Occupational Health - Occupational Stress Questionnaire    Feeling of Stress : Not at all  Social Connections: Socially Integrated (08/20/2023)   Social Connection and Isolation Panel [NHANES]    Frequency of Communication with Friends and Family: More than three times a week    Frequency of Social Gatherings with Friends and Family: Once a week    Attends Religious Services: More than 4 times per year    Active Member of [employer] or Organizations: Yes    Attends Engineer, Structural: More than 4 times per year    Marital Status: Married    Tobacco Counseling Counseling given: Not Answered   Clinical Intake:  Pre-visit preparation completed: Yes  Pain : No/denies pain     Diabetes: Yes CBG done?: No Did pt. bring in CBG monitor from home?: No  How often do you need to have someone help you when you read instructions, pamphlets, or other written materials from your doctor or pharmacy?: 1 - Never  Interpreter Needed?: No  Information entered by :: Charmaine Bloodgood LPN   Activities of Daily Living    08/20/2023    1:24 PM  In your present state of health, do you have any difficulty performing the following activities:  Hearing? 1  Vision? 0  Difficulty concentrating or making decisions? 0  Walking or climbing stairs? 0  Dressing or bathing? 0  Doing errands,  shopping? 0  Preparing Food and eating ? N  Using the Toilet? N  In the past six months, have you accidently leaked urine? N  Do you have problems with loss of bowel control? N  Managing your Medications? N  Managing your Finances? N  Housekeeping or managing your Housekeeping? N    Patient Care Team: [name], DO as PCP - General  (Family Medicine) Jenel Carlin POUR, MD (Inactive) as Consulting Physician (Neurology) Dann Candyce RAMAN, MD as Consulting Physician (Cardiology) Porter Andrez JONELLE DEVONNA as Physician Assistant (Dermatology) Tobie Gordy POUR, MD as Consulting Physician (Nephrology) Watt Rush, MD as Attending Physician (Urology)  Indicate any recent Medical Services you may have received from other than [facility] providers in the past year (date may be approximate).     Assessment:   This is a routine wellness examination for Ricardo Mayer.  Hearing/Vision screen Hearing Screening - Comments:: Denies hearing difficulties   Vision Screening - Comments:: Wears rx glasses - up to date with routine eye exams with Dr. Selinda Slocumb    Goals Addressed   None   Depression Screen    08/21/2023    3:10 PM 08/01/2023    9:43 AM 05/17/2023   10:06 AM 11/15/2022    3:10 PM 11/07/2022    9:29 AM 08/18/2022   11:21 AM 06/03/2022    8:39 AM  PHQ 2/9 Scores  PHQ - 2 Score 0 0 0 0 0 0 0  PHQ- 9 Score 0 0  0 0      Fall Risk    08/20/2023    1:24 PM 08/01/2023    9:43 AM 11/07/2022    9:29 AM 08/18/2022   11:19 AM 08/12/2022    7:39 PM  Fall Risk   Falls in the past year? 0 0 0 0 0  Number falls in past yr: 0  0 0   Injury with Fall? 0  0 0   Risk for fall due to : No Fall Risks  No Fall Risks No Fall Risks   Follow up Falls prevention discussed;Education provided;Falls evaluation completed  Education provided Falls prevention discussed     MEDICARE RISK AT HOME: Medicare Risk at Home Any stairs in or around the home?: (Patient-Rptd) Yes If so, are there any without handrails?: (Patient-Rptd) No Home free of loose throw rugs in walkways, pet beds, electrical cords, etc?: (Patient-Rptd) Yes Adequate lighting in your home to reduce risk of falls?: (Patient-Rptd) Yes Life alert?: (Patient-Rptd) No Use of a cane, walker or w/c?: (Patient-Rptd) Yes Grab bars in the bathroom?: (Patient-Rptd) Yes Shower chair or bench in  shower?: (Patient-Rptd) Yes Elevated toilet seat or a handicapped toilet?: (Patient-Rptd) No  TIMED UP AND GO:  Was the test performed?  No    Cognitive Function:    01/31/2022   10:24 AM 07/05/2018    9:39 AM 08/26/2015    3:11 PM 08/26/2015    3:07 PM  MMSE - Mini Mental State Exam  Orientation to time 5 5 5 5   Orientation to Place 5 5 5 5   Registration 3 3 3    Attention/ Calculation 5 5 5    Recall 2 1 3    Language- name 2 objects 2 2 2    Language- repeat 1 1 1    Language- follow 3 step command 3 3 3    Language- read & follow direction 1 1 1    Write a sentence 1 1 1    Copy design 1 1 1    Total  score 29 28 30          08/21/2023    3:14 PM 08/18/2022   11:23 AM 08/13/2021    1:31 PM 08/13/2021    1:30 PM 08/06/2019    9:55 AM  6CIT Screen  What Year? 0 points 0 points  0 points 0 points  What month? 0 points 0 points  0 points 0 points  What time? 0 points 0 points 0 points 0 points 0 points  Count back from 20 0 points 0 points 0 points 0 points 0 points  Months in reverse 0 points 0 points  0 points 0 points  Repeat phrase 0 points 0 points  0 points 2 points  Total Score 0 points 0 points  0 points 2 points    Immunizations Immunization History  Administered Date(s) Administered   Fluad Quad(high Dose 65+) 05/16/2019, 05/13/2020, 05/17/2021, 06/03/2022, 05/04/2023   Influenza Whole 05/20/2016   Influenza-Unspecified 05/15/2013, 05/15/2014, 06/09/2015   Moderna SARS-COV2 Booster Vaccination 06/07/2020, 11/12/2020, 04/28/2021, 06/15/2022, 05/04/2023   Moderna Sars-Covid-2 Vaccination 08/27/2019, 10/07/2019   Pneumococcal Conjugate-13 10/09/2013   Pneumococcal Polysaccharide-23 07/05/2018   Tdap 04/07/2011, 05/17/2023    TDAP status: Up to date  Flu Vaccine status: Up to date  Pneumococcal vaccine status: Up to date  Covid-19 vaccine status: Information provided on how to obtain vaccines.   Qualifies for Shingles Vaccine? Yes   Zostavax completed Yes    Shingrix Completed?: No.    Education has been provided regarding the importance of this vaccine. Patient has been advised to call insurance company to determine out of pocket expense if they have not yet received this vaccine. Advised may also receive vaccine at local pharmacy or Health Dept. Verbalized acceptance and understanding.  Screening Tests Health Maintenance  Topic Date Due   Zoster Vaccines- Shingrix (1 of 2) Never done   OPHTHALMOLOGY EXAM  11/26/2022   COVID-19 Vaccine (3 - Moderna risk series) 06/01/2023   Colonoscopy  12/17/2023   HEMOGLOBIN A1C  11/15/2023   Diabetic kidney evaluation - eGFR measurement  11/22/2023   Diabetic kidney evaluation - Urine ACR  05/16/2024   FOOT EXAM  05/16/2024   Medicare Annual Wellness (AWV)  08/20/2024   DTaP/Tdap/Td (3 - Td or Tdap) 05/16/2033   Pneumonia Vaccine 70+ Years old  Completed   INFLUENZA VACCINE  Completed   HPV VACCINES  Aged Out   Hepatitis C Screening  Discontinued    Health Maintenance  Health Maintenance Due  Topic Date Due   Zoster Vaccines- Shingrix (1 of 2) Never done   OPHTHALMOLOGY EXAM  11/26/2022   COVID-19 Vaccine (3 - Moderna risk series) 06/01/2023   Colonoscopy  12/17/2023    Colorectal cancer screening: No longer required.   Lung Cancer Screening: (Low Dose CT Chest recommended if Age 10-80 years, 20 pack-year currently smoking OR have quit w/in 15years.) does not qualify.   Lung Cancer Screening Referral: n/a  Additional Screening:  Hepatitis C Screening: does not qualify  Vision Screening: Recommended annual ophthalmology exams for early detection of glaucoma and other disorders of the eye. Is the patient up to date with their annual eye exam?  Yes  Who is the provider or what is the name of the office in which the patient attends annual eye exams? Dr. Selinda Slocumb  If pt is not established with a provider, would they like to be referred to a provider to establish care? No .   Dental  Screening: Recommended annual dental exams  for proper oral hygiene  Diabetic Foot Exam: Diabetic Foot Exam: Completed 05/17/23  Community Resource Referral / Chronic Care Management: CRR required this visit?  No   CCM required this visit?  No     Plan:     I have personally reviewed and noted the following in the patient's chart:   Medical and social history Use of alcohol, tobacco or illicit drugs  Current medications and supplements including opioid prescriptions. Patient is not currently taking opioid prescriptions. Functional ability and status Nutritional status Physical activity Advanced directives List of other physicians Hospitalizations, surgeries, and ER visits in previous 12 months Vitals Screenings to include cognitive, depression, and falls Referrals and appointments  In addition, I have reviewed and discussed with patient certain preventive protocols, quality metrics, and best practice recommendations. A written personalized care plan for preventive services as well as general preventive health recommendations were provided to patient.     Lavelle Pfeiffer [place], [place]   03/17/7973   After Visit Summary: (MyChart) Due to this being a telephonic visit, the after visit summary with patients personalized plan was offered to patient via MyChart   Nurse Notes: Patient states that he has been directed by nephrology that he may have to start dialysis in the future; is asking for educational information on dialysis options

## 2023-08-21 NOTE — Patient Instructions (Signed)
 Ricardo Mayer , Thank you for taking time to come for your Medicare Wellness Visit. I appreciate your ongoing commitment to your health goals. Please review the following plan we discussed and let me know if I can assist you in the future.   Referrals/Orders/Follow-Ups/Clinician Recommendations: Aim for 30 minutes of exercise or brisk walking, 6-8 glasses of water , and 5 servings of fruits and vegetables each day.  This is a list of the screening recommended for you and due dates:  Health Maintenance  Topic Date Due   Zoster (Shingles) Vaccine (1 of 2) Never done   Eye exam for diabetics  11/26/2022   COVID-19 Vaccine (3 - Moderna risk series) 06/01/2023   Colon Cancer Screening  12/17/2023   Hemoglobin A1C  11/15/2023   Yearly kidney function blood test for diabetes  11/22/2023   Yearly kidney health urinalysis for diabetes  05/16/2024   Complete foot exam   05/16/2024   Medicare Annual Wellness Visit  08/20/2024   DTaP/Tdap/Td vaccine (3 - Td or Tdap) 05/16/2033   Pneumonia Vaccine  Completed   Flu Shot  Completed   HPV Vaccine  Aged Out   Hepatitis C Screening  Discontinued    Advanced directives: (In Chart) A copy of your advanced directives are scanned into your chart should your provider ever need it.  Next Medicare Annual Wellness Visit scheduled for next year: Yes

## 2023-08-23 DIAGNOSIS — H353231 Exudative age-related macular degeneration, bilateral, with active choroidal neovascularization: Secondary | ICD-10-CM | POA: Diagnosis not present

## 2023-08-23 DIAGNOSIS — H35373 Puckering of macula, bilateral: Secondary | ICD-10-CM | POA: Diagnosis not present

## 2023-08-23 DIAGNOSIS — H2511 Age-related nuclear cataract, right eye: Secondary | ICD-10-CM | POA: Diagnosis not present

## 2023-08-23 DIAGNOSIS — H43811 Vitreous degeneration, right eye: Secondary | ICD-10-CM | POA: Diagnosis not present

## 2023-10-02 DIAGNOSIS — D225 Melanocytic nevi of trunk: Secondary | ICD-10-CM | POA: Diagnosis not present

## 2023-10-02 DIAGNOSIS — L570 Actinic keratosis: Secondary | ICD-10-CM | POA: Diagnosis not present

## 2023-10-02 DIAGNOSIS — D485 Neoplasm of uncertain behavior of skin: Secondary | ICD-10-CM | POA: Diagnosis not present

## 2023-10-02 DIAGNOSIS — I872 Venous insufficiency (chronic) (peripheral): Secondary | ICD-10-CM | POA: Diagnosis not present

## 2023-10-02 DIAGNOSIS — L821 Other seborrheic keratosis: Secondary | ICD-10-CM | POA: Diagnosis not present

## 2023-10-02 DIAGNOSIS — Z1283 Encounter for screening for malignant neoplasm of skin: Secondary | ICD-10-CM | POA: Diagnosis not present

## 2023-10-02 DIAGNOSIS — D2270 Melanocytic nevi of unspecified lower limb, including hip: Secondary | ICD-10-CM | POA: Diagnosis not present

## 2023-10-02 DIAGNOSIS — D2272 Melanocytic nevi of left lower limb, including hip: Secondary | ICD-10-CM | POA: Diagnosis not present

## 2023-10-02 DIAGNOSIS — D2260 Melanocytic nevi of unspecified upper limb, including shoulder: Secondary | ICD-10-CM | POA: Diagnosis not present

## 2023-10-02 DIAGNOSIS — L820 Inflamed seborrheic keratosis: Secondary | ICD-10-CM | POA: Diagnosis not present

## 2023-10-02 DIAGNOSIS — X32XXXD Exposure to sunlight, subsequent encounter: Secondary | ICD-10-CM | POA: Diagnosis not present

## 2023-10-16 ENCOUNTER — Other Ambulatory Visit: Payer: Medicare Other

## 2023-10-16 DIAGNOSIS — N185 Chronic kidney disease, stage 5: Secondary | ICD-10-CM | POA: Diagnosis not present

## 2023-10-23 DIAGNOSIS — I120 Hypertensive chronic kidney disease with stage 5 chronic kidney disease or end stage renal disease: Secondary | ICD-10-CM | POA: Diagnosis not present

## 2023-10-23 DIAGNOSIS — N185 Chronic kidney disease, stage 5: Secondary | ICD-10-CM | POA: Diagnosis not present

## 2023-10-23 DIAGNOSIS — N2581 Secondary hyperparathyroidism of renal origin: Secondary | ICD-10-CM | POA: Diagnosis not present

## 2023-10-23 DIAGNOSIS — D631 Anemia in chronic kidney disease: Secondary | ICD-10-CM | POA: Diagnosis not present

## 2023-10-23 DIAGNOSIS — I129 Hypertensive chronic kidney disease with stage 1 through stage 4 chronic kidney disease, or unspecified chronic kidney disease: Secondary | ICD-10-CM | POA: Diagnosis not present

## 2023-10-23 DIAGNOSIS — N189 Chronic kidney disease, unspecified: Secondary | ICD-10-CM | POA: Diagnosis not present

## 2023-11-12 ENCOUNTER — Other Ambulatory Visit: Payer: Self-pay | Admitting: Family Medicine

## 2023-11-12 DIAGNOSIS — I152 Hypertension secondary to endocrine disorders: Secondary | ICD-10-CM

## 2023-11-12 DIAGNOSIS — E1169 Type 2 diabetes mellitus with other specified complication: Secondary | ICD-10-CM

## 2023-11-15 ENCOUNTER — Ambulatory Visit: Payer: Medicare Other | Admitting: Family Medicine

## 2023-11-15 ENCOUNTER — Encounter: Payer: Self-pay | Admitting: Family Medicine

## 2023-11-15 VITALS — BP 135/66 | HR 76 | Temp 98.5°F | Ht 67.0 in | Wt 221.8 lb

## 2023-11-15 DIAGNOSIS — E1159 Type 2 diabetes mellitus with other circulatory complications: Secondary | ICD-10-CM

## 2023-11-15 DIAGNOSIS — E1169 Type 2 diabetes mellitus with other specified complication: Secondary | ICD-10-CM | POA: Diagnosis not present

## 2023-11-15 DIAGNOSIS — E785 Hyperlipidemia, unspecified: Secondary | ICD-10-CM

## 2023-11-15 DIAGNOSIS — N184 Chronic kidney disease, stage 4 (severe): Secondary | ICD-10-CM

## 2023-11-15 DIAGNOSIS — I152 Hypertension secondary to endocrine disorders: Secondary | ICD-10-CM

## 2023-11-15 LAB — BAYER DCA HB A1C WAIVED: HB A1C (BAYER DCA - WAIVED): 5.3 % (ref 4.8–5.6)

## 2023-11-15 MED ORDER — OLMESARTAN MEDOXOMIL 40 MG PO TABS: 40.0000 mg | ORAL_TABLET | Freq: Every day | ORAL | 3 refills | Status: DC

## 2023-11-15 MED ORDER — FUROSEMIDE 20 MG PO TABS: 60.0000 mg | ORAL_TABLET | Freq: Every day | ORAL | Status: DC

## 2023-11-15 MED ORDER — AMLODIPINE BESYLATE 5 MG PO TABS: 5.0000 mg | ORAL_TABLET | Freq: Every day | ORAL | 3 refills | Status: DC

## 2023-11-15 MED ORDER — ATORVASTATIN CALCIUM 10 MG PO TABS: 10.0000 mg | ORAL_TABLET | Freq: Every day | ORAL | 3 refills | Status: DC

## 2023-11-15 NOTE — Progress Notes (Signed)
 Subjective: CC:DM PCP: Raliegh Ip, DO ZOX:WRUEA D Amal Saiki. is a 83 y.o. male presenting to clinic today for:  1. Type 2 Diabetes with hypertension, hyperlipidemia w/ CKD4:  He is compliant with his Benicar, amlodipine, Lipitor.  His diabetes has been diet controlled.  Continues to follow-up with nephrology, Dr. Allena Katz, every 3 months.  He recently advanced his Lasix to 60 mg daily.  Sees Dr. Lelon Perla in [place] for his eye exams and typically sees him about every 6 months for eye injections and checkup.  Diabetes Health Maintenance Due  Topic Date Due   OPHTHALMOLOGY EXAM  11/26/2022   HEMOGLOBIN A1C  11/15/2023   FOOT EXAM  05/16/2024    Last A1c:  Lab Results  Component Value Date   HGBA1C 5.3 05/17/2023    ROS: No chest pain, shortness of breath or dizziness.  He has had some stiffness but this is his baseline.  ROS: Per HPI  No Known Allergies Past Medical History:  Diagnosis Date   Adenomatous polyp of colon    Arthritis    bilateral knees   Atypical mole 2016   left shoulder sever wider shave   BCC (basal cell carcinoma of skin) 2011   left neck tx mohs   BCC (basal cell carcinoma of skin) 2015   left sideburn tx with bx   BCC (basal cell carcinoma of skin) 2015   right post shoulder tx with bx   BCC (basal cell carcinoma of skin) 2016   left chest tx with bx   BCC (basal cell carcinoma of skin) 2017   left cheek cx3 77fu   Cataract    CKD (chronic kidney disease), stage II    Dr. 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 Christell Constant.PCP   History of basal cell cancer 2011   right inner eye tx mohs   History of DVT of lower extremity    2011--  LEFT LEG   History of kidney stones    History of melanoma excision    RIGHT ARM   Hyperlipidemia    Hypertension    Impaired hearing    hearing aids bilateral   Macular degeneration    Melanoma  (HCC) 1990   arm and back per patient  -Melanoma   Near syncope 08/25/2015   Dr. Jeanella Craze once"found blood clots in lung"   Nocturia    Prostate cancer [facility]) 2014   Dr. Angela Adam- "seed implant" '14   Ulcer of left lower leg (HCC)    recent pcp visit dr Christell Constant--  ordered betadine wet/dry dsg daily (03-18-2013)  CONSULT WITH [facility] IN 1 WEEK    Current Outpatient Medications:    amLODipine (NORVASC) 5 MG tablet, TAKE 1 TABLET BY MOUTH DAILY, Disp: 100 tablet, Rfl: 0   aspirin 325 MG tablet, Take 1 tablet (325 mg total) by mouth daily., Disp: 90 tablet, Rfl: 3   atorvastatin (LIPITOR) 10 MG tablet, TAKE 1 TABLET BY MOUTH DAILY, Disp: 100 tablet, Rfl: 0   azelastine (ASTELIN) 0.1 % nasal spray, Place 1 spray into both nostrils 2 (two) times daily. For runny nose, Disp: 30 mL, Rfl: 12   benzonatate (TESSALON PERLES) 100 MG capsule, Take 1 capsule (100 mg total) by mouth 3 (three) times daily as needed for cough., Disp: 20 capsule, Rfl: 0   cholecalciferol (VITAMIN D3) 25 MCG (  1000 UT) tablet, Take 1,000 Units by mouth daily., Disp: , Rfl:    doxazosin (CARDURA) 1 MG tablet, Take 1 mg by mouth at bedtime., Disp: , Rfl:    furosemide (LASIX) 20 MG tablet, TAKE 1 AND 1/2 TABLETS BY MOUTH  DAILY AS NEEDED FOR SWELLING, Disp: 150 tablet, Rfl: 3   loratadine (CLARITIN) 10 MG tablet, Take 1 tablet EVERY OTHER day.  RENAL DOSING!!! Please reiterate this., Disp: 30 tablet, Rfl: 11   olmesartan (BENICAR) 40 MG tablet, TAKE 1 TABLET BY MOUTH DAILY, Disp: 100 tablet, Rfl: 0 Social History   Socioeconomic History   Marital status: Married    Spouse name: Janelle   Number of children: 3   Years of education: master's   Highest education level: Master's degree (e.g., MA, MS, MEng, MEd, MSW, MBA)  Occupational History   Occupation: Press photographer    Comment: retired  Tobacco Use   Smoking status: Never   Smokeless tobacco: Never  Vaping Use   Vaping status: Never Used   Substance and Sexual Activity   Alcohol use: No   Drug use: No   Sexual activity: Not Currently  Other Topics Concern   Not on file  Social History Narrative   Patient drinks 1 cup of coffee daily.   Patient is right handed.    Patient resides locally with his wife.  He has 3 sons, 2 sons reside in [place] where the other one resides in [place].  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   Social Drivers of Corporate investment banker Strain: Low Risk  (08/20/2023)   Overall Financial Resource Strain (CARDIA)    Difficulty of Paying Living Expenses: Not very hard  Food Insecurity: No Food Insecurity (08/20/2023)   Hunger Vital Sign    Worried About Running Out of Food in the Last Year: Never true    Ran Out of Food in the Last Year: Never true  Transportation Needs: No Transportation Needs (08/20/2023)   PRAPARE - Administrator, Civil Service (Medical): No    Lack of Transportation (Non-Medical): No  Physical Activity: Sufficiently Active (08/20/2023)   Exercise Vital Sign    Days of Exercise per Week: 7 days    Minutes of Exercise per Session: 30 min  Stress: No Stress Concern Present (08/20/2023)   [employer] of Occupational Health - Occupational Stress Questionnaire    Feeling of Stress : Not at all  Social Connections: Socially Integrated (08/20/2023)   Social Connection and Isolation Panel [NHANES]    Frequency of Communication with Friends and Family: More than three times a week    Frequency of Social Gatherings with Friends and Family: Once a week    Attends Religious Services: More than 4 times per year    Active Member of [employer] or Organizations: Yes    Attends Engineer, structural: More than 4 times per year    Marital Status: Married  Catering manager Violence: Not At Risk (08/21/2023)   Humiliation,  Afraid, Rape, and Kick questionnaire    Fear of Current or Ex-Partner: No    Emotionally Abused: No    Physically Abused: No    Sexually Abused: No   Family History  Problem Relation Age of Onset   Breast cancer Mother    Stroke Mother  Hypertension Mother    Lung cancer Father        smoked   Ulcers Father    Heart disease Brother 48       stent   Esophageal cancer Brother    Cancer Paternal Uncle        prostate   Cancer Paternal Grandfather        prostate   Heart attack Neg Hx    Colon cancer Neg Hx    Colon polyps Neg Hx    Rectal cancer Neg Hx    Stomach cancer Neg Hx     Objective: Office vital signs reviewed. BP 135/66   Pulse 76   Temp 98.5 F (36.9 C)   Ht 5\' 7"  (1.702 m)   Wt 221 lb 12.8 oz (100.6 kg)   SpO2 98%   BMI 34.74 kg/m   Physical Examination:  General: Awake, alert, well nourished, elderly male.  No acute distress HEENT: Sclera white.  Moist mucous membranes Cardio: regular rate and rhythm, S1S2 heard, no murmurs appreciated Pulm: clear to auscultation bilaterally, no wheezes, rhonchi or rales; normal work of breathing on room air MSK: Very stiff gait.  He does ambulate independently but gait appears to be slightly shuffled  Assessment/ Plan: 83 y.o. male   Type 2 diabetes mellitus with vascular disease (HCC) - Plan: Bayer DCA Hb A1c Waived, Renal Function Panel  Chronic kidney disease (CKD), stage IV (severe) (HCC) - Plan: Renal Function Panel  Hypertension associated with type 2 diabetes mellitus (HCC) - Plan: amLODipine (NORVASC) 5 MG tablet, olmesartan (BENICAR) 40 MG tablet  Hyperlipidemia associated with type 2 diabetes mellitus (HCC) - Plan: atorvastatin (LIPITOR) 10 MG tablet  A1c controlled.  Check renal function.  Keep follow-ups with nephrology and his retinal specialist.  We will try and get his most recent diabetic eye exam results  Blood pressure controlled.  No changes needed.  He did not need refills today  Continue  statin as directed.  Not yet due for fasting lipid but advised to be fasting at his 69-month follow-up which will be his full physical  Raliegh Ip, DO [facility] [phone]

## 2023-11-16 LAB — RENAL FUNCTION PANEL
Albumin: 4.1 g/dL (ref 3.7–4.7)
BUN/Creatinine Ratio: 16 (ref 10–24)
BUN: 67 mg/dL — ABNORMAL HIGH (ref 8–27)
CO2: 17 mmol/L — ABNORMAL LOW (ref 20–29)
Calcium: 8.9 mg/dL (ref 8.6–10.2)
Chloride: 108 mmol/L — ABNORMAL HIGH (ref 96–106)
Creatinine, Ser: 4.25 mg/dL (ref 0.76–1.27)
Glucose: 94 mg/dL (ref 70–99)
Phosphorus: 5.1 mg/dL — ABNORMAL HIGH (ref 2.8–4.1)
Potassium: 5.2 mmol/L (ref 3.5–5.2)
Sodium: 140 mmol/L (ref 134–144)
eGFR: 13 mL/min/{1.73_m2} — ABNORMAL LOW (ref >59)

## 2023-11-17 ENCOUNTER — Encounter: Payer: Self-pay | Admitting: Family Medicine

## 2023-11-29 ENCOUNTER — Other Ambulatory Visit: Payer: Self-pay | Admitting: Family Medicine

## 2024-01-04 ENCOUNTER — Telehealth: Payer: Self-pay | Admitting: Family Medicine

## 2024-01-04 DIAGNOSIS — N184 Chronic kidney disease, stage 4 (severe): Secondary | ICD-10-CM

## 2024-01-04 DIAGNOSIS — E1159 Type 2 diabetes mellitus with other circulatory complications: Secondary | ICD-10-CM

## 2024-01-04 NOTE — Telephone Encounter (Signed)
 Copied from CRM [id number]. Topic: Clinical - Request for Lab/Test Order >> Jan 04, 2024  8:21 AM Danelle Dunning F wrote: Reason for CRM:   Patient called in stating that he has to get a renal function panel. Patient called in and stating his Nephrologist  Clevester Dally had requested he have the labs completed again via a letter he received in the mail.   Please place lab orders and contact the patient for scheduling.   Callback Number: [phone]

## 2024-01-04 NOTE — Telephone Encounter (Signed)
 Number not working. T/C to schedule apt

## 2024-01-09 ENCOUNTER — Other Ambulatory Visit

## 2024-01-09 DIAGNOSIS — N185 Chronic kidney disease, stage 5: Secondary | ICD-10-CM | POA: Diagnosis not present

## 2024-01-15 DIAGNOSIS — Z961 Presence of intraocular lens: Secondary | ICD-10-CM | POA: Diagnosis not present

## 2024-01-15 DIAGNOSIS — H353232 Exudative age-related macular degeneration, bilateral, with inactive choroidal neovascularization: Secondary | ICD-10-CM | POA: Diagnosis not present

## 2024-01-15 DIAGNOSIS — H25811 Combined forms of age-related cataract, right eye: Secondary | ICD-10-CM | POA: Diagnosis not present

## 2024-01-18 DIAGNOSIS — N185 Chronic kidney disease, stage 5: Secondary | ICD-10-CM | POA: Diagnosis not present

## 2024-01-18 DIAGNOSIS — D631 Anemia in chronic kidney disease: Secondary | ICD-10-CM | POA: Diagnosis not present

## 2024-01-18 DIAGNOSIS — N2581 Secondary hyperparathyroidism of renal origin: Secondary | ICD-10-CM | POA: Diagnosis not present

## 2024-01-18 DIAGNOSIS — I120 Hypertensive chronic kidney disease with stage 5 chronic kidney disease or end stage renal disease: Secondary | ICD-10-CM | POA: Diagnosis not present

## 2024-01-21 ENCOUNTER — Other Ambulatory Visit: Payer: Self-pay | Admitting: Family Medicine

## 2024-01-21 DIAGNOSIS — E1159 Type 2 diabetes mellitus with other circulatory complications: Secondary | ICD-10-CM

## 2024-01-21 DIAGNOSIS — E1169 Type 2 diabetes mellitus with other specified complication: Secondary | ICD-10-CM

## 2024-02-20 DIAGNOSIS — H35373 Puckering of macula, bilateral: Secondary | ICD-10-CM | POA: Diagnosis not present

## 2024-02-20 DIAGNOSIS — H43811 Vitreous degeneration, right eye: Secondary | ICD-10-CM | POA: Diagnosis not present

## 2024-02-20 DIAGNOSIS — H2511 Age-related nuclear cataract, right eye: Secondary | ICD-10-CM | POA: Diagnosis not present

## 2024-02-20 DIAGNOSIS — H353232 Exudative age-related macular degeneration, bilateral, with inactive choroidal neovascularization: Secondary | ICD-10-CM | POA: Diagnosis not present

## 2024-03-12 DIAGNOSIS — H353212 Exudative age-related macular degeneration, right eye, with inactive choroidal neovascularization: Secondary | ICD-10-CM | POA: Diagnosis not present

## 2024-03-12 DIAGNOSIS — H11823 Conjunctivochalasis, bilateral: Secondary | ICD-10-CM | POA: Diagnosis not present

## 2024-03-12 DIAGNOSIS — H25811 Combined forms of age-related cataract, right eye: Secondary | ICD-10-CM | POA: Diagnosis not present

## 2024-03-12 DIAGNOSIS — H02834 Dermatochalasis of left upper eyelid: Secondary | ICD-10-CM | POA: Diagnosis not present

## 2024-03-12 DIAGNOSIS — H43811 Vitreous degeneration, right eye: Secondary | ICD-10-CM | POA: Diagnosis not present

## 2024-03-12 DIAGNOSIS — H01005 Unspecified blepharitis left lower eyelid: Secondary | ICD-10-CM | POA: Diagnosis not present

## 2024-03-12 DIAGNOSIS — H01002 Unspecified blepharitis right lower eyelid: Secondary | ICD-10-CM | POA: Diagnosis not present

## 2024-03-12 DIAGNOSIS — Z961 Presence of intraocular lens: Secondary | ICD-10-CM | POA: Diagnosis not present

## 2024-03-12 DIAGNOSIS — H01001 Unspecified blepharitis right upper eyelid: Secondary | ICD-10-CM | POA: Diagnosis not present

## 2024-03-12 DIAGNOSIS — H01004 Unspecified blepharitis left upper eyelid: Secondary | ICD-10-CM | POA: Diagnosis not present

## 2024-03-12 DIAGNOSIS — H02831 Dermatochalasis of right upper eyelid: Secondary | ICD-10-CM | POA: Diagnosis not present

## 2024-03-12 DIAGNOSIS — H35371 Puckering of macula, right eye: Secondary | ICD-10-CM | POA: Diagnosis not present

## 2024-03-25 ENCOUNTER — Encounter (HOSPITAL_COMMUNITY): Payer: Self-pay

## 2024-03-25 ENCOUNTER — Ambulatory Visit: Admitting: Family

## 2024-03-25 ENCOUNTER — Emergency Department (HOSPITAL_COMMUNITY)

## 2024-03-25 ENCOUNTER — Other Ambulatory Visit: Payer: Self-pay

## 2024-03-25 ENCOUNTER — Ambulatory Visit: Payer: Self-pay

## 2024-03-25 ENCOUNTER — Emergency Department (HOSPITAL_COMMUNITY)
Admission: EM | Admit: 2024-03-25 | Discharge: 2024-03-25 | Disposition: A | Discharge status: Home / Self Care | Source: Ambulatory Visit | Attending: Emergency Medicine | Admitting: Emergency Medicine

## 2024-03-25 DIAGNOSIS — M545 Low back pain, unspecified: Secondary | ICD-10-CM | POA: Diagnosis not present

## 2024-03-25 DIAGNOSIS — R109 Unspecified abdominal pain: Secondary | ICD-10-CM | POA: Diagnosis not present

## 2024-03-25 DIAGNOSIS — N189 Chronic kidney disease, unspecified: Secondary | ICD-10-CM

## 2024-03-25 DIAGNOSIS — E875 Hyperkalemia: Secondary | ICD-10-CM | POA: Diagnosis not present

## 2024-03-25 DIAGNOSIS — M549 Dorsalgia, unspecified: Secondary | ICD-10-CM | POA: Diagnosis present

## 2024-03-25 DIAGNOSIS — R350 Frequency of micturition: Secondary | ICD-10-CM | POA: Diagnosis not present

## 2024-03-25 DIAGNOSIS — Z7982 Long term (current) use of aspirin: Secondary | ICD-10-CM | POA: Diagnosis not present

## 2024-03-25 DIAGNOSIS — I2699 Other pulmonary embolism without acute cor pulmonale: Secondary | ICD-10-CM | POA: Diagnosis not present

## 2024-03-25 DIAGNOSIS — I129 Hypertensive chronic kidney disease with stage 1 through stage 4 chronic kidney disease, or unspecified chronic kidney disease: Secondary | ICD-10-CM | POA: Diagnosis not present

## 2024-03-25 DIAGNOSIS — N289 Disorder of kidney and ureter, unspecified: Secondary | ICD-10-CM | POA: Diagnosis not present

## 2024-03-25 LAB — URINALYSIS, ROUTINE W REFLEX MICROSCOPIC
Bilirubin Urine: NEGATIVE
Glucose, UA: 50 mg/dL — AB
Hgb urine dipstick: NEGATIVE
Ketones, ur: NEGATIVE mg/dL
Leukocytes,Ua: NEGATIVE
Nitrite: NEGATIVE
Protein, ur: 100 mg/dL — AB
Specific Gravity, Urine: 1.011 (ref 1.005–1.030)
pH: 6 (ref 5.0–8.0)

## 2024-03-25 LAB — CBC WITH DIFFERENTIAL/PLATELET
Abs Immature Granulocytes: 0.02 K/uL (ref 0.00–0.07)
Basophils Absolute: 0 K/uL (ref 0.0–0.1)
Basophils Relative: 0 %
Eosinophils Absolute: 0.1 K/uL (ref 0.0–0.5)
Eosinophils Relative: 1 %
HCT: 31.5 % — ABNORMAL LOW (ref 39.0–52.0)
Hemoglobin: 10.5 g/dL — ABNORMAL LOW (ref 13.0–17.0)
Immature Granulocytes: 0 %
Lymphocytes Relative: 25 %
Lymphs Abs: 1.9 K/uL (ref 0.7–4.0)
MCH: 31.5 pg (ref 26.0–34.0)
MCHC: 33.3 g/dL (ref 30.0–36.0)
MCV: 94.6 fL (ref 80.0–100.0)
Monocytes Absolute: 0.6 K/uL (ref 0.1–1.0)
Monocytes Relative: 8 %
Neutro Abs: 4.9 K/uL (ref 1.7–7.7)
Neutrophils Relative %: 66 %
Platelets: 143 K/uL — ABNORMAL LOW (ref 150–400)
RBC: 3.33 MIL/uL — ABNORMAL LOW (ref 4.22–5.81)
RDW: 14.3 % (ref 11.5–15.5)
WBC: 7.6 K/uL (ref 4.0–10.5)
nRBC: 0 % (ref 0.0–0.2)

## 2024-03-25 LAB — COMPREHENSIVE METABOLIC PANEL WITH GFR
ALT: 12 U/L (ref 0–44)
AST: 16 U/L (ref 15–41)
Albumin: 3.9 g/dL (ref 3.5–5.0)
Alkaline Phosphatase: 81 U/L (ref 38–126)
Anion gap: 10 (ref 5–15)
BUN: 68 mg/dL — ABNORMAL HIGH (ref 8–23)
CO2: 18 mmol/L — ABNORMAL LOW (ref 22–32)
Calcium: 9.1 mg/dL (ref 8.9–10.3)
Chloride: 106 mmol/L (ref 98–111)
Creatinine, Ser: 4.57 mg/dL — ABNORMAL HIGH (ref 0.61–1.24)
GFR, Estimated: 12 mL/min — ABNORMAL LOW (ref >60)
Glucose, Bld: 98 mg/dL (ref 70–99)
Potassium: 5.9 mmol/L — ABNORMAL HIGH (ref 3.5–5.1)
Sodium: 134 mmol/L — ABNORMAL LOW (ref 135–145)
Total Bilirubin: 0.6 mg/dL (ref 0.0–1.2)
Total Protein: 6.5 g/dL (ref 6.5–8.1)

## 2024-03-25 MED ORDER — MORPHINE SULFATE (PF) 4 MG/ML IV SOLN
4.0000 mg | Freq: Once | INTRAVENOUS | Status: AC
  Administered 2024-03-25 (×2): 4 mg via INTRAVENOUS
  Filled 2024-03-25: qty 1

## 2024-03-25 MED ORDER — SODIUM ZIRCONIUM CYCLOSILICATE 5 G PO PACK
10.0000 g | PACK | Freq: Once | ORAL | Status: AC
  Administered 2024-03-25 (×2): 10 g via ORAL
  Filled 2024-03-25: qty 2

## 2024-03-25 MED ORDER — LOKELMA 10 G PO PACK
10.0000 g | PACK | Freq: Every day | ORAL | 0 refills | Status: AC
Start: 2024-03-25 — End: ?

## 2024-03-25 NOTE — ED Notes (Signed)
 Patient transported to CT

## 2024-03-25 NOTE — ED Provider Notes (Signed)
 [place] EMERGENCY DEPARTMENT AT [facility] Provider Note   CSN: [id number] Arrival date & time: 03/25/24  1151     Patient presents with: Back Pain   Ricardo Mayer. is a 83 y.o. male.   Pt complains of left flank pain.  Pt reports pain began on Saturday and has gotten worse.  Patient has had kidney stones in the past and states that the pain feels like a kidney stone however Ricardo Mayer reports kidney stone pain was intermittent and his pain has been constant.  Patient reports no relief with over-the-counter medications or with topical Voltaren gel.  Patient has not noticed any rash.  Ricardo Mayer denies any cough or congestion Ricardo Mayer is not having any pain in his chest.  Patient denies any fever or chills Ricardo Mayer has not had any burning with urination.  Ricardo Mayer has not noted any blood in his urine  The history is provided by the patient and the spouse. No language interpreter was used.  Back Pain      Prior to Admission medications   Medication Sig Start Date End Date Taking? Authorizing Provider  amLODipine  (NORVASC ) 5 MG tablet TAKE 1 TABLET BY MOUTH DAILY 01/22/24   Jolinda Potter M, DO  aspirin  325 MG tablet Take 1 tablet (325 mg total) by mouth daily. 05/13/20   Jolinda Potter HERO, DO  atorvastatin  (LIPITOR) 10 MG tablet TAKE 1 TABLET BY MOUTH DAILY 01/22/24   Jolinda Potter M, DO  azelastine  (ASTELIN ) 0.1 % nasal spray Place 1 spray into both nostrils 2 (two) times daily. For runny nose 11/07/22   Jolinda Potter M, DO  benzonatate  (TESSALON  PERLES) 100 MG capsule Take 1 capsule (100 mg total) by mouth 3 (three) times daily as needed for cough. 08/01/23   Gladis Mary-Margaret, FNP  cholecalciferol (VITAMIN D3) 25 MCG (1000 UT) tablet Take 1,000 Units by mouth daily.    [provider]  doxazosin (CARDURA) 1 MG tablet Take 1 mg by mouth at bedtime. 12/02/22   [provider]  furosemide  (LASIX ) 20 MG tablet TAKE 1 AND 1/2 TABLETS BY MOUTH  DAILY AS NEEDED FOR SWELLING  11/30/23   Jolinda Potter M, DO  loratadine  (CLARITIN ) 10 MG tablet Take 1 tablet EVERY OTHER day.  RENAL DOSING!!! Please reiterate this. 11/07/22   Jolinda Potter M, DO  olmesartan  (BENICAR ) 40 MG tablet TAKE 1 TABLET BY MOUTH DAILY 01/22/24   Jolinda Potter M, DO    Allergies: Patient has no known allergies.    Review of Systems  Musculoskeletal:  Positive for back pain.  All other systems reviewed and are negative.   Updated Vital Signs BP (!) 163/83   Pulse 89   Temp 97.9 F (36.6 C) (Oral)   Resp 15   Ht 5' 7 (1.702 m)   Wt 105.2 kg   SpO2 99%   BMI 36.34 kg/m   Physical Exam Vitals and nursing note reviewed.  Constitutional:      Appearance: Ricardo Mayer is well-developed.  HENT:     Head: Normocephalic.     Mouth/Throat:     Mouth: Mucous membranes are moist.  Cardiovascular:     Rate and Rhythm: Normal rate.  Pulmonary:     [place]ffort: Pulmonary effort is normal.  Abdominal:     General: There is no distension.     Comments: Tender left flank, no rash no erythema,  Musculoskeletal:        General: Normal range of motion.     Cervical  back: Normal range of motion.     Comments: Thoracic and lumbar spine nontender  Skin:    General: Skin is warm.  Neurological:     General: No focal deficit present.     Mental Status: Ricardo Mayer is alert and oriented to person, place, and time.     (all labs ordered are listed, but only abnormal results are displayed) Labs Reviewed  URINALYSIS, ROUTINE W REFLEX MICROSCOPIC - Abnormal; Notable for the following components:      Result Value   Color, Urine STRAW (*)    Glucose, UA 50 (*)    Protein, ur 100 (*)    Bacteria, UA RARE (*)    All other components within normal limits  CBC WITH DIFFERENTIAL/PLATELET - Abnormal; Notable for the following components:   RBC 3.33 (*)    Hemoglobin 10.5 (*)    HCT 31.5 (*)    Platelets 143 (*)    All other components within normal limits  COMPREHENSIVE METABOLIC PANEL WITH GFR - Abnormal;  Notable for the following components:   Sodium 134 (*)    Potassium 5.9 (*)    CO2 18 (*)    BUN 68 (*)    Creatinine, Ser 4.57 (*)    GFR, Estimated 12 (*)    All other components within normal limits    EKG: None  Radiology: No results found.   Procedures   Medications Ordered in the ED  morphine  (PF) 4 MG/ML injection 4 mg (4 mg Intravenous Given 03/25/24 1538)                                    Medical Decision Making Patient complains of left-sided flank pain since Saturday.  Ricardo Mayer reports the pain has progressively worsened.  Amount and/or Complexity of Data Reviewed Labs: ordered. Decision-making details documented in ED Course.    Details: Labs ordered reviewed and interpreted.  Patient has an elevated potassium of 5.9 sodium is 134.  Hemoglobin is 10.5 . BuN is 68. Creatine is 4.57 Radiology: ordered.  Risk Prescription drug management. Risk Details: CT chest abdomen pelvis with contrast initially ordered to evaluate patient.  Patient has increased BUN and creatinine.  CT chest abdomen pelvis without contrast are ordered.        Final diagnoses:  Acute left-sided low back pain without sciatica    ED Discharge Orders     None      PT's care turned over to Dr. Patsey  Ct scan chest and abdomen pending     Angila Wombles K, PA-C 03/25/24 1705    Patsey Lot, MD 03/25/24 [phone]

## 2024-03-25 NOTE — Telephone Encounter (Signed)
 Pt has appt

## 2024-03-25 NOTE — ED Provider Notes (Signed)
  Physical Exam  BP (!) 172/84 (BP Location: Right Arm)   Pulse 80   Temp 97.8 F (36.6 C) (Oral)   Resp 17   Ht 5' 7 (1.702 m)   Wt 105.2 kg   SpO2 100%   BMI 36.34 kg/m   Physical Exam  Procedures  Procedures  ED Course / MDM    Medical Decision Making Amount and/or Complexity of Data Reviewed Labs: ordered. Radiology: ordered.  Risk Prescription drug management.   Received in signout.  Flank pain.  Chronic kidney disease.  Potassium mildly increased.  Will give Lokelma  here and 1 for home.  Do not think we need admission for it.  CT scan done and reassuring.  No clear cause.  Potentially musculoskeletal but there is a zoster.  Doubt cause such as PE.  Appears stable for discharge home to follow-up with PCP  Will also call Dr. Tobie, his nephrologist tomorrow.       Patsey Lot, MD 03/25/24 1940

## 2024-03-25 NOTE — Discharge Instructions (Signed)
 Your creatinine was up to 4.75.  Your potassium was 5.9.  Your EKG was reassuring however.  Call Dr. Tobie tomorrow.  You have been given a dose of Lokelma  here and a prescription for 1 to take tomorrow.  Your workup for the back was reassuring.  Watch for a rash.  Follow-up with your doctor for this.

## 2024-03-25 NOTE — Telephone Encounter (Signed)
 Appointment made for 03/26/2023 at 10:50 AM with Bari Learn FNP  FYI Only or Action Required?: FYI only for provider.  Patient was last seen in primary care on 11/15/2023 by [name], DO.  Called Nurse Triage reporting No chief complaint on file..  Symptoms began 2 days ago.  Interventions attempted: OTC medications: Aleve and Rest, hydration, or home remedies.  Symptoms are: gradually worsening.  Triage Disposition: See HCP Within 4 Hours (Or PCP Triage)  Patient/caregiver understands and will follow disposition?: Yes             Copied from CRM #[id number]. Topic: Clinical - Red Word Triage >> Mar 25, 2024  7:50 AM Delon HERO wrote: Red Word that prompted transfer to Nurse Triage: Patient's wife is calling to report back pain yesterday & today. That started yesterday. With no fall. Reporting working on Teacher, early years/pre. Not on pain medication. Reason for Disposition . [1] SEVERE back pain (e.g., excruciating, unable to do any normal activities) AND [2] not improved 2 hours after pain medicine  Answer Assessment - Initial Assessment Questions 1. ONSET: When did the pain begin? (e.g., minutes, hours, days)     Saturday night 2. LOCATION: Where does it hurt? (upper, mid or lower back)     Lower left side of back 3. SEVERITY: How bad is the pain?  (e.g., Scale 1-10; mild, moderate, or severe)     severe 4. PATTERN: Is the pain constant? (e.g., yes, no; constant, intermittent)      ---- 5. RADIATION: Does the pain shoot into your legs or somewhere else?     ---- 6. CAUSE:  What do you think is causing the back pain?      Unknown 7. BACK OVERUSE:  Any recent lifting of heavy objects, strenuous work or exercise?     No 8. MEDICINES: What have you taken so far for the pain? (e.g., nothing, acetaminophen , NSAIDS)     Aleve 9. NEUROLOGIC SYMPTOMS: Do you have any weakness, numbness, or problems with bowel/bladder control?     No 10. OTHER SYMPTOMS:  Do you have any other symptoms? (e.g., fever, abdomen pain, burning with urination, blood in urine)       Patient did state he also had some frequent urination  Protocols used: Back Pain-A-AH

## 2024-03-25 NOTE — ED Triage Notes (Signed)
 Patient arrives via POV.  C/o  back pain  only on left side . Patient states pain started Saturday evening and continues to get worse. Denies falls or injury to back.

## 2024-04-01 ENCOUNTER — Ambulatory Visit (INDEPENDENT_AMBULATORY_CARE_PROVIDER_SITE_OTHER): Admitting: Nurse Practitioner

## 2024-04-01 ENCOUNTER — Emergency Department (HOSPITAL_COMMUNITY)
Admission: EM | Admit: 2024-04-01 | Discharge: 2024-04-01 | Disposition: A | Discharge status: Home / Self Care | Source: Ambulatory Visit | Attending: Emergency Medicine | Admitting: Emergency Medicine

## 2024-04-01 ENCOUNTER — Other Ambulatory Visit: Payer: Self-pay

## 2024-04-01 ENCOUNTER — Encounter: Payer: Self-pay | Admitting: Nurse Practitioner

## 2024-04-01 ENCOUNTER — Emergency Department (HOSPITAL_COMMUNITY)

## 2024-04-01 ENCOUNTER — Ambulatory Visit: Payer: Self-pay

## 2024-04-01 VITALS — BP 111/66 | HR 101 | Temp 97.8°F | Ht 67.0 in | Wt 209.4 lb

## 2024-04-01 DIAGNOSIS — R6889 Other general symptoms and signs: Secondary | ICD-10-CM | POA: Diagnosis not present

## 2024-04-01 DIAGNOSIS — K5903 Drug induced constipation: Secondary | ICD-10-CM

## 2024-04-01 DIAGNOSIS — Z743 Need for continuous supervision: Secondary | ICD-10-CM | POA: Diagnosis not present

## 2024-04-01 DIAGNOSIS — E875 Hyperkalemia: Secondary | ICD-10-CM | POA: Diagnosis not present

## 2024-04-01 DIAGNOSIS — Z7982 Long term (current) use of aspirin: Secondary | ICD-10-CM | POA: Insufficient documentation

## 2024-04-01 DIAGNOSIS — R55 Syncope and collapse: Secondary | ICD-10-CM | POA: Diagnosis not present

## 2024-04-01 DIAGNOSIS — M545 Low back pain, unspecified: Secondary | ICD-10-CM

## 2024-04-01 DIAGNOSIS — M25562 Pain in left knee: Secondary | ICD-10-CM

## 2024-04-01 DIAGNOSIS — R9389 Abnormal findings on diagnostic imaging of other specified body structures: Secondary | ICD-10-CM | POA: Diagnosis not present

## 2024-04-01 DIAGNOSIS — R404 Transient alteration of awareness: Secondary | ICD-10-CM | POA: Diagnosis not present

## 2024-04-01 LAB — CBC WITH DIFFERENTIAL/PLATELET
Abs Immature Granulocytes: 0.01 K/uL (ref 0.00–0.07)
Basophils Absolute: 0 K/uL (ref 0.0–0.1)
Basophils Relative: 0 %
Eosinophils Absolute: 0.1 K/uL (ref 0.0–0.5)
Eosinophils Relative: 1 %
HCT: 30.7 % — ABNORMAL LOW (ref 39.0–52.0)
Hemoglobin: 10.3 g/dL — ABNORMAL LOW (ref 13.0–17.0)
Immature Granulocytes: 0 %
Lymphocytes Relative: 19 %
Lymphs Abs: 1.2 K/uL (ref 0.7–4.0)
MCH: 31.5 pg (ref 26.0–34.0)
MCHC: 33.6 g/dL (ref 30.0–36.0)
MCV: 93.9 fL (ref 80.0–100.0)
Monocytes Absolute: 0.5 K/uL (ref 0.1–1.0)
Monocytes Relative: 8 %
Neutro Abs: 4.6 K/uL (ref 1.7–7.7)
Neutrophils Relative %: 72 %
Platelets: 137 K/uL — ABNORMAL LOW (ref 150–400)
RBC: 3.27 MIL/uL — ABNORMAL LOW (ref 4.22–5.81)
RDW: 14.1 % (ref 11.5–15.5)
WBC: 6.4 K/uL (ref 4.0–10.5)
nRBC: 0 % (ref 0.0–0.2)

## 2024-04-01 LAB — COMPREHENSIVE METABOLIC PANEL WITH GFR
ALT: 10 U/L (ref 0–44)
AST: 14 U/L — ABNORMAL LOW (ref 15–41)
Albumin: 3.6 g/dL (ref 3.5–5.0)
Alkaline Phosphatase: 80 U/L (ref 38–126)
Anion gap: 14 (ref 5–15)
BUN: 75 mg/dL — ABNORMAL HIGH (ref 8–23)
CO2: 18 mmol/L — ABNORMAL LOW (ref 22–32)
Calcium: 9 mg/dL (ref 8.9–10.3)
Chloride: 103 mmol/L (ref 98–111)
Creatinine, Ser: 5.51 mg/dL — ABNORMAL HIGH (ref 0.61–1.24)
GFR, Estimated: 10 mL/min — ABNORMAL LOW (ref >60)
Glucose, Bld: 111 mg/dL — ABNORMAL HIGH (ref 70–99)
Potassium: 4.5 mmol/L (ref 3.5–5.1)
Sodium: 135 mmol/L (ref 135–145)
Total Bilirubin: <0.2 mg/dL (ref 0.0–1.2)
Total Protein: 6.4 g/dL — ABNORMAL LOW (ref 6.5–8.1)

## 2024-04-01 LAB — CBG MONITORING, ED: Glucose-Capillary: 103 mg/dL — ABNORMAL HIGH (ref 70–99)

## 2024-04-01 MED ORDER — PREDNISONE 20 MG PO TABS: 40.0000 mg | ORAL_TABLET | Freq: Every day | ORAL | 0 refills | Status: AC

## 2024-04-01 MED ORDER — SODIUM CHLORIDE 0.9 % IV SOLN: INTRAVENOUS | Status: DC

## 2024-04-01 NOTE — Discharge Instructions (Signed)
 As discussed, with your diminished kidney function is very important that you follow-up with your kidney doctor.  Do not hesitate to return here.  Otherwise be sure to stay well-hydrated, monitor your condition carefully, and follow-up with your doctor.

## 2024-04-01 NOTE — Telephone Encounter (Signed)
 FYI Only or Action Required?: FYI only for provider.  Patient was last seen in primary care on 11/15/2023 by [name], DO.  Called Nurse Triage reporting Leg Pain.  Symptoms began a week ago.  Interventions attempted: Pain medication prescribed by urgent care  Symptoms are: gradually worsening.  Triage Disposition: See HCP Within 4 Hours (Or PCP Triage)  Patient/caregiver understands and will follow disposition?: Yes       Copied from CRM #[id number]. Topic: Clinical - Red Word Triage >> Apr 01, 2024  7:54 AM Larissa RAMAN wrote: [facility] that prompted transfer to Nurse Triage: lt leg pain Reason for Disposition  [1] SEVERE pain (e.g., excruciating, unable to do any normal activities) AND [2] not improved after 2 hours of pain medicine  Answer Assessment - Initial Assessment Questions Patient's wife states that they had an appointment for last Monday in the office but a power outage in office forced patient to cancel. Wife denies chest pain or difficulty breathing   1. ONSET: When did the pain start?      Ongoing but new exacerbation, was seen in UC last week.  2. LOCATION: Where is the pain located?      Left leg and back pain  3. PAIN: How bad is the pain?    (Scale 1-10; or mild, moderate, severe)     Patient is feeling pain is severe 5. CAUSE: What do you think is causing the leg pain?     Unsure if kidney stone - patient has kidney problems but is having bad left leg pain 6. OTHER SYMPTOMS: Do you have any other symptoms? (e.g., chest pain, back pain, breathing difficulty, swelling, rash, fever, numbness, weakness)     Back pain, sleeping more, lack of appetite  Protocols used: Leg Pain-A-AH

## 2024-04-01 NOTE — ED Triage Notes (Signed)
 Patient arrives via [facility], EMS reports patient was at [facility] for a follow-up visit and to recheck potassium levels. During his visit he began to get dizzy with near a syncopal episode. Denies falling or LOC.

## 2024-04-01 NOTE — ED Provider Notes (Signed)
 [place] EMERGENCY DEPARTMENT AT [facility] Provider Note   CSN: [id number] Arrival date & time: 04/01/24  1103     Patient presents with: Near Syncope   Ricardo Mayer. is a 83 y.o. male.   HPI Presents with his wife after episode of near syncope.  He was at family physicians office for follow-up, had an episode of near syncope there.  No fall, no complete loss of consciousness.  Patient currently states that he feels fine.  He does not offer chest pain before or after the event.  He was scheduled follow-up after visit here last week demonstrated low potassium values. He has otherwise been in his usual state of health, taking medication as directed.  He is accompanied by his wife who assists with the history.    Prior to Admission medications   Medication Sig Start Date End Date Taking? Authorizing Provider  amLODipine  (NORVASC ) 5 MG tablet TAKE 1 TABLET BY MOUTH DAILY 01/22/24   Jolinda Potter M, DO  aspirin  325 MG tablet Take 1 tablet (325 mg total) by mouth daily. 05/13/20   Jolinda Potter M, DO  atorvastatin  (LIPITOR) 10 MG tablet TAKE 1 TABLET BY MOUTH DAILY 01/22/24   Jolinda Potter M, DO  azelastine  (ASTELIN ) 0.1 % nasal spray Place 1 spray into both nostrils 2 (two) times daily. For runny nose 11/07/22   Jolinda Potter M, DO  benzonatate  (TESSALON  PERLES) 100 MG capsule Take 1 capsule (100 mg total) by mouth 3 (three) times daily as needed for cough. 08/01/23   Gladis Mary-Margaret, FNP  cholecalciferol (VITAMIN D3) 25 MCG (1000 UT) tablet Take 1,000 Units by mouth daily.    [provider]  doxazosin (CARDURA) 1 MG tablet Take 1 mg by mouth at bedtime. 12/02/22   [provider]  furosemide  (LASIX ) 20 MG tablet TAKE 1 AND 1/2 TABLETS BY MOUTH  DAILY AS NEEDED FOR SWELLING 11/30/23   Jolinda Potter M, DO  loratadine  (CLARITIN ) 10 MG tablet Take 1 tablet EVERY OTHER day.  RENAL DOSING!!! Please reiterate this. 11/07/22   Jolinda Potter  M, DO  olmesartan  (BENICAR ) 40 MG tablet TAKE 1 TABLET BY MOUTH DAILY 01/22/24   Jolinda Potter M, DO  predniSONE  (DELTASONE ) 20 MG tablet Take 2 tablets (40 mg total) by mouth daily with breakfast for 5 days. 2 po daily for 5 days 04/01/24 04/06/24  Gladis Mustard, FNP  sodium zirconium cyclosilicate  (LOKELMA ) 10 g PACK packet Take 10 g by mouth daily. 03/25/24   Patsey Lot, MD    Allergies: Patient has no known allergies.    Review of Systems  Updated Vital Signs BP 111/62 (BP Location: Right Arm)   Pulse 84   Temp 98.1 F (36.7 C) (Oral)   Resp 17   Ht 5' 7 (1.702 m)   Wt 95.3 kg   SpO2 100%   BMI 32.89 kg/m   Physical Exam Vitals and nursing note reviewed.  Constitutional:      General: He is not in acute distress.    Appearance: He is well-developed.  HENT:     Head: Normocephalic and atraumatic.  Eyes:     Conjunctiva/sclera: Conjunctivae normal.  Cardiovascular:     Rate and Rhythm: Normal rate and regular rhythm.  Pulmonary:     Effort: Pulmonary effort is normal. No respiratory distress.     Breath sounds: No stridor.  Abdominal:     General: There is no distension.  Skin:    General: Skin is  warm and dry.  Neurological:     Mental Status: He is alert and oriented to person, place, and time.     (all labs ordered are listed, but only abnormal results are displayed) Labs Reviewed  COMPREHENSIVE METABOLIC PANEL WITH GFR - Abnormal; Notable for the following components:      Result Value   CO2 18 (*)    Glucose, Bld 111 (*)    BUN 75 (*)    Creatinine, Ser 5.51 (*)    Total Protein 6.4 (*)    AST 14 (*)    GFR, Estimated 10 (*)    All other components within normal limits  CBC WITH DIFFERENTIAL/PLATELET - Abnormal; Notable for the following components:   RBC 3.27 (*)    Hemoglobin 10.3 (*)    HCT 30.7 (*)    Platelets 137 (*)    All other components within normal limits  CBG MONITORING, ED - Abnormal; Notable for the following  components:   Glucose-Capillary 103 (*)    All other components within normal limits    EKG: EKG Interpretation Date/Time:  Monday April 01 2024 11:21:47 EDT Ventricular Rate:  80 PR Interval:  258 QRS Duration:  142 QT Interval:  403 QTC Calculation: 465 R Axis:   -74  Text Interpretation: Sinus or ectopic atrial rhythm Prolonged PR interval Left bundle branch block Artifact in lead(s) I III aVR aVL aVF V2 Confirmed by Garrick Charleston [phone]) on 04/01/2024 12:06:15 PM  Radiology: [facility] Chest Port 1 View Result Date: 04/01/2024 CLINICAL DATA:  LH? EXAM: PORTABLE CHEST 1 VIEW COMPARISON:  10/04/2017. FINDINGS: Bilateral lung fields are clear. Bilateral costophrenic angles are clear. Note is made of elevated right hemidiaphragm. Stable cardio-mediastinal silhouette. No acute osseous abnormalities. The soft tissues are within normal limits. IMPRESSION: No active disease. Electronically Signed   By: Ree Molt M.D.   On: 04/01/2024 13:26     Procedures   Medications Ordered in the ED  0.9 %  sodium chloride  infusion ( Intravenous New Bag/Given 04/01/24 1251)                                    Medical Decision Making Elderly male with recently diagnosed low potassium presents after episode of near syncope at his physician's office.  Broad differential including arrhythmia, electrolyte abnormalities, less likely ACS given his denial of chest pain before, after or current. ECG nonischemic, cardiac 85 sinus normal pulse ox 100% room air normal  Amount and/or Complexity of Data Reviewed Independent Historian: spouse External Data Reviewed: notes.    Details: Clinic notes from today Labs: ordered. Decision-making details documented in ED Course. Radiology: ordered and independent interpretation performed. Decision-making details documented in ED Course. ECG/medicine tests: ordered and independent interpretation performed. Decision-making details documented in ED  Course.  Risk Prescription drug management. Decision regarding hospitalization. Diagnosis or treatment significantly limited by social determinants of health.   3:23 PM On repeat exam patient in no distress, potassium and all labs reviewed at length.  He remains hemodynamically unremarkable. Potassium value normal.  Notably, however, the patient's CKD has evidence for progression.  We discussed this, and they note that they have been following closely with his nephrologist, are scheduled to see him next month. With no evidence for acute changes, solely progression of disease, I contacted the patient's nephrologist to expedite follow-up, discussed return precautions, home hydration instructions with family and the patient was discharged  in stable condition.     Final diagnoses:  Near syncope    ED Discharge Orders     None          Garrick Charleston, MD 04/01/24 1524

## 2024-04-01 NOTE — Progress Notes (Addendum)
 Subjective:    Patient ID: Ricardo JONETTA Etha Mickey., male    DOB: 10/23/1940, 83 y.o.   MRN: [id number]   Chief Complaint: back pain  Patient brought in by his wife this morning. She says he is complaining of leg and back pain. Started c/o back pain that started 8 days ago. Went to [facility] ED. They did scan which was negative. He was given morphine  and was sent home. While there his potassium was 5.9 so they gave him kayxelate. Needs potassium rechecked. His wife says they gave him hydrocodone  and he has taken some of that. Today he is having back pain and left knee pain. Trouble walking.   Constipation- no bowel movement in 5 days- started miralax  yesterday.  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   Hyperlipidemia associated with type 2 diabetes mellitus (HCC) 07/13/2016   Hypertension associated with type 2 diabetes mellitus (HCC) 07/13/2016   Vitamin D  deficiency 07/13/2016   Morbid obesity (HCC) 05/27/2016   Anemia of chronic disease    TIA (transient ischemic attack) 04/08/2016   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   S/P knee replacement 03/21/2016   Anemia associated with chronic renal failure 10/28/2015   Hyperparathyroidism , secondary, non-renal (HCC) 10/28/2015   Near syncope 08/25/2015   DOE (dyspnea on exertion) 06/02/2015   Type 2 diabetes mellitus with vascular disease (HCC) 06/02/2015   Melanoma of skin (HCC) 10/09/2013   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       Objective:   Physical Exam Constitutional:      Appearance: Normal appearance.  Cardiovascular:     Rate and Rhythm: Normal rate and regular rhythm.     Heart sounds: Normal heart sounds.  Pulmonary:     Breath sounds: Normal breath sounds.  Musculoskeletal:     Comments: Walking with cane Slow with limp on left  Skin:    General: Skin is warm.  Neurological:     General: No focal deficit present.     Mental Status: He is alert and oriented to person, place, and time.  Psychiatric:        Mood and Affect: Mood normal.        Behavior: Behavior normal.    BP 111/66   Pulse (!) 101   Temp 97.8 F (36.6 C)   Ht 5' 7 (1.702 m)   Wt 209 lb 6.4 oz (95 kg)   SpO2 98%   BMI 32.80 kg/m         Assessment & Plan:   Ricardo JONETTA Etha Mickey. in today with chief complaint of Leg Pain (left)   1. Acute midline low back pain without sciatica (Primary) Moist heat Rest Steroids will  increase his blood sugar - predniSONE  (DELTASONE ) 20 MG tablet; Take 2 tablets (40 mg total) by mouth daily with breakfast for 5 days. 2  po daily for 5 days  Dispense: 10 tablet; Refill: 0  2. Acute pain of left knee Elevate legs when sitting Ice if helps  3. Hyperkalemia Labs pending - Basic Metabolic Panel  4. constipation Milk oh magnesia and 6oz prune juice Continue daily miralax  Increase fiber in diet  The above assessment and management plan was discussed with the patient. The patient verbalized understanding of and has agreed to the management plan. Patient is aware to call the clinic if symptoms persist or worsen. Patient is aware when to return to the clinic for a follow-up visit. Patient educated on when it is appropriate to go to the emergency department.   Mary-Margaret Gladis, FNP   Addendum- patient went to stand up to go to lab and felt faint. Started  shaking and fell back in chair.he was unresponsive for about 30 seconds. Took several minutes to come around. Trouble follow ing simple commands.Skin turned clammy and pale. Blood pressure dropped to 90/68. Took over 6 minutes to get back to normal. Called EMS. When left office , he was alert an oriented but weak. Last blood pressure was 102/66 HR 98.  Mary-Margaret Gladis, FNP

## 2024-04-01 NOTE — Telephone Encounter (Signed)
 Appoimtment scheduled

## 2024-04-01 NOTE — Addendum Note (Signed)
 Addended by: GLADIS MUSTARD on: 04/01/2024 10:58 AM   Modules accepted: Level of Service

## 2024-04-01 NOTE — Patient Instructions (Signed)
 Acute Back Pain, Adult Acute back pain is sudden and usually short-lived. It is often caused by an injury to the muscles and tissues in the back. The injury may result from: A muscle, tendon, or ligament getting overstretched or torn. Ligaments are tissues that connect bones to each other. Lifting something improperly can cause a back strain. Wear and tear (degeneration) of the spinal disks. Spinal disks are circular tissue that provide cushioning between the bones of the spine (vertebrae). Twisting motions, such as while playing sports or doing yard work. A hit to the back. Arthritis. You may have a physical exam, lab tests, and imaging tests to find the cause of your pain. Acute back pain usually goes away with rest and home care. Follow these instructions at home: Managing pain, stiffness, and swelling Take over-the-counter and prescription medicines only as told by your health care provider. Treatment may include medicines for pain and inflammation that are taken by mouth or applied to the skin, or muscle relaxants. Your health care provider may recommend applying ice during the first 24-48 hours after your pain starts. To do this: Put ice in a plastic bag. Place a towel between your skin and the bag. Leave the ice on for 20 minutes, 2-3 times a day. Remove the ice if your skin turns bright red. This is very important. If you cannot feel pain, heat, or cold, you have a greater risk of damage to the area. If directed, apply heat to the affected area as often as told by your health care provider. Use the heat source that your health care provider recommends, such as a moist heat pack or a heating pad. Place a towel between your skin and the heat source. Leave the heat on for 20-30 minutes. Remove the heat if your skin turns bright red. This is especially important if you are unable to feel pain, heat, or cold. You have a greater risk of getting burned. Activity  Do not stay in bed. Staying in  bed for more than 1-2 days can delay your recovery. Sit up and stand up straight. Avoid leaning forward when you sit or hunching over when you stand. If you work at a desk, sit close to it so you do not need to lean over. Keep your chin tucked in. Keep your neck drawn back, and keep your elbows bent at a 90-degree angle (right angle). Sit high and close to the steering wheel when you drive. Add lower back (lumbar) support to your car seat, if needed. Take short walks on even surfaces as soon as you are able. Try to increase the length of time you walk each day. Do not sit, drive, or stand in one place for more than 30 minutes at a time. Sitting or standing for long periods of time can put stress on your back. Do not drive or use heavy machinery while taking prescription pain medicine. Use proper lifting techniques. When you bend and lift, use positions that put less stress on your back: [place] your knees. Keep the load close to your body. Avoid twisting. Exercise regularly as told by your health care provider. Exercising helps your back heal faster and helps prevent back injuries by keeping muscles strong and flexible. Work with a physical therapist to make a safe exercise program, as recommended by your health care provider. Do any exercises as told by your physical therapist. Lifestyle Maintain a healthy weight. Extra weight puts stress on your back and makes it difficult to have good  posture. Avoid activities or situations that make you feel anxious or stressed. Stress and anxiety increase muscle tension and can make back pain worse. Learn ways to manage anxiety and stress, such as through exercise. General instructions Sleep on a firm mattress in a comfortable position. Try lying on your side with your knees slightly bent. If you lie on your back, put a pillow under your knees. Keep your head and neck in a straight line with your spine (neutral position) when using electronic equipment like  smartphones or pads. To do this: Raise your smartphone or pad to look at it instead of bending your head or neck to look down. Put the smartphone or pad at the level of your face while looking at the screen. Follow your treatment plan as told by your health care provider. This may include: Cognitive or behavioral therapy. Acupuncture or massage therapy. Meditation or yoga. Contact a health care provider if: You have pain that is not relieved with rest or medicine. You have increasing pain going down into your legs or buttocks. Your pain does not improve after 2 weeks. You have pain at night. You lose weight without trying. You have a fever or chills. You develop nausea or vomiting. You develop abdominal pain. Get help right away if: You develop new bowel or bladder control problems. You have unusual weakness or numbness in your arms or legs. You feel faint. These symptoms may represent a serious problem that is an emergency. Do not wait to see if the symptoms will go away. Get medical help right away. Call your local emergency services (911 in the [place] Do not drive yourself to the hospital. Summary Acute back pain is sudden and usually short-lived. Use proper lifting techniques. When you bend and lift, use positions that put less stress on your back. Take over-the-counter and prescription medicines only as told by your health care provider, and apply heat or ice as told. This information is not intended to replace advice given to you by your health care provider. Make sure you discuss any questions you have with your health care provider. Document Revised: 10/23/2020 Document Reviewed: 10/23/2020 Elsevier Patient Education  2024 [employer].

## 2024-04-03 ENCOUNTER — Ambulatory Visit: Admitting: Nurse Practitioner

## 2024-04-08 DIAGNOSIS — H25811 Combined forms of age-related cataract, right eye: Secondary | ICD-10-CM | POA: Diagnosis not present

## 2024-04-12 ENCOUNTER — Other Ambulatory Visit: Payer: Self-pay

## 2024-04-12 ENCOUNTER — Encounter (HOSPITAL_COMMUNITY): Payer: Self-pay

## 2024-04-12 ENCOUNTER — Encounter (HOSPITAL_COMMUNITY)
Admission: RE | Admit: 2024-04-12 | Discharge: 2024-04-12 | Disposition: A | Discharge status: Home / Self Care | Source: Ambulatory Visit | Attending: Ophthalmology | Admitting: Ophthalmology

## 2024-04-17 ENCOUNTER — Telehealth: Payer: Self-pay | Admitting: Family Medicine

## 2024-04-17 NOTE — Telephone Encounter (Signed)
 Called patient and told Mrs. Brummitt Mandy would have to call and let her know if MMM can see Mr. Dovidio.

## 2024-04-17 NOTE — Telephone Encounter (Signed)
 Can Ronal Quant work in pt tomorrow?  Copied from CRM [id number]. Topic: Appointments - Scheduling Inquiry for Clinic >> Apr 17, 2024  1:55 PM Antwanette L wrote: Reason for CRM: Jabari Swoveland (pt wife) is calling on the behalf of the patient. The patient has a wound on the back of his leg w/ no current symptoms. Janelle wants to know if Mary-Margaret can take at look at it tomorrow. Janelle h as an appt with Mary-Margaret tomorrow at 8:45 am and wants to know if he can be added to the appt. Hart is requesting a callback at  [phone]

## 2024-04-17 NOTE — H&P (Signed)
 Surgical History & Physical  Patient Name: Ricardo Mayer  DOB: 24-Jun-1941  Surgery: Cataract extraction with intraocular lens implant phacoemulsification; Right Eye Surgeon: Lynwood Hermann MD Surgery Date: 04/19/2024 Pre-Op Date: 03/12/2024  HPI: A 89 Yr. old male patient presents for a cataract evaluation OD. s/p CEIOL OD 2023. Patient reports that his vision OS is doing well. Patient states that his OD is very dim. Patient states that he has difficulty with reading small print with OD. The vision has been blurry in the right eye for several months. The vision is worsening. Patient states that he struggles with recognizing people from afar with OD. Patient states that he does not drive at night due to the vision OD. This is negatively affecting the patient's quality of life and the patient is unable to function adequately in life with the current level of vision. Patient states that he has floaters but contributes that to his injections. Patient denies any ocular pain or FOL. HPI Completed by Dr. Lynwood Hermann  Medical History: Macula Degeneration  High Blood Pressure Lung Problems  Review of Systems Negative Allergic/Immunologic Hypertension Cardiovascular Negative Constitutional Negative Ear, Nose, Mouth & Throat Lung Problems Endocrine Cataract, AMD Eyes Negative Gastrointestinal Negative Genitourinary Negative Hemotologic/Lymphatic Negative Integumentary Negative Musculoskeletal Negative Neurological Negative Psychiatry Negative Respiratory   Social Never smoked   Medication Prednisolone-moxiflox-bromfen,   Aspirin , Vitamin D3, Vitamin B-12, Amlodipine , Sodium bicarbonate, Olmesartan , Furosemide , Atorvastatin , Doxazosin  Sx/Procedures Blood clot removed behind the retina, Phaco c IOL OS, Injections for AMD OS,  Knee Replacement, Kidney stone removal, Seed implant  Drug Allergies  NKDA  History & Physical: Heent: cataracts NECK: supple without bruits LUNGS: lungs  clear to auscultation CV: regular rate and rhythm Abdomen: soft and non-tender  Impression & Plan: Assessment: 1.  COMBINED FORMS AGE RELATED CATARACT; Right Eye (H25.811) 2.  INTRAOCULAR LENS IOL (Z96.1) 3.  DERMATOCHALASIS, no surgery; Right Upper Lid, Left Upper Lid (H02.831, H02.834) 4.  BLEPHARITIS; Right Lower Lid, Left Upper Lid, Left Lower Lid, Right Upper Lid (H01.001, H01.002,H01.004,H01.005) 5.  CONJUNCTIVOCHALASIS; ,, Both Eyes (H11.823) 6.  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  1 week after cataract surgery. Doing well with improved vision and normal eye pressure. Call with any problems or concerns. Continue Pred-Moxi-Brom 2x/day for 3 more weeks.  2.  Cataract accounts for the patient's decreased vision. This visual impairment is not correctable with a tolerable change in glasses or contact lenses. Cataract surgery with an implantation of a new lens should significantly improve the visual and functional status of the patient.Discussed all risks, benefits, alternatives, and potential complications. Discussed the procedures and recovery. Patient desires to have surgery. A-scan ordered and performed today for intra-ocular lens calculations. The surgery will be performed in order to improve vision for driving, reading, and for eye examinations. Recommend phacoemulsification with intra-ocular lens. Recommend Dextenza  for post-operative pain and inflammation. History of refractive Surgery: Use of Eye Pressure Lowering Drops: Right Eye. Surgery required to correct imbalance of vision. Dilates well - shugarcaine or Lidocaine +Omidira by protocol Use Raynor Lens for balance - run new IOL Calcs with updated A-constant.  3.  Stable.  4.  Asymptomatic, recommend observation for now. Findings, prognosis and treatment options reviewed.  5.  Recommend regular lid  cleaning.  6.  Asymptomatic. Symptomatic.  7.  Old Asymptomatic. RD precautions given. Patient to call with increase in flashing lights/floaters/dark curtain.  8.  Under care of Dr. Jarold.  9.  Discussed diagnosis in detail with patient.

## 2024-04-18 ENCOUNTER — Ambulatory Visit (INDEPENDENT_AMBULATORY_CARE_PROVIDER_SITE_OTHER): Admitting: Family Medicine

## 2024-04-18 ENCOUNTER — Ambulatory Visit: Admitting: Family Medicine

## 2024-04-18 ENCOUNTER — Encounter: Payer: Self-pay | Admitting: Family Medicine

## 2024-04-18 VITALS — BP 141/74 | HR 93 | Temp 97.3°F | Ht 67.0 in | Wt 213.0 lb

## 2024-04-18 DIAGNOSIS — I872 Venous insufficiency (chronic) (peripheral): Secondary | ICD-10-CM | POA: Diagnosis not present

## 2024-04-18 DIAGNOSIS — L97212 Non-pressure chronic ulcer of right calf with fat layer exposed: Secondary | ICD-10-CM

## 2024-04-18 MED ORDER — DOXYCYCLINE HYCLATE 100 MG PO TABS: 100.0000 mg | ORAL_TABLET | Freq: Two times a day (BID) | ORAL | 0 refills | Status: AC

## 2024-04-18 NOTE — Anesthesia Preprocedure Evaluation (Signed)
 Anesthesia Evaluation  Patient identified by MRN, date of birth, ID band Patient awake    Reviewed: Allergy & Precautions, H&P , NPO status , Patient's Chart, lab work & pertinent test results, reviewed documented beta blocker date and time   Airway Mallampati: II  TM Distance: >3 FB Neck ROM: full    Dental no notable dental hx. (+) Dental Advisory Given, Teeth Intact   Pulmonary neg pulmonary ROS   Pulmonary exam normal breath sounds clear to auscultation       Cardiovascular Exercise Tolerance: Good hypertension, + Peripheral Vascular Disease and + DOE  Normal cardiovascular exam Rhythm:regular Rate:Normal     Neuro/Psych TIA negative psych ROS   GI/Hepatic negative GI ROS, Neg liver ROS,,,  Endo/Other  negative endocrine ROSdiabetes, Type 2    Renal/GU CRFRenal diseaseStage 2  negative genitourinary   Musculoskeletal  (+) Arthritis , Osteoarthritis,    Abdominal   Peds  Hematology  (+) Blood dyscrasia, anemia Clotting disorder with DVT   Anesthesia Other Findings Prostate cancer  Reproductive/Obstetrics negative OB ROS                              Anesthesia Physical Anesthesia Plan  ASA: 3  Anesthesia Plan: MAC   Post-op Pain Management: Minimal or no pain anticipated   Induction:   PONV Risk Score and Plan: Midazolam   Airway Management Planned: Natural Airway and Nasal Cannula  Additional Equipment: None  Intra-op Plan:   Post-operative Plan:   Informed Consent: I have reviewed the patients History and Physical, chart, labs and discussed the procedure including the risks, benefits and alternatives for the proposed anesthesia with the patient or authorized representative who has indicated his/her understanding and acceptance.     Dental Advisory Given  Plan Discussed with: CRNA  Anesthesia Plan Comments:          Anesthesia Quick Evaluation

## 2024-04-18 NOTE — Progress Notes (Signed)
 Subjective: CC: wound PCP: [name], DO YEP:Mnwzb D Aaiden Depoy. is a 83 y.o. male presenting to clinic today for:  Discussed the use of AI scribe software for clinical note transcription with the patient, who gave verbal consent to proceed.  History of Present Illness   Ricardo Mayer. is an 83 year old male with a history of fluid retention in his legs who presents with a non-healing leg wound.  He noticed the wound on his leg approximately two weeks ago. Despite using over-the-counter antibiotic creams, the wound has not improved. Initially, the wound was not painful, but it began to hurt after he scrubbed it in the shower last night. He currently has a large Band-Aid on the wound and confirms that the cream applied is his own.  He has a history of fluid retention in his legs. He has CKD4      ROS: Per HPI  No Known Allergies Past Medical History:  Diagnosis Date   Adenomatous polyp of colon    Arthritis    bilateral knees   Atypical mole 2016   left shoulder sever wider shave   BCC (basal cell carcinoma of skin) 2011   left neck tx mohs   BCC (basal cell carcinoma of skin) 2015   left sideburn tx with bx   BCC (basal cell carcinoma of skin) 2015   right post shoulder tx with bx   BCC (basal cell carcinoma of skin) 2016   left chest tx with bx   BCC (basal cell carcinoma of skin) 2017   left cheek cx3 63fu   Cataract    CKD (chronic kidney disease), stage II    Dr. 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 Georgina.PCP   History of basal cell cancer 2011   right inner eye tx mohs   History of DVT of lower extremity    2011--  LEFT LEG   History of kidney stones    History of melanoma excision    RIGHT ARM   Hyperlipidemia    Hypertension    Impaired hearing    hearing aids bilateral   Macular degeneration    Melanoma (HCC) 1990   arm and back  per patient  -Melanoma   Near syncope 08/25/2015   Dr. Ida oncefound blood clots in lung   Nocturia    Prostate cancer [facility]) 2014   Dr. Watt manuel- seed implant '14   Ulcer of left lower leg (HCC)    recent pcp visit dr georgina--  ordered betadine  wet/dry dsg daily (03-18-2013)  CONSULT WITH WOUND CARE CENTER IN 1 WEEK    Current Outpatient Medications:    amLODipine  (NORVASC ) 5 MG tablet, TAKE 1 TABLET BY MOUTH DAILY, Disp: 100 tablet, Rfl: 1   aspirin  325 MG tablet, Take 1 tablet (325 mg total) by mouth daily., Disp: 90 tablet, Rfl: 3   atorvastatin  (LIPITOR) 10 MG tablet, TAKE 1 TABLET BY MOUTH DAILY, Disp: 100 tablet, Rfl: 1   azelastine  (ASTELIN ) 0.1 % nasal spray, Place 1 spray into both nostrils 2 (two) times daily. For runny nose, Disp: 30 mL, Rfl: 12   benzonatate  (TESSALON  PERLES) 100 MG capsule, Take 1 capsule (100 mg total) by mouth 3 (three) times daily as needed for cough., Disp: 20 capsule, Rfl: 0   cholecalciferol (VITAMIN D3) 25 MCG (1000 UT) tablet, Take  1,000 Units by mouth daily., Disp: , Rfl:    doxazosin (CARDURA) 1 MG tablet, Take 1 mg by mouth at bedtime., Disp: , Rfl:    doxycycline  (VIBRA -TABS) 100 MG tablet, Take 1 tablet (100 mg total) by mouth 2 (two) times daily for 10 days., Disp: 20 tablet, Rfl: 0   furosemide  (LASIX ) 20 MG tablet, TAKE 1 AND 1/2 TABLETS BY MOUTH  DAILY AS NEEDED FOR SWELLING, Disp: 150 tablet, Rfl: 1   loratadine  (CLARITIN ) 10 MG tablet, Take 1 tablet EVERY OTHER day.  RENAL DOSING!!! Please reiterate this., Disp: 30 tablet, Rfl: 11   olmesartan  (BENICAR ) 40 MG tablet, TAKE 1 TABLET BY MOUTH DAILY, Disp: 100 tablet, Rfl: 1   sodium zirconium cyclosilicate  (LOKELMA ) 10 g PACK packet, Take 10 g by mouth daily., Disp: 1 packet, Rfl: 0 Social History   Socioeconomic History   Marital status: Married    Spouse name: Janelle   Number of children: 3   Years of education: master's   Highest education level: Master's degree (e.g.,  MA, MS, MEng, MEd, MSW, MBA)  Occupational History   Occupation: Press photographer    Comment: retired  Tobacco Use   Smoking status: Never   Smokeless tobacco: Never  Vaping Use   Vaping status: Never Used  Substance and Sexual Activity   Alcohol use: No   Drug use: No   Sexual activity: Not Currently  Other Topics Concern   Not on file  Social History Narrative   Patient drinks 1 cup of coffee daily.   Patient is right handed.    Patient resides locally with his wife.  He has 3 sons, 2 sons reside in [place] where the other one resides in [place] .  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   Social Drivers of Corporate investment banker Strain: Low Risk  (08/20/2023)   Overall Financial Resource Strain (CARDIA)    Difficulty of Paying Living Expenses: Not very hard  Food Insecurity: No Food Insecurity (08/20/2023)   Hunger Vital Sign    Worried About Running Out of Food in the Last Year: Never true    Ran Out of Food in the Last Year: Never true  Transportation Needs: No Transportation Needs (08/20/2023)   PRAPARE - Administrator, Civil Service (Medical): No    Lack of Transportation (Non-Medical): No  Physical Activity: Sufficiently Active (08/20/2023)   Exercise Vital Sign    Days of Exercise per Week: 7 days    Minutes of Exercise per Session: 30 min  Stress: No Stress Concern Present (08/20/2023)   [employer] of Occupational Health - Occupational Stress Questionnaire    Feeling of Stress : Not at all  Social Connections: Socially Integrated (08/20/2023)   Social Connection and Isolation Panel    Frequency of Communication with Friends and Family: More than three times a week    Frequency of Social Gatherings with Friends and Family: Once a week    Attends Religious Services: More than 4 times per year    Active  Member of [employer] or Organizations: Yes    Attends Banker Meetings: More than 4 times per year    Marital Status: Married  Catering manager Violence: Not At Risk (08/21/2023)   Humiliation, Afraid, Rape, and Kick questionnaire    Fear of Current or  Ex-Partner: No    Emotionally Abused: No    Physically Abused: No    Sexually Abused: No   Family History  Problem Relation Age of Onset   Breast cancer Mother    Stroke Mother    Hypertension Mother    Lung cancer Father        smoked   Ulcers Father    Heart disease Brother 4       stent   Esophageal cancer Brother    Cancer Paternal Uncle        prostate   Cancer Paternal Grandfather        prostate   Heart attack Neg Hx    Colon cancer Neg Hx    Colon polyps Neg Hx    Rectal cancer Neg Hx    Stomach cancer Neg Hx     Objective: Office vital signs reviewed. BP (!) 141/74   Pulse 93   Temp (!) 97.3 F (36.3 C)   Ht 5' 7 (1.702 m)   Wt 213 lb (96.6 kg)   SpO2 99%   BMI 33.36 kg/m   Physical Examination:  General: Awake, alert, nontoxic male, No acute distress Extremities: venous stasis bilaterally with associated dermatitis/ hyperpigmentation. Right lateral leg with half dollar sized ulcer, there is a dime sized ulcer just above and to the right of this lesion. See photos       Assessment/ Plan: 83 y.o. 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 Discussed healing challenges and potential specialist referral.  Dressed in xeroform/ telfa bandage and coban. Supplies provided to patient to use for next 7 days. - Prescribed Doxycycline  for potential infection. - Instructed on wound care techniques. - Scheduled follow-up in ten days for wound recheck. -  Consider referral to vascular specialist if no improvement.       Norene CHRISTELLA Fielding, DO [facility] [phone]

## 2024-04-18 NOTE — Patient Instructions (Addendum)
 Wash the wound with soap and water  daily Then cut out a square of the xeroform (yellow gauze) big enough to cover the wounds. Then apply 1 telfa pad Then wrap with the self stick wrap Change bandage daily If not getting smaller by our next week visit, we will refer to the wound care center Doxycycline  sent for bacterial coverage. Take 1 tablet twice per day.  Venous Ulcer A venous ulcer is a shallow sore on your lower leg. Venous ulcer is the most common type of lower leg ulcer. You may have venous ulcers on one leg or on both legs. This condition most often develops around your ankles. This type of ulcer may last for a long time (chronic ulcer) or it may return often (recurrent ulcer). What are the causes? This condition is caused by poor blood flow in your legs. The poor flow causes blood to pool in your legs. This can break the skin, causing an ulcer. What increases the risk? You are more likely to develop this condition if: You are male. You are 83 years of age or older. You have had a leg ulcer in the past. You have varicose veins. You have clots in your lower leg veins (deep vein thrombosis). You have irritation and swelling (inflammation) of your leg veins (phlebitis). You have recently been pregnant. You have a family history of chronic venous insufficiency. This is a condition where the leg veins do not pump enough blood from the legs to the heart. Your risk may be higher if: You are not active. You are overweight. You smoke. What are the signs or symptoms? The main symptom of this condition is an open sore near your ankle. Other symptoms may include: Swelling. Fluid coming from the ulcer. Bleeding. Itching. Pain and swelling. This gets worse when you stand up and feels better when you raise your leg. Changes in the skin, such as: Thick skin. Blotchy skin. Dark skin. How is this treated? Treatments include: Keeping your leg raised (elevated). Wearing a type of  bandage or stocking to keep pressure (compression) on the veins of your leg. Taking medicines, including antibiotic medicines. Cleaning your ulcer and removing any dead tissue from the wound. Using bandages and wraps that have medicines in them to cover your ulcer. Closing the wound using a piece of skin taken from another area of your body (graft). Healing may take a long time. You may need to see a foot doctor or a vein specialist. Follow these instructions at home: Medicines Take or apply over-the-counter and prescription medicines only as told by your doctor. If you were prescribed antibiotics, take them as told by your doctor. Do not stop taking them even if you start to feel better. Ask your doctor if you should take aspirin  before long trips. Wound care Follow instructions from your doctor about how to take care of your wound. Make sure you: Wash your hands with soap and water  for at least 20 seconds before and after you change your bandage. If you cannot use soap and water , use hand sanitizer. Change your bandage. Leave stitches or skin glue in place for at least 2 weeks. Leave tape strips alone unless you are told to take them off. You may trim the edges of the tape strips if they curl up. Ask when you should remove your bandage. If your bandage is dry and sticks to your leg when you try to remove it, moisten or wet the bandage with saline solution or water  alone to  make it easier to remove. Check your wound every day for signs of infection. Have a caregiver do this for you if you are not able to do it yourself. Check for: More redness, swelling, or pain. More fluid or blood. Warmth. Pus or a bad smell. Activity Get up to take short walks every 1 to 2 hours. Ask for help if you feel weak or unsteady. Rest with your legs raised during the day. If you can, keep your legs above the level of your heart for 30 minutes, 3-4 times a day, or as told by your doctor. Do not sit with your  legs crossed. Return to your normal activities when your doctor says that it is safe. General instructions  Wear elastic stockings, compression stockings, or support hose as told by your doctor. Raise the foot of your bed as told by your doctor. Do not smoke or use any products that contain nicotine or tobacco. If you need help quitting, ask your doctor. Try to eat a heart healthy and low salt diet. Keep a healthy body weight. Keep all follow-up visits. Your doctor will check if your ulcer is healing and change treatments if needed. Contact a doctor if: Your ulcer is getting larger or is not healing. Your pain gets worse. You have any signs of infection. You have a fever. This information is not intended to replace advice given to you by your health care provider. Make sure you discuss any questions you have with your health care provider. Document Revised: 03/21/2022 Document Reviewed: 03/21/2022 Elsevier Patient Education  2024 [employer].

## 2024-04-19 ENCOUNTER — Encounter (HOSPITAL_COMMUNITY)
Admission: RE | Disposition: A | Discharge status: Home / Self Care | Payer: Self-pay | Source: Home / Self Care | Attending: Ophthalmology

## 2024-04-19 ENCOUNTER — Encounter (HOSPITAL_COMMUNITY): Admitting: Anesthesiology

## 2024-04-19 ENCOUNTER — Ambulatory Visit (HOSPITAL_COMMUNITY): Admitting: Anesthesiology

## 2024-04-19 ENCOUNTER — Ambulatory Visit (HOSPITAL_COMMUNITY)
Admission: RE | Admit: 2024-04-19 | Discharge: 2024-04-19 | Disposition: A | Discharge status: Home / Self Care | Attending: Ophthalmology | Admitting: Ophthalmology

## 2024-04-19 ENCOUNTER — Encounter (HOSPITAL_COMMUNITY): Payer: Self-pay | Admitting: Ophthalmology

## 2024-04-19 ENCOUNTER — Other Ambulatory Visit: Payer: Self-pay

## 2024-04-19 DIAGNOSIS — H5711 Ocular pain, right eye: Secondary | ICD-10-CM | POA: Diagnosis not present

## 2024-04-19 DIAGNOSIS — E1159 Type 2 diabetes mellitus with other circulatory complications: Secondary | ICD-10-CM

## 2024-04-19 DIAGNOSIS — E1151 Type 2 diabetes mellitus with diabetic peripheral angiopathy without gangrene: Secondary | ICD-10-CM | POA: Insufficient documentation

## 2024-04-19 DIAGNOSIS — M1990 Unspecified osteoarthritis, unspecified site: Secondary | ICD-10-CM | POA: Insufficient documentation

## 2024-04-19 DIAGNOSIS — H25811 Combined forms of age-related cataract, right eye: Secondary | ICD-10-CM | POA: Insufficient documentation

## 2024-04-19 DIAGNOSIS — I129 Hypertensive chronic kidney disease with stage 1 through stage 4 chronic kidney disease, or unspecified chronic kidney disease: Secondary | ICD-10-CM

## 2024-04-19 DIAGNOSIS — N182 Chronic kidney disease, stage 2 (mild): Secondary | ICD-10-CM | POA: Insufficient documentation

## 2024-04-19 DIAGNOSIS — E1122 Type 2 diabetes mellitus with diabetic chronic kidney disease: Secondary | ICD-10-CM

## 2024-04-19 DIAGNOSIS — E1136 Type 2 diabetes mellitus with diabetic cataract: Secondary | ICD-10-CM | POA: Insufficient documentation

## 2024-04-19 DIAGNOSIS — Z8546 Personal history of malignant neoplasm of prostate: Secondary | ICD-10-CM | POA: Insufficient documentation

## 2024-04-19 HISTORY — PX: CATARACT EXTRACTION W/PHACO: SHX586

## 2024-04-19 HISTORY — PX: INSERTION, STENT, DRUG-ELUTING, LACRIMAL CANALICULUS: SHX7453

## 2024-04-19 LAB — GLUCOSE, CAPILLARY: Glucose-Capillary: 115 mg/dL — ABNORMAL HIGH (ref 70–99)

## 2024-04-19 SURGERY — PHACOEMULSIFICATION, CATARACT, WITH IOL INSERTION
Anesthesia: Monitor Anesthesia Care | Site: Eye | Laterality: Right

## 2024-04-19 MED ORDER — PHENYLEPHRINE-KETOROLAC 1-0.3 % IO SOLN
INTRAOCULAR | Status: DC | PRN
  Administered 2024-04-19: 500 mL via OPHTHALMIC

## 2024-04-19 MED ORDER — SODIUM HYALURONATE 23MG/ML IO SOSY
PREFILLED_SYRINGE | INTRAOCULAR | Status: DC | PRN
  Administered 2024-04-19: .6 mL via INTRAOCULAR

## 2024-04-19 MED ORDER — TROPICAMIDE 1 % OP SOLN
1.0000 [drp] | OPHTHALMIC | Status: AC | PRN
  Administered 2024-04-19 (×3): 1 [drp] via OPHTHALMIC

## 2024-04-19 MED ORDER — LACTATED RINGERS IV SOLN: INTRAVENOUS | Status: DC

## 2024-04-19 MED ORDER — DEXAMETHASONE 0.4 MG OP INST
VAGINAL_INSERT | OPHTHALMIC | Status: DC | PRN
  Administered 2024-04-19: .4 mg via OPHTHALMIC

## 2024-04-19 MED ORDER — PHENYLEPHRINE HCL 2.5 % OP SOLN
1.0000 [drp] | OPHTHALMIC | Status: AC | PRN
  Administered 2024-04-19 (×3): 1 [drp] via OPHTHALMIC

## 2024-04-19 MED ORDER — POVIDONE-IODINE 5 % OP SOLN
OPHTHALMIC | Status: DC | PRN
  Administered 2024-04-19: 1 via OPHTHALMIC

## 2024-04-19 MED ORDER — MOXIFLOXACIN HCL 5 MG/ML IO SOLN
INTRAOCULAR | Status: DC | PRN
  Administered 2024-04-19: .2 mL via INTRACAMERAL

## 2024-04-19 MED ORDER — LIDOCAINE HCL (PF) 1 % IJ SOLN
INTRAMUSCULAR | Status: DC | PRN
  Administered 2024-04-19: 1 mL

## 2024-04-19 MED ORDER — TETRACAINE HCL 0.5 % OP SOLN
1.0000 [drp] | OPHTHALMIC | Status: AC | PRN
  Administered 2024-04-19 (×3): 1 [drp] via OPHTHALMIC

## 2024-04-19 MED ORDER — BSS IO SOLN
INTRAOCULAR | Status: DC | PRN
  Administered 2024-04-19: 15 mL via INTRAOCULAR

## 2024-04-19 MED ORDER — LIDOCAINE HCL 3.5 % OP GEL
1.0000 | Freq: Once | OPHTHALMIC | Status: AC
  Administered 2024-04-19: 1 via OPHTHALMIC

## 2024-04-19 MED ORDER — DEXAMETHASONE 0.4 MG OP INST
VAGINAL_INSERT | OPHTHALMIC | Status: AC
  Filled 2024-04-19: qty 1

## 2024-04-19 MED ORDER — STERILE WATER FOR IRRIGATION IR SOLN
Status: DC | PRN
  Administered 2024-04-19: 1

## 2024-04-19 MED ORDER — SODIUM HYALURONATE 10 MG/ML IO SOLUTION
PREFILLED_SYRINGE | INTRAOCULAR | Status: DC | PRN
  Administered 2024-04-19: .85 mL via INTRAOCULAR

## 2024-04-19 SURGICAL SUPPLY — 11 items
CLOTH BEACON ORANGE TIMEOUT ST (SAFETY) ×2 IMPLANT
EYE SHIELD UNIVERSAL CLEAR (GAUZE/BANDAGES/DRESSINGS) IMPLANT
FEE CATARACT SUITE SIGHTPATH (MISCELLANEOUS) ×2 IMPLANT
GLOVE BIOGEL PI IND STRL 7.0 (GLOVE) ×4 IMPLANT
LENS IOL RAYONE EMV 19.5 (Intraocular Lens) IMPLANT
NDL HYPO 18GX1.5 BLUNT FILL (NEEDLE) ×2 IMPLANT
NEEDLE HYPO 18GX1.5 BLUNT FILL (NEEDLE) ×1 IMPLANT
PAD ARMBOARD POSITIONER FOAM (MISCELLANEOUS) ×2 IMPLANT
SYR TB 1ML LL NO SAFETY (SYRINGE) ×2 IMPLANT
TAPE SURG TRANSPORE 1 IN (GAUZE/BANDAGES/DRESSINGS) IMPLANT
WATER STERILE IRR 250ML POUR (IV SOLUTION) ×2 IMPLANT

## 2024-04-19 NOTE — Op Note (Signed)
 Date of procedure: 04/19/24  Pre-operative diagnosis:  Visually significant combined form age-related cataract, Right Eye (H25.811)  Post-operative diagnosis:   1. Visually significant combined form age-related cataract, Right Eye (H25.811) 2. Pain and inflammation following cataract surgery Right Eye (H57.11)  Procedure:  Removal of cataract via phacoemulsification and insertion of intra-ocular lens RAO200E +19.5D into the capsular bag of the Right Eye 2. Placement of Dextenza  insert, Right Eye  Attending surgeon: Lynwood LABOR. Lorren Splawn, MD, MA  Anesthesia: MAC, Topical Akten   Complications: None  Estimated Blood Loss: <40mL (minimal)  Specimens: None  Implants: As above  Indications:  Visually significant age-related cataract, Right Eye  Procedure:  The patient was seen and identified in the pre-operative area. The operative eye was identified and dilated.  The operative eye was marked.  Topical anesthesia was administered to the operative eye.     The patient was then to the operative suite and placed in the supine position.  A timeout was performed confirming the patient, procedure to be performed, and all other relevant information.   The patient's face was prepped and draped in the usual fashion for intra-ocular surgery.  A lid speculum was placed into the operative eye and the surgical microscope moved into place and focused.  A superotemporal paracentesis was created using a 20 gauge paracentesis blade. Omidria  was injected into the anterior chamber. Shugarcaine was injected into the anterior chamber.  Viscoelastic was injected into the anterior chamber.  A temporal clear-corneal main wound incision was created using a 2.72mm microkeratome.  A continuous curvilinear capsulorrhexis was initiated using an irrigating cystitome and completed using capsulorrhexis forceps.  Hydrodissection and hydrodeliniation were performed.  Viscoelastic was injected into the anterior chamber.  A  phacoemulsification handpiece and a chopper as a second instrument were used to remove the nucleus and epinucleus. The irrigation/aspiration handpiece was used to remove any remaining cortical material.   The capsular bag was reinflated with viscoelastic, checked, and found to be intact.  The intraocular lens was inserted into the capsular bag.  The irrigation/aspiration handpiece was used to remove any remaining viscoelastic.  The clear corneal wound and paracentesis wounds were then hydrated and checked with Weck-Cels to be watertight. 0.1mL of moxifloxacin  was injected into the anterior chamber.  The lid-speculum was removed. The lower punctum was dilated. A Dextenza  implant was placed in the lower canaliculus without complication.  The drape was removed.  The patient's face was cleaned with a wet and dry 4x4. A clear shield was taped over the eye. The patient was taken to the post-operative care unit in good condition, having tolerated the procedure well.  Post-Op Instructions: The patient will follow up at [facility] for a same day post-operative evaluation and will receive all other orders and instructions.

## 2024-04-19 NOTE — Discharge Instructions (Signed)
 Please discharge patient when stable, will follow up today with Dr. June Leap at the [facility] office immediately following discharge.  Leave shield in place until visit.  All paperwork with discharge instructions will be given at the office.  [facility] Address:  [address]  [place], [place] [address]

## 2024-04-19 NOTE — Anesthesia Procedure Notes (Signed)
 Date/Time: 04/19/2024 8:21 AM  Performed by: Para Jerelene CROME, CRNAOxygen Delivery Method: Nasal cannula

## 2024-04-19 NOTE — Transfer of Care (Addendum)
 Immediate Anesthesia Transfer of Care Note  Patient: Cottrell Gentles.  Procedure(s) Performed: PHACOEMULSIFICATION, CATARACT, WITH IOL INSERTION (Right: Eye) INSERTION, STENT, DRUG-ELUTING, LACRIMAL CANALICULUS (Right: Eye)  Patient Location: Short Stay  Anesthesia Type:MAC  Level of Consciousness: awake and patient cooperative  Airway & Oxygen Therapy: Patient Spontanous Breathing  Post-op Assessment: Report given to RN and Post -op Vital signs reviewed and stable  Post vital signs: Reviewed and stable  Last Vitals:  Vitals Value Taken Time  BP 131/73 04/19/24   0840  Temp 36.7 04/19/24   0840  Pulse 87 04/19/24   0840  Resp 16 04/19/24   0840  SpO2 100% 04/19/24   0840    Last Pain:  Vitals:   04/19/24 0720  TempSrc: Oral  PainSc: 0-No pain         Complications: No notable events documented.

## 2024-04-19 NOTE — Interval H&P Note (Signed)
 History and Physical Interval Note:  04/19/2024 8:14 AM  Ricardo Mayer.  has presented today for surgery, with the diagnosis of combined forms age related cataract, right eye.  The various methods of treatment have been discussed with the patient and family. After consideration of risks, benefits and other options for treatment, the patient has consented to  Procedure(s): PHACOEMULSIFICATION, CATARACT, WITH IOL INSERTION (Right) INSERTION, STENT, DRUG-ELUTING, LACRIMAL CANALICULUS (Right) as a surgical intervention.  The patient's history has been reviewed, patient examined, no change in status, stable for surgery.  I have reviewed the patient's chart and labs.  Questions were answered to the patient's satisfaction.     HARRIE AGENT

## 2024-04-19 NOTE — Anesthesia Postprocedure Evaluation (Signed)
 Anesthesia Post Note  Patient: Jontrell Bushong.  Procedure(s) Performed: PHACOEMULSIFICATION, CATARACT, WITH IOL INSERTION (Right: Eye) INSERTION, STENT, DRUG-ELUTING, LACRIMAL CANALICULUS (Right: Eye)  Patient location during evaluation: Phase II Anesthesia Type: MAC Level of consciousness: awake and alert Pain management: pain level controlled Vital Signs Assessment: post-procedure vital signs reviewed and stable Respiratory status: spontaneous breathing, nonlabored ventilation, respiratory function stable and patient connected to nasal cannula oxygen Cardiovascular status: stable and blood pressure returned to baseline Postop Assessment: no apparent nausea or vomiting Anesthetic complications: no   There were no known notable events for this encounter.   Last Vitals:  Vitals:   04/19/24 0720 04/19/24 0840  BP: (!) 145/85 131/73  Pulse: 100 87  Resp: 20 16  Temp: 36.7 C 36.7 C  SpO2:  100%    Last Pain:  Vitals:   04/19/24 0840  TempSrc: Oral  PainSc: 0-No pain                 Johnston Maddocks L Helma Argyle

## 2024-04-22 ENCOUNTER — Telehealth: Payer: Self-pay

## 2024-04-22 ENCOUNTER — Encounter (HOSPITAL_COMMUNITY): Payer: Self-pay | Admitting: Ophthalmology

## 2024-04-22 NOTE — Telephone Encounter (Signed)
 Copied from CRM [id number]. Topic: Clinical - Lab/Test Results >> Apr 22, 2024  9:58 AM Tiffany B wrote: Reason for RMF:Ejupzwu states Dr. Tobie his kidney doctor advised him to go to [facility] on 9/8 to have labs drawn for his kidney. Patient states he prefers to go to his PCP office to have labs done only. Advised patient, chart does not reflect lab orders and PCP would have to approve and place orders.

## 2024-04-22 NOTE — Telephone Encounter (Signed)
 If Dr Tobie has given him hard copy labs, they should be able to scan and collect here.  Otherwise, I have no idea what labs Dr Tobie wants.

## 2024-04-23 ENCOUNTER — Telehealth: Payer: Self-pay

## 2024-04-23 NOTE — Telephone Encounter (Signed)
 Copied from CRM #[id number]. Topic: Clinical - Lab/Test Results >> Apr 23, 2024  9:03 AM Carlatta H wrote: Reason for CRM: Patient does not have hard copy of lab orders on the phone number to [facility] [phone]

## 2024-04-23 NOTE — Telephone Encounter (Signed)
 Refer to other phone note .

## 2024-04-23 NOTE — Telephone Encounter (Signed)
 Attempted to contact - NA

## 2024-04-24 ENCOUNTER — Other Ambulatory Visit

## 2024-04-24 NOTE — Telephone Encounter (Signed)
 Received lab order from Dr Tobie electronically and [facility] has them. Patient came in this morning and had his lab work done.

## 2024-04-26 ENCOUNTER — Encounter: Payer: Self-pay | Admitting: Family

## 2024-04-26 ENCOUNTER — Ambulatory Visit (INDEPENDENT_AMBULATORY_CARE_PROVIDER_SITE_OTHER): Admitting: Family

## 2024-04-26 ENCOUNTER — Ambulatory Visit: Admitting: Family Medicine

## 2024-04-26 ENCOUNTER — Other Ambulatory Visit (HOSPITAL_COMMUNITY)

## 2024-04-26 VITALS — BP 150/80 | HR 91 | Temp 97.3°F | Ht 67.0 in | Wt 214.6 lb

## 2024-04-26 DIAGNOSIS — I872 Venous insufficiency (chronic) (peripheral): Secondary | ICD-10-CM

## 2024-04-26 DIAGNOSIS — L97212 Non-pressure chronic ulcer of right calf with fat layer exposed: Secondary | ICD-10-CM

## 2024-04-26 NOTE — Progress Notes (Signed)
 Subjective:    Patient ID: Ricardo Mayer., male    DOB: 1940/09/08, 83 y.o.   MRN: [id number]  Chief Complaint  Patient presents with   Wound Check   PT presents to the office today for wound follow up. He was seen on 04/18/24 and diagnosed with a venous stasis ulcer of right calf. He was given doxycyline. Denies any pain.   He is still taking the doxycline and lasix  30 mg daily.  Wound Check He was originally treated more than 14 days ago. Previous treatment included oral antibiotics. His temperature was unmeasured prior to arrival. There has been clear discharge from the wound. The redness has not changed. The swelling has not changed. There is no pain present.      Review of Systems  All other systems reviewed and are negative.   Social History   Socioeconomic History   Marital status: Married    Spouse name: Janelle   Number of children: 3   Years of education: master's   Highest education level: Master's degree (e.g., MA, MS, MEng, MEd, MSW, MBA)  Occupational History   Occupation: Press photographer    Comment: retired  Tobacco Use   Smoking status: Never   Smokeless tobacco: Never  Vaping Use   Vaping status: Never Used  Substance and Sexual Activity   Alcohol use: No   Drug use: No   Sexual activity: Not Currently  Other Topics Concern   Not on file  Social History Narrative   Patient drinks 1 cup of coffee daily.   Patient is right handed.    Patient resides locally with his wife.  He has 3 sons, 2 sons reside in [place] where the other one resides in [place] .  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   Social Drivers of Corporate investment banker Strain: Low Risk  (08/20/2023)   Overall Financial Resource Strain (CARDIA)    Difficulty of Paying Living Expenses: Not very hard  Food Insecurity: No Food  Insecurity (08/20/2023)   Hunger Vital Sign    Worried About Running Out of Food in the Last Year: Never true    Ran Out of Food in the Last Year: Never true  Transportation Needs: No Transportation Needs (08/20/2023)   PRAPARE - Administrator, Civil Service (Medical): No    Lack of Transportation (Non-Medical): No  Physical Activity: Sufficiently Active (08/20/2023)   Exercise Vital Sign    Days of Exercise per Week: 7 days    Minutes of Exercise per Session: 30 min  Stress: No Stress Concern Present (08/20/2023)   [employer] of Occupational Health - Occupational Stress Questionnaire    Feeling of Stress : Not at all  Social Connections: Socially Integrated (08/20/2023)   Social Connection and Isolation Panel    Frequency of Communication with Friends and Family: More than three times a week    Frequency of Social Gatherings with Friends and Family: Once a week    Attends Religious Services: More than 4 times per year    Active Member of [employer] or Organizations: Yes    Attends Engineer, structural: More than 4 times per year    Marital Status: Married   Family History  Problem Relation Age of Onset   Breast cancer Mother    Stroke Mother  Hypertension Mother    Lung cancer Father        smoked   Ulcers Father    Heart disease Brother 51       stent   Esophageal cancer Brother    Cancer Paternal Uncle        prostate   Cancer Paternal Grandfather        prostate   Heart attack Neg Hx    Colon cancer Neg Hx    Colon polyps Neg Hx    Rectal cancer Neg Hx    Stomach cancer Neg Hx         Objective:   Physical Exam Vitals reviewed.  Constitutional:      General: He is not in acute distress.    Appearance: He is well-developed. He is obese.  HENT:     Head: Normocephalic.  Eyes:     General:        Right eye: No discharge.        Left eye: No discharge.     Pupils: Pupils are equal, round, and reactive to light.  Neck:     Thyroid : No  thyromegaly.  Cardiovascular:     Rate and Rhythm: Normal rate and regular rhythm.     Heart sounds: Normal heart sounds. No murmur heard. Pulmonary:     Effort: Pulmonary effort is normal. No respiratory distress.     Breath sounds: Normal breath sounds. No wheezing.  Abdominal:     General: Bowel sounds are normal. There is no distension.     Palpations: Abdomen is soft.     Tenderness: There is no abdominal tenderness.  Musculoskeletal:        General: No tenderness. Normal range of motion.     Cervical back: Normal range of motion and neck supple.  Skin:    General: Skin is warm and dry.     Findings: Abrasion present. No erythema or rash.         Comments: Ulcer of right lateral calf, 4.5X3.5 and 2X1.5  Neurological:     Mental Status: He is alert and oriented to person, place, and time.     Cranial Nerves: No cranial nerve deficit.     Deep Tendon Reflexes: Reflexes are normal and symmetric.  Psychiatric:        Behavior: Behavior normal.        Thought Content: Thought content normal.        Judgment: Judgment normal.      Vaseline guaze applied to ulcer then Unna boot applied to right leg BP (!) 150/80   Pulse 91   Temp (!) 97.3 F (36.3 C)   Ht 5' 7 (1.702 m)   Wt 214 lb 9.6 oz (97.3 kg)   SpO2 100%   BMI 33.61 kg/m      Assessment & Plan:  Ricardo Mayer. comes in today with chief complaint of Wound Check   Diagnosis and orders addressed:  1. 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 Venous insufficiency  Continue doxycycline  and lasix   Unna boots applied Pt refuses home health today, wants to do dressing changes at home Keep legs elevated Follow up with PCP in week   Bari Learn, FNP

## 2024-04-26 NOTE — Patient Instructions (Signed)
Venous Ulcer A venous ulcer is an irregular, shallow sore on your lower leg caused by poor circulation in your veins. Venous ulcer is the most common type of lower leg ulcer. You may have venous ulcers on one leg or on both legs. The area where this condition most often develops is around the ankles. A venous ulcer may last for a long time (chronic ulcer) or it may return repeatedly (recurrent ulcer). What are the causes? A venous ulcer may be caused by any condition that causes poor blood flow from your legs back to your heart. Veins have valves that help return blood to the heart. If these valves do not work properly: Blood can flow backward and pool in the lower legs. Blood can then leak out of your veins, which can irritate your skin or cause the skin to get darker over the calf/shin. Irritation or trauma can cause a break in the skin, which becomes a venous ulcer. What increases the risk? You are more likely to develop this condition if you: Are male. Are 83 years of age or older. Have had a leg ulcer in the past. Have varicose veins. Have clots in your lower leg veins (deep vein thrombosis). Have inflammation of your leg veins (phlebitis). Have had a recent pregnancy or history of multiple pregnancies. A family history of chronic venous insufficiency. Your risk may be higher if you are: Not active. Overweight. Using products that contain nicotine or tobacco. What are the signs or symptoms? The main symptom of this condition is an open sore near your ankle. Other symptoms may include: Swelling. Fluid leaking from the ulcer. Bleeding. Itching. A foul odor. Pain and swelling that gets worse when you stand up and feels better when you raise your leg. Changes in the skin, such as: The skin getting thicker. Blotchy skin. Darker skin over the calf. How is this diagnosed? Your health care provider will do a physical exam and ask about your medical history. Other tests may be done,  such as: Measuring blood pressure in your arms and legs. An ultrasound to measure blood flow in your leg veins. A CT scan to identify damaged valves. How is this treated? This condition may be treated by: Keeping your leg raised (elevated). Wearing a type of bandage (dressing) or stocking to compress the veins of your leg (compression therapy). Taking medicines to improve blood flow. Taking antibiotic medicines to treat infection. Cleaning your ulcer and removing any dead tissue from the wound (debridement). Placing various types of medicated dressings or wraps on your ulcer. Surgery to close the wound using a piece of skin taken from another area of your body (graft). This is only done for wounds that are deep or hard to heal. You may need to try different types of treatment to get your venous ulcer to heal. Healing may take a long time. You may need to see a specialist, such as a podiatrist or vascular specialist, to get testing and treatment. Follow these instructions at home: Medicines Take or apply over-the-counter and prescription medicines only as told by your provider. If you were prescribed antibiotics, take them as told by your provider. Do not stop taking your antibiotics even if you start to feel better. Ask your provider if you should take aspirin before long trips. Wound care Follow instructions from your provider about how to take care of your wound. Make sure you: Wash your hands with soap and water for at least 20 seconds before and after you change  your dressing. If soap and water are not available, use hand sanitizer. Change your dressing as told by your provider. If you had a skin graft, leave stitches (sutures) in place. These may need to stay in place for 2 weeks or longer. Ask when you should remove your dressing. If your dressing is dry and sticks to your leg when you try to remove it, moisten or wet the dressing with saline solution (salt and water) or water alone.  This is so the dressing can be removed without harming your skin or wound tissue. Check your wound every day for signs of infection. Have a caregiver do this if you are not able to do it yourself. Check for: More redness, swelling, or pain. More fluid or blood. Warmth. Pus or a bad smell. Activity Do not sit for a long time without moving. Get up to take short walks every 1-2 hours. This will improve blood flow and breathing. Ask for help if you feel weak or unsteady. Rest with your legs raised during the day. If possible, raise your legs above the level of your heart for 30 minutes, 3-4 times a day, or as told by your provider. Do not sit with your legs crossed. Return to your normal activities as told by your provider. Ask your provider what activities are safe for you. General instructions  Wear elastic stockings, compression stockings, or support hose as told by your provider. Raise the foot of your bed as told. Do not use any products that contain nicotine or tobacco. These products include cigarettes, chewing tobacco, and vaping devices, such as e-cigarettes. If you need help quitting, ask your provider. Try to eat a heart healthy and lower salt (sodium) diet. Maintain a healthy body weight. Keep all follow-up visits. Your provider will check if your ulcer is healing and change treatments if needed. Contact a health care provider if: Your ulcer is getting larger or is not healing. Your pain gets worse. You have any signs of infection. You have a fever. This information is not intended to replace advice given to you by your health care provider. Make sure you discuss any questions you have with your health care provider. Document Revised: 03/21/2022 Document Reviewed: 03/21/2022 Elsevier Patient Education  2024 [employer].

## 2024-04-29 DIAGNOSIS — D631 Anemia in chronic kidney disease: Secondary | ICD-10-CM | POA: Diagnosis not present

## 2024-04-29 DIAGNOSIS — I120 Hypertensive chronic kidney disease with stage 5 chronic kidney disease or end stage renal disease: Secondary | ICD-10-CM | POA: Diagnosis not present

## 2024-04-29 DIAGNOSIS — N2581 Secondary hyperparathyroidism of renal origin: Secondary | ICD-10-CM | POA: Diagnosis not present

## 2024-04-29 DIAGNOSIS — N185 Chronic kidney disease, stage 5: Secondary | ICD-10-CM | POA: Diagnosis not present

## 2024-05-03 ENCOUNTER — Encounter: Payer: Self-pay | Admitting: Family Medicine

## 2024-05-03 ENCOUNTER — Ambulatory Visit (INDEPENDENT_AMBULATORY_CARE_PROVIDER_SITE_OTHER): Admitting: Family Medicine

## 2024-05-03 VITALS — BP 148/76 | HR 89 | Temp 97.6°F | Ht 67.0 in | Wt 212.1 lb

## 2024-05-03 DIAGNOSIS — I872 Venous insufficiency (chronic) (peripheral): Secondary | ICD-10-CM | POA: Diagnosis not present

## 2024-05-03 DIAGNOSIS — L97212 Non-pressure chronic ulcer of right calf with fat layer exposed: Secondary | ICD-10-CM | POA: Diagnosis not present

## 2024-05-03 DIAGNOSIS — Z23 Encounter for immunization: Secondary | ICD-10-CM | POA: Diagnosis not present

## 2024-05-03 NOTE — Progress Notes (Signed)
 Subjective: CC: Wound check PCP: Ricardo Norene HERO, DO YEP:[name]. is a 83 y.o. male presenting to clinic today for: History of Present Illness   Patient here for follow-up venous stasis ulcer on the right lower extremity that was initially identified on 04/18/2024.  He was seen for wound recheck on the 12th of the month and notes that he has completed his doxycycline .  He continues with Xeroform bandage changes, washing with soap and water .  He denies any pain or fevers.  He cannot visualize the wound well but his wife has been keeping an eye on it   ROS: Per HPI  No Known Allergies Past Medical History:  Diagnosis Date   Adenomatous polyp of colon    Arthritis    bilateral knees   Atypical mole 2016   left shoulder sever wider shave   BCC (basal cell carcinoma of skin) 2011   left neck tx mohs   BCC (basal cell carcinoma of skin) 2015   left sideburn tx with bx   BCC (basal cell carcinoma of skin) 2015   right post shoulder tx with bx   BCC (basal cell carcinoma of skin) 2016   left chest tx with bx   BCC (basal cell carcinoma of skin) 2017   left cheek cx3 [id number]   Cataract    CKD (chronic kidney disease), stage II    Dr. 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 Georgina.PCP   History of basal cell cancer 2011   right inner eye tx mohs   History of DVT of lower extremity    2011--  LEFT LEG   History of kidney stones    History of melanoma excision    RIGHT ARM   Hyperlipidemia    Hypertension    Impaired hearing    hearing aids bilateral   Macular degeneration    Melanoma (HCC) 1990   arm and back per patient  -Melanoma   Near syncope 08/25/2015   Dr. Ida oncefound blood clots in lung   Nocturia    Prostate cancer [facility]) 2014   Dr. Watt manuel- seed implant '14   Ulcer of left lower leg (HCC)    recent pcp visit dr georgina--   ordered betadine  wet/dry dsg daily (03-18-2013)  CONSULT WITH WOUND CARE CENTER IN 1 WEEK    Current Outpatient Medications:    amLODipine  (NORVASC ) 5 MG tablet, TAKE 1 TABLET BY MOUTH DAILY, Disp: 100 tablet, Rfl: 1   aspirin  325 MG tablet, Take 1 tablet (325 mg total) by mouth daily., Disp: 90 tablet, Rfl: 3   atorvastatin  (LIPITOR) 10 MG tablet, TAKE 1 TABLET BY MOUTH DAILY, Disp: 100 tablet, Rfl: 1   azelastine  (ASTELIN ) 0.1 % nasal spray, Place 1 spray into both nostrils 2 (two) times daily. For runny nose, Disp: 30 mL, Rfl: 12   benzonatate  (TESSALON  PERLES) 100 MG capsule, Take 1 capsule (100 mg total) by mouth 3 (three) times daily as needed for cough., Disp: 20 capsule, Rfl: 0   cholecalciferol (VITAMIN D3) 25 MCG (1000 UT) tablet, Take 1,000 Units by mouth daily., Disp: , Rfl:    doxazosin (CARDURA) 1 MG tablet, Take 1 mg by mouth at bedtime., Disp: , Rfl:    furosemide  (LASIX ) 20 MG tablet, TAKE 1 AND 1/2 TABLETS BY MOUTH  DAILY AS NEEDED FOR SWELLING, Disp: 150  tablet, Rfl: 1   loratadine  (CLARITIN ) 10 MG tablet, Take 1 tablet EVERY OTHER day.  RENAL DOSING!!! Please reiterate this., Disp: 30 tablet, Rfl: 11   olmesartan  (BENICAR ) 40 MG tablet, TAKE 1 TABLET BY MOUTH DAILY, Disp: 100 tablet, Rfl: 1   sodium zirconium cyclosilicate  (LOKELMA ) 10 g PACK packet, Take 10 g by mouth daily., Disp: 1 packet, Rfl: 0 Social History   Socioeconomic History   Marital status: Married    Spouse name: Janelle   Number of children: 3   Years of education: master's   Highest education level: Master's degree (e.g., MA, MS, MEng, MEd, MSW, MBA)  Occupational History   Occupation: Press photographer    Comment: retired  Tobacco Use   Smoking status: Never   Smokeless tobacco: Never  Vaping Use   Vaping status: Never Used  Substance and Sexual Activity   Alcohol use: No   Drug use: No   Sexual activity: Not Currently  Other Topics Concern   Not on file  Social History Narrative   Patient  drinks 1 cup of coffee daily.   Patient is right handed.    Patient resides locally with his wife.  He has 3 sons, 2 sons reside in [place] where the other one resides in [place] .  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   Social Drivers of Corporate investment banker Strain: Low Risk  (08/20/2023)   Overall Financial Resource Strain (CARDIA)    Difficulty of Paying Living Expenses: Not very hard  Food Insecurity: No Food Insecurity (08/20/2023)   Hunger Vital Sign    Worried About Running Out of Food in the Last Year: Never true    Ran Out of Food in the Last Year: Never true  Transportation Needs: No Transportation Needs (08/20/2023)   PRAPARE - Administrator, Civil Service (Medical): No    Lack of Transportation (Non-Medical): No  Physical Activity: Sufficiently Active (08/20/2023)   Exercise Vital Sign    Days of Exercise per Week: 7 days    Minutes of Exercise per Session: 30 min  Stress: No Stress Concern Present (08/20/2023)   [employer] of Occupational Health - Occupational Stress Questionnaire    Feeling of Stress : Not at all  Social Connections: Socially Integrated (08/20/2023)   Social Connection and Isolation Panel    Frequency of Communication with Friends and Family: More than three times a week    Frequency of Social Gatherings with Friends and Family: Once a week    Attends Religious Services: More than 4 times per year    Active Member of [employer] or Organizations: Yes    Attends Engineer, structural: More than 4 times per year    Marital Status: Married  Catering manager Violence: Not At Risk (08/21/2023)   Humiliation, Afraid, Rape, and Kick questionnaire    Fear of Current or Ex-Partner: No    Emotionally Abused: No    Physically Abused: No    Sexually Abused: No   Family History  Problem Relation  Age of Onset   Breast cancer Mother    Stroke Mother    Hypertension Mother    Lung cancer Father        smoked   Ulcers Father    Heart disease Brother 72  stent   Esophageal cancer Brother    Cancer Paternal Uncle        prostate   Cancer Paternal Grandfather        prostate   Heart attack Neg Hx    Colon cancer Neg Hx    Colon polyps Neg Hx    Rectal cancer Neg Hx    Stomach cancer Neg Hx     Objective: Office vital signs reviewed. BP (!) 151/79   Pulse 89   Temp 97.6 F (36.4 C)   Ht 5' 7 (1.702 m)   Wt 212 lb 2 oz (96.2 kg)   SpO2 100%   BMI 33.22 kg/m   Physical Examination:  General: Awake, alert, well nourished, No acute distress Skin: Right lateral lower extremity with persistent venous stasis ulcer that is unchanged in size and is still less than 4 cm in diameter but seems to be much shallower than it had been.  He has some serosanguineous weeping at the bottom of the lesion but no overt purulence, odors, warmth.  Venous stasis changes to bilateral lower extremities present  Assessment/ Plan: 83 y.o. 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  I am going to assess him again in the next 10 to 14 days and as long as he continues to heal we will avoid referral to wound care.  However, if wound care is needed will plan for home health wound care.  Influenza vaccination administered  Norene CHRISTELLA Fielding, DO [facility] [phone]

## 2024-05-07 ENCOUNTER — Other Ambulatory Visit: Payer: Self-pay | Admitting: Family Medicine

## 2024-05-15 ENCOUNTER — Ambulatory Visit: Admitting: Family Medicine

## 2024-05-15 ENCOUNTER — Encounter: Payer: Self-pay | Admitting: Family Medicine

## 2024-05-15 VITALS — BP 109/66 | HR 95 | Temp 97.1°F | Ht 67.0 in | Wt 210.4 lb

## 2024-05-15 DIAGNOSIS — I872 Venous insufficiency (chronic) (peripheral): Secondary | ICD-10-CM

## 2024-05-15 DIAGNOSIS — L97212 Non-pressure chronic ulcer of right calf with fat layer exposed: Secondary | ICD-10-CM

## 2024-05-15 NOTE — Progress Notes (Signed)
 Subjective: CC: Wound check PCP: [name], DO YEP:Mnwzb D Deke Tilghman. is a 83 y.o. male presenting to clinic today for:  Stasis ulcer Patient with stasis ulcer that was evaluated initially on 04/18/2024.  At that time it was measuring about 3-1/2 cm wide and just under 4 cm long.  He had a smaller lesion just superior to this that was ulcerated as well.  He was started on Xeroform bandages and empirically treated with doxycycline  He was seen on 9/19 for interval wound check and reported no concerning drainage or pain.  The wound was more shallow during that visit and seem to be contracting.  No evidence of secondary bacterial infection at that visit.  He has continued with Xeroform bandages and daily washes with soap and water  and reports no concerns.  Still cannot well-visualized but has been utilizing a mirror to look at it daily and feels like it is getting a little smaller.   ROS: Per HPI  No Known Allergies Past Medical History:  Diagnosis Date   Adenomatous polyp of colon    Arthritis    bilateral knees   Atypical mole 2016   left shoulder sever wider shave   BCC (basal cell carcinoma of skin) 2011   left neck tx mohs   BCC (basal cell carcinoma of skin) 2015   left sideburn tx with bx   BCC (basal cell carcinoma of skin) 2015   right post shoulder tx with bx   BCC (basal cell carcinoma of skin) 2016   left chest tx with bx   BCC (basal cell carcinoma of skin) 2017   left cheek cx3 32fu   Cataract    CKD (chronic kidney disease), stage II    Dr. 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 Georgina.PCP   History of basal cell cancer 2011   right inner eye tx mohs   History of DVT of lower extremity    2011--  LEFT LEG   History of kidney stones    History of melanoma excision    RIGHT ARM   Hyperlipidemia    Hypertension    Impaired hearing    hearing aids  bilateral   Macular degeneration    Melanoma (HCC) 1990   arm and back per patient  -Melanoma   Near syncope 08/25/2015   Dr. Ida oncefound blood clots in lung   Nocturia    Prostate cancer [facility]) 2014   Dr. Watt manuel- seed implant '14   Ulcer of left lower leg (HCC)    recent pcp visit dr georgina--  ordered betadine  wet/dry dsg daily (03-18-2013)  CONSULT WITH WOUND CARE CENTER IN 1 WEEK    Current Outpatient Medications:    amLODipine  (NORVASC ) 5 MG tablet, TAKE 1 TABLET BY MOUTH DAILY, Disp: 100 tablet, Rfl: 1   aspirin  325 MG tablet, Take 1 tablet (325 mg total) by mouth daily., Disp: 90 tablet, Rfl: 3   atorvastatin  (LIPITOR) 10 MG tablet, TAKE 1 TABLET BY MOUTH DAILY, Disp: 100 tablet, Rfl: 1   azelastine  (ASTELIN ) 0.1 % nasal spray, Place 1 spray into both nostrils 2 (two) times daily. For runny nose, Disp: 30 mL, Rfl: 12   benzonatate  (TESSALON  PERLES) 100 MG capsule, Take 1 capsule (100 mg total) by mouth 3 (three) times daily as needed for cough., Disp: 20 capsule, Rfl: 0   cholecalciferol (  VITAMIN D3) 25 MCG (1000 UT) tablet, Take 1,000 Units by mouth daily., Disp: , Rfl:    doxazosin (CARDURA) 1 MG tablet, Take 1 mg by mouth at bedtime., Disp: , Rfl:    furosemide  (LASIX ) 20 MG tablet, TAKE 1 AND 1/2 TABLETS BY MOUTH  DAILY AS NEEDED FOR SWELLING, Disp: 150 tablet, Rfl: 0   loratadine  (CLARITIN ) 10 MG tablet, Take 1 tablet EVERY OTHER day.  RENAL DOSING!!! Please reiterate this., Disp: 30 tablet, Rfl: 11   olmesartan  (BENICAR ) 40 MG tablet, TAKE 1 TABLET BY MOUTH DAILY, Disp: 100 tablet, Rfl: 1   sodium zirconium cyclosilicate  (LOKELMA ) 10 g PACK packet, Take 10 g by mouth daily., Disp: 1 packet, Rfl: 0 Social History   Socioeconomic History   Marital status: Married    Spouse name: Janelle   Number of children: 3   Years of education: master's   Highest education level: Master's degree (e.g., MA, MS, MEng, MEd, MSW, MBA)  Occupational History   Occupation:  Press photographer    Comment: retired  Tobacco Use   Smoking status: Never   Smokeless tobacco: Never  Vaping Use   Vaping status: Never Used  Substance and Sexual Activity   Alcohol use: No   Drug use: No   Sexual activity: Not Currently  Other Topics Concern   Not on file  Social History Narrative   Patient drinks 1 cup of coffee daily.   Patient is right handed.    Patient resides locally with his wife.  He has 3 sons, 2 sons reside in [place] where the other one resides in [facility] [place] .  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   Social Drivers of Corporate investment banker Strain: Low Risk  (08/20/2023)   Overall Financial Resource Strain (CARDIA)    Difficulty of Paying Living Expenses: Not very hard  Food Insecurity: No Food Insecurity (08/20/2023)   Hunger Vital Sign    Worried About Running Out of Food in the Last Year: Never true    Ran Out of Food in the Last Year: Never true  Transportation Needs: No Transportation Needs (08/20/2023)   PRAPARE - Administrator, Civil Service (Medical): No    Lack of Transportation (Non-Medical): No  Physical Activity: Sufficiently Active (08/20/2023)   Exercise Vital Sign    Days of Exercise per Week: 7 days    Minutes of Exercise per Session: 30 min  Stress: No Stress Concern Present (08/20/2023)   [employer] of Occupational Health - Occupational Stress Questionnaire    Feeling of Stress : Not at all  Social Connections: Socially Integrated (08/20/2023)   Social Connection and Isolation Panel    Frequency of Communication with Friends and Family: More than three times a week    Frequency of Social Gatherings with Friends and Family: Once a week    Attends Religious Services: More than 4 times per year    Active Member of [employer] or Organizations: Yes    Attends Tax inspector Meetings: More than 4 times per year    Marital Status: Married  Catering manager Violence: Not At Risk (08/21/2023)   Humiliation, Afraid, Rape, and Kick questionnaire    Fear of Current or Ex-Partner: No    Emotionally Abused: No    Physically Abused: No    Sexually Abused:  No   Family History  Problem Relation Age of Onset   Breast cancer Mother    Stroke Mother    Hypertension Mother    Lung cancer Father        smoked   Ulcers Father    Heart disease Brother 65       stent   Esophageal cancer Brother    Cancer Paternal Uncle        prostate   Cancer Paternal Grandfather        prostate   Heart attack Neg Hx    Colon cancer Neg Hx    Colon polyps Neg Hx    Rectal cancer Neg Hx    Stomach cancer Neg Hx     Objective: Office vital signs reviewed. BP 109/66   Pulse 95   Temp (!) 97.1 F (36.2 C)   Ht 5' 7 (1.702 m)   Wt 210 lb 6 oz (95.4 kg)   SpO2 99%   BMI 32.95 kg/m   Physical Examination:  General: Awake, alert, well nourished, No acute distress Skin: Ulcer just superior to the main ulcer has almost closed entirely.  The larger ulcer has filled in pretty well and is approximately 2.75 cm in width and 3.6 cm in length.  No drainage or bleeding.  No odors.  No warmth or erythema    Assessment/ Plan: 83 y.o. 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  It is contracted slightly as well.  At this time I think we can continue with current care.  No evidence of secondary bacterial infection.  Will see him back again in the next 2-1/2 to 3 weeks for full physical and wound recheck.  Supplies were provided today.   Norene CHRISTELLA Fielding, DO [facility] [phone]

## 2024-05-20 ENCOUNTER — Ambulatory Visit: Admitting: Family Medicine

## 2024-05-24 ENCOUNTER — Other Ambulatory Visit: Payer: Self-pay | Admitting: *Deleted

## 2024-05-24 DIAGNOSIS — N186 End stage renal disease: Secondary | ICD-10-CM

## 2024-05-27 NOTE — Progress Notes (Unsigned)
 Patient name: Ricardo Mayer. MRN: [id number] DOB: [birth year] Sex: male  REASON FOR CONSULT: New access placement  HPI: Ricardo Mayer. is a 83 y.o. male, with history of CKD 5, diabetes, hypertension, hyperlipidemia presents for evaluation of new permanent hemodialysis access.  Past Medical History:  Diagnosis Date   Adenomatous polyp of colon    Arthritis    bilateral knees   Atypical mole 2016   left shoulder sever wider shave   BCC (basal cell carcinoma of skin) 2011   left neck tx mohs   BCC (basal cell carcinoma of skin) 2015   left sideburn tx with bx   BCC (basal cell carcinoma of skin) 2015   right post shoulder tx with bx   BCC (basal cell carcinoma of skin) 2016   left chest tx with bx   BCC (basal cell carcinoma of skin) 2017   left cheek cx3 102fu   Cataract    CKD (chronic kidney disease), stage II    Dr. 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 Georgina.PCP   History of basal cell cancer 2011   right inner eye tx mohs   History of DVT of lower extremity    2011--  LEFT LEG   History of kidney stones    History of melanoma excision    RIGHT ARM   Hyperlipidemia    Hypertension    Impaired hearing    hearing aids bilateral   Macular degeneration    Melanoma (HCC) 1990   arm and back per patient  -Melanoma   Near syncope 08/25/2015   Dr. Ida oncefound blood clots in lung   Nocturia    Prostate cancer [facility]) 2014   Dr. Watt manuel- seed implant '14   Ulcer of left lower leg (HCC)    recent pcp visit dr georgina--  ordered betadine  wet/dry dsg daily (03-18-2013)  CONSULT WITH WOUND CARE CENTER IN 1 WEEK    Past Surgical History:  Procedure Laterality Date   CATARACT EXTRACTION W/PHACO Left 10/25/2021   Procedure: CATARACT EXTRACTION PHACO AND INTRAOCULAR LENS PLACEMENT (IOC);  Surgeon: Harrie Agent, MD;  Location: [facility];  Service:  Ophthalmology;  Laterality: Left;  CDE: 24.87   CATARACT EXTRACTION W/PHACO Right 04/19/2024   Procedure: PHACOEMULSIFICATION, CATARACT, WITH IOL INSERTION;  Surgeon: Harrie Agent, MD;  Location: [facility];  Service: Ophthalmology;  Laterality: Right;  CDE: 11.49   COLONOSCOPY     EYE SURGERY     INSERTION, STENT, DRUG-ELUTING, LACRIMAL CANALICULUS Right 04/19/2024   Procedure: INSERTION, STENT, DRUG-ELUTING, LACRIMAL CANALICULUS;  Surgeon: Harrie Agent, MD;  Location: [facility];  Service: Ophthalmology;  Laterality: Right;   JOINT REPLACEMENT     KIDNEY STONE EXTRACTIONS  LAST ONE 1970   X2 OPEN/  X1  URETEROSCOPY   MELANOMA EXCISION RIGHT ARM  1990   POLYPECTOMY     PROSTATE BIOPSY     RADIOACTIVE SEED IMPLANT N/A 03/21/2013   Procedure: RADIOACTIVE SEED IMPLANT;  Surgeon: Norleen JINNY Watt, MD;  Location: [facility];  Service: Urology;  Laterality: N/A;  87 seeds implanted     TONSILLECTOMY     TOTAL KNEE ARTHROPLASTY Right 03/21/2016   Procedure: RIGHT TOTAL KNEE ARTHROPLASTY;  Surgeon: Donnice Car, MD;  Location: [facility];  Service: Orthopedics;  Laterality: Right;    Family History  Problem Relation Age  of Onset   Breast cancer Mother    Stroke Mother    Hypertension Mother    Lung cancer Father        smoked   Ulcers Father    Heart disease Brother 10       stent   Esophageal cancer Brother    Cancer Paternal Uncle        prostate   Cancer Paternal Grandfather        prostate   Heart attack Neg Hx    Colon cancer Neg Hx    Colon polyps Neg Hx    Rectal cancer Neg Hx    Stomach cancer Neg Hx     SOCIAL HISTORY: Social History   Socioeconomic History   Marital status: Married    Spouse name: Janelle   Number of children: 3   Years of education: master's   Highest education level: Master's degree (e.g., MA, MS, MEng, MEd, MSW, MBA)  Occupational History   Occupation: Press photographer    Comment: retired  Tobacco Use   Smoking status: Never   Smokeless  tobacco: Never  Vaping Use   Vaping status: Never Used  Substance and Sexual Activity   Alcohol use: No   Drug use: No   Sexual activity: Not Currently  Other Topics Concern   Not on file  Social History Narrative   Patient drinks 1 cup of coffee daily.   Patient is right handed.    Patient resides locally with his wife.  He has 3 sons, 2 sons reside in [place] where the other one resides in [facility] [place] .  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   Social Drivers of Corporate investment banker Strain: Low Risk  (08/20/2023)   Overall Financial Resource Strain (CARDIA)    Difficulty of Paying Living Expenses: Not very hard  Food Insecurity: No Food Insecurity (08/20/2023)   Hunger Vital Sign    Worried About Running Out of Food in the Last Year: Never true    Ran Out of Food in the Last Year: Never true  Transportation Needs: No Transportation Needs (08/20/2023)   PRAPARE - Administrator, Civil Service (Medical): No    Lack of Transportation (Non-Medical): No  Physical Activity: Sufficiently Active (08/20/2023)   Exercise Vital Sign    Days of Exercise per Week: 7 days    Minutes of Exercise per Session: 30 min  Stress: No Stress Concern Present (08/20/2023)   [employer] of Occupational Health - Occupational Stress Questionnaire    Feeling of Stress : Not at all  Social Connections: Socially Integrated (08/20/2023)   Social Connection and Isolation Panel    Frequency of Communication with Friends and Family: More than three times a week    Frequency of Social Gatherings with Friends and Family: Once a week    Attends Religious Services: More than 4 times per year    Active Member of [employer] or Organizations: Yes    Attends Banker Meetings: More than 4 times per year    Marital Status: Married  Catering manager  Violence: Not At Risk (08/21/2023)   Humiliation, Afraid, Rape, and Kick questionnaire    Fear of Current or Ex-Partner: No    Emotionally Abused: No    Physically Abused: No    Sexually Abused: No  No Known Allergies  Current Outpatient Medications  Medication Sig Dispense Refill   amLODipine  (NORVASC ) 5 MG tablet TAKE 1 TABLET BY MOUTH DAILY 100 tablet 1   aspirin  325 MG tablet Take 1 tablet (325 mg total) by mouth daily. 90 tablet 3   atorvastatin  (LIPITOR) 10 MG tablet TAKE 1 TABLET BY MOUTH DAILY 100 tablet 1   azelastine  (ASTELIN ) 0.1 % nasal spray Place 1 spray into both nostrils 2 (two) times daily. For runny nose 30 mL 12   benzonatate  (TESSALON  PERLES) 100 MG capsule Take 1 capsule (100 mg total) by mouth 3 (three) times daily as needed for cough. 20 capsule 0   cholecalciferol (VITAMIN D3) 25 MCG (1000 UT) tablet Take 1,000 Units by mouth daily.     doxazosin (CARDURA) 1 MG tablet Take 1 mg by mouth at bedtime.     furosemide  (LASIX ) 20 MG tablet TAKE 1 AND 1/2 TABLETS BY MOUTH  DAILY AS NEEDED FOR SWELLING 150 tablet 0   loratadine  (CLARITIN ) 10 MG tablet Take 1 tablet EVERY OTHER day.  RENAL DOSING!!! Please reiterate this. 30 tablet 11   olmesartan  (BENICAR ) 40 MG tablet TAKE 1 TABLET BY MOUTH DAILY 100 tablet 1   sodium zirconium cyclosilicate  (LOKELMA ) 10 g PACK packet Take 10 g by mouth daily. 1 packet 0   No current facility-administered medications for this visit.    REVIEW OF SYSTEMS:  [X]  denotes positive finding, [ ]  denotes negative finding Cardiac  Comments:  Chest pain or chest pressure: ***   Shortness of breath upon exertion:    Short of breath when lying flat:    Irregular heart rhythm:        Vascular    Pain in calf, thigh, or hip brought on by ambulation:    Pain in feet at night that wakes you up from your sleep:     Blood clot in your veins:    Leg swelling:         Pulmonary    Oxygen at home:    Productive cough:     Wheezing:          Neurologic    Sudden weakness in arms or legs:     Sudden numbness in arms or legs:     Sudden onset of difficulty speaking or slurred speech:    Temporary loss of vision in one eye:     Problems with dizziness:         Gastrointestinal    Blood in stool:     Vomited blood:         Genitourinary    Burning when urinating:     Blood in urine:        Psychiatric    Major depression:         Hematologic    Bleeding problems:    Problems with blood clotting too easily:        Skin    Rashes or ulcers:        Constitutional    Fever or chills:      PHYSICAL EXAM: There were no vitals filed for this visit.  GENERAL: The patient is a well-nourished male, in no acute distress. The vital signs are documented above. CARDIAC: There is a regular rate and rhythm.  VASCULAR: *** PULMONARY: There is good air exchange bilaterally without wheezing or rales. ABDOMEN: Soft and non-tender with normal pitched bowel sounds.  MUSCULOSKELETAL: There are no major deformities or cyanosis. NEUROLOGIC: No focal  weakness or paresthesias are detected. SKIN: There are no ulcers or rashes noted. PSYCHIATRIC: The patient has a normal affect.  DATA:   ***  Assessment/Plan:  83 y.o. male, with history of CKD 5, diabetes, hypertension, hyperlipidemia presents for evaluation of new permanent hemodialysis access.   Lonni DOROTHA Gaskins, MD [facility] Office: [phone]

## 2024-05-28 ENCOUNTER — Ambulatory Visit (INDEPENDENT_AMBULATORY_CARE_PROVIDER_SITE_OTHER)

## 2024-05-28 ENCOUNTER — Ambulatory Visit: Admitting: Vascular Surgery

## 2024-05-28 ENCOUNTER — Ambulatory Visit

## 2024-05-28 ENCOUNTER — Encounter: Payer: Self-pay | Admitting: Vascular Surgery

## 2024-05-28 VITALS — BP 157/78 | HR 89 | Temp 98.0°F | Resp 20 | Ht 67.0 in | Wt 210.0 lb

## 2024-05-28 DIAGNOSIS — N186 End stage renal disease: Secondary | ICD-10-CM

## 2024-05-28 DIAGNOSIS — N185 Chronic kidney disease, stage 5: Secondary | ICD-10-CM | POA: Insufficient documentation

## 2024-06-05 ENCOUNTER — Telehealth: Payer: Self-pay | Admitting: Family Medicine

## 2024-06-05 ENCOUNTER — Ambulatory Visit: Admitting: Family Medicine

## 2024-06-05 ENCOUNTER — Encounter: Payer: Self-pay | Admitting: Family Medicine

## 2024-06-05 VITALS — BP 142/76 | HR 92 | Temp 97.6°F | Ht 67.0 in | Wt 228.2 lb

## 2024-06-05 DIAGNOSIS — N185 Chronic kidney disease, stage 5: Secondary | ICD-10-CM | POA: Diagnosis not present

## 2024-06-05 DIAGNOSIS — H353231 Exudative age-related macular degeneration, bilateral, with active choroidal neovascularization: Secondary | ICD-10-CM | POA: Diagnosis not present

## 2024-06-05 DIAGNOSIS — E1169 Type 2 diabetes mellitus with other specified complication: Secondary | ICD-10-CM | POA: Diagnosis not present

## 2024-06-05 DIAGNOSIS — E1159 Type 2 diabetes mellitus with other circulatory complications: Secondary | ICD-10-CM

## 2024-06-05 DIAGNOSIS — D631 Anemia in chronic kidney disease: Secondary | ICD-10-CM

## 2024-06-05 DIAGNOSIS — E785 Hyperlipidemia, unspecified: Secondary | ICD-10-CM

## 2024-06-05 DIAGNOSIS — L97212 Non-pressure chronic ulcer of right calf with fat layer exposed: Secondary | ICD-10-CM | POA: Diagnosis not present

## 2024-06-05 DIAGNOSIS — Z0001 Encounter for general adult medical examination with abnormal findings: Secondary | ICD-10-CM

## 2024-06-05 DIAGNOSIS — E66811 Obesity, class 1: Secondary | ICD-10-CM | POA: Insufficient documentation

## 2024-06-05 DIAGNOSIS — I152 Hypertension secondary to endocrine disorders: Secondary | ICD-10-CM | POA: Diagnosis not present

## 2024-06-05 DIAGNOSIS — Z8546 Personal history of malignant neoplasm of prostate: Secondary | ICD-10-CM

## 2024-06-05 DIAGNOSIS — I129 Hypertensive chronic kidney disease with stage 1 through stage 4 chronic kidney disease, or unspecified chronic kidney disease: Secondary | ICD-10-CM

## 2024-06-05 DIAGNOSIS — I872 Venous insufficiency (chronic) (peripheral): Secondary | ICD-10-CM

## 2024-06-05 DIAGNOSIS — Z0000 Encounter for general adult medical examination without abnormal findings: Secondary | ICD-10-CM

## 2024-06-05 LAB — BAYER DCA HB A1C WAIVED: HB A1C (BAYER DCA - WAIVED): 5.3 % (ref 4.8–5.6)

## 2024-06-05 MED ORDER — AMLODIPINE BESYLATE 5 MG PO TABS: 5.0000 mg | ORAL_TABLET | Freq: Every day | ORAL | 3 refills | Status: DC

## 2024-06-05 MED ORDER — OLMESARTAN MEDOXOMIL 40 MG PO TABS: 40.0000 mg | ORAL_TABLET | Freq: Every day | ORAL | 3 refills | Status: DC

## 2024-06-05 MED ORDER — ATORVASTATIN CALCIUM 10 MG PO TABS: 10.0000 mg | ORAL_TABLET | Freq: Every day | ORAL | 3 refills | Status: AC

## 2024-06-05 NOTE — Telephone Encounter (Signed)
 Pt scheduled for 2 week follow up 06/19/2024 at 10:15.

## 2024-06-05 NOTE — Progress Notes (Signed)
 Ricardo Mayer. is a 83 y.o. male presents to office today for annual physical exam examination.    Tries to use a mirror to look at the wound on his right lower extremity but has not seen any significant concerns.  Reports no drainage, fevers.  No reports of pain.  Continues to use Xeroform bandages as prescribed.  Occupation: retired, Marital status: married, Substance use: none Health Maintenance Due  Topic Date Due   Colonoscopy  12/17/2023   HEMOGLOBIN A1C  05/16/2024    Immunization History  Administered Date(s) Administered   Fluad Quad(high Dose 65+) 05/16/2019, 05/13/2020, 05/17/2021, 06/03/2022, 05/04/2023   INFLUENZA, HIGH DOSE SEASONAL PF 05/03/2024   Influenza Whole 05/20/2016   Influenza-Unspecified 05/15/2013, 05/15/2014, 06/09/2015   Moderna SARS-COV2 Booster Vaccination 06/07/2020, 11/12/2020, 04/28/2021, 06/15/2022, 05/04/2023   Moderna Sars-Covid-2 Vaccination 08/27/2019, 10/07/2019   Pneumococcal Conjugate-13 10/09/2013   Pneumococcal Polysaccharide-23 07/05/2018   Tdap 04/07/2011, 05/17/2023   Past Medical History:  Diagnosis Date   Adenomatous polyp of colon    Arthritis    bilateral knees   Atypical mole 2016   left shoulder sever wider shave   BCC (basal cell carcinoma of skin) 2011   left neck tx mohs   BCC (basal cell carcinoma of skin) 2015   left sideburn tx with bx   BCC (basal cell carcinoma of skin) 2015   right post shoulder tx with bx   BCC (basal cell carcinoma of skin) 2016   left chest tx with bx   BCC (basal cell carcinoma of skin) 2017   left cheek cx3 36fu   Cataract    CKD (chronic kidney disease), stage II    Dr. 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 Georgina.PCP   History of basal cell cancer 2011   right inner eye tx mohs   History of DVT of lower extremity    2011--  LEFT LEG   History of kidney stones    History of  melanoma excision    RIGHT ARM   Hyperlipidemia    Hypertension    Impaired hearing    hearing aids bilateral   Macular degeneration    Melanoma (HCC) 1990   arm and back per patient  -Melanoma   Melanoma of skin (HCC) 10/09/2013   Near syncope 08/25/2015   Dr. Ida oncefound blood clots in lung   Nocturia    Prostate cancer [facility]) 2014   Dr. Watt manuel- seed implant '14   TIA (transient ischemic attack) 04/08/2016   Ulcer of left lower leg (HCC)    recent pcp visit dr georgina--  ordered betadine  wet/dry dsg daily (03-18-2013)  CONSULT WITH [facility] IN 1 WEEK   Social History   Socioeconomic History   Marital status: Married    Spouse name: Janelle   Number of children: 3   Years of education: master's   Highest education level: Master's degree (e.g., MA, MS, MEng, MEd, MSW, MBA)  Occupational History   Occupation: Press photographer    Comment: retired  Tobacco Use   Smoking status: Never   Smokeless tobacco: Never  Vaping Use   Vaping status: Never Used  Substance and Sexual Activity   Alcohol use: No   Drug use: No   Sexual activity: Not Currently  Other Topics Concern   Not on file  Social History Narrative  Patient drinks 1 cup of coffee daily.   Patient is right handed.    Patient resides locally with his wife.  He has 3 sons, 2 sons reside in [place] where the other one resides in [facility] [place] .  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   Social Drivers of Corporate investment banker Strain: Low Risk  (08/20/2023)   Overall Financial Resource Strain (CARDIA)    Difficulty of Paying Living Expenses: Not very hard  Food Insecurity: No Food Insecurity (08/20/2023)   Hunger Vital Sign    Worried About Running Out of Food in the Last Year: Never true    Ran Out of Food in the Last Year: Never true   Transportation Needs: No Transportation Needs (08/20/2023)   PRAPARE - Administrator, Civil Service (Medical): No    Lack of Transportation (Non-Medical): No  Physical Activity: Sufficiently Active (08/20/2023)   Exercise Vital Sign    Days of Exercise per Week: 7 days    Minutes of Exercise per Session: 30 min  Stress: No Stress Concern Present (08/20/2023)   [employer] of Occupational Health - Occupational Stress Questionnaire    Feeling of Stress : Not at all  Social Connections: Socially Integrated (08/20/2023)   Social Connection and Isolation Panel    Frequency of Communication with Friends and Family: More than three times a week    Frequency of Social Gatherings with Friends and Family: Once a week    Attends Religious Services: More than 4 times per year    Active Member of [employer] or Organizations: Yes    Attends Engineer, structural: More than 4 times per year    Marital Status: Married  Catering manager Violence: Not At Risk (08/21/2023)   Humiliation, Afraid, Rape, and Kick questionnaire    Fear of Current or Ex-Partner: No    Emotionally Abused: No    Physically Abused: No    Sexually Abused: No   Past Surgical History:  Procedure Laterality Date   CATARACT EXTRACTION W/PHACO Left 10/25/2021   Procedure: CATARACT EXTRACTION PHACO AND INTRAOCULAR LENS PLACEMENT (IOC);  Surgeon: Harrie Agent, MD;  Location: [facility];  Service: Ophthalmology;  Laterality: Left;  CDE: 24.87   CATARACT EXTRACTION W/PHACO Right 04/19/2024   Procedure: PHACOEMULSIFICATION, CATARACT, WITH IOL INSERTION;  Surgeon: Harrie Agent, MD;  Location: [facility];  Service: Ophthalmology;  Laterality: Right;  CDE: 11.49   COLONOSCOPY     EYE SURGERY     INSERTION, STENT, DRUG-ELUTING, LACRIMAL CANALICULUS Right 04/19/2024   Procedure: INSERTION, STENT, DRUG-ELUTING, LACRIMAL CANALICULUS;  Surgeon: Harrie Agent, MD;  Location: [facility];  Service: Ophthalmology;  Laterality: Right;   JOINT  REPLACEMENT     KIDNEY STONE EXTRACTIONS  LAST ONE 1970   X2 OPEN/  X1  URETEROSCOPY   MELANOMA EXCISION RIGHT ARM  1990   POLYPECTOMY     PROSTATE BIOPSY     RADIOACTIVE SEED IMPLANT N/A 03/21/2013   Procedure: RADIOACTIVE SEED IMPLANT;  Surgeon: Norleen JINNY Seltzer, MD;  Location: [facility];  Service: Urology;  Laterality: N/A;  87 seeds implanted     TONSILLECTOMY     TOTAL KNEE ARTHROPLASTY Right 03/21/2016   Procedure: RIGHT TOTAL KNEE ARTHROPLASTY;  Surgeon: Donnice Car, MD;  Location: [facility];  Service: Orthopedics;  Laterality: Right;  Family History  Problem Relation Age of Onset   Breast cancer Mother    Stroke Mother    Hypertension Mother    Lung cancer Father        smoked   Ulcers Father    Heart disease Brother 81       stent   Esophageal cancer Brother    Cancer Paternal Uncle        prostate   Cancer Paternal Grandfather        prostate   Heart attack Neg Hx    Colon cancer Neg Hx    Colon polyps Neg Hx    Rectal cancer Neg Hx    Stomach cancer Neg Hx     Current Outpatient Medications:    aspirin  325 MG tablet, Take 1 tablet (325 mg total) by mouth daily., Disp: 90 tablet, Rfl: 3   azelastine  (ASTELIN ) 0.1 % nasal spray, Place 1 spray into both nostrils 2 (two) times daily. For runny nose, Disp: 30 mL, Rfl: 12   cholecalciferol (VITAMIN D3) 25 MCG (1000 UT) tablet, Take 1,000 Units by mouth daily., Disp: , Rfl:    doxazosin (CARDURA) 1 MG tablet, Take 1 mg by mouth at bedtime., Disp: , Rfl:    furosemide  (LASIX ) 20 MG tablet, TAKE 1 AND 1/2 TABLETS BY MOUTH  DAILY AS NEEDED FOR SWELLING, Disp: 150 tablet, Rfl: 0   sodium zirconium cyclosilicate  (LOKELMA ) 10 g PACK packet, Take 10 g by mouth daily., Disp: 1 packet, Rfl: 0   amLODipine  (NORVASC ) 5 MG tablet, Take 1 tablet (5 mg total) by mouth daily., Disp: 100 tablet, Rfl: 3   atorvastatin  (LIPITOR) 10 MG tablet, Take 1 tablet (10 mg total) by mouth daily., Disp: 100 tablet, Rfl: 3   benzonatate   (TESSALON  PERLES) 100 MG capsule, Take 1 capsule (100 mg total) by mouth 3 (three) times daily as needed for cough. (Patient not taking: Reported on 06/05/2024), Disp: 20 capsule, Rfl: 0   loratadine  (CLARITIN ) 10 MG tablet, Take 1 tablet EVERY OTHER day.  RENAL DOSING!!! Please reiterate this. (Patient not taking: Reported on 06/05/2024), Disp: 30 tablet, Rfl: 11   olmesartan  (BENICAR ) 40 MG tablet, Take 1 tablet (40 mg total) by mouth daily., Disp: 100 tablet, Rfl: 3  No Known Allergies   ROS: Review of Systems Pertinent items noted in HPI and remainder of comprehensive ROS otherwise negative.    Physical exam BP (!) 142/76   Pulse 92   Temp 97.6 F (36.4 C)   Ht 5' 7 (1.702 m)   Wt 228 lb 4 oz (103.5 kg)   SpO2 100%   BMI 35.75 kg/m  General appearance: alert, cooperative, appears stated age, no distress, and morbidly obese Head: Normocephalic, without obvious abnormality, atraumatic Eyes: negative findings: lids and lashes normal, conjunctivae and sclerae normal, corneas clear, pupils equal, round, reactive to light and accomodation, and wears glasses Ears: normal TM's and external ear canals both ears and wears hearing aids Nose: Nares normal. Septum midline. Mucosa normal. No drainage or sinus tenderness. Throat: lips, mucosa, and tongue normal; teeth and gums normal Neck: no adenopathy, no carotid bruit, supple, symmetrical, trachea midline, and thyroid  not enlarged, symmetric, no tenderness/mass/nodules Back: Increased kyphosis of thoracic spine.  Ambulates independently with slow hunched station Lungs: clear to auscultation bilaterally Chest wall: no tenderness Heart: regular rate and rhythm, S1, S2 normal, no murmur, click, rub or gallop Abdomen: Obese, soft, nontender and nondistended Extremities: Venous stasis changes to bilateral lower extremities with  venous stasis ulcer as below Pulses: +1 pedal pulses Skin: Multiple pigmented nevi.  Very large seborrheic keratosis  along the right temple.  He has a venous stasis ulcer that is healing as below Lymph nodes: Supraclavicular and cervical lymph nodes normal size Neurologic: Hard of hearing.  Otherwise no focal neurologic deficits    Diabetic Foot Exam - Simple   Simple Foot Form Diabetic Foot exam was performed with the following findings: Yes 06/05/2024  8:54 AM  Visual Inspection See comments: Yes Sensation Testing Intact to touch and monofilament testing bilaterally: Yes Pulse Check See comments: Yes Comments +1 pulses bilaterally.  He has onychomycotic changes to the nails of all toenails bilaterally       05/03/2024    3:50 PM 04/26/2024    1:59 PM 04/18/2024    8:43 AM  Depression screen PHQ 2/9  Decreased Interest 0 1 0  Down, Depressed, Hopeless 0 1 0  PHQ - 2 Score 0 2 0  Altered sleeping 1 1 2   Tired, decreased energy 0 1 1  Change in appetite 0 1 0  Feeling bad or failure about yourself  0 0 0  Trouble concentrating 0 0 0  Moving slowly or fidgety/restless 0 0 0  Suicidal thoughts 0 0 0  PHQ-9 Score 1 5 3   Difficult doing work/chores Not difficult at all Not difficult at all Not difficult at all      05/03/2024    3:51 PM 04/26/2024    1:59 PM 04/18/2024    8:43 AM 11/15/2023    9:08 AM  GAD 7 : Generalized Anxiety Score  Nervous, Anxious, on Edge 0 0 0 0  Control/stop worrying 0 0 0 0  Worry too much - different things 0 0 0 0  Trouble relaxing 0 0 0 0  Restless 0 0 0 0  Easily annoyed or irritable 0 0 0 0  Afraid - awful might happen 0 0 0 0  Total GAD 7 Score 0 0 0 0  Anxiety Difficulty Not difficult at all Not difficult at all Not difficult at all Not difficult at all       Assessment/ Plan: Ricardo Mayer. here for annual physical exam.   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  He will continue to follow-up with ophthalmology for ongoing injections of the eyes for macular degeneration.  His stasis ulcer is actually looking much better and is about 75% filled in.  No evidence of infection.  Reinforced continued Xeroform dressing changes.  We can evaluate in the next 2 to 4 weeks for recheck, sooner if concerns arise  Medications have been renewed for patient.  His diabetes has been diet controlled.  I reviewed the labs that were obtained by his renal  specialist recently and these were not repeated today.  I included them in the note as above.  Will be seeing a specialist at some point to discuss possible HD  He does have some urinary incontinence.  Has history of prostate cancer so check PSA  Counseled on healthy lifestyle choices, including diet (rich in fruits, vegetables and lean meats and low in salt and simple carbohydrates) and exercise (at least 30 minutes of moderate physical activity daily).  Patient to follow up 2 weeks for wound recheck  Kimberla Driskill M. Jolinda, DO

## 2024-06-05 NOTE — Telephone Encounter (Signed)
 Pt needs two week rck for wound. Dr KANDICE didn't have anything and nurse said to put message in.

## 2024-06-06 LAB — LIPID PANEL
Chol/HDL Ratio: 1.5 ratio (ref 0.0–5.0)
Cholesterol, Total: 88 mg/dL — ABNORMAL LOW (ref 100–199)
HDL: 60 mg/dL (ref >39)
LDL Chol Calc (NIH): 17 mg/dL (ref 0–99)
Triglycerides: 34 mg/dL (ref 0–149)
VLDL Cholesterol Cal: 11 mg/dL (ref 5–40)

## 2024-06-06 LAB — PSA: Prostate Specific Ag, Serum: <0.1 ng/mL (ref 0.0–4.0)

## 2024-06-07 ENCOUNTER — Ambulatory Visit: Payer: Self-pay | Admitting: Family Medicine

## 2024-06-07 ENCOUNTER — Telehealth: Payer: Self-pay | Admitting: Family Medicine

## 2024-06-07 NOTE — Telephone Encounter (Signed)
 Copied from CRM #[id number]. Topic: Clinical - Lab/Test Results >> Jun 07, 2024 11:00 AM Avram MATSU wrote: Reason for CRM: I relayed the test results to the pt

## 2024-06-10 ENCOUNTER — Encounter: Payer: Self-pay | Admitting: Family Medicine

## 2024-06-10 ENCOUNTER — Ambulatory Visit

## 2024-06-10 ENCOUNTER — Ambulatory Visit (INDEPENDENT_AMBULATORY_CARE_PROVIDER_SITE_OTHER): Admitting: Family Medicine

## 2024-06-10 VITALS — BP 132/73 | HR 96 | Temp 98.8°F | Ht 67.0 in | Wt 229.6 lb

## 2024-06-10 DIAGNOSIS — N185 Chronic kidney disease, stage 5: Secondary | ICD-10-CM | POA: Diagnosis not present

## 2024-06-10 DIAGNOSIS — J069 Acute upper respiratory infection, unspecified: Secondary | ICD-10-CM | POA: Diagnosis not present

## 2024-06-10 DIAGNOSIS — E1159 Type 2 diabetes mellitus with other circulatory complications: Secondary | ICD-10-CM

## 2024-06-10 MED ORDER — BENZONATATE 100 MG PO CAPS: 100.0000 mg | ORAL_CAPSULE | Freq: Two times a day (BID) | ORAL | 0 refills | Status: DC | PRN

## 2024-06-10 NOTE — Progress Notes (Signed)
 Acute Office Visit  Subjective:     Patient ID: Ricardo Dowson., male    DOB: 23-Jul-1941, 83 y.o.   MRN: [id number]  Chief Complaint  Patient presents with   Cough    Cough    History of Present Illness   Ricardo Hoe. is an 83 year old male who presents with a worsening cough and phlegm production for three days. He is accompanied by Ricardo Mayer, his caregiver.  Cough and sputum production - Cough present for three days, progressively worsening - Productive cough with initial yellowish phlegm, later becoming clear and thick - Cough frequently triggered by deep inspiration and exhalation - Associated with nocturnal symptoms causing fatigue due to poor sleep - shortness of breath with cough - No sore throat, ear pain, or headaches  Respiratory symptoms - Nasal congestion present - Mild shortness of breath, particularly during coughing episodes - No history of asthma or COPD - No recent exposure to individuals known to be ill  Constitutional and gastrointestinal symptoms - Fatigue attributed to sleep disruption from cough - No fever or chills - Single episode of diarrhea - No nausea or vomiting  Medication use - Using Robitussin and another cough syrup, uncertain of efficacy - Regular use of allergy medication       Review of Systems  Respiratory:  Positive for cough.    As per HPI.     Objective:    BP 132/73   Pulse 96   Temp 98.8 F (37.1 C) (Temporal)   Ht 5' 7 (1.702 m)   Wt 229 lb 9.6 oz (104.1 kg)   SpO2 100%   BMI 35.96 kg/m    Physical Exam Vitals and nursing note reviewed.  Constitutional:      General: He is not in acute distress.    Appearance: He is obese. He is not toxic-appearing or diaphoretic.  HENT:     Head: Normocephalic and atraumatic.     Right Ear: Tympanic membrane, ear canal and external ear normal.     Left Ear: Tympanic membrane, ear canal and external ear normal.     Nose: Congestion present.     Right Sinus: No  maxillary sinus tenderness or frontal sinus tenderness.     Left Sinus: No maxillary sinus tenderness or frontal sinus tenderness.     Mouth/Throat:     Mouth: Mucous membranes are moist.     Pharynx: Oropharynx is clear. No oropharyngeal exudate or posterior oropharyngeal erythema.     Tonsils: No tonsillar exudate or tonsillar abscesses. 0 on the right. 0 on the left.  Eyes:     General: No scleral icterus.       Right eye: No discharge.        Left eye: No discharge.     Conjunctiva/sclera: Conjunctivae normal.  Cardiovascular:     Rate and Rhythm: Normal rate and regular rhythm.     Heart sounds: Normal heart sounds. No murmur heard. Pulmonary:     Effort: Pulmonary effort is normal. No respiratory distress.     Breath sounds: Normal breath sounds. No wheezing, rhonchi or rales.  Chest:     Chest wall: No tenderness.  Abdominal:     General: There is no distension.     Palpations: Abdomen is soft.     Tenderness: There is no guarding or rebound.  Musculoskeletal:     Cervical back: Neck supple. No rigidity.     Right lower leg: No edema.  Left lower leg: No edema.  Skin:    General: Skin is warm and dry.  Neurological:     Mental Status: He is alert and oriented to person, place, and time. Mental status is at baseline.  Psychiatric:        Mood and Affect: Mood normal.        Behavior: Behavior normal.     No results found for any visits on 06/10/24.      Assessment & Plan:   Ricardo Mayer was seen today for cough.  Diagnoses and all orders for this visit:  Viral URI with cough -     benzonatate  (TESSALON ) 100 MG capsule; Take 1 capsule (100 mg total) by mouth 2 (two) times daily as needed for cough. -     Novel Coronavirus, NAA ([facility])  Type 2 diabetes mellitus with vascular disease (HCC)  CKD (chronic kidney disease) stage 5, GFR less than 15 ml/min (HCC)   Assessment and Plan    Acute viral upper respiratory infection Acute viral upper respiratory  infection with productive cough, nasal congestion, and mild dyspnea with coughing. Symptomatic care recommended. Discussed antiviral treatment if Covid positive given hx of T2DM with CKD stage 5. Last GFR 10, will need renal dosing.  - Order COVID-19 test. - Prescribe Tessalon  Perles for cough. - Continue Robitussin and allergy medication. - Advise to report fever, worsening dyspnea, or chest pain immediately.      Return to office for new or worsening symptoms, or if symptoms persist.    Annabella CHRISTELLA Search, FNP

## 2024-06-11 LAB — NOVEL CORONAVIRUS, NAA: SARS-CoV-2, NAA: NOT DETECTED

## 2024-06-12 ENCOUNTER — Ambulatory Visit: Payer: Self-pay | Admitting: Family Medicine

## 2024-06-19 ENCOUNTER — Ambulatory Visit

## 2024-06-19 ENCOUNTER — Ambulatory Visit (INDEPENDENT_AMBULATORY_CARE_PROVIDER_SITE_OTHER): Payer: Self-pay | Admitting: Family Medicine

## 2024-06-19 ENCOUNTER — Encounter: Payer: Self-pay | Admitting: Family Medicine

## 2024-06-19 ENCOUNTER — Ambulatory Visit: Payer: Self-pay | Admitting: Family Medicine

## 2024-06-19 VITALS — BP 155/75 | HR 96 | Temp 97.5°F | Ht 67.0 in | Wt 227.5 lb

## 2024-06-19 DIAGNOSIS — L97212 Non-pressure chronic ulcer of right calf with fat layer exposed: Secondary | ICD-10-CM | POA: Diagnosis not present

## 2024-06-19 DIAGNOSIS — R059 Cough, unspecified: Secondary | ICD-10-CM | POA: Diagnosis not present

## 2024-06-19 DIAGNOSIS — I872 Venous insufficiency (chronic) (peripheral): Secondary | ICD-10-CM

## 2024-06-19 MED ORDER — CEFDINIR 300 MG PO CAPS: 300.0000 mg | ORAL_CAPSULE | Freq: Two times a day (BID) | ORAL | 0 refills | Status: DC

## 2024-06-19 MED ORDER — CEFDINIR 300 MG PO CAPS: 300.0000 mg | ORAL_CAPSULE | Freq: Every day | ORAL | 0 refills | Status: AC

## 2024-06-19 NOTE — Telephone Encounter (Signed)
 Patient aware and verbalized understanding.

## 2024-06-19 NOTE — Progress Notes (Signed)
 Subjective: CC:f/u wound PCP: [name], DO YEP:Mnwzb D Catrell Morrone. is a 83 y.o. male presenting to clinic today for:  Patient is here for 2-week interval follow-up on his wound of the right lower extremity he continues to use Xeroform bandages as directed and at last visit the wound appeared to be shrinking.  He is accompanied today by his wife who notes that he did come down with some type of URI last week and was placed on Tessalon  Perles for presumed viral URI.  He continues to have copious coughing, drainage and just seems rundown with very little energy.  He also seems a bit more dyspneic on exertion than he normally is.  No hemoptysis reported.  Using Afrin but no Astelin .  She was sick with similar.  No fevers reported   ROS: Per HPI  No Known Allergies Past Medical History:  Diagnosis Date   Adenomatous polyp of colon    Arthritis    bilateral knees   Atypical mole 2016   left shoulder sever wider shave   BCC (basal cell carcinoma of skin) 2011   left neck tx mohs   BCC (basal cell carcinoma of skin) 2015   left sideburn tx with bx   BCC (basal cell carcinoma of skin) 2015   right post shoulder tx with bx   BCC (basal cell carcinoma of skin) 2016   left chest tx with bx   BCC (basal cell carcinoma of skin) 2017   left cheek cx3 76fu   Cataract    CKD (chronic kidney disease), stage II    Dr. 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 Georgina.PCP   History of basal cell cancer 2011   right inner eye tx mohs   History of DVT of lower extremity    2011--  LEFT LEG   History of kidney stones    History of melanoma excision    RIGHT ARM   Hyperlipidemia    Hypertension    Impaired hearing    hearing aids bilateral   Macular degeneration    Melanoma (HCC) 1990   arm and back per patient  -Melanoma   Melanoma of skin (HCC) 10/09/2013   Near syncope  08/25/2015   Dr. Ida oncefound blood clots in lung   Nocturia    Prostate cancer [facility]) 2014   Dr. Watt manuel- seed implant '14   TIA (transient ischemic attack) 04/08/2016   Ulcer of left lower leg (HCC)    recent pcp visit dr georgina--  ordered betadine  wet/dry dsg daily (03-18-2013)  CONSULT WITH WOUND CARE CENTER IN 1 WEEK    Current Outpatient Medications:    amLODipine  (NORVASC ) 5 MG tablet, Take 1 tablet (5 mg total) by mouth daily., Disp: 100 tablet, Rfl: 3   aspirin  325 MG tablet, Take 1 tablet (325 mg total) by mouth daily., Disp: 90 tablet, Rfl: 3   atorvastatin  (LIPITOR) 10 MG tablet, Take 1 tablet (10 mg total) by mouth daily., Disp: 100 tablet, Rfl: 3   azelastine  (ASTELIN ) 0.1 % nasal spray, Place 1 spray into both nostrils 2 (two) times daily. For runny nose, Disp: 30 mL, Rfl: 12   benzonatate  (TESSALON ) 100 MG capsule, Take 1 capsule (100 mg total) by mouth 2 (two) times daily as needed for cough., Disp: 20 capsule, Rfl: 0   cholecalciferol (VITAMIN D3) 25 MCG (1000  UT) tablet, Take 1,000 Units by mouth daily., Disp: , Rfl:    doxazosin (CARDURA) 1 MG tablet, Take 1 mg by mouth at bedtime., Disp: , Rfl:    furosemide  (LASIX ) 20 MG tablet, TAKE 1 AND 1/2 TABLETS BY MOUTH  DAILY AS NEEDED FOR SWELLING, Disp: 150 tablet, Rfl: 0   loratadine  (CLARITIN ) 10 MG tablet, Take 1 tablet EVERY OTHER day.  RENAL DOSING!!! Please reiterate this., Disp: 30 tablet, Rfl: 11   olmesartan  (BENICAR ) 40 MG tablet, Take 1 tablet (40 mg total) by mouth daily., Disp: 100 tablet, Rfl: 3   sodium zirconium cyclosilicate  (LOKELMA ) 10 g PACK packet, Take 10 g by mouth daily., Disp: 1 packet, Rfl: 0   cefdinir (OMNICEF) 300 MG capsule, Take 1 capsule (300 mg total) by mouth daily for 10 days. **dose change for GFR 10, Disp: 10 capsule, Rfl: 0 Social History   Socioeconomic History   Marital status: Married    Spouse name: Janelle   Number of children: 3   Years of education: master's    Highest education level: Master's degree (e.g., MA, MS, MEng, MEd, MSW, MBA)  Occupational History   Occupation: press photographer    Comment: retired  Tobacco Use   Smoking status: Never   Smokeless tobacco: Never  Vaping Use   Vaping status: Never Used  Substance and Sexual Activity   Alcohol use: No   Drug use: No   Sexual activity: Not Currently  Other Topics Concern   Not on file  Social History Narrative   Patient drinks 1 cup of coffee daily.   Patient is right handed.    Patient resides locally with his wife.  He has 3 sons, 2 sons reside in [place] where the other one resides in [facility] .  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   Social Drivers of Corporate Investment Banker Strain: Low Risk  (08/20/2023)   Overall Financial Resource Strain (CARDIA)    Difficulty of Paying Living Expenses: Not very hard  Food Insecurity: No Food Insecurity (08/20/2023)   Hunger Vital Sign    Worried About Running Out of Food in the Last Year: Never true    Ran Out of Food in the Last Year: Never true  Transportation Needs: No Transportation Needs (08/20/2023)   PRAPARE - Administrator, Civil Service (Medical): No    Lack of Transportation (Non-Medical): No  Physical Activity: Sufficiently Active (08/20/2023)   Exercise Vital Sign    Days of Exercise per Week: 7 days    Minutes of Exercise per Session: 30 min  Stress: No Stress Concern Present (08/20/2023)   [employer] of Occupational Health - Occupational Stress Questionnaire    Feeling of Stress : Not at all  Social Connections: Socially Integrated (08/20/2023)   Social Connection and Isolation Panel    Frequency of Communication with Friends and Family: More than three times a week    Frequency of Social Gatherings with Friends and Family: Once a week    Attends Religious  Services: More than 4 times per year    Active Member of [employer] or Organizations: Yes    Attends Banker Meetings: More than 4 times per year    Marital Status: Married  Catering Manager Violence: Not At Risk (08/21/2023)   Humiliation, Afraid, Rape, and Kick  questionnaire    Fear of Current or Ex-Partner: No    Emotionally Abused: No    Physically Abused: No    Sexually Abused: No   Family History  Problem Relation Age of Onset   Breast cancer Mother    Stroke Mother    Hypertension Mother    Lung cancer Father        smoked   Ulcers Father    Heart disease Brother 15       stent   Esophageal cancer Brother    Cancer Paternal Uncle        prostate   Cancer Paternal Grandfather        prostate   Heart attack Neg Hx    Colon cancer Neg Hx    Colon polyps Neg Hx    Rectal cancer Neg Hx    Stomach cancer Neg Hx     Objective: Office vital signs reviewed. BP (!) 155/75   Pulse 96   Temp (!) 97.5 F (36.4 C)   Ht 5' 7 (1.702 m)   Wt 227 lb 8 oz (103.2 kg)   SpO2 100%   BMI 35.63 kg/m   Physical Examination:  General: Awake, alert, nontoxic elderly male, No acute distress HEENT: sclera white, MMM Cardio: regular rate and rhythm, S1S2 heard, no murmurs appreciated Pulm: Mildly coarse breath sounds at the bases.  Otherwise clear to auscultation bilaterally with normal work of breathing on room air.  No observed coughing but he did seem a bit more dyspneic with exertion than I think he normally looks Skin: Persistent stasis ulcer noted along the right lateral lower calf.  The lesion that was just superior to this that appeared about resolved looks to be deepening again and this particular lesion seems to be widening though it has not gotten deeper.  There is no exudates or warmth.  See photos below     DG Chest 2 View Result Date: 06/19/2024 EXAM: 2 VIEW(S) XRAY OF THE CHEST 06/19/2024 10:58:22 AM COMPARISON: Prior studies are available. CLINICAL HISTORY:  persistent cough FINDINGS: LUNGS AND PLEURA: Minimal bibasilar atelectasis is noted. Minimal pleural effusions. No focal pulmonary opacity. No pulmonary edema. No pneumothorax. HEART AND MEDIASTINUM: No acute abnormality of the cardiac and mediastinal silhouettes. BONES AND SOFT TISSUES: No acute osseous abnormality. IMPRESSION: 1. Minimal bibasilar atelectasis. 2. Minimal pleural effusions. Electronically signed by: Lynwood Seip MD 06/19/2024 11:37 AM EST RP Workstation: [id number]   Assessment/ Plan: 83 y.o. male   Cough in adult - Plan: DG Chest 2 View, cefdinir (OMNICEF) 300 MG capsule, DISCONTINUED: cefdinir (OMNICEF) 300 MG capsule  Venous stasis ulcer of right calf with fat layer exposed without varicose veins (HCC) - Plan: Ambulatory referral to Orthopedic Surgery   Chest x-ray demonstrates some mild atelectasis and bilateral pleural effusions.  Given recent URI, immunocompromise state etc. I am going to place him on renally dosed Omnicef 300 mg daily to cover any early bacterial infection.  This should also cover skin  Stat referral to Dr. Harden for his venous stasis ulcer that was improving a couple of weeks ago but now seems to be getting wider again.  He has been undergoing treatment outpatient with Xeroform bandages and regular cleansing for almost 2 months now with no resolution.   Norene CHRISTELLA Fielding, DO [facility] [phone]

## 2024-06-20 ENCOUNTER — Ambulatory Visit: Admitting: Physician Assistant

## 2024-06-20 DIAGNOSIS — I872 Venous insufficiency (chronic) (peripheral): Secondary | ICD-10-CM

## 2024-06-21 ENCOUNTER — Encounter: Payer: Self-pay | Admitting: Physician Assistant

## 2024-06-21 NOTE — Progress Notes (Signed)
 Office Visit Note   Patient: Ricardo Mayer.           Date of Birth: 31-Jul-1941           MRN: [id number] Visit Date: 06/20/2024              Requested by: [name], DO [address] [place],  [place] [address] PCP: [name], DO  Chief Complaint  Patient presents with   Right Leg - Wound Check      HPI: 83 y/o male with chronic venous insufficiency he is being followed by his PCP for current wound care of the right LE lateral leg venous ulcer.   He continues to use Xeroform bandages as directed.  At this point his wound does not appear to healing and is stagnant.  He has been treating it with xeroform for 2 month now.  He has been using compression socks in the past and elevation while sitting in a recliner.  He tends to fall asleep in the recliner during to the day.  He does sleep in a bed at night.    He states he has had increased edema in the right > let LE since he had right knee surgery in the past 10 years ago.  He has a history of DVT in the right LE study done     Assessment & Plan: Visit Diagnoses:  1. Venous insufficiency (chronic) (peripheral)     Plan: Clean wound and peri wound daily with Vashe.  Then apply Vashe wet to dry dressing with ace wrap compression.  May shower as needed.  The most important thing is to elevate your legs multiple times daily in the supine position.  Our future goal will be to wear compression socks daily in the future when the wound is healed.  He was given a handout on proper elevation.    Follow-Up Instructions: Return in about 1 week (around 06/27/2024).   Ortho Exam  Patient is alert, oriented, no adenopathy, well-dressed, normal affect, normal respiratory effort. Lateral v shaped wound with total length of 4.6 cm and width 0.7 cm.  The right leg calf measures 46 cm in circumference which is larger than the left.  Chronic brawny skin change BLE. With dermatis and thickened skin.  Moderate edema. No cellulitis or  weeping.  Palpable DP pulses B LE.     Imaging: No results found.     Labs: Lab Results  Component Value Date   HGBA1C 5.3 06/05/2024   HGBA1C 5.3 11/15/2023   HGBA1C 5.3 05/17/2023   LABURIC 6.1 08/28/2014   GRAMSTAIN No WBC Seen 02/05/2013   GRAMSTAIN No Squamous Epithelial Cells Seen 02/05/2013   GRAMSTAIN Moderate Gram Positive Cocci In Clusters In Pairs 02/05/2013   GRAMSTAIN Moderate Yeast 02/05/2013   LABORGA Multiple Organisms Present,None Predominant 02/05/2013     Lab Results  Component Value Date   ALBUMIN 3.6 04/01/2024   ALBUMIN 3.9 03/25/2024   ALBUMIN 4.1 11/15/2023    Lab Results  Component Value Date   MG 2.0 01/10/2020   MG 2.2 10/07/2015   MG 2.0 04/15/2015   Lab Results  Component Value Date   VD25OH 33.0 07/03/2019   VD25OH 15.9 (L) 10/24/2018   VD25OH 24.0 (L) 06/07/2018    No results found for: PREALBUMIN    Latest Ref Rng & Units 04/01/2024   11:18 AM 03/25/2024    3:27 PM 05/13/2020    8:29 AM  CBC EXTENDED  WBC 4.0 - 10.5 K/uL 6.4  7.6  7.7   RBC 4.22 - 5.81 MIL/uL 3.27  3.33  4.20   Hemoglobin 13.0 - 17.0 g/dL 89.6  89.4  86.9   HCT 39.0 - 52.0 % 30.7  31.5  37.8   Platelets 150 - 400 K/uL 137  143  140   NEUT# 1.7 - 7.7 K/uL 4.6  4.9    Lymph# 0.7 - 4.0 K/uL 1.2  1.9       There is no height or weight on file to calculate BMI.  Orders:  No orders of the defined types were placed in this encounter.  No orders of the defined types were placed in this encounter.    Procedures: No procedures performed  Clinical Data: No additional findings.  ROS:  All other systems negative, except as noted in the HPI. Review of Systems  Objective: Vital Signs: There were no vitals taken for this visit.  Specialty Comments:  No specialty comments available.  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/10/2017   Hyperlipidemia associated with type 2 diabetes mellitus (HCC) 07/13/2016   Hypertension associated with type 2 diabetes mellitus (HCC) 07/13/2016   Vitamin D  deficiency 07/13/2016   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   S/P knee replacement 03/21/2016   Anemia associated with chronic renal failure 10/28/2015   Hyperparathyroidism , secondary, non-renal 10/28/2015   Type 2 diabetes mellitus with vascular disease (HCC) 06/02/2015   Peripheral vascular insufficiency (HCC) 10/09/2013   Past Medical History:  Diagnosis Date   Adenomatous polyp of colon    Arthritis    bilateral knees   Atypical mole 2016   left shoulder sever wider shave   BCC (basal cell carcinoma of skin) 2011   left neck tx mohs   BCC (basal cell carcinoma of skin) 2015   left sideburn tx with bx   BCC (basal cell carcinoma of skin) 2015   right post shoulder tx with bx   BCC (basal cell carcinoma of skin) 2016   left chest tx with bx   BCC (basal cell carcinoma of skin) 2017   left cheek cx3 23fu   Cataract    CKD (chronic kidney disease), stage II    Dr. 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 Georgina.PCP   History of basal cell cancer 2011   right inner eye tx mohs   History of DVT of lower extremity    2011--  LEFT LEG   History of kidney stones    History of melanoma excision    RIGHT ARM   Hyperlipidemia    Hypertension    Impaired hearing    hearing aids bilateral   Macular degeneration    Melanoma (HCC) 1990   arm and back per patient  -Melanoma   Melanoma of skin (HCC) 10/09/2013   Near  syncope 08/25/2015   Dr. Ida oncefound blood clots in lung  Nocturia    Prostate cancer [facility]) 2014   Dr. Watt manuel- seed implant '14   TIA (transient ischemic attack) 04/08/2016   Ulcer of left lower leg (HCC)    recent pcp visit dr georgina--  ordered betadine  wet/dry dsg daily (03-18-2013)  CONSULT WITH WOUND CARE CENTER IN 1 WEEK    Family History  Problem Relation Age of Onset   Breast cancer Mother    Stroke Mother    Hypertension Mother    Lung cancer Father        smoked   Ulcers Father    Heart disease Brother 73       stent   Esophageal cancer Brother    Cancer Paternal Uncle        prostate   Cancer Paternal Grandfather        prostate   Heart attack Neg Hx    Colon cancer Neg Hx    Colon polyps Neg Hx    Rectal cancer Neg Hx    Stomach cancer Neg Hx     Past Surgical History:  Procedure Laterality Date   CATARACT EXTRACTION W/PHACO Left 10/25/2021   Procedure: CATARACT EXTRACTION PHACO AND INTRAOCULAR LENS PLACEMENT (IOC);  Surgeon: Harrie Agent, MD;  Location: [facility];  Service: Ophthalmology;  Laterality: Left;  CDE: 24.87   CATARACT EXTRACTION W/PHACO Right 04/19/2024   Procedure: PHACOEMULSIFICATION, CATARACT, WITH IOL INSERTION;  Surgeon: Harrie Agent, MD;  Location: [facility];  Service: Ophthalmology;  Laterality: Right;  CDE: 11.49   COLONOSCOPY     EYE SURGERY     INSERTION, STENT, DRUG-ELUTING, LACRIMAL CANALICULUS Right 04/19/2024   Procedure: INSERTION, STENT, DRUG-ELUTING, LACRIMAL CANALICULUS;  Surgeon: Harrie Agent, MD;  Location: [facility];  Service: Ophthalmology;  Laterality: Right;   JOINT REPLACEMENT     KIDNEY STONE EXTRACTIONS  LAST ONE 1970   X2 OPEN/  X1  URETEROSCOPY   MELANOMA EXCISION RIGHT ARM  1990   POLYPECTOMY     PROSTATE BIOPSY     RADIOACTIVE SEED IMPLANT N/A 03/21/2013   Procedure: RADIOACTIVE SEED IMPLANT;  Surgeon: Norleen JINNY Watt, MD;  [facility];  Service: Urology;  Laterality: N/A;  87  seeds implanted     TONSILLECTOMY     TOTAL KNEE ARTHROPLASTY Right 03/21/2016   Procedure: RIGHT TOTAL KNEE ARTHROPLASTY;  Surgeon: Donnice Car, MD;  Location: [facility];  Service: Orthopedics;  Laterality: Right;   Social History   Occupational History   Occupation: press photographer    Comment: retired  Tobacco Use   Smoking status: Never   Smokeless tobacco: Never  Vaping Use   Vaping status: Never Used  Substance and Sexual Activity   Alcohol use: No   Drug use: No   Sexual activity: Not Currently

## 2024-06-24 ENCOUNTER — Ambulatory Visit: Payer: Self-pay

## 2024-06-24 ENCOUNTER — Ambulatory Visit (INDEPENDENT_AMBULATORY_CARE_PROVIDER_SITE_OTHER): Admitting: Family

## 2024-06-24 ENCOUNTER — Telehealth: Payer: Self-pay

## 2024-06-24 ENCOUNTER — Other Ambulatory Visit

## 2024-06-24 ENCOUNTER — Encounter: Payer: Self-pay | Admitting: Family

## 2024-06-24 VITALS — BP 171/85 | HR 99 | Temp 97.7°F | Ht 67.0 in | Wt 225.6 lb

## 2024-06-24 DIAGNOSIS — J208 Acute bronchitis due to other specified organisms: Secondary | ICD-10-CM | POA: Diagnosis not present

## 2024-06-24 DIAGNOSIS — J329 Chronic sinusitis, unspecified: Secondary | ICD-10-CM | POA: Diagnosis not present

## 2024-06-24 DIAGNOSIS — B9689 Other specified bacterial agents as the cause of diseases classified elsewhere: Secondary | ICD-10-CM | POA: Diagnosis not present

## 2024-06-24 MED ORDER — PREDNISONE 20 MG PO TABS: 20.0000 mg | ORAL_TABLET | Freq: Every day | ORAL | 0 refills | Status: AC

## 2024-06-24 MED ORDER — DOXYCYCLINE MONOHYDRATE 100 MG PO TABS: 100.0000 mg | ORAL_TABLET | Freq: Two times a day (BID) | ORAL | 0 refills | Status: DC

## 2024-06-24 MED ORDER — AZELASTINE HCL 0.1 % NA SOLN: 1.0000 | Freq: Two times a day (BID) | NASAL | 12 refills | Status: AC

## 2024-06-24 MED ORDER — ALBUTEROL SULFATE HFA 108 (90 BASE) MCG/ACT IN AERS: 2.0000 | INHALATION_SPRAY | Freq: Four times a day (QID) | RESPIRATORY_TRACT | 0 refills | Status: AC | PRN

## 2024-06-24 MED ORDER — BENZONATATE 100 MG PO CAPS: 100.0000 mg | ORAL_CAPSULE | Freq: Two times a day (BID) | ORAL | 0 refills | Status: DC | PRN

## 2024-06-24 NOTE — Telephone Encounter (Signed)
 Apt scheduled.

## 2024-06-24 NOTE — Telephone Encounter (Signed)
 [place]  with [facility] would like 4 weeks of clinicals and any lab work faxed to [phone].

## 2024-06-24 NOTE — Progress Notes (Signed)
 Subjective:    Patient ID: Ricardo JONETTA Etha Mickey., male    DOB: Jun 25, 1941, 83 y.o.   MRN: [id number]  Chief Complaint  Patient presents with   Cough    IN CHEST    Shortness of Breath    BEEN GOING ON FOR A WHILE HAS SEEN g    PT presents to the office today worsening SOB and cough. He was seen on 06/19/24 and had a chest x-ray that showed,  Minimal bibasilar atelectasis. Minimal pleural effusions His PCP went ahead and prescribed him Omnicef 300 mg BID for 5 days.   Had a negative COVID test.  Cough This is a new problem. The current episode started 1 to 4 weeks ago. The problem has been gradually worsening. The problem occurs every few minutes. The cough is Productive of sputum. Associated symptoms include chills, rhinorrhea, shortness of breath and wheezing. Pertinent negatives include no ear congestion, ear pain, fever, headaches, nasal congestion or sore throat. He has tried rest (antibiotic) for the symptoms. The treatment provided mild relief.  Shortness of Breath Associated symptoms include rhinorrhea and wheezing. Pertinent negatives include no ear pain, fever, headaches or sore throat.      Review of Systems  Constitutional:  Positive for chills. Negative for fever.  HENT:  Positive for rhinorrhea. Negative for ear pain and sore throat.   Respiratory:  Positive for cough, shortness of breath and wheezing.   Neurological:  Negative for headaches.  All other systems reviewed and are negative.   Social History   Socioeconomic History   Marital status: Married    Spouse name: Janelle   Number of children: 3   Years of education: master's   Highest education level: Master's degree (e.g., MA, MS, MEng, MEd, MSW, MBA)  Occupational History   Occupation: press photographer    Comment: retired  Tobacco Use   Smoking status: Never   Smokeless tobacco: Never  Vaping Use   Vaping status: Never Used  Substance and Sexual Activity   Alcohol use: No   Drug use: No   Sexual  activity: Not Currently  Other Topics Concern   Not on file  Social History Narrative   Patient drinks 1 cup of coffee daily.   Patient is right handed.    Patient resides locally with his wife.  He has 3 sons, 2 sons reside in [place] where the other one resides in [place] .  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   Social Drivers of Corporate Investment Banker Strain: Low Risk  (08/20/2023)   Overall Financial Resource Strain (CARDIA)    Difficulty of Paying Living Expenses: Not very hard  Food Insecurity: No Food Insecurity (08/20/2023)   Hunger Vital Sign    Worried About Running Out of Food in the Last Year: Never true    Ran Out of Food in the Last Year: Never true  Transportation Needs: No Transportation Needs (08/20/2023)   PRAPARE - Administrator, Civil Service (Medical): No    Lack of Transportation (Non-Medical): No  Physical Activity: Sufficiently Active (08/20/2023)   Exercise Vital Sign    Days of Exercise per Week: 7 days    Minutes of Exercise per Session: 30 min  Stress: No Stress Concern Present (08/20/2023)   [employer] of Occupational Health - Occupational Stress Questionnaire  Feeling of Stress : Not at all  Social Connections: Socially Integrated (08/20/2023)   Social Connection and Isolation Panel    Frequency of Communication with Friends and Family: More than three times a week    Frequency of Social Gatherings with Friends and Family: Once a week    Attends Religious Services: More than 4 times per year    Active Member of [employer] or Organizations: Yes    Attends Engineer, Structural: More than 4 times per year    Marital Status: Married   Family History  Problem Relation Age of Onset   Breast cancer Mother    Stroke Mother    Hypertension Mother    Lung cancer Father        smoked    Ulcers Father    Heart disease Brother 38       stent   Esophageal cancer Brother    Cancer Paternal Uncle        prostate   Cancer Paternal Grandfather        prostate   Heart attack Neg Hx    Colon cancer Neg Hx    Colon polyps Neg Hx    Rectal cancer Neg Hx    Stomach cancer Neg Hx         Objective:   Physical Exam Vitals reviewed.  Constitutional:      General: He is not in acute distress.    Appearance: He is well-developed.  HENT:     Head: Normocephalic.     Right Ear: Tympanic membrane and external ear normal.     Left Ear: Tympanic membrane and external ear normal.  Eyes:     General:        Right eye: No discharge.        Left eye: No discharge.     Pupils: Pupils are equal, round, and reactive to light.  Neck:     Thyroid : No thyromegaly.  Cardiovascular:     Rate and Rhythm: Normal rate and regular rhythm.     Heart sounds: Normal heart sounds. No murmur heard. Pulmonary:     Effort: Pulmonary effort is normal. No respiratory distress.     Breath sounds: Wheezing present.  Abdominal:     General: Bowel sounds are normal. There is no distension.     Palpations: Abdomen is soft.     Tenderness: There is no abdominal tenderness.  Musculoskeletal:        General: No tenderness. Normal range of motion.     Cervical back: Normal range of motion and neck supple.  Skin:    General: Skin is warm and dry.     Findings: No erythema or rash.  Neurological:     Mental Status: He is alert and oriented to person, place, and time.     Cranial Nerves: No cranial nerve deficit.     Deep Tendon Reflexes: Reflexes are normal and symmetric.  Psychiatric:        Behavior: Behavior normal.        Thought Content: Thought content normal.        Judgment: Judgment normal.       BP (!) 171/85   Pulse 99   Temp 97.7 F (36.5 C) (Temporal)   Ht 5' 7 (1.702 m)   Wt 225 lb 9.6 oz (102.3 kg)   SpO2 99%   BMI 35.33 kg/m      Assessment & Plan:  Ricardo JONETTA Etha Mickey. comes in  today with chief complaint of Cough (IN CHEST ) and Shortness of Breath (BEEN GOING ON FOR A WHILE HAS SEEN g )   Diagnosis and orders addressed:  1. Rhinosinusitis - azelastine  (ASTELIN ) 0.1 % nasal spray; Place 1 spray into both nostrils 2 (two) times daily. For runny nose  Dispense: 30 mL; Refill: 12  2. Acute bacterial bronchitis (Primary) Continue Omnicef and start doxycycline  Start prednisone , last A1C was 5.3. Low carb diet  - Take meds as prescribed - Use a cool mist humidifier  -Use saline nose sprays frequently -Force fluids -For any cough or congestion  Use plain Mucinex- regular strength or max strength is fine -For fever or aces or pains- take tylenol  or ibuprofen. -Throat lozenges if help -Follow up if symptoms worsen or do not improve  - doxycycline  (ADOXA) 100 MG tablet; Take 1 tablet (100 mg total) by mouth 2 (two) times daily.  Dispense: 20 tablet; Refill: 0 - predniSONE  (DELTASONE ) 20 MG tablet; Take 1 tablet (20 mg total) by mouth daily with breakfast for 5 days.  Dispense: 5 tablet; Refill: 0 - albuterol  (VENTOLIN  HFA) 108 (90 Base) MCG/ACT inhaler; Inhale 2 puffs into the lungs every 6 (six) hours as needed for wheezing or shortness of breath.  Dispense: 8 g; Refill: 0 - benzonatate  (TESSALON ) 100 MG capsule; Take 1 capsule (100 mg total) by mouth 2 (two) times daily as needed for cough.  Dispense: 20 capsule; Refill: 0   Bari Learn, FNP

## 2024-06-24 NOTE — Patient Instructions (Signed)

## 2024-06-24 NOTE — Telephone Encounter (Signed)
 FYI Only or Action Required?: FYI only for provider: appointment scheduled on 06/24/24.  Patient was last seen in primary care on 06/19/2024 by [name], DO.  Called Nurse Triage reporting Cough.  Symptoms began several months ago.  Interventions attempted: Prescription medications: abx.  Symptoms are: gradually worsening.  Triage Disposition: See HCP Within 4 Hours (Or PCP Triage)  Patient/caregiver understands and will follow disposition?: Yes          Copied from CRM #[id number]. Topic: Clinical - Red Word Triage >> Jun 24, 2024  7:37 AM Ahlexyia S wrote: Red Word that prompted transfer to Nurse Triage: Pt wife Hart called in stating that pt has been wheezing and coughing more frequently even after his appointment. Pt has been seen in office but the symptoms are worsening. Hart stated that pt does feel weak but he isnt having any other symptoms at this time. Warm transferred to nurse triage. Reason for Disposition  Wheezing is present  Answer Assessment - Initial Assessment Questions 1. ONSET: When did the cough begin?      A few months - was given abx at OV last week 2. SEVERITY: How bad is the cough today?      *No Answer* 3. SPUTUM: Describe the color of your sputum (e.g., none, dry cough; clear, white, yellow, green)     Green/yellow 4. HEMOPTYSIS: Are you coughing up any blood? If Yes, ask: How much? (e.g., flecks, streaks, tablespoons, etc.)     *No Answer* 5. DIFFICULTY BREATHING: Are you having difficulty breathing? If Yes, ask: How bad is it? (e.g., mild, moderate, severe)      Mild with exertion 6. FEVER: Do you have a fever? If Yes, ask: What is your temperature, how was it measured, and when did it start?     *No Answer* 7. CARDIAC HISTORY: Do you have any history of heart disease? (e.g., heart attack, congestive heart failure)      *No Answer* 8. LUNG HISTORY: Do you have any history of lung disease?  (e.g., pulmonary  embolus, asthma, emphysema)     *No Answer* 9. PE RISK FACTORS: Do you have a history of blood clots? (or: recent major surgery, recent prolonged travel, bedridden)     *No Answer* 10. OTHER SYMPTOMS: Do you have any other symptoms? (e.g., runny nose, wheezing, chest pain)       Wheezing per wife, low energy 11. PREGNANCY: Is there any chance you are pregnant? When was your last menstrual period?       N/a 12. TRAVEL: Have you traveled out of the country in the last month? (e.g., travel history, exposures)       *No Answer*  Protocols used: Cough - Acute Productive-A-AH

## 2024-06-27 ENCOUNTER — Ambulatory Visit: Admitting: Physician Assistant

## 2024-06-27 DIAGNOSIS — I87331 Chronic venous hypertension (idiopathic) with ulcer and inflammation of right lower extremity: Secondary | ICD-10-CM | POA: Diagnosis not present

## 2024-06-27 DIAGNOSIS — I872 Venous insufficiency (chronic) (peripheral): Secondary | ICD-10-CM

## 2024-06-27 NOTE — Progress Notes (Unsigned)
 Office Visit Note   Patient: Ricardo Mayer.           Date of Birth: 09-Apr-1941           MRN: [id number] Visit Date: 06/27/2024              Requested by: [name], DO [address] [place],  [place] [address] PCP: [name], DO  No chief complaint on file.     HPI: 83 y/o male with chronic venous insufficiency he is being followed by his PCP for current wound care of the right LE lateral leg venous ulcer.  He was seen initially on 06/20/24 for slow healing venous ulcers.   He has been using compression socks in the past and elevation while sitting in a recliner.  He tends to fall asleep in the recliner during to the day.  He does sleep in a bed at night.   He states he has had increased edema in the right > let LE since he had right knee surgery in the past 10 years ago. He has a history of DVT in the right LE study done.  We discussed the key to decreased edema is elevation in the supine position above the heart.   He has been in the bed more with LE elevation.  He is recovering from pneumonia and has episodes of SOB still and can't lay down as much due to that.      Assessment & Plan: Visit Diagnoses: No diagnosis found.  Plan: ***  Follow-Up Instructions: No follow-ups on file.   Ortho Exam  Patient is alert, oriented, no adenopathy, well-dressed, normal affect, normal respiratory effort. Lateral v shaped wound with total length of 4.6 cm and width 0.7 cm.  The right leg calf measures 44 cm in circumference which is larger than the left.  Decreased edema in the right LE from 46 cm calf size.  Chronic brawny skin change BLE. With dermatis and thickened skin.  Moderate edema. No cellulitis or weeping.  Palpable DP pulses B LE.     Imaging: No results found. No images are attached to the encounter.  Labs: Lab Results  Component Value Date   HGBA1C 5.3 06/05/2024   HGBA1C 5.3 11/15/2023   HGBA1C 5.3 05/17/2023   LABURIC 6.1 08/28/2014   GRAMSTAIN  No WBC Seen 02/05/2013   GRAMSTAIN No Squamous Epithelial Cells Seen 02/05/2013   GRAMSTAIN Moderate Gram Positive Cocci In Clusters In Pairs 02/05/2013   GRAMSTAIN Moderate Yeast 02/05/2013   LABORGA Multiple Organisms Present,None Predominant 02/05/2013     Lab Results  Component Value Date   ALBUMIN 3.6 04/01/2024   ALBUMIN 3.9 03/25/2024   ALBUMIN 4.1 11/15/2023    Lab Results  Component Value Date   MG 2.0 01/10/2020   MG 2.2 10/07/2015   MG 2.0 04/15/2015   Lab Results  Component Value Date   VD25OH 33.0 07/03/2019   VD25OH 15.9 (L) 10/24/2018   VD25OH 24.0 (L) 06/07/2018    No results found for: PREALBUMIN    Latest Ref Rng & Units 04/01/2024   11:18 AM 03/25/2024    3:27 PM 05/13/2020    8:29 AM  CBC EXTENDED  WBC 4.0 - 10.5 K/uL 6.4  7.6  7.7   RBC 4.22 - 5.81 MIL/uL 3.27  3.33  4.20   Hemoglobin 13.0 - 17.0 g/dL 89.6  89.4  86.9   HCT 39.0 - 52.0 % 30.7  31.5  37.8  Platelets 150 - 400 K/uL 137  143  140   NEUT# 1.7 - 7.7 K/uL 4.6  4.9    Lymph# 0.7 - 4.0 K/uL 1.2  1.9       There is no height or weight on file to calculate BMI.  Orders:  No orders of the defined types were placed in this encounter.  No orders of the defined types were placed in this encounter.    Procedures: No procedures performed  Clinical Data: No additional findings.  ROS:  All other systems negative, except as noted in the HPI. Review of Systems  Objective: Vital Signs: There were no vitals taken for this visit.  Specialty Comments:  No specialty comments available.  PMFS History: Patient Active Problem List   Diagnosis Date Noted   Obesity (BMI 30.0-34.9) 06/05/2024   CKD (chronic kidney disease) stage 5, GFR less than 15 ml/min (HCC) 05/28/2024   Macular degeneration of both eyes 05/17/2023   Wears hearing aid in both ears 05/17/2023   History of pulmonary embolus (PE) 01/10/2020   History of deep vein thrombosis (DVT) of lower extremity 01/10/2020    History of prostate cancer 01/10/2020   Recurrent deep vein thrombosis (DVT) (HCC) 07/10/2017   Hyperlipidemia associated with type 2 diabetes mellitus (HCC) 07/13/2016   Hypertension associated with type 2 diabetes mellitus (HCC) 07/13/2016   Vitamin D  deficiency 07/13/2016   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   S/P knee replacement 03/21/2016   Anemia associated with chronic renal failure 10/28/2015   Hyperparathyroidism , secondary, non-renal 10/28/2015   Type 2 diabetes mellitus with vascular disease (HCC) 06/02/2015   Peripheral vascular insufficiency (HCC) 10/09/2013   Past Medical History:  Diagnosis Date   Adenomatous polyp of colon    Arthritis    bilateral knees   Atypical mole 2016   left shoulder sever wider shave   BCC (basal cell carcinoma of skin) 2011   left neck tx mohs   BCC (basal cell carcinoma of skin) 2015   left sideburn tx with bx   BCC (basal cell carcinoma of skin) 2015   right post shoulder tx with bx   BCC (basal cell carcinoma of skin) 2016   left chest tx with bx   BCC (basal cell carcinoma of skin) 2017   left cheek cx3 [id number]   Cataract    CKD (chronic kidney disease), stage II    Dr. 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 Georgina.PCP   History of basal cell cancer 2011   right inner eye tx mohs   History of DVT of lower extremity    2011--  LEFT LEG   History of kidney stones    History of melanoma excision    RIGHT ARM   Hyperlipidemia    Hypertension    Impaired hearing    hearing aids bilateral   Macular degeneration    Melanoma (HCC) 1990   arm and back per patient  -Melanoma   Melanoma of skin (HCC) 10/09/2013   Near syncope 08/25/2015   Dr. Ida oncefound blood clots in lung   Nocturia    Prostate cancer [facility]) 2014   Dr. Watt manuel- seed implant '14   TIA (transient ischemic attack) 04/08/2016   Ulcer of left lower leg  (HCC)    recent pcp visit dr georgina--  ordered betadine  wet/dry dsg daily (03-18-2013)  CONSULT  WITH [facility] IN 1 WEEK    Family History  Problem Relation Age of Onset   Breast cancer Mother    Stroke Mother    Hypertension Mother    Lung cancer Father        smoked   Ulcers Father    Heart disease Brother 43       stent   Esophageal cancer Brother    Cancer Paternal Uncle        prostate   Cancer Paternal Grandfather        prostate   Heart attack Neg Hx    Colon cancer Neg Hx    Colon polyps Neg Hx    Rectal cancer Neg Hx    Stomach cancer Neg Hx     Past Surgical History:  Procedure Laterality Date   CATARACT EXTRACTION W/PHACO Left 10/25/2021   Procedure: CATARACT EXTRACTION PHACO AND INTRAOCULAR LENS PLACEMENT (IOC);  Surgeon: Harrie Agent, MD;  Location: [facility];  Service: Ophthalmology;  Laterality: Left;  CDE: 24.87   CATARACT EXTRACTION W/PHACO Right 04/19/2024   Procedure: PHACOEMULSIFICATION, CATARACT, WITH IOL INSERTION;  Surgeon: Harrie Agent, MD;  Location: [facility];  Service: Ophthalmology;  Laterality: Right;  CDE: 11.49   COLONOSCOPY     EYE SURGERY     INSERTION, STENT, DRUG-ELUTING, LACRIMAL CANALICULUS Right 04/19/2024   Procedure: INSERTION, STENT, DRUG-ELUTING, LACRIMAL CANALICULUS;  Surgeon: Harrie Agent, MD;  Location: [facility];  Service: Ophthalmology;  Laterality: Right;   JOINT REPLACEMENT     KIDNEY STONE EXTRACTIONS  LAST ONE 1970   X2 OPEN/  X1  URETEROSCOPY   MELANOMA EXCISION RIGHT ARM  1990   POLYPECTOMY     PROSTATE BIOPSY     RADIOACTIVE SEED IMPLANT N/A 03/21/2013   Procedure: RADIOACTIVE SEED IMPLANT;  Surgeon: Norleen JINNY Seltzer, MD;  Location: [facility];  Service: Urology;  Laterality: N/A;  87 seeds implanted     TONSILLECTOMY     TOTAL KNEE ARTHROPLASTY Right 03/21/2016   Procedure: RIGHT TOTAL KNEE ARTHROPLASTY;  Surgeon: Donnice Car, MD;  Location: [facility];  Service: Orthopedics;  Laterality: Right;   Social  History   Occupational History   Occupation: press photographer    Comment: retired  Tobacco Use   Smoking status: Never   Smokeless tobacco: Never  Vaping Use   Vaping status: Never Used  Substance and Sexual Activity   Alcohol use: No   Drug use: No   Sexual activity: Not Currently

## 2024-06-28 ENCOUNTER — Encounter: Payer: Self-pay | Admitting: Physician Assistant

## 2024-07-10 ENCOUNTER — Other Ambulatory Visit: Payer: Self-pay | Admitting: Family Medicine

## 2024-07-10 DIAGNOSIS — E1159 Type 2 diabetes mellitus with other circulatory complications: Secondary | ICD-10-CM

## 2024-07-10 DIAGNOSIS — E1169 Type 2 diabetes mellitus with other specified complication: Secondary | ICD-10-CM

## 2024-07-16 ENCOUNTER — Encounter

## 2024-07-16 ENCOUNTER — Other Ambulatory Visit

## 2024-07-16 ENCOUNTER — Ambulatory Visit: Admitting: Physician Assistant

## 2024-07-16 ENCOUNTER — Encounter: Admitting: Vascular Surgery

## 2024-07-16 ENCOUNTER — Encounter: Payer: Self-pay | Admitting: Physician Assistant

## 2024-07-16 DIAGNOSIS — I872 Venous insufficiency (chronic) (peripheral): Secondary | ICD-10-CM | POA: Diagnosis not present

## 2024-07-16 NOTE — Progress Notes (Signed)
 Office Visit Note   Patient: Ricardo Mayer.           Date of Birth: [birth year]           MRN: [id number] Visit Date: 07/16/2024              Requested by: [name], DO [address] [place],  [place] [address] PCP: [name], DO  Chief Complaint  Patient presents with   Right Leg - Follow-up      HPI: 83 y/o male with chronic venous insufficiency he is being followed by his PCP for current wound care of the right LE lateral leg venous ulcer.  He was seen initially on 06/20/24 for slow healing venous ulcers.   He states he has had increased edema in the right > let LE since he had right knee surgery in the past 10 years ago. He has a history of DVT in the right LE study done.   He has used Vashe wet to dry with ace compression daily along with elevation.  He is here for follow up exam.    Assessment & Plan: Visit Diagnoses:  1. Venous insufficiency (chronic) (peripheral)     Plan: Compression socks daily, elevation when at rest recliner mixed with supine elevation multiple times a day.  Walk for mobility as tolerates.    Follow-Up Instructions: Return if symptoms worsen or fail to improve.   Ortho Exam  Patient is alert, oriented, no adenopathy, well-dressed, normal affect, normal respiratory effort. The wound is fully healed.  No cellulitis and minimal edema.  Chronic brawny skin change BLE. With dermatis and thickened skin. Palpable DP pulses B LE.     Imaging: No results found. No images are attached to the encounter.  Labs: Lab Results  Component Value Date   HGBA1C 5.3 06/05/2024   HGBA1C 5.3 11/15/2023   HGBA1C 5.3 05/17/2023   LABURIC 6.1 08/28/2014   GRAMSTAIN No WBC Seen 02/05/2013   GRAMSTAIN No Squamous Epithelial Cells Seen 02/05/2013   GRAMSTAIN Moderate Gram Positive Cocci In Clusters In Pairs 02/05/2013   GRAMSTAIN Moderate Yeast 02/05/2013   LABORGA Multiple Organisms Present,None Predominant 02/05/2013     Lab Results   Component Value Date   ALBUMIN 3.6 04/01/2024   ALBUMIN 3.9 03/25/2024   ALBUMIN 4.1 11/15/2023    Lab Results  Component Value Date   MG 2.0 01/10/2020   MG 2.2 10/07/2015   MG 2.0 04/15/2015   Lab Results  Component Value Date   VD25OH 33.0 07/03/2019   VD25OH 15.9 (L) 10/24/2018   VD25OH 24.0 (L) 06/07/2018    No results found for: PREALBUMIN    Latest Ref Rng & Units 04/01/2024   11:18 AM 03/25/2024    3:27 PM 05/13/2020    8:29 AM  CBC EXTENDED  WBC 4.0 - 10.5 K/uL 6.4  7.6  7.7   RBC 4.22 - 5.81 MIL/uL 3.27  3.33  4.20   Hemoglobin 13.0 - 17.0 g/dL 89.6  89.4  86.9   HCT 39.0 - 52.0 % 30.7  31.5  37.8   Platelets 150 - 400 K/uL 137  143  140   NEUT# 1.7 - 7.7 K/uL 4.6  4.9    Lymph# 0.7 - 4.0 K/uL 1.2  1.9       There is no height or weight on file to calculate BMI.  Orders:  No orders of the defined types were placed in this encounter.  No orders  of the defined types were placed in this encounter.    Procedures: No procedures performed  Clinical Data: No additional findings.  ROS:  All other systems negative, except as noted in the HPI. Review of Systems  Objective: Vital Signs: There were no vitals taken for this visit.  Specialty Comments:  No specialty comments available.  PMFS History: Patient Active Problem List   Diagnosis Date Noted   Obesity (BMI 30.0-34.9) 06/05/2024   CKD (chronic kidney disease) stage 5, GFR less than 15 ml/min (HCC) 05/28/2024   Macular degeneration of both eyes 05/17/2023   Wears hearing aid in both ears 05/17/2023   History of pulmonary embolus (PE) 01/10/2020   History of deep vein thrombosis (DVT) of lower extremity 01/10/2020   History of prostate cancer 01/10/2020   Recurrent deep vein thrombosis (DVT) (HCC) 07/10/2017   Hyperlipidemia associated with type 2 diabetes mellitus (HCC) 07/13/2016   Hypertension associated with type 2 diabetes mellitus (HCC) 07/13/2016   Vitamin D  deficiency 07/13/2016    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   S/P knee replacement 03/21/2016   Anemia associated with chronic renal failure 10/28/2015   Hyperparathyroidism , secondary, non-renal 10/28/2015   Type 2 diabetes mellitus with vascular disease (HCC) 06/02/2015   Peripheral vascular insufficiency (HCC) 10/09/2013   Past Medical History:  Diagnosis Date   Adenomatous polyp of colon    Arthritis    bilateral knees   Atypical mole 2016   left shoulder sever wider shave   BCC (basal cell carcinoma of skin) 2011   left neck tx mohs   BCC (basal cell carcinoma of skin) 2015   left sideburn tx with bx   BCC (basal cell carcinoma of skin) 2015   right post shoulder tx with bx   BCC (basal cell carcinoma of skin) 2016   left chest tx with bx   BCC (basal cell carcinoma of skin) 2017   left cheek cx3 52fu   Cataract    CKD (chronic kidney disease), stage II    Dr. 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 Georgina.PCP   History of basal cell cancer 2011   right inner eye tx mohs   History of DVT of lower extremity    2011--  LEFT LEG   History of kidney stones    History of melanoma excision    RIGHT ARM   Hyperlipidemia    Hypertension    Impaired hearing    hearing aids bilateral   Macular degeneration    Melanoma (HCC) 1990   arm and back per patient  -Melanoma   Melanoma of skin (HCC) 10/09/2013   Near syncope 08/25/2015   Dr. Ida oncefound blood clots in lung   Nocturia    Prostate cancer [facility]) 2014   Dr. Watt manuel- seed implant '14   TIA (transient ischemic attack) 04/08/2016   Ulcer of left lower leg (HCC)    recent pcp visit dr georgina--  ordered betadine  wet/dry dsg daily (03-18-2013)  CONSULT WITH [facility]OUND [facility]ARE C[facility] IN 1 WEEK    Family History  Problem Relation Age of Onset   Breast cancer Mother    Stroke Mother    Hypertension Mother    Lung cancer Father        smoked    Ulcers Father    Heart disease Brother 3       stent  Esophageal cancer Brother    Cancer Paternal Uncle        prostate   Cancer Paternal Grandfather        prostate   Heart attack Neg Hx    Colon cancer Neg Hx    Colon polyps Neg Hx    Rectal cancer Neg Hx    Stomach cancer Neg Hx     Past Surgical History:  Procedure Laterality Date   CATARACT EXTRACTION W/PHACO Left 10/25/2021   Procedure: CATARACT EXTRACTION PHACO AND INTRAOCULAR LENS PLACEMENT (IOC);  Surgeon: Harrie Agent, MD;  Location: [facility];  Service: Ophthalmology;  Laterality: Left;  CDE: 24.87   CATARACT EXTRACTION W/PHACO Right 04/19/2024   Procedure: PHACOEMULSIFICATION, CATARACT, WITH IOL INSERTION;  Surgeon: Harrie Agent, MD;  Location: [facility];  Service: Ophthalmology;  Laterality: Right;  CDE: 11.49   COLONOSCOPY     EYE SURGERY     INSERTION, STENT, DRUG-ELUTING, LACRIMAL CANALICULUS Right 04/19/2024   Procedure: INSERTION, STENT, DRUG-ELUTING, LACRIMAL CANALICULUS;  Surgeon: Harrie Agent, MD;  Location: [facility];  Service: Ophthalmology;  Laterality: Right;   JOINT REPLACEMENT     KIDNEY STONE EXTRACTIONS  LAST ONE 1970   X2 OPEN/  X1  URETEROSCOPY   MELANOMA EXCISION RIGHT ARM  1990   POLYPECTOMY     PROSTATE BIOPSY     RADIOACTIVE SEED IMPLANT N/A 03/21/2013   Procedure: RADIOACTIVE SEED IMPLANT;  Surgeon: Norleen JINNY Seltzer, MD;  Location: [facility];  Service: Urology;  Laterality: N/A;  87 seeds implanted     TONSILLECTOMY     TOTAL KNEE ARTHROPLASTY Right 03/21/2016   Procedure: RIGHT TOTAL KNEE ARTHROPLASTY;  Surgeon: Donnice Car, MD;  Location: [facility];  Service: Orthopedics;  Laterality: Right;   Social History   Occupational History   Occupation: press photographer    Comment: retired  Tobacco Use   Smoking status: Never   Smokeless tobacco: Never  Vaping Use   Vaping status: Never Used  Substance and Sexual Activity   Alcohol use: No   Drug use: No   Sexual activity: Not  Currently

## 2024-07-17 ENCOUNTER — Telehealth: Payer: Self-pay

## 2024-07-17 NOTE — Telephone Encounter (Signed)
 Received vm from [place]  @ [facility] stating they received a denial from [employer] and P2P is an option and can be set up by calling [phone]. Has to be set up  within 21 days from 11/26. [id number]. His # is [phone] if you have any questions.

## 2024-07-25 ENCOUNTER — Encounter: Payer: Self-pay | Admitting: Family Medicine

## 2024-07-25 ENCOUNTER — Ambulatory Visit

## 2024-07-25 ENCOUNTER — Ambulatory Visit: Admitting: Family Medicine

## 2024-07-25 ENCOUNTER — Ambulatory Visit: Attending: Internal Medicine

## 2024-07-25 VITALS — BP 148/65 | HR 94 | Temp 97.9°F | Ht 67.0 in | Wt 232.0 lb

## 2024-07-25 DIAGNOSIS — E8770 Fluid overload, unspecified: Secondary | ICD-10-CM

## 2024-07-25 DIAGNOSIS — R0602 Shortness of breath: Secondary | ICD-10-CM | POA: Diagnosis not present

## 2024-07-25 LAB — ECHOCARDIOGRAM COMPLETE
AR max vel: 1.58 cm2
AV Area VTI: 1.77 cm2
AV Area mean vel: 1.44 cm2
AV Mean grad: 17 mmHg
AV Peak grad: 30.4 mmHg
AV Vena cont: 0.9 cm
Ao pk vel: 2.76 m/s
Area-P 1/2: 6.6 cm2
Calc EF: 32.3 %
Height: 67 in
MV VTI: 2.8 cm2
P 1/2 time: 252 ms
S' Lateral: 4.8 cm
Single Plane A2C EF: 39.1 %
Single Plane A4C EF: 23.9 %
Weight: 3712 [oz_av]

## 2024-07-25 NOTE — Progress Notes (Signed)
 BP (!) 148/65   Pulse 94   Temp 97.9 F (36.6 C)   Ht 5' 7 (1.702 m)   Wt 232 lb (105.2 kg)   SpO2 100%   BMI 36.34 kg/m    Subjective:   Patient ID: Ricardo JONETTA Etha Mickey., male    DOB: [birth year], 83 y.o.   MRN: [id number]  HPI: Ricardo Colley. is a 83 y.o. male presenting on 07/25/2024 for Shortness of Breath (Taking 40mg  of Lasix  instead of 20) and Fatigue   Discussed the use of AI scribe software for clinical note transcription with the patient, who gave verbal consent to proceed.  History of Present Illness   Ricardo Deady. is an 83 year old male who presents with persistent breathing problems since November.  Dyspnea - Persistent breathing difficulties since November 10th - Initial episode treated with antibiotics and inhalers, which resolved 'rattling chest' but not dyspnea - No prior history of similar breathing issues  Gastrointestinal symptoms - Burping for approximately five minutes after drinking water  - No prior episodes of similar symptoms  Peripheral edema - Takes a daily diuretic - Lower extremity swelling present if feet are not elevated, consistent with baseline - No increased abdominal swelling  Weight gain - Weight gain of five to six pounds since last visit - Attributes weight gain to dietary habits, specifically increased ham consumption          Relevant past medical, surgical, family and social history reviewed and updated as indicated. Interim medical history since our last visit reviewed. Allergies and medications reviewed and updated.  Review of Systems  Constitutional:  Negative for chills and fever.  HENT:  Negative for congestion.   Eyes:  Negative for discharge.  Respiratory:  Positive for shortness of breath. Negative for cough, chest tightness and wheezing.   Cardiovascular:  Positive for leg swelling. Negative for chest pain and palpitations.  Gastrointestinal:  Negative for abdominal pain.  Skin:  Negative for rash.  All  other systems reviewed and are negative.   Per HPI unless specifically indicated above   Allergies as of 07/25/2024   No Known Allergies      Medication List        Accurate as of July 25, 2024 10:50 AM. If you have any questions, ask your nurse or doctor.          albuterol  108 (90 Base) MCG/ACT inhaler Commonly known as: VENTOLIN  HFA Inhale 2 puffs into the lungs every 6 (six) hours as needed for wheezing or shortness of breath.   amLODipine  5 MG tablet Commonly known as: NORVASC  Take 1 tablet (5 mg total) by mouth daily.   aspirin  325 MG tablet Take 1 tablet (325 mg total) by mouth daily.   atorvastatin  10 MG tablet Commonly known as: LIPITOR Take 1 tablet (10 mg total) by mouth daily.   azelastine  0.1 % nasal spray Commonly known as: ASTELIN  Place 1 spray into both nostrils 2 (two) times daily. For runny nose   benzonatate  100 MG capsule Commonly known as: TESSALON  Take 1 capsule (100 mg total) by mouth 2 (two) times daily as needed for cough.   cholecalciferol 25 MCG (1000 UNIT) tablet Commonly known as: VITAMIN D3 Take 1,000 Units by mouth daily.   doxazosin 1 MG tablet Commonly known as: CARDURA Take 1 mg by mouth at bedtime.   doxycycline  100 MG tablet Commonly known as: ADOXA Take 1 tablet (100 mg total) by mouth 2 (two) times daily.  furosemide  20 MG tablet Commonly known as: LASIX  TAKE 1 AND 1/2 TABLETS BY MOUTH  DAILY AS NEEDED FOR SWELLING   Lokelma  10 g Pack packet Generic drug: sodium zirconium cyclosilicate  Take 10 g by mouth daily.   loratadine  10 MG tablet Commonly known as: CLARITIN  Take 1 tablet EVERY OTHER day.  RENAL DOSING!!! Please reiterate this.   olmesartan  40 MG tablet Commonly known as: BENICAR  Take 1 tablet (40 mg total) by mouth daily.         Objective:   BP (!) 148/65   Pulse 94   Temp 97.9 F (36.6 C)   Ht 5' 7 (1.702 m)   Wt 232 lb (105.2 kg)   SpO2 100%   BMI 36.34 kg/m   Wt Readings from  Last 3 Encounters:  07/25/24 232 lb (105.2 kg)  06/24/24 225 lb 9.6 oz (102.3 kg)  06/19/24 227 lb 8 oz (103.2 kg)    Physical Exam Vitals and nursing note reviewed.  Constitutional:      Appearance: He is well-developed.  Pulmonary:     Breath sounds: No decreased breath sounds, wheezing, rhonchi or rales.  Neurological:     Mental Status: He is alert.    Physical Exam   CARDIOVASCULAR: Heart sounds normal       Chest x-ray shows bibasilar effusions with poor diaphragm markings especially at the coronaries.  Await final read from radiology  Assessment & Plan:   Problem List Items Addressed This Visit   None Visit Diagnoses       Shortness of breath    -  Primary   Relevant Orders   DG Chest 2 View   ECHOCARDIOGRAM COMPLETE   CMP14+EGFR   Brain natriuretic peptide     Hypervolemia, unspecified hypervolemia type       Relevant Orders   ECHOCARDIOGRAM COMPLETE   CMP14+EGFR   Brain natriuretic peptide         Chronic dyspnea with history of bi-basilar atelectasis and pleural effusions Chronic dyspnea persists post-treatment for bi-basilar atelectasis and pleural effusions. Previous treatment resolved chest rattling but not dyspnea. No wheezing or rattling noted. Heart rate normal. No significant change in peripheral edema or abdominal swelling. Weight gain noted. - Ordered repeat chest x-ray.  Gastroesophageal reflux symptoms New onset of gastroesophageal reflux symptoms with no prior history.     Patient is up on weight over 7 pounds in the past month along with persistent short of breath and x-ray showing bibasilar effusions, I am concerned that he may have developed congestive heart failure although that is not one of the diagnoses that he carries.  He already takes a diuretic as increased the dose.      Follow up plan: Return if symptoms worsen or fail to improve.  Counseling provided for all of the vaccine components Orders Placed This Encounter   Procedures   DG Chest 2 View   CMP14+EGFR   Brain natriuretic peptide   ECHOCARDIOGRAM COMPLETE    Fonda Levins, MD [facility] 07/25/2024, 10:50 AM

## 2024-07-30 ENCOUNTER — Other Ambulatory Visit: Payer: Self-pay

## 2024-07-30 ENCOUNTER — Encounter: Payer: Self-pay | Admitting: Family Medicine

## 2024-07-30 ENCOUNTER — Emergency Department (HOSPITAL_COMMUNITY)

## 2024-07-30 ENCOUNTER — Ambulatory Visit: Admitting: Family Medicine

## 2024-07-30 ENCOUNTER — Telehealth: Payer: Self-pay

## 2024-07-30 ENCOUNTER — Inpatient Hospital Stay (HOSPITAL_COMMUNITY)
Admission: EM | Admit: 2024-07-30 | Discharge: 2024-08-04 | DRG: 291 | Disposition: A | Discharge status: Home Health | Attending: Internal Medicine | Admitting: Internal Medicine

## 2024-07-30 ENCOUNTER — Encounter (HOSPITAL_COMMUNITY): Payer: Self-pay

## 2024-07-30 VITALS — BP 158/67 | HR 74 | Temp 97.8°F | Ht 67.0 in | Wt 232.0 lb

## 2024-07-30 DIAGNOSIS — I5021 Acute systolic (congestive) heart failure: Secondary | ICD-10-CM

## 2024-07-30 DIAGNOSIS — E7849 Other hyperlipidemia: Secondary | ICD-10-CM | POA: Diagnosis present

## 2024-07-30 DIAGNOSIS — H3530 Unspecified macular degeneration: Secondary | ICD-10-CM | POA: Diagnosis present

## 2024-07-30 DIAGNOSIS — T502X5A Adverse effect of carbonic-anhydrase inhibitors, benzothiadiazides and other diuretics, initial encounter: Secondary | ICD-10-CM | POA: Diagnosis not present

## 2024-07-30 DIAGNOSIS — N179 Acute kidney failure, unspecified: Secondary | ICD-10-CM | POA: Diagnosis present

## 2024-07-30 DIAGNOSIS — Z79899 Other long term (current) drug therapy: Secondary | ICD-10-CM

## 2024-07-30 DIAGNOSIS — Z8249 Family history of ischemic heart disease and other diseases of the circulatory system: Secondary | ICD-10-CM

## 2024-07-30 DIAGNOSIS — R7989 Other specified abnormal findings of blood chemistry: Secondary | ICD-10-CM | POA: Insufficient documentation

## 2024-07-30 DIAGNOSIS — E1169 Type 2 diabetes mellitus with other specified complication: Secondary | ICD-10-CM | POA: Diagnosis present

## 2024-07-30 DIAGNOSIS — Z86718 Personal history of other venous thrombosis and embolism: Secondary | ICD-10-CM

## 2024-07-30 DIAGNOSIS — E1122 Type 2 diabetes mellitus with diabetic chronic kidney disease: Secondary | ICD-10-CM | POA: Diagnosis present

## 2024-07-30 DIAGNOSIS — Z8673 Personal history of transient ischemic attack (TIA), and cerebral infarction without residual deficits: Secondary | ICD-10-CM

## 2024-07-30 DIAGNOSIS — Z96651 Presence of right artificial knee joint: Secondary | ICD-10-CM | POA: Diagnosis present

## 2024-07-30 DIAGNOSIS — Z9842 Cataract extraction status, left eye: Secondary | ICD-10-CM

## 2024-07-30 DIAGNOSIS — I152 Hypertension secondary to endocrine disorders: Secondary | ICD-10-CM

## 2024-07-30 DIAGNOSIS — I10 Essential (primary) hypertension: Secondary | ICD-10-CM | POA: Diagnosis present

## 2024-07-30 DIAGNOSIS — D509 Iron deficiency anemia, unspecified: Secondary | ICD-10-CM | POA: Diagnosis present

## 2024-07-30 DIAGNOSIS — F32A Depression, unspecified: Secondary | ICD-10-CM | POA: Diagnosis present

## 2024-07-30 DIAGNOSIS — E1159 Type 2 diabetes mellitus with other circulatory complications: Secondary | ICD-10-CM | POA: Diagnosis present

## 2024-07-30 DIAGNOSIS — M549 Dorsalgia, unspecified: Secondary | ICD-10-CM | POA: Diagnosis present

## 2024-07-30 DIAGNOSIS — Z87442 Personal history of urinary calculi: Secondary | ICD-10-CM

## 2024-07-30 DIAGNOSIS — E782 Mixed hyperlipidemia: Secondary | ICD-10-CM

## 2024-07-30 DIAGNOSIS — Z961 Presence of intraocular lens: Secondary | ICD-10-CM | POA: Diagnosis present

## 2024-07-30 DIAGNOSIS — T501X5A Adverse effect of loop [high-ceiling] diuretics, initial encounter: Secondary | ICD-10-CM | POA: Diagnosis not present

## 2024-07-30 DIAGNOSIS — Z8546 Personal history of malignant neoplasm of prostate: Secondary | ICD-10-CM

## 2024-07-30 DIAGNOSIS — N189 Chronic kidney disease, unspecified: Secondary | ICD-10-CM

## 2024-07-30 DIAGNOSIS — Z85820 Personal history of malignant melanoma of skin: Secondary | ICD-10-CM

## 2024-07-30 DIAGNOSIS — I509 Heart failure, unspecified: Principal | ICD-10-CM

## 2024-07-30 DIAGNOSIS — Z9841 Cataract extraction status, right eye: Secondary | ICD-10-CM

## 2024-07-30 DIAGNOSIS — D631 Anemia in chronic kidney disease: Secondary | ICD-10-CM | POA: Diagnosis present

## 2024-07-30 DIAGNOSIS — N185 Chronic kidney disease, stage 5: Secondary | ICD-10-CM | POA: Diagnosis present

## 2024-07-30 DIAGNOSIS — I480 Paroxysmal atrial fibrillation: Secondary | ICD-10-CM | POA: Diagnosis present

## 2024-07-30 DIAGNOSIS — R0602 Shortness of breath: Secondary | ICD-10-CM | POA: Diagnosis not present

## 2024-07-30 DIAGNOSIS — Z860101 Personal history of adenomatous and serrated colon polyps: Secondary | ICD-10-CM

## 2024-07-30 DIAGNOSIS — Z86711 Personal history of pulmonary embolism: Secondary | ICD-10-CM

## 2024-07-30 DIAGNOSIS — I2489 Other forms of acute ischemic heart disease: Secondary | ICD-10-CM | POA: Diagnosis present

## 2024-07-30 DIAGNOSIS — Z7982 Long term (current) use of aspirin: Secondary | ICD-10-CM

## 2024-07-30 DIAGNOSIS — Z6833 Body mass index (BMI) 33.0-33.9, adult: Secondary | ICD-10-CM

## 2024-07-30 DIAGNOSIS — H9190 Unspecified hearing loss, unspecified ear: Secondary | ICD-10-CM | POA: Diagnosis present

## 2024-07-30 DIAGNOSIS — E66811 Obesity, class 1: Secondary | ICD-10-CM | POA: Diagnosis present

## 2024-07-30 DIAGNOSIS — I132 Hypertensive heart and chronic kidney disease with heart failure and with stage 5 chronic kidney disease, or end stage renal disease: Principal | ICD-10-CM | POA: Diagnosis present

## 2024-07-30 DIAGNOSIS — I359 Nonrheumatic aortic valve disorder, unspecified: Secondary | ICD-10-CM | POA: Diagnosis present

## 2024-07-30 LAB — CBC
HCT: 27.5 % — ABNORMAL LOW (ref 39.0–52.0)
Hemoglobin: 8.7 g/dL — ABNORMAL LOW (ref 13.0–17.0)
MCH: 31.2 pg (ref 26.0–34.0)
MCHC: 31.6 g/dL (ref 30.0–36.0)
MCV: 98.6 fL (ref 80.0–100.0)
Platelets: 241 K/uL (ref 150–400)
RBC: 2.79 MIL/uL — ABNORMAL LOW (ref 4.22–5.81)
RDW: 15 % (ref 11.5–15.5)
WBC: 8.2 K/uL (ref 4.0–10.5)
nRBC: 0 % (ref 0.0–0.2)

## 2024-07-30 LAB — BASIC METABOLIC PANEL WITH GFR
Anion gap: 17 — ABNORMAL HIGH (ref 5–15)
BUN: 63 mg/dL — ABNORMAL HIGH (ref 8–23)
CO2: 18 mmol/L — ABNORMAL LOW (ref 22–32)
Calcium: 9.4 mg/dL (ref 8.9–10.3)
Chloride: 106 mmol/L (ref 98–111)
Creatinine, Ser: 5.07 mg/dL — ABNORMAL HIGH (ref 0.61–1.24)
GFR, Estimated: 11 mL/min — ABNORMAL LOW (ref >60)
Glucose, Bld: 89 mg/dL (ref 70–99)
Potassium: 4.7 mmol/L (ref 3.5–5.1)
Sodium: 140 mmol/L (ref 135–145)

## 2024-07-30 LAB — TROPONIN T, HIGH SENSITIVITY: Troponin T High Sensitivity: 110 ng/L (ref 0–19)

## 2024-07-30 LAB — PRO BRAIN NATRIURETIC PEPTIDE: Pro Brain Natriuretic Peptide: >35000 pg/mL — ABNORMAL HIGH (ref <300.0)

## 2024-07-30 MED ORDER — FUROSEMIDE 10 MG/ML IJ SOLN
40.0000 mg | Freq: Once | INTRAMUSCULAR | Status: AC
  Administered 2024-07-30: 23:00:00 40 mg via INTRAVENOUS
  Filled 2024-07-30: qty 4

## 2024-07-30 MED ORDER — LIDOCAINE 5 % EX PTCH
1.0000 | MEDICATED_PATCH | CUTANEOUS | Status: DC
  Administered 2024-07-30 – 2024-08-03 (×5): 1 via TRANSDERMAL
  Filled 2024-07-30 (×5): qty 1

## 2024-07-30 MED ORDER — HYDROCODONE-ACETAMINOPHEN 5-325 MG PO TABS
1.0000 | ORAL_TABLET | Freq: Once | ORAL | Status: AC
  Administered 2024-07-30: 23:00:00 1 via ORAL
  Filled 2024-07-30: qty 1

## 2024-07-30 NOTE — Progress Notes (Signed)
 Subjective: RR:dynmuwzdd of breath follow up PCP: [name], DO YEP:Mnwzb D Cornelious Bartolucci. is a 83 y.o. male presenting to clinic today for:  Patient was seen last week by my partner for increasing shortness of breath.  He was concerned about possible heart failure so ordered labs, which were not obtained during that visit.  Patient did not realize he had labs to get drawn.  He did have a chest x-ray which has not been formally read but upon my personal review demonstrates fluid in the costophrenic angles.  Additionally, had echocardiogram which shows reduced ejection fraction of 30%.  This is drastically different than his 2016 echocardiogram which showed normal range EF.  Medical history significant for CKD 4.  He reports dyspnea on exertion, orthopnea, lower extremity edema   ROS: Per HPI  Allergies[1] Past Medical History:  Diagnosis Date   Adenomatous polyp of colon    Arthritis    bilateral knees   Atypical mole 2016   left shoulder sever wider shave   BCC (basal cell carcinoma of skin) 2011   left neck tx mohs   BCC (basal cell carcinoma of skin) 2015   left sideburn tx with bx   BCC (basal cell carcinoma of skin) 2015   right post shoulder tx with bx   BCC (basal cell carcinoma of skin) 2016   left chest tx with bx   BCC (basal cell carcinoma of skin) 2017   left cheek cx3 41fu   Cataract    CKD (chronic kidney disease), stage II    Dr. 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 Georgina.PCP   History of basal cell cancer 2011   right inner eye tx mohs   History of DVT of lower extremity    2011--  LEFT LEG   History of kidney stones    History of melanoma excision    RIGHT ARM   Hyperlipidemia    Hypertension    Impaired hearing    hearing aids bilateral   Macular degeneration    Melanoma (HCC) 1990   arm and back per patient  -Melanoma   Melanoma of skin  (HCC) 10/09/2013   Near syncope 08/25/2015   Dr. Ida oncefound blood clots in lung   Nocturia    Prostate cancer [facility]) 2014   Dr. Watt manuel- seed implant '14   TIA (transient ischemic attack) 04/08/2016   Ulcer of left lower leg (HCC)    recent pcp visit dr georgina--  ordered betadine  wet/dry dsg daily (03-18-2013)  CONSULT WITH [facility] IN 1 WEEK   Current Medications[2] Social History   Socioeconomic History   Marital status: Married    Spouse name: Janelle   Number of children: 3   Years of education: master's   Highest education level: Master's degree (e.g., MA, MS, MEng, MEd, MSW, MBA)  Occupational History   Occupation: press photographer    Comment: retired  Tobacco Use   Smoking status: Never   Smokeless tobacco: Never  Vaping Use   Vaping status: Never Used  Substance and Sexual Activity   Alcohol use: No   Drug use: No   Sexual activity: Not Currently  Other Topics Concern   Not on file  Social History Narrative   Patient drinks 1 cup of coffee daily.   Patient is right handed.    Patient resides locally  with his wife.  He has 3 sons, 2 sons reside in [place] where the other one resides in [facility] [place] .  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   Social Drivers of Health   Tobacco Use: Low Risk (07/30/2024)   Patient History    Smoking Tobacco Use: Never    Smokeless Tobacco Use: Never    Passive Exposure: Not on file  Financial Resource Strain: Low Risk (08/20/2023)   Overall Financial Resource Strain (CARDIA)    Difficulty of Paying Living Expenses: Not very hard  Food Insecurity: No Food Insecurity (08/20/2023)   Hunger Vital Sign    Worried About Running Out of Food in the Last Year: Never true    Ran Out of Food in the Last Year: Never true  Transportation Needs: No Transportation Needs  (08/20/2023)   PRAPARE - Administrator, Civil Service (Medical): No    Lack of Transportation (Non-Medical): No  Physical Activity: Sufficiently Active (08/20/2023)   Exercise Vital Sign    Days of Exercise per Week: 7 days    Minutes of Exercise per Session: 30 min  Stress: No Stress Concern Present (08/20/2023)   [employer] of Occupational Health - Occupational Stress Questionnaire    Feeling of Stress : Not at all  Social Connections: Socially Integrated (08/20/2023)   Social Connection and Isolation Panel    Frequency of Communication with Friends and Family: More than three times a week    Frequency of Social Gatherings with Friends and Family: Once a week    Attends Religious Services: More than 4 times per year    Active Member of Clubs or Organizations: Yes    Attends Banker Meetings: More than 4 times per year    Marital Status: Married  Catering Manager Violence: Not At Risk (08/21/2023)   Humiliation, Afraid, Rape, and Kick questionnaire    Fear of Current or Ex-Partner: No    Emotionally Abused: No    Physically Abused: No    Sexually Abused: No  Depression (PHQ2-9): Low Risk (07/25/2024)   Depression (PHQ2-9)    PHQ-2 Score: 0  Recent Concern: Depression (PHQ2-9) - Medium Risk (04/26/2024)   Depression (PHQ2-9)    PHQ-2 Score: 5  Alcohol Screen: Low Risk (08/20/2023)   Alcohol Screen    Last Alcohol Screening Score (AUDIT): 1  Housing: Low Risk (08/20/2023)   Housing Stability Vital Sign    Unable to Pay for Housing in the Last Year: No    Number of Times Moved in the Last Year: 0    Homeless in the Last Year: No  Utilities: Not At Risk (08/21/2023)   AHC Utilities    Threatened with loss of utilities: No  Health Literacy: Adequate Health Literacy (08/21/2023)   B1300 Health Literacy    Frequency of need for help with medical instructions: Never   Family History  Problem Relation Age of Onset   Breast cancer Mother    Stroke Mother     Hypertension Mother    Lung cancer Father        smoked   Ulcers Father    Heart disease Brother 86       stent   Esophageal cancer Brother    Cancer Paternal Uncle        prostate   Cancer Paternal Grandfather  prostate   Heart attack Neg Hx    Colon cancer Neg Hx    Colon polyps Neg Hx    Rectal cancer Neg Hx    Stomach cancer Neg Hx     Objective: Office vital signs reviewed. BP (!) 158/67   Pulse 74   Temp 97.8 F (36.6 C)   Ht 5' 7 (1.702 m)   Wt 232 lb (105.2 kg)   SpO2 98%   BMI 36.34 kg/m   Physical Examination:  General: Awake, alert, ill-appearing, No acute distress HEENT: Sclera slightly injected Cardio: regular rate and rhythm, S1S2 heard, no murmurs appreciated Pulm: Bibasilar rales appreciated.  Dyspnea on exertion observed. Extremities: Edema present with stasis changes of the skin MSK: Ambulating with use of cane     07/25/2024    9:46 AM 06/05/2024    9:29 AM 05/03/2024    3:50 PM  Depression screen PHQ 2/9  Decreased Interest 0 0 0  Down, Depressed, Hopeless 0 0 0  PHQ - 2 Score 0 0 0  Altered sleeping  2 1  Tired, decreased energy  0 0  Change in appetite  0 0  Feeling bad or failure about yourself   0 0  Trouble concentrating  0 0  Moving slowly or fidgety/restless  0 0  Suicidal thoughts  0 0  PHQ-9 Score  2  1   Difficult doing work/chores  Not difficult at all Not difficult at all     Data saved with a previous flowsheet row definition      05/03/2024    3:51 PM 04/26/2024    1:59 PM 04/18/2024    8:43 AM 11/15/2023    9:08 AM  GAD 7 : Generalized Anxiety Score  Nervous, Anxious, on Edge 0 0 0 0  Control/stop worrying 0 0 0 0  Worry too much - different things 0 0 0 0  Trouble relaxing 0 0 0 0  Restless 0 0 0 0  Easily annoyed or irritable 0 0 0 0  Afraid - awful might happen 0 0 0 0  Total GAD 7 Score 0 0 0 0  Anxiety Difficulty Not difficult at all Not difficult at all Not difficult at all Not difficult at all        Assessment/ Plan: 83 y.o. male   Acute systolic heart failure (HCC) - Plan: Ambulatory referral to Cardiology   I personally reviewed his chest x-ray which demonstrated blunting of the costophrenic angles consistent with fluid.  Sadly, he did not get BNP or CMP done prior to discharge last week so uncertain if this is cardiorenal mediated or new onset CHF in the setting of maybe a silent heart attack.  However, EF noted to be in the 30th percentile which is new for the patient.  I highly recommended that he seek emergent medical attention in the ER at [facility] for admission as he will certainly need extensive workup and very close monitoring of diuresis given CKD 4.  I voiced my concerns to his wife.  He will be brought by private vehicle by his son and we will contact the ER triage nurse so that they can anticipate his impending arrival   Tarron Krolak M Hollister Wessler, DO [facility] [phone]     [1] No Known Allergies [2]  Current Outpatient Medications:    albuterol  (VENTOLIN  HFA) 108 (90 Base) MCG/ACT inhaler, Inhale 2 puffs into the lungs every 6 (six) hours as needed for wheezing or shortness  of breath., Disp: 8 g, Rfl: 0   amLODipine  (NORVASC ) 5 MG tablet, Take 1 tablet (5 mg total) by mouth daily., Disp: 100 tablet, Rfl: 3   aspirin  325 MG tablet, Take 1 tablet (325 mg total) by mouth daily., Disp: 90 tablet, Rfl: 3   atorvastatin  (LIPITOR) 10 MG tablet, Take 1 tablet (10 mg total) by mouth daily., Disp: 100 tablet, Rfl: 3   azelastine  (ASTELIN ) 0.1 % nasal spray, Place 1 spray into both nostrils 2 (two) times daily. For runny nose, Disp: 30 mL, Rfl: 12   benzonatate  (TESSALON ) 100 MG capsule, Take 1 capsule (100 mg total) by mouth 2 (two) times daily as needed for cough., Disp: 20 capsule, Rfl: 0   cholecalciferol (VITAMIN D3) 25 MCG (1000 UT) tablet, Take 1,000 Units by mouth daily., Disp: , Rfl:    doxazosin  (CARDURA ) 1 MG tablet, Take 1 mg by mouth at  bedtime., Disp: , Rfl:    doxycycline  (ADOXA) 100 MG tablet, Take 1 tablet (100 mg total) by mouth 2 (two) times daily., Disp: 20 tablet, Rfl: 0   furosemide  (LASIX ) 20 MG tablet, TAKE 1 AND 1/2 TABLETS BY MOUTH  DAILY AS NEEDED FOR SWELLING, Disp: 150 tablet, Rfl: 0   loratadine  (CLARITIN ) 10 MG tablet, Take 1 tablet EVERY OTHER day.  RENAL DOSING!!! Please reiterate this., Disp: 30 tablet, Rfl: 11   olmesartan  (BENICAR ) 40 MG tablet, Take 1 tablet (40 mg total) by mouth daily., Disp: 100 tablet, Rfl: 3   sodium zirconium cyclosilicate  (LOKELMA ) 10 g PACK packet, Take 10 g by mouth daily., Disp: 1 packet, Rfl: 0

## 2024-07-30 NOTE — ED Notes (Signed)
 Pt endorses bilateral leg swelling  Pt gives verbal consent for mse

## 2024-07-30 NOTE — ED Provider Notes (Signed)
 [place] EMERGENCY DEPARTMENT AT [facility] Provider Note   CSN: [id number] Arrival date & time: 07/30/24  1706     Patient presents with: Shortness of Breath   Rhylan Kagel. is a 83 y.o. male.  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  Says that since November he has had worsening lower extremity swelling and shortness of breath.  Has been compliant with his Lasix .  Had an echo on 12/11 that showed an EF of approximately 30%.  Went to his primary care doctor's office who referred him into the emergency department for evaluation today.  Denies any chest pain.  No fevers or recent illnesses.  No formal history of CHF that he is aware of       Prior to Admission medications  Medication Sig Start Date End Date Taking? Authorizing Provider  albuterol  (VENTOLIN  HFA) 108 (90 Base) MCG/ACT inhaler Inhale 2 puffs into the lungs every 6 (six) hours as needed for wheezing or shortness of breath. 06/24/24   Lavell Lye A, FNP  amLODipine  (NORVASC ) 5 MG tablet Take 1 tablet (5 mg total) by mouth daily. 06/05/24   [name], DO  aspirin  325 MG tablet Take 1 tablet (325 mg total) by mouth daily. 05/13/20   [name], DO  atorvastatin  (LIPITOR) 10 MG tablet Take 1 tablet (10 mg total) by mouth daily. 06/05/24   [name], DO  azelastine  (ASTELIN ) 0.1 % nasal spray Place 1 spray into both nostrils 2 (two) times daily. For runny nose 06/24/24   Lavell Lye A, FNP  benzonatate  (TESSALON ) 100 MG capsule Take 1 capsule (100 mg total) by mouth 2 (two) times daily as needed for cough. 06/24/24   Lavell Lye A, FNP  cholecalciferol (VITAMIN D3) 25 MCG (1000 UT) tablet Take 1,000 Units by mouth daily.    [provider]  doxazosin   (CARDURA ) 1 MG tablet Take 1 mg by mouth at bedtime. 12/02/22   [provider]  doxycycline  (ADOXA) 100 MG tablet Take 1 tablet (100 mg total) by mouth 2 (two) times daily. 06/24/24   Lavell Lye A, FNP  furosemide  (LASIX ) 20 MG tablet TAKE 1 AND 1/2 TABLETS BY MOUTH  DAILY AS NEEDED FOR SWELLING 05/08/24   Jolinda Norene M, DO  loratadine  (CLARITIN ) 10 MG tablet Take 1 tablet EVERY OTHER day.  RENAL DOSING!!! Please reiterate this. 11/07/22   [name], DO  olmesartan  (BENICAR ) 40 MG tablet Take 1 tablet (40 mg total) by mouth daily. 06/05/24   [name], DO  sodium zirconium cyclosilicate  (LOKELMA ) 10 g PACK packet Take 10 g by mouth daily. 03/25/24   Patsey Lot, MD    Allergies: Patient has no known allergies.    Review of Systems  Updated Vital Signs BP (!) 162/74 (BP Location: Right Arm)   Pulse 94   Temp 98.7 F (37.1 C) (Oral)   Resp (!) 25   Ht 5' 7 (1.702 m)   Wt 104.3 kg   SpO2 98%   BMI 36.02 kg/m   Physical Exam Vitals and nursing note reviewed.  Constitutional:      General: He is not in acute distress.    Appearance: He is well-developed.  HENT:     Head: Normocephalic and atraumatic.     Right Ear: External  ear normal.     Left Ear: External ear normal.     Nose: Nose normal.  Eyes:     Extraocular Movements: Extraocular movements intact.     Conjunctiva/sclera: Conjunctivae normal.     Pupils: Pupils are equal, round, and reactive to light.  Cardiovascular:     Rate and Rhythm: Normal rate and regular rhythm.     Heart sounds: Normal heart sounds.  Pulmonary:     Effort: Pulmonary effort is normal. No respiratory distress.     Breath sounds: Rales present.     Comments: Diminished in the right base. Abdominal:     General: There is no distension.     Palpations: Abdomen is soft. There is no mass.     Tenderness: There is no abdominal tenderness. There is no guarding.  Musculoskeletal:     Cervical back: Normal  range of motion and neck supple.     Right lower leg: Edema present.     Left lower leg: Edema present.  Skin:    General: Skin is warm and dry.  Neurological:     Mental Status: He is alert. Mental status is at baseline.  Psychiatric:        Mood and Affect: Mood normal.        Behavior: Behavior normal.     (all labs ordered are listed, but only abnormal results are displayed) Labs Reviewed  BASIC METABOLIC PANEL WITH GFR - Abnormal; Notable for the following components:      Result Value   CO2 18 (*)    BUN 63 (*)    Creatinine, Ser 5.07 (*)    GFR, Estimated 11 (*)    Anion gap 17 (*)    All other components within normal limits  CBC - Abnormal; Notable for the following components:   RBC 2.79 (*)    Hemoglobin 8.7 (*)    HCT 27.5 (*)    All other components within normal limits  PRO BRAIN NATRIURETIC PEPTIDE - Abnormal; Notable for the following components:   Pro Brain Natriuretic Peptide >35,000.0 (*)    All other components within normal limits  TROPONIN T, HIGH SENSITIVITY - Abnormal; Notable for the following components:   Troponin T High Sensitivity 110 (*)    All other components within normal limits    EKG: EKG Interpretation Date/Time:  Tuesday July 30 2024 17:30:05 EST Ventricular Rate:  94 PR Interval:    QRS Duration:  136 QT Interval:  374 QTC Calculation: 467 R Axis:   -12  Text Interpretation: Wide QRS rhythm Non-specific intra-ventricular conduction block Minimal voltage criteria for LVH, may be normal variant ( Cornell product ) Cannot rule out Anteroseptal infarct , age undetermined Abnormal ECG Confirmed by Yolande Charleston [phone]) on 07/30/2024 9:05:01 PM  Radiology: DG Chest 2 View Result Date: 07/30/2024 EXAM: 2 VIEW(S) XRAY OF THE CHEST 07/30/2024 06:25:00 PM COMPARISON: Comparison with 07/25/2024. CLINICAL HISTORY: shortness of breath FINDINGS: LUNGS AND PLEURA: No focal pulmonary opacity. No vascular congestion or edema. Small  bilateral pleural effusions with basilar atelectasis, greater on the right. Similar appearance to prior study. No pneumothorax. HEART AND MEDIASTINUM: Mild cardiac enlargement. Mediastinal contours appear intact. BONES AND SOFT TISSUES: Degenerative changes in the spine and shoulders. IMPRESSION: 1. Small bilateral pleural effusions with basilar atelectasis, greater on the right, similar to prior study. 2. Mild cardiac enlargement without vascular congestion or edema. Electronically signed by: Elsie Gravely MD 07/30/2024 06:37 PM EST RP Workstation: [id number]    {  Document cardiac monitor, telemetry assessment procedure when appropriate:32947} Procedures   Medications Ordered in the ED - No data to display  Clinical Course as of 07/30/24 2248  Tue Jul 30, 2024  2140 Hemoglobin(!): 8.7 Baseline 10 [RP]  2222 Dr Gail from cardiology consulted regarding the patient's wide-complex rhythm and new onset heart failure [RP]  2233 Repeat EKG shows sinus rhythm.  [RP]    Clinical Course User Index [RP] Yolande Lamar BROCKS, MD   {Click here for ABCD2, HEART and other calculators REFRESH Note before signing:1}                              Medical Decision Making Amount and/or Complexity of Data Reviewed Labs:  Decision-making details documented in ED Course.  Risk Prescription drug management. Decision regarding hospitalization.   ***  {Document critical care time when appropriate  Document review of labs and clinical decision tools ie CHADS2VASC2, etc  Document your independent review of radiology images and any outside records  Document your discussion with family members, caretakers and with consultants  Document social determinants of health affecting pt's care  Document your decision making why or why not admission, treatments were needed:32947:::1}   Final diagnoses:  None    ED Discharge Orders     None

## 2024-07-30 NOTE — Consult Note (Incomplete)
 Cardiology Consultation   Patient ID: Ricardo Mayer. MRN: [id number]; DOB: October 15, 1940  Admit date: 07/30/2024 Date of Consult: 07/30/2024  PCP:  [name], DO    HeartCare Providers Cardiologist:  None   { Click here to update MD or APP on Care Team, Refresh:1}     Patient Profile: Ricardo Loughmiller. is a 83 y.o. male with a hx of *** who is being seen 07/30/2024 for the evaluation of *** at the request of ***.  History of Present Illness: Mr. Haffey ***   Shortness of breath since October when was diagnosed    Past Medical History:  Diagnosis Date   Adenomatous polyp of colon    Arthritis    bilateral knees   Atypical mole 2016   left shoulder sever wider shave   BCC (basal cell carcinoma of skin) 2011   left neck tx mohs   BCC (basal cell carcinoma of skin) 2015   left sideburn tx with bx   BCC (basal cell carcinoma of skin) 2015   right post shoulder tx with bx   BCC (basal cell carcinoma of skin) 2016   left chest tx with bx   BCC (basal cell carcinoma of skin) 2017   left cheek cx3 41fu   Cataract    CKD (chronic kidney disease), stage II    Dr. 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 Georgina.PCP   History of basal cell cancer 2011   right inner eye tx mohs   History of DVT of lower extremity    2011--  LEFT LEG   History of kidney stones    History of melanoma excision    RIGHT ARM   Hyperlipidemia    Hypertension    Impaired hearing    hearing aids bilateral   Macular degeneration    Melanoma (HCC) 1990   arm and back per patient  -Melanoma   Melanoma of skin (HCC) 10/09/2013   Near syncope 08/25/2015   Dr. Ida oncefound blood clots in lung   Nocturia    Prostate cancer [facility]) 2014   Dr. Watt manuel- seed implant '14   TIA (transient ischemic attack) 04/08/2016   Ulcer of left lower leg (HCC)    recent  pcp visit dr georgina--  ordered betadine  wet/dry dsg daily (03-18-2013)  CONSULT WITH [facility] IN 1 WEEK    Past Surgical History:  Procedure Laterality Date   CATARACT EXTRACTION W/PHACO Left 10/25/2021   Procedure: CATARACT EXTRACTION PHACO AND INTRAOCULAR LENS PLACEMENT (IOC);  Surgeon: Harrie Agent, MD;  Location: [facility];  Service: Ophthalmology;  Laterality: Left;  CDE: 24.87   CATARACT EXTRACTION W/PHACO Right 04/19/2024   Procedure: PHACOEMULSIFICATION, CATARACT, WITH IOL INSERTION;  Surgeon: Harrie Agent, MD;  Location: [facility];  Service: Ophthalmology;  Laterality: Right;  CDE: 11.49   COLONOSCOPY     EYE SURGERY     INSERTION, STENT, DRUG-ELUTING, LACRIMAL CANALICULUS Right 04/19/2024   Procedure: INSERTION, STENT, DRUG-ELUTING, LACRIMAL CANALICULUS;  Surgeon: Harrie Agent, MD;  Location: [facility];  Service: Ophthalmology;  Laterality: Right;   JOINT REPLACEMENT     KIDNEY STONE EXTRACTIONS  LAST ONE 1970   X2 OPEN/  X1  URETEROSCOPY   MELANOMA EXCISION RIGHT ARM  1990   POLYPECTOMY     PROSTATE BIOPSY     RADIOACTIVE SEED IMPLANT N/A 03/21/2013  Procedure: RADIOACTIVE SEED IMPLANT;  Surgeon: Norleen JINNY Seltzer, MD;  Location: [facility];  Service: Urology;  Laterality: N/A;  87 seeds implanted     TONSILLECTOMY     TOTAL KNEE ARTHROPLASTY Right 03/21/2016   Procedure: RIGHT TOTAL KNEE ARTHROPLASTY;  Surgeon: Donnice Car, MD;  Location: [facility];  Service: Orthopedics;  Laterality: Right;     {Home Medications (Optional):21181}  Scheduled Meds:  lidocaine   1 patch Transdermal Q24H   Continuous Infusions:  PRN Meds:   Allergies:   Allergies[1]  Social History:   Social History   Socioeconomic History   Marital status: Married    Spouse name: Janelle   Number of children: 3   Years of education: master's   Highest education level: Master's degree (e.g., MA, MS, MEng, MEd, MSW, MBA)  Occupational History   Occupation: press photographer     Comment: retired  Tobacco Use   Smoking status: Never   Smokeless tobacco: Never  Vaping Use   Vaping status: Never Used  Substance and Sexual Activity   Alcohol use: No   Drug use: No   Sexual activity: Not Currently  Other Topics Concern   Not on file  Social History Narrative   Patient drinks 1 cup of coffee daily.   Patient is right handed.    Patient resides locally with his wife.  He has 3 sons, 2 sons reside in [place] where the other one resides in [facility] [place] .  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   Social Drivers of Health   Tobacco Use: Low Risk (07/30/2024)   Patient History    Smoking Tobacco Use: Never    Smokeless Tobacco Use: Never    Passive Exposure: Not on file  Financial Resource Strain: Low Risk (08/20/2023)   Overall Financial Resource Strain (CARDIA)    Difficulty of Paying Living Expenses: Not very hard  Food Insecurity: No Food Insecurity (08/20/2023)   Hunger Vital Sign    Worried About Running Out of Food in the Last Year: Never true    Ran Out of Food in the Last Year: Never true  Transportation Needs: No Transportation Needs (08/20/2023)   PRAPARE - Administrator, Civil Service (Medical): No    Lack of Transportation (Non-Medical): No  Physical Activity: Sufficiently Active (08/20/2023)   Exercise Vital Sign    Days of Exercise per Week: 7 days    Minutes of Exercise per Session: 30 min  Stress: No Stress Concern Present (08/20/2023)   [employer] of Occupational Health - Occupational Stress Questionnaire    Feeling of Stress : Not at all  Social Connections: Socially Integrated (08/20/2023)   Social Connection and Isolation Panel    Frequency of Communication with Friends and Family: More than three times a week    Frequency of Social Gatherings with Friends and Family: Once a week     Attends Religious Services: More than 4 times per year    Active Member of [employer] or Organizations: Yes    Attends Banker Meetings: More than 4 times per year    Marital Status: Married  Catering Manager Violence: Not At Risk (08/21/2023)   Humiliation, Afraid, Rape, and Kick questionnaire    Fear of Current or Ex-Partner: No    Emotionally Abused: No    Physically Abused: No  Sexually Abused: No  Depression (PHQ2-9): High Risk (07/30/2024)   Depression (PHQ2-9)    PHQ-2 Score: 11  Alcohol Screen: Low Risk (08/20/2023)   Alcohol Screen    Last Alcohol Screening Score (AUDIT): 1  Housing: Low Risk (08/20/2023)   Housing Stability Vital Sign    Unable to Pay for Housing in the Last Year: No    Number of Times Moved in the Last Year: 0    Homeless in the Last Year: No  Utilities: Not At Risk (08/21/2023)   AHC Utilities    Threatened with loss of utilities: No  Health Literacy: Adequate Health Literacy (08/21/2023)   B1300 Health Literacy    Frequency of need for help with medical instructions: Never    Family History:   *** Family History  Problem Relation Age of Onset   Breast cancer Mother    Stroke Mother    Hypertension Mother    Lung cancer Father        smoked   Ulcers Father    Heart disease Brother 84       stent   Esophageal cancer Brother    Cancer Paternal Uncle        prostate   Cancer Paternal Grandfather        prostate   Heart attack Neg Hx    Colon cancer Neg Hx    Colon polyps Neg Hx    Rectal cancer Neg Hx    Stomach cancer Neg Hx      ROS:  Please see the history of present illness.  *** All other ROS reviewed and negative.     Physical Exam/Data: Vitals:   07/30/24 1714 07/30/24 2116 07/30/24 2117  BP: (!) 143/69  (!) 162/74  Pulse: 94  94  Resp: (!) 24  (!) 25  Temp: (!) 97.5 F (36.4 C)  98.7 F (37.1 C)  TempSrc: Oral  Oral  SpO2: 98%  98%  Weight:  104.3 kg   Height:  5' 7 (1.702 m)    No intake or output data in the  24 hours ending 07/30/24 2255    07/30/2024    9:16 PM 07/30/2024    2:44 PM 07/25/2024    9:43 AM  Last 3 Weights  Weight (lbs) 230 lb 232 lb 232 lb  Weight (kg) 104.327 kg 105.235 kg 105.235 kg     Body mass index is 36.02 kg/m.  General:  Well nourished, well developed, in no acute distress*** HEENT: normal Neck: no JVD Vascular: No carotid bruits; Distal pulses 2+ bilaterally Cardiac:  normal S1, S2; RRR; no murmur *** Lungs:  clear to auscultation bilaterally, no wheezing, rhonchi or rales  Abd: soft, nontender, no hepatomegaly  Ext: no edema Musculoskeletal:  No deformities, BUE and BLE strength normal and equal Skin: warm and dry  Neuro:  CNs 2-12 intact, no focal abnormalities noted Psych:  Normal affect   EKG:  The EKG was personally reviewed and demonstrates:  *** Telemetry:  Telemetry was personally reviewed and demonstrates:  ***  Relevant CV Studies: ***  Laboratory Data: High Sensitivity Troponin:  No results for input(s): TROPONINIHS in the last 720 hours.  Recent Labs  Lab 07/30/24 1800  TRNPT 110*      Chemistry Recent Labs  Lab 07/30/24 1800  NA 140  K 4.7  CL 106  CO2 18*  GLUCOSE 89  BUN 63*  CREATININE 5.07*  CALCIUM  9.4  GFRNONAA 11*  ANIONGAP 17*    No results  for input(s): PROT, ALBUMIN, AST, ALT, ALKPHOS, BILITOT in the last 168 hours. Lipids No results for input(s): CHOL, TRIG, HDL, LABVLDL, LDLCALC, CHOLHDL in the last 168 hours.  Hematology Recent Labs  Lab 07/30/24 1800  WBC 8.2  RBC 2.79*  HGB 8.7*  HCT 27.5*  MCV 98.6  MCH 31.2  MCHC 31.6  RDW 15.0  PLT 241   Thyroid  No results for input(s): TSH, FREET4 in the last 168 hours.  BNP Recent Labs  Lab 07/30/24 1800  PROBNP >35,000.0*    DDimer No results for input(s): DDIMER in the last 168 hours.  Radiology/Studies:  DG Chest 2 View Result Date: 07/30/2024 EXAM: 2 VIEW(S) XRAY OF THE CHEST 07/30/2024 06:25:00 PM COMPARISON:  Comparison with 07/25/2024. CLINICAL HISTORY: shortness of breath FINDINGS: LUNGS AND PLEURA: No focal pulmonary opacity. No vascular congestion or edema. Small bilateral pleural effusions with basilar atelectasis, greater on the right. Similar appearance to prior study. No pneumothorax. HEART AND MEDIASTINUM: Mild cardiac enlargement. Mediastinal contours appear intact. BONES AND SOFT TISSUES: Degenerative changes in the spine and shoulders. IMPRESSION: 1. Small bilateral pleural effusions with basilar atelectasis, greater on the right, similar to prior study. 2. Mild cardiac enlargement without vascular congestion or edema. Electronically signed by: Elsie Gravely MD 07/30/2024 06:37 PM EST RP Workstation: [id number]     Assessment and Plan: ***   Risk Assessment/Risk Scores: {Complete the following score calculators/questions to meet required metrics.  Press F2         :[id number]}      [place]  Heart Association (NYHA) Functional Class {Yes            :[id number]} {Does this patient have ATRIAL FIBRILLATION?[phone]}  {Are we signing off today?:[id number]}  For questions or updates, please contact [facility] Please consult [email or web address] for contact info under    {TIP  Split Shared Billing  Do NOT delete any part of this including brackets If split shared billing is based upon MDM, disregard If billing will be based upon TIME you MUST document the number of minutes and a detailed list of what was done in that time in the following format Example - I spent ** minutes seeing this patient. During that time I reviewed their history, evaluated their symptoms, reviewed available labs, EKGs, studies, performed an exam and formulated an assessment and plan   :1} {Select this only if you need to document critical care time (Optional):[id number]} Signed, Laurene DELENA Pacini, MD  07/30/2024 10:55 PM      [1] No Known Allergies

## 2024-07-30 NOTE — Telephone Encounter (Signed)
 Reached out to triage nurse in the emergency department at [facility] emergency department to advise that Dr. Jolinda is sending Ricardo Mayer to the emergency department in a private vehicle. After reviewing his recent chest x-ray and echocardiogram it appears the patient is in acute heart failure. Advised triage nurse that Ricardo Mayer is needing immediate admission, Nurse stated that as of right now there is a 9 hour wait but she would write patients information down. Message was sent to patients cardiologist to try to get admission.

## 2024-07-30 NOTE — ED Triage Notes (Signed)
 Quick triage note: Pt to ED from PCP c/o [facility], sent here from PCP for concern of acute heart failure, pt denies CP.

## 2024-07-30 NOTE — ED Provider Triage Note (Signed)
 Emergency Medicine Provider Triage Evaluation Note  Ricardo Mayer. , a 83 y.o. male  was evaluated in triage.  Pt complains of shortness of breath, lower extremity edema.  Patient was seen by his PCP today, where they followed up on the results of a recent echocardiogram that showed a reduced ejection fraction of 30%, thus concerning for acute onset of heart failure.  Patient notes that he has been experiencing shortness of breath at all times for approximately 1 month, he also notes worsening lower extremity edema despite compliance with Lasix .  Denies chest pain.  Review of Systems  Positive: As above Negative: As above  Physical Exam  BP (!) 143/69   Pulse 94   Temp (!) 97.5 F (36.4 C) (Oral)   Resp (!) 24   SpO2 98%  Gen:   Awake, no distress   Resp:  Normal effort  MSK:   Moves extremities without difficulty  Other:  2+ pitting edema of bilateral lower extremities  Medical Decision Making  Medically screening exam initiated at 5:56 PM.  Appropriate orders placed.  Slade D Blankenbeckler Jr. was informed that the remainder of the evaluation will be completed by another provider, this initial triage assessment does not replace that evaluation, and the importance of remaining in the ED until their evaluation is complete.     Glendia Rocky SAILOR, [place] 07/30/24 1757

## 2024-07-31 ENCOUNTER — Inpatient Hospital Stay (HOSPITAL_COMMUNITY)

## 2024-07-31 ENCOUNTER — Encounter (HOSPITAL_COMMUNITY): Payer: Self-pay | Admitting: Internal Medicine

## 2024-07-31 ENCOUNTER — Ambulatory Visit: Payer: Self-pay | Admitting: Family Medicine

## 2024-07-31 DIAGNOSIS — Z7982 Long term (current) use of aspirin: Secondary | ICD-10-CM | POA: Diagnosis not present

## 2024-07-31 DIAGNOSIS — R7989 Other specified abnormal findings of blood chemistry: Secondary | ICD-10-CM | POA: Diagnosis not present

## 2024-07-31 DIAGNOSIS — E1169 Type 2 diabetes mellitus with other specified complication: Secondary | ICD-10-CM | POA: Diagnosis present

## 2024-07-31 DIAGNOSIS — I509 Heart failure, unspecified: Secondary | ICD-10-CM

## 2024-07-31 DIAGNOSIS — Z8546 Personal history of malignant neoplasm of prostate: Secondary | ICD-10-CM | POA: Diagnosis not present

## 2024-07-31 DIAGNOSIS — E7849 Other hyperlipidemia: Secondary | ICD-10-CM | POA: Diagnosis present

## 2024-07-31 DIAGNOSIS — I10 Essential (primary) hypertension: Secondary | ICD-10-CM | POA: Diagnosis not present

## 2024-07-31 DIAGNOSIS — Z96651 Presence of right artificial knee joint: Secondary | ICD-10-CM | POA: Diagnosis present

## 2024-07-31 DIAGNOSIS — E66811 Obesity, class 1: Secondary | ICD-10-CM | POA: Diagnosis present

## 2024-07-31 DIAGNOSIS — H3530 Unspecified macular degeneration: Secondary | ICD-10-CM | POA: Diagnosis present

## 2024-07-31 DIAGNOSIS — I5021 Acute systolic (congestive) heart failure: Secondary | ICD-10-CM

## 2024-07-31 DIAGNOSIS — E782 Mixed hyperlipidemia: Secondary | ICD-10-CM | POA: Diagnosis not present

## 2024-07-31 DIAGNOSIS — H9190 Unspecified hearing loss, unspecified ear: Secondary | ICD-10-CM | POA: Diagnosis present

## 2024-07-31 DIAGNOSIS — Z79899 Other long term (current) drug therapy: Secondary | ICD-10-CM | POA: Diagnosis not present

## 2024-07-31 DIAGNOSIS — D509 Iron deficiency anemia, unspecified: Secondary | ICD-10-CM | POA: Diagnosis present

## 2024-07-31 DIAGNOSIS — I480 Paroxysmal atrial fibrillation: Secondary | ICD-10-CM | POA: Diagnosis present

## 2024-07-31 DIAGNOSIS — I359 Nonrheumatic aortic valve disorder, unspecified: Secondary | ICD-10-CM | POA: Diagnosis present

## 2024-07-31 DIAGNOSIS — M549 Dorsalgia, unspecified: Secondary | ICD-10-CM | POA: Diagnosis present

## 2024-07-31 DIAGNOSIS — Z85820 Personal history of malignant melanoma of skin: Secondary | ICD-10-CM | POA: Diagnosis not present

## 2024-07-31 DIAGNOSIS — I132 Hypertensive heart and chronic kidney disease with heart failure and with stage 5 chronic kidney disease, or end stage renal disease: Secondary | ICD-10-CM | POA: Diagnosis present

## 2024-07-31 DIAGNOSIS — I2489 Other forms of acute ischemic heart disease: Secondary | ICD-10-CM | POA: Diagnosis present

## 2024-07-31 DIAGNOSIS — Z961 Presence of intraocular lens: Secondary | ICD-10-CM | POA: Diagnosis present

## 2024-07-31 DIAGNOSIS — N185 Chronic kidney disease, stage 5: Secondary | ICD-10-CM | POA: Diagnosis present

## 2024-07-31 DIAGNOSIS — Z8249 Family history of ischemic heart disease and other diseases of the circulatory system: Secondary | ICD-10-CM | POA: Diagnosis not present

## 2024-07-31 DIAGNOSIS — F32A Depression, unspecified: Secondary | ICD-10-CM | POA: Diagnosis present

## 2024-07-31 DIAGNOSIS — E1122 Type 2 diabetes mellitus with diabetic chronic kidney disease: Secondary | ICD-10-CM | POA: Diagnosis present

## 2024-07-31 DIAGNOSIS — D631 Anemia in chronic kidney disease: Secondary | ICD-10-CM | POA: Diagnosis present

## 2024-07-31 DIAGNOSIS — N179 Acute kidney failure, unspecified: Secondary | ICD-10-CM | POA: Diagnosis present

## 2024-07-31 LAB — CBC WITH DIFFERENTIAL/PLATELET
Abs Immature Granulocytes: 0.02 K/uL (ref 0.00–0.07)
Abs Immature Granulocytes: 0.03 K/uL (ref 0.00–0.07)
Basophils Absolute: 0 K/uL (ref 0.0–0.1)
Basophils Absolute: 0 K/uL (ref 0.0–0.1)
Basophils Relative: 1 %
Basophils Relative: 1 %
Eosinophils Absolute: 0.1 K/uL (ref 0.0–0.5)
Eosinophils Absolute: 0.1 K/uL (ref 0.0–0.5)
Eosinophils Relative: 2 %
Eosinophils Relative: 1 %
HCT: 25.2 % — ABNORMAL LOW (ref 39.0–52.0)
HCT: 26.2 % — ABNORMAL LOW (ref 39.0–52.0)
Hemoglobin: 8 g/dL — ABNORMAL LOW (ref 13.0–17.0)
Hemoglobin: 8.2 g/dL — ABNORMAL LOW (ref 13.0–17.0)
Immature Granulocytes: 0 %
Immature Granulocytes: 0 %
Lymphocytes Relative: 18 %
Lymphocytes Relative: 24 %
Lymphs Abs: 1.4 K/uL (ref 0.7–4.0)
Lymphs Abs: 1.8 K/uL (ref 0.7–4.0)
MCH: 31 pg (ref 26.0–34.0)
MCH: 30.3 pg (ref 26.0–34.0)
MCHC: 31.7 g/dL (ref 30.0–36.0)
MCHC: 31.3 g/dL (ref 30.0–36.0)
MCV: 97.7 fL (ref 80.0–100.0)
MCV: 96.7 fL (ref 80.0–100.0)
Monocytes Absolute: 0.8 K/uL (ref 0.1–1.0)
Monocytes Absolute: 0.5 K/uL (ref 0.1–1.0)
Monocytes Relative: 10 %
Monocytes Relative: 7 %
Neutro Abs: 5.3 K/uL (ref 1.7–7.7)
Neutro Abs: 5 K/uL (ref 1.7–7.7)
Neutrophils Relative %: 69 %
Neutrophils Relative %: 67 %
Platelets: 184 K/uL (ref 150–400)
Platelets: 207 K/uL (ref 150–400)
RBC: 2.58 MIL/uL — ABNORMAL LOW (ref 4.22–5.81)
RBC: 2.71 MIL/uL — ABNORMAL LOW (ref 4.22–5.81)
RDW: 15.1 % (ref 11.5–15.5)
RDW: 15 % (ref 11.5–15.5)
WBC: 7.6 K/uL (ref 4.0–10.5)
WBC: 7.5 K/uL (ref 4.0–10.5)
nRBC: 0 % (ref 0.0–0.2)
nRBC: 0 % (ref 0.0–0.2)

## 2024-07-31 LAB — HEPATIC FUNCTION PANEL
ALT: 15 U/L (ref 0–44)
AST: 25 U/L (ref 15–41)
Albumin: 3.7 g/dL (ref 3.5–5.0)
Alkaline Phosphatase: 72 U/L (ref 38–126)
Bilirubin, Direct: 0.2 mg/dL (ref 0.0–0.2)
Indirect Bilirubin: 0.2 mg/dL — ABNORMAL LOW (ref 0.3–0.9)
Total Bilirubin: 0.4 mg/dL (ref 0.0–1.2)
Total Protein: 5.7 g/dL — ABNORMAL LOW (ref 6.5–8.1)

## 2024-07-31 LAB — BASIC METABOLIC PANEL WITH GFR
Anion gap: 14 (ref 5–15)
BUN: 61 mg/dL — ABNORMAL HIGH (ref 8–23)
CO2: 19 mmol/L — ABNORMAL LOW (ref 22–32)
Calcium: 9.1 mg/dL (ref 8.9–10.3)
Chloride: 106 mmol/L (ref 98–111)
Creatinine, Ser: 5.05 mg/dL — ABNORMAL HIGH (ref 0.61–1.24)
GFR, Estimated: 11 mL/min — ABNORMAL LOW (ref >60)
Glucose, Bld: 86 mg/dL (ref 70–99)
Potassium: 4.6 mmol/L (ref 3.5–5.1)
Sodium: 139 mmol/L (ref 135–145)

## 2024-07-31 LAB — CBG MONITORING, ED
Glucose-Capillary: 108 mg/dL — ABNORMAL HIGH (ref 70–99)
Glucose-Capillary: 92 mg/dL (ref 70–99)
Glucose-Capillary: 136 mg/dL — ABNORMAL HIGH (ref 70–99)

## 2024-07-31 LAB — IRON AND TIBC
Iron: 43 ug/dL — ABNORMAL LOW (ref 45–182)
Saturation Ratios: 16 % — ABNORMAL LOW (ref 17.9–39.5)
TIBC: 273 ug/dL (ref 250–450)
UIBC: 230 ug/dL

## 2024-07-31 LAB — HEMOGLOBIN A1C
Hgb A1c MFr Bld: 5.2 % (ref 4.8–5.6)
Mean Plasma Glucose: 102.54 mg/dL

## 2024-07-31 LAB — TSH: TSH: 1.67 u[IU]/mL (ref 0.350–4.500)

## 2024-07-31 LAB — TROPONIN T, HIGH SENSITIVITY: Troponin T High Sensitivity: 105 ng/L (ref 0–19)

## 2024-07-31 LAB — FERRITIN: Ferritin: 152 ng/mL (ref 24–336)

## 2024-07-31 LAB — MAGNESIUM: Magnesium: 2.3 mg/dL (ref 1.7–2.4)

## 2024-07-31 LAB — GLUCOSE, CAPILLARY: Glucose-Capillary: 145 mg/dL — ABNORMAL HIGH (ref 70–99)

## 2024-07-31 MED ORDER — ACETAMINOPHEN 650 MG RE SUPP: 650.0000 mg | Freq: Four times a day (QID) | RECTAL | Status: DC | PRN

## 2024-07-31 MED ORDER — AMLODIPINE BESYLATE 5 MG PO TABS
5.0000 mg | ORAL_TABLET | Freq: Every day | ORAL | Status: DC
  Administered 2024-07-31 – 2024-08-03 (×4): 5 mg via ORAL
  Filled 2024-07-31 (×4): qty 1

## 2024-07-31 MED ORDER — ASPIRIN 325 MG PO TABS
325.0000 mg | ORAL_TABLET | Freq: Every day | ORAL | Status: DC
  Administered 2024-08-01 – 2024-08-03 (×3): 325 mg via ORAL
  Filled 2024-07-31 (×3): qty 1

## 2024-07-31 MED ORDER — HYDRALAZINE HCL 25 MG PO TABS
25.0000 mg | ORAL_TABLET | Freq: Three times a day (TID) | ORAL | Status: DC
  Administered 2024-07-31 – 2024-08-04 (×12): 25 mg via ORAL
  Filled 2024-07-31 (×12): qty 1

## 2024-07-31 MED ORDER — ACETAMINOPHEN 325 MG PO TABS
650.0000 mg | ORAL_TABLET | Freq: Four times a day (QID) | ORAL | Status: DC | PRN
  Administered 2024-08-01 – 2024-08-02 (×2): 650 mg via ORAL
  Filled 2024-07-31 (×4): qty 2

## 2024-07-31 MED ORDER — INSULIN ASPART 100 UNIT/ML IJ SOLN: 0.0000 [IU] | Freq: Three times a day (TID) | INTRAMUSCULAR | Status: DC

## 2024-07-31 MED ORDER — DOXAZOSIN MESYLATE 1 MG PO TABS
1.0000 mg | ORAL_TABLET | Freq: Every day | ORAL | Status: DC
  Administered 2024-07-31 – 2024-08-03 (×4): 1 mg via ORAL
  Filled 2024-07-31 (×6): qty 1

## 2024-07-31 MED ORDER — FUROSEMIDE 10 MG/ML IJ SOLN
40.0000 mg | Freq: Once | INTRAMUSCULAR | Status: AC
  Administered 2024-07-31: 05:00:00 40 mg via INTRAVENOUS
  Filled 2024-07-31: qty 4

## 2024-07-31 MED ORDER — ATORVASTATIN CALCIUM 10 MG PO TABS
10.0000 mg | ORAL_TABLET | Freq: Every day | ORAL | Status: DC
  Administered 2024-07-31 – 2024-08-04 (×5): 10 mg via ORAL
  Filled 2024-07-31 (×5): qty 1

## 2024-07-31 MED ORDER — SODIUM ZIRCONIUM CYCLOSILICATE 5 G PO PACK
5.0000 g | PACK | Freq: Every day | ORAL | Status: DC
  Administered 2024-08-01: 11:00:00 5 g via ORAL
  Filled 2024-07-31 (×2): qty 1

## 2024-07-31 MED ORDER — HEPARIN (PORCINE) 25000 UT/250ML-% IV SOLN
1250.0000 [IU]/h | INTRAVENOUS | Status: DC
  Administered 2024-07-31: 01:00:00 1250 [IU]/h via INTRAVENOUS
  Filled 2024-07-31: qty 250

## 2024-07-31 MED ORDER — ASPIRIN 325 MG PO TABS
325.0000 mg | ORAL_TABLET | Freq: Every day | ORAL | Status: DC
  Administered 2024-07-31: 10:00:00 325 mg via ORAL
  Filled 2024-07-31: qty 1

## 2024-07-31 MED ORDER — HEPARIN BOLUS VIA INFUSION
4000.0000 [IU] | Freq: Once | INTRAVENOUS | Status: AC
  Administered 2024-07-31: 01:00:00 4000 [IU] via INTRAVENOUS
  Filled 2024-07-31: qty 4000

## 2024-07-31 MED ORDER — FUROSEMIDE 10 MG/ML IJ SOLN
80.0000 mg | Freq: Two times a day (BID) | INTRAMUSCULAR | Status: AC
  Administered 2024-07-31 – 2024-08-01 (×3): 80 mg via INTRAVENOUS
  Filled 2024-07-31 (×3): qty 8

## 2024-07-31 NOTE — H&P (Signed)
 History and Physical    Ricardo D Lamphier Jr. FMW:[id number] DOB: Nov 28, 1940 DOA: 07/30/2024  Patient coming from: Home.  Chief Complaint: Shortness of breath.  HPI: Ricardo Mayer. is a 83 y.o. male with history of chronic kidney disease stage V followed by Dr. DOROTHA Blanch nephrologist on Lasix  40 mg daily for the last 2 months has been having increasing shortness of breath on exertion for the last 2 months and had followed up with his primary care physician who ordered 2D echo which was done on 07/25/2024 which showed EF of 30 to 35% presents to the ER because of worsening shortness of breath.  Patient symptoms are mostly on exertion and patient states he has gained at least 20 pounds weight despite taking Lasix .  Denies any chest pain productive cough fever or chills.  Has increasing peripheral edema.  ED Course: In the ER chest x-ray shows pleural effusion and features concerning for CHF.  Labs show proBNP of more than 3500 troponins 110 105.  Creatinine 5 hemoglobin 8.7.  EKG shows sinus tachycardia.  On-call cardiologist was consulted.  Patient started on heparin  due to elevated troponin and IV Lasix  total of 80 mg was given.  Review of Systems: As per HPI, rest all negative.   Past Medical History:  Diagnosis Date   Adenomatous polyp of colon    Arthritis    bilateral knees   Atypical mole 2016   left shoulder sever wider shave   BCC (basal cell carcinoma of skin) 2011   left neck tx mohs   BCC (basal cell carcinoma of skin) 2015   left sideburn tx with bx   BCC (basal cell carcinoma of skin) 2015   right post shoulder tx with bx   BCC (basal cell carcinoma of skin) 2016   left chest tx with bx   BCC (basal cell carcinoma of skin) 2017   left cheek cx3 15fu   Cataract    CKD (chronic kidney disease), stage II    Dr. 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 Georgina.PCP   History  of basal cell cancer 2011   right inner eye tx mohs   History of DVT of lower extremity    2011--  LEFT LEG   History of kidney stones    History of melanoma excision    RIGHT ARM   Hyperlipidemia    Hypertension    Impaired hearing    hearing aids bilateral   Macular degeneration    Melanoma (HCC) 1990   arm and back per patient  -Melanoma   Melanoma of skin (HCC) 10/09/2013   Near syncope 08/25/2015   Dr. Ida oncefound blood clots in lung   Nocturia    Prostate cancer [facility]) 2014   Dr. Watt manuel- seed implant '14   TIA (transient ischemic attack) 04/08/2016   Ulcer of left lower leg (HCC)    recent pcp visit dr georgina--  ordered betadine  wet/dry dsg daily (03-18-2013)  CONSULT WITH [facility] IN 1 WEEK    Past Surgical History:  Procedure Laterality Date   CATARACT EXTRACTION W/PHACO Left 10/25/2021   Procedure: CATARACT EXTRACTION PHACO AND INTRAOCULAR LENS PLACEMENT (IOC);  Surgeon: Harrie Agent, MD;  Location: [facility];  Service: Ophthalmology;  Laterality: Left;  CDE: 24.87   CATARACT EXTRACTION W/PHACO Right 04/19/2024   Procedure: PHACOEMULSIFICATION, CATARACT, WITH IOL INSERTION;  Surgeon: Harrie Agent, MD;  Location: [facility];  Service: Ophthalmology;  Laterality: Right;  CDE: 11.49   COLONOSCOPY     EYE SURGERY     INSERTION, STENT, DRUG-ELUTING, LACRIMAL CANALICULUS Right 04/19/2024   Procedure: INSERTION, STENT, DRUG-ELUTING, LACRIMAL CANALICULUS;  Surgeon: Harrie Agent, MD;  Location: [facility];  Service: Ophthalmology;  Laterality: Right;   JOINT REPLACEMENT     KIDNEY STONE EXTRACTIONS  LAST ONE 1970   X2 OPEN/  X1  URETEROSCOPY   MELANOMA EXCISION RIGHT ARM  1990   POLYPECTOMY     PROSTATE BIOPSY     RADIOACTIVE SEED IMPLANT N/A 03/21/2013   Procedure: RADIOACTIVE SEED IMPLANT;  Surgeon: Norleen JINNY Seltzer, MD;  Location[facility];  Service: Urology;  Laterality: N/A;  87 seeds implanted     TONSILLECTOMY     TOTAL KNEE  ARTHROPLASTY Right 03/21/2016   Procedure: RIGHT TOTAL KNEE ARTHROPLASTY;  Surgeon: Donnice Car, MD;  Location: [facility];  Service: Orthopedics;  Laterality: Right;     reports that he has never smoked. He has never used smokeless tobacco. He reports that he does not drink alcohol and does not use drugs.  Allergies[1]  Family History  Problem Relation Age of Onset   Breast cancer Mother    Stroke Mother    Hypertension Mother    Lung cancer Father        smoked   Ulcers Father    Heart disease Brother 75       stent   Esophageal cancer Brother    Cancer Paternal Uncle        prostate   Cancer Paternal Grandfather        prostate   Heart attack Neg Hx    Colon cancer Neg Hx    Colon polyps Neg Hx    Rectal cancer Neg Hx    Stomach cancer Neg Hx     Prior to Admission medications  Medication Sig Start Date End Date Taking? Authorizing Provider  albuterol  (VENTOLIN  HFA) 108 (90 Base) MCG/ACT inhaler Inhale 2 puffs into the lungs every 6 (six) hours as needed for wheezing or shortness of breath. 06/24/24   Lavell Bari LABOR, FNP  amLODipine  (NORVASC ) 5 MG tablet Take 1 tablet (5 mg total) by mouth daily. 06/05/24   [name], DO  aspirin  325 MG tablet Take 1 tablet (325 mg total) by mouth daily. 05/13/20   [name], DO  atorvastatin  (LIPITOR) 10 MG tablet Take 1 tablet (10 mg total) by mouth daily. 06/05/24   [name], DO  azelastine  (ASTELIN ) 0.1 % nasal spray Place 1 spray into both nostrils 2 (two) times daily. For runny nose 06/24/24   Lavell Bari A, FNP  benzonatate  (TESSALON ) 100 MG capsule Take 1 capsule (100 mg total) by mouth 2 (two) times daily as needed for cough. 06/24/24   Lavell Bari A, FNP  cholecalciferol (VITAMIN D3) 25 MCG (1000 UT) tablet Take 1,000 Units by mouth daily.    [provider]  doxazosin  (CARDURA ) 1 MG tablet Take 1 mg by mouth at bedtime. 12/02/22   [provider]  doxycycline  (ADOXA) 100 MG  tablet Take 1 tablet (100 mg total) by mouth 2 (two) times daily. 06/24/24   Lavell Bari A, FNP  furosemide  (LASIX ) 20 MG tablet TAKE 1 AND 1/2 TABLETS BY MOUTH  DAILY AS NEEDED FOR SWELLING 05/08/24   Jolinda Norene M, DO  loratadine  (CLARITIN ) 10 MG tablet Take 1  tablet EVERY OTHER day.  RENAL DOSING!!! Please reiterate this. 11/07/22   [name], DO  olmesartan  (BENICAR ) 40 MG tablet Take 1 tablet (40 mg total) by mouth daily. 06/05/24   [name], DO  sodium zirconium cyclosilicate  (LOKELMA ) 10 g PACK packet Take 10 g by mouth daily. 03/25/24   Patsey Lot, MD    Physical Exam: Constitutional: Moderately built and nourished. Vitals:   07/31/24 0315 07/31/24 0330 07/31/24 0345 07/31/24 0400  BP: (!) 167/94     Pulse: 96 89 90 91  Resp: (!) 23 (!) 22 19 18   Temp:      TempSrc:      SpO2: 100% 100% 100% 100%  Weight:      Height:       Eyes: Anicteric no pallor. ENMT: No discharge from the ears eyes nose or mouth. Neck: No mass felt.  No neck rigidity.  JVD elevated. Respiratory: No rhonchi or crepitations. Cardiovascular: S1-S2 heard. Abdomen: Distended nontender bowel sound present. Musculoskeletal: Mild edema over the lower extremity. Skin: Chronic skin changes. Neurologic: Alert awake oriented to time place and person.  Moves all extremities. Psychiatric: Appears normal.  Normal affect.   Labs on Admission: I have personally reviewed following labs and imaging studies  CBC: Recent Labs  Lab 07/30/24 1800  WBC 8.2  HGB 8.7*  HCT 27.5*  MCV 98.6  PLT 241   Basic Metabolic Panel: Recent Labs  Lab 07/30/24 1800  NA 140  K 4.7  CL 106  CO2 18*  GLUCOSE 89  BUN 63*  CREATININE 5.07*  CALCIUM  9.4   GFR: Estimated Creatinine Clearance: 12.7 mL/min (A) (by C-G formula based on SCr of 5.07 mg/dL (H)). Liver Function Tests: No results for input(s): AST, ALT, ALKPHOS, BILITOT, PROT, ALBUMIN in the last 168 hours. No  results for input(s): LIPASE, AMYLASE in the last 168 hours. No results for input(s): AMMONIA in the last 168 hours. Coagulation Profile: No results for input(s): INR, PROTIME in the last 168 hours. Cardiac Enzymes: No results for input(s): CKTOTAL, CKMB, CKMBINDEX, TROPONINI in the last 168 hours. BNP (last 3 results) Recent Labs    07/30/24 1800  PROBNP >35,000.0*   HbA1C: No results for input(s): HGBA1C in the last 72 hours. CBG: No results for input(s): GLUCAP in the last 168 hours. Lipid Profile: No results for input(s): CHOL, HDL, LDLCALC, TRIG, CHOLHDL, LDLDIRECT in the last 72 hours. Thyroid  Function Tests: Recent Labs    07/31/24 0046  TSH 1.670   Anemia Panel: Recent Labs    07/31/24 0046  TIBC 273  IRON 43*   Urine analysis:    Component Value Date/Time   COLORURINE STRAW (A) 03/25/2024 1300   APPEARANCEUR CLEAR 03/25/2024 1300   APPEARANCEUR Clear 02/01/2018 0843   LABSPEC 1.011 03/25/2024 1300   PHURINE 6.0 03/25/2024 1300   GLUCOSEU 50 (A) 03/25/2024 1300   HGBUR NEGATIVE 03/25/2024 1300   BILIRUBINUR NEGATIVE 03/25/2024 1300   BILIRUBINUR Negative 02/01/2018 0843   KETONESUR NEGATIVE 03/25/2024 1300   PROTEINUR 100 (A) 03/25/2024 1300   UROBILINOGEN negative 12/16/2014 1529   NITRITE NEGATIVE 03/25/2024 1300   LEUKOCYTESUR NEGATIVE 03/25/2024 1300   Sepsis Labs: @LABRCNTIP (procalcitonin:4,lacticidven:4) )No results found for this or any previous visit (from the past 240 hours).   Radiological Exams on Admission: DG Chest 2 View Result Date: 07/30/2024 EXAM: 2 VIEW(S) XRAY OF THE CHEST 07/30/2024 06:25:00 PM COMPARISON: Comparison with 07/25/2024. CLINICAL HISTORY: shortness of breath FINDINGS: LUNGS AND PLEURA: No  focal pulmonary opacity. No vascular congestion or edema. Small bilateral pleural effusions with basilar atelectasis, greater on the right. Similar appearance to prior study. No pneumothorax. HEART AND  MEDIASTINUM: Mild cardiac enlargement. Mediastinal contours appear intact. BONES AND SOFT TISSUES: Degenerative changes in the spine and shoulders. IMPRESSION: 1. Small bilateral pleural effusions with basilar atelectasis, greater on the right, similar to prior study. 2. Mild cardiac enlargement without vascular congestion or edema. Electronically signed by: Elsie Gravely MD 07/30/2024 06:37 PM EST RP Workstation: [id number]    EKG: Independently reviewed.  Sinus tachycardia.  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  Appreciate cardiology consult.  Patient has received so far 80 mg IV Lasix .  Will continue with 80 IV every 12 for now.  Closely follow intake output metabolic panel daily weights.  If patient does not have adequate diuresis may need nephrology input.  Not on ACE or ARB due to renal failure. Elevated troponin denies any chest pain.  Patient was started on heparin  infusion per cardiology recommendations.  Patient is already on statins.  Patient states that his ophthalmologist had advised him not to be on chronic anticoagulation. Chronic kidney disease stage V creatinine at around baseline.  May need nephrology input if patient does not get adequate diuresis with IV Lasix . Hypertension on amlodipine  and doxazosin . Anemia likely from renal disease follow CBC.  Mild worsening from previous.  Check anemia panel. Diabetes mellitus type 2 presently not on any medications.  Check hemoglobin A1c.  Sliding scale coverage. Hyperlipidemia on  statins.  Since patient has acute CHF will need IV diuresis and more than 2 midnight stay.  DVT prophylaxis: Heparin  infusion. Code Status: Full code as confirmed with patient. Family Communication: Patient's son at the bedside. Disposition Plan: Progressive care. Consults called: Cardiology. Admission status: Inpatient.         [1] No Known Allergies

## 2024-07-31 NOTE — Progress Notes (Signed)
 PROGRESS NOTE  Ricardo Mayer. FMW:[id number] DOB: Jul 28, 1941 DOA: 07/30/2024 PCP: [name], DO   LOS: 0 days   Brief Narrative / Interim history: 83 year old male with CKD 5 on chronic furosemide  40 mg BID for the past 2 months comes into the hospital with 20 pound weight gain, increased shortness of breath with exertion and lower extremity swelling.  Underwent an echo by PCP on 07/25/2024 which showed EF of 30-35%.  Imaging in the ED showed CHF, elevated proBNP and elevated troponin.  Cardiology consulted  Subjective / 24h Interval events: He is very hard of hearing but overall doing well this morning.  Son is at bedside and tells me he urinated about 1 L overnight  Assesement and Plan: Principal problem Acute systolic CHF -recent echo showed EF 35 to 35%.  Has been placed on furosemide , 1 L output over the last 11 hours, not documented in the chart but son tells me he filled 1 jug overnight which is about a liter - Cardiology consulted, appreciate input  Active problem Elevated troponin-no chest pain, started on heparin  last night but I see that it has been discontinued by cardiology this morning.  Continue statin  CKD 5 -most recent creatinine as an outpatient in August 2025 was 5.5.  Currently at 5.0, and seems to be stable this morning after receiving furosemide .  Urine output is decent.  Continue to monitor creatinine, urine output, weights, if we are unable to remove the fluid may need nephrology consult  Hyperlipidemia-continue statin  Hypertension-continue amlodipine , doxazosin   DM2-not on medications, continue sliding scale.  A1c pending  Obesity, class I -BMI is 36 however he has a degree of fluid overload  Scheduled Meds:  amLODipine   5 mg Oral Daily   aspirin   325 mg Oral Daily   atorvastatin   10 mg Oral Daily   doxazosin   1 mg Oral QHS   furosemide   80 mg Intravenous Q12H   insulin  aspart  0-6 Units Subcutaneous TID WC   lidocaine   1 patch Transdermal  Q24H   sodium zirconium cyclosilicate   5 g Oral Daily   Continuous Infusions: PRN Meds:.acetaminophen  **OR** acetaminophen   Current Outpatient Medications  Medication Instructions   albuterol  (VENTOLIN  HFA) 108 (90 Base) MCG/ACT inhaler 2 puffs, Inhalation, Every 6 hours PRN   amLODipine  (NORVASC ) 5 mg, Oral, Daily   aspirin  325 mg, Oral, Daily   atorvastatin  (LIPITOR) 10 mg, Oral, Daily   azelastine  (ASTELIN ) 0.1 % nasal spray 1 spray, Each Nare, 2 times daily, For runny nose   benzonatate  (TESSALON ) 100 mg, Oral, 2 times daily PRN   cholecalciferol (VITAMIN D3) 1,000 Units, Daily   doxazosin  (CARDURA ) 1 mg, Daily at bedtime   doxycycline  (ADOXA) 100 mg, Oral, 2 times daily   furosemide  (LASIX ) 20 MG tablet TAKE 1 AND 1/2 TABLETS BY MOUTH  DAILY AS NEEDED FOR SWELLING   Lokelma  10 g, Oral, Daily   loratadine  (CLARITIN ) 10 MG tablet Take 1 tablet EVERY OTHER day.  RENAL DOSING!!! Please reiterate this.   olmesartan  (BENICAR ) 40 mg, Oral, Daily    Diet Orders (From admission, onward)     Start     Ordered   07/31/24 0419  Diet renal/carb modified with fluid restriction Diet-HS Snack? Nothing; Fluid restriction: 1200 mL Fluid; Room service appropriate? Yes; Fluid consistency: Thin  Diet effective now       Question Answer Comment  Diet-HS Snack? Nothing   Fluid restriction: 1200 mL Fluid   Room service appropriate? Yes  Fluid consistency: Thin      07/31/24 0419            DVT prophylaxis:    Lab Results  Component Value Date   PLT 207 07/31/2024      Code Status: Full Code  Family Communication: Son present at bedside  Status is: Inpatient Remains inpatient appropriate because: severity of illness  Level of care: Progressive  Consultants:  Cardiology   Objective: Vitals:   07/31/24 0513 07/31/24 0737 07/31/24 0738 07/31/24 1007  BP:  (!) 148/75 (!) 148/75 (!) 145/73  Pulse:  91    Resp:  16    Temp: 97.9 F (36.6 C)     TempSrc: Oral     SpO2:   100%    Weight:      Height:        Intake/Output Summary (Last 24 hours) at 07/31/2024 1009 Last data filed at 07/31/2024 1005 Gross per 24 hour  Intake 115.53 ml  Output 1200 ml  Net -1084.47 ml   Wt Readings from Last 3 Encounters:  07/30/24 104.3 kg  07/30/24 105.2 kg  07/25/24 105.2 kg    Examination:  Constitutional: NAD Eyes: no scleral icterus ENMT: Mucous membranes are moist.  Neck: normal, supple Respiratory: Bibasilar crackles Cardiovascular: Regular rate and rhythm, no murmurs / rubs / gallops.  2+ LE edema.  Abdomen: non distended, no tenderness. Bowel sounds positive.  Musculoskeletal: no clubbing / cyanosis.   Data Reviewed: I have independently reviewed following labs and imaging studies   CBC Recent Labs  Lab 07/30/24 1800 07/31/24 0511 07/31/24 0819  WBC 8.2 7.6 7.5  HGB 8.7* 8.0* 8.2*  HCT 27.5* 25.2* 26.2*  PLT 241 184 207  MCV 98.6 97.7 96.7  MCH 31.2 31.0 30.3  MCHC 31.6 31.7 31.3  RDW 15.0 15.1 15.0  LYMPHSABS  --  1.4 1.8  MONOABS  --  0.8 0.5  EOSABS  --  0.1 0.1  BASOSABS  --  0.0 0.0    Recent Labs  Lab 07/30/24 1800 07/31/24 0046 07/31/24 0500 07/31/24 0511  NA 140  --   --  139  K 4.7  --   --  4.6  CL 106  --   --  106  CO2 18*  --   --  19*  GLUCOSE 89  --   --  86  BUN 63*  --   --  61*  CREATININE 5.07*  --   --  5.05*  CALCIUM  9.4  --   --  9.1  AST  --   --   --  25  ALT  --   --   --  15  ALKPHOS  --   --   --  72  BILITOT  --   --   --  0.4  ALBUMIN  --   --   --  3.7  MG  --   --   --  2.3  TSH  --  1.670  --   --   HGBA1C  --   --  5.2  --     ------------------------------------------------------------------------------------------------------------------ No results for input(s): CHOL, HDL, LDLCALC, TRIG, CHOLHDL, LDLDIRECT in the last 72 hours.  Lab Results  Component Value Date   HGBA1C 5.2 07/31/2024    ------------------------------------------------------------------------------------------------------------------ Recent Labs    07/31/24 0046  TSH 1.670    Cardiac Enzymes No results for input(s): CKMB, TROPONINI, MYOGLOBIN in the last 168 hours.  Invalid input(s): CK ------------------------------------------------------------------------------------------------------------------  Component Value Date/Time   BNP 105.2 (H) 05/29/2015 1115    CBG: Recent Labs  Lab 07/31/24 0813  GLUCAP 108*    No results found for this or any previous visit (from the past 240 hours).   Radiology Studies: US  RENAL Result Date: 07/31/2024 CLINICAL DATA:  [id number] AKI (acute kidney injury) [id number] EXAM: RENAL / URINARY TRACT ULTRASOUND COMPLETE COMPARISON:  03/25/2024 FINDINGS: Right Kidney: Renal measurements: 9.6 x 4.4 x 4 cm = volume: 87 mL.Mildly increased echogenicity. Upper pole cyst measuring 1.1 cm. No hydronephrosis or nephrolithiasis. Left Kidney: Renal measurements: 9.4 x 5.4 x 5 cm = volume: 132 mL. Mildly increased echogenicity. No mass. No hydronephrosis or nephrolithiasis. Bladder: Appears normal for degree of bladder distention. Other: None. IMPRESSION: No hydronephrosis or nephrolithiasis. Renal parenchymal changes suggestive of chronic medical renal disease. Electronically Signed   By: Rogelia Myers M.D.   On: 07/31/2024 09:01   DG Chest 2 View Result Date: 07/30/2024 EXAM: 2 VIEW(S) XRAY OF THE CHEST 07/30/2024 06:25:00 PM COMPARISON: Comparison with 07/25/2024. CLINICAL HISTORY: shortness of breath FINDINGS: LUNGS AND PLEURA: No focal pulmonary opacity. No vascular congestion or edema. Small bilateral pleural effusions with basilar atelectasis, greater on the right. Similar appearance to prior study. No pneumothorax. HEART AND MEDIASTINUM: Mild cardiac enlargement. Mediastinal contours appear intact. BONES AND SOFT TISSUES: Degenerative changes in the spine and  shoulders. IMPRESSION: 1. Small bilateral pleural effusions with basilar atelectasis, greater on the right, similar to prior study. 2. Mild cardiac enlargement without vascular congestion or edema. Electronically signed by: Elsie Gravely MD 07/30/2024 06:37 PM EST RP Workstation: [id number]     Nilda Fendt, MD, PhD [facility]  Between 7 am - 7 pm I am available, please contact me via Amion (for emergencies) or Securechat (non urgent messages)  Between 7 pm - 7 am I am not available, please contact night coverage MD/APP via Amion

## 2024-07-31 NOTE — ED Notes (Signed)
 Pt provided urinal.

## 2024-07-31 NOTE — ED Notes (Signed)
Pt ate 100% of breakfast tray.

## 2024-07-31 NOTE — ED Notes (Signed)
 Patient transported to Ultrasound

## 2024-07-31 NOTE — Progress Notes (Signed)
 PHARMACY - ANTICOAGULATION CONSULT NOTE  Pharmacy Consult for Heparin  Indication: chest pain/ACS  Allergies[1]  Patient Measurements: Height: 5' 7 (170.2 cm) Weight: 104.3 kg (230 lb) IBW/kg (Calculated) : 66.1 HEPARIN  DW (KG): 89.1  Vital Signs: Temp: 98.7 F (37.1 C) (12/16 2117) Temp Source: Oral (12/16 2117) BP: 157/75 (12/16 2200) Pulse Rate: 99 (12/16 2200)  Labs: Recent Labs    07/30/24 1800  HGB 8.7*  HCT 27.5*  PLT 241  CREATININE 5.07*    Estimated Creatinine Clearance: 12.7 mL/min (A) (by C-G formula based on SCr of 5.07 mg/dL (H)).   Medical History: Past Medical History:  Diagnosis Date   Adenomatous polyp of colon    Arthritis    bilateral knees   Atypical mole 2016   left shoulder sever wider shave   BCC (basal cell carcinoma of skin) 2011   left neck tx mohs   BCC (basal cell carcinoma of skin) 2015   left sideburn tx with bx   BCC (basal cell carcinoma of skin) 2015   right post shoulder tx with bx   BCC (basal cell carcinoma of skin) 2016   left chest tx with bx   BCC (basal cell carcinoma of skin) 2017   left cheek cx3 14fu   Cataract    CKD (chronic kidney disease), stage II    Dr. 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 Georgina.PCP   History of basal cell cancer 2011   right inner eye tx mohs   History of DVT of lower extremity    2011--  LEFT LEG   History of kidney stones    History of melanoma excision    RIGHT ARM   Hyperlipidemia    Hypertension    Impaired hearing    hearing aids bilateral   Macular degeneration    Melanoma (HCC) 1990   arm and back per patient  -Melanoma   Melanoma of skin (HCC) 10/09/2013   Near syncope 08/25/2015   Dr. Ida oncefound blood clots in Mayer   Nocturia    Prostate cancer [facility]) 2014   Dr. Watt Mayer- seed implant '14   TIA (transient ischemic attack) 04/08/2016   Ulcer  of left lower leg (HCC)    recent pcp visit dr Ricardo Mayer--  ordered betadine  wet/dry dsg daily (03-18-2013)  CONSULT WITH [facility] IN 1 WEEK    Medications:  Medications Ordered Prior to Encounter[2]   Assessment: 83 y.o. male with CHF and elevated troponin, possibe ACS, for heparin   Goal of Therapy:  Heparin  level 0.3-0.7 units/ml Monitor platelets by anticoagulation protocol: Yes   Plan:  Heparin  4000 units IV bolus, then start heparin  1250 units/hr Check heparin  level in 8 hours.   Ricardo Mayer, Ricardo Mayer 07/31/2024,12:29 AM      [1] No Known Allergies [2]  No current facility-administered medications on file prior to encounter.   Current Outpatient Medications on File Prior to Encounter  Medication Sig Dispense Refill   albuterol  (VENTOLIN  HFA) 108 (90 Base) MCG/ACT inhaler Inhale 2 puffs into the lungs every 6 (six) hours as needed for wheezing or shortness of breath. 8 g 0   amLODipine  (NORVASC ) 5 MG tablet Take 1 tablet (5 mg total) by mouth daily. 100 tablet 3   aspirin  325 MG tablet Take 1 tablet (325 mg total) by mouth daily. 90 tablet 3   atorvastatin  (LIPITOR) 10  MG tablet Take 1 tablet (10 mg total) by mouth daily. 100 tablet 3   azelastine  (ASTELIN ) 0.1 % nasal spray Place 1 spray into both nostrils 2 (two) times daily. For runny nose 30 mL 12   benzonatate  (TESSALON ) 100 MG capsule Take 1 capsule (100 mg total) by mouth 2 (two) times daily as needed for cough. 20 capsule 0   cholecalciferol (VITAMIN D3) 25 MCG (1000 UT) tablet Take 1,000 Units by mouth daily.     doxazosin  (CARDURA ) 1 MG tablet Take 1 mg by mouth at bedtime.     doxycycline  (ADOXA) 100 MG tablet Take 1 tablet (100 mg total) by mouth 2 (two) times daily. 20 tablet 0   furosemide  (LASIX ) 20 MG tablet TAKE 1 AND 1/2 TABLETS BY MOUTH  DAILY AS NEEDED FOR SWELLING 150 tablet 0   loratadine  (CLARITIN ) 10 MG tablet Take 1 tablet EVERY OTHER day.  RENAL DOSING!!! Please reiterate this. 30 tablet 11    olmesartan  (BENICAR ) 40 MG tablet Take 1 tablet (40 mg total) by mouth daily. 100 tablet 3   sodium zirconium cyclosilicate  (LOKELMA ) 10 g PACK packet Take 10 g by mouth daily. 1 packet 0

## 2024-07-31 NOTE — ED Notes (Signed)
 Pt in bed, pt denies pain, family at bedside, warm blanket given, pt has no other requests before transport.  Pt from department.

## 2024-07-31 NOTE — Progress Notes (Signed)
 Rounding Note   Patient Name: Ricardo Mayer. Date of Encounter: 07/31/2024  [facility] Cardiologist: None   Subjective - Patient admitted overnight and seen by the consulting fellow for new CHF. - The patient reports to me that he has had ongoing SOB/DOE and a 20 pound weight gain over the past month. - Recent echocardiogram demonstrates a moderately reduced LVEF 30-35%. - Currently denies any chest pain formable still has some shortness of breath.  Scheduled Meds:  amLODipine   5 mg Oral Daily   aspirin   325 mg Oral Daily   atorvastatin   10 mg Oral Daily   doxazosin   1 mg Oral QHS   furosemide   80 mg Intravenous Q12H   insulin  aspart  0-6 Units Subcutaneous TID WC   lidocaine   1 patch Transdermal Q24H   sodium zirconium cyclosilicate   5 g Oral Daily   Continuous Infusions:  PRN Meds: acetaminophen  **OR** acetaminophen    Vital Signs  Vitals:   07/31/24 1007 07/31/24 1013 07/31/24 1030 07/31/24 1100  BP: (!) 145/73  (!) 152/69 (!) 150/74  Pulse:   91 88  Resp:   15 17  Temp:  97.9 F (36.6 C)    TempSrc:  Oral    SpO2:   97% 100%  Weight:      Height:        Intake/Output Summary (Last 24 hours) at 07/31/2024 1143 Last data filed at 07/31/2024 1005 Gross per 24 hour  Intake 115.53 ml  Output 1200 ml  Net -1084.47 ml      07/30/2024    9:16 PM 07/30/2024    2:44 PM 07/25/2024    9:43 AM  Last 3 Weights  Weight (lbs) 230 lb 232 lb 232 lb  Weight (kg) 104.327 kg 105.235 kg 105.235 kg      Telemetry NSR- Personally Reviewed  ECG  Sinus tachycardia, LBBB- Personally Reviewed  Physical Exam  GEN: No acute distress, very pleasant Neck: JVD 2 cm above clavicle Cardiac: RRR, no murmurs, rubs, or gallops.  Respiratory: Bilateral rales, no wheezes or rhonchi GI: Soft, nontender, non-distended  MS: 3+ pitting edema bilaterally; No deformity. Neuro:  Nonfocal  Psych: Normal affect   Labs High Sensitivity Troponin:  No results for  input(s): TROPONINIHS in the last 720 hours.  Recent Labs  Lab 07/30/24 1800 07/30/24 2346  TRNPT 110* 105*       Chemistry Recent Labs  Lab 07/30/24 1800 07/31/24 0511  NA 140 139  K 4.7 4.6  CL 106 106  CO2 18* 19*  GLUCOSE 89 86  BUN 63* 61*  CREATININE 5.07* 5.05*  CALCIUM  9.4 9.1  MG  --  2.3  PROT  --  5.7*  ALBUMIN  --  3.7  AST  --  25  ALT  --  15  ALKPHOS  --  72  BILITOT  --  0.4  GFRNONAA 11* 11*  ANIONGAP 17* 14    Lipids No results for input(s): CHOL, TRIG, HDL, LABVLDL, LDLCALC, CHOLHDL in the last 168 hours.  Hematology Recent Labs  Lab 07/30/24 1800 07/31/24 0511 07/31/24 0819  WBC 8.2 7.6 7.5  RBC 2.79* 2.58* 2.71*  HGB 8.7* 8.0* 8.2*  HCT 27.5* 25.2* 26.2*  MCV 98.6 97.7 96.7  MCH 31.2 31.0 30.3  MCHC 31.6 31.7 31.3  RDW 15.0 15.1 15.0  PLT 241 184 207   Thyroid   Recent Labs  Lab 07/31/24 0046  TSH 1.670    BNP Recent Labs  Lab  07/30/24 1800  PROBNP >35,000.0*    DDimer No results for input(s): DDIMER in the last 168 hours.   Radiology  US  RENAL Result Date: 07/31/2024 CLINICAL DATA:  [id number] AKI (acute kidney injury) [id number] EXAM: RENAL / URINARY TRACT ULTRASOUND COMPLETE COMPARISON:  03/25/2024 FINDINGS: Right Kidney: Renal measurements: 9.6 x 4.4 x 4 cm = volume: 87 mL.Mildly increased echogenicity. Upper pole cyst measuring 1.1 cm. No hydronephrosis or nephrolithiasis. Left Kidney: Renal measurements: 9.4 x 5.4 x 5 cm = volume: 132 mL. Mildly increased echogenicity. No mass. No hydronephrosis or nephrolithiasis. Bladder: Appears normal for degree of bladder distention. Other: None. IMPRESSION: No hydronephrosis or nephrolithiasis. Renal parenchymal changes suggestive of chronic medical renal disease. Electronically Signed   By: Rogelia Myers M.D.   On: 07/31/2024 09:01   DG Chest 2 View Result Date: 07/30/2024 EXAM: 2 VIEW(S) XRAY OF THE CHEST 07/30/2024 06:25:00 PM COMPARISON: Comparison with 07/25/2024.  CLINICAL HISTORY: shortness of breath FINDINGS: LUNGS AND PLEURA: No focal pulmonary opacity. No vascular congestion or edema. Small bilateral pleural effusions with basilar atelectasis, greater on the right. Similar appearance to prior study. No pneumothorax. HEART AND MEDIASTINUM: Mild cardiac enlargement. Mediastinal contours appear intact. BONES AND SOFT TISSUES: Degenerative changes in the spine and shoulders. IMPRESSION: 1. Small bilateral pleural effusions with basilar atelectasis, greater on the right, similar to prior study. 2. Mild cardiac enlargement without vascular congestion or edema. Electronically signed by: Elsie Gravely MD 07/30/2024 06:37 PM EST RP Workstation: [id number]    Cardiac Studies    TTE 07/25/24:  IMPRESSIONS     1. Left ventricular ejection fraction, by estimation, is 30 to 35%. The  left ventricle has moderately decreased function. Left ventricular  endocardial border not optimally defined to evaluate regional wall motion.  There is mild left ventricular  hypertrophy. Left ventricular diastolic parameters are consistent with  Grade I diastolic dysfunction (impaired relaxation).   2. Right ventricular systolic function is normal. The right ventricular  size is normal. Tricuspid regurgitation signal is inadequate for assessing  PA pressure.   3. The mitral valve is abnormal. Mild mitral valve regurgitation. Mild  mitral stenosis. The mean mitral valve gradient is 3.0 mmHg. Severe mitral  annular calcification.   4. 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 Calcified and restricted  right coronary cusp. Moderate restriction in remaining leaflet mobility.  Aortic valve regurgitation is moderate. Mild aortic valve stenosis based  on gradients but likely moderate to  severe aortic valve stenosis based on leaflet mobility. Conisder low  flow-low gradient AS. Aortic regurgitation PHT measures 252 msec. Aortic  valve area, by VTI measures 1.77 cm. Aortic valve  mean gradient measures  17.0 mmHg. Aortic valve Vmax measures  2.76 m/s.   5. Aortic dilatation noted. There is mild dilatation of the aortic root,  measuring 40 mm. There is mild dilatation of the ascending aorta,  measuring 43 mm.   Patient Profile   Mr. Hove is a 83 y.o. 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  Cardiology is consulted to evaluate CHF exacerbation.  Assessment & Plan    #Acute Decompensated HFrEF Exacerbation (EF = 30-35%) :: The patient presented with ongoing symptoms of weight gain, peripheral edema and shortness of breath all consistent with decompensated CHF.  He has a newly reduced EF to 30 to 35% found by his  PCP on outpatient echocardiography.  Unclear if this is an ischemic or nonischemic cardiomyopathy; however, his renal function is severely reduced and thus he is not a candidate for left heart catheterization at this time.  Furthermore, his renal insufficiency makes the initiation of GDMT challenging.  He will need aggressive diuresis to improve his volume status and additional evaluation will depend on his renal function. Continue Lasix  80 mg every 12 hours Will likely start a beta-blocker first once more stable for GDMT Start hydralazine  25 mg every 8 hours Currently ACEI/ARB/ARNI, MRA and SGLT2 are contraindicated due to his renal function Ideally would like to do an LHC but this is contraindicated given his renal dysfunction. May consider a stress test as an outpatient. Strict I's and O's Daily weights  #HTN Starting hydralazine  as described above Continue amlodipine  5 mg daily for now Will eventually add a beta-blocker for GDMT  #HLD :: Recent LDL of 17 as an outpatient.  Will continue current regiment. Continue atorvastatin  10 mg daily  #AKI on CKD :: Markedly elevated serum creatinine on admission  consistent with AKI on CKD.  At least in part this is related to cardiorenal syndrome.  I expect his renal function to improve somewhat with diuresis, but his renal function has not been completely normal for some time.  Will avoid nephrotoxic drugs and continue diuresis. Continue diuresis as above Strict I's and O's       For questions or updates, please contact [facility] Please consult [email or web address] for contact info under       Signed, Georganna Archer, MD  07/31/2024, 11:43 AM

## 2024-08-01 DIAGNOSIS — I5021 Acute systolic (congestive) heart failure: Secondary | ICD-10-CM | POA: Diagnosis not present

## 2024-08-01 LAB — PREPARE RBC (CROSSMATCH)

## 2024-08-01 LAB — CBC
HCT: 23.5 % — ABNORMAL LOW (ref 39.0–52.0)
HCT: 26.3 % — ABNORMAL LOW (ref 39.0–52.0)
Hemoglobin: 7.6 g/dL — ABNORMAL LOW (ref 13.0–17.0)
Hemoglobin: 8.8 g/dL — ABNORMAL LOW (ref 13.0–17.0)
MCH: 30.6 pg (ref 26.0–34.0)
MCH: 31.7 pg (ref 26.0–34.0)
MCHC: 32.3 g/dL (ref 30.0–36.0)
MCHC: 33.5 g/dL (ref 30.0–36.0)
MCV: 94.8 fL (ref 80.0–100.0)
MCV: 94.6 fL (ref 80.0–100.0)
Platelets: 185 K/uL (ref 150–400)
Platelets: 169 K/uL (ref 150–400)
RBC: 2.48 MIL/uL — ABNORMAL LOW (ref 4.22–5.81)
RBC: 2.78 MIL/uL — ABNORMAL LOW (ref 4.22–5.81)
RDW: 14.6 % (ref 11.5–15.5)
RDW: 14.9 % (ref 11.5–15.5)
WBC: 7.1 K/uL (ref 4.0–10.5)
WBC: 7.6 K/uL (ref 4.0–10.5)
nRBC: 0 % (ref 0.0–0.2)
nRBC: 0 % (ref 0.0–0.2)

## 2024-08-01 LAB — COMPREHENSIVE METABOLIC PANEL WITH GFR
ALT: 12 U/L (ref 0–44)
AST: 22 U/L (ref 15–41)
Albumin: 3.3 g/dL — ABNORMAL LOW (ref 3.5–5.0)
Alkaline Phosphatase: 62 U/L (ref 38–126)
Anion gap: 14 (ref 5–15)
BUN: 67 mg/dL — ABNORMAL HIGH (ref 8–23)
CO2: 18 mmol/L — ABNORMAL LOW (ref 22–32)
Calcium: 8.6 mg/dL — ABNORMAL LOW (ref 8.9–10.3)
Chloride: 105 mmol/L (ref 98–111)
Creatinine, Ser: 5.47 mg/dL — ABNORMAL HIGH (ref 0.61–1.24)
GFR, Estimated: 10 mL/min — ABNORMAL LOW (ref >60)
Glucose, Bld: 90 mg/dL (ref 70–99)
Potassium: 4.5 mmol/L (ref 3.5–5.1)
Sodium: 137 mmol/L (ref 135–145)
Total Bilirubin: 0.3 mg/dL (ref 0.0–1.2)
Total Protein: 5.2 g/dL — ABNORMAL LOW (ref 6.5–8.1)

## 2024-08-01 LAB — GLUCOSE, CAPILLARY
Glucose-Capillary: 85 mg/dL (ref 70–99)
Glucose-Capillary: 110 mg/dL — ABNORMAL HIGH (ref 70–99)
Glucose-Capillary: 108 mg/dL — ABNORMAL HIGH (ref 70–99)
Glucose-Capillary: 119 mg/dL — ABNORMAL HIGH (ref 70–99)

## 2024-08-01 LAB — MAGNESIUM: Magnesium: 2.2 mg/dL (ref 1.7–2.4)

## 2024-08-01 MED ORDER — FUROSEMIDE 10 MG/ML IJ SOLN
80.0000 mg | Freq: Two times a day (BID) | INTRAMUSCULAR | Status: DC
  Administered 2024-08-01 – 2024-08-02 (×2): 80 mg via INTRAVENOUS
  Filled 2024-08-01 (×2): qty 8

## 2024-08-01 MED ORDER — CARVEDILOL 12.5 MG PO TABS
12.5000 mg | ORAL_TABLET | Freq: Two times a day (BID) | ORAL | Status: DC
  Administered 2024-08-01 (×2): 12.5 mg via ORAL
  Filled 2024-08-01 (×2): qty 1

## 2024-08-01 MED ORDER — SODIUM CHLORIDE 0.9% IV SOLUTION: Freq: Once | INTRAVENOUS | Status: AC

## 2024-08-01 MED ORDER — SODIUM CHLORIDE 0.9 % IV SOLN
125.0000 mg | Freq: Every day | INTRAVENOUS | Status: DC
  Administered 2024-08-01 – 2024-08-04 (×4): 125 mg via INTRAVENOUS
  Filled 2024-08-01 (×4): qty 10

## 2024-08-01 NOTE — TOC CM/SW Note (Addendum)
 Transition of Care [facility]) - Inpatient Brief Assessment   Patient Details  Name: Ricardo Mayer. MRN: [id number] Date of Birth: 1941-02-25  Transition of Care [facility]) CM/SW Contact:    Waddell Barnie Rama, RN Phone Number: 08/01/2024, 11:39 AM   Clinical Narrative: From home with spouse, has PCP and insurance on file, states has no HH services in place at this time, has walker and a cane at home.  States family member  (son) will transport them home at [employer] and family is support system, states gets medications from [facility] in [place].  Pta self ambulatory.   There are no ICM  needs identified  at this time.  Please place consult for ICM needs.  Await PT/OT eval.  He has new CHF, will need HHRN for disease management.   Transition of Care Asessment: Insurance and Status: Insurance coverage has been reviewed Patient has primary care physician: Yes Home environment has been reviewed: home with wife Prior level of function:: ambulatory with walker/cane Prior/Current Home Services: Current home services (walker/cane) Social Drivers of Health Review: SDOH reviewed no interventions necessary   Transition of care needs: no transition of care needs at this time

## 2024-08-01 NOTE — Progress Notes (Signed)
 PROGRESS NOTE  Ricardo Mayer. FMW:[id number] DOB: Oct 09, 1940 DOA: 07/30/2024 PCP: [name], DO   LOS: 1 day   Brief Narrative / Interim history: 83 year old male with CKD 5 on chronic furosemide  40 mg BID for the past 2 months comes into the hospital with 20 pound weight gain, increased shortness of breath with exertion and lower extremity swelling.  Underwent an echo by PCP on 07/25/2024 which showed EF of 30-35%.  Imaging in the ED showed CHF, elevated proBNP and elevated troponin.  Cardiology consulted  Subjective / 24h Interval events: He feels better, feels like his swelling has improved.  He denies any shortness of breath at rest.  He has no chest pain, no nausea or vomiting.  Reports a good appetite.  Assesement and Plan: Principal problem Acute systolic CHF -recent echo showed EF 35 to 35%.  Has been placed on furosemide , continues to have excellent output, net negative almost 3 L this morning.  Weight is improved from 230 pounds on admission to 216 this morning - Cardiology consulted, appreciate input, discussed with Dr. Floretta, continue diuresis  Active problem Elevated troponin-no chest pain, likely demand ischemia.  Continue statin  CKD 5 -most recent creatinine as an outpatient in August 2025 was 5.5.  At 5.0 on admission, up to 5.5 today due to Lasix  which is still close to his baseline.  Continue furosemide   PAF -appears to have new onset A-fib.  He was told by his ophthalmologist not to be on anticoagulation, and it appears that he was last seen in September 2025.  I sent a message to his ophthalmologist, awaiting callback  Anemia-of chronic disease, also due to underlying CKD.  Hemoglobin dipped below 8, transfuse unit of packed red blood cells given underlying cardiac issues  Hyperlipidemia-continue statin  Hypertension-continue amlodipine , doxazosin   DM2-not on medications, continue sliding scale.  Lab Results  Component Value Date   HGBA1C 5.2  07/31/2024   Obesity, class I -BMI is 36 however he has a degree of fluid overload  Scheduled Meds:  sodium chloride    Intravenous Once   amLODipine   5 mg Oral Daily   aspirin   325 mg Oral Daily   atorvastatin   10 mg Oral Daily   carvedilol   12.5 mg Oral BID WC   doxazosin   1 mg Oral QHS   furosemide   80 mg Intravenous Q12H   furosemide   80 mg Intravenous Q12H   hydrALAZINE   25 mg Oral Q8H   insulin  aspart  0-6 Units Subcutaneous TID WC   lidocaine   1 patch Transdermal Q24H   sodium zirconium cyclosilicate   5 g Oral Daily   Continuous Infusions:  ferric gluconate (FERRLECIT ) IVPB     PRN Meds:.acetaminophen  **OR** acetaminophen   Current Outpatient Medications  Medication Instructions   albuterol  (VENTOLIN  HFA) 108 (90 Base) MCG/ACT inhaler 2 puffs, Inhalation, Every 6 hours PRN   amLODipine  (NORVASC ) 5 mg, Oral, Daily   aspirin  325 mg, Oral, Daily   atorvastatin  (LIPITOR) 10 mg, Oral, Daily   azelastine  (ASTELIN ) 0.1 % nasal spray 1 spray, Each Nare, 2 times daily, For runny nose   benzonatate  (TESSALON ) 100 mg, Oral, 2 times daily PRN   cholecalciferol (VITAMIN D3) 1,000 Units, Daily   doxazosin  (CARDURA ) 1 mg, Daily at bedtime   doxycycline  (ADOXA) 100 mg, Oral, 2 times daily   furosemide  (LASIX ) 20 MG tablet TAKE 1 AND 1/2 TABLETS BY MOUTH  DAILY AS NEEDED FOR SWELLING   Lokelma  10 g, Oral, Daily   loratadine  (  CLARITIN ) 10 MG tablet Take 1 tablet EVERY OTHER day.  RENAL DOSING!!! Please reiterate this.   olmesartan  (BENICAR ) 40 mg, Oral, Daily    Diet Orders (From admission, onward)     Start     Ordered   07/31/24 0419  Diet renal/carb modified with fluid restriction Diet-HS Snack? Nothing; Fluid restriction: 1200 mL Fluid; Room service appropriate? Yes; Fluid consistency: Thin  Diet effective now       Question Answer Comment  Diet-HS Snack? Nothing   Fluid restriction: 1200 mL Fluid   Room service appropriate? Yes   Fluid consistency: Thin      07/31/24 0419             DVT prophylaxis:    Lab Results  Component Value Date   PLT 185 08/01/2024      Code Status: Full Code  Family Communication: Son present at bedside  Status is: Inpatient Remains inpatient appropriate because: severity of illness  Level of care: Progressive  Consultants:  Cardiology   Objective: Vitals:   08/01/24 0300 08/01/24 0622 08/01/24 0628 08/01/24 0750  BP: (!) 129/54  (!) 145/83 137/65  Pulse: 94   94  Resp: 15   18  Temp: 98 F (36.7 C)   97.7 F (36.5 C)  TempSrc: Oral   Oral  SpO2: 98%   98%  Weight: 101.5 kg 98.3 kg    Height: 5' 7 (1.702 m)       Intake/Output Summary (Last 24 hours) at 08/01/2024 1006 Last data filed at 08/01/2024 0900 Gross per 24 hour  Intake 300 ml  Output 2150 ml  Net -1850 ml   Wt Readings from Last 3 Encounters:  08/01/24 98.3 kg  07/30/24 105.2 kg  07/25/24 105.2 kg    Examination:  Constitutional: NAD Eyes: lids and conjunctivae normal, no scleral icterus ENMT: mmm Neck: normal, supple Respiratory: clear to auscultation bilaterally, no wheezing, no crackles. Normal respiratory effort.  Cardiovascular: Irregular.  1+ LE edema. Abdomen: soft, no distention, no tenderness. Bowel sounds positive.  Skin: Chronic venous stasis changes bilateral lower extremities   Data Reviewed: I have independently reviewed following labs and imaging studies   CBC Recent Labs  Lab 07/30/24 1800 07/31/24 0511 07/31/24 0819 08/01/24 0232  WBC 8.2 7.6 7.5 7.1  HGB 8.7* 8.0* 8.2* 7.6*  HCT 27.5* 25.2* 26.2* 23.5*  PLT 241 184 207 185  MCV 98.6 97.7 96.7 94.8  MCH 31.2 31.0 30.3 30.6  MCHC 31.6 31.7 31.3 32.3  RDW 15.0 15.1 15.0 14.6  LYMPHSABS  --  1.4 1.8  --   MONOABS  --  0.8 0.5  --   EOSABS  --  0.1 0.1  --   BASOSABS  --  0.0 0.0  --     Recent Labs  Lab 07/30/24 1800 07/31/24 0046 07/31/24 0500 07/31/24 0511 08/01/24 0232  NA 140  --   --  139 137  K 4.7  --   --  4.6 4.5  CL 106  --    --  106 105  CO2 18*  --   --  19* 18*  GLUCOSE 89  --   --  86 90  BUN 63*  --   --  61* 67*  CREATININE 5.07*  --   --  5.05* 5.47*  CALCIUM  9.4  --   --  9.1 8.6*  AST  --   --   --  25 22  ALT  --   --   --  15 12  ALKPHOS  --   --   --  72 62  BILITOT  --   --   --  0.4 0.3  ALBUMIN  --   --   --  3.7 3.3*  MG  --   --   --  2.3 2.2  TSH  --  1.670  --   --   --   HGBA1C  --   --  5.2  --   --     ------------------------------------------------------------------------------------------------------------------ No results for input(s): CHOL, HDL, LDLCALC, TRIG, CHOLHDL, LDLDIRECT in the last 72 hours.  Lab Results  Component Value Date   HGBA1C 5.2 07/31/2024   ------------------------------------------------------------------------------------------------------------------ Recent Labs    07/31/24 0046  TSH 1.670    Cardiac Enzymes No results for input(s): CKMB, TROPONINI, MYOGLOBIN in the last 168 hours.  Invalid input(s): CK ------------------------------------------------------------------------------------------------------------------    Component Value Date/Time   BNP 105.2 (H) 05/29/2015 1115    CBG: Recent Labs  Lab 07/31/24 0813 07/31/24 1156 07/31/24 1651 07/31/24 2107 08/01/24 0545  GLUCAP 108* 92 136* 145* 85    No results found for this or any previous visit (from the past 240 hours).   Radiology Studies: No results found.    Nilda Fendt, MD, PhD [facility]  Between 7 am - 7 pm I am available, please contact me via Amion (for emergencies) or Securechat (non urgent messages)  Between 7 pm - 7 am I am not available, please contact night coverage MD/APP via Amion

## 2024-08-01 NOTE — Progress Notes (Signed)
 Rounding Note   Patient Name: Ricardo Mayer. Date of Encounter: 08/01/2024  [facility] Cardiologist: None   Subjective - No acute events overnight - Patient says that he is starting to feel better with improved breathing - Net -2.9 L yesterday - No complaints today  Scheduled Meds:  sodium chloride    Intravenous Once   amLODipine   5 mg Oral Daily   aspirin   325 mg Oral Daily   atorvastatin   10 mg Oral Daily   carvedilol   12.5 mg Oral BID WC   doxazosin   1 mg Oral QHS   furosemide   80 mg Intravenous Q12H   furosemide   80 mg Intravenous Q12H   hydrALAZINE   25 mg Oral Q8H   insulin  aspart  0-6 Units Subcutaneous TID WC   lidocaine   1 patch Transdermal Q24H   sodium zirconium cyclosilicate   5 g Oral Daily   Continuous Infusions:  ferric gluconate (FERRLECIT ) IVPB     PRN Meds: acetaminophen  **OR** acetaminophen    Vital Signs  Vitals:   08/01/24 0300 08/01/24 0622 08/01/24 0628 08/01/24 0750  BP: (!) 129/54  (!) 145/83 137/65  Pulse: 94   94  Resp: 15   18  Temp: 98 F (36.7 C)   97.7 F (36.5 C)  TempSrc: Oral   Oral  SpO2: 98%   98%  Weight: 101.5 kg 98.3 kg    Height: 5' 7 (1.702 m)       Intake/Output Summary (Last 24 hours) at 08/01/2024 1101 Last data filed at 08/01/2024 0900 Gross per 24 hour  Intake 300 ml  Output 2150 ml  Net -1850 ml      08/01/2024    6:22 AM 08/01/2024    3:00 AM 07/31/2024    6:52 PM  Last 3 Weights  Weight (lbs) 216 lb 11.4 oz 223 lb 12.3 oz 223 lb 12.3 oz  Weight (kg) 98.3 kg 101.5 kg 101.5 kg      Telemetry Atrial fibrillation? - Personally Reviewed  ECG  Atrial fibrillation? - Personally Reviewed  Physical Exam  GEN: No acute distress, very pleasant Neck: No JVD Cardiac: Regular rate with irregularly irregular rhythm, no murmurs, rubs, or gallops.  Respiratory: Bilateral rales, no wheezes or rhonchi GI: Soft, nontender, non-distended  MS: 1+ pitting edema bilaterally; No deformity. Neuro:   Nonfocal  Psych: Normal affect   Labs High Sensitivity Troponin:  No results for input(s): TROPONINIHS in the last 720 hours.  Recent Labs  Lab 07/30/24 1800 07/30/24 2346  TRNPT 110* 105*       Chemistry Recent Labs  Lab 07/30/24 1800 07/31/24 0511 08/01/24 0232  NA 140 139 137  K 4.7 4.6 4.5  CL 106 106 105  CO2 18* 19* 18*  GLUCOSE 89 86 90  BUN 63* 61* 67*  CREATININE 5.07* 5.05* 5.47*  CALCIUM  9.4 9.1 8.6*  MG  --  2.3 2.2  PROT  --  5.7* 5.2*  ALBUMIN  --  3.7 3.3*  AST  --  25 22  ALT  --  15 12  ALKPHOS  --  72 62  BILITOT  --  0.4 0.3  GFRNONAA 11* 11* 10*  ANIONGAP 17* 14 14    Lipids No results for input(s): CHOL, TRIG, HDL, LABVLDL, LDLCALC, CHOLHDL in the last 168 hours.  Hematology Recent Labs  Lab 07/31/24 0511 07/31/24 0819 08/01/24 0232  WBC 7.6 7.5 7.1  RBC 2.58* 2.71* 2.48*  HGB 8.0* 8.2* 7.6*  HCT 25.2* 26.2*  23.5*  MCV 97.7 96.7 94.8  MCH 31.0 30.3 30.6  MCHC 31.7 31.3 32.3  RDW 15.1 15.0 14.6  PLT 184 207 185   Thyroid   Recent Labs  Lab 07/31/24 0046  TSH 1.670    BNP Recent Labs  Lab 07/30/24 1800  PROBNP >35,000.0*    DDimer No results for input(s): DDIMER in the last 168 hours.   Radiology  US  RENAL Result Date: 07/31/2024 CLINICAL DATA:  [id number] AKI (acute kidney injury) [id number] EXAM: RENAL / URINARY TRACT ULTRASOUND COMPLETE COMPARISON:  03/25/2024 FINDINGS: Right Kidney: Renal measurements: 9.6 x 4.4 x 4 cm = volume: 87 mL.Mildly increased echogenicity. Upper pole cyst measuring 1.1 cm. No hydronephrosis or nephrolithiasis. Left Kidney: Renal measurements: 9.4 x 5.4 x 5 cm = volume: 132 mL. Mildly increased echogenicity. No mass. No hydronephrosis or nephrolithiasis. Bladder: Appears normal for degree of bladder distention. Other: None. IMPRESSION: No hydronephrosis or nephrolithiasis. Renal parenchymal changes suggestive of chronic medical renal disease. Electronically Signed   By: Rogelia Myers M.D.    On: 07/31/2024 09:01   DG Chest 2 View Result Date: 07/30/2024 EXAM: 2 VIEW(S) XRAY OF THE CHEST 07/30/2024 06:25:00 PM COMPARISON: Comparison with 07/25/2024. CLINICAL HISTORY: shortness of breath FINDINGS: LUNGS AND PLEURA: No focal pulmonary opacity. No vascular congestion or edema. Small bilateral pleural effusions with basilar atelectasis, greater on the right. Similar appearance to prior study. No pneumothorax. HEART AND MEDIASTINUM: Mild cardiac enlargement. Mediastinal contours appear intact. BONES AND SOFT TISSUES: Degenerative changes in the spine and shoulders. IMPRESSION: 1. Small bilateral pleural effusions with basilar atelectasis, greater on the right, similar to prior study. 2. Mild cardiac enlargement without vascular congestion or edema. Electronically signed by: Elsie Gravely MD 07/30/2024 06:37 PM EST RP Workstation: [id number]    Cardiac Studies    TTE 07/25/24:  IMPRESSIONS     1. Left ventricular ejection fraction, by estimation, is 30 to 35%. The  left ventricle has moderately decreased function. Left ventricular  endocardial border not optimally defined to evaluate regional wall motion.  There is mild left ventricular  hypertrophy. Left ventricular diastolic parameters are consistent with  Grade I diastolic dysfunction (impaired relaxation).   2. Right ventricular systolic function is normal. The right ventricular  size is normal. Tricuspid regurgitation signal is inadequate for assessing  PA pressure.   3. The mitral valve is abnormal. Mild mitral valve regurgitation. Mild  mitral stenosis. The mean mitral valve gradient is 3.0 mmHg. Severe mitral  annular calcification.   4. 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 Calcified and restricted  right coronary cusp. Moderate restriction in remaining leaflet mobility.  Aortic valve regurgitation is moderate. Mild aortic valve stenosis based  on gradients but likely moderate to  severe aortic valve stenosis  based on leaflet mobility. Conisder low  flow-low gradient AS. Aortic regurgitation PHT measures 252 msec. Aortic  valve area, by VTI measures 1.77 cm. Aortic valve mean gradient measures  17.0 mmHg. Aortic valve Vmax measures  2.76 m/s.   5. Aortic dilatation noted. There is mild dilatation of the aortic root,  measuring 40 mm. There is mild dilatation of the ascending aorta,  measuring 43 mm.   Patient Profile   Mr. Gamero is a 83 y.o. 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  Cardiology is consulted to evaluate CHF exacerbation.  Assessment &  Plan    #Acute Decompensated HFrEF Exacerbation (EF = 30-35%) :: The patient presented with ongoing symptoms of weight gain, peripheral edema and shortness of breath all consistent with decompensated CHF.  He has a newly reduced EF to 30 to 35% found by his PCP on outpatient echocardiography.  Unclear if this is an ischemic or nonischemic cardiomyopathy; however, his renal function is severely reduced and thus he is not a candidate for left heart catheterization at this time.  Furthermore, his renal insufficiency makes the initiation of GDMT challenging.  He will need aggressive diuresis to improve his volume status and additional evaluation will depend on his renal function. Continue Lasix  80 mg every 12 hours Start carvedilol  12.5 mg twice daily Continue hydralazine  25 mg every 8 hours Currently ACEI/ARB/ARNI, MRA and SGLT2 are contraindicated due to his renal function Ideally would like to do an LHC but this is contraindicated given his renal dysfunction. May consider a stress test as an outpatient. Strict I's and O's Daily weights  #New Atrial Fibrillation? :: Rhythm on telemetry seems like he is in atrial fibrillation which would be a new diagnosis for him.  ECG is a bit challenging to discern whether  or not he has sinus rhythm with first-degree AV block and sinus arrhythmia versus atrial fibrillation.  I will curbside one of my EP colleagues for assistance as determining this diagnosis is very important.  His CHA2DS2-VASc score = 7 which puts him at very high risk for CVA if he does have atrial fibrillation.  Another complicating factor is that the patient has had bleeding behind his retina and was told that systemic oral anticoagulation is contraindicated due to risk of blindness.  May have to consider Watchman if he does not fact have atrial fibrillation. Will informally discuss diagnosis with EP colleague   #HTN Continue hydralazine  as described above Continue amlodipine  5 mg daily for now Continue carvedilol  12.5 mg twice daily   #HLD :: Recent LDL of 17 as an outpatient.  Will continue current regiment. Continue atorvastatin  10 mg daily  #Iron Deficiency Anemia :: Transferrin saturation <20% and ferritin <200.  No overt signs of bleeding despite drop in hemoglobin. Give IV ferric gluconate 125 mg x7 days  #AKI on CKD :: Markedly elevated serum creatinine on admission consistent with AKI on CKD.  At least in part this is related to cardiorenal syndrome.  I expect his renal function to improve somewhat with diuresis, but his renal function has not been completely normal for some time.  Will avoid nephrotoxic drugs and continue diuresis. Continue diuresis as above Strict I's and O's       For questions or updates, please contact  [facility] Please consult [email or web address] for contact info under       Signed, Georganna Archer, MD  08/01/2024, 11:01 AM

## 2024-08-01 NOTE — Plan of Care (Signed)
°  Problem: Coping: Goal: Ability to adjust to condition or change in health will improve Outcome: Progressing   Problem: Fluid Volume: Goal: Ability to maintain a balanced intake and output will improve Outcome: Progressing   Problem: Health Behavior/Discharge Planning: Goal: Ability to manage health-related needs will improve Outcome: Progressing   Problem: Nutritional: Goal: Progress toward achieving an optimal weight will improve Outcome: Progressing   Problem: Skin Integrity: Goal: Risk for impaired skin integrity will decrease Outcome: Progressing   Problem: Health Behavior/Discharge Planning: Goal: Ability to manage health-related needs will improve Outcome: Progressing

## 2024-08-02 DIAGNOSIS — I5021 Acute systolic (congestive) heart failure: Secondary | ICD-10-CM | POA: Diagnosis not present

## 2024-08-02 LAB — BASIC METABOLIC PANEL WITH GFR
Anion gap: 14 (ref 5–15)
BUN: 79 mg/dL — ABNORMAL HIGH (ref 8–23)
CO2: 19 mmol/L — ABNORMAL LOW (ref 22–32)
Calcium: 8.7 mg/dL — ABNORMAL LOW (ref 8.9–10.3)
Chloride: 103 mmol/L (ref 98–111)
Creatinine, Ser: 5.86 mg/dL — ABNORMAL HIGH (ref 0.61–1.24)
GFR, Estimated: 9 mL/min — ABNORMAL LOW
Glucose, Bld: 89 mg/dL (ref 70–99)
Potassium: 5 mmol/L (ref 3.5–5.1)
Sodium: 136 mmol/L (ref 135–145)

## 2024-08-02 LAB — CBC
HCT: 25.6 % — ABNORMAL LOW (ref 39.0–52.0)
Hemoglobin: 8.4 g/dL — ABNORMAL LOW (ref 13.0–17.0)
MCH: 30.7 pg (ref 26.0–34.0)
MCHC: 32.8 g/dL (ref 30.0–36.0)
MCV: 93.4 fL (ref 80.0–100.0)
Platelets: 175 K/uL (ref 150–400)
RBC: 2.74 MIL/uL — ABNORMAL LOW (ref 4.22–5.81)
RDW: 14.9 % (ref 11.5–15.5)
WBC: 7.3 K/uL (ref 4.0–10.5)
nRBC: 0 % (ref 0.0–0.2)

## 2024-08-02 LAB — TYPE AND SCREEN
ABO/RH(D): A NEG
Antibody Screen: NEGATIVE
Unit division: 0

## 2024-08-02 LAB — GLUCOSE, CAPILLARY
Glucose-Capillary: 80 mg/dL (ref 70–99)
Glucose-Capillary: 96 mg/dL (ref 70–99)
Glucose-Capillary: 136 mg/dL — ABNORMAL HIGH (ref 70–99)
Glucose-Capillary: 123 mg/dL — ABNORMAL HIGH (ref 70–99)

## 2024-08-02 LAB — BPAM RBC
Blood Product Expiration Date: 202512282359
ISSUE DATE / TIME: 202512181332
Unit Type and Rh: 600

## 2024-08-02 LAB — MAGNESIUM: Magnesium: 2.2 mg/dL (ref 1.7–2.4)

## 2024-08-02 MED ORDER — CARVEDILOL 25 MG PO TABS: 25.0000 mg | ORAL_TABLET | Freq: Two times a day (BID) | ORAL | Status: DC

## 2024-08-02 MED ORDER — TORSEMIDE 20 MG PO TABS
40.0000 mg | ORAL_TABLET | Freq: Every day | ORAL | Status: DC
  Administered 2024-08-03: 40 mg via ORAL
  Filled 2024-08-02: qty 2

## 2024-08-02 MED ORDER — CARVEDILOL 25 MG PO TABS
25.0000 mg | ORAL_TABLET | Freq: Two times a day (BID) | ORAL | Status: DC
  Administered 2024-08-02 – 2024-08-04 (×5): 25 mg via ORAL
  Filled 2024-08-02 (×5): qty 1

## 2024-08-02 NOTE — TOC Progression Note (Addendum)
 Transition of Care (TOC) - Progression Note    Patient Details  Name: Ricardo Mayer. MRN: [id number] Date of Birth: 1941/03/04  Transition of Care [facility]) CM/SW Contact  Waddell Barnie Rama, RN Phone Number: 08/02/2024, 2:51 PM  Clinical Narrative:    NCM  called patient room, son answered , he states his mom has [facility] services with [facility] and he would like for his Dad to have [facility] as well.  NCM sent referral thru portal.  They have accepted referral for HHRN, HHPT, soc will begin 24 to 48 hrs post dc. Patient has a scale at home and a bp cuff.                      Expected Discharge Plan and Services                                               Social Drivers of Health (SDOH) Interventions SDOH Screenings   Food Insecurity: No Food Insecurity (07/31/2024)  Housing: Low Risk (07/31/2024)  Transportation Needs: No Transportation Needs (07/31/2024)  Utilities: Not At Risk (07/31/2024)  Alcohol Screen: Low Risk (08/20/2023)  Depression (PHQ2-9): High Risk (07/30/2024)  Financial Resource Strain: Low Risk (08/20/2023)  Physical Activity: Sufficiently Active (08/20/2023)  Social Connections: Socially Integrated (07/31/2024)  Stress: No Stress Concern Present (08/20/2023)  Tobacco Use: Low Risk (07/31/2024)  Health Literacy: Adequate Health Literacy (08/21/2023)    Readmission Risk Interventions    08/01/2024   11:37 AM  Readmission Risk Prevention Plan  Transportation Screening Complete  PCP or Specialist Appt within 5-7 Days Complete  Home Care Screening Complete  Medication Review (RN CM) Complete

## 2024-08-02 NOTE — Progress Notes (Signed)
 " PROGRESS NOTE  Ricardo Mayer. 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  Underwent an echo by PCP on 07/25/2024 which showed EF of 30-35%.  Imaging in the ED showed CHF, elevated proBNP and elevated troponin.  Cardiology consulted  Subjective / 24h Interval events: Feels well today, denies any chest pain, denies any shortness of breath.  Appreciates the swelling has improved.  Has been up and walking much other than in the room  Assesement and Plan: Principal problem Acute systolic CHF -recent echo showed EF 35 to 35%.  Has been placed on furosemide , continues to have excellent output, net negative almost 4 L this morning.  Weight is improved from 230 pounds on admission to 215 this morning  - Cardiology consulted, appreciate input, discussed with Dr. Floretta today, continue IV Lasix  x 1 this morning and transition to oral tomorrow  Active problem Elevated troponin-no chest pain, likely demand ischemia.  Continue statin  CKD 5 -most recent creatinine as an outpatient in August 2025 was 5.5.  At 5.0 on admission, creatinine slightly up to 5.8 today, due to diuresis.  Convert to p.o. torsemide  tomorrow as per cardiology.  Making good urine  PAF ruled out -was concerned about patient having new onset A-fib on 12/18, but on further evaluation cardiology does not think that was accurate.  Anemia-of chronic disease, also due to underlying CKD.  Hemoglobin dipped below 8, transfuse unit of packed red blood cells given underlying cardiac issues.  Hemoglobin improved appropriately and is remaining stable  Hyperlipidemia-continue statin  Hypertension-continue amlodipine , doxazosin .  Blood pressure  controlled  DM2-not on medications, continue sliding scale.  CBG (last 3)  Recent Labs    08/01/24 1640 08/01/24 2117 08/02/24 0606  GLUCAP 108* 119* 80    Lab Results  Component Value Date   HGBA1C 5.2 07/31/2024   Obesity, class I -BMI is 36 however he has a degree of fluid overload  Scheduled Meds:  amLODipine   5 mg Oral Daily   aspirin   325 mg Oral Daily   atorvastatin   10 mg Oral Daily   carvedilol   25 mg Oral BID WC   doxazosin   1 mg Oral QHS   hydrALAZINE   25 mg Oral Q8H   insulin  aspart  0-6 Units Subcutaneous TID WC   lidocaine   1 patch Transdermal Q24H   [START ON 08/03/2024] torsemide   40 mg Oral Daily   Continuous Infusions:  ferric gluconate (FERRLECIT ) IVPB Stopped (08/01/24 1235)   PRN Meds:.acetaminophen  **OR** acetaminophen   Current Outpatient Medications  Medication Instructions   albuterol  (VENTOLIN  HFA) 108 (90 Base) MCG/ACT inhaler 2 puffs, Inhalation, Every 6 hours PRN   amLODipine  (NORVASC ) 5 mg, Oral, Daily   aspirin  325 mg, Oral, Daily   atorvastatin  (LIPITOR) 10 mg, Oral, Daily   azelastine  (ASTELIN ) 0.1 % nasal spray 1 spray, Each Nare, 2 times daily, For runny nose   benzonatate  (TESSALON ) 100 mg, Oral, 2 times daily PRN   cholecalciferol (VITAMIN D3) 1,000 Units, Daily   doxazosin  (CARDURA ) 1 mg, Daily at bedtime   doxycycline  (ADOXA) 100 mg, Oral, 2 times daily   furosemide  (LASIX ) 20 MG tablet TAKE 1 AND 1/2 TABLETS BY MOUTH  DAILY AS NEEDED  FOR SWELLING   Lokelma  10 g, Oral, Daily   loratadine  (CLARITIN ) 10 MG tablet Take 1 tablet EVERY OTHER day.  RENAL DOSING!!! Please reiterate this.   olmesartan  (BENICAR ) 40 mg, Oral, Daily    Diet Orders (From admission, onward)     Start     Ordered   07/31/24 0419  Diet renal/carb modified with fluid restriction Diet-HS Snack? Nothing; Fluid restriction: 1200 mL Fluid; Room service appropriate? Yes; Fluid consistency: Thin  Diet effective now       Question Answer Comment  Diet-HS Snack?  Nothing   Fluid restriction: 1200 mL Fluid   Room service appropriate? Yes   Fluid consistency: Thin      07/31/24 0419            DVT prophylaxis:    Lab Results  Component Value Date   PLT 175 08/02/2024      Code Status: Full Code  Family Communication: Son present at bedside  Status is: Inpatient Remains inpatient appropriate because: severity of illness  Level of care: Progressive  Consultants:  Cardiology   Objective: Vitals:   08/01/24 2300 08/02/24 0300 08/02/24 0705 08/02/24 0740  BP: (!) 143/59 132/68 (!) 143/78 137/68  Pulse: 86 83 80 85  Resp: 14 20 19  (!) 23  Temp: 98.1 F (36.7 C) 97.7 F (36.5 C)  97.8 F (36.6 C)  TempSrc: Oral Oral  Oral  SpO2: 98% 100%  99%  Weight:  97.6 kg    Height:  5' 7 (1.702 m)      Intake/Output Summary (Last 24 hours) at 08/02/2024 9076 Last data filed at 08/02/2024 0700 Gross per 24 hour  Intake 1359 ml  Output 2175 ml  Net -816 ml   Wt Readings from Last 3 Encounters:  08/02/24 97.6 kg  07/30/24 105.2 kg  07/25/24 105.2 kg    Examination:  Constitutional: NAD Eyes: lids and conjunctivae normal, no scleral icterus ENMT: mmm Neck: normal, supple Respiratory: clear to auscultation bilaterally, no wheezing, no crackles. Normal respiratory effort.  Cardiovascular: Regular rate and rhythm, no murmurs / rubs / gallops. No LE edema.  Chronic venous stasis changes bilateral lower extremities Abdomen: soft, no distention, no tenderness. Bowel sounds positive.   Data Reviewed: I have independently reviewed following labs and imaging studies   CBC Recent Labs  Lab 07/31/24 0511 07/31/24 0819 08/01/24 0232 08/01/24 1806 08/02/24 0252  WBC 7.6 7.5 7.1 7.6 7.3  HGB 8.0* 8.2* 7.6* 8.8* 8.4*  HCT 25.2* 26.2* 23.5* 26.3* 25.6*  PLT 184 207 185 169 175  MCV 97.7 96.7 94.8 94.6 93.4  MCH 31.0 30.3 30.6 31.7 30.7  MCHC 31.7 31.3 32.3 33.5 32.8  RDW 15.1 15.0 14.6 14.9 14.9  LYMPHSABS 1.4 1.8  --   --    --   MONOABS 0.8 0.5  --   --   --   EOSABS 0.1 0.1  --   --   --   BASOSABS 0.0 0.0  --   --   --     Recent Labs  Lab 07/30/24 1800 07/31/24 0046 07/31/24 0500 07/31/24 0511 08/01/24 0232 08/02/24 0252  NA 140  --   --  139 137 136  K 4.7  --   --  4.6 4.5 5.0  CL 106  --   --  106 105 103  CO2 18*  --   --  19* 18* 19*  GLUCOSE 89  --   --  86 90 89  BUN 63*  --   --  61* 67* 79*  CREATININE 5.07*  --   --  5.05* 5.47* 5.86*  CALCIUM  9.4  --   --  9.1 8.6* 8.7*  AST  --   --   --  25 22  --   ALT  --   --   --  15 12  --   ALKPHOS  --   --   --  72 62  --   BILITOT  --   --   --  0.4 0.3  --   ALBUMIN  --   --   --  3.7 3.3*  --   MG  --   --   --  2.3 2.2 2.2  TSH  --  1.670  --   --   --   --   HGBA1C  --   --  5.2  --   --   --     ------------------------------------------------------------------------------------------------------------------ No results for input(s): CHOL, HDL, LDLCALC, TRIG, CHOLHDL, LDLDIRECT in the last 72 hours.  Lab Results  Component Value Date   HGBA1C 5.2 07/31/2024   ------------------------------------------------------------------------------------------------------------------ Recent Labs    07/31/24 0046  TSH 1.670    Cardiac Enzymes No results for input(s): CKMB, TROPONINI, MYOGLOBIN in the last 168 hours.  Invalid input(s): CK ------------------------------------------------------------------------------------------------------------------    Component Value Date/Time   BNP 105.2 (H) 05/29/2015 1115    CBG: Recent Labs  Lab 08/01/24 0545 08/01/24 1108 08/01/24 1640 08/01/24 2117 08/02/24 0606  GLUCAP 85 110* 108* 119* 80    No results found for this or any previous visit (from the past 240 hours).   Radiology Studies: No results found.    Nilda Fendt, MD, PhD [facility]  Between 7 am - 7 pm I am available, please contact me via Amion (for emergencies) or Securechat (non  urgent messages)  Between 7 pm - 7 am I am not available, please contact night coverage MD/APP via Amion  "

## 2024-08-02 NOTE — Progress Notes (Signed)
 "  Rounding Note   Patient Name: Ricardo Mayer. Date of Encounter: 08/02/2024  [facility] Cardiologist: None   Subjective - No acute events overnight - Patient says that he feels much better and no longer has DOE or orthopnea  Scheduled Meds:  amLODipine   5 mg Oral Daily   aspirin   325 mg Oral Daily   atorvastatin   10 mg Oral Daily   carvedilol   25 mg Oral BID WC   doxazosin   1 mg Oral QHS   hydrALAZINE   25 mg Oral Q8H   insulin  aspart  0-6 Units Subcutaneous TID WC   lidocaine   1 patch Transdermal Q24H   [START ON 08/03/2024] torsemide   40 mg Oral Daily   Continuous Infusions:  ferric gluconate (FERRLECIT ) IVPB Stopped (08/01/24 1235)   PRN Meds: acetaminophen  **OR** acetaminophen    Vital Signs  Vitals:   08/01/24 2300 08/02/24 0300 08/02/24 0705 08/02/24 0740  BP: (!) 143/59 132/68 (!) 143/78 137/68  Pulse: 86 83 80 85  Resp: 14 20 19  (!) 23  Temp: 98.1 F (36.7 C) 97.7 F (36.5 C)  97.8 F (36.6 C)  TempSrc: Oral Oral  Oral  SpO2: 98% 100%  99%  Weight:  97.6 kg    Height:  5' 7 (1.702 m)      Intake/Output Summary (Last 24 hours) at 08/02/2024 0916 Last data filed at 08/02/2024 0700 Gross per 24 hour  Intake 1359 ml  Output 2175 ml  Net -816 ml      08/02/2024    3:00 AM 08/01/2024    6:22 AM 08/01/2024    3:00 AM  Last 3 Weights  Weight (lbs) 215 lb 2.7 oz 216 lb 11.4 oz 223 lb 12.3 oz  Weight (kg) 97.6 kg 98.3 kg 101.5 kg      Telemetry Atrial fibrillation? - Personally Reviewed  ECG  Atrial fibrillation? - Personally Reviewed  Physical Exam  GEN: No acute distress, very pleasant Neck: Mild distention of EJ Cardiac: RRR, no murmurs, rubs, or gallops.  Respiratory: CTAB, no wheezes, rales or rhonchi GI: Soft, nontender, non-distended  MS: Trace bilateral edema; No deformity. Neuro:  Nonfocal  Psych: Normal affect   Labs High Sensitivity Troponin:  No results for input(s): TROPONINIHS in the last 720 hours.  Recent  Labs  Lab 07/30/24 1800 07/30/24 2346  TRNPT 110* 105*       Chemistry Recent Labs  Lab 07/31/24 0511 08/01/24 0232 08/02/24 0252  NA 139 137 136  K 4.6 4.5 5.0  CL 106 105 103  CO2 19* 18* 19*  GLUCOSE 86 90 89  BUN 61* 67* 79*  CREATININE 5.05* 5.47* 5.86*  CALCIUM  9.1 8.6* 8.7*  MG 2.3 2.2 2.2  PROT 5.7* 5.2*  --   ALBUMIN 3.7 3.3*  --   AST 25 22  --   ALT 15 12  --   ALKPHOS 72 62  --   BILITOT 0.4 0.3  --   GFRNONAA 11* 10* 9*  ANIONGAP 14 14 14     Lipids No results for input(s): CHOL, TRIG, HDL, LABVLDL, LDLCALC, CHOLHDL in the last 168 hours.  Hematology Recent Labs  Lab 08/01/24 0232 08/01/24 1806 08/02/24 0252  WBC 7.1 7.6 7.3  RBC 2.48* 2.78* 2.74*  HGB 7.6* 8.8* 8.4*  HCT 23.5* 26.3* 25.6*  MCV 94.8 94.6 93.4  MCH 30.6 31.7 30.7  MCHC 32.3 33.5 32.8  RDW 14.6 14.9 14.9  PLT 185 169 175   Thyroid   Recent Labs  Lab 07/31/24 0046  TSH 1.670    BNP Recent Labs  Lab 07/30/24 1800  PROBNP >35,000.0*    DDimer No results for input(s): DDIMER in the last 168 hours.   Radiology  No results found.   Cardiac Studies    TTE 07/25/24:  IMPRESSIONS     1. Left ventricular ejection fraction, by estimation, is 30 to 35%. The  left ventricle has moderately decreased function. Left ventricular  endocardial border not optimally defined to evaluate regional wall motion.  There is mild left ventricular  hypertrophy. Left ventricular diastolic parameters are consistent with  Grade I diastolic dysfunction (impaired relaxation).   2. Right ventricular systolic function is normal. The right ventricular  size is normal. Tricuspid regurgitation signal is inadequate for assessing  PA pressure.   3. The mitral valve is abnormal. Mild mitral valve regurgitation. Mild  mitral stenosis. The mean mitral valve gradient is 3.0 mmHg. Severe mitral  annular calcification.   4. 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 Calcified and  restricted  right coronary cusp. Moderate restriction in remaining leaflet mobility.  Aortic valve regurgitation is moderate. Mild aortic valve stenosis based  on gradients but likely moderate to  severe aortic valve stenosis based on leaflet mobility. Conisder low  flow-low gradient AS. Aortic regurgitation PHT measures 252 msec. Aortic  valve area, by VTI measures 1.77 cm. Aortic valve mean gradient measures  17.0 mmHg. Aortic valve Vmax measures  2.76 m/s.   5. Aortic dilatation noted. There is mild dilatation of the aortic root,  measuring 40 mm. There is mild dilatation of the ascending aorta,  measuring 43 mm.   Patient Profile   [name] is a 83 y.o. 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  Cardiology is consulted to evaluate CHF exacerbation.  Assessment & Plan    #Acute Decompensated HFrEF Exacerbation (EF = 30-35%) :: The patient presented with ongoing symptoms of weight gain, peripheral edema and shortness of breath all consistent with decompensated CHF.  He has a newly reduced EF to 30 to 35% found by his PCP on outpatient echocardiography.  Unclear if this is an ischemic or nonischemic cardiomyopathy; however, his renal function is severely reduced and thus he is not a candidate for left heart catheterization at this time.  Furthermore, his renal insufficiency makes the initiation of GDMT challenging.  He will need aggressive diuresis to improve his volume status and additional evaluation will depend on his renal function.  I think that he is very close to being euvolemic and can likely transition to an oral diuretic regiment tomorrow. Give 80 IV Lasix  x1 today Transition to p.o. torsemide  40 mg daily tomorrow (already ordered) Increase carvedilol  to 25 mg twice daily Continue hydralazine  25 mg every 8 hours Currently ACEI/ARB/ARNI,  MRA and SGLT2 are contraindicated due to his renal function Ideally would like to do an LHC but this is contraindicated given his renal dysfunction. May consider a stress test as an outpatient. Strict I's and O's Daily weights  #No Atrial Fibrillation :: Yesterday the patient had a slightly irregular heart rhythm that initially appeared to be atrial fibrillation.  I obtained a twelve-lead EKG which actually revealed sinus rhythm with sinus arrhythmia and a long PR interval.  Repeat EKG today clearly demonstrates sinus rhythm as well.  At this time  there is no evidence that the patient has atrial fibrillation.   #HTN Continue hydralazine  as described above Continue amlodipine  5 mg daily for now Increase carvedilol  as above   #HLD :: Recent LDL of 17 as an outpatient.  Will continue current regiment. Continue atorvastatin  10 mg daily  #Iron Deficiency Anemia :: Transferrin saturation <20% and ferritin <200.  No overt signs of bleeding despite drop in hemoglobin. Continue IV ferric gluconate 125 mg x7 days  #AKI on CKD :: Markedly elevated serum creatinine on admission consistent with AKI on CKD.  At least in part this is related to cardiorenal syndrome.  I expect his renal function to improve somewhat with diuresis, but his renal function has not been completely normal for some time.  Will avoid nephrotoxic drugs and continue diuresis. Continue diuresis as above Strict I's and O's       For questions or updates, please contact [facility] Please consult [email or web address] for contact info under       Signed, Ricardo Archer, MD  08/02/2024, 9:16 AM    "

## 2024-08-02 NOTE — Plan of Care (Signed)
   Problem: Coping: Goal: Ability to adjust to condition or change in health will improve Outcome: Progressing   Problem: Fluid Volume: Goal: Ability to maintain a balanced intake and output will improve Outcome: Progressing

## 2024-08-02 NOTE — Evaluation (Signed)
 Physical Therapy Evaluation Patient Details Name: Ricardo Mayer. MRN: [id number] DOB: Jan 27, 1941 Today's Date: 08/02/2024  History of Present Illness  83 y.o. male presents to [facility] 07/30/24 with 20 lb weight gain, SOB and LE edema. New acute systolic CHF. PMHx: CKD stage 5, PAF, anemia, HTN, DM2  Clinical Impression  Prior to admittance, pt mod I with mobility, utilizing RW for ambulation and was indep with ADLs. Pt presents to evaluation with deficits in mobility, strength, power, activity tolerance, and balance. Pt was able to ambulate with AD and no physical assistance (see gait section). Discussed stair negotiation with pt and pt's son, with pt verbalizing conformability with their completion and does not feel he needs to practice them. Pt was encouraged to have family member closeby for first several attempts for optimal safety. PT will continue to treat pt while he is admitted. Recommending HHPT at discharge to address remaining mobility deficits and optimize balance to reduce risk of falls.          If plan is discharge home, recommend the following: A little help with bathing/dressing/bathroom;Assist for transportation;Help with stairs or ramp for entrance   Can travel by private vehicle        Equipment Recommendations None recommended by PT  Recommendations for Other Services       Functional Status Assessment Patient has had a recent decline in their functional status and demonstrates the ability to make significant improvements in function in a reasonable and predictable amount of time.     Precautions / Restrictions Precautions Precautions: Fall Recall of Precautions/Restrictions: Intact Restrictions Weight Bearing Restrictions Per Provider Order: No      Mobility  Bed Mobility Overal bed mobility: Needs Assistance Bed Mobility: Supine to Sit, Sit to Supine     Supine to sit: Supervision Sit to supine: Supervision   General bed mobility comments: increased  time to complete    Transfers Overall transfer level: Needs assistance Equipment used: Rolling walker (2 wheels) Transfers: Sit to/from Stand Sit to Stand: Supervision           General transfer comment: STS from EOB x2 with RW and closeby supervision. VC given for sequencing; increased time to complete.    Ambulation/Gait Ambulation/Gait assistance: Supervision Gait Distance (Feet): 300 Feet Assistive device: Rolling walker (2 wheels) Gait Pattern/deviations: Step-through pattern, Decreased stride length, Decreased dorsiflexion - right, Trunk flexed Gait velocity: reduced Gait velocity interpretation: <1.8 ft/sec, indicate of risk for recurrent falls   General Gait Details: Pt demonstrates reciprocal gait pattern with increased reliance on RW for stability. As gait progresses, pt's R ankle DF fatigue and pt demonstrates R foot drag. With VC, pt is able to increased R ankle DF and R knee flexion, but again fatigues quickly resulting in drag.  Stairs Stairs:  (discussed with pt and pt's son with pt reporting confidence with negotiation. pt encouraged to navigate with 1 person closeby at home for safety, for first several attempts.)          Wheelchair Mobility     Tilt Bed    Modified Rankin (Stroke Patients Only)       Balance Overall balance assessment: Needs assistance Sitting-balance support: No upper extremity supported, Feet supported Sitting balance-Leahy Scale: Good Sitting balance - Comments: seated EOB for MMT   Standing balance support: Bilateral upper extremity supported, During functional activity, Reliant on assistive device for balance Standing balance-Leahy Scale: Poor Standing balance comment: reliant on external support  Pertinent Vitals/Pain Pain Assessment Pain Assessment: No/denies pain    Home Living Family/patient expects to be discharged to:: Private residence Living Arrangements:  Spouse/significant other Available Help at Discharge: Family;Available PRN/intermittently (may discharge to either sons' homes) Type of Home: House Home Access: Stairs to enter Entrance Stairs-Rails: Right Entrance Stairs-Number of Steps: 3 Alternate Level Stairs-Number of Steps: 12 Home Layout: Multi-level;Full bath on main level;Able to live on main level with bedroom/bathroom Home Equipment: Cane - single point;Rolling Walker (2 wheels);Tub bench Additional Comments: may discharge to either sons' homes in which they are a level entry and both have bedrooms and full bath on main level. walkin shower. standard height toilet.    Prior Function Prior Level of Function : Independent/Modified Independent;Driving             Mobility Comments: mod I with RW ADLs Comments: indep     Extremity/Trunk Assessment   Upper Extremity Assessment Upper Extremity Assessment: Generalized weakness    Lower Extremity Assessment Lower Extremity Assessment: Generalized weakness    Cervical / Trunk Assessment Cervical / Trunk Assessment: Kyphotic  Communication   Communication Communication: No apparent difficulties    Cognition Arousal: Alert Behavior During Therapy: WFL for tasks assessed/performed   PT - Cognitive impairments: No apparent impairments                         Following commands: Intact       Cueing Cueing Techniques: Verbal cues     General Comments General comments (skin integrity, edema, etc.): VSS    Exercises     Assessment/Plan    PT Assessment Patient needs continued PT services  PT Problem List Decreased strength;Decreased range of motion;Decreased activity tolerance;Decreased balance;Decreased mobility;Decreased coordination;Decreased knowledge of use of DME       PT Treatment Interventions DME instruction;Gait training;Stair training;Functional mobility training;Therapeutic activities;Therapeutic exercise;Balance training;Patient/family  education;Wheelchair mobility training;Manual techniques    PT Goals (Current goals can be found in the Care Plan section)  Acute Rehab PT Goals Patient Stated Goal: to go home PT Goal Formulation: With patient/family Time For Goal Achievement: 08/16/24 Potential to Achieve Goals: Fair    Frequency Min 1X/week     Co-evaluation               AM-PAC PT 6 Clicks Mobility  Outcome Measure Help needed turning from your back to your side while in a flat bed without using bedrails?: A Little Help needed moving from lying on your back to sitting on the side of a flat bed without using bedrails?: A Little Help needed moving to and from a bed to a chair (including a wheelchair)?: A Little Help needed standing up from a chair using your arms (e.g., wheelchair or bedside chair)?: A Little Help needed to walk in hospital room?: A Little Help needed climbing 3-5 steps with a railing? : Total 6 Click Score: 16    End of Session Equipment Utilized During Treatment: Gait belt Activity Tolerance: Patient tolerated treatment well Patient left: in bed;with call bell/phone within reach;with family/visitor present Nurse Communication: Mobility status PT Visit Diagnosis: Other abnormalities of gait and mobility (R26.89);Muscle weakness (generalized) (M62.81)    Time: 9174-9153 PT Time Calculation (min) (ACUTE ONLY): 21 min   Charges:   PT Evaluation $PT Eval Low Complexity: 1 Low   PT General Charges $$ ACUTE PT VISIT: 1 Visit         Leontine Hilt DPT Acute Rehab Services [phone] Prefer contact  via chat   Dragon Thrush B Khamarion Bjelland 08/02/2024, 10:36 AM

## 2024-08-03 DIAGNOSIS — I10 Essential (primary) hypertension: Secondary | ICD-10-CM

## 2024-08-03 DIAGNOSIS — I359 Nonrheumatic aortic valve disorder, unspecified: Secondary | ICD-10-CM

## 2024-08-03 DIAGNOSIS — E782 Mixed hyperlipidemia: Secondary | ICD-10-CM | POA: Diagnosis not present

## 2024-08-03 DIAGNOSIS — I5021 Acute systolic (congestive) heart failure: Secondary | ICD-10-CM | POA: Diagnosis not present

## 2024-08-03 DIAGNOSIS — N179 Acute kidney failure, unspecified: Secondary | ICD-10-CM

## 2024-08-03 LAB — BASIC METABOLIC PANEL WITH GFR
Anion gap: 14 (ref 5–15)
BUN: 85 mg/dL — ABNORMAL HIGH (ref 8–23)
CO2: 19 mmol/L — ABNORMAL LOW (ref 22–32)
Calcium: 8.7 mg/dL — ABNORMAL LOW (ref 8.9–10.3)
Chloride: 101 mmol/L (ref 98–111)
Creatinine, Ser: 5.74 mg/dL — ABNORMAL HIGH (ref 0.61–1.24)
GFR, Estimated: 9 mL/min — ABNORMAL LOW
Glucose, Bld: 86 mg/dL (ref 70–99)
Potassium: 4.9 mmol/L (ref 3.5–5.1)
Sodium: 134 mmol/L — ABNORMAL LOW (ref 135–145)

## 2024-08-03 LAB — CBC
HCT: 27.2 % — ABNORMAL LOW (ref 39.0–52.0)
Hemoglobin: 9 g/dL — ABNORMAL LOW (ref 13.0–17.0)
MCH: 30.7 pg (ref 26.0–34.0)
MCHC: 33.1 g/dL (ref 30.0–36.0)
MCV: 92.8 fL (ref 80.0–100.0)
Platelets: 177 K/uL (ref 150–400)
RBC: 2.93 MIL/uL — ABNORMAL LOW (ref 4.22–5.81)
RDW: 14.6 % (ref 11.5–15.5)
WBC: 8.5 K/uL (ref 4.0–10.5)
nRBC: 0 % (ref 0.0–0.2)

## 2024-08-03 LAB — GLUCOSE, CAPILLARY
Glucose-Capillary: 84 mg/dL (ref 70–99)
Glucose-Capillary: 117 mg/dL — ABNORMAL HIGH (ref 70–99)
Glucose-Capillary: 102 mg/dL — ABNORMAL HIGH (ref 70–99)
Glucose-Capillary: 113 mg/dL — ABNORMAL HIGH (ref 70–99)

## 2024-08-03 LAB — MAGNESIUM: Magnesium: 2.2 mg/dL (ref 1.7–2.4)

## 2024-08-03 MED ORDER — ISOSORBIDE MONONITRATE ER 30 MG PO TB24
15.0000 mg | ORAL_TABLET | Freq: Every day | ORAL | Status: DC
  Administered 2024-08-03: 15 mg via ORAL
  Filled 2024-08-03: qty 1

## 2024-08-03 MED ORDER — ASPIRIN 81 MG PO TBEC
81.0000 mg | DELAYED_RELEASE_TABLET | Freq: Every day | ORAL | Status: DC
  Administered 2024-08-04: 81 mg via ORAL
  Filled 2024-08-03 (×2): qty 1

## 2024-08-03 MED ORDER — HYDROCODONE-ACETAMINOPHEN 5-325 MG PO TABS
1.0000 | ORAL_TABLET | Freq: Four times a day (QID) | ORAL | Status: DC | PRN
  Administered 2024-08-03: 1 via ORAL
  Filled 2024-08-03: qty 1

## 2024-08-03 MED ORDER — HYDROCODONE-ACETAMINOPHEN 5-325 MG PO TABS
1.0000 | ORAL_TABLET | ORAL | Status: DC | PRN
  Administered 2024-08-03 – 2024-08-04 (×3): 1 via ORAL
  Filled 2024-08-03 (×3): qty 1

## 2024-08-03 NOTE — Progress Notes (Signed)
 " PROGRESS NOTE  Ricardo Mayer. FMW:[id number] DOB: 29-Aug-1940 DOA: 07/30/2024 PCP: [name], DO   LOS: 3 days   Brief Narrative / Interim history: 83 year old male with CKD 5 on chronic furosemide  40 mg BID for the past 2 months comes into the hospital with 20 pound weight gain, increased shortness of breath with exertion and lower extremity swelling.  Underwent an echo by PCP on 07/25/2024 which showed EF of 30-35%.  Imaging in the ED showed CHF, elevated proBNP and elevated troponin.  Cardiology consulted  Subjective / 24h Interval events: Doing well today, breathing better  Assesement and Plan: Principal problem Acute systolic CHF -recent echo showed EF 35 to 35%.  Has been placed on furosemide , continues to have excellent output, weight improved to 211 pounds from 230 on admission. - Cardiology consulted, following, transitioning to oral diuretics today.  If he still responds well consider discharge home tomorrow - Goal-directed medical therapy limited due to his renal failure - Started Imdur  and amlodipine  was discontinued today  Active problem Elevated troponin-likely demand ischemia.  Continue statin.  No chest pain  CKD 5 -most recent creatinine as an outpatient in August 2025 was 5.5.  At 5.0 on admission, creatinine slightly up to 5.8 today, due to diuresis.  Converted to p.o. torsemide  today as per cardiology.  PAF ruled out -was concerned about patient having new onset A-fib on 12/18, but on further evaluation cardiology does not think that was accurate.  Anemia-of chronic disease, also due to underlying CKD.  Hemoglobin dipped below 8, transfuse unit of packed red blood cells given underlying cardiac issues.  Hemoglobin improved appropriately and is remaining stable  Hyperlipidemia-continue statin  Hypertension-amlodipine  discontinued, added Imdur , he is on Coreg  as well as hydralazine .  Blood pressure is controlled  DM2-not on medications, continue sliding  scale.  CBG (last 3)  Recent Labs    08/02/24 1651 08/02/24 2136 08/03/24 0625  GLUCAP 136* 123* 84    Lab Results  Component Value Date   HGBA1C 5.2 07/31/2024   Obesity, class I -BMI is 36 however he has a degree of fluid overload  Scheduled Meds:  aspirin  EC  81 mg Oral Daily   atorvastatin   10 mg Oral Daily   carvedilol   25 mg Oral BID WC   doxazosin   1 mg Oral QHS   hydrALAZINE   25 mg Oral Q8H   insulin  aspart  0-6 Units Subcutaneous TID WC   isosorbide  mononitrate  15 mg Oral Daily   lidocaine   1 patch Transdermal Q24H   torsemide   40 mg Oral Daily   Continuous Infusions:  ferric gluconate (FERRLECIT ) IVPB 125 mg (08/03/24 0943)   PRN Meds:.acetaminophen  **OR** acetaminophen   Current Outpatient Medications  Medication Instructions   albuterol  (VENTOLIN  HFA) 108 (90 Base) MCG/ACT inhaler 2 puffs, Inhalation, Every 6 hours PRN   amLODipine  (NORVASC ) 5 mg, Oral, Daily   aspirin  325 mg, Oral, Daily   atorvastatin  (LIPITOR) 10 mg, Oral, Daily   azelastine  (ASTELIN ) 0.1 % nasal spray 1 spray, Each Nare, 2 times daily, For runny nose   benzonatate  (TESSALON ) 100 mg, Oral, 2 times daily PRN   cholecalciferol (VITAMIN D3) 1,000 Units, Daily   doxazosin  (CARDURA ) 1 mg, Daily at bedtime   doxycycline  (ADOXA) 100 mg, Oral, 2 times daily   furosemide  (LASIX ) 20 MG tablet TAKE 1 AND 1/2 TABLETS BY MOUTH  DAILY AS NEEDED FOR SWELLING   Lokelma  10 g, Oral, Daily   loratadine  (CLARITIN ) 10 MG  tablet Take 1 tablet EVERY OTHER day.  RENAL DOSING!!! Please reiterate this.   olmesartan  (BENICAR ) 40 mg, Oral, Daily    Diet Orders (From admission, onward)     Start     Ordered   07/31/24 0419  Diet renal/carb modified with fluid restriction Diet-HS Snack? Nothing; Fluid restriction: 1200 mL Fluid; Room service appropriate? Yes; Fluid consistency: Thin  Diet effective now       Question Answer Comment  Diet-HS Snack? Nothing   Fluid restriction: 1200 mL Fluid   Room service  appropriate? Yes   Fluid consistency: Thin      07/31/24 0419            DVT prophylaxis:    Lab Results  Component Value Date   PLT 177 08/03/2024      Code Status: Full Code  Family Communication: Son present at bedside  Status is: Inpatient Remains inpatient appropriate because: severity of illness  Level of care: Telemetry  Consultants:  Cardiology   Objective: Vitals:   08/03/24 0337 08/03/24 0802 08/03/24 0804 08/03/24 0807  BP: 127/81 (!) 104/54    Pulse: 84 76 76   Resp: 20 15  15   Temp: 97.6 F (36.4 C) 97.8 F (36.6 C)    TempSrc: Oral Oral    SpO2: 98% 99%    Weight: 96 kg     Height:        Intake/Output Summary (Last 24 hours) at 08/03/2024 1055 Last data filed at 08/03/2024 0935 Gross per 24 hour  Intake 480 ml  Output 1500 ml  Net -1020 ml   Wt Readings from Last 3 Encounters:  08/03/24 96 kg  07/30/24 105.2 kg  07/25/24 105.2 kg    Examination:  Constitutional: NAD Eyes: lids and conjunctivae normal, no scleral icterus ENMT: mmm Neck: normal, supple Respiratory: clear to auscultation bilaterally, no wheezing, no crackles. Normal respiratory effort.  Cardiovascular: Regular rate and rhythm, no murmurs / rubs / gallops. No LE edema. Abdomen: soft, no distention, no tenderness. Bowel sounds positive.   Data Reviewed: I have independently reviewed following labs and imaging studies   CBC Recent Labs  Lab 07/31/24 0511 07/31/24 0819 08/01/24 0232 08/01/24 1806 08/02/24 0252 08/03/24 0221  WBC 7.6 7.5 7.1 7.6 7.3 8.5  HGB 8.0* 8.2* 7.6* 8.8* 8.4* 9.0*  HCT 25.2* 26.2* 23.5* 26.3* 25.6* 27.2*  PLT 184 207 185 169 175 177  MCV 97.7 96.7 94.8 94.6 93.4 92.8  MCH 31.0 30.3 30.6 31.7 30.7 30.7  MCHC 31.7 31.3 32.3 33.5 32.8 33.1  RDW 15.1 15.0 14.6 14.9 14.9 14.6  LYMPHSABS 1.4 1.8  --   --   --   --   MONOABS 0.8 0.5  --   --   --   --   EOSABS 0.1 0.1  --   --   --   --   BASOSABS 0.0 0.0  --   --   --   --      Recent Labs  Lab 07/30/24 1800 07/31/24 0046 07/31/24 0500 07/31/24 0511 08/01/24 0232 08/02/24 0252 08/03/24 0221  NA 140  --   --  139 137 136 134*  K 4.7  --   --  4.6 4.5 5.0 4.9  CL 106  --   --  106 105 103 101  CO2 18*  --   --  19* 18* 19* 19*  GLUCOSE 89  --   --  86 90 89 86  BUN 63*  --   --  61* 67* 79* 85*  CREATININE 5.07*  --   --  5.05* 5.47* 5.86* 5.74*  CALCIUM  9.4  --   --  9.1 8.6* 8.7* 8.7*  AST  --   --   --  25 22  --   --   ALT  --   --   --  15 12  --   --   ALKPHOS  --   --   --  72 62  --   --   BILITOT  --   --   --  0.4 0.3  --   --   ALBUMIN  --   --   --  3.7 3.3*  --   --   MG  --   --   --  2.3 2.2 2.2 2.2  TSH  --  1.670  --   --   --   --   --   HGBA1C  --   --  5.2  --   --   --   --     ------------------------------------------------------------------------------------------------------------------ No results for input(s): CHOL, HDL, LDLCALC, TRIG, CHOLHDL, LDLDIRECT in the last 72 hours.  Lab Results  Component Value Date   HGBA1C 5.2 07/31/2024   ------------------------------------------------------------------------------------------------------------------ No results for input(s): TSH, T4TOTAL, T3FREE, THYROIDAB in the last 72 hours.  Invalid input(s): FREET3   Cardiac Enzymes No results for input(s): CKMB, TROPONINI, MYOGLOBIN in the last 168 hours.  Invalid input(s): CK ------------------------------------------------------------------------------------------------------------------    Component Value Date/Time   BNP 105.2 (H) 05/29/2015 1115    CBG: Recent Labs  Lab 08/02/24 0606 08/02/24 1143 08/02/24 1651 08/02/24 2136 08/03/24 0625  GLUCAP 80 96 136* 123* 84    No results found for this or any previous visit (from the past 240 hours).   Radiology Studies: No results found.    Nilda Fendt, MD, PhD [facility]  Between 7 am - 7 pm I am available, please  contact me via Amion (for emergencies) or Securechat (non urgent messages)  Between 7 pm - 7 am I am not available, please contact night coverage MD/APP via Amion  "

## 2024-08-03 NOTE — Progress Notes (Signed)
 "  Rounding Note   Patient Name: Ricardo Mayer. Date of Encounter: 08/03/2024  [facility] Cardiologist: None Dr. Georganna Archer (new)  Subjective Patient seen and examined at bedside. Denies anginal chest pain. Shortness of breath improving. No significant lower extremity swelling. Sons at bedside  Scheduled Meds:  aspirin  EC  81 mg Oral Daily   atorvastatin   10 mg Oral Daily   carvedilol   25 mg Oral BID WC   doxazosin   1 mg Oral QHS   hydrALAZINE   25 mg Oral Q8H   insulin  aspart  0-6 Units Subcutaneous TID WC   isosorbide  mononitrate  15 mg Oral Daily   lidocaine   1 patch Transdermal Q24H   torsemide   40 mg Oral Daily   Continuous Infusions:  ferric gluconate (FERRLECIT ) IVPB 125 mg (08/03/24 0943)   PRN Meds: acetaminophen  **OR** acetaminophen    Vital Signs  Vitals:   08/03/24 0337 08/03/24 0802 08/03/24 0804 08/03/24 0807  BP: 127/81 (!) 104/54    Pulse: 84 76 76   Resp: 20 15  15   Temp: 97.6 F (36.4 C) 97.8 F (36.6 C)    TempSrc: Oral Oral    SpO2: 98% 99%    Weight: 96 kg     Height:        Intake/Output Summary (Last 24 hours) at 08/03/2024 1022 Last data filed at 08/03/2024 0935 Gross per 24 hour  Intake 480 ml  Output 1500 ml  Net -1020 ml   Net IO Since Admission: -5,245.47 mL [08/03/24 1022]     08/03/2024    3:37 AM 08/02/2024    3:00 AM 08/01/2024    6:22 AM  Last 3 Weights  Weight (lbs) 211 lb 10.3 oz 215 lb 2.7 oz 216 lb 11.4 oz  Weight (kg) 96 kg 97.6 kg 98.3 kg      Telemetry Sinus rhythm without ectopy  ECG  No new tracing  Physical Exam  GEN: No acute distress, very pleasant Neck: No JVP. Cardiac: RRR, 3 out of 6 systolic ejection murmur at the second intercostal space, no rubs, or gallops.  Respiratory: Decreased breath sounds bilaterally especially mid to lower lung fields, no rhonchi's or wheezing appreciated GI: Obese, soft, nontender, non-distended  MS: Trace bilateral edema; dry skin, no  deformity. Neuro:  Nonfocal  Psych: Normal affect   Labs High Sensitivity Troponin:  No results for input(s): TROPONINIHS in the last 720 hours.  Recent Labs  Lab 07/30/24 1800 07/30/24 2346  TRNPT 110* 105*       Chemistry Recent Labs  Lab 07/31/24 0511 08/01/24 0232 08/02/24 0252 08/03/24 0221  NA 139 137 136 134*  K 4.6 4.5 5.0 4.9  CL 106 105 103 101  CO2 19* 18* 19* 19*  GLUCOSE 86 90 89 86  BUN 61* 67* 79* 85*  CREATININE 5.05* 5.47* 5.86* 5.74*  CALCIUM  9.1 8.6* 8.7* 8.7*  MG 2.3 2.2 2.2 2.2  PROT 5.7* 5.2*  --   --   ALBUMIN 3.7 3.3*  --   --   AST 25 22  --   --   ALT 15 12  --   --   ALKPHOS 72 62  --   --   BILITOT 0.4 0.3  --   --   GFRNONAA 11* 10* 9* 9*  ANIONGAP 14 14 14 14     Lipids No results for input(s): CHOL, TRIG, HDL, LABVLDL, LDLCALC, CHOLHDL in the last 168 hours.  Hematology Recent Labs  Lab  08/01/24 1806 08/02/24 0252 08/03/24 0221  WBC 7.6 7.3 8.5  RBC 2.78* 2.74* 2.93*  HGB 8.8* 8.4* 9.0*  HCT 26.3* 25.6* 27.2*  MCV 94.6 93.4 92.8  MCH 31.7 30.7 30.7  MCHC 33.5 32.8 33.1  RDW 14.9 14.9 14.6  PLT 169 175 177   Thyroid   Recent Labs  Lab 07/31/24 0046  TSH 1.670    BNP Recent Labs  Lab 07/30/24 1800  PROBNP >35,000.0*    DDimer No results for input(s): DDIMER in the last 168 hours.   Radiology  No results found.   Cardiac Studies    TTE 07/25/24:  IMPRESSIONS     1. Left ventricular ejection fraction, by estimation, is 30 to 35%. The  left ventricle has moderately decreased function. Left ventricular  endocardial border not optimally defined to evaluate regional wall motion.  There is mild left ventricular  hypertrophy. Left ventricular diastolic parameters are consistent with  Grade I diastolic dysfunction (impaired relaxation).   2. Right ventricular systolic function is normal. The right ventricular  size is normal. Tricuspid regurgitation signal is inadequate for assessing  PA  pressure.   3. The mitral valve is abnormal. Mild mitral valve regurgitation. Mild  mitral stenosis. The mean mitral valve gradient is 3.0 mmHg. Severe mitral  annular calcification.   4. 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 Calcified and restricted  right coronary cusp. Moderate restriction in remaining leaflet mobility.  Aortic valve regurgitation is moderate. Mild aortic valve stenosis based  on gradients but likely moderate to  severe aortic valve stenosis based on leaflet mobility. Conisder low  flow-low gradient AS. Aortic regurgitation PHT measures 252 msec. Aortic  valve area, by VTI measures 1.77 cm. Aortic valve mean gradient measures  17.0 mmHg. Aortic valve Vmax measures  2.76 m/s.   5. Aortic dilatation noted. There is mild dilatation of the aortic root,  measuring 40 mm. There is mild dilatation of the ascending aorta,  measuring 43 mm.   Patient Profile   Mr. Vickers is a 83 y.o. 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  Cardiology is consulted to evaluate CHF exacerbation.  Assessment & Plan  #Acute Decompensated HFrEF Exacerbation (EF = 30-35%) # Aortic valve disease Stage C, NYHA class III Echocardiogram December 2025: LVEF 30-25% Etiology of his cardiomyopathy unknown at this time. Patient has had a discussion with my colleague earlier this week regarding evaluating for ischemic substrate.  But given his renal function not an ideal candidate for angiography.  Shared decision was to continue with optimizing his volume status and uptitration of GDMT and ischemic workup to be considered as outpatient or sooner if needed  Transition to torsemide  40 mg p.o. daily. Continue carvedilol  25 mg p.o. twice daily. Discontinue amlodipine . Continue hydralazine  25 mg p.o. every 8 hours. Start Imdur  15 mg p.o. p.o. daily Currently  ACEI/ARB/ARNI, MRA and SGLT2 are contraindicated due to his renal function Ideally would like to do an LHC but this is contraindicated given his renal dysfunction.  Will discuss ischemic workup as outpatient.   Net IO Since Admission: -5,245.47 mL [08/03/24 1022], Strict I's and O's Daily weights Reemphasized importance of low-salt diet Reemphasized importance of no NSAIDs, patient was Aleve  #HTN Blood pressures are well-controlled.  Medications as discussed above   #HLD Recent LDL of 17 as an outpatient.  Will continue current regiment. Continue atorvastatin  10 mg daily  #Iron Deficiency Anemia :: Transferrin saturation <20% and ferritin <200.  No overt signs of bleeding despite drop in hemoglobin. Continue IV ferric gluconate 125 mg x7 days  #AKI on CKD stage V Serum creatinine downtrending over the last 24 hours Avoid nephrotoxic agents. Strict I's and O's Daily weights. Heart failure medications as discussed above. Follows with Dr. Tobie at [facility] and has an appointment in February 2026  Review of electronic medical records also notes concerns for possible atrial fibrillation. Please refer to Dr. Ruthanne progress note from 12/18//2025 - 08/02/2024.  He did review the case with the EP colleagues and as of now underlying rhythm is not A-fib.  However he is at high risk of developing A-fib given his comorbid condition and prior history.  What makes it challenging that he also has a history of bleeding behind his eye and was told the systemic anticoagulation is contraindicated due to the risk of blindness.  I have asked the patient and his sons to discuss this further with ophthalmologist in case if he does truly develop A-fib in the future this information can help them make an informed decision with regards to risks benefit ratio.   For questions or updates, please contact  [facility] Please consult [email or web address] for contact info under   Since last  encounter, independently reviewed labs, telemetry, I's and O's I have ordered diuretics, heart failure medications I have discussed pt's care plan and test results with patient, and both sons during rounds.  Plan of care also discussed with nursing staff.. I have reviewed the last note from Dr. Floretta 08/02/2024.    Signed, Isauro Skelley Michele, DO  08/03/2024, 10:22 AM    "

## 2024-08-03 NOTE — Plan of Care (Signed)
   Problem: Coping: Goal: Ability to adjust to condition or change in health will improve Outcome: Progressing

## 2024-08-03 NOTE — Progress Notes (Signed)
 [facility] night cross cover note:   I was notified by the patient's RN that the patient is requiring that the existing order for Norco every 6 hours as needed is initially effective in controlling his pain, but that it wears off prior to eligibility for the next prn dose of such.  On this basis, modified order for prn Norco to reflect frequency of every 4 hours as needed.    Eva Pore, DO Hospitalist

## 2024-08-04 ENCOUNTER — Other Ambulatory Visit (HOSPITAL_COMMUNITY): Payer: Self-pay

## 2024-08-04 DIAGNOSIS — I10 Essential (primary) hypertension: Secondary | ICD-10-CM | POA: Diagnosis not present

## 2024-08-04 DIAGNOSIS — I359 Nonrheumatic aortic valve disorder, unspecified: Secondary | ICD-10-CM | POA: Diagnosis not present

## 2024-08-04 DIAGNOSIS — I5021 Acute systolic (congestive) heart failure: Secondary | ICD-10-CM | POA: Diagnosis not present

## 2024-08-04 DIAGNOSIS — N179 Acute kidney failure, unspecified: Secondary | ICD-10-CM | POA: Diagnosis not present

## 2024-08-04 LAB — BASIC METABOLIC PANEL WITH GFR
Anion gap: 15 (ref 5–15)
BUN: 90 mg/dL — ABNORMAL HIGH (ref 8–23)
CO2: 15 mmol/L — ABNORMAL LOW (ref 22–32)
Calcium: 8.4 mg/dL — ABNORMAL LOW (ref 8.9–10.3)
Chloride: 102 mmol/L (ref 98–111)
Creatinine, Ser: 5.64 mg/dL — ABNORMAL HIGH (ref 0.61–1.24)
GFR, Estimated: 9 mL/min — ABNORMAL LOW
Glucose, Bld: 82 mg/dL (ref 70–99)
Potassium: 4.8 mmol/L (ref 3.5–5.1)
Sodium: 131 mmol/L — ABNORMAL LOW (ref 135–145)

## 2024-08-04 LAB — CBC
HCT: 26.9 % — ABNORMAL LOW (ref 39.0–52.0)
Hemoglobin: 8.9 g/dL — ABNORMAL LOW (ref 13.0–17.0)
MCH: 31 pg (ref 26.0–34.0)
MCHC: 33.1 g/dL (ref 30.0–36.0)
MCV: 93.7 fL (ref 80.0–100.0)
Platelets: 187 K/uL (ref 150–400)
RBC: 2.87 MIL/uL — ABNORMAL LOW (ref 4.22–5.81)
RDW: 14.6 % (ref 11.5–15.5)
WBC: 8.6 K/uL (ref 4.0–10.5)
nRBC: 0 % (ref 0.0–0.2)

## 2024-08-04 LAB — GLUCOSE, CAPILLARY
Glucose-Capillary: 75 mg/dL (ref 70–99)
Glucose-Capillary: 112 mg/dL — ABNORMAL HIGH (ref 70–99)

## 2024-08-04 MED ORDER — HYDROCODONE-ACETAMINOPHEN 5-325 MG PO TABS
1.0000 | ORAL_TABLET | Freq: Four times a day (QID) | ORAL | 0 refills | Status: DC | PRN
  Filled 2024-08-04: qty 20, 5d supply, fill #0

## 2024-08-04 MED ORDER — LIDOCAINE 5 % EX PTCH
1.0000 | MEDICATED_PATCH | CUTANEOUS | 0 refills | Status: AC
  Filled 2024-08-04: qty 30, 30d supply, fill #0

## 2024-08-04 MED ORDER — HYDRALAZINE HCL 25 MG PO TABS
25.0000 mg | ORAL_TABLET | Freq: Three times a day (TID) | ORAL | 0 refills | Status: DC
  Filled 2024-08-04: qty 90, 30d supply, fill #0

## 2024-08-04 MED ORDER — TORSEMIDE 20 MG PO TABS
40.0000 mg | ORAL_TABLET | Freq: Every day | ORAL | 0 refills | Status: DC
  Filled 2024-08-04: qty 60, 30d supply, fill #0

## 2024-08-04 MED ORDER — CARVEDILOL 25 MG PO TABS
25.0000 mg | ORAL_TABLET | Freq: Two times a day (BID) | ORAL | 0 refills | Status: AC
  Filled 2024-08-04: qty 60, 30d supply, fill #0

## 2024-08-04 NOTE — TOC Transition Note (Signed)
 Transition of Care [facility]) - Discharge Note   Patient Details  Name: Ricardo Mayer. MRN: [id number] Date of Birth: 05-29-1941  [facility] CM/SW Contact:  Marval Gell, RN Phone Number: 08/04/2024, 11:07 AM   Clinical Narrative:     Notified [facility] that patient will DC today, no other ICM needs identified         Patient Goals and CMS Choice            Discharge Placement                       Discharge Plan and Services Additional resources added to the After Visit Summary for                                       Social Drivers of Health (SDOH) Interventions SDOH Screenings   Food Insecurity: No Food Insecurity (07/31/2024)  Housing: Low Risk (07/31/2024)  Transportation Needs: No Transportation Needs (07/31/2024)  Utilities: Not At Risk (07/31/2024)  Alcohol Screen: Low Risk (08/20/2023)  Depression (PHQ2-9): High Risk (07/30/2024)  Financial Resource Strain: Low Risk (08/20/2023)  Physical Activity: Sufficiently Active (08/20/2023)  Social Connections: Socially Integrated (07/31/2024)  Stress: No Stress Concern Present (08/20/2023)  Tobacco Use: Low Risk (07/31/2024)  Health Literacy: Adequate Health Literacy (08/21/2023)     Readmission Risk Interventions    08/01/2024   11:37 AM  Readmission Risk Prevention Plan  Transportation Screening Complete  PCP or Specialist Appt within 5-7 Days Complete  Home Care Screening Complete  Medication Review (RN CM) Complete

## 2024-08-04 NOTE — Discharge Summary (Signed)
 "  Physician Discharge Summary  Ricardo Mayer. FMW:[id number] DOB: 1940-12-21 DOA: 07/30/2024  PCP: [name], DO  Admit date: 07/30/2024 Discharge date: 08/04/2024  Admitted From: home Disposition:  home  Recommendations for Outpatient Follow-up:  Follow up with PCP in 1-2 weeks Please obtain BMP/CBC in one week Follow up with nephrology in 1-2 weeks  Home Health: PT Equipment/Devices: none  Discharge Condition: stable CODE STATUS: Full code Diet Orders (From admission, onward)     Start     Ordered   07/31/24 0419  Diet renal/carb modified with fluid restriction Diet-HS Snack? Nothing; Fluid restriction: 1200 mL Fluid; Room service appropriate? Yes; Fluid consistency: Thin  Diet effective now       Question Answer Comment  Diet-HS Snack? Nothing   Fluid restriction: 1200 mL Fluid   Room service appropriate? 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  Underwent an echo by PCP on 07/25/2024 which showed EF of 30-35%.  Imaging in the ED showed CHF, elevated proBNP and elevated troponin.  Cardiology consulted  Hospital Course / Discharge diagnoses: Principal problem Acute systolic CHF -recent echo showed EF 35 to 35%.  Cardiology consulted and followed patient while hospitalized.  He was diuresed with IV furosemide , his weight improved from 230 on admission to 213 on discharge.  He currently appears euvolemic, feels much better, able to ambulate without difficulties.  Goal-directed medical therapy somewhat limited due to his chronic kidney disease, but was placed on Coreg , hydralazine  along with torsemide  and seems to be tolerating them well.  Continue on discharge with holding parameters   Active  problem Elevated troponin-likely demand ischemia.  Continue statin.  No chest pain CKD 5 -most recent creatinine as an outpatient in August 2025 was 5.5.  His creatinine has remained stable with diuresis, continue torsemide  on discharge PAF ruled out -was concerned about patient having new onset A-fib on 12/18, but on further evaluation cardiology does not think that was accurate. Anemia-of chronic disease, also due to underlying CKD.  Hemoglobin dipped below 8, transfuse unit of packed red blood cells given underlying cardiac issues.  Hemoglobin improved appropriately and is remaining stable Hyperlipidemia-continue statin Hypertension-blood pressure overall stable, had an episode of hypotension due to use of pain medications but this is now resolved Back pain -likely MSK, continue supportive care.  Probably related to being on the stretcher in the ED and hospital bed.  Not limiting his ADLs DM2-not on medications Lab Results  Component Value Date   HGBA1C 5.2 07/31/2024    Sepsis ruled out   Discharge Instructions   Allergies as of 08/04/2024   No Known Allergies      Medication List     STOP taking these medications    amLODipine  5 MG tablet Commonly known as: NORVASC    doxycycline  100 MG tablet Commonly known as: ADOXA   furosemide  20 MG tablet Commonly known as: LASIX    olmesartan  40 MG tablet Commonly known as: BENICAR        TAKE these medications    albuterol  108 (90 Base) MCG/ACT inhaler Commonly known as: VENTOLIN  HFA Inhale 2 puffs into the lungs every 6 (six) hours as needed  for wheezing or shortness of breath.   aspirin  325 MG tablet Take 1 tablet (325 mg total) by mouth daily.   atorvastatin  10 MG tablet Commonly known as: LIPITOR Take 1 tablet (10 mg total) by mouth daily.   azelastine  0.1 % nasal spray Commonly known as: ASTELIN  Place 1 spray into both nostrils 2 (two) times daily. For runny nose   benzonatate  100 MG capsule Commonly known  as: TESSALON  Take 1 capsule (100 mg total) by mouth 2 (two) times daily as needed for cough.   carvedilol  25 MG tablet Commonly known as: COREG  Take 1 tablet (25 mg total) by mouth 2 (two) times daily with a meal. Hold if systolic blood pressure (top number) less than or heart rate less than 60   cholecalciferol 25 MCG (1000 UNIT) tablet Commonly known as: VITAMIN D3 Take 1,000 Units by mouth daily.   doxazosin  1 MG tablet Commonly known as: CARDURA  Take 1 mg by mouth at bedtime.   hydrALAZINE  25 MG tablet Commonly known as: APRESOLINE  Take 1 tablet (25 mg total) by mouth 3 (three) times daily. Hold if systolic blood pressure (top number) less than   HYDROcodone -acetaminophen  5-325 MG tablet Commonly known as: NORCO/VICODIN Take 1 tablet by mouth every 6 (six) hours as needed for severe pain (pain score 7-10).   lidocaine  5 % Commonly known as: LIDODERM  Place 1 patch onto the skin daily. Remove & Discard patch within 12 hours or as directed by MD   Lokelma  10 g Pack packet Generic drug: sodium zirconium cyclosilicate  Take 10 g by mouth daily.   loratadine  10 MG tablet Commonly known as: CLARITIN  Take 1 tablet EVERY OTHER day.  RENAL DOSING!!! Please reiterate this.   torsemide  20 MG tablet Commonly known as: DEMADEX  Take 2 tablets (40 mg total) by mouth daily. Hold if systolic blood pressure (top number) is < 120 mmHg.        Follow-up Information     Jolinda Potter M, DO Follow up.   Specialty: Family Medicine Contact information: [address] [place] [address] [phone]         Floretta Mallard, MD Follow up on 08/20/2024.   Specialty: Cardiology Why: 10 AM Contact information: [address] [place] [address] [phone]                 Consultations: Cardiology  Procedures/Studies:  US  RENAL Result Date: 07/31/2024 CLINICAL DATA:  [id number] AKI (acute kidney injury) [id number] EXAM: RENAL / URINARY TRACT  ULTRASOUND COMPLETE COMPARISON:  03/25/2024 FINDINGS: Right Kidney: Renal measurements: 9.6 x 4.4 x 4 cm = volume: 87 mL.Mildly increased echogenicity. Upper pole cyst measuring 1.1 cm. No hydronephrosis or nephrolithiasis. Left Kidney: Renal measurements: 9.4 x 5.4 x 5 cm = volume: 132 mL. Mildly increased echogenicity. No mass. No hydronephrosis or nephrolithiasis. Bladder: Appears normal for degree of bladder distention. Other: None. IMPRESSION: No hydronephrosis or nephrolithiasis. Renal parenchymal changes suggestive of chronic medical renal disease. Electronically Signed   By: Rogelia Myers M.D.   On: 07/31/2024 09:01   DG Chest 2 View Result Date: 07/30/2024 EXAM: 2 VIEW(S) XRAY OF THE CHEST 07/25/2024 10:35:36 AM COMPARISON: 06/19/2024 CLINICAL HISTORY: Short of breath FINDINGS: LUNGS AND PLEURA: Small bilateral pleural effusions with basilar atelectasis, greater on the right. Appearances are similar to the prior study. No airspace disease or consolidation in the lungs. No pneumothorax. HEART AND MEDIASTINUM: Cardiac enlargement. Mediastinal contours appear intact. BONES AND SOFT TISSUES: Degenerative changes in the spine  and shoulders. No acute osseous abnormality. IMPRESSION: 1. Small bilateral pleural effusions with basilar atelectasis, greater on the right, similar to the prior study. 2. Cardiac enlargement. Electronically signed by: Elsie Gravely MD 07/30/2024 06:38 PM EST RP Workstation: [id number]   DG Chest 2 View Result Date: 07/30/2024 EXAM: 2 VIEW(S) XRAY OF THE CHEST 07/30/2024 06:25:00 PM COMPARISON: Comparison with 07/25/2024. CLINICAL HISTORY: shortness of breath FINDINGS: LUNGS AND PLEURA: No focal pulmonary opacity. No vascular congestion or edema. Small bilateral pleural effusions with basilar atelectasis, greater on the right. Similar appearance to prior study. No pneumothorax. HEART AND MEDIASTINUM: Mild cardiac enlargement. Mediastinal contours appear intact. BONES AND SOFT  TISSUES: Degenerative changes in the spine and shoulders. IMPRESSION: 1. Small bilateral pleural effusions with basilar atelectasis, greater on the right, similar to prior study. 2. Mild cardiac enlargement without vascular congestion or edema. Electronically signed by: Elsie Gravely MD 07/30/2024 06:37 PM EST RP Workstation: [id number]   ECHOCARDIOGRAM COMPLETE Result Date: 07/25/2024    ECHOCARDIOGRAM REPORT   Patient Name:   Camila Norville. Date of Exam: 07/25/2024 Medical Rec #:  [id number]          Height:       67.0 in Accession #:    [id number]         Weight:       232.0 lb Date of Birth:  01/17/1941           BSA:          2.154 m Patient Age:    83 years           BP:           148/62 mmHg Patient Gender: M                  HR:           93 bpm. Exam Location:  [facility] Procedure: 2D Echo, Cardiac Doppler and Color Doppler (Both Spectral and Color            Flow Doppler were utilized during procedure). Indications:    R06.02 SOB  History:        Patient has prior history of Echocardiogram examinations, most                 recent 06/16/2015. Aortic Valve Disease, Signs/Symptoms:Dyspnea;                 Risk Factors:Obesity, Hypertension, Diabetes, Dyslipidemia and                 Non-Smoker.  Sonographer:    Bascom Burows RCS, RVS Referring Phys: [phone] JOSHUA A DETTINGER IMPRESSIONS  1. Left ventricular ejection fraction, by estimation, is 30 to 35%. The left ventricle has moderately decreased function. Left ventricular endocardial border not optimally defined to evaluate regional wall motion. There is mild left ventricular hypertrophy. Left ventricular diastolic parameters are consistent with Grade I diastolic dysfunction (impaired relaxation).  2. Right ventricular systolic function is normal. The right ventricular size is normal. Tricuspid regurgitation signal is inadequate for assessing PA pressure.  3. The mitral valve is abnormal. Mild mitral valve regurgitation. Mild mitral stenosis. The mean  mitral valve gradient is 3.0 mmHg. Severe mitral annular calcification.  4. 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 Calcified and restricted right coronary cusp. Moderate restriction in remaining leaflet mobility. Aortic valve regurgitation is moderate. Mild aortic valve stenosis based on gradients but likely moderate to severe aortic valve stenosis based on leaflet mobility. Conisder  low flow-low gradient AS. Aortic regurgitation PHT measures 252 msec. Aortic valve area, by VTI measures 1.77 cm. Aortic valve mean gradient measures 17.0 mmHg. Aortic valve Vmax measures 2.76 m/s.  5. Aortic dilatation noted. There is mild dilatation of the aortic root, measuring 40 mm. There is mild dilatation of the ascending aorta, measuring 43 mm. Comparison(s): A prior study was performed on 06/16/2015. EF 55-60%. Left atrium mild to moderately dilated. Calcified right coronay cusp. Aortiv valve sclerosis without stenosis. Mild dilated ascending aorta. FINDINGS  Left Ventricle: Left ventricular ejection fraction, by estimation, is 30 to 35%. The left ventricle has moderately decreased function. Left ventricular endocardial border not optimally defined to evaluate regional wall motion. Strain was performed and the global longitudinal strain is indeterminate. The left ventricular internal cavity size was normal in size. There is mild left ventricular hypertrophy. Left ventricular diastolic function could not be evaluated due to mitral annular calcification (moderate or greater). Left ventricular diastolic parameters are consistent with Grade I diastolic dysfunction (impaired relaxation). Right Ventricle: The right ventricular size is normal. No increase in right ventricular wall thickness. Right ventricular systolic function is normal. Tricuspid regurgitation signal is inadequate for assessing PA pressure. Left Atrium: Left atrial size was normal in size. Right Atrium: Right atrial size was normal in size. Pericardium: There is  no evidence of pericardial effusion. Mitral Valve: The mitral valve is abnormal. Severe mitral annular calcification. Mild mitral valve regurgitation. Mild mitral valve stenosis. MV peak gradient, 8.9 mmHg. The mean mitral valve gradient is 3.0 mmHg. Tricuspid Valve: The tricuspid valve is normal in structure. Tricuspid valve regurgitation is not demonstrated. No evidence of tricuspid stenosis. Aortic Valve: The aortic valve is bicuspid. Aortic valve regurgitation is moderate. Aortic regurgitation PHT measures 252 msec. Mild aortic stenosis is present. Aortic valve mean gradient measures 17.0 mmHg. Aortic valve peak gradient measures 30.4 mmHg.  Aortic valve area, by VTI measures 1.77 cm. Pulmonic Valve: The pulmonic valve was normal in structure. Pulmonic valve regurgitation is not visualized. No evidence of pulmonic stenosis. Aorta: Aortic dilatation noted. There is mild dilatation of the aortic root, measuring 40 mm. There is mild dilatation of the ascending aorta, measuring 43 mm. Venous: The inferior vena cava was not well visualized. IAS/Shunts: No atrial level shunt detected by color flow Doppler. Additional Comments: 3D was performed not requiring image post processing on an independent workstation and was indeterminate.  LEFT VENTRICLE PLAX 2D LVIDd:         6.30 cm      Diastology LVIDs:         4.80 cm      LV e' medial:    3.92 cm/s LV PW:         1.20 cm      LV E/e' medial:  37.0 LV IVS:        1.20 cm      LV e' lateral:   6.64 cm/s LVOT diam:     2.40 cm      LV E/e' lateral: 21.8 LV SV:         102 LV SV Index:   47 LVOT Area:     4.52 cm  LV Volumes (MOD) LV vol d, MOD A2C: 128.0 ml LV vol d, MOD A4C: 127.0 ml LV vol s, MOD A2C: 78.0 ml LV vol s, MOD A4C: 96.6 ml LV SV MOD A2C:     50.0 ml LV SV MOD A4C:     127.0 ml LV SV MOD  BP:      41.8 ml RIGHT VENTRICLE RV Basal diam:  2.90 cm     PULMONARY VEINS RV Mid diam:    2.90 cm     Diastolic Velocity: 72.60 cm/s RV S prime:     10.87 cm/s  S/D  Velocity:       0.70 TAPSE (M-mode): 1.8 cm      Systolic Velocity:  53.90 cm/s LEFT ATRIUM             Index        RIGHT ATRIUM           Index LA diam:        4.80 cm 2.23 cm/m   RA Area:     16.90 cm LA Vol (A2C):   51.8 ml 24.05 ml/m  RA Volume:   43.40 ml  20.15 ml/m LA Vol (A4C):   64.0 ml 29.71 ml/m LA Biplane Vol: 58.8 ml 27.30 ml/m  AORTIC VALVE                     PULMONIC VALVE AV Area (Vmax):    1.58 cm      PV Vmax:       1.32 m/s AV Area (Vmean):   1.44 cm      PV Peak grad:  6.9 mmHg AV Area (VTI):     1.77 cm AV Vmax:           275.50 cm/s AV Vmean:          197.000 cm/s AV VTI:            0.575 m AV Peak Grad:      30.4 mmHg AV Mean Grad:      17.0 mmHg LVOT Vmax:         96.20 cm/s LVOT Vmean:        62.900 cm/s LVOT VTI:          0.225 m LVOT/AV VTI ratio: 0.39 AI PHT:            252 msec AR Vena Contracta: 0.90 cm  AORTA Ao Root diam: 4.00 cm Ao Asc diam:  4.30 cm MITRAL VALVE MV Area (PHT): 6.60 cm     SHUNTS MV Area VTI:   2.80 cm     Systemic VTI:  0.22 m MV Peak grad:  8.9 mmHg     Systemic Diam: 2.40 cm MV Mean grad:  3.0 mmHg MV Vmax:       1.49 m/s MV Vmean:      77.8 cm/s MV Decel Time: 115 msec MV E velocity: 145.00 cm/s MV A velocity: 142.00 cm/s MV E/A ratio:  1.02 Vishnu Priya Mallipeddi Electronically signed by Diannah Late Mallipeddi Signature Date/Time: 07/25/2024/3:52:16 PM    Final      Subjective: - no chest pain, shortness of breath, no abdominal pain, nausea or vomiting.   Discharge Exam: BP 104/67 (BP Location: Right Arm)   Pulse 72   Temp (!) 97.5 F (36.4 C) (Oral)   Resp 16   Ht 5' 7 (1.702 m)   Wt 97 kg   SpO2 95%   BMI 33.49 kg/m   General: Pt is alert, awake, not in acute distress Cardiovascular: RRR, S1/S2 +, no rubs, no gallops.  No peripheral edema.  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     Microbiology: No results found for this or any previous visit (from the past 240 hours).   Labs: Basic Metabolic Panel: Recent Labs  Lab 07/31/24 0511 08/01/24 0232 08/02/24 0252 08/03/24 0221 08/04/24 0708  NA 139 137 136 134* 131*  K 4.6 4.5 5.0 4.9 4.8  CL 106 105 103 101 102  CO2 19* 18* 19* 19* 15*  GLUCOSE 86 90 89 86 82  BUN 61* 67* 79* 85* 90*  CREATININE 5.05* 5.47* 5.86* 5.74* 5.64*  CALCIUM  9.1 8.6* 8.7* 8.7* 8.4*  MG 2.3 2.2 2.2 2.2  --    Liver Function Tests: Recent Labs  Lab 07/31/24 0511 08/01/24 0232  AST 25 22  ALT 15 12  ALKPHOS 72 62  BILITOT 0.4 0.3  PROT 5.7* 5.2*  ALBUMIN 3.7 3.3*   CBC: Recent Labs  Lab 07/31/24 0511 07/31/24 0819 08/01/24 0232 08/01/24 1806 08/02/24 0252 08/03/24 0221 08/04/24 0334  WBC 7.6 7.5 7.1 7.6 7.3 8.5 8.6  NEUTROABS 5.3 5.0  --   --   --   --   --   HGB 8.0* 8.2* 7.6* 8.8* 8.4* 9.0* 8.9*  HCT 25.2* 26.2* 23.5* 26.3* 25.6* 27.2* 26.9*  MCV 97.7 96.7 94.8 94.6 93.4 92.8 93.7  PLT 184 207 185 169 175 177 187   CBG: Recent Labs  Lab 08/03/24 0625 08/03/24 1100 08/03/24 1617 08/03/24 2126 08/04/24 0629  GLUCAP 84 117* 102* 113* 75   Hgb A1c No results for input(s): HGBA1C in the last 72 hours. Lipid Profile No results for input(s): CHOL, HDL, LDLCALC, TRIG, CHOLHDL, LDLDIRECT in the last 72 hours. Thyroid  function studies No results for input(s): TSH, T4TOTAL, T3FREE, THYROIDAB in the last 72 hours.  Invalid input(s): FREET3 Urinalysis    Component Value Date/Time   COLORURINE STRAW (A) 03/25/2024 1300   APPEARANCEUR CLEAR 03/25/2024 1300   APPEARANCEUR Clear 02/01/2018 0843   LABSPEC 1.011 03/25/2024 1300   PHURINE 6.0 03/25/2024 1300   GLUCOSEU 50 (A) 03/25/2024 1300   HGBUR NEGATIVE 03/25/2024 1300   BILIRUBINUR NEGATIVE 03/25/2024  1300   BILIRUBINUR Negative 02/01/2018 0843   KETONESUR NEGATIVE 03/25/2024 1300   PROTEINUR 100 (A) 03/25/2024 1300   UROBILINOGEN negative 12/16/2014 1529   NITRITE NEGATIVE 03/25/2024 1300   LEUKOCYTESUR NEGATIVE 03/25/2024 1300    FURTHER DISCHARGE INSTRUCTIONS:   Get Medicines reviewed and adjusted: Please take all your medications with you for your next visit with your Primary MD   Laboratory/radiological data: Please request your Primary MD to go over all hospital tests and procedure/radiological results at the follow up, please ask your Primary MD to get all Hospital records sent to his/her office.   In some cases, they will be blood work, cultures and biopsy results pending at the time of your discharge. Please request that your primary care M.D. goes through all the records of your hospital data and follows up on these results.   Also Note the following: If you experience worsening of your admission symptoms, develop shortness of breath, life threatening emergency, suicidal or homicidal thoughts you must seek medical attention immediately by calling 911 or calling your MD immediately  if symptoms less severe.   You must read complete instructions/literature along with all the possible adverse reactions/side effects for all the Medicines you take and that have been prescribed to you. Take any new Medicines after you have completely understood and accpet  all the possible adverse reactions/side effects.    Do not drive when taking Pain medications or sleeping medications (Benzodaizepines)   Do not take more than prescribed Pain, Sleep and Anxiety Medications. It is not advisable to combine anxiety,sleep and pain medications without talking with your primary care practitioner   Special Instructions: If you have smoked or chewed Tobacco  in the last 2 yrs please stop smoking, stop any regular Alcohol  and or any Recreational drug use.   Wear Seat belts while driving.   Please  note: You were cared for by a hospitalist during your hospital stay. Once you are discharged, your primary care physician will handle any further medical issues. Please note that NO REFILLS for any discharge medications will be authorized once you are discharged, as it is imperative that you return to your primary care physician (or establish a relationship with a primary care physician if you do not have one) for your post hospital discharge needs so that they can reassess your need for medications and monitor your lab values.  Time coordinating discharge: 35 minutes  SIGNED:  Nilda Fendt, MD, PhD 08/04/2024, 10:55 AM   "

## 2024-08-04 NOTE — Progress Notes (Signed)
 Jasiyah D Etha Raddle. to be discharged home per MD order. Discussed with the patient and all questions fully answered. TOC meds picked up from pharmacy. Skin clean, dry, and intact without evidence of skin break down. IV catheter discontinued intact. Site without signs and symptoms of complications. Dressing and pressure applied.  An After Visit Summary was printed and given to the patient.  Patient escorted via [facility], and discharged home via private auto.  Dorthy JONELLE Gay  08/04/2024 1:10 PM

## 2024-08-04 NOTE — Progress Notes (Signed)
 "  Rounding Note   Patient Name: Ricardo Mayer. Date of Encounter: 08/04/2024  [facility] Cardiologist: None Dr. Georganna Archer (new)  Subjective Patient seen and examined at bedside. Denies anginal chest pain. Shortness of breath much improved. Son at bedside Blood pressures are softer this morning as he was requiring a lot of pain medications around-the-clock overnight. Of note, he has done well with hydralazine , carvedilol , transitioning from amlodipine  to Imdur  as per [facility].  Scheduled Meds:  aspirin  EC  81 mg Oral Daily   atorvastatin   10 mg Oral Daily   carvedilol   25 mg Oral BID WC   doxazosin   1 mg Oral QHS   hydrALAZINE   25 mg Oral Q8H   insulin  aspart  0-6 Units Subcutaneous TID WC   lidocaine   1 patch Transdermal Q24H   torsemide   40 mg Oral Daily   Continuous Infusions:  ferric gluconate (FERRLECIT ) IVPB 125 mg (08/04/24 0909)   PRN Meds: acetaminophen  **OR** acetaminophen , HYDROcodone -acetaminophen    Vital Signs  Vitals:   08/03/24 2034 08/04/24 0011 08/04/24 0416 08/04/24 0722  BP: 139/80 125/65 115/63 (!) 90/56  Pulse: 81 79 80 72  Resp: 18 18 20 18   Temp: 97.9 F (36.6 C) 97.7 F (36.5 C) (!) 97.5 F (36.4 C) (!) 97.4 F (36.3 C)  TempSrc: Oral Axillary Oral Oral  SpO2: 100% 100% 94% 95%  Weight:   97 kg   Height:        Intake/Output Summary (Last 24 hours) at 08/04/2024 0941 Last data filed at 08/04/2024 0420 Gross per 24 hour  Intake 110 ml  Output 400 ml  Net -290 ml   Net IO Since Admission: -5,535.47 mL [08/04/24 0941]     08/04/2024    4:16 AM 08/03/2024    3:37 AM 08/02/2024    3:00 AM  Last 3 Weights  Weight (lbs) 213 lb 12.8 oz 211 lb 10.3 oz 215 lb 2.7 oz  Weight (kg) 96.979 kg 96 kg 97.6 kg      Telemetry Sinus rhythm without ectopy  ECG  No new tracing  Physical Exam  GEN: No acute distress, very pleasant Neck: No JVP. Cardiac: RRR, 3 out of 6 systolic ejection murmur at the second intercostal  space, no rubs, or gallops.  Respiratory: Decreased breath sounds bilaterally especially mid to lower lung fields, no rhonchi's or wheezing appreciated GI: Obese, soft, nontender, non-distended  MS: Trace bilateral edema; dry skin, no deformity. Neuro:  Nonfocal  Psych: Normal affect   Labs High Sensitivity Troponin:  No results for input(s): TROPONINIHS in the last 720 hours.  Recent Labs  Lab 07/30/24 1800 07/30/24 2346  TRNPT 110* 105*       Chemistry Recent Labs  Lab 07/31/24 0511 08/01/24 0232 08/02/24 0252 08/03/24 0221 08/04/24 0708  NA 139 137 136 134* 131*  K 4.6 4.5 5.0 4.9 4.8  CL 106 105 103 101 102  CO2 19* 18* 19* 19* 15*  GLUCOSE 86 90 89 86 82  BUN 61* 67* 79* 85* 90*  CREATININE 5.05* 5.47* 5.86* 5.74* 5.64*  CALCIUM  9.1 8.6* 8.7* 8.7* 8.4*  MG 2.3 2.2 2.2 2.2  --   PROT 5.7* 5.2*  --   --   --   ALBUMIN 3.7 3.3*  --   --   --   AST 25 22  --   --   --   ALT 15 12  --   --   --   [facility]  72 62  --   --   --   BILITOT 0.4 0.3  --   --   --   GFRNONAA 11* 10* 9* 9* 9*  ANIONGAP 14 14 14 14 15     Lipids No results for input(s): CHOL, TRIG, HDL, LABVLDL, LDLCALC, CHOLHDL in the last 168 hours.  Hematology Recent Labs  Lab 08/02/24 0252 08/03/24 0221 08/04/24 0334  WBC 7.3 8.5 8.6  RBC 2.74* 2.93* 2.87*  HGB 8.4* 9.0* 8.9*  HCT 25.6* 27.2* 26.9*  MCV 93.4 92.8 93.7  MCH 30.7 30.7 31.0  MCHC 32.8 33.1 33.1  RDW 14.9 14.6 14.6  PLT 175 177 187   Thyroid   Recent Labs  Lab 07/31/24 0046  TSH 1.670    BNP Recent Labs  Lab 07/30/24 1800  PROBNP >35,000.0*    DDimer No results for input(s): DDIMER in the last 168 hours.   Radiology  No results found.   Cardiac Studies    TTE 07/25/24:  IMPRESSIONS     1. Left ventricular ejection fraction, by estimation, is 30 to 35%. The  left ventricle has moderately decreased function. Left ventricular  endocardial border not optimally defined to evaluate regional wall  motion.  There is mild left ventricular  hypertrophy. Left ventricular diastolic parameters are consistent with  Grade I diastolic dysfunction (impaired relaxation).   2. Right ventricular systolic function is normal. The right ventricular  size is normal. Tricuspid regurgitation signal is inadequate for assessing  PA pressure.   3. The mitral valve is abnormal. Mild mitral valve regurgitation. Mild  mitral stenosis. The mean mitral valve gradient is 3.0 mmHg. Severe mitral  annular calcification.   4. 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 Calcified and restricted  right coronary cusp. Moderate restriction in remaining leaflet mobility.  Aortic valve regurgitation is moderate. Mild aortic valve stenosis based  on gradients but likely moderate to  severe aortic valve stenosis based on leaflet mobility. Conisder low  flow-low gradient AS. Aortic regurgitation PHT measures 252 msec. Aortic  valve area, by VTI measures 1.77 cm. Aortic valve mean gradient measures  17.0 mmHg. Aortic valve Vmax measures  2.76 m/s.   5. Aortic dilatation noted. There is mild dilatation of the aortic root,  measuring 40 mm. There is mild dilatation of the ascending aorta,  measuring 43 mm.   Patient Profile   Ricardo Mayer is a 83 y.o. 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  Cardiology is consulted to evaluate CHF exacerbation.  Assessment & Plan  #Acute Decompensated HFrEF Exacerbation (EF = 30-35%) # Aortic valve disease Stage C, NYHA class II Echocardiogram December 2025: LVEF 30-25% Etiology of his cardiomyopathy unknown at this time. Patient has had a discussion with my colleague last week regarding evaluating for ischemic substrate.  But given his renal function not an ideal candidate for angiography.  Shared decision was to continue with optimizing his  volume status and uptitration of GDMT and ischemic workup to be considered as outpatient or sooner if needed  Continue torsemide  40 mg p.o. daily, holding parameters placed Continue carvedilol  25 mg p.o. twice daily, holding parameters placed Discontinue amlodipine . Continue hydralazine  25 mg p.o. every 8 hours with holding parameters. Though he did well with 1 dose of Imdur  15 mg, will discontinue for now due to soft blood pressures And given  his back pain he requires scheduled pain medications.  To prevent hypotension and/or another substrate for AKI I recommended to the patient and son to check his blood pressures prior to taking the prescribed medications. Currently ACEI/ARB/ARNI, MRA and SGLT2 are contraindicated due to his renal function Ideally would like to do an LHC but this is contraindicated given his renal dysfunction.  Will discuss ischemic workup as outpatient.   Net IO Since Admission: -5,535.47 mL [08/04/24 0941], Strict I's and O's Daily weights Reemphasized importance of low-salt diet Reemphasized importance of no NSAIDs, patient was Aleve  #HTN Blood pressures are overall well-controlled, with the exception of this morning. Blood pressure is likely soft due to overnight pain medications controlled intervals.  Medications as discussed above   #HLD Recent LDL of 17 as an outpatient.  Will continue current regiment. Continue atorvastatin  10 mg daily  #Iron Deficiency Anemia :: Transferrin saturation <20% and ferritin <200.  No overt signs of bleeding despite drop in hemoglobin. Continue IV ferric gluconate 125 mg x7 days  #AKI on CKD stage V Serum creatinine downtrending over the last 24 hours Avoid nephrotoxic agents. Strict I's and O's Daily weights. Heart failure medications as discussed above. Follows with Dr. Tobie at [facility] and has an appointment in February 2026  Review of electronic medical records also notes concerns for possible atrial  fibrillation. Please refer to Dr. Ruthanne progress note from 12/18//2025 - 08/02/2024.  He did review the case with the EP colleagues and as of now underlying rhythm is not A-fib.  However he is at high risk of developing A-fib given his comorbid condition and prior history.  What makes it challenging that he also has a history of bleeding behind his eye and was told the systemic anticoagulation is contraindicated due to the risk of blindness.  I have asked the patient and his sons to discuss this further with ophthalmologist in case if he does truly develop A-fib in the future this information can help them make an informed decision with regards to risks benefit ratio.  If patient's blood pressure improves later today and primary team feels it would be  safe discharge would recommend the following medications: Continue torsemide  40 mg p.o. daily, holding parameters placed Continue carvedilol  25 mg p.o. twice daily, holding parameters placed Continue hydralazine  25 mg p.o. every 8 hours with holding parameters. Recommend holding home dose amlodipine  due to low EF Recommend holding home dose olmesartan  due to CKD stage V, he can discuss this further with his nephrologist as outpatient  Will arrange outpatient follow-up in approximately 2 weeks.   For questions or updates, please contact [facility] Please consult [email or web address] for contact info under   Since last encounter, independently reviewed labs, telemetry, I's and O's I have ordered diuretics, heart failure medications I have discussed pt's care plan and test results with patient, and son during rounds and RN and attending physician.   I have reviewed the pertinent progress notes since  yesterday's rounding  Signed, Madonna Large, DO  08/04/2024, 9:41 AM    "

## 2024-08-05 ENCOUNTER — Telehealth: Payer: Self-pay

## 2024-08-05 NOTE — Transitions of Care (Post Inpatient/ED Visit) (Signed)
 "  08/05/2024  Name: Ricardo Mayer. MRN: [id number] DOB: 1941-01-23  Today's TOC FU Call Status: Today's TOC FU Call Status:: Successful TOC FU Call Completed TOC FU Call Complete Date: 08/05/24  Patient's Name and Date of Birth confirmed. Name, DOB  Transition Care Management Follow-up Telephone Call Date of Discharge: 08/04/24 Discharge Facility: [facility]) Type of Discharge: Inpatient Admission Primary Inpatient Discharge Diagnosis:: Acute HFrEF (heart failure with reduced ejection fraction How have you been since you were released from the hospital?: Better Any questions or concerns?: No  Items Reviewed: Medications obtained,verified, and reconciled?: Yes (Medications Reviewed) Any new allergies since your discharge?: No Dietary orders reviewed?: Yes Type of Diet Ordered:: Heart healthy Do you have support at home?: Yes People in Home [RPT]: spouse Name of Support/Comfort Primary Source: has support  Medications Reviewed Today: Medications Reviewed Today     Reviewed by Lauro Shona LABOR, RN (Registered Nurse) on 08/05/24 at 1315  Med List Status: <None>   Medication Order Taking? Sig Documenting Provider Last Dose Status Informant  albuterol  (VENTOLIN  HFA) 108 (90 Base) MCG/ACT inhaler [id number]  Inhale 2 puffs into the lungs every 6 (six) hours as needed for wheezing or shortness of breath.  Patient not taking: Reported on 08/05/2024   Lavell Bari LABOR, FNP  Active Self, Child, Pharmacy Records  aspirin  325 MG tablet [id number] Yes Take 1 tablet (325 mg total) by mouth daily. [name], DO  Active Self, Child, Pharmacy Records  atorvastatin  (LIPITOR) 10 MG tablet [id number] Yes Take 1 tablet (10 mg total) by mouth daily. [name], DO  Active Self, Child, Pharmacy Records  azelastine  (ASTELIN ) 0.1 % nasal spray [id number] Yes Place 1 spray into both nostrils 2 (two) times daily. For runny nose Lavell Bari LABOR, FNP  Active Self, Child, Pharmacy  Records  benzonatate  (TESSALON ) 100 MG capsule [id number]  Take 1 capsule (100 mg total) by mouth 2 (two) times daily as needed for cough.  Patient not taking: Reported on 08/05/2024   Lavell Bari LABOR, FNP  Active Self, Child, Pharmacy Records  carvedilol  (COREG ) 25 MG tablet [id number] Yes Take 1 tablet (25 mg total) by mouth 2 (two) times daily with a meal. Hold if systolic blood pressure (top number) less than or heart rate less than 60 Gherghe, Nilda HERO, MD  Active   cholecalciferol (VITAMIN D3) 25 MCG (1000 UT) tablet [id number] Yes Take 1,000 Units by mouth daily. [provider]  Active Self, Child, Pharmacy Records  doxazosin  (CARDURA ) 1 MG tablet [id number] Yes Take 1 mg by mouth at bedtime. [provider]  Active Self, Child, Pharmacy Records  hydrALAZINE  (APRESOLINE ) 25 MG tablet [id number] Yes Take 1 tablet (25 mg total) by mouth 3 (three) times daily. Hold if systolic blood pressure (top number) less than Gherghe, Costin M, MD  Active   HYDROcodone -acetaminophen  (NORCO/VICODIN) 5-325 MG tablet [id number] Yes Take 1 tablet by mouth every 6 (six) hours as needed for severe pain (pain score 7-10). Gherghe, Costin M, MD  Active   lidocaine  (LIDODERM ) 5 % [id number] Yes Place 1 patch onto the skin daily. Remove & Discard patch within 12 hours or as directed by MD Gherghe, Costin M, MD  Active   loratadine  (CLARITIN ) 10 MG tablet [id number] Yes Take 1 tablet EVERY OTHER day.  RENAL DOSING!!! Please reiterate this. [name], DO  Active Self, Child, Pharmacy Records  sodium zirconium cyclosilicate  (LOKELMA ) 10 g PACK packet [id number] Yes Take 10 g  by mouth daily. Patsey Lot, MD  Active Self, Child, Pharmacy Records  torsemide  (DEMADEX ) 20 MG tablet [id number] Yes Take 2 tablets (40 mg total) by mouth daily. Hold if systolic blood pressure (top number) is < 120 mmHg. Trixie Nilda HERO, MD  Active   Med List Note (Ashe, Dana K, CPhT 07/10/17 1630):  Medications confirmed via pharmacy records`            Home Care and Equipment/Supplies: Were Home Health Services Ordered?: Yes Name of Home Health Agency:: [facility] Has Agency set up a time to come to your home?: No - Patient declined TOC RN offer to call and see when they'd be visiting  Functional Questionnaire: Do you need assistance with bathing/showering or dressing?: No Do you need assistance with meal preparation?: No Do you need assistance with eating?: No Do you have difficulty maintaining continence: No Do you need assistance with getting out of bed/getting out of a chair/moving?: No Do you have difficulty managing or taking your medications?: No  Follow up appointments reviewed: PCP Follow-up appointment confirmed?: No (Patient wants to call himself) Specialist Hospital Follow-up appointment confirmed?: NA Do you need transportation to your follow-up appointment?: No Do you understand care options if your condition(s) worsen?: Yes-patient verbalized understanding  SDOH Interventions Today    Flowsheet Row Most Recent Value  SDOH Interventions   Food Insecurity Interventions Intervention Not Indicated  Housing Interventions Intervention Not Indicated  Transportation Interventions Intervention Not Indicated  Utilities Interventions Intervention Not Indicated    Patient stated he does not want to enroll in [facility] Program and [facility] RN was unable to complete all assessment questions  Shona Prow RN, CCM [place]  VBCI-Population Health RN Care Manager [phone]  "

## 2024-08-06 ENCOUNTER — Telehealth: Payer: Self-pay

## 2024-08-06 NOTE — Telephone Encounter (Signed)
 Spoke to wife. Already been to hospital regarding MRI and has apt with cardiology.

## 2024-08-06 NOTE — Telephone Encounter (Signed)
 Copied from CRM [id number]. Topic: Clinical - Lab/Test Results >> Aug 05, 2024  4:33 PM Graeme ORN wrote: Reason for CRM: Patient wife called. States someone tried to call her husband. He is hard of hearing and did not know who is was and what they said. Looks like it was for imaging results. She would like a call back on her number with information. [phone] Thank You

## 2024-08-12 ENCOUNTER — Telehealth: Payer: Self-pay | Admitting: Family Medicine

## 2024-08-12 NOTE — Telephone Encounter (Unsigned)
 Copied from CRM #[id number]. Topic: Clinical - Home Health Verbal Orders >> Aug 12, 2024 12:00 PM Miquel SAILOR wrote: Caller/Agency: Jon From [facility]  Home Health Callback Number: [phone] Service Requested: Skilled Nursing Frequency: 1 3 week every other week 6 Any new concerns about the patient? Yes-Pulmonary Heart failure issue

## 2024-08-12 NOTE — Telephone Encounter (Signed)
 Verbal given. LS

## 2024-08-16 ENCOUNTER — Ambulatory Visit: Admitting: Nurse Practitioner

## 2024-08-16 ENCOUNTER — Encounter: Payer: Self-pay | Admitting: Nurse Practitioner

## 2024-08-16 ENCOUNTER — Telehealth: Payer: Self-pay | Admitting: Family Medicine

## 2024-08-16 VITALS — BP 142/76 | HR 69 | Temp 97.2°F | Ht 67.0 in | Wt 216.0 lb

## 2024-08-16 DIAGNOSIS — I152 Hypertension secondary to endocrine disorders: Secondary | ICD-10-CM | POA: Diagnosis not present

## 2024-08-16 DIAGNOSIS — I5021 Acute systolic (congestive) heart failure: Secondary | ICD-10-CM | POA: Diagnosis not present

## 2024-08-16 DIAGNOSIS — Z789 Other specified health status: Secondary | ICD-10-CM

## 2024-08-16 DIAGNOSIS — E1159 Type 2 diabetes mellitus with other circulatory complications: Secondary | ICD-10-CM | POA: Diagnosis not present

## 2024-08-16 DIAGNOSIS — I509 Heart failure, unspecified: Secondary | ICD-10-CM

## 2024-08-16 NOTE — Patient Instructions (Signed)

## 2024-08-16 NOTE — Progress Notes (Signed)
 "  Subjective:    Patient ID: Ricardo JONETTA Etha Mickey., male    DOB: 24-Feb-1941, 84 y.o.   MRN: [id number]   Chief Complaint: Transitions Of Care   HPI Today's visit was for Transitional Care Management.  The patient was discharged from [facility] on 08/04/24 with a primary diagnosis of acute heart failure.   Contact with the patient and/or caregiver, by a clinical staff member, was made on 08/05/24 and was documented as a telephone encounter within the EMR.  Through chart review and discussion with the patient I have determined that management of their condition is of high complexity.    Patient was admitted on 07/30/24 with SOB and edema. She was admitted to hospital and was diuresed. They stopped amlodipine , doxycycline , lasix  and olmesartan . They started him on carvedilol , hydralazine  and demadex . Since he went home he is still having some SOB with activity.  Very limited swelling. He will see nephrology in 2 weeks , and cardiology next week.  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/17/2023   Wears hearing aid in both ears 05/17/2023   History of pulmonary embolus (PE) 01/10/2020   History of deep vein thrombosis (DVT) of lower extremity 01/10/2020   History of prostate cancer 01/10/2020   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     Heart sounds: Normal heart sounds.  Pulmonary:     Effort: Pulmonary effort is normal.     Breath sounds: Rales present.  Musculoskeletal:     Right lower leg: Edema (1+) present.     Left lower leg: Edema (1+) present.  Skin:    Coloration: Skin is pale.  Neurological:     General: No focal deficit present.     Mental Status: He is oriented to person, place, and time.  Psychiatric:        Mood and Affect: Mood normal.        Behavior: Behavior normal.     BP (!) 151/63   Pulse 69   Temp (!) 97.2 F (36.2 C) (Temporal)   Ht 5' 7 (1.702 m)   Wt 216 lb (98 kg)   SpO2 100%   BMI 33.83 kg/m        Assessment & Plan:   Ricardo JONETTA Etha Mickey. in today with chief complaint of Transitions Of Care   1. Transition of care (Primary) Hospital records reviewed  2. New onset of  congestive heart failure (HCC) Continue all new meds Keep follow up with cardiology and nephrology - BMP8+EGFR - Brain natriuretic peptide - CBC with Differential/Platelet  3. Hypertension associated with type 2 diabetes mellitus (HCC) Continue  to keep a diary of blood pressures at ome and take reading with you to see cardiology    The above assessment and management plan was discussed with the patient. The patient verbalized understanding of and has agreed to the management plan. Patient is aware to call the clinic if symptoms persist or worsen. Patient is aware when to return to the clinic for a follow-up visit. Patient educated on when it is appropriate to go to the emergency  department.   Mary-Margaret Gladis, FNP    "

## 2024-08-16 NOTE — Telephone Encounter (Signed)
 Left message giving verbal ok for physical therapy. Myles Blackbird to call the office if she needed anything further.

## 2024-08-16 NOTE — Telephone Encounter (Signed)
 Copied from CRM #[id number]. Topic: Clinical - Home Health Verbal Orders >> Aug 16, 2024  9:45 AM Graeme ORN wrote: Caller/Agency: Harlene Gaba [facility] (Provider) Callback Number: [phone] secure vm  Service Requested: Physical Therapy Frequency: 1 week 1, 2 week 3, 1 week 4  Any new concerns about the patient? No

## 2024-08-17 LAB — CBC WITH DIFFERENTIAL/PLATELET
Basophils Absolute: 0 x10E3/uL (ref 0.0–0.2)
Basos: 0 %
EOS (ABSOLUTE): 0.1 x10E3/uL (ref 0.0–0.4)
Eos: 2 %
Hematocrit: 26.7 % — ABNORMAL LOW (ref 37.5–51.0)
Hemoglobin: 8.5 g/dL — CL (ref 13.0–17.7)
Immature Grans (Abs): 0 x10E3/uL (ref 0.0–0.1)
Immature Granulocytes: 0 %
Lymphocytes Absolute: 1.4 x10E3/uL (ref 0.7–3.1)
Lymphs: 19 %
MCH: 30.6 pg (ref 26.6–33.0)
MCHC: 31.8 g/dL (ref 31.5–35.7)
MCV: 96 fL (ref 79–97)
Monocytes Absolute: 0.6 x10E3/uL (ref 0.1–0.9)
Monocytes: 8 %
Neutrophils Absolute: 5.1 x10E3/uL (ref 1.4–7.0)
Neutrophils: 71 %
Platelets: 154 x10E3/uL (ref 150–450)
RBC: 2.78 x10E6/uL — ABNORMAL LOW (ref 4.14–5.80)
RDW: 14.5 % (ref 11.6–15.4)
WBC: 7.3 x10E3/uL (ref 3.4–10.8)

## 2024-08-17 LAB — BMP8+EGFR
BUN/Creatinine Ratio: 17 (ref 10–24)
BUN: 92 mg/dL — AB (ref 8–27)
CO2: 17 mmol/L — AB (ref 20–29)
Calcium: 8.8 mg/dL (ref 8.6–10.2)
Chloride: 105 mmol/L (ref 96–106)
Creatinine, Ser: 5.5 mg/dL — AB (ref 0.76–1.27)
Glucose: 93 mg/dL (ref 70–99)
Potassium: 5.3 mmol/L — AB (ref 3.5–5.2)
Sodium: 138 mmol/L (ref 134–144)
eGFR: 10 mL/min/1.73 — AB

## 2024-08-17 LAB — BRAIN NATRIURETIC PEPTIDE: BNP: 506.7 pg/mL — AB (ref 0.0–100.0)

## 2024-08-19 ENCOUNTER — Ambulatory Visit: Payer: Self-pay | Admitting: Nurse Practitioner

## 2024-08-19 ENCOUNTER — Telehealth: Payer: Self-pay

## 2024-08-19 DIAGNOSIS — E875 Hyperkalemia: Secondary | ICD-10-CM

## 2024-08-19 NOTE — Telephone Encounter (Addendum)
 Calling to report critical lab, Hemoglobin 8.5 Creatinine 5.50 BUN 92

## 2024-08-19 NOTE — Progress Notes (Signed)
 " Cardiology Office Note:   Date:  08/19/2024  ID:  Royetta JONETTA Etha Mickey., DOB 1941-03-12, MRN [id number] PCP: [name], DO  [facility] Providers Cardiologist:  Georganna Archer, MD { Chief Complaint:  Chief Complaint  Patient presents with   Congestive Heart Failure      History of Present Illness:   Carla Whilden. is a 84 y.o. male with a PMH of new HFrEF, aortic stenosis, TAA, HTN, HLD, DM2, CKD, IDA who presents for follow up.  I met Mr. Berrett when he was hospitalized last month for new decompensated CHF.  Admitted and found to have a EF of 30-35% along with severe renal dysfunction and aortic stenosis.  He was aggressively diuresed start the patient on GDMT; however, his GDMT for CHF is very limited due to his advanced CKD.  Since discharge the patient says that he still feels dyspnea on exertion.  He denies chest pain, swelling, PND, orthopnea.  He is taking all of his medications as prescribed and is still making urine.  He is accompanied today by his lovely wife.  No other complaints.   Past Medical History:  Diagnosis Date   Adenomatous polyp of colon    Arthritis    bilateral knees   Atypical mole 2016   left shoulder sever wider shave   BCC (basal cell carcinoma of skin) 2011   left neck tx mohs   BCC (basal cell carcinoma of skin) 2015   left sideburn tx with bx   BCC (basal cell carcinoma of skin) 2015   right post shoulder tx with bx   BCC (basal cell carcinoma of skin) 2016   left chest tx with bx   BCC (basal cell carcinoma of skin) 2017   left cheek cx3 [id number]   Cataract    CKD (chronic kidney disease), stage II    Dr. 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 Georgina.PCP   History of basal cell cancer 2011   right inner eye tx mohs   History of DVT of lower extremity    2011--  LEFT LEG   History of kidney stones    History of melanoma  excision    RIGHT ARM   Hyperlipidemia    Hypertension    Impaired hearing    hearing aids bilateral   Macular degeneration    Melanoma (HCC) 1990   arm and back per patient  -Melanoma   Melanoma of skin (HCC) 10/09/2013   Near syncope 08/25/2015   Dr. Ida oncefound blood clots in lung   Nocturia    Prostate cancer [facility]) 2014   Dr. Watt manuel- seed implant '14   TIA (transient ischemic attack) 04/08/2016   Ulcer of left lower leg (HCC)    recent pcp visit dr georgina--  ordered betadine  wet/dry dsg daily (03-18-2013)  CONSULT WITH WOUND CARE CENTER IN 1 WEEK     Studies Reviewed:    EKG: No new ECG       Cardiac Studies & Procedures   ______________________________________________________________________________________________   STRESS TESTS  MYOCARDIAL PERFUSION IMAGING 06/16/2015  Interpretation Summary  The left ventricular ejection fraction is mildly decreased (45-54%).  Nuclear stress EF: 52%.  No T wave inversion was noted during stress.  There was no ST segment deviation noted during stress.  Defect 1: There is a small defect of mild severity.  Small mild fixed apical attenuation artifact. No significant reversible ischemia. LVEF 52%. This is a low risk study.   ECHOCARDIOGRAM  ECHOCARDIOGRAM COMPLETE 07/25/2024  Narrative ECHOCARDIOGRAM REPORT    Patient Name:   Recardo Linn. Date of Exam: 07/25/2024 Medical Rec #:  [id number]          Height:       67.0 in Accession #:    [id number]         Weight:       232.0 lb Date of Birth:  [birth year]           BSA:          2.154 m Patient Age:    83 years           BP:           148/62 mmHg Patient Gender: M                  HR:           93 bpm. Exam Location:  [place]  Procedure: 2D Echo, Cardiac Doppler and Color Doppler (Both Spectral and Color Flow Doppler were utilized during procedure).  Indications:    R06.02 SOB  History:        Patient has prior history of Echocardiogram  examinations, most recent 06/16/2015. Aortic Valve Disease, Signs/Symptoms:Dyspnea; Risk Factors:Obesity, Hypertension, Diabetes, Dyslipidemia and Non-Smoker.  Sonographer:    Bascom Burows RCS, RVS Referring Phys: [phone] JOSHUA A DETTINGER  IMPRESSIONS   1. Left ventricular ejection fraction, by estimation, is 30 to 35%. The left ventricle has moderately decreased function. Left ventricular endocardial border not optimally defined to evaluate regional wall motion. There is mild left ventricular hypertrophy. Left ventricular diastolic parameters are consistent with Grade I diastolic dysfunction (impaired relaxation). 2. Right ventricular systolic function is normal. The right ventricular size is normal. Tricuspid regurgitation signal is inadequate for assessing PA pressure. 3. The mitral valve is abnormal. Mild mitral valve regurgitation. Mild mitral stenosis. The mean mitral valve gradient is 3.0 mmHg. Severe mitral annular calcification. 4. 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 Calcified and restricted right coronary cusp. Moderate restriction in remaining leaflet mobility. Aortic valve regurgitation is moderate. Mild aortic valve stenosis based on gradients but likely moderate to severe aortic valve stenosis based on leaflet mobility. Conisder low flow-low gradient AS. Aortic regurgitation PHT measures 252 msec. Aortic valve area, by VTI measures 1.77 cm. Aortic valve mean gradient measures 17.0 mmHg. Aortic valve Vmax measures 2.76 m/s. 5. Aortic dilatation noted. There is mild dilatation of the aortic root, measuring 40 mm. There is mild dilatation of the ascending aorta, measuring 43 mm.  Comparison(s): A prior study was performed on 06/16/2015. EF 55-60%. Left atrium mild to moderately dilated. Calcified right coronay cusp. Aortiv valve sclerosis without stenosis. Mild dilated ascending aorta.  FINDINGS Left Ventricle: Left ventricular ejection fraction, by estimation, is 30 to  35%. The left ventricle has moderately decreased function. Left ventricular endocardial border not optimally defined to evaluate regional wall motion. Strain was performed and the global longitudinal strain is indeterminate. The left ventricular internal cavity size was normal in size. There is mild left ventricular hypertrophy. Left ventricular diastolic function could not be evaluated due to mitral annular calcification (moderate or greater). Left ventricular diastolic parameters are consistent with Grade I diastolic dysfunction (impaired relaxation).  Right Ventricle: The right ventricular size is normal. No increase in right ventricular wall thickness. Right ventricular systolic function is normal. Tricuspid regurgitation  signal is inadequate for assessing PA pressure.  Left Atrium: Left atrial size was normal in size.  Right Atrium: Right atrial size was normal in size.  Pericardium: There is no evidence of pericardial effusion.  Mitral Valve: The mitral valve is abnormal. Severe mitral annular calcification. Mild mitral valve regurgitation. Mild mitral valve stenosis. MV peak gradient, 8.9 mmHg. The mean mitral valve gradient is 3.0 mmHg.  Tricuspid Valve: The tricuspid valve is normal in structure. Tricuspid valve regurgitation is not demonstrated. No evidence of tricuspid stenosis.  Aortic Valve: The aortic valve is bicuspid. Aortic valve regurgitation is moderate. Aortic regurgitation PHT measures 252 msec. Mild aortic stenosis is present. Aortic valve mean gradient measures 17.0 mmHg. Aortic valve peak gradient measures 30.4 mmHg. Aortic valve area, by VTI measures 1.77 cm.  Pulmonic Valve: The pulmonic valve was normal in structure. Pulmonic valve regurgitation is not visualized. No evidence of pulmonic stenosis.  Aorta: Aortic dilatation noted. There is mild dilatation of the aortic root, measuring 40 mm. There is mild dilatation of the ascending aorta, measuring 43 mm.  Venous:  The inferior vena cava was not well visualized.  IAS/Shunts: No atrial level shunt detected by color flow Doppler.  Additional Comments: 3D was performed not requiring image post processing on an independent workstation and was indeterminate.   LEFT VENTRICLE PLAX 2D LVIDd:         6.30 cm      Diastology LVIDs:         4.80 cm      LV e' medial:    3.92 cm/s LV PW:         1.20 cm      LV E/e' medial:  37.0 LV IVS:        1.20 cm      LV e' lateral:   6.64 cm/s LVOT diam:     2.40 cm      LV E/e' lateral: 21.8 LV SV:         102 LV SV Index:   47 LVOT Area:     4.52 cm  LV Volumes (MOD) LV vol d, MOD A2C: 128.0 ml LV vol d, MOD A4C: 127.0 ml LV vol s, MOD A2C: 78.0 ml LV vol s, MOD A4C: 96.6 ml LV SV MOD A2C:     50.0 ml LV SV MOD A4C:     127.0 ml LV SV MOD BP:      41.8 ml  RIGHT VENTRICLE RV Basal diam:  2.90 cm     PULMONARY VEINS RV Mid diam:    2.90 cm     Diastolic Velocity: 72.60 cm/s RV S prime:     10.87 cm/s  S/D Velocity:       0.70 TAPSE (M-mode): 1.8 cm      Systolic Velocity:  53.90 cm/s  LEFT ATRIUM             Index        RIGHT ATRIUM           Index LA diam:        4.80 cm 2.23 cm/m   RA Area:     16.90 cm LA Vol (A2C):   51.8 ml 24.05 ml/m  RA Volume:   43.40 ml  20.15 ml/m LA Vol (A4C):   64.0 ml 29.71 ml/m LA Biplane Vol: 58.8 ml 27.30 ml/m AORTIC VALVE  PULMONIC VALVE AV Area (Vmax):    1.58 cm      PV Vmax:       1.32 m/s AV Area (Vmean):   1.44 cm      PV Peak grad:  6.9 mmHg AV Area (VTI):     1.77 cm AV Vmax:           275.50 cm/s AV Vmean:          197.000 cm/s AV VTI:            0.575 m AV Peak Grad:      30.4 mmHg AV Mean Grad:      17.0 mmHg LVOT Vmax:         96.20 cm/s LVOT Vmean:        62.900 cm/s LVOT VTI:          0.225 m LVOT/AV VTI ratio: 0.39 AI PHT:            252 msec AR Vena Contracta: 0.90 cm  AORTA Ao Root diam: 4.00 cm Ao Asc diam:  4.30 cm  MITRAL VALVE MV Area (PHT): 6.60 cm      SHUNTS MV Area VTI:   2.80 cm     Systemic VTI:  0.22 m MV Peak grad:  8.9 mmHg     Systemic Diam: 2.40 cm MV Mean grad:  3.0 mmHg MV Vmax:       1.49 m/s MV Vmean:      77.8 cm/s MV Decel Time: 115 msec MV E velocity: 145.00 cm/s MV A velocity: 142.00 cm/s MV E/A ratio:  1.02  Vishnu Priya Mallipeddi Electronically signed by Diannah Late Mallipeddi Signature Date/Time: 07/25/2024/3:52:16 PM    Final    MONITORS  CARDIAC EVENT MONITOR 07/28/2015       ______________________________________________________________________________________________      Risk Assessment/Calculations:           Physical Exam:     VS:  BP (!) 149/67   Pulse 78   Ht 5' 7 (1.702 m)   SpO2 99%   BMI 33.83 kg/m      Wt Readings from Last 3 Encounters:  08/16/24 216 lb (98 kg)  08/04/24 213 lb 12.8 oz (97 kg)  07/30/24 232 lb (105.2 kg)     GEN: Elderly gentleman in NAD NECK: No JVD; No carotid bruits CARDIAC: RRR, soft I/VI systolic murmur at RUSB, no rubs or gallops RESPIRATORY:  Clear to auscultation without rales, wheezing or rhonchi  ABDOMEN: Soft, non-tender, non-distended, normal bowel sounds EXTREMITIES:  Warm and well perfused, trace bilateral pitting edema; No deformity, 2+ radial pulses PSYCH: Normal mood and affect   Assessment & Plan   #New Chronic HFrEF - Patient recently hospitalized for new decompensated CHF found to have an LVEF 30-35%. - Unfortunately his renal dysfunction precludes him from initiating much GDMT. - He is currently on maximal Coreg  and I will increase his hydralazine  today. - Further, it is unclear if this is an ischemic cardiomyopathy or nonischemic cardiomyopathy because he cannot safely undergo LHC given his renal dysfunction. - I will perform a PET stress to assess for ischemia. - Furthermore, given the patient's limited options for heart failure therapies, I will refer him to advanced heart failure clinic to see if there are any  additional options for treatment that I may not be thinking of. - Will repeat an echocardiogram in 3 months to see if there is improvement in his EF. PET stress test to look for ischemia  Refer to advanced heart failure for additional assessment Repeat TTE in 3 months Continue Coreg  25 mg twice daily Increase hydralazine  to 50 mg 3 times daily Follow-up me in 3 months   #Moderate Aortic Stenosis - TTE demonstrated calcification of the aortic valve and gradients mostly consistent with mild aortic stenosis.  However, there was question about whether or not the severity of his AAS was actually worse given that he has significant CHF. - Given that the cause of his HFrEF is undifferentiated at this time, I think is important to figure out if he has significant valvular disease that may be worsening his heart failure. - I will perform an aortic calcium  score to help tease out the severity of his AS. Repeat echocardiogram in 3 months Aortic valve calcium  score  #HTN - Blood pressure remains too high. - Will increase his hydralazine  as above  #TAA - Noted to have mild dilation of the ascending aorta to 4.3 cm on echocardiogram. - The patient cannot safely receive IV contrast so will not perform a CTA of the aorta. - Will need serial monitoring of aortic diameter by echocardiography.  #CKD - The patient has advanced CKD and follows with nephrology. - Will avoid nephrotoxic drugs as much as possible.     Informed Consent   Shared Decision Making/Informed Consent The risks [chest pain, shortness of breath, cardiac arrhythmias, dizziness, blood pressure fluctuations, myocardial infarction, stroke/transient ischemic attack, nausea, vomiting, allergic reaction, radiation exposure, metallic taste sensation and life-threatening complications (estimated to be 1 in 10,000)], benefits (risk stratification, diagnosing coronary artery disease, treatment guidance) and alternatives of a cardiac PET stress  test were discussed in detail with Mr. Bartling and he agrees to proceed.      This note was written with the assistance of a dictation microphone or AI dictation software. Please excuse any typos or grammatical errors.   Signed, Georganna Archer, MD 08/19/2024 5:52 PM     [facility]  "

## 2024-08-19 NOTE — Telephone Encounter (Signed)
 CKD looks to be chronic and the hemoglobin looks to be around where his chronic levels are.

## 2024-08-19 NOTE — Telephone Encounter (Signed)
 Labs addressed in separate encounter. LS

## 2024-08-20 ENCOUNTER — Encounter: Payer: Self-pay | Admitting: Student in an Organized Health Care Education/Training Program

## 2024-08-20 ENCOUNTER — Ambulatory Visit
Attending: Student in an Organized Health Care Education/Training Program | Admitting: Student in an Organized Health Care Education/Training Program

## 2024-08-20 VITALS — BP 149/67 | HR 78 | Ht 67.0 in

## 2024-08-20 DIAGNOSIS — I5020 Unspecified systolic (congestive) heart failure: Secondary | ICD-10-CM

## 2024-08-20 DIAGNOSIS — I359 Nonrheumatic aortic valve disorder, unspecified: Secondary | ICD-10-CM | POA: Diagnosis not present

## 2024-08-20 MED ORDER — HYDRALAZINE HCL 50 MG PO TABS: 50.0000 mg | ORAL_TABLET | Freq: Three times a day (TID) | ORAL | 3 refills | Status: AC

## 2024-08-20 MED ORDER — HYDRALAZINE HCL 50 MG PO TABS: 50.0000 mg | ORAL_TABLET | Freq: Three times a day (TID) | ORAL | 3 refills | Status: DC

## 2024-08-20 NOTE — Patient Instructions (Signed)
 Medication Instructions:  INCREASE Hydralazine  to 50 mg three times a day   *If you need a refill on your cardiac medications before your next appointment, please call your pharmacy*  Testing/Procedures: CALCIUM  SCORE   Your provider would like for you to have a Calcium  Score CT. This test is painless. This is a non-contrast CT of the heat to look for calcified lesions in the coronary arteries. The cost of this is test cost $99 out of pocket and is not submitted to your insurance. The test can be performed at our [facility] Location in [place].  ECHOCARDIOGRAM IN 3 MONTHS  Your physician has requested that you have an echocardiogram. Echocardiography is a painless test that uses sound waves to create images of your heart. It provides your doctor with information about the size and shape of your heart and how well your hearts chambers and valves are working. This procedure takes approximately one hour. There are no restrictions for this procedure. Please do NOT wear cologne, perfume, aftershave, or lotions (deodorant is allowed). Please arrive 15 minutes prior to your appointment time.  Please note: We ask at that you not bring children with you during ultrasound (echo/ vascular) testing. Due to room size and safety concerns, children are not allowed in the ultrasound rooms during exams. Our front office staff cannot provide observation of children in our lobby area while testing is being conducted. An adult accompanying a patient to their appointment will only be allowed in the ultrasound room at the discretion of the ultrasound technician under special circumstances. We apologize for any inconvenience.  PET/CT STRESS TEST   How to Prepare for Your Cardiac PET/CT Stress Test:  1. Please do not take these medications before your test:  ~Medications that may interfere with the cardiac pharmacological stress agent (ex. nitrates - including erectile dysfunction medications,  isosorbide  mononitrate, tamulosin or beta-blockers) the day of the exam. (Erectile dysfunction medication should be held for at least 72 hrs prior to test) ~Theophylline containing medications for 12 hours. ~Dipyridamole 48 hours prior to the test. ~Your remaining medications may be taken with water .  2. Nothing to eat or drink, except water , 3 hours prior to arrival time.   ~ NO caffeine/decaffeinated products, or chocolate 12 hours prior to arrival.  3. NO perfume, cologne or lotion on chest or abdomen area.         - FEMALES - Please avoid wearing dresses to this appointment.  4. Total time is 1 to 2 hours; you may want to bring reading material for the waiting time.  Please report to Radiology at the [facility] Main Entrance 30 minutes early for your test. [address] [place], [place] [address]  OR  Please report to Radiology at [facility] Main Entrance, medical mall, 30 mins prior to your test. [address] [place], [place] [phone]  Diabetic Preparation: - Hold oral medications. - You may take NPH and Lantus insulin . - Do not take Humalog or Humulin R  (Regular Insulin ) the day of your test. - Check blood sugars prior to leaving the house. - If able to eat breakfast prior to 3 hour fasting, you may take all medications, including your insulin , - Do not worry if you miss your breakfast dose of insulin  - start at your next meal. - Patients who wear a continuous glucose monitor MUST remove the device prior to scanning.  IF YOU THINK YOU MAY BE PREGNANT, OR ARE NURSING PLEASE INFORM  THE TECHNOLOGIST.  In preparation for your appointment, medication and supplies will be purchased.  Appointment availability is limited, so if you need to cancel or reschedule, please call the Radiology Department at [phone] Geroge Law) OR [phone] [facility])  24 hours in advance to avoid a cancellation fee of $100.00  What to Expect After you  Arrive:  Once you arrive and check in for your appointment, you will be taken to a preparation room within the Radiology Department.  A technologist or Nurse will obtain your medical history, verify that you are correctly prepped for the exam, and explain the procedure.  Afterwards,  an IV will be started in your arm and electrodes will be placed on your skin for EKG monitoring during the stress portion of the exam. Then you will be escorted to the PET/CT scanner.  There, staff will get you positioned on the scanner and obtain a blood pressure and EKG.  During the exam, you will continue to be connected to the EKG and blood pressure machines.  A small, safe amount of a radioactive tracer will be injected in your IV to obtain a series of pictures of your heart along with an injection of a stress agent.    After your Exam:  It is recommended that you eat a meal and drink a caffeinated beverage to counter act any effects of the stress agent.  Drink plenty of fluids for the remainder of the day and urinate frequently for the first couple of hours after the exam.  Your doctor will inform you of your test results within 7-10 business days.  For more information and frequently asked questions, please visit our website : [email or web address]  For questions about your test or how to prepare for your test, please call: Cardiac Imaging Nurse Navigators Office: [phone]    REFERRAL TO HEART FAILURE CLINIC   Follow-Up: At [facility], you and your health needs are our priority.  As part of our continuing mission to provide you with exceptional heart care, our providers are all part of one team.  This team includes your primary Cardiologist (physician) and Advanced Practice Providers or APPs (Physician Assistants and Nurse Practitioners) who all work together to provide you with the care you need, when you need it.  Your next appointment:   3 month(s)  Provider:   Georganna Archer, MD

## 2024-08-21 ENCOUNTER — Ambulatory Visit (HOSPITAL_COMMUNITY)
Admission: RE | Admit: 2024-08-21 | Discharge: 2024-08-21 | Disposition: A | Discharge status: Home / Self Care | Payer: Self-pay | Source: Ambulatory Visit | Attending: Internal Medicine | Admitting: Internal Medicine

## 2024-08-21 DIAGNOSIS — I359 Nonrheumatic aortic valve disorder, unspecified: Secondary | ICD-10-CM | POA: Insufficient documentation

## 2024-08-26 ENCOUNTER — Ambulatory Visit: Payer: Self-pay | Admitting: Student in an Organized Health Care Education/Training Program

## 2024-08-26 ENCOUNTER — Other Ambulatory Visit

## 2024-08-26 DIAGNOSIS — E875 Hyperkalemia: Secondary | ICD-10-CM

## 2024-08-27 ENCOUNTER — Ambulatory Visit: Payer: Self-pay | Admitting: Nurse Practitioner

## 2024-08-27 LAB — BASIC METABOLIC PANEL WITH GFR
BUN/Creatinine Ratio: 17 (ref 10–24)
BUN: 94 mg/dL (ref 8–27)
CO2: 17 mmol/L — AB (ref 20–29)
Calcium: 9 mg/dL (ref 8.6–10.2)
Chloride: 104 mmol/L (ref 96–106)
Creatinine, Ser: 5.48 mg/dL (ref 0.76–1.27)
Glucose: 95 mg/dL (ref 70–99)
Potassium: 4.7 mmol/L (ref 3.5–5.2)
Sodium: 136 mmol/L (ref 134–144)
eGFR: 10 mL/min/1.73 — ABNORMAL LOW

## 2024-08-28 ENCOUNTER — Telehealth: Payer: Self-pay | Admitting: Student in an Organized Health Care Education/Training Program

## 2024-08-28 LAB — OPHTHALMOLOGY REPORT-SCANNED

## 2024-08-28 NOTE — Telephone Encounter (Signed)
" °*  STAT* If patient is at the pharmacy, call can be transferred to refill team.   1. Which medications need to be refilled? (please list name of each medication and dose if known)   torsemide  (DEMADEX ) 20 MG tablet     2. Would you like to learn more about the convenience, safety, & potential cost savings by using the [facility]? No   3. Are you open to using the [facility] (Type [facility] ) No   4. Which pharmacy/location (including street and city if local pharmacy) is medication to be sent to?  [id number] - [place], [place] - [address]   5. Do they need a 30 day or 90 day supply? 90 day  "

## 2024-08-30 ENCOUNTER — Telehealth: Payer: Self-pay

## 2024-08-30 ENCOUNTER — Other Ambulatory Visit (HOSPITAL_COMMUNITY): Payer: Self-pay

## 2024-08-30 MED ORDER — TORSEMIDE 20 MG PO TABS: 40.0000 mg | ORAL_TABLET | Freq: Every day | ORAL | 0 refills | Status: AC

## 2024-08-30 MED ORDER — BLOOD GLUCOSE TEST VI STRP: 1.0000 | ORAL_STRIP | 0 refills | Status: AC

## 2024-08-30 MED ORDER — LANCET DEVICE MISC: 1.0000 | 0 refills | Status: AC

## 2024-08-30 MED ORDER — BLOOD GLUCOSE MONITORING SUPPL DEVI: 1.0000 | 0 refills | Status: AC

## 2024-08-30 MED ORDER — LANCETS MISC: 1.0000 | 0 refills | Status: AC

## 2024-08-30 NOTE — Telephone Encounter (Signed)
 Meter and supplies sent to the requested pharmacy.

## 2024-08-30 NOTE — Telephone Encounter (Signed)
 Copied from CRM [id number]. Topic: Clinical - Order For Equipment >> Aug 30, 2024 12:27 PM Wess RAMAN wrote: Reason for CRM: Damien from [facility] stated patient needs a blood glucose monitor.   Callback #: [id number]  Pharmacy: [facility] [address], [place] - [address] [place]  [id number] Phone: [phone] Fax: [phone] Hours: Not open 24 hours

## 2024-08-30 NOTE — Addendum Note (Signed)
 Addended by: MICHELINE ROSINA FALCON on: 08/30/2024 01:28 PM   Modules accepted: Orders

## 2024-09-10 ENCOUNTER — Ambulatory Visit (INDEPENDENT_AMBULATORY_CARE_PROVIDER_SITE_OTHER)

## 2024-09-10 VITALS — BP 149/67 | HR 78 | Ht 67.0 in | Wt 216.0 lb

## 2024-09-10 DIAGNOSIS — Z0000 Encounter for general adult medical examination without abnormal findings: Secondary | ICD-10-CM

## 2024-09-10 NOTE — Patient Instructions (Signed)
 [name],  Thank you for taking the time for your Medicare Wellness Visit. I appreciate your continued commitment to your health goals. Please review the care plan we discussed, and feel free to reach out if I can assist you further.  Please note that Annual Wellness Visits do not include a physical exam. Some assessments may be limited, especially if the visit was conducted virtually. If needed, we may recommend an in-person follow-up with your provider.  Ongoing Care Seeing your primary care provider every 3 to 6 months helps us  monitor your health and provide consistent, personalized care.   Referrals If a referral was made during today's visit and you haven't received any updates within two weeks, please contact the referred provider directly to check on the status.  Recommended Screenings:  Health Maintenance  Topic Date Due   Colon Cancer Screening  12/17/2023   COVID-19 Vaccine (8 - 2025-26 season) 04/15/2024   Medicare Annual Wellness Visit  08/20/2024   Zoster (Shingles) Vaccine (1 of 2) 10/28/2024*   Hemoglobin A1C  01/29/2025   Eye exam for diabetics  02/19/2025   Complete foot exam   06/05/2025   DTaP/Tdap/Td vaccine (3 - Td or Tdap) 05/16/2033   Pneumococcal Vaccine for age over 76  Completed   Flu Shot  Completed   Meningitis B Vaccine  Aged Out   Hepatitis C Screening  Discontinued  *Topic was postponed. The date shown is not the original due date.       09/10/2024    1:26 PM  Advanced Directives  Does Patient Have a Medical Advance Directive? Yes  Type of Estate Agent of [place];Living will  Copy of Healthcare Power of Attorney in Chart? Yes - validated most recent copy scanned in chart (See row information)    Vision: Annual vision screenings are recommended for early detection of glaucoma, cataracts, and diabetic retinopathy. These exams can also reveal signs of chronic conditions such as diabetes and high blood pressure.  Dental:  Annual dental screenings help detect early signs of oral cancer, gum disease, and other conditions linked to overall health, including heart disease and diabetes.  Please see the attached documents for additional preventive care recommendations.

## 2024-09-10 NOTE — Progress Notes (Addendum)
 "  Chief Complaint  Patient presents with   Medicare Wellness     Subjective:   Ricardo Mayer. is a 84 y.o. male who presents for a Medicare Annual Wellness Visit.  Visit info / Clinical Intake: Medicare Wellness Visit Type:: Subsequent Annual Wellness Visit Persons participating in visit and providing information:: patient & caregiver (pt's wife/son-Lucus assist w/awv per pt verbalized consent) Medicare Wellness Visit Mode:: Telephone If telephone:: video declined Since this visit was completed virtually, some vitals may be partially provided or unavailable. Missing vitals are due to the limitations of the virtual format.: Unable to obtain vitals - no equipment If Telephone or Video please confirm:: I connected with patient using audio/video enable telemedicine. I verified patient identity with two identifiers, discussed telehealth limitations, and patient agreed to proceed. Patient Location:: home Provider Location:: office Interpreter Needed?: No Pre-visit prep was completed: yes AWV questionnaire completed by patient prior to visit?: no Living arrangements:: with family/others (pt's son/family) Patient's Overall Health Status Rating: good (pt has CHF) Typical amount of pain: none Does pain affect daily life?: no Are you currently prescribed opioids?: (!) yes  Dietary Habits and Nutritional Risks How many meals a day?: 2 Eats fruit and vegetables daily?: yes Most meals are obtained by: preparing own meals In the last 2 weeks, have you had any of the following?: none Diabetic:: no  Functional Status Activities of Daily Living (to include ambulation/medication): (!) Needs Assist Feeding: Independent Dressing/Grooming: Independent Bathing: Independent Toileting: Independent Transfer: Independent with device- listed below (pt's) Ambulation: Independent with device- listed below Home Assistive Devices/Equipment: Walker (specify Type); Cane Medication Administration:  Independent Home Management (perform basic housework or laundry): Independent Manage your own finances?: yes Primary transportation is: family / friends Concerns about vision?: no *vision screening is required for WTM* (2025) Concerns about hearing?: (!) yes Uses hearing aids?: (!) yes  Fall Screening Falls in the past year?: 1 Number of falls in past year: 1 Was there an injury with Fall?: 1 Fall Risk Category Calculator: 3 Patient Fall Risk Level: High Fall Risk  Fall Risk Patient at Risk for Falls Due to: Impaired balance/gait; Impaired mobility Fall risk Follow up: Falls evaluation completed; Education provided  Home and Transportation Safety: All rugs have non-skid backing?: yes All stairs or steps have railings?: yes Grab bars in the bathtub or shower?: yes Have non-skid surface in bathtub or shower?: yes Good home lighting?: yes Regular seat belt use?: yes Hospital stays in the last year:: no  Cognitive Assessment Difficulty concentrating, remembering, or making decisions? : no Will 6CIT or Mini Cog be Completed: no (did not do test pt is extemly hard of hearing, pt's son stated that pt is able to answer questions appropriately and can recall events.) 6CIT or Mini Cog Declined: patient alert, oriented, able to answer questions appropriately and recall recent events  Advance Directives (For Healthcare) Does Patient Have a Medical Advance Directive?: Yes Does patient want to make changes to medical advance directive?: Yes (Inpatient - patient defers changing a medical advance directive at this time - Information given) Type of Advance Directive: Healthcare Power of [place]; Living will Copy of Healthcare Power of Attorney in Chart?: Yes - validated most recent copy scanned in chart (See row information) Copy of Living Will in Chart?: Yes - validated most recent copy scanned in chart (See row information) Would patient like information on creating a medical advance  directive?: No - Patient declined  Reviewed/Updated  Reviewed/Updated: Reviewed All (Medical, Surgical, Family,  Medications, Allergies, Care Teams, Patient Goals)    Allergies (verified) Patient has no known allergies.   Current Medications (verified) Outpatient Encounter Medications as of 09/10/2024  Medication Sig   albuterol  (VENTOLIN  HFA) 108 (90 Base) MCG/ACT inhaler Inhale 2 puffs into the lungs every 6 (six) hours as needed for wheezing or shortness of breath.   aspirin  325 MG tablet Take 1 tablet (325 mg total) by mouth daily.   atorvastatin  (LIPITOR) 10 MG tablet Take 1 tablet (10 mg total) by mouth daily.   azelastine  (ASTELIN ) 0.1 % nasal spray Place 1 spray into both nostrils 2 (two) times daily. For runny nose   Blood Glucose Monitoring Suppl DEVI 1 each by Does not apply route as directed. Dispense based on patient and insurance preference. Use up to four times daily as directed. (FOR ICD-10 E10.9, E11.9).   carvedilol  (COREG ) 25 MG tablet Take 1 tablet (25 mg total) by mouth 2 (two) times daily with a meal. Hold if systolic blood pressure (top number) less than or heart rate less than 60   cholecalciferol (VITAMIN D3) 25 MCG (1000 UT) tablet Take 1,000 Units by mouth daily.   doxazosin  (CARDURA ) 1 MG tablet Take 1 mg by mouth at bedtime.   Glucose Blood (BLOOD GLUCOSE TEST STRIPS) STRP 1 each by Does not apply route as directed. Dispense based on patient and insurance preference. Use up to four times daily as directed. (FOR ICD-10 E10.9, E11.9).   hydrALAZINE  (APRESOLINE ) 50 MG tablet Take 1 tablet (50 mg total) by mouth 3 (three) times daily.   HYDROcodone -acetaminophen  (NORCO/VICODIN) 5-325 MG tablet Take 1 tablet by mouth every 6 (six) hours as needed for severe pain (pain score 7-10).   Lancet Device MISC 1 each by Does not apply route as directed. Dispense based on patient and insurance preference. Use up to four times daily as directed. (FOR ICD-10 E10.9, E11.9).    Lancets MISC 1 each by Does not apply route as directed. Dispense based on patient and insurance preference. Use up to four times daily as directed. (FOR ICD-10 E10.9, E11.9).   lidocaine  (LIDODERM ) 5 % Place 1 patch onto the skin daily. Remove & Discard patch within 12 hours or as directed by MD   loratadine  (CLARITIN ) 10 MG tablet Take 1 tablet EVERY OTHER day.  RENAL DOSING!!! Please reiterate this.   sodium zirconium cyclosilicate  (LOKELMA ) 10 g PACK packet Take 10 g by mouth daily.   torsemide  (DEMADEX ) 20 MG tablet Take 2 tablets (40 mg total) by mouth daily. Hold if systolic blood pressure (top number) is < 120 mmHg.   benzonatate  (TESSALON ) 100 MG capsule Take 1 capsule (100 mg total) by mouth 2 (two) times daily as needed for cough. (Patient not taking: Reported on 09/10/2024)   No facility-administered encounter medications on file as of 09/10/2024.    History: Past Medical History:  Diagnosis Date   Adenomatous polyp of colon    Arthritis    bilateral knees   Atypical mole 2016   left shoulder sever wider shave   BCC (basal cell carcinoma of skin) 2011   left neck tx mohs   BCC (basal cell carcinoma of skin) 2015   left sideburn tx with bx   BCC (basal cell carcinoma of skin) 2015   right post shoulder tx with bx   BCC (basal cell carcinoma of skin) 2016   left chest tx with bx   BCC (basal cell carcinoma of skin) 2017   left cheek  cx3 [id number]   Cataract    CKD (chronic kidney disease), stage II    Dr. 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 Georgina.PCP   History of basal cell cancer 2011   right inner eye tx mohs   History of DVT of lower extremity    2011--  LEFT LEG   History of kidney stones    History of melanoma excision    RIGHT ARM   Hyperlipidemia    Hypertension    Impaired hearing    hearing aids bilateral   Macular degeneration    Melanoma (HCC) 1990    arm and back per patient  -Melanoma   Melanoma of skin (HCC) 10/09/2013   Near syncope 08/25/2015   Dr. Ida oncefound blood clots in lung   Nocturia    Prostate cancer [facility]) 2014   Dr. Watt manuel- seed implant '14   TIA (transient ischemic attack) 04/08/2016   Ulcer of left lower leg (HCC)    recent pcp visit dr georgina--  ordered betadine  wet/dry dsg daily (03-18-2013)  CONSULT WITH WOUND CARE CENTER IN 1 WEEK   Past Surgical History:  Procedure Laterality Date   CATARACT EXTRACTION W/PHACO Left 10/25/2021   Procedure: CATARACT EXTRACTION PHACO AND INTRAOCULAR LENS PLACEMENT (IOC);  Surgeon: Harrie Agent, MD;  Location: [facility];  Service: Ophthalmology;  Laterality: Left;  CDE: 24.87   CATARACT EXTRACTION W/PHACO Right 04/19/2024   Procedure: PHACOEMULSIFICATION, CATARACT, WITH IOL INSERTION;  Surgeon: Harrie Agent, MD;  Location: [facility];  Service: Ophthalmology;  Laterality: Right;  CDE: 11.49   COLONOSCOPY     EYE SURGERY     INSERTION, STENT, DRUG-ELUTING, LACRIMAL CANALICULUS Right 04/19/2024   Procedure: INSERTION, STENT, DRUG-ELUTING, LACRIMAL CANALICULUS;  Surgeon: Harrie Agent, MD;  Location: [facility];  Service: Ophthalmology;  Laterality: Right;   JOINT REPLACEMENT     KIDNEY STONE EXTRACTIONS  LAST ONE 1970   X2 OPEN/  X1  URETEROSCOPY   MELANOMA EXCISION RIGHT ARM  1990   POLYPECTOMY     PROSTATE BIOPSY     RADIOACTIVE SEED IMPLANT N/A 03/21/2013   Procedure: RADIOACTIVE SEED IMPLANT;  Surgeon: Norleen JINNY Watt, MD;  Location: [facility];  Service: Urology;  Laterality: N/A;  87 seeds implanted     TONSILLECTOMY     TOTAL KNEE ARTHROPLASTY Right 03/21/2016   Procedure: RIGHT TOTAL KNEE ARTHROPLASTY;  Surgeon: Donnice Car, MD;  Location: [facility];  Service: Orthopedics;  Laterality: Right;   Family History  Problem Relation Age of Onset   Breast cancer Mother    Stroke Mother    Hypertension Mother    Lung cancer Father        smoked   Ulcers  Father    Heart disease Brother 29       stent   Esophageal cancer Brother    Cancer Paternal Uncle        prostate   Cancer Paternal Grandfather        prostate   Heart attack Neg Hx    Colon cancer Neg Hx    Colon polyps Neg Hx    Rectal cancer Neg Hx    Stomach cancer Neg Hx    Social History   Occupational History   Occupation: press photographer    Comment: retired  Tobacco Use   Smoking status: Never   Smokeless tobacco:  Never  Vaping Use   Vaping status: Never Used  Substance and Sexual Activity   Alcohol use: No   Drug use: No   Sexual activity: Not Currently   Tobacco Counseling Counseling given: Not Answered  SDOH Screenings   Food Insecurity: No Food Insecurity (09/10/2024)  Housing: Low Risk (09/10/2024)  Transportation Needs: No Transportation Needs (09/10/2024)  Utilities: Not At Risk (09/10/2024)  Alcohol Screen: Low Risk (08/20/2023)  Depression (PHQ2-9): Medium Risk (09/10/2024)  Financial Resource Strain: Low Risk (08/20/2023)  Physical Activity: Inactive (09/10/2024)  Social Connections: Socially Integrated (09/10/2024)  Stress: No Stress Concern Present (09/10/2024)  Tobacco Use: Low Risk (09/10/2024)  Health Literacy: Adequate Health Literacy (09/10/2024)   See flowsheets for full screening details  Depression Screen Depression Screening Exception Documentation Depression Screening Exception:: Patient refusal  PHQ 2 & 9 Depression Scale- Over the past 2 weeks, how often have you been bothered by any of the following problems? Little interest or pleasure in doing things: 0 Feeling down, depressed, or hopeless (PHQ Adolescent also includes...irritable): 1 PHQ-2 Total Score: 1 Trouble falling or staying asleep, or sleeping too much: 3 Feeling tired or having little energy: 3 Poor appetite or overeating (PHQ Adolescent also includes...weight loss): 1 Feeling bad about yourself - or that you are a failure or have let yourself or your family down:  0 Trouble concentrating on things, such as reading the newspaper or watching television (PHQ Adolescent also includes...like school work): 0 Moving or speaking so slowly that other people could have noticed. Or the opposite - being so fidgety or restless that you have been moving around a lot more than usual: 1 Thoughts that you would be better off dead, or of hurting yourself in some way: 0 PHQ-9 Total Score: 9 If you checked off any problems, how difficult have these problems made it for you to do your work, take care of things at home, or get along with other people?: Somewhat difficult  Depression Treatment Depression Interventions/Treatment : EYV7-0 Score <4 Follow-up Not Indicated     Goals Addressed             This Visit's Progress    Prevent falls               Objective:    Today's Vitals   09/10/24 1415  BP: (!) 149/67  Pulse: 78  Weight: 216 lb (98 kg)  Height: 5' 7 (1.702 m)   Body mass index is 33.83 kg/m.  Hearing/Vision screen No results found. Immunizations and Health Maintenance Health Maintenance  Topic Date Due   Colonoscopy  12/17/2023   COVID-19 Vaccine (8 - 2025-26 season) 04/15/2024   Zoster Vaccines- Shingrix (1 of 2) 10/28/2024 (Originally 02/17/1991)   HEMOGLOBIN A1C  01/29/2025   OPHTHALMOLOGY EXAM  02/19/2025   FOOT EXAM  06/05/2025   Medicare Annual Wellness (AWV)  09/10/2025   DTaP/Tdap/Td (3 - Td or Tdap) 05/16/2033   Pneumococcal Vaccine: 50+ Years  Completed   Influenza Vaccine  Completed   Meningococcal B Vaccine  Aged Out   Hepatitis C Screening  Discontinued        Assessment/Plan:  This is a routine wellness examination for Ricardo Mayer.  Patient Care Team: [name], DO as PCP - General (Family Medicine) Floretta Mallard, MD as PCP - Cardiology (Cardiology) Jenel Carlin POUR, MD (Inactive) as Consulting Physician (Neurology) Dann Candyce RAMAN, MD as Consulting Physician (Cardiology) Sheffield, Andrez SAUNDERS, PA-C  (Inactive) as Physician Assistant (Dermatology) Tobie Gordy POUR, MD  as Consulting Physician (Nephrology) Watt Rush, MD as Attending Physician (Urology)  I have personally reviewed and noted the following in the patients chart:   Medical and social history Use of alcohol, tobacco or illicit drugs  Current medications and supplements including opioid prescriptions. Functional ability and status Nutritional status Physical activity Advanced directives List of other physicians Hospitalizations, surgeries, and ER visits in previous 12 months Vitals Screenings to include cognitive, depression, and falls Referrals and appointments  No orders of the defined types were placed in this encounter.  In addition, I have reviewed and discussed with patient certain preventive protocols, quality metrics, and best practice recommendations. A written personalized care plan for preventive services as well as general preventive health recommendations were provided to patient.   Ozie Ned, CMA   09/10/2024   Return in 1 year (on 09/10/2025).  After Visit Summary: (MyChart) Due to this being a telephonic visit, the after visit summary with patients personalized plan was offered to patient via MyChart   Nurse Notes: Per pt declined colonscopy/aged out and Covid vaccines. "

## 2024-09-12 ENCOUNTER — Telehealth: Payer: Self-pay | Admitting: Student in an Organized Health Care Education/Training Program

## 2024-09-12 NOTE — Telephone Encounter (Signed)
 Spoke with patient's wife Janelle (OK per DPR). She reports patient has been experiencing SOB when getting up out of chair and moving around. She reports he is tired all the time, sleeping often in recliner. Denies any increased swelling, states his legs are always swollen. Symptoms have been present for 2 weeks now.  Janelle also reports patient starting burping a lot last night and is alarmed as these symptoms are similar to what happened before patient was hospitalized.  Patient also had a fall a few weeks ago. HH nurse assessed and dressed wounds where patient scraped his arm from fall.  Hart states they are currently snowed in at their son's house and is asking for an appt to reevaluate patient at the end of next week.  Will forward message to Dr. Floretta to review for further advisement.   Patient has cardiac PET scheduled for 2/3 and echo in April.

## 2024-09-12 NOTE — Telephone Encounter (Signed)
 Pt c/o Shortness Of Breath: STAT if SOB developed within the last 24 hours or pt is noticeably SOB on the phone  1. Are you currently SOB (can you hear that pt is SOB on the phone)?  Not on the phone with patient  2. How long have you been experiencing SOB?  Past few weeks, per wife  3. Are you SOB when sitting or when up moving around?  When up and moving around  4. Are you currently experiencing any other symptoms?  Tiredness. Wife mentions that patient had a fall a few weeks ago

## 2024-09-12 NOTE — Telephone Encounter (Signed)
 Pending review from Dr. Floretta.

## 2024-09-12 NOTE — Telephone Encounter (Signed)
 Left message to call back to schedule appointment with DOD or APP next week.

## 2024-09-13 ENCOUNTER — Ambulatory Visit

## 2024-09-13 ENCOUNTER — Encounter (HOSPITAL_COMMUNITY): Payer: Self-pay

## 2024-09-13 DIAGNOSIS — E1169 Type 2 diabetes mellitus with other specified complication: Secondary | ICD-10-CM

## 2024-09-13 DIAGNOSIS — E1122 Type 2 diabetes mellitus with diabetic chronic kidney disease: Secondary | ICD-10-CM

## 2024-09-13 DIAGNOSIS — H3530 Unspecified macular degeneration: Secondary | ICD-10-CM

## 2024-09-13 DIAGNOSIS — E7849 Other hyperlipidemia: Secondary | ICD-10-CM | POA: Diagnosis not present

## 2024-09-13 DIAGNOSIS — N185 Chronic kidney disease, stage 5: Secondary | ICD-10-CM | POA: Diagnosis not present

## 2024-09-13 DIAGNOSIS — I359 Nonrheumatic aortic valve disorder, unspecified: Secondary | ICD-10-CM | POA: Diagnosis not present

## 2024-09-13 DIAGNOSIS — D631 Anemia in chronic kidney disease: Secondary | ICD-10-CM

## 2024-09-13 DIAGNOSIS — I480 Paroxysmal atrial fibrillation: Secondary | ICD-10-CM

## 2024-09-13 DIAGNOSIS — M1712 Unilateral primary osteoarthritis, left knee: Secondary | ICD-10-CM | POA: Diagnosis not present

## 2024-09-13 DIAGNOSIS — I132 Hypertensive heart and chronic kidney disease with heart failure and with stage 5 chronic kidney disease, or end stage renal disease: Secondary | ICD-10-CM | POA: Diagnosis not present

## 2024-09-13 DIAGNOSIS — I5021 Acute systolic (congestive) heart failure: Secondary | ICD-10-CM | POA: Diagnosis not present

## 2024-09-13 DIAGNOSIS — E1151 Type 2 diabetes mellitus with diabetic peripheral angiopathy without gangrene: Secondary | ICD-10-CM

## 2024-09-13 NOTE — Telephone Encounter (Signed)
 Patien's wife was returning call. Please advise  Also, there is no APP appt for next week.

## 2024-09-13 NOTE — Telephone Encounter (Signed)
 Spoke with patient wife, no appointments available today. Offered appt for Monday, Tuesday, but due to inclement weather coming up and patient living further away. She opted for patient to be seen by DOD on 2/4. Appointment has been made and advised that if symptoms worsen to not hesitate to get evaluated. Agrees with plan of care.

## 2024-09-15 ENCOUNTER — Other Ambulatory Visit: Payer: Self-pay

## 2024-09-15 ENCOUNTER — Inpatient Hospital Stay (HOSPITAL_COMMUNITY)
Admission: EM | Admit: 2024-09-15 | Disposition: A | Discharge status: Skilled Nursing Facility | Source: Home / Self Care

## 2024-09-15 ENCOUNTER — Encounter (HOSPITAL_COMMUNITY): Payer: Self-pay | Admitting: *Deleted

## 2024-09-15 ENCOUNTER — Emergency Department (HOSPITAL_COMMUNITY)

## 2024-09-15 DIAGNOSIS — A419 Sepsis, unspecified organism: Secondary | ICD-10-CM

## 2024-09-15 DIAGNOSIS — I5023 Acute on chronic systolic (congestive) heart failure: Secondary | ICD-10-CM | POA: Diagnosis not present

## 2024-09-15 DIAGNOSIS — T68XXXA Hypothermia, initial encounter: Secondary | ICD-10-CM

## 2024-09-15 DIAGNOSIS — N185 Chronic kidney disease, stage 5: Secondary | ICD-10-CM

## 2024-09-15 DIAGNOSIS — I509 Heart failure, unspecified: Principal | ICD-10-CM

## 2024-09-15 DIAGNOSIS — B9561 Methicillin susceptible Staphylococcus aureus infection as the cause of diseases classified elsewhere: Secondary | ICD-10-CM | POA: Diagnosis present

## 2024-09-15 DIAGNOSIS — I350 Nonrheumatic aortic (valve) stenosis: Secondary | ICD-10-CM

## 2024-09-15 DIAGNOSIS — I5043 Acute on chronic combined systolic (congestive) and diastolic (congestive) heart failure: Secondary | ICD-10-CM | POA: Diagnosis present

## 2024-09-15 DIAGNOSIS — R7881 Bacteremia: Secondary | ICD-10-CM | POA: Diagnosis present

## 2024-09-15 HISTORY — DX: Heart failure, unspecified: I50.9

## 2024-09-15 LAB — CBC
HCT: 23.3 % — ABNORMAL LOW (ref 39.0–52.0)
Hemoglobin: 7.3 g/dL — ABNORMAL LOW (ref 13.0–17.0)
MCH: 30 pg (ref 26.0–34.0)
MCHC: 31.3 g/dL (ref 30.0–36.0)
MCV: 95.9 fL (ref 80.0–100.0)
Platelets: 105 10*3/uL — ABNORMAL LOW (ref 150–400)
RBC: 2.43 MIL/uL — ABNORMAL LOW (ref 4.22–5.81)
RDW: 16.3 % — ABNORMAL HIGH (ref 11.5–15.5)
WBC: 9.7 10*3/uL (ref 4.0–10.5)
nRBC: 0 % (ref 0.0–0.2)

## 2024-09-15 LAB — BASIC METABOLIC PANEL WITH GFR
Anion gap: 20 — ABNORMAL HIGH (ref 5–15)
BUN: 125 mg/dL — ABNORMAL HIGH (ref 8–23)
CO2: 12 mmol/L — ABNORMAL LOW (ref 22–32)
Calcium: 8.8 mg/dL — ABNORMAL LOW (ref 8.9–10.3)
Chloride: 109 mmol/L (ref 98–111)
Creatinine, Ser: 4.97 mg/dL — ABNORMAL HIGH (ref 0.61–1.24)
GFR, Estimated: 11 mL/min — ABNORMAL LOW
Glucose, Bld: 83 mg/dL (ref 70–99)
Potassium: 4.5 mmol/L (ref 3.5–5.1)
Sodium: 141 mmol/L (ref 135–145)

## 2024-09-15 LAB — RESP PANEL BY RT-PCR (RSV, FLU A&B, COVID)  RVPGX2
Influenza A by PCR: NEGATIVE
Influenza B by PCR: NEGATIVE
Resp Syncytial Virus by PCR: NEGATIVE
SARS Coronavirus 2 by RT PCR: NEGATIVE

## 2024-09-15 LAB — LACTIC ACID, PLASMA: Lactic Acid, Venous: 0.6 mmol/L (ref 0.5–1.9)

## 2024-09-15 LAB — PRO BRAIN NATRIURETIC PEPTIDE: Pro Brain Natriuretic Peptide: 22909 pg/mL — ABNORMAL HIGH

## 2024-09-15 MED ORDER — SODIUM BICARBONATE 8.4 % IV SOLN
50.0000 meq | Freq: Once | INTRAVENOUS | Status: AC
  Administered 2024-09-15: 50 meq via INTRAVENOUS
  Filled 2024-09-15: qty 50

## 2024-09-15 MED ORDER — HEPARIN SODIUM (PORCINE) 5000 UNIT/ML IJ SOLN
5000.0000 [IU] | Freq: Three times a day (TID) | INTRAMUSCULAR | Status: AC
  Administered 2024-09-15 – 2024-09-20 (×14): 5000 [IU] via SUBCUTANEOUS
  Filled 2024-09-15 (×14): qty 1

## 2024-09-15 MED ORDER — POLYETHYLENE GLYCOL 3350 17 G PO PACK: 17.0000 g | PACK | Freq: Every day | ORAL | Status: AC | PRN

## 2024-09-15 MED ORDER — FUROSEMIDE 10 MG/ML IJ SOLN
40.0000 mg | Freq: Once | INTRAMUSCULAR | Status: AC
  Administered 2024-09-15: 40 mg via INTRAVENOUS
  Filled 2024-09-15: qty 4

## 2024-09-15 MED ORDER — MELATONIN 3 MG PO TABS
6.0000 mg | ORAL_TABLET | Freq: Every evening | ORAL | Status: AC | PRN
  Administered 2024-09-18: 6 mg via ORAL
  Filled 2024-09-15: qty 2

## 2024-09-15 MED ORDER — PROCHLORPERAZINE EDISYLATE 10 MG/2ML IJ SOLN: 5.0000 mg | Freq: Four times a day (QID) | INTRAMUSCULAR | Status: AC | PRN

## 2024-09-15 MED ORDER — ACETAMINOPHEN 325 MG PO TABS
650.0000 mg | ORAL_TABLET | Freq: Four times a day (QID) | ORAL | Status: AC | PRN
  Administered 2024-09-16: 650 mg via ORAL
  Filled 2024-09-15 (×2): qty 2
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
## 2024-09-15 NOTE — H&P (Incomplete)
 " History and Physical  Ricardo Mayer. FMW:[id number] DOB: 1941-02-17 DOA: 09/15/2024  Referring physician: Dr. Towana, EDP  PCP: [name], DO  Outpatient Specialists: Cardiology. Patient coming from: Home.  Chief Complaint: Shortness of breath.  HPI: Ricardo Mayer. is a 84 y.o. 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  Also endorses a nonproductive cough.  No reported fevers or chills.  In the ER, afebrile with no leukocytosis.  Volume overload on exam.  proBNP greater than 22,000.  BUN 125, creatinine 4.97.  Serum bicarb 19, anion gap 20.  Lactic acid 0.6.  Hemoglobin 7.3.  MCV 95.  Platelet count 105.  The patient received IV Lasix  40 mg x 1 in the ER.  Admitted by [facility], hospitalist service.  ED Course: Temperature 98.2.  BP 115/43, pulse 53, respiratory rate 20, O2 saturation 100% on room air.  Review of Systems: Review of systems as noted in the HPI. All other systems reviewed and are negative.   Past Medical History:  Diagnosis Date   Adenomatous polyp of colon    Arthritis    bilateral knees   Atypical mole 2016   left shoulder sever wider shave   BCC (basal cell carcinoma of skin) 2011   left neck tx mohs   BCC (basal cell carcinoma of skin) 2015   left sideburn tx with bx   BCC (basal cell carcinoma of skin) 2015   right post shoulder tx with bx   BCC (basal cell carcinoma of skin) 2016   left chest tx with bx   BCC (basal cell carcinoma of skin) 2017   left cheek cx3 73fu   Cataract    CHF (congestive heart failure) (HCC)    CKD (chronic kidney disease), stage II    Dr. 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 Georgina.PCP    History of basal cell cancer 2011   right inner eye tx mohs   History of DVT of lower extremity    2011--  LEFT LEG   History of kidney stones    History of melanoma excision    RIGHT ARM   Hyperlipidemia    Hypertension    Impaired hearing    hearing aids bilateral   Macular degeneration    Melanoma (HCC) 1990   arm and back per patient  -Melanoma   Melanoma of skin (HCC) 10/09/2013   Near syncope 08/25/2015   Dr. Ida oncefound blood clots in lung   Nocturia    Prostate cancer [facility]) 2014   Dr. Watt manuel- seed implant '14   TIA (transient ischemic attack) 04/08/2016   Ulcer of left lower leg (HCC)    recent pcp visit dr georgina--  ordered betadine  wet/dry dsg daily (03-18-2013)  CONSULT WITH WOUND CARE CENTER IN 1 WEEK   Past Surgical History:  Procedure Laterality Date   CATARACT EXTRACTION W/PHACO Left 10/25/2021   Procedure: CATARACT EXTRACTION PHACO AND INTRAOCULAR LENS PLACEMENT (IOC);  Surgeon: Harrie Agent, MD;  Location: [facility];  Service: Ophthalmology;  Laterality: Left;  CDE: 24.87   CATARACT EXTRACTION W/PHACO Right 04/19/2024   Procedure: PHACOEMULSIFICATION, CATARACT, WITH IOL INSERTION;  Surgeon: Harrie Agent, MD;  Location:  [facility];  Service: Ophthalmology;  Laterality: Right;  CDE: 11.49   COLONOSCOPY     EYE SURGERY     INSERTION, STENT, DRUG-ELUTING, LACRIMAL CANALICULUS Right 04/19/2024   Procedure: INSERTION, STENT, DRUG-ELUTING, LACRIMAL CANALICULUS;  Surgeon: Harrie Agent, MD;  Location: [facility];  Service: Ophthalmology;  Laterality: Right;   JOINT REPLACEMENT     KIDNEY STONE EXTRACTIONS  LAST ONE 1970   X2 OPEN/  X1  URETEROSCOPY   MELANOMA EXCISION RIGHT ARM  1990   POLYPECTOMY     PROSTATE BIOPSY     RADIOACTIVE SEED IMPLANT N/A 03/21/2013   Procedure: RADIOACTIVE SEED IMPLANT;  Surgeon: Norleen JINNY Seltzer, MD;  Location: [facility];  Service: Urology;  Laterality: N/A;  87 seeds implanted     TONSILLECTOMY     TOTAL  KNEE ARTHROPLASTY Right 03/21/2016   Procedure: RIGHT TOTAL KNEE ARTHROPLASTY;  Surgeon: Donnice Car, MD;  Location: [facility];  Service: Orthopedics;  Laterality: Right;    Social History:  reports that he has never smoked. He has never used smokeless tobacco. He reports that he does not drink alcohol and does not use drugs.   Allergies[1]  Family History  Problem Relation Age of Onset   Breast cancer Mother    Stroke Mother    Hypertension Mother    Lung cancer Father        smoked   Ulcers Father    Heart disease Brother 32       stent   Esophageal cancer Brother    Cancer Paternal Uncle        prostate   Cancer Paternal Grandfather        prostate   Heart attack Neg Hx    Colon cancer Neg Hx    Colon polyps Neg Hx    Rectal cancer Neg Hx    Stomach cancer Neg Hx       Prior to Admission medications  Medication Sig Start Date End Date Taking? Authorizing Provider  albuterol  (VENTOLIN  HFA) 108 (90 Base) MCG/ACT inhaler Inhale 2 puffs into the lungs every 6 (six) hours as needed for wheezing or shortness of breath. 06/24/24   Lavell Bari LABOR, FNP  aspirin  325 MG tablet Take 1 tablet (325 mg total) by mouth daily. 05/13/20   [name], DO  atorvastatin  (LIPITOR) 10 MG tablet Take 1 tablet (10 mg total) by mouth daily. 06/05/24   [name], DO  azelastine  (ASTELIN ) 0.1 % nasal spray Place 1 spray into both nostrils 2 (two) times daily. For runny nose 06/24/24   Lavell Bari A, FNP  benzonatate  (TESSALON ) 100 MG capsule Take 1 capsule (100 mg total) by mouth 2 (two) times daily as needed for cough. Patient not taking: Reported on 09/10/2024 06/24/24   Lavell Bari A, FNP  Blood Glucose Monitoring Suppl DEVI 1 each by Does not apply route as directed. Dispense based on patient and insurance preference. Use up to four times daily as directed. (FOR ICD-10 E10.9, E11.9). 08/30/24   [name], DO  carvedilol  (COREG ) 25 MG tablet Take 1 tablet (25 mg  total) by mouth 2 (two) times daily with a meal. Hold if systolic blood pressure (top number) less than or heart rate less than 60 08/04/24   Gherghe, Nilda HERO, MD  cholecalciferol (VITAMIN D3) 25 MCG (1000 UT) tablet Take 1,000 Units by mouth daily.    [provider]  doxazosin  (CARDURA ) 1 MG tablet Take 1 mg by  mouth at bedtime. 12/02/22   [provider]  Glucose Blood (BLOOD GLUCOSE TEST STRIPS) STRP 1 each by Does not apply route as directed. Dispense based on patient and insurance preference. Use up to four times daily as directed. (FOR ICD-10 E10.9, E11.9). 08/30/24   [name], DO  hydrALAZINE  (APRESOLINE ) 50 MG tablet Take 1 tablet (50 mg total) by mouth 3 (three) times daily. 08/20/24   Floretta Mallard, MD  HYDROcodone -acetaminophen  (NORCO/VICODIN) 5-325 MG tablet Take 1 tablet by mouth every 6 (six) hours as needed for severe pain (pain score 7-10). 08/04/24   Trixie Nilda HERO, MD  Lancet Device MISC 1 each by Does not apply route as directed. Dispense based on patient and insurance preference. Use up to four times daily as directed. (FOR ICD-10 E10.9, E11.9). 08/30/24   [name], DO  Lancets MISC 1 each by Does not apply route as directed. Dispense based on patient and insurance preference. Use up to four times daily as directed. (FOR ICD-10 E10.9, E11.9). 08/30/24   [name], DO  lidocaine  (LIDODERM ) 5 % Place 1 patch onto the skin daily. Remove & Discard patch within 12 hours or as directed by MD 08/04/24   Trixie Nilda HERO, MD  loratadine  (CLARITIN ) 10 MG tablet Take 1 tablet EVERY OTHER day.  RENAL DOSING!!! Please reiterate this. 11/07/22   [name], DO  sodium zirconium cyclosilicate  (LOKELMA ) 10 g PACK packet Take 10 g by mouth daily. 03/25/24   Patsey Lot, MD  torsemide  (DEMADEX ) 20 MG tablet Take 2 tablets (40 mg total) by mouth daily. Hold if systolic blood pressure (top number) is < 120 mmHg. 08/30/24   Floretta Mallard, MD    Physical Exam: BP (!) 122/57   Pulse (!) 55   Temp 98.2 F (36.8 C)   Resp 20   Ht 5' 7 (1.702 m)   Wt 98.4 kg   SpO2 94%   BMI 33.99 kg/m   General: 84 y.o. year-old male well developed well nourished in no acute distress.  Alert and oriented x3. Cardiovascular: Regular rate and rhythm with no rubs or gallops.  No thyromegaly or JVD noted.  No lower extremity edema. 2/4 pulses in all 4 extremities. Respiratory: Clear to auscultation with no wheezes or rales. Good inspiratory effort. Abdomen: Soft nontender nondistended with normal bowel sounds x4 quadrants. Muskuloskeletal: No cyanosis, clubbing or edema noted bilaterally Neuro: CN II-XII intact, strength, sensation, reflexes Skin: No ulcerative lesions noted or rashes Psychiatry: Judgement and insight appear normal. Mood is appropriate for condition and setting          Labs on Admission:  Basic Metabolic Panel: Recent Labs  Lab 09/15/24 2000  NA 141  K 4.5  CL 109  CO2 12*  GLUCOSE 83  BUN 125*  CREATININE 4.97*  CALCIUM  8.8*   Liver Function Tests: No results for input(s): AST, ALT, ALKPHOS, BILITOT, PROT, ALBUMIN in the last 168 hours. No results for input(s): LIPASE, AMYLASE in the last 168 hours. No results for input(s): AMMONIA in the last 168 hours. CBC: Recent Labs  Lab 09/15/24 2000  WBC 9.7  HGB 7.3*  HCT 23.3*  MCV 95.9  PLT 105*   Cardiac Enzymes: No results for input(s): CKTOTAL, CKMB, CKMBINDEX, TROPONINI in the last 168 hours.  BNP (last 3 results) Recent Labs    08/16/24 1213  BNP 506.7*    ProBNP (last 3 results) Recent Labs    07/30/24 1800 09/15/24 2000  PROBNP >35,000.0* 22,909.0*    CBG: No results for input(s): GLUCAP in the last 168 hours.  Radiological Exams on Admission: DG Chest 2 View Result Date: 09/15/2024 EXAM: 2 VIEW(S) XRAY OF THE CHEST 09/15/2024 07:12:23 PM COMPARISON: 07/30/2024 and CT / 7 / 26 CLINICAL HISTORY:  Shortness of breath. FINDINGS: LUNGS AND PLEURA: Pulmonary vascular congestion. Small bilateral pleural effusions. Similar basilar airspace disease. No pneumothorax. HEART AND MEDIASTINUM: Cardiomegaly. BONES AND SOFT TISSUES: Degenerative changes of thoracic spine. IMPRESSION: 1. Similar small bilateral pleural effusions and basilar airspace opacities. 2. Cardiomegaly and pulmonary vascular congestion. Electronically signed by: Norman Gatlin MD 09/15/2024 07:18 PM EST RP Workstation: [id number]    EKG: I independently viewed the EKG done and my findings are as followed: Sinus bradycardia rate of 53.  QTc 521.  Assessment/Plan Present on Admission:  Acute on chronic systolic (congestive) heart failure (HCC)  Principal Problem:   Acute on chronic systolic (congestive) heart failure (HCC)  Acute on chronic combined diastolic and systolic CHF Last 2D echo done on 07/25/2024 revealed LVEF 30 to 35% with grade 1 diastolic dysfunction. Presented with volume overload on exam, worsening dyspnea with minimal exertion, nonproductive cough, proBNP greater than 22,000, bilateral pleural effusions, cardiomegaly and pulmonary vascular congestion seen on chest x-ray. IV diuresis initiated in the ER, continue Monitor strict I's and O's and daily weight Consider cardiology consultation in the morning.  Anemia of chronic disease Hemoglobin 7.3 from baseline of 8.5. No overt bleeding reported Transfuse 1 unit PRBCs Repeat CBC in the morning.  CKD 3 Appears to be at baseline with creatinine of 4.97 and GFR of 11. Avoid nephrotoxic agents, and hypotension. Monitor urine output Repeat BMP in the morning.  High anion gap metabolic acidosis Serum bicarb 12, anion gap 20 1 amp of bicarb Repeat BMP in the morning  Generalized fatigue PT OT evaluation Fall precautions.  Obesity BMI 33 Recommend weight loss outpatient with regular physical activity and healthy dieting.   Critical care time: 55  minutes.   DVT prophylaxis: Subcu heparin  3 times daily  Code Status: Full code.  Family Communication: None at bedside.  Disposition Plan: Admitted to telemetry unit.  Consults called: None.  Admission status: Inpatient status.   Status is: Inpatient The patient requires at least 2 midnights for further evaluation and treatment of present condition.   Terry LOISE Hurst MD [facility] Pager [phone]  If 7PM-7AM, please contact night-coverage [email or web address] Password [email or web address]  09/15/2024, 9:35 PM      [1] No Known Allergies  "

## 2024-09-15 NOTE — ED Triage Notes (Signed)
 Pt with hx of CHF, with SOB and swelling to bilateral legs. Pt with kidney disease as well. + cough.

## 2024-09-16 ENCOUNTER — Telehealth (HOSPITAL_COMMUNITY): Payer: Self-pay | Admitting: Emergency Medicine

## 2024-09-16 DIAGNOSIS — A419 Sepsis, unspecified organism: Secondary | ICD-10-CM

## 2024-09-16 DIAGNOSIS — T68XXXA Hypothermia, initial encounter: Secondary | ICD-10-CM

## 2024-09-16 DIAGNOSIS — I350 Nonrheumatic aortic (valve) stenosis: Secondary | ICD-10-CM

## 2024-09-16 LAB — GLUCOSE, CAPILLARY: Glucose-Capillary: 96 mg/dL (ref 70–99)

## 2024-09-16 LAB — COMPREHENSIVE METABOLIC PANEL WITH GFR
ALT: 25 U/L (ref 0–44)
AST: 19 U/L (ref 15–41)
Albumin: 3.2 g/dL — ABNORMAL LOW (ref 3.5–5.0)
Alkaline Phosphatase: 72 U/L (ref 38–126)
Anion gap: 21 — ABNORMAL HIGH (ref 5–15)
BUN: 121 mg/dL — ABNORMAL HIGH (ref 8–23)
CO2: 12 mmol/L — ABNORMAL LOW (ref 22–32)
Calcium: 8.7 mg/dL — ABNORMAL LOW (ref 8.9–10.3)
Chloride: 109 mmol/L (ref 98–111)
Creatinine, Ser: 4.92 mg/dL — ABNORMAL HIGH (ref 0.61–1.24)
GFR, Estimated: 11 mL/min — ABNORMAL LOW
Glucose, Bld: 74 mg/dL (ref 70–99)
Potassium: 4.3 mmol/L (ref 3.5–5.1)
Sodium: 142 mmol/L (ref 135–145)
Total Bilirubin: <0.2 mg/dL (ref 0.0–1.2)
Total Protein: 5.5 g/dL — ABNORMAL LOW (ref 6.5–8.1)

## 2024-09-16 LAB — BLOOD GAS, VENOUS
Acid-base deficit: 13.2 mmol/L — ABNORMAL HIGH (ref 0.0–2.0)
Bicarbonate: 12.9 mmol/L — ABNORMAL LOW (ref 20.0–28.0)
Drawn by: 1517
O2 Saturation: 96.4 %
Patient temperature: 36.8
pCO2, Ven: 30 mmHg — ABNORMAL LOW (ref 44–60)
pH, Ven: 7.24 — ABNORMAL LOW (ref 7.25–7.43)
pO2, Ven: 67 mmHg — ABNORMAL HIGH (ref 32–45)

## 2024-09-16 LAB — BLOOD CULTURE ID PANEL (REFLEXED) - BCID2

## 2024-09-16 LAB — EXPECTORATED SPUTUM ASSESSMENT W GRAM STAIN, RFLX TO RESP C: Special Requests: NORMAL

## 2024-09-16 LAB — CBC WITH DIFFERENTIAL/PLATELET
Abs Immature Granulocytes: 0.06 10*3/uL (ref 0.00–0.07)
Basophils Absolute: 0 10*3/uL (ref 0.0–0.1)
Basophils Relative: 0 %
Eosinophils Absolute: 0 10*3/uL (ref 0.0–0.5)
Eosinophils Relative: 0 %
HCT: 23.4 % — ABNORMAL LOW (ref 39.0–52.0)
Hemoglobin: 7.7 g/dL — ABNORMAL LOW (ref 13.0–17.0)
Immature Granulocytes: 1 %
Lymphocytes Relative: 3 %
Lymphs Abs: 0.3 10*3/uL — ABNORMAL LOW (ref 0.7–4.0)
MCH: 31.3 pg (ref 26.0–34.0)
MCHC: 32.9 g/dL (ref 30.0–36.0)
MCV: 95.1 fL (ref 80.0–100.0)
Monocytes Absolute: 0.6 10*3/uL (ref 0.1–1.0)
Monocytes Relative: 6 %
Neutro Abs: 8.4 10*3/uL — ABNORMAL HIGH (ref 1.7–7.7)
Neutrophils Relative %: 90 %
Platelets: 130 10*3/uL — ABNORMAL LOW (ref 150–400)
RBC: 2.46 MIL/uL — ABNORMAL LOW (ref 4.22–5.81)
RDW: 16.1 % — ABNORMAL HIGH (ref 11.5–15.5)
WBC: 9.4 10*3/uL (ref 4.0–10.5)
nRBC: 0 % (ref 0.0–0.2)

## 2024-09-16 LAB — PHOSPHORUS: Phosphorus: 7.6 mg/dL — ABNORMAL HIGH (ref 2.5–4.6)

## 2024-09-16 LAB — PREPARE RBC (CROSSMATCH)

## 2024-09-16 LAB — PROCALCITONIN: Procalcitonin: 0.31 ng/mL

## 2024-09-16 LAB — MAGNESIUM: Magnesium: 2.6 mg/dL — ABNORMAL HIGH (ref 1.7–2.4)

## 2024-09-16 LAB — ABO/RH: ABO/RH(D): A NEG

## 2024-09-16 LAB — LACTIC ACID, PLASMA: Lactic Acid, Venous: 0.5 mmol/L (ref 0.5–1.9)

## 2024-09-16 MED ORDER — OXYCODONE-ACETAMINOPHEN 5-325 MG PO TABS
1.0000 | ORAL_TABLET | Freq: Once | ORAL | Status: AC
  Administered 2024-09-16: 1 via ORAL
  Filled 2024-09-16: qty 1

## 2024-09-16 MED ORDER — IPRATROPIUM-ALBUTEROL 0.5-2.5 (3) MG/3ML IN SOLN
3.0000 mL | RESPIRATORY_TRACT | Status: AC | PRN
  Administered 2024-09-18: 3 mL via RESPIRATORY_TRACT
  Filled 2024-09-16: qty 3

## 2024-09-16 MED ORDER — SODIUM CHLORIDE 0.9 % IV SOLN
2.0000 g | INTRAVENOUS | Status: DC
  Administered 2024-09-16: 2 g via INTRAVENOUS
  Filled 2024-09-16: qty 20

## 2024-09-16 MED ORDER — FUROSEMIDE 10 MG/ML IJ SOLN
40.0000 mg | Freq: Two times a day (BID) | INTRAMUSCULAR | Status: DC
  Administered 2024-09-16: 40 mg via INTRAVENOUS
  Filled 2024-09-16: qty 4

## 2024-09-16 MED ORDER — ATORVASTATIN CALCIUM 10 MG PO TABS
10.0000 mg | ORAL_TABLET | Freq: Every day | ORAL | Status: AC
  Administered 2024-09-17 – 2024-09-20 (×4): 10 mg via ORAL
  Filled 2024-09-16 (×4): qty 1

## 2024-09-16 MED ORDER — CEFAZOLIN SODIUM-DEXTROSE 2-4 GM/100ML-% IV SOLN
2.0000 g | Freq: Two times a day (BID) | INTRAVENOUS | Status: DC
  Administered 2024-09-16 – 2024-09-18 (×4): 2 g via INTRAVENOUS
  Filled 2024-09-16 (×4): qty 100

## 2024-09-16 MED ORDER — FUROSEMIDE 10 MG/ML IJ SOLN
20.0000 mg/h | INTRAVENOUS | Status: DC
  Administered 2024-09-16 – 2024-09-17 (×4): 20 mg/h via INTRAVENOUS
  Filled 2024-09-16 (×8): qty 20

## 2024-09-16 MED ORDER — SODIUM BICARBONATE 650 MG PO TABS
1300.0000 mg | ORAL_TABLET | Freq: Two times a day (BID) | ORAL | Status: DC
  Administered 2024-09-16: 1300 mg via ORAL
  Filled 2024-09-16 (×2): qty 2

## 2024-09-16 MED ORDER — SODIUM CHLORIDE 0.9% IV SOLUTION: Freq: Once | INTRAVENOUS | Status: AC

## 2024-09-16 MED ORDER — SODIUM BICARBONATE 8.4 % IV SOLN
50.0000 meq | Freq: Once | INTRAVENOUS | Status: AC
  Administered 2024-09-16: 50 meq via INTRAVENOUS
  Filled 2024-09-16: qty 50

## 2024-09-16 MED ORDER — SODIUM CHLORIDE 0.9 % IV SOLN
100.0000 mg | Freq: Two times a day (BID) | INTRAVENOUS | Status: DC
  Administered 2024-09-16 – 2024-09-18 (×5): 100 mg via INTRAVENOUS
  Filled 2024-09-16 (×10): qty 100

## 2024-09-16 MED ORDER — DOXAZOSIN MESYLATE 2 MG PO TABS
1.0000 mg | ORAL_TABLET | Freq: Every day | ORAL | Status: AC
  Administered 2024-09-16 – 2024-09-20 (×3): 1 mg via ORAL
  Filled 2024-09-16 (×6): qty 1

## 2024-09-16 MED ORDER — FUROSEMIDE 10 MG/ML IJ SOLN
20.0000 mg | Freq: Two times a day (BID) | INTRAMUSCULAR | Status: AC
  Administered 2024-09-16 (×2): 20 mg via INTRAVENOUS
  Filled 2024-09-16 (×2): qty 2

## 2024-09-16 MED ORDER — GUAIFENESIN-DM 100-10 MG/5ML PO SYRP
5.0000 mL | ORAL_SOLUTION | ORAL | Status: AC | PRN
  Administered 2024-09-18 – 2024-09-20 (×4): 5 mL via ORAL
  Filled 2024-09-16 (×4): qty 5
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
## 2024-09-16 NOTE — TOC Progression Note (Signed)
 Transition of Care (TOC) - Progression Note    Patient Details  Name: Ricardo Mayer. Date of Birth: [birth year]  Transition of Care [facility]) CM/SW Contact  Sharlyne Stabs, RN Phone Number: 09/16/2024, 4:45 PM  Clinical Narrative:   Florence Parkin, Wife to discuss bed offers. [facility] selected, IPCM follow to start Auth when medically stable.     Expected Discharge Plan: Skilled Nursing Facility Barriers to Discharge: Continued Medical Work up    Expected Discharge Plan and Services In-house Referral: Clinical Social Work Discharge Planning Services: CM Consult Post Acute Care Choice: Home Health, Durable Medical Equipment Living arrangements for the past 2 months: Single Family Home       Social Drivers of Health (SDOH) Interventions SDOH Screenings   Food Insecurity: No Food Insecurity (09/10/2024)  Housing: Low Risk (09/10/2024)  Transportation Needs: No Transportation Needs (09/10/2024)  Utilities: Not At Risk (09/10/2024)  Alcohol Screen: Low Risk (08/20/2023)  Depression (PHQ2-9): Medium Risk (09/10/2024)  Financial Resource Strain: Low Risk (08/20/2023)  Physical Activity: Inactive (09/10/2024)  Social Connections: Socially Integrated (09/10/2024)  Stress: No Stress Concern Present (09/10/2024)  Tobacco Use: Low Risk (09/15/2024)  Health Literacy: Adequate Health Literacy (09/10/2024)    Readmission Risk Interventions    09/16/2024    2:27 PM 08/01/2024   11:37 AM  Readmission Risk Prevention Plan  Transportation Screening Complete Complete  PCP or Specialist Appt within 5-7 Days  Complete  Home Care Screening  Complete  Medication Review (RN CM)  Complete  HRI or Home Care Consult Complete   Social Work Consult for Recovery Care Planning/Counseling Complete   Palliative Care Screening Not Applicable   Medication Review Oceanographer) Complete

## 2024-09-16 NOTE — Progress Notes (Signed)
 Pt using primafit. Voiding. On lasix  drip. Heels elevated off bed. Sputum cup provided and pt educated on use.

## 2024-09-16 NOTE — ED Notes (Signed)
 Unable to get morning labs due to patient currently receiving blood. Dr. Shona made aware.

## 2024-09-16 NOTE — Plan of Care (Signed)

## 2024-09-16 NOTE — ED Notes (Signed)
 Bladder scan is , pt pushed out 234ml+ after bladder reading. Bladder reading is at this time. Updatd rectal temp is 98.0. Bear hugger still not needed at this time.

## 2024-09-16 NOTE — ED Notes (Signed)
 Patient was cold to the touch after having multiple blankets. Unable to get a oral temp on patient. Rectal temp read 90.6 patient was placed on a [employer] Provider notified.

## 2024-09-16 NOTE — TOC Initial Note (Signed)
 Transition of Care (TOC) - Initial/Assessment Note    Patient Details  Name: Ricardo Mayer. MRN: [id number] Date of Birth: 1941/04/08  Transition of Care [facility]) CM/SW Contact:    Noreen KATHEE Cleotilde ISRAEL Phone Number: 09/16/2024, 2:44 PM  Clinical Narrative:                  CSW spoke with patient spouse to assess patient and discussed PT recommendation for SNF. Spouse reports that patient was independent at home with active HHPT/HHRN through [facility]. Spouse reports that patient has a walker and cane, but uses the walker primarily that is at the hospital. She also reports that patient was driving until his fall from driving to get the mail and his foot got stuck.  CSW discussed PT recommendation for SNF. Spouse agreeable and after going over facilities and there locations, she chose to have patient referral sent to [facility] and [facility]. Referral process started. ICM will continue to follow.     Expected Discharge Plan: Skilled Nursing Facility Barriers to Discharge: Continued Medical Work up   Patient Goals and CMS Choice Patient states their goals for this hospitalization and ongoing recovery are:: get better CMS Medicare.gov Compare Post Acute Care list provided to:: Patient Represenative (must comment) (Spouse - Janelle) Choice offered to / list presented to : Spouse [place] ownership interest in [facility].provided to:: Spouse    Expected Discharge Plan and Services In-house Referral: Clinical Social Work Discharge Planning Services: CM Consult Post Acute Care Choice: Home Health, Durable Medical Equipment Living arrangements for the past 2 months: Single Family Home                                      Prior Living Arrangements/Services Living arrangements for the past 2 months: Single Family Home Lives with:: Spouse Patient language and need for interpreter reviewed:: Yes Do you feel safe going back to the place where you live?: Yes      Need for Family  Participation in Patient Care: Yes (Comment) Care giver support system in place?: No (comment) Current home services: DME Criminal Activity/Legal Involvement Pertinent to Current Situation/Hospitalization: No - Comment as needed  Activities of Daily Living      Permission Sought/Granted      Share Information with NAME: Janelle     Permission granted to share info w Relationship: Spouse     Emotional Assessment       Orientation: : Oriented to Self Alcohol / Substance Use: Not Applicable Psych Involvement: No (comment)  Admission diagnosis:  Acute on chronic systolic (congestive) heart failure (HCC) [I50.23] Patient Active Problem List   Diagnosis Date Noted   Nonrheumatic aortic valve stenosis 09/16/2024   Sepsis (HCC) 09/16/2024   Hypothermia 09/16/2024   Acute on chronic combined systolic and diastolic CHF (congestive heart failure) (HCC) 09/15/2024   Aortic valve disease 08/03/2024   AKI (acute kidney injury) 08/03/2024   Mixed hyperlipidemia 08/03/2024   Acute CHF (congestive heart failure) (HCC) 07/31/2024   Acute HFrEF (heart failure with reduced ejection fraction) (HCC) 07/31/2024   Elevated troponin 07/31/2024   Obesity (BMI 30.0-34.9) 06/05/2024   CKD (chronic kidney disease) stage 5, GFR less than 15 ml/min (HCC) 05/28/2024   Macular degeneration of both eyes 05/17/2023   Wears hearing aid in both ears 05/17/2023   History of pulmonary embolus (PE) 01/10/2020   History of deep vein thrombosis (DVT)  of lower extremity 01/10/2020   History of prostate cancer 01/10/2020   Recurrent deep vein thrombosis (DVT) (HCC) 07/10/2017   Hyperlipidemia associated with type 2 diabetes mellitus (HCC) 07/13/2016   Essential hypertension 07/13/2016   Vitamin D  deficiency 07/13/2016   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   S/P knee replacement 03/21/2016   Anemia associated with chronic renal failure 10/28/2015   Hyperparathyroidism , secondary, non-renal 10/28/2015   Type 2 diabetes  mellitus with vascular disease (HCC) 06/02/2015   Peripheral vascular insufficiency (HCC) 10/09/2013   PCP:  [name], DO Pharmacy:   [facility] [address], [place] - [address] [place] [address] Phone: [phone] Fax: [phone]  [facility]) - [place], [place] - [address] [facility] [address] [place] [address] Phone: [phone] Fax: [phone]  [facility] Delivery - [place], [place] - [address] [place] [address] Phone: [phone] Fax: [phone]     Social Drivers of Health (SDOH) Social History: SDOH Screenings   Food Insecurity: No Food Insecurity (09/10/2024)  Housing: Low Risk (09/10/2024)  Transportation Needs: No Transportation Needs (09/10/2024)  Utilities: Not At Risk (09/10/2024)  Alcohol Screen: Low Risk (08/20/2023)  Depression (PHQ2-9): Medium Risk (09/10/2024)  Financial Resource Strain: Low Risk (08/20/2023)  Physical Activity: Inactive (09/10/2024)  Social Connections: Socially Integrated (09/10/2024)  Stress: No Stress Concern Present (09/10/2024)  Tobacco Use: Low Risk (09/15/2024)  Health Literacy: Adequate Health Literacy (09/10/2024)   SDOH Interventions:     Readmission Risk Interventions    09/16/2024    2:27 PM 08/01/2024   11:37 AM  Readmission Risk Prevention Plan  Transportation Screening Complete Complete  PCP or Specialist Appt within 5-7 Days  Complete  Home Care Screening  Complete  Medication Review (RN CM)  Complete  HRI or Home Care Consult Complete   Social Work Consult for Recovery Care Planning/Counseling Complete   Palliative Care Screening Not Applicable   Medication Review Oceanographer) Complete

## 2024-09-16 NOTE — [place] FL2 (Signed)
 " [place]  MEDICAID FL2 LEVEL OF CARE FORM     IDENTIFICATION  Patient Name: Ricardo Mayer. Birthdate: 06/20/1941 Sex: male Admission Date (Current Location): 09/15/2024  [place] and [place] Number:  [employer] and Address:  [facility],  [address] [place] [address]      Provider Number: [id number]  Attending Physician Name and Address:  Mosie Ford, MD  Relative Name and Phone Number:  Bion Todorov [phone]    Current Level of Care: Hospital Recommended Level of Care: Skilled Nursing Facility Prior Approval Number:    Date Approved/Denied:   PASRR Number: [id number]  Discharge Plan: SNF    Current Diagnoses: Patient Active Problem List   Diagnosis Date Noted   Nonrheumatic aortic valve stenosis 09/16/2024   Sepsis (HCC) 09/16/2024   Hypothermia 09/16/2024   Acute on chronic combined systolic and diastolic CHF (congestive heart failure) (HCC) 09/15/2024   Aortic valve disease 08/03/2024   AKI (acute kidney injury) 08/03/2024   Mixed hyperlipidemia 08/03/2024   Acute CHF (congestive heart failure) (HCC) 07/31/2024   Acute HFrEF (heart failure with reduced ejection fraction) (HCC) 07/31/2024   Elevated troponin 07/31/2024   Obesity (BMI 30.0-34.9) 06/05/2024   CKD (chronic kidney disease) stage 5, GFR less than 15 ml/min (HCC) 05/28/2024   Macular degeneration of both eyes 05/17/2023   Wears hearing aid in both ears 05/17/2023   History of pulmonary embolus (PE) 01/10/2020   History of deep vein thrombosis (DVT) of lower extremity 01/10/2020   History of prostate cancer 01/10/2020   Recurrent deep vein thrombosis (DVT) (HCC) 07/10/2017   Hyperlipidemia associated with type 2 diabetes mellitus (HCC) 07/13/2016   Essential hypertension 07/13/2016   Vitamin D  deficiency 07/13/2016   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   S/P knee replacement 03/21/2016   Anemia associated with chronic renal failure 10/28/2015   Hyperparathyroidism  , secondary, non-renal 10/28/2015   Type 2 diabetes mellitus with vascular disease (HCC) 06/02/2015   Peripheral vascular insufficiency (HCC) 10/09/2013    Orientation RESPIRATION BLADDER Height & Weight     Self, Time, Situation, Place  Normal Continent Weight: 217 lb (98.4 kg) Height:  5' 7 (170.2 cm)  BEHAVIORAL SYMPTOMS/MOOD NEUROLOGICAL BOWEL NUTRITION STATUS      Continent Diet (Heart)  AMBULATORY STATUS COMMUNICATION OF NEEDS Skin   Extensive Assist Verbally Normal                       Personal Care Assistance Level of Assistance  Bathing, Feeding, Dressing Bathing Assistance: Maximum assistance Feeding assistance: Independent Dressing Assistance: Maximum assistance     Functional Limitations Info  Sight, Hearing, Speech Sight Info: Adequate Hearing Info: Adequate Speech Info: Adequate    SPECIAL CARE FACTORS FREQUENCY  PT (By licensed PT), OT (By licensed OT)     PT Frequency: 5 x a week OT Frequency: 5 x a week            Contractures Contractures Info: Not present    Additional Factors Info  Code Status, Allergies Code Status Info: DNR-Limited Allergies Info: NKA           Current Medications (09/16/2024):  This is the current hospital active medication list Current Facility-Administered Medications  Medication Dose Route Frequency Provider Last Rate Last Admin   0.9 %  sodium chloride  infusion (Manually program via Guardrails IV Fluids)   Intravenous Once Shona Terry SAILOR, DO   Held at 09/16/24 0115   acetaminophen  (TYLENOL ) tablet  650 mg  650 mg Oral Q6H PRN Hall, Carole N, DO   650 mg at 09/16/24 0423   cefTRIAXone  (ROCEPHIN ) 2 g in sodium chloride  0.9 % 100 mL IVPB  2 g Intravenous Q24H Hall, Carole N, DO   Stopped at 09/16/24 9940   doxazosin  (CARDURA ) tablet 1 mg  1 mg Oral QHS Al-Sultani, Anmar, MD       doxycycline  (VIBRAMYCIN ) 100 mg in sodium chloride  0.9 % 250 mL IVPB  100 mg Intravenous Q12H Shona Laurence N, DO 125 mL/hr at 09/16/24  1351 100 mg at 09/16/24 1351   furosemide  (LASIX ) 200 mg in dextrose  5 % 100 mL (2 mg/mL) infusion  20 mg/hr Intravenous Continuous Mallipeddi, Vishnu P, MD       guaiFENesin -dextromethorphan  (ROBITUSSIN DM) 100-10 MG/5ML syrup 5 mL  5 mL Oral Q4H PRN Hall, Carole N, DO       heparin  injection 5,000 Units  5,000 Units Subcutaneous Q8H Shona Laurence N, DO   5,000 Units at 09/16/24 1354   ipratropium-albuterol  (DUONEB) 0.5-2.5 (3) MG/3ML nebulizer solution 3 mL  3 mL Nebulization Q2H PRN Hall, Carole N, DO       melatonin tablet 6 mg  6 mg Oral QHS PRN Shona Laurence N, DO       polyethylene glycol (MIRALAX  / GLYCOLAX ) packet 17 g  17 g Oral Daily PRN Shona Laurence N, DO       prochlorperazine  (COMPAZINE ) injection 5 mg  5 mg Intravenous Q6H PRN Shona Laurence SAILOR, DO       Current Outpatient Medications  Medication Sig Dispense Refill   acetaminophen  (TYLENOL ) 500 MG tablet Take 1,000 mg by mouth every 6 (six) hours as needed for moderate pain (pain score 4-6).     albuterol  (VENTOLIN  HFA) 108 (90 Base) MCG/ACT inhaler Inhale 2 puffs into the lungs every 6 (six) hours as needed for wheezing or shortness of breath. 8 g 0   aspirin  325 MG tablet Take 1 tablet (325 mg total) by mouth daily. 90 tablet 3   atorvastatin  (LIPITOR) 10 MG tablet Take 1 tablet (10 mg total) by mouth daily. 100 tablet 3   azelastine  (ASTELIN ) 0.1 % nasal spray Place 1 spray into both nostrils 2 (two) times daily. For runny nose 30 mL 12   carvedilol  (COREG ) 25 MG tablet Take 1 tablet (25 mg total) by mouth 2 (two) times daily with a meal. Hold if systolic blood pressure (top number) less than or heart rate less than 60 60 tablet 0   cholecalciferol (VITAMIN D3) 25 MCG (1000 UT) tablet Take 1,000 Units by mouth daily.     Cyanocobalamin (VITAMIN B-12) 2500 MCG SUBL Place 1 tablet under the tongue daily.     doxazosin  (CARDURA ) 1 MG tablet Take 1 mg by mouth at bedtime.     hydrALAZINE  (APRESOLINE ) 50 MG tablet Take 1 tablet  (50 mg total) by mouth 3 (three) times daily. (Patient taking differently: Take 50 mg by mouth 2 (two) times daily.) 270 tablet 3   lidocaine  (LIDODERM ) 5 % Place 1 patch onto the skin daily. Remove & Discard patch within 12 hours or as directed by MD 30 patch 0   sodium zirconium cyclosilicate  (LOKELMA ) 10 g PACK packet Take 10 g by mouth daily. 1 packet 0   torsemide  (DEMADEX ) 20 MG tablet Take 2 tablets (40 mg total) by mouth daily. Hold if systolic blood pressure (top number) is < 120 mmHg. 60 tablet 0   Blood  Glucose Monitoring Suppl DEVI 1 each by Does not apply route as directed. Dispense based on patient and insurance preference. Use up to four times daily as directed. (FOR ICD-10 E10.9, E11.9). 1 each 0   Glucose Blood (BLOOD GLUCOSE TEST STRIPS) STRP 1 each by Does not apply route as directed. Dispense based on patient and insurance preference. Use up to four times daily as directed. (FOR ICD-10 E10.9, E11.9). 100 strip 0   Lancet Device MISC 1 each by Does not apply route as directed. Dispense based on patient and insurance preference. Use up to four times daily as directed. (FOR ICD-10 E10.9, E11.9). 1 each 0   Lancets MISC 1 each by Does not apply route as directed. Dispense based on patient and insurance preference. Use up to four times daily as directed. (FOR ICD-10 E10.9, E11.9). 100 each 0     Discharge Medications: Please see discharge summary for a list of discharge medications.  Relevant Imaging Results:  Relevant Lab Results:   Additional Information SS# [id number]  Noreen KATHEE Pinal, LCSWA     "

## 2024-09-16 NOTE — Progress Notes (Signed)
 Sputum obtained and sent. Lab called and contaminated. Need recolleciton

## 2024-09-17 ENCOUNTER — Encounter (HOSPITAL_COMMUNITY)

## 2024-09-17 ENCOUNTER — Inpatient Hospital Stay (HOSPITAL_COMMUNITY)

## 2024-09-17 ENCOUNTER — Other Ambulatory Visit (HOSPITAL_COMMUNITY): Payer: Self-pay | Admitting: *Deleted

## 2024-09-17 DIAGNOSIS — R7881 Bacteremia: Secondary | ICD-10-CM

## 2024-09-17 DIAGNOSIS — R6521 Severe sepsis with septic shock: Secondary | ICD-10-CM | POA: Diagnosis not present

## 2024-09-17 DIAGNOSIS — N186 End stage renal disease: Secondary | ICD-10-CM | POA: Diagnosis not present

## 2024-09-17 DIAGNOSIS — I350 Nonrheumatic aortic (valve) stenosis: Secondary | ICD-10-CM

## 2024-09-17 DIAGNOSIS — E8770 Fluid overload, unspecified: Secondary | ICD-10-CM | POA: Diagnosis not present

## 2024-09-17 DIAGNOSIS — A4101 Sepsis due to Methicillin susceptible Staphylococcus aureus: Secondary | ICD-10-CM

## 2024-09-17 DIAGNOSIS — I4891 Unspecified atrial fibrillation: Secondary | ICD-10-CM | POA: Diagnosis not present

## 2024-09-17 DIAGNOSIS — I5043 Acute on chronic combined systolic (congestive) and diastolic (congestive) heart failure: Secondary | ICD-10-CM

## 2024-09-17 DIAGNOSIS — N185 Chronic kidney disease, stage 5: Secondary | ICD-10-CM

## 2024-09-17 DIAGNOSIS — B9561 Methicillin susceptible Staphylococcus aureus infection as the cause of diseases classified elsewhere: Secondary | ICD-10-CM | POA: Diagnosis not present

## 2024-09-17 DIAGNOSIS — I429 Cardiomyopathy, unspecified: Secondary | ICD-10-CM | POA: Diagnosis not present

## 2024-09-17 DIAGNOSIS — R680 Hypothermia, not associated with low environmental temperature: Secondary | ICD-10-CM | POA: Diagnosis not present

## 2024-09-17 DIAGNOSIS — I509 Heart failure, unspecified: Secondary | ICD-10-CM

## 2024-09-17 DIAGNOSIS — Z992 Dependence on renal dialysis: Secondary | ICD-10-CM | POA: Diagnosis not present

## 2024-09-17 LAB — BPAM RBC
Blood Product Expiration Date: 202602072359
ISSUE DATE / TIME: 202602020259
Unit Type and Rh: 9500

## 2024-09-17 LAB — ECHOCARDIOGRAM COMPLETE
AR max vel: 1.24 cm2
AV Area VTI: 1.26 cm2
AV Area mean vel: 1.37 cm2
AV Mean grad: 12.7 mmHg
AV Peak grad: 23.9 mmHg
Ao pk vel: 2.44 m/s
Area-P 1/2: 4.7 cm2
Height: 67 in
MV VTI: 2.3 cm2
P 1/2 time: 433 ms
S' Lateral: 4.7 cm
Single Plane A4C EF: 37.2 %
Weight: 3499.14 [oz_av]

## 2024-09-17 LAB — BASIC METABOLIC PANEL WITH GFR
Anion gap: 18 — ABNORMAL HIGH (ref 5–15)
BUN: 129 mg/dL — ABNORMAL HIGH (ref 8–23)
CO2: 13 mmol/L — ABNORMAL LOW (ref 22–32)
Calcium: 8.4 mg/dL — ABNORMAL LOW (ref 8.9–10.3)
Chloride: 107 mmol/L (ref 98–111)
Creatinine, Ser: 5.46 mg/dL — ABNORMAL HIGH (ref 0.61–1.24)
GFR, Estimated: 10 mL/min — ABNORMAL LOW
Glucose, Bld: 79 mg/dL (ref 70–99)
Potassium: 4.3 mmol/L (ref 3.5–5.1)
Sodium: 138 mmol/L (ref 135–145)

## 2024-09-17 LAB — MAGNESIUM: Magnesium: 2.4 mg/dL (ref 1.7–2.4)

## 2024-09-17 LAB — CBC WITH DIFFERENTIAL/PLATELET
Abs Immature Granulocytes: 0.07 10*3/uL (ref 0.00–0.07)
Basophils Absolute: 0 10*3/uL (ref 0.0–0.1)
Basophils Relative: 0 %
Eosinophils Absolute: 0 10*3/uL (ref 0.0–0.5)
Eosinophils Relative: 0 %
HCT: 22.4 % — ABNORMAL LOW (ref 39.0–52.0)
Hemoglobin: 7.3 g/dL — ABNORMAL LOW (ref 13.0–17.0)
Immature Granulocytes: 1 %
Lymphocytes Relative: 7 %
Lymphs Abs: 0.8 10*3/uL (ref 0.7–4.0)
MCH: 30.8 pg (ref 26.0–34.0)
MCHC: 32.6 g/dL (ref 30.0–36.0)
MCV: 94.5 fL (ref 80.0–100.0)
Monocytes Absolute: 0.9 10*3/uL (ref 0.1–1.0)
Monocytes Relative: 8 %
Neutro Abs: 8.8 10*3/uL — ABNORMAL HIGH (ref 1.7–7.7)
Neutrophils Relative %: 84 %
Platelets: 106 10*3/uL — ABNORMAL LOW (ref 150–400)
RBC: 2.37 MIL/uL — ABNORMAL LOW (ref 4.22–5.81)
RDW: 16.3 % — ABNORMAL HIGH (ref 11.5–15.5)
WBC: 10.5 10*3/uL (ref 4.0–10.5)
nRBC: 0 % (ref 0.0–0.2)

## 2024-09-17 LAB — TYPE AND SCREEN
ABO/RH(D): A NEG
Antibody Screen: NEGATIVE
Unit division: 0

## 2024-09-17 LAB — MRSA NEXT GEN BY PCR, NASAL: MRSA by PCR Next Gen: DETECTED — AB

## 2024-09-17 LAB — HEPATITIS B SURFACE ANTIGEN: Hepatitis B Surface Ag: NONREACTIVE

## 2024-09-17 LAB — PHOSPHORUS: Phosphorus: 8 mg/dL — ABNORMAL HIGH (ref 2.5–4.6)

## 2024-09-17 MED ORDER — HEPARIN SODIUM (PORCINE) 1000 UNIT/ML IJ SOLN
INTRAMUSCULAR | Status: AC
  Filled 2024-09-17: qty 4

## 2024-09-17 MED ORDER — HEPARIN SODIUM (PORCINE) 1000 UNIT/ML IJ SOLN
INTRAMUSCULAR | Status: AC
  Administered 2024-09-17: 4000 [IU] via INTRAVENOUS
  Filled 2024-09-17: qty 4

## 2024-09-17 MED ORDER — CHLORHEXIDINE GLUCONATE CLOTH 2 % EX PADS: 6.0000 | MEDICATED_PAD | Freq: Every day | CUTANEOUS | Status: DC

## 2024-09-17 MED ORDER — CHLORHEXIDINE GLUCONATE CLOTH 2 % EX PADS
6.0000 | MEDICATED_PAD | Freq: Every day | CUTANEOUS | Status: AC
  Administered 2024-09-17 – 2024-09-19 (×3): 6 via TOPICAL

## 2024-09-17 MED ORDER — LIDOCAINE HCL (PF) 1 % IJ SOLN
30.0000 mL | Freq: Once | INTRAMUSCULAR | Status: AC
  Filled 2024-09-17: qty 30

## 2024-09-17 MED ORDER — LIDOCAINE HCL (PF) 1 % IJ SOLN
INTRAMUSCULAR | Status: AC
  Administered 2024-09-17: 30 mL via INTRADERMAL
  Filled 2024-09-17: qty 10

## 2024-09-17 MED ORDER — HEPARIN SODIUM (PORCINE) 1000 UNIT/ML IJ SOLN: 4000.0000 [IU] | Freq: Once | INTRAMUSCULAR | Status: AC

## 2024-09-17 NOTE — Progress Notes (Signed)
 Pt transported to unit for tx. Used pt's R internal jugular for 2hr tx. Pt completed tx with no issues. Pt has no s/s of distress.  09/17/24 1823  Vitals  Temp 97.6 F (36.4 C)  Pulse Rate 81  Resp (!) 22  BP (!) 116/54  SpO2 100 %  O2 Device Room Air  Weight 98.7 kg  Oxygen Therapy  Patient Activity (if Appropriate) In bed  Pulse Oximetry Type Continuous  Oximetry Probe Site Changed No  Post Treatment  Dialyzer Clearance Clear  Hemodialysis Intake (mL) 0 mL  Liters Processed 24  Fluid Removed (mL) 500 mL  Tolerated HD Treatment Yes

## 2024-09-17 NOTE — Consult Note (Signed)
 "                              Consultation Note Date: 09/17/2024   Patient Name: Ricardo Mayer.  DOB: 1941/02/13  MRN: [id number]  Age / Sex: 84 y.o., male  PCP: [name], DO Referring Physician: Bryn Bernardino NOVAK, MD  Reason for Consultation: Establishing goals of care   HPI/Patient Profile: 84 y.o. male  with past medical history of *** admitted on 09/15/2024 with ***.   Discussed the use of AI scribe software for clinical note transcription with the patient, who gave verbal consent to proceed.  History of Present Illness      PMT has been consulted to assist with goals of care conversation.  Clinical Assessment and Goals of Care:  I have reviewed medical records including EPIC notes, labs and imaging (independently reviewed), ***, assessed the patient and then *** with *** to discuss diagnosis prognosis, GOC, EOL wishes, disposition and options. Collaborated directly with ***.   I introduced Palliative Medicine as specialized medical care for people living with serious illness. It focuses on providing relief from the symptoms and stress of a serious illness. The goal is to improve quality of life for both the patient and the family.  We discussed a brief life review of the patient and then focused on their current illness. ***  I attempted to elicit values and goals of care important to the patient.    Medical History Review and Family/Patient Understanding:   ***  Social History:  ***  Functional and Nutritional State:  ***  Palliative Symptoms:  ***  Advance Directives/Goals of Care/Anticipatory care planning Discussion:  A detailed discussion regarding GOC, advanced directives, and anticipatory care planning was had. ***    The difference between aggressive medical intervention and comfort care was considered in light of the patient's goals of care. Hospice and Palliative Care services outpatient were explained and offered.   Discussed the importance  of continued conversation with family and the medical providers regarding overall plan of care and treatment options, ensuring decisions are within the context of the patients values and GOCs.   Questions and concerns were addressed.  Hard Choices booklet left for review. The family was encouraged to call with questions or concerns.  PMT will continue to support holistically.  Primary Decision maker and health care surrogate:  {Primary Decision Fjxzm:78612}  Code Status:  ***    SUMMARY OF RECOMMENDATIONS    Assessment and Plan Assessment & Plan       Code Status/Advance Care Planning: {Palliative Code status:23503}   Symptom Management:  ***  Palliative Prophylaxis:  {Palliative Prophylaxis:21015}  Additional Recommendations (Limitations, Scope, Preferences): {Recommended Scope and Preferences:21019}  Psycho-social/Spiritual:  Desire for further Chaplaincy support:{YES NO:22349} Additional Recommendations: {PAL SOCIAL:21064}  Prognosis:  {Palliative Care Prognosis:23504}  Discharge Planning: {Palliative dispostion:23505}      Primary Diagnoses: Present on Admission:  Acute on chronic combined systolic and diastolic CHF (congestive heart failure) (HCC)  MSSA bacteremia    Physical Exam  Vital Signs: BP (!) 116/57 (BP Location: Left Arm)   Pulse 78   Temp 97.8 F (36.6 C) (Oral)   Resp 20   Ht 5' 7 (1.702 m)   Wt 99.2 kg   SpO2 97%   BMI 34.25 kg/m  Pain Scale: 0-10   Pain Score: 0-No pain   SpO2: SpO2: 97 % O2 Device:SpO2: 97 % O2 Flow Rate: .  Palliative Assessment/Data:    Total time:  I personally spent a total of *** minutes in the care of the patient today including {Time Based Coding:210964241}.   Billing based on MDM: ***  {Problems Addressed:304933}  {Amount and/or Complexity of Ijuj:695065}  {Risks:304936}   Laymon CHRISTELLA Pinal, NP  Palliative Medicine Team Team phone # [phone]  Thank you for allowing the  Palliative Medicine Team to assist in the care of this patient. Please utilize secure chat with additional questions, if there is no response within 30 minutes please call the above phone number.  Palliative Medicine Team providers are available by phone from 7am to 7pm daily and can be reached through the team cell phone.  Should this patient require assistance outside of these hours, please call the patient's attending physician.    "

## 2024-09-17 NOTE — Plan of Care (Signed)

## 2024-09-17 NOTE — Plan of Care (Signed)
   Problem: Health Behavior/Discharge Planning: Goal: Ability to manage health-related needs will improve Outcome: Progressing

## 2024-09-17 NOTE — Progress Notes (Signed)
 OT Cancellation Note  Patient Details Name: Ricardo Mayer. MRN: [id number] DOB: 11-21-1940   Cancelled Treatment:    Reason Eval/Treat Not Completed: Medical issues which prohibited therapy. Pt was moved to higher level of care and will need a new OT order to resume occupational therapy services. Thank you.   Jayson Person 09/17/2024, 1:38 PM

## 2024-09-17 NOTE — Progress Notes (Signed)
 Patient transferring to ICU, report given to [facility]. Family at bedside at time of transfer.  Asia Favata, Cena Helling, RN

## 2024-09-17 NOTE — Progress Notes (Signed)
 [facility]  Tolerated procedure. CXR without obvious ptx but there is a wedge like look in the right lower lobe almost like effusion around the lobe, line in good position.  Manuelita Pander, MD [facility] [address] [place], [place] [address] [phone] (office)

## 2024-09-17 NOTE — Progress Notes (Signed)
*  PRELIMINARY RESULTS* Echocardiogram 2D Echocardiogram has been performed.  Ricardo Mayer 09/17/2024, 12:16 PM

## 2024-09-18 ENCOUNTER — Ambulatory Visit: Admitting: Cardiovascular Disease

## 2024-09-18 DIAGNOSIS — N185 Chronic kidney disease, stage 5: Secondary | ICD-10-CM | POA: Diagnosis not present

## 2024-09-18 DIAGNOSIS — I429 Cardiomyopathy, unspecified: Secondary | ICD-10-CM | POA: Diagnosis not present

## 2024-09-18 DIAGNOSIS — R7881 Bacteremia: Secondary | ICD-10-CM | POA: Diagnosis not present

## 2024-09-18 DIAGNOSIS — I4891 Unspecified atrial fibrillation: Secondary | ICD-10-CM

## 2024-09-18 DIAGNOSIS — B9561 Methicillin susceptible Staphylococcus aureus infection as the cause of diseases classified elsewhere: Secondary | ICD-10-CM

## 2024-09-18 DIAGNOSIS — I5043 Acute on chronic combined systolic (congestive) and diastolic (congestive) heart failure: Secondary | ICD-10-CM | POA: Diagnosis not present

## 2024-09-18 DIAGNOSIS — R680 Hypothermia, not associated with low environmental temperature: Secondary | ICD-10-CM | POA: Diagnosis not present

## 2024-09-18 DIAGNOSIS — I350 Nonrheumatic aortic (valve) stenosis: Secondary | ICD-10-CM | POA: Diagnosis not present

## 2024-09-18 LAB — CBC
HCT: 22.3 % — ABNORMAL LOW (ref 39.0–52.0)
Hemoglobin: 7.5 g/dL — ABNORMAL LOW (ref 13.0–17.0)
MCH: 31 pg (ref 26.0–34.0)
MCHC: 33.6 g/dL (ref 30.0–36.0)
MCV: 92.1 fL (ref 80.0–100.0)
Platelets: 103 10*3/uL — ABNORMAL LOW (ref 150–400)
RBC: 2.42 MIL/uL — ABNORMAL LOW (ref 4.22–5.81)
RDW: 16.1 % — ABNORMAL HIGH (ref 11.5–15.5)
WBC: 9.2 10*3/uL (ref 4.0–10.5)
nRBC: 0 % (ref 0.0–0.2)

## 2024-09-18 LAB — RENAL FUNCTION PANEL
Albumin: 2.9 g/dL — ABNORMAL LOW (ref 3.5–5.0)
Anion gap: 18 — ABNORMAL HIGH (ref 5–15)
BUN: 98 mg/dL — ABNORMAL HIGH (ref 8–23)
CO2: 17 mmol/L — ABNORMAL LOW (ref 22–32)
Calcium: 8.5 mg/dL — ABNORMAL LOW (ref 8.9–10.3)
Chloride: 103 mmol/L (ref 98–111)
Creatinine, Ser: 4.66 mg/dL — ABNORMAL HIGH (ref 0.61–1.24)
GFR, Estimated: 12 mL/min — ABNORMAL LOW
Glucose, Bld: 100 mg/dL — ABNORMAL HIGH (ref 70–99)
Phosphorus: 6.1 mg/dL — ABNORMAL HIGH (ref 2.5–4.6)
Potassium: 3.9 mmol/L (ref 3.5–5.1)
Sodium: 138 mmol/L (ref 135–145)

## 2024-09-18 LAB — CULTURE, BLOOD (ROUTINE X 2): Special Requests: ADEQUATE

## 2024-09-18 LAB — IRON AND TIBC
Iron: 43 ug/dL — ABNORMAL LOW (ref 45–182)
Saturation Ratios: 26 % (ref 17.9–39.5)
TIBC: 169 ug/dL — ABNORMAL LOW (ref 250–450)
UIBC: 126 ug/dL

## 2024-09-18 LAB — FERRITIN: Ferritin: 649 ng/mL — ABNORMAL HIGH (ref 24–336)

## 2024-09-18 LAB — HEPATITIS B SURFACE ANTIBODY, QUANTITATIVE: Hep B S AB Quant (Post): <3.5 m[IU]/mL — ABNORMAL LOW

## 2024-09-18 MED ORDER — PENTAFLUOROPROP-TETRAFLUOROETH EX AERO: 1.0000 | INHALATION_SPRAY | CUTANEOUS | Status: DC | PRN

## 2024-09-18 MED ORDER — DARBEPOETIN ALFA 100 MCG/0.5ML IJ SOSY
100.0000 ug | PREFILLED_SYRINGE | INTRAMUSCULAR | Status: AC
  Administered 2024-09-18: 100 ug via SUBCUTANEOUS
  Filled 2024-09-18 (×3): qty 0.5

## 2024-09-18 MED ORDER — ANTICOAGULANT SODIUM CITRATE 4% (200MG/5ML) IV SOLN: 5.0000 mL | Status: DC | PRN

## 2024-09-18 MED ORDER — CEFAZOLIN SODIUM-DEXTROSE 1-4 GM/50ML-% IV SOLN
1.0000 g | Freq: Every day | INTRAVENOUS | Status: AC
  Administered 2024-09-18 – 2024-09-20 (×3): 1 g via INTRAVENOUS
  Filled 2024-09-18 (×4): qty 50

## 2024-09-18 MED ORDER — ALTEPLASE 2 MG IJ SOLR: 2.0000 mg | Freq: Once | INTRAMUSCULAR | Status: DC | PRN

## 2024-09-18 MED ORDER — LIDOCAINE-PRILOCAINE 2.5-2.5 % EX CREA: 1.0000 | TOPICAL_CREAM | CUTANEOUS | Status: DC | PRN

## 2024-09-18 MED ORDER — CHLORHEXIDINE GLUCONATE CLOTH 2 % EX PADS
6.0000 | MEDICATED_PAD | Freq: Every day | CUTANEOUS | Status: AC
  Administered 2024-09-19 – 2024-09-20 (×2): 6 via TOPICAL

## 2024-09-18 MED ORDER — CHLORHEXIDINE GLUCONATE CLOTH 2 % EX PADS: 6.0000 | MEDICATED_PAD | Freq: Every day | CUTANEOUS | Status: DC

## 2024-09-18 MED ORDER — HEPARIN SODIUM (PORCINE) 1000 UNIT/ML IJ SOLN
INTRAMUSCULAR | Status: AC
  Filled 2024-09-18: qty 3

## 2024-09-18 MED ORDER — LIDOCAINE HCL (PF) 1 % IJ SOLN: 5.0000 mL | INTRAMUSCULAR | Status: DC | PRN

## 2024-09-18 MED ORDER — HEPARIN SODIUM (PORCINE) 1000 UNIT/ML DIALYSIS: 1000.0000 [IU] | INTRAMUSCULAR | Status: DC | PRN

## 2024-09-18 NOTE — Progress Notes (Signed)
 PHARMACY NOTE:  ANTIMICROBIAL RENAL DOSAGE ADJUSTMENT  Current antimicrobial regimen includes a mismatch between antimicrobial dosage and estimated renal function.  As per policy approved by the Pharmacy & Therapeutics and Medical Executive Committees, the antimicrobial dosage will be adjusted accordingly.  Current antimicrobial dosage:  Cefazolin  2 gm IV Q 12 hours  Indication: MSSA bacteremia  Renal Function:  Estimated Creatinine Clearance: 13.5 mL/min (A) (by C-G formula based on SCr of 4.66 mg/dL (H)). [x]      On intermittent HD, scheduled: []      On CRRT    Antimicrobial dosage has been changed to:  Cefazolin  1 gm daily  Can eventually schedule with HD   Additional comments: Patient now ESRD   Thank you for allowing pharmacy to be a part of this patient's care.  Damien Quiet, PharmD, BCIDP Infectious Diseases Clinical Pharmacist Phone: [phone] 09/18/2024 2:01 PM

## 2024-09-18 NOTE — Progress Notes (Addendum)
" °   09/18/24 1527  Vitals  Temp 98 F (36.7 C)  Temp Source Oral  BP 119/60  MAP (mmHg) 80  Pulse Rate 65  Resp 18  MEWS COLOR  MEWS Score Color Green  Oxygen Therapy  SpO2 92 %  O2 Device Room Air   Transferred to room 338   2005: Placed on telemetry with nurse taking over [employer]. Aflutter noted. Hailey notifying MD. "

## 2024-09-18 NOTE — Progress Notes (Addendum)
 Contacted pt wife via phone to discuss transportation to and from HD and further options, as [facility] will not provide. During this call, son joined and expressed frustration with this process. Family states this is all new. Son asked for address of clinic and that he wanted a doctor to tell them where to go. Navigator provided address to [facility] [place] and tried explaining that pt is current with [facility] already. Navigator attempted explaining role, family continue to express frustration. Family requested to speak with a doctor. Family expressed desire for navigator to not call back.   Navigator informed social work, attending, and nephrologist at this time. Will continue to support as needed.   Lavanda Othell [facility] pearlie [phone]  Addendum 1057am Was directed to speak with other son, Eva, [phone].  informed that clinic and rehab cannot provide transport. he stated he would like to continue with a referral and that he would try to figure out a system where family can take him to his appointments. Thinks the other family members will come around. he is agreeable to a referral to [facility] [place] ([place] [facility] from [facility]) for a TTS 2nd shift appt. Will begin referral. Son requesting info on public transport services in [place], informed social work at this time.   Addendum 1126am Referral submitted for [facility] [place]. Awaiting financial and medical clearance. Will continue to assist and update.   Addendum 1:49pm Pt has been accepted into [facility]), for a MWF 12:20pm chair time. Tentative start date monday 2/9. Pt would need to arrive 11:45 for first appointment. Contacted pt son Eva at this time, who stated the family would do their best to make this work. Informed son that pt will be put on the waitlist for a TTS if any become available, but for now he is agreeable to this schedule. States he needs to go over this with his father and family.details  sent to son justin via text. AVS updated with info. Plan to meet with pt at bedside tomorrow and provide schedule letter. Care team including social work, clinic, attending, RN, and nephrologist updated.

## 2024-09-18 NOTE — Progress Notes (Signed)
 [facility] PROGRESS NOTE  Ricardo Mayer. (DOB: 09-Feb-1941) FMW:[id number] PCP: [name], DO Outpatient Specialists: Dr. Tobie, Nephrology  Brief Narrative: Ricardo JONETTA Etha Mickey. is an 84 y.o. 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 Tobie), HTN, HLD who presented to the ED on 09/15/2024 with progressive dyspnea on exertion, productive cough and swelling.   Initial evaluation revealed severe volume overload with proBNP 22,909, worsening renal function (Cr 5.46, BUN 129), high anion gap metabolic acidosis (CO2 12), anemia (Hgb 7.3), and bilateral pleural effusions with pulmonary vascular congestion on chest x-ray.  He was initially treated with IV diuretics but developed hypothermia (90.67F) and was found to have MSSA bacteremia, prompting initiation of cefazolin  2 g IV q8h and doxycycline  for concomitant community-acquired pneumonia.  Volume overload and acidosis have persisted despite high dose lasix  infusion and bicarbonate. After discussion amongst the ID, nephrology, cardiology, and medical teams, the plan is to insert trialysis catheter to initiate urgent HD, continue following surveillance blood cultures, and once MSSA confirmed to have cleared, remove temp line and insert TDC.   HD initiated 2/3, tolerated well. Hemodynamically stabilizing. Transfer to floor.  Subjective: HOH, son at bedside. Pt reports his breathing is better. Appetite finally back per son. No chest pain. No other complaints.   Objective: BP 116/72   Pulse 74   Temp 97.6 F (36.4 C)   Resp 18   Ht 5' 7 (1.702 m)   Wt 100.9 kg   SpO2 99%   BMI 34.84 kg/m   Gen: Obese elderly male  Pulm: Significant upper airway transmission but no lower wheezing. Inspiratory crackles bilateral bases CV: RRR, no rub, distant due to habitus. 3+ pitting LE edema GI: Soft, NT, ND, +BS Neuro: Alert and  oriented. No new focal deficits. Ext: Warm, no deformities. Skin: Lower legs wrapped (dressing c/d/I) and not uncovered for today's exam. No other rashes, lesions or ulcers on visualized skin   Bicarb 13 > 17 Cr 4.66 BUN 98 K 3.9 Phos 6.1.  Ferritin 649, iron 43, TIBC 169, 26% sat.   Hgb 7.3 > 7.5 Plt 106 > 103   TTE (09/17/2024): LVEF 35-40%, G2DD, moderate AS (possibly low flow), IVC dilated/blunted.  Assessment & Plan: MSSA bacteremia: Blood cultures (2/1) positive for MSSA in 1 of 2 collections. Currently afebrile, no leukocytosis, lactate within normal limits.  - Continue ancef . Duration likely 4-6 weeks. Appreciate ID consultation.  - TTE negative for vegetation, though AV very abnormal (known from prior). TEE will be performed once more stable. - Follow up further testing from Cx 2/1 and repeat Cx's 2/2 (NGTD).   CAP: Suspected based on dyspnea, productive cough and CXR opacities. PCT 0.31, WBC normal (though neutrophilia noted w/PMNs 8.8k), afebrile now but transiently hypothermic shortly after admission requiring bair hugger. In this case, some diagnostic uncertainty, but clinical status is precarious enough to warrant early abx and eventual deescalation. Covid, flu, RSV PCR's negative.  - Covered by ancef , doxycycline .  - Sputum culture sent but not adequate specimen.  Acute on chronic combined HFrEF: LVEF 30-35%, G1DD, mod-severe AS.  - Remained volume overloaded but oliguric on lasix  gtt 20 mg/h, managing volume with HD as well now. HD planned 2/3, 2/4, 2/5 - Continue strict I/O, daily weights.  - Holding BB, hydral w/soft BPs.    ESRD (progressed CKD 5) with refractory volume overload, uremia, and metabolic acidosis (w/VBG  showing compensation): Trial of medical management for acidosis, volume overload unsuccessful.  - D/w Dr. Dolan who will write orders for HD today. This has been expressed as a consistent, and persistent plan of care by pt and family. Appreciate Dr.  Kallie inserting temp cath line. Once bacteremia cleared, would be candidate for [facility]. Renal navigator/social worker consulted for outpatient dialysis arrangements  Anemia of CKD: Normocytic anemia w/hgb 7.3g/dl. s/p 1u RBCs 2/2, hgb stable at 7.3g/dl. No bleeding. 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  - Keep T&S UTD, as pt/family consent to transfusions as indicated. - ESA per nephrology. Ferritin elevated, has bacteremia, not giving IV iron.   HTN: Holding antihypertensives for now as above, monitor BP.   HLD: Continue statin  Deconditioning:  - Will get PT/OT involved   - Fall precautions   Bernardino KATHEE Come, MD [facility] [email or web address] 09/18/2024, 12:56 PM

## 2024-09-18 NOTE — Progress Notes (Signed)
 Pt transported to unit for 2.5hr tx. Used pt's R internal jugular for tx with no issues. Pt completed tx and has no s/s of distress noted.  09/18/24 1335  Vitals  Temp 97.7 F (36.5 C)  Pulse Rate 71  Resp 17  BP 134/66  SpO2 99 %  O2 Device Room Air  Weight 99.4 kg  Oxygen Therapy  Patient Activity (if Appropriate) In bed  Pulse Oximetry Type Continuous  Oximetry Probe Site Changed No  Post Treatment  Dialyzer Clearance Clear  Hemodialysis Intake (mL) 0 mL  Liters Processed 37.5  Fluid Removed (mL) 1000 mL  Tolerated HD Treatment Yes

## 2024-09-18 NOTE — Plan of Care (Signed)

## 2024-09-18 NOTE — Progress Notes (Addendum)
 [facility] NEPHROLOGY PROGRESS NOTE  Assessment/ Plan: Pt is a 84 y.o. yo male  with past medical history significant for hypertension, dyslipidemia, TIA, CHF with EF of 30 to 35%, CKD 5, anemia presented with dyspnea on exertion, generalized weakness and fatigue seen as a consultation for worsening renal function.   # Progressive CKD 5 with features of uremia, volume overload and acidosis, suboptimal response with IV diuretics.  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 -Started HD on 2/3 after discussion with the patient and his wife who agreed to proceed with dialysis.  Status post right IJ temporary HD catheter placed by surgeon, appreciated.  Plan for tunneled HD catheter after bacteremia clears.  ID is following.   - Discussed with the patient's son at the bedside today who agrees and happy with the dialysis treatment.  We will proceed with second HD today and plan for consecutive treatment tomorrow. -Renal navigator is following to arrange outpatient HD, transportation.  He is likely go to rehab.   # MSSA bacteremia: Currently on Ancef  per ID team.  Repeat blood culture sent on 2/2.  Follow the result.   # Acute on chronic CHF: EF of 30 to 35% and has a grade 1 diastolic dysfunction.  Suboptimal response with diuretics, now UF with HD.  Cardiology is following.   # Metabolic acidosis: Managing with dialysis, DC oral sodium bicarbonate .   # Anemia of chronic disease, CKD: Received a unit of blood transfusion, iron saturation 26%, serum iron 43.  No IV iron because of bacteremia.  Start Aranesp .   # CKD-MBD, hyperphosphatemia: Pending PTH level, phosphorus level expect to improve with dialysis.   Discussed with multiple care team members, coordinators via secure chat.  Subjective: Seen and examined at the bedside.  He tolerated dialysis well yesterday.  His son present at the bedside who said the patient looks more alert and had good dinner yesterday after dialysis.  Plan for second HD  today. Objective Vital signs in last 24 hours: Vitals:   09/18/24 0700 09/18/24 0750 09/18/24 0800 09/18/24 0900  BP: (!) 129/50  (!) 120/55   Pulse: 69 (!) 35 77 72  Resp: 20 14 20 20   Temp:  97.6 F (36.4 C)    TempSrc:  Oral    SpO2: 100% 97% 100% 100%  Weight:      Height:       Weight change: -0.5 kg  Intake/Output Summary (Last 24 hours) at 09/18/2024 1020 Last data filed at 09/18/2024 0651 Gross per 24 hour  Intake 1521.47 ml  Output 1000 ml  Net 521.47 ml       Labs: RENAL PANEL Recent Labs  Lab 09/15/24 2000 09/16/24 0147 09/17/24 0422 09/18/24 0426  NA 141 142 138 138  K 4.5 4.3 4.3 3.9  CL 109 109 107 103  CO2 12* 12* 13* 17*  GLUCOSE 83 74 79 100*  BUN 125* 121* 129* 98*  CREATININE 4.97* 4.92* 5.46* 4.66*  CALCIUM  8.8* 8.7* 8.4* 8.5*  MG  --  2.6* 2.4  --   PHOS  --  7.6* 8.0* 6.1*  ALBUMIN  --  3.2*  --  2.9*    Liver Function Tests: Recent Labs  Lab 09/16/24 0147 09/18/24 0426  AST 19  --   ALT 25  --   ALKPHOS 72  --   BILITOT <0.2  --   PROT 5.5*  --   ALBUMIN 3.2* 2.9*   No results for input(s): LIPASE, AMYLASE in the  last 168 hours. No results for input(s): AMMONIA in the last 168 hours. CBC: Recent Labs    07/31/24 0046 07/31/24 0511 07/31/24 2048 08/01/24 0232 08/16/24 1213 09/15/24 2000 09/16/24 0800 09/17/24 0422 09/18/24 0426  HGB  --    < >  --    < > 8.5* 7.3* 7.7* 7.3* 7.5*  MCV  --    < >  --    < > 96 95.9 95.1 94.5 92.1  FERRITIN  --   --  152  --   --   --   --   --  649*  TIBC 273  --   --   --   --   --   --   --  169*  IRON 43*  --   --   --   --   --   --   --  43*   < > = values in this interval not displayed.    Cardiac Enzymes: No results for input(s): CKTOTAL, CKMB, CKMBINDEX, TROPONINI in the last 168 hours. CBG: Recent Labs  Lab 09/16/24 1559  GLUCAP 96    Iron Studies:  Recent Labs    09/18/24 0426  IRON 43*  TIBC 169*  FERRITIN 649*   Studies/Results: ECHOCARDIOGRAM  COMPLETE Result Date: 09/17/2024    ECHOCARDIOGRAM REPORT   Patient Name:   Ricardo Mayer. Date of Exam: 09/17/2024 Medical Rec #:  [id number]          Height:       66.9 in Accession #:    [id number]         Weight:       218.7 lb Date of Birth:  Aug 18, 1940           BSA:          2.099 m Patient Age:    83 years           BP:           116/57 mmHg Patient Gender: M                  HR:           78 bpm. Exam Location:  [facility] Procedure: 2D Echo, 3D Echo, Cardiac Doppler, Color Doppler and Strain Analysis            (Both Spectral and Color Flow Doppler were utilized during            procedure). Indications:    Bacteremia R78.81                 Congestive Heart Failure I50.9  History:        Patient has prior history of Echocardiogram examinations, most                 recent 07/25/2024. Aortic Valve Disease and Mitral Valve                 Disease; Risk Factors:Hypertension, Diabetes, Dyslipidemia,                 Non-Smoker and Obesity.  Sonographer:    Aida Pizza RCS Referring Phys: [id number] [facility] AL-SULT[facility]  Sonographer Comments: Global longitudinal strain was attempted. IMPRESSIONS  1. Left ventricular ejection fraction, by estimation, is 35 to 40%. Left ventricular ejection fraction by 3D volume is 35 %. The left ventricle has moderately decreased function. The left ventricle demonstrates global hypokinesis. The left ventricular internal cavity size was moderately  dilated. Left ventricular diastolic parameters are consistent with Grade II diastolic dysfunction (pseudonormalization). Elevated left atrial pressure. The average left ventricular global longitudinal strain is -8.4 %.  The global longitudinal strain is abnormal.  2. Right ventricular systolic function is normal. The right ventricular size is normal. Tricuspid regurgitation signal is inadequate for assessing PA pressure.  3. Left atrial size was mild to moderately dilated.  4. The mitral valve is abnormal. Trivial mitral valve regurgitation.  Mild mitral stenosis. The mean mitral valve gradient is 3.0 mmHg. Severe mitral annular calcification.  5. The aortic valve has an indeterminant number of cusps. Moderate to severe restriction in the leaflet mobility. There is moderate calcification of the aortic valve. There is moderate thickening of the aortic valve. Aortic valve regurgitation is mild to moderate. 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 Aortic regurgitation PHT measures 433 msec. Aortic valve area, by VTI measures 1.26 cm. Aortic valve mean gradient measures 12.7 mmHg. Aortic valve Vmax measures 2.44 m/s.  6. Aortic dilatation noted. There is mild dilatation of the aortic root, measuring 40 mm.  7. The inferior vena cava is dilated in size with <50% respiratory variability, suggesting right atrial pressure of 15 mmHg. FINDINGS  Left Ventricle: Left ventricular ejection fraction, by estimation, is 35 to 40%. Left ventricular ejection fraction by 3D volume is 35 %. The left ventricle has moderately decreased function. The left ventricle demonstrates global hypokinesis. The average left ventricular global longitudinal strain is -8.4 %. Strain was performed and the global longitudinal strain is abnormal. The left ventricular internal cavity size was moderately dilated. There is no left ventricular hypertrophy. Left ventricular diastolic parameters are consistent with Grade II diastolic dysfunction (pseudonormalization). Elevated left atrial pressure. Right Ventricle: The right ventricular size is normal. No increase in right ventricular wall thickness. Right ventricular systolic function is normal. Tricuspid regurgitation signal is inadequate for assessing PA pressure. Left Atrium: Left atrial size was mild to moderately dilated. Right Atrium: Right atrial size was normal in size. Pericardium: There is no evidence of pericardial effusion. Mitral Valve: The mitral  valve is abnormal. Severe mitral annular calcification. Trivial mitral valve regurgitation. Mild mitral valve stenosis. MV peak gradient, 7.8 mmHg. The mean mitral valve gradient is 3.0 mmHg. Tricuspid Valve: The tricuspid valve is normal in structure. Tricuspid valve regurgitation is not demonstrated. No evidence of tricuspid stenosis. Aortic Valve: The aortic valve has an indeterminant number of cusps. There is moderate calcification of the aortic valve. There is moderate thickening of the aortic valve. Aortic valve regurgitation is mild to moderate. Aortic regurgitation PHT measures 433 msec. Moderate aortic stenosis is present. Aortic valve mean gradient measures 12.7 mmHg. Aortic valve peak gradient measures 23.9 mmHg. Aortic valve area, by VTI measures 1.26 cm. Pulmonic Valve: The pulmonic valve was not well visualized. Pulmonic valve regurgitation is not visualized. No evidence of pulmonic stenosis. Aorta: Aortic dilatation noted. There is mild dilatation of the aortic root, measuring 40 mm. Venous: The inferior vena cava is dilated in size with less than 50% respiratory variability, suggesting right atrial pressure of 15 mmHg. IAS/Shunts: No atrial level shunt detected by color flow Doppler. Additional Comments: 3D was performed not requiring image post processing on an independent workstation and was abnormal.  LEFT VENTRICLE PLAX 2D LVIDd:         6.00 cm         Diastology LVIDs:  4.70 cm         LV e' medial:    4.60 cm/s LV PW:         1.30 cm         LV E/e' medial:  27.3 LV IVS:        1.10 cm         LV e' lateral:   8.58 cm/s LVOT diam:     1.90 cm         LV E/e' lateral: 14.6 LV SV:         71 LV SV Index:   34              2D Longitudinal LVOT Area:     2.84 cm        Strain                                2D Strain GLS   -8.4 %                                Avg: LV Volumes (MOD) LV vol d, MOD    152.0 ml      3D Volume EF A4C:                           LV 3D EF:    Left LV vol s, MOD     95.4 ml                    ventricul A4C:                                        ar LV SV MOD A4C:   152.0 ml                   ejection                                             fraction                                             by 3D                                             volume is                                             35 %.                                 3D Volume EF:  3D EF:        35 %                                LV EDV:       206 ml                                LV ESV:       135 ml                                LV SV:        72 ml RIGHT VENTRICLE RV S prime:     13.40 cm/s TAPSE (M-mode): 2.0 cm LEFT ATRIUM             Index        RIGHT ATRIUM           Index LA diam:        4.10 cm 1.95 cm/m   RA Area:     18.40 cm LA Vol (A2C):   89.5 ml 42.64 ml/m  RA Volume:   49.50 ml  23.59 ml/m LA Vol (A4C):   87.9 ml 41.88 ml/m LA Biplane Vol: 96.1 ml 45.79 ml/m  AORTIC VALVE AV Area (Vmax):    1.24 cm AV Area (Vmean):   1.37 cm AV Area (VTI):     1.26 cm AV Vmax:           244.33 cm/s AV Vmean:          163.667 cm/s AV VTI:            0.569 m AV Peak Grad:      23.9 mmHg AV Mean Grad:      12.7 mmHg LVOT Vmax:         107.00 cm/s LVOT Vmean:        79.300 cm/s LVOT VTI:          0.252 m LVOT/AV VTI ratio: 0.44 AI PHT:            433 msec  AORTA Ao Root diam: 4.00 cm MITRAL VALVE MV Area (PHT): 4.70 cm     SHUNTS MV Area VTI:   2.30 cm     Systemic VTI:  0.25 m MV Peak grad:  7.8 mmHg     Systemic Diam: 1.90 cm MV Mean grad:  3.0 mmHg MV Vmax:       1.40 m/s MV Vmean:      73.8 cm/s MV Decel Time: 162 msec MV E velocity: 125.50 cm/s MV A velocity: 110.50 cm/s MV E/A ratio:  1.14 Vishnu Priya Mallipeddi Electronically signed by Diannah Late Mallipeddi Signature Date/Time: 09/17/2024/4:05:02 PM    Final    DG CHEST PORT 1 VIEW Result Date: 09/17/2024 CLINICAL DATA:  [id number] Encounter for central line placement [id number]. EXAM: PORTABLE CHEST 1 VIEW COMPARISON:   09/15/2024. FINDINGS: Redemonstration of bilateral small pleural effusions, right more than left without significant interval change. Bilateral lungs are otherwise grossly clear. Stable cardio-mediastinal silhouette. No acute osseous abnormalities. The soft tissues are within normal limits. Interval placement of right IJ central venous catheter with its tip overlying the cavoatrial junction region. No pneumothorax. IMPRESSION: Interval placement of right IJ central venous catheter with its tip overlying the cavoatrial junction region. No pneumothorax.  Electronically Signed   By: Ree Molt M.D.   On: 09/17/2024 15:20    Medications: Infusions:   ceFAZolin  (ANCEF ) IV 2 g (09/18/24 0950)   doxycycline  (VIBRAMYCIN ) IV Stopped (09/18/24 0328)   furosemide  (LASIX ) 200 mg in dextrose  5 % 100 mL (2 mg/mL) infusion 20 mg/hr (09/18/24 0651)    Scheduled Medications:  sodium chloride    Intravenous Once   atorvastatin   10 mg Oral Daily   Chlorhexidine  Gluconate Cloth  6 each Topical Q0600   Chlorhexidine  Gluconate Cloth  6 each Topical Q0600   doxazosin   1 mg Oral QHS   heparin   5,000 Units Subcutaneous Q8H    have reviewed scheduled and prn medications.  Physical Exam: General:NAD, comfortable Heart:RRR, s1s2 nl Lungs: Basal crackles. Abdomen:soft, Non-tender, non-distended Extremities: edema+, bandage applied in the lower extremities Dialysis Access: Right IJ temporary HD line, site clean  Dorothy Polhemus Prasad Klarisa Barman 09/18/2024,10:20 AM  LOS: 3 days

## 2024-09-18 NOTE — TOC Progression Note (Signed)
 Transition of Care (TOC) - Progression Note    Patient Details  Name: Ricardo Mayer. MRN: [id number] Date of Birth: 03-18-1941  Transition of Care [facility]) CM/SW Contact  Sharlyne Stabs, RN Phone Number: 09/18/2024, 1:59 PM  Clinical Narrative:   Patient was bed offer at [facility] CM explained he will need transportation to HD. List of options provided if family can not transport. HD navigator is working on HD chair time.    Expected Discharge Plan: Skilled Nursing Facility Barriers to Discharge: Continued Medical Work up            Expected Discharge Plan and Services In-house Referral: Clinical Social Work Discharge Planning Services: CM Consult Post Acute Care Choice: Home Health, Durable Medical Equipment Living arrangements for the past 2 months: Single Family Home                       Social Drivers of Health (SDOH) Interventions SDOH Screenings   Food Insecurity: No Food Insecurity (09/17/2024)  Housing: Low Risk (09/17/2024)  Transportation Needs: No Transportation Needs (09/17/2024)  Utilities: Not At Risk (09/17/2024)  Alcohol Screen: Low Risk (08/20/2023)  Depression (PHQ2-9): Medium Risk (09/10/2024)  Financial Resource Strain: Low Risk (08/20/2023)  Physical Activity: Inactive (09/10/2024)  Social Connections: Socially Integrated (09/17/2024)  Stress: No Stress Concern Present (09/10/2024)  Tobacco Use: Low Risk (09/15/2024)  Health Literacy: Adequate Health Literacy (09/10/2024)    Readmission Risk Interventions    09/16/2024    2:27 PM 08/01/2024   11:37 AM  Readmission Risk Prevention Plan  Transportation Screening Complete Complete  PCP or Specialist Appt within 5-7 Days  Complete  Home Care Screening  Complete  Medication Review (RN CM)  Complete  HRI or Home Care Consult Complete   Social Work Consult for Recovery Care Planning/Counseling Complete   Palliative Care Screening Not Applicable   Medication Review Oceanographer) Complete

## 2024-09-18 NOTE — Consult Note (Signed)
 "    Virtual Visit via Video Note  I connected with Ricardo D Mctavish Jr. on @TODAY @ at  by a video enabled telemedicine application and verified that I am speaking with the correct person using two identifiers.  Location: Patient: [facility] Provider: Darryle Law   I discussed the limitations of evaluation and management by telemedicine and the availability of in person appointments. The patient expressed understanding and agreed to proceed.   Subjective: No new complaints   Antibiotics:  Anti-infectives (From admission, onward)    Start     Dose/Rate Route Frequency Ordered Stop   09/16/24 2200  ceFAZolin  (ANCEF ) IVPB 2g/100 mL premix        2 g 200 mL/hr over 30 Minutes Intravenous Every 12 hours 09/16/24 1758     09/16/24 0100  cefTRIAXone  (ROCEPHIN ) 2 g in sodium chloride  0.9 % 100 mL IVPB  Status:  Discontinued        2 g 200 mL/hr over 30 Minutes Intravenous Every 24 hours 09/16/24 0009 09/16/24 1900   09/16/24 0100  doxycycline  (VIBRAMYCIN ) 100 mg in sodium chloride  0.9 % 250 mL IVPB        100 mg 125 mL/hr over 120 Minutes Intravenous Every 12 hours 09/16/24 0009         Medications: Scheduled Meds:  sodium chloride    Intravenous Once   atorvastatin   10 mg Oral Daily   Chlorhexidine  Gluconate Cloth  6 each Topical Q0600   doxazosin   1 mg Oral QHS   heparin   5,000 Units Subcutaneous Q8H   Continuous Infusions:   ceFAZolin  (ANCEF ) IV Stopped (09/17/24 2230)   doxycycline  (VIBRAMYCIN ) IV Stopped (09/18/24 0328)   furosemide  (LASIX ) 200 mg in dextrose  5 % 100 mL (2 mg/mL) infusion 20 mg/hr (09/18/24 0651)   PRN Meds:.acetaminophen , guaiFENesin -dextromethorphan , ipratropium-albuterol , melatonin, polyethylene glycol, prochlorperazine     Objective: Weight change: -0.5 kg  Intake/Output Summary (Last 24 hours) at 09/18/2024 0908 Last data filed at 09/18/2024 0651 Gross per 24 hour  Intake 1521.47 ml  Output 1000 ml  Net 521.47 ml   Blood pressure (!) 129/50, pulse  (!) 35, temperature 97.6 F (36.4 C), temperature source Oral, resp. rate 14, height 5' 7 (1.702 m), weight 100.3 kg, SpO2 97%. Temp:  [97.5 F (36.4 C)-98.1 F (36.7 C)] 97.6 F (36.4 C) (02/04 0750) Pulse Rate:  [35-82] 35 (02/04 0750) Resp:  [13-26] 14 (02/04 0750) BP: (94-137)/(42-64) 129/50 (02/04 0700) SpO2:  [97 %-100 %] 97 % (02/04 0750) Weight:  [98.7 kg-100.3 kg] 100.3 kg (02/04 0356)  Physical Exam: Physical Exam Constitutional:      Appearance: Normal appearance.  HENT:     Head: Normocephalic and atraumatic.  Eyes:     General:        Right eye: No discharge.        Left eye: No discharge.     Extraocular Movements: Extraocular movements intact.     Pupils: Pupils are equal, round, and reactive to light.  Cardiovascular:     Rate and Rhythm: Normal rate and regular rhythm.  Pulmonary:     Effort: No respiratory distress.     Breath sounds: No wheezing.  Abdominal:     General: There is no distension.  Musculoskeletal:        General: Normal range of motion.     Cervical back: Normal range of motion and neck supple.     Right lower leg: Edema present.     Left lower leg: Edema present.  Skin:    General: Skin is warm and dry.  Neurological:     General: No focal deficit present.     Mental Status: He is alert and oriented to person, place, and time.  Psychiatric:        Mood and Affect: Mood normal.        Behavior: Behavior normal.        Thought Content: Thought content normal.        Judgment: Judgment normal.     Internal jugular temp catheter line CBC:    BMET Recent Labs    09/17/24 0422 09/18/24 0426  NA 138 138  K 4.3 3.9  CL 107 103  CO2 13* 17*  GLUCOSE 79 100*  BUN 129* 98*  CREATININE 5.46* 4.66*  CALCIUM  8.4* 8.5*     Liver Panel  Recent Labs    09/16/24 0147 09/18/24 0426  PROT 5.5*  --   ALBUMIN 3.2* 2.9*  AST 19  --   ALT 25  --   ALKPHOS 72  --   BILITOT <0.2  --        Sedimentation Rate No results  for input(s): ESRSEDRATE in the last 72 hours. C-Reactive Protein No results for input(s): CRP in the last 72 hours.  Micro Results: Recent Results (from the past 720 hours)  Culture, blood (routine x 2)     Status: None (Preliminary result)   Collection Time: 09/15/24  8:00 PM   Specimen: Right Antecubital; Blood  Result Value Ref Range Status   Specimen Description RIGHT ANTECUBITAL  Final   Special Requests   Final    BOTTLES DRAWN AEROBIC AND ANAEROBIC Blood Culture adequate volume   Culture   Final    NO GROWTH 3 DAYS Performed at [facility], [address] [place], [place] [address]    Report Status PENDING  Incomplete  Resp panel by RT-PCR (RSV, Flu A&B, Covid) Anterior Nasal Swab     Status: None   Collection Time: 09/15/24  8:22 PM   Specimen: Anterior Nasal Swab  Result Value Ref Range Status   SARS Coronavirus 2 by RT PCR NEGATIVE NEGATIVE Final    Comment: (NOTE) SARS-CoV-2 target nucleic acids are NOT DETECTED.  The SARS-CoV-2 RNA is generally detectable in upper respiratory specimens during the acute phase of infection. The lowest concentration of SARS-CoV-2 viral copies this assay can detect is 138 copies/mL. A negative result does not preclude SARS-Cov-2 infection and should not be used as the sole basis for treatment or other patient management decisions. A negative result may occur with  improper specimen collection/handling, submission of specimen other than nasopharyngeal swab, presence of viral mutation(s) within the areas targeted by this assay, and inadequate number of viral copies(<138 copies/mL). A negative result must be combined with clinical observations, patient history, and epidemiological information. The expected result is Negative.  Fact Sheet for Patients:  bloggercourse.com  Fact Sheet for Healthcare Providers:  seriousbroker.it  This test is no t yet approved or cleared by the  [place]  FDA and  has been authorized for detection and/or diagnosis of SARS-CoV-2 by FDA under an Emergency Use Authorization (EUA). This EUA will remain  in effect (meaning this test can be used) for the duration of the COVID-19 declaration under Section 564(b)(1) of the Act, 21 U.S.C.section 360bbb-3(b)(1), unless the authorization is terminated  or revoked sooner.       Influenza A by PCR NEGATIVE NEGATIVE Final   Influenza B by PCR NEGATIVE  NEGATIVE Final    Comment: (NOTE) The Xpert Xpress SARS-CoV-2/FLU/RSV plus assay is intended as an aid in the diagnosis of influenza from Nasopharyngeal swab specimens and should not be used as a sole basis for treatment. Nasal washings and aspirates are unacceptable for Xpert Xpress SARS-CoV-2/FLU/RSV testing.  Fact Sheet for Patients: bloggercourse.com  Fact Sheet for Healthcare Providers: seriousbroker.it  This test is not yet approved or cleared by the [place]  FDA and has been authorized for detection and/or diagnosis of SARS-CoV-2 by FDA under an Emergency Use Authorization (EUA). This EUA will remain in effect (meaning this test can be used) for the duration of the COVID-19 declaration under Section 564(b)(1) of the Act, 21 U.S.C. section 360bbb-3(b)(1), unless the authorization is terminated or revoked.     Resp Syncytial Virus by PCR NEGATIVE NEGATIVE Final    Comment: (NOTE) Fact Sheet for Patients: bloggercourse.com  Fact Sheet for Healthcare Providers: seriousbroker.it  This test is not yet approved or cleared by the [place]  FDA and has been authorized for detection and/or diagnosis of SARS-CoV-2 by FDA under an Emergency Use Authorization (EUA). This EUA will remain in effect (meaning this test can be used) for the duration of the COVID-19 declaration under Section 564(b)(1) of the Act, 21 U.S.C. section  360bbb-3(b)(1), unless the authorization is terminated or revoked.  Performed at [facility], [address], [place], [place] [address]   Culture, blood (routine x 2)     Status: Abnormal   Collection Time: 09/15/24  8:42 PM   Specimen: BLOOD RIGHT HAND  Result Value Ref Range Status   Specimen Description   Final    BLOOD RIGHT HAND Performed at [facility], [address], [place], [place] [address]    Special Requests   Final    BOTTLES DRAWN AEROBIC AND ANAEROBIC Blood Culture adequate volume Performed at [facility], [address], [place], [place] [address]    Culture  Setup Time   Final    AEROBIC BOTTLE ONLY GRAM POSITIVE COCCI IN CLUSTERS Gram Stain Report Called to,Read Back By and Verified With: H HACKER AT 1132 ON 02.02.26 BY ADGER J  CRITICAL RESULT CALLED TO, READ BACK BY AND VERIFIED WITH: RN DOROTHA COLLET  [phone] @ 1755 FH Performed at [facility], [address], [place], [place] [address]    Culture STAPHYLOCOCCUS AUREUS (A)  Final   Report Status 09/18/2024 FINAL  Final   Organism ID, Bacteria STAPHYLOCOCCUS AUREUS  Final      Susceptibility   Staphylococcus aureus - MIC*    CIPROFLOXACIN  <=0.5 SENSITIVE Sensitive     ERYTHROMYCIN >=8 RESISTANT Resistant     GENTAMICIN <=0.5 SENSITIVE Sensitive     OXACILLIN <=0.25 SENSITIVE Sensitive     TETRACYCLINE <=1 SENSITIVE Sensitive     VANCOMYCIN 1 SENSITIVE Sensitive     TRIMETH /SULFA  <=10 SENSITIVE Sensitive     CLINDAMYCIN RESISTANT Resistant     RIFAMPIN <=0.5 SENSITIVE Sensitive     Inducible Clindamycin POSITIVE Resistant     LINEZOLID 2 SENSITIVE Sensitive     * STAPHYLOCOCCUS AUREUS  Blood Culture ID Panel (Reflexed)     Status: Abnormal   Collection Time: 09/15/24  8:42 PM  Result Value Ref Range Status   Enterococcus faecalis NOT DETECTED NOT DETECTED Final   Enterococcus Faecium NOT DETECTED NOT DETECTED Final   Listeria monocytogenes NOT DETECTED NOT DETECTED Final   Staphylococcus species  DETECTED (A) NOT DETECTED Final    Comment: CRITICAL RESULT  CALLED TO, READ BACK BY AND VERIFIED WITH: RN JSABRA COLLET  [phone] @ 1755 FH    Staphylococcus aureus (BCID) DETECTED (A) NOT DETECTED Final    Comment: CRITICAL RESULT CALLED TO, READ BACK BY AND VERIFIED WITH: RN JSABRA COLLET  [phone] @ 1755 FH    Staphylococcus epidermidis NOT DETECTED NOT DETECTED Final   Staphylococcus lugdunensis NOT DETECTED NOT DETECTED Final   Streptococcus species NOT DETECTED NOT DETECTED Final   Streptococcus agalactiae NOT DETECTED NOT DETECTED Final   Streptococcus pneumoniae NOT DETECTED NOT DETECTED Final   Streptococcus pyogenes NOT DETECTED NOT DETECTED Final   A.calcoaceticus-baumannii NOT DETECTED NOT DETECTED Final   Bacteroides fragilis NOT DETECTED NOT DETECTED Final   Enterobacterales NOT DETECTED NOT DETECTED Final   Enterobacter cloacae complex NOT DETECTED NOT DETECTED Final   Escherichia coli NOT DETECTED NOT DETECTED Final   Klebsiella aerogenes NOT DETECTED NOT DETECTED Final   Klebsiella oxytoca NOT DETECTED NOT DETECTED Final   Klebsiella pneumoniae NOT DETECTED NOT DETECTED Final   Proteus species NOT DETECTED NOT DETECTED Final   Salmonella species NOT DETECTED NOT DETECTED Final   Serratia marcescens NOT DETECTED NOT DETECTED Final   Haemophilus influenzae NOT DETECTED NOT DETECTED Final   Neisseria meningitidis NOT DETECTED NOT DETECTED Final   Pseudomonas aeruginosa NOT DETECTED NOT DETECTED Final   Stenotrophomonas maltophilia NOT DETECTED NOT DETECTED Final   Candida albicans NOT DETECTED NOT DETECTED Final   Candida auris NOT DETECTED NOT DETECTED Final   Candida glabrata NOT DETECTED NOT DETECTED Final   Candida krusei NOT DETECTED NOT DETECTED Final   Candida parapsilosis NOT DETECTED NOT DETECTED Final   Candida tropicalis NOT DETECTED NOT DETECTED Final   Cryptococcus neoformans/gattii NOT DETECTED NOT DETECTED Final   Meth resistant mecA/C and MREJ NOT DETECTED NOT  DETECTED Final    Comment: Performed at [facility], [address], [place], [place] [address]  Culture, blood (Routine X 2) w Reflex to ID Panel     Status: None (Preliminary result)   Collection Time: 09/16/24  8:01 PM   Specimen: BLOOD  Result Value Ref Range Status   Specimen Description BLOOD BLOOD RIGHT HAND  Final   Special Requests   Final    BOTTLES DRAWN AEROBIC AND ANAEROBIC Blood Culture adequate volume   Culture   Final    NO GROWTH 2 DAYS Performed at [facility], [address], [place], [place] [address]    Report Status PENDING  Incomplete  Culture, blood (Routine X 2) w Reflex to ID Panel     Status: None (Preliminary result)   Collection Time: 09/16/24  8:04 PM   Specimen: BLOOD  Result Value Ref Range Status   Specimen Description BLOOD BLOOD RIGHT HAND  Final   Special Requests   Final    BOTTLES DRAWN AEROBIC AND ANAEROBIC Blood Culture adequate volume   Culture   Final    NO GROWTH 2 DAYS Performed at [facility], [address] [place], [place] [address]    Report Status PENDING  Incomplete  Expectorated Sputum Assessment w Gram Stain, Rflx to Resp Cult     Status: None   Collection Time: 09/16/24  8:39 PM   Specimen: Expectorated Sputum  Result Value Ref Range Status   Specimen Description EXPECTORATED SPUTUM  Final   Special Requests Normal  Final   Sputum evaluation   Final    Sputum specimen not acceptable for testing.  Please recollect.   RESULT  CALLED TO, READ BACK BY AND VERIFIED WITH: K THOMAS,RN@[id number]  09/16/24 [facility] Performed at [facility], [address], [place], [place] [address]    Report Status 09/16/2024 FINAL  Final  MRSA Next Gen by PCR, Nasal     Status: Abnormal   Collection Time: 09/17/24 12:40 PM   Specimen: Nasal Mucosa; Nasal Swab  Result Value Ref Range Status   MRSA by PCR Next Gen DETECTED (A) NOT DETECTED Final    Comment: RESULT CALLED TO, READ BACK BY AND VERIFIED WITH: C KINSLEY,RN@2216  09/17/24 MK (NOTE) The  GeneXpert MRSA Assay (FDA approved for NASAL specimens only), is one component of a comprehensive MRSA colonization surveillance program. It is not intended to diagnose MRSA infection nor to guide or monitor treatment for MRSA infections. Test performance is not FDA approved in patients less than 7 years old. Performed at [facility], [address] [place], [place] [address]     Studies/Results: ECHOCARDIOGRAM COMPLETE Result Date: 09/17/2024    ECHOCARDIOGRAM REPORT   Patient Name:   Aubrey Voong. Date of Exam: 09/17/2024 Medical Rec #:  [id number]          Height:       66.9 in Accession #:    [id number]         Weight:       218.7 lb Date of Birth:  01-18-1941           BSA:          2.099 m Patient Age:    83 years           BP:           116/57 mmHg Patient Gender: M                  HR:           78 bpm. Exam Location:  [facility] Procedure: 2D Echo, 3D Echo, Cardiac Doppler, Color Doppler and Strain Analysis            (Both Spectral and Color Flow Doppler were utilized during            procedure). Indications:    Bacteremia R78.81                 Congestive Heart Failure I50.9  History:        Patient has prior history of Echocardiogram examinations, most                 recent 07/25/2024. Aortic Valve Disease and Mitral Valve                 Disease; Risk Factors:Hypertension, Diabetes, Dyslipidemia,                 Non-Smoker and Obesity.  Sonographer:    Aida Pizza RCS Referring Phys: [id number] [facility] AL-SULTANI  Sonographer Comments: Global longitudinal strain was attempted. IMPRESSIONS  1. Left ventricular ejection fraction, by estimation, is 35 to 40%. Left ventricular ejection fraction by 3D volume is 35 %. The left ventricle has moderately decreased function. The left ventricle demonstrates global hypokinesis. The left ventricular internal cavity size was moderately dilated. Left ventricular diastolic parameters are consistent with Grade II diastolic dysfunction (pseudonormalization).  Elevated left atrial pressure. The average left ventricular global longitudinal strain is -8.4 %.  The global longitudinal strain is abnormal.  2. Right ventricular systolic function is normal. The right ventricular size is normal. Tricuspid regurgitation signal is inadequate for assessing PA  pressure.  3. Left atrial size was mild to moderately dilated.  4. The mitral valve is abnormal. Trivial mitral valve regurgitation. Mild mitral stenosis. The mean mitral valve gradient is 3.0 mmHg. Severe mitral annular calcification.  5. The aortic valve has an indeterminant number of cusps. Moderate to severe restriction in the leaflet mobility. There is moderate calcification of the aortic valve. There is moderate thickening of the aortic valve. Aortic valve regurgitation is mild to moderate. 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 Aortic regurgitation PHT measures 433 msec. Aortic valve area, by VTI measures 1.26 cm. Aortic valve mean gradient measures 12.7 mmHg. Aortic valve Vmax measures 2.44 m/s.  6. Aortic dilatation noted. There is mild dilatation of the aortic root, measuring 40 mm.  7. The inferior vena cava is dilated in size with <50% respiratory variability, suggesting right atrial pressure of 15 mmHg. FINDINGS  Left Ventricle: Left ventricular ejection fraction, by estimation, is 35 to 40%. Left ventricular ejection fraction by 3D volume is 35 %. The left ventricle has moderately decreased function. The left ventricle demonstrates global hypokinesis. The average left ventricular global longitudinal strain is -8.4 %. Strain was performed and the global longitudinal strain is abnormal. The left ventricular internal cavity size was moderately dilated. There is no left ventricular hypertrophy. Left ventricular diastolic parameters are consistent with Grade II diastolic dysfunction (pseudonormalization). Elevated left atrial  pressure. Right Ventricle: The right ventricular size is normal. No increase in right ventricular wall thickness. Right ventricular systolic function is normal. Tricuspid regurgitation signal is inadequate for assessing PA pressure. Left Atrium: Left atrial size was mild to moderately dilated. Right Atrium: Right atrial size was normal in size. Pericardium: There is no evidence of pericardial effusion. Mitral Valve: The mitral valve is abnormal. Severe mitral annular calcification. Trivial mitral valve regurgitation. Mild mitral valve stenosis. MV peak gradient, 7.8 mmHg. The mean mitral valve gradient is 3.0 mmHg. Tricuspid Valve: The tricuspid valve is normal in structure. Tricuspid valve regurgitation is not demonstrated. No evidence of tricuspid stenosis. Aortic Valve: The aortic valve has an indeterminant number of cusps. There is moderate calcification of the aortic valve. There is moderate thickening of the aortic valve. Aortic valve regurgitation is mild to moderate. Aortic regurgitation PHT measures 433 msec. Moderate aortic stenosis is present. Aortic valve mean gradient measures 12.7 mmHg. Aortic valve peak gradient measures 23.9 mmHg. Aortic valve area, by VTI measures 1.26 cm. Pulmonic Valve: The pulmonic valve was not well visualized. Pulmonic valve regurgitation is not visualized. No evidence of pulmonic stenosis. Aorta: Aortic dilatation noted. There is mild dilatation of the aortic root, measuring 40 mm. Venous: The inferior vena cava is dilated in size with less than 50% respiratory variability, suggesting right atrial pressure of 15 mmHg. IAS/Shunts: No atrial level shunt detected by color flow Doppler. Additional Comments: 3D was performed not requiring image post processing on an independent workstation and was abnormal.  LEFT VENTRICLE PLAX 2D LVIDd:         6.00 cm         Diastology LVIDs:         4.70 cm         LV e' medial:    4.60 cm/s LV PW:         1.30 cm         LV E/e' medial:  27.3  LV IVS:        1.10 cm  LV e' lateral:   8.58 cm/s LVOT diam:     1.90 cm         LV E/e' lateral: 14.6 LV SV:         71 LV SV Index:   34              2D Longitudinal LVOT Area:     2.84 cm        Strain                                2D Strain GLS   -8.4 %                                Avg: LV Volumes (MOD) LV vol d, MOD    152.0 ml      3D Volume EF A4C:                           LV 3D EF:    Left LV vol s, MOD    95.4 ml                    ventricul A4C:                                        ar LV SV MOD A4C:   152.0 ml                   ejection                                             fraction                                             by 3D                                             volume is                                             35 %.                                 3D Volume EF:                                3D EF:        35 %                                LV EDV:       206 ml  LV ESV:       135 ml                                LV SV:        72 ml RIGHT VENTRICLE RV S prime:     13.40 cm/s TAPSE (M-mode): 2.0 cm LEFT ATRIUM             Index        RIGHT ATRIUM           Index LA diam:        4.10 cm 1.95 cm/m   RA Area:     18.40 cm LA Vol (A2C):   89.5 ml 42.64 ml/m  RA Volume:   49.50 ml  23.59 ml/m LA Vol (A4C):   87.9 ml 41.88 ml/m LA Biplane Vol: 96.1 ml 45.79 ml/m  AORTIC VALVE AV Area (Vmax):    1.24 cm AV Area (Vmean):   1.37 cm AV Area (VTI):     1.26 cm AV Vmax:           244.33 cm/s AV Vmean:          163.667 cm/s AV VTI:            0.569 m AV Peak Grad:      23.9 mmHg AV Mean Grad:      12.7 mmHg LVOT Vmax:         107.00 cm/s LVOT Vmean:        79.300 cm/s LVOT VTI:          0.252 m LVOT/AV VTI ratio: 0.44 AI PHT:            433 msec  AORTA Ao Root diam: 4.00 cm MITRAL VALVE MV Area (PHT): 4.70 cm     SHUNTS MV Area VTI:   2.30 cm     Systemic VTI:  0.25 m MV Peak grad:  7.8 mmHg     Systemic Diam: 1.90 cm MV Mean grad:  3.0  mmHg MV Vmax:       1.40 m/s MV Vmean:      73.8 cm/s MV Decel Time: 162 msec MV E velocity: 125.50 cm/s MV A velocity: 110.50 cm/s MV E/A ratio:  1.14 Vishnu Priya Mallipeddi Electronically signed by Diannah Late Mallipeddi Signature Date/Time: 09/17/2024/4:05:02 PM    Final    DG CHEST PORT 1 VIEW Result Date: 09/17/2024 CLINICAL DATA:  [id number] Encounter for central line placement [id number]. EXAM: PORTABLE CHEST 1 VIEW COMPARISON:  09/15/2024. FINDINGS: Redemonstration of bilateral small pleural effusions, right more than left without significant interval change. Bilateral lungs are otherwise grossly clear. Stable cardio-mediastinal silhouette. No acute osseous abnormalities. The soft tissues are within normal limits. Interval placement of right IJ central venous catheter with its tip overlying the cavoatrial junction region. No pneumothorax. IMPRESSION: Interval placement of right IJ central venous catheter with its tip overlying the cavoatrial junction region. No pneumothorax. Electronically Signed   By: Ree Molt M.D.   On: 09/17/2024 15:20      Assessment/Plan:  INTERVAL HISTORY: pts blood cultures taken on the 2nd without gtrowth but is early. 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 is a 84 y.o. male with hypertension hyperlipidemia TIA CHF chronic kidney disease who had been nearing need for hemodialysis. He was admitted with severe dyspnia and volume overload but then became hypothermic and hypotensive and blood cultures were taken which have yieled MSSA  He now has ttemp internal jugular cathet  #1 MSSA bacteremia: could have pneumonia as well but difficult to  teast apart how much is CHF volume overload  Will follow up on TTE  He will need a TEE  If his blood cutures from 2nd before HD catheter placement are NG and final at 5 days he wont necessarily need a catheter holiday though if they do grow staph he will need a line holiday  Monitor for metastic sites of infectoin   CRITICAL CARE Performed by: Jomarie [place]   Total critical care time: 31 minutes  Critical care time was exclusive of separately billable procedures and treating other patients.  Critical care was necessary to treat or prevent imminent or life-threatening deterioration.  Critical care was time spent personally by me on the following activities: development of treatment plan with patient and/or surrogate as well as nursing, discussions with consultants, evaluation of patient's response to treatment, examination of patient, obtaining history from patient or surrogate, ordering and performing treatments and interventions, ordering and review of laboratory studies, ordering and review of radiographic studies, pulse oximetry and re-evaluation of patient's condition.   Evaluation of the patient requires complex antimicrobial therapy evaluation, counseling , isolation needs to reduce disease transmission and risk assessment and mitigation.      LOS: 3 days   Jomarie Salinas Rothman 09/18/2024, 9:08 AM  "

## 2024-09-18 NOTE — Progress Notes (Signed)
 "   Progress Note  Patient Name: Ricardo Mayer. Date of Encounter: 09/18/2024  Primary Cardiologist: Georganna Archer, MD  Subjective   On hemodialysis through central venous catheter.  SOB improved.  Inpatient Medications    Scheduled Meds:  sodium chloride    Intravenous Once   atorvastatin   10 mg Oral Daily   Chlorhexidine  Gluconate Cloth  6 each Topical Q0600   Chlorhexidine  Gluconate Cloth  6 each Topical Q0600   darbepoetin (ARANESP ) injection - DIALYSIS  100 mcg Subcutaneous Q Wed-1800   doxazosin   1 mg Oral QHS   heparin   5,000 Units Subcutaneous Q8H   Continuous Infusions:  anticoagulant sodium citrate       ceFAZolin  (ANCEF ) IV 2 g (09/18/24 0950)   PRN Meds: acetaminophen , alteplase , anticoagulant sodium citrate , guaiFENesin -dextromethorphan , heparin , ipratropium-albuterol , lidocaine  (PF), lidocaine -prilocaine , melatonin, pentafluoroprop-tetrafluoroeth, polyethylene glycol, prochlorperazine    Vital Signs    Vitals:   09/18/24 1052 09/18/24 1059 09/18/24 1100 09/18/24 1115  BP: 115/61 (!) 107/56 (!) 110/58 112/60  Pulse: 70 68 75 64  Resp: 17 16 17 16   Temp: 97.6 F (36.4 C)     TempSrc:      SpO2: 99% 100% 99% 99%  Weight: 100.9 kg     Height:        Intake/Output Summary (Last 24 hours) at 09/18/2024 1126 Last data filed at 09/18/2024 0651 Gross per 24 hour  Intake 1521.47 ml  Output 1000 ml  Net 521.47 ml   Filed Weights   09/17/24 1823 09/18/24 0356 09/18/24 1052  Weight: 98.7 kg 100.3 kg 100.9 kg    Telemetry     Personally reviewed.  Appears to be in Afib but has baseline artifact  ECG    No events.  Physical Exam   GEN: No acute distress.   Neck: JVD unable to examine Cardiac: RRR, no murmur, rub, or gallop.  Respiratory: Nonlabored. Clear to auscultation bilaterally. GI: Soft, nontender, bowel sounds present. MS: 2-3+ pitting edema; No deformity. Neuro:  Nonfocal. Psych: Somnolent  Labs    Chemistry Recent Labs  Lab  09/16/24 0147 09/17/24 0422 09/18/24 0426  NA 142 138 138  K 4.3 4.3 3.9  CL 109 107 103  CO2 12* 13* 17*  GLUCOSE 74 79 100*  BUN 121* 129* 98*  CREATININE 4.92* 5.46* 4.66*  CALCIUM  8.7* 8.4* 8.5*  PROT 5.5*  --   --   ALBUMIN 3.2*  --  2.9*  AST 19  --   --   ALT 25  --   --   ALKPHOS 72  --   --   BILITOT <0.2  --   --   GFRNONAA 11* 10* 12*  ANIONGAP 21* 18* 18*     Hematology Recent Labs  Lab 09/16/24 0800 09/17/24 0422 09/18/24 0426  WBC 9.4 10.5 9.2  RBC 2.46* 2.37* 2.42*  HGB 7.7* 7.3* 7.5*  HCT 23.4* 22.4* 22.3*  MCV 95.1 94.5 92.1  MCH 31.3 30.8 31.0  MCHC 32.9 32.6 33.6  RDW 16.1* 16.3* 16.1*  PLT 130* 106* 103*    Cardiac EnzymesNo results for input(s): TROPONINIHS in the last 720 hours.  BNP Recent Labs  Lab 09/15/24 2000  PROBNP 22,909.0*     DDimerNo results for input(s): DDIMER in the last 168 hours.   Radiology    ECHOCARDIOGRAM COMPLETE Result Date: 09/17/2024    ECHOCARDIOGRAM REPORT   Patient Name:   Ricardo Mayer. Date of Exam: 09/17/2024 Medical Rec #:  [id number]  Height:       66.9 in Accession #:    [id number]         Weight:       218.7 lb Date of Birth:  18-May-1941           BSA:          2.099 m Patient Age:    83 years           BP:           116/57 mmHg Patient Gender: M                  HR:           78 bpm. Exam Location:  [facility] Procedure: 2D Echo, 3D Echo, Cardiac Doppler, Color Doppler and Strain Analysis            (Both Spectral and Color Flow Doppler were utilized during            procedure). Indications:    Bacteremia R78.81                 Congestive Heart Failure I50.9  History:        Patient has prior history of Echocardiogram examinations, most                 recent 07/25/2024. Aortic Valve Disease and Mitral Valve                 Disease; Risk Factors:Hypertension, Diabetes, Dyslipidemia,                 Non-Smoker and Obesity.  Sonographer:    Aida Pizza RCS Referring Phys: [id number] [facility] AL-SULT[facility]   Sonographer Comments: Global longitudinal strain was attempted. IMPRESSIONS  1. Left ventricular ejection fraction, by estimation, is 35 to 40%. Left ventricular ejection fraction by 3D volume is 35 %. The left ventricle has moderately decreased function. The left ventricle demonstrates global hypokinesis. The left ventricular internal cavity size was moderately dilated. Left ventricular diastolic parameters are consistent with Grade II diastolic dysfunction (pseudonormalization). Elevated left atrial pressure. The average left ventricular global longitudinal strain is -8.4 %.  The global longitudinal strain is abnormal.  2. Right ventricular systolic function is normal. The right ventricular size is normal. Tricuspid regurgitation signal is inadequate for assessing PA pressure.  3. Left atrial size was mild to moderately dilated.  4. The mitral valve is abnormal. Trivial mitral valve regurgitation. Mild mitral stenosis. The mean mitral valve gradient is 3.0 mmHg. Severe mitral annular calcification.  5. The aortic valve has an indeterminant number of cusps. Moderate to severe restriction in the leaflet mobility. There is moderate calcification of the aortic valve. There is moderate thickening of the aortic valve. Aortic valve regurgitation is mild to moderate. 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 Aortic regurgitation PHT measures 433 msec. Aortic valve area, by VTI measures 1.26 cm. Aortic valve mean gradient measures 12.7 mmHg. Aortic valve Vmax measures 2.44 m/s.  6. Aortic dilatation noted. There is mild dilatation of the aortic root, measuring 40 mm.  7. The inferior vena cava is dilated in size with <50% respiratory variability, suggesting right atrial pressure of 15 mmHg. FINDINGS  Left Ventricle: Left ventricular ejection fraction, by estimation, is 35 to 40%. Left ventricular ejection fraction by 3D volume is 35 %.  The left ventricle has moderately decreased function. The left ventricle demonstrates global hypokinesis. The average  left ventricular global longitudinal strain is -8.4 %. Strain was performed and the global longitudinal strain is abnormal. The left ventricular internal cavity size was moderately dilated. There is no left ventricular hypertrophy. Left ventricular diastolic parameters are consistent with Grade II diastolic dysfunction (pseudonormalization). Elevated left atrial pressure. Right Ventricle: The right ventricular size is normal. No increase in right ventricular wall thickness. Right ventricular systolic function is normal. Tricuspid regurgitation signal is inadequate for assessing PA pressure. Left Atrium: Left atrial size was mild to moderately dilated. Right Atrium: Right atrial size was normal in size. Pericardium: There is no evidence of pericardial effusion. Mitral Valve: The mitral valve is abnormal. Severe mitral annular calcification. Trivial mitral valve regurgitation. Mild mitral valve stenosis. MV peak gradient, 7.8 mmHg. The mean mitral valve gradient is 3.0 mmHg. Tricuspid Valve: The tricuspid valve is normal in structure. Tricuspid valve regurgitation is not demonstrated. No evidence of tricuspid stenosis. Aortic Valve: The aortic valve has an indeterminant number of cusps. There is moderate calcification of the aortic valve. There is moderate thickening of the aortic valve. Aortic valve regurgitation is mild to moderate. Aortic regurgitation PHT measures 433 msec. Moderate aortic stenosis is present. Aortic valve mean gradient measures 12.7 mmHg. Aortic valve peak gradient measures 23.9 mmHg. Aortic valve area, by VTI measures 1.26 cm. Pulmonic Valve: The pulmonic valve was not well visualized. Pulmonic valve regurgitation is not visualized. No evidence of pulmonic stenosis. Aorta: Aortic dilatation noted. There is mild dilatation of the aortic root, measuring 40 mm. Venous: The inferior  vena cava is dilated in size with less than 50% respiratory variability, suggesting right atrial pressure of 15 mmHg. IAS/Shunts: No atrial level shunt detected by color flow Doppler. Additional Comments: 3D was performed not requiring image post processing on an independent workstation and was abnormal.  LEFT VENTRICLE PLAX 2D LVIDd:         6.00 cm         Diastology LVIDs:         4.70 cm         LV e' medial:    4.60 cm/s LV PW:         1.30 cm         LV E/e' medial:  27.3 LV IVS:        1.10 cm         LV e' lateral:   8.58 cm/s LVOT diam:     1.90 cm         LV E/e' lateral: 14.6 LV SV:         71 LV SV Index:   34              2D Longitudinal LVOT Area:     2.84 cm        Strain                                2D Strain GLS   -8.4 %                                Avg: LV Volumes (MOD) LV vol d, MOD    152.0 ml      3D Volume EF A4C:                           LV 3D  EF:    Left LV vol s, MOD    95.4 ml                    ventricul A4C:                                        ar LV SV MOD A4C:   152.0 ml                   ejection                                             fraction                                             by 3D                                             volume is                                             35 %.                                 3D Volume EF:                                3D EF:        35 %                                LV EDV:       206 ml                                LV ESV:       135 ml                                LV SV:        72 ml RIGHT VENTRICLE RV S prime:     13.40 cm/s TAPSE (M-mode): 2.0 cm LEFT ATRIUM             Index        RIGHT ATRIUM           Index LA diam:        4.10 cm 1.95 cm/m   RA Area:     18.40 cm LA Vol (A2C):   89.5 ml 42.64 ml/m  RA Volume:   49.50 ml  23.59 ml/m LA Vol (A4C):   87.9 ml 41.88 ml/m LA Biplane Vol: 96.1 ml 45.79 ml/m  AORTIC VALVE AV Area (Vmax):  1.24 cm AV Area (Vmean):   1.37 cm AV Area (VTI):     1.26 cm AV  Vmax:           244.33 cm/s AV Vmean:          163.667 cm/s AV VTI:            0.569 m AV Peak Grad:      23.9 mmHg AV Mean Grad:      12.7 mmHg LVOT Vmax:         107.00 cm/s LVOT Vmean:        79.300 cm/s LVOT VTI:          0.252 m LVOT/AV VTI ratio: 0.44 AI PHT:            433 msec  AORTA Ao Root diam: 4.00 cm MITRAL VALVE MV Area (PHT): 4.70 cm     SHUNTS MV Area VTI:   2.30 cm     Systemic VTI:  0.25 m MV Peak grad:  7.8 mmHg     Systemic Diam: 1.90 cm MV Mean grad:  3.0 mmHg MV Vmax:       1.40 m/s MV Vmean:      73.8 cm/s MV Decel Time: 162 msec MV E velocity: 125.50 cm/s MV A velocity: 110.50 cm/s MV E/A ratio:  1.14 Yasmyn Bellisario Priya Kohler Pellerito Electronically signed by Diannah Late Sherial Ebrahim Signature Date/Time: 09/17/2024/4:05:02 PM    Final    DG CHEST PORT 1 VIEW Result Date: 09/17/2024 CLINICAL DATA:  [id number] Encounter for central line placement [id number]. EXAM: PORTABLE CHEST 1 VIEW COMPARISON:  09/15/2024. FINDINGS: Redemonstration of bilateral small pleural effusions, right more than left without significant interval change. Bilateral lungs are otherwise grossly clear. Stable cardio-mediastinal silhouette. No acute osseous abnormalities. The soft tissues are within normal limits. Interval placement of right IJ central venous catheter with its tip overlying the cavoatrial junction region. No pneumothorax. IMPRESSION: Interval placement of right IJ central venous catheter with its tip overlying the cavoatrial junction region. No pneumothorax. Electronically Signed   By: Ree Molt M.D.   On: 09/17/2024 15:20    Assessment & Plan   Acute on chronic systolic and diastolic heart failure - Presented with worsening DOE, orthopnea, PND, bilateral leg swelling x couple of weeks.   - Discharge weight from December 2025 was 98 kg and weight from current admission was also 98 kg.  He reported that he was feeling short of breath since discharge from the hospital in December 2025. Never improved. - proBNP  significantly elevated, 22,909 and chest x-ray showed pulmonary vascular congestion. - Tachypneic, has anasarca. Grossly volume overloaded on examination.  Chloride 107, bicarbonate 13.  Serum creatinine 5.46.  soft blood pressures. - Initially on IV Lasix  40 mg twice daily, switched to Lasix  drip 20 mg/h.  Due to oliguria, he is getting hemodialysis through central venous catheter.  He underwent dialysis yesterday, he will undergo again and again tomorrow as well. - Echocardiogram from December 2025 showed LVEF 30 to 35%, G1 DD, normal RV function, low-flow low gradient moderate to severe aortic valve stenosis, mild MR with mild MS.   Cardiomyopathy, unclear etiology - He was found to have new onset cardiomyopathy with LVEF 30 to 35% and a normal RV function in December 2025.  He also has aortic valve stenosis, low-flow-low gradient moderate to severe aortic valve stenosis. - Ischemia evaluation limited due to severe renal dysfunction.  Outpatient cardiac stress PET was obtained by his primary cardiologist, Dr.  Floretta but it is canceled due to inpatient admission.  If he becomes dialysis dependent, can consider outpatient ischemia evaluation after he clinically recovers from MSSA bacteremia.  MSSA bacteremia - On antibiotics per primary team.  ID on board.  Recommended TTE and TEE.  TTE did not reveal any vegetations.  He will benefit of TEE only when he is clinically stable.  Atrial fibrillation - There were concerns about atrial fibrillation in the past per cardiology notes from December 2025.  But later, he was determined to be in NSR.  Tele reviewed this admission, appeared to be in atrial fibrillation but had frequent PACs and PVCs.  Baseline has artifact.  EKG showed normal sinus rhythm with fascicular AV block yesterday.  Repeat EKG from today is pending.  He will need 2-week event monitor upon discharge.   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  He was seen by his primary cardiologist, Dr. Floretta in January 2026 who ordered aortic valve calcium  score but it was not quantified based on the recent imaging study/CT coronary calcium  scoring.  His coronary calcium  score is 3945, 90th percentile for age and sex matched control.  I am suspecting he probably will have elevated aortic valve calcium  score as well due to severe renal dysfunction.   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  Outpatient surveillance.  [facility] will sign off.   Medication Recommendations: Continue medications Other recommendations (labs, testing, etc): Please call us  back when patient is clinically stable to undergo TEE. Follow up as an outpatient: Keep appointment with outpatient cardiology  CRITICAL CARE Performed by: Diannah SQUIBB Diallo Ponder   Total critical care time: 40 minutes  Critical care time was exclusive of separately billable procedures and treating other patients.  Critical care was necessary to treat or prevent imminent or life-threatening deterioration.  Critical care was time spent personally by me on the following activities: development of treatment plan with patient and/or surrogate as well as nursing, discussions with consultants, evaluation of patient's response to treatment, examination of patient, obtaining history from patient or surrogate, ordering and performing treatments and interventions, ordering and review of laboratory studies, ordering and review of radiographic studies, pulse oximetry and re-evaluation of patient's condition.    Signed, Diannah SQUIBB [place], MD  09/18/2024, 11:26 AM    "

## 2024-09-19 LAB — RENAL FUNCTION PANEL
Albumin: 2.8 g/dL — ABNORMAL LOW (ref 3.5–5.0)
Anion gap: 15 (ref 5–15)
BUN: 81 mg/dL — ABNORMAL HIGH (ref 8–23)
CO2: 22 mmol/L (ref 22–32)
Calcium: 8.4 mg/dL — ABNORMAL LOW (ref 8.9–10.3)
Chloride: 100 mmol/L (ref 98–111)
Creatinine, Ser: 3.89 mg/dL — ABNORMAL HIGH (ref 0.61–1.24)
GFR, Estimated: 15 mL/min — ABNORMAL LOW
Glucose, Bld: 100 mg/dL — ABNORMAL HIGH (ref 70–99)
Phosphorus: 5.4 mg/dL — ABNORMAL HIGH (ref 2.5–4.6)
Potassium: 3.7 mmol/L (ref 3.5–5.1)
Sodium: 137 mmol/L (ref 135–145)

## 2024-09-19 LAB — GLUCOSE, CAPILLARY: Glucose-Capillary: 87 mg/dL (ref 70–99)

## 2024-09-19 LAB — CBC
HCT: 22.7 % — ABNORMAL LOW (ref 39.0–52.0)
Hemoglobin: 7.5 g/dL — ABNORMAL LOW (ref 13.0–17.0)
MCH: 30.7 pg (ref 26.0–34.0)
MCHC: 33 g/dL (ref 30.0–36.0)
MCV: 93 fL (ref 80.0–100.0)
Platelets: 100 10*3/uL — ABNORMAL LOW (ref 150–400)
RBC: 2.44 MIL/uL — ABNORMAL LOW (ref 4.22–5.81)
RDW: 15.9 % — ABNORMAL HIGH (ref 11.5–15.5)
WBC: 8 10*3/uL (ref 4.0–10.5)
nRBC: 0 % (ref 0.0–0.2)

## 2024-09-19 LAB — PARATHYROID HORMONE, INTACT (NO CA): PTH: 155 pg/mL — ABNORMAL HIGH (ref 15–65)

## 2024-09-19 MED ORDER — HEPARIN SODIUM (PORCINE) 1000 UNIT/ML DIALYSIS
1000.0000 [IU] | INTRAMUSCULAR | Status: DC | PRN
  Administered 2024-09-19: 1000 [IU]

## 2024-09-19 MED ORDER — LIDOCAINE HCL (PF) 1 % IJ SOLN: 5.0000 mL | INTRAMUSCULAR | Status: DC | PRN

## 2024-09-19 MED ORDER — PENTAFLUOROPROP-TETRAFLUOROETH EX AERO: 1.0000 | INHALATION_SPRAY | CUTANEOUS | Status: DC | PRN

## 2024-09-19 MED ORDER — HEPARIN SODIUM (PORCINE) 1000 UNIT/ML DIALYSIS: 1000.0000 [IU] | INTRAMUSCULAR | Status: DC | PRN

## 2024-09-19 MED ORDER — LIDOCAINE-PRILOCAINE 2.5-2.5 % EX CREA: 1.0000 | TOPICAL_CREAM | CUTANEOUS | Status: DC | PRN

## 2024-09-19 MED ORDER — ALTEPLASE 2 MG IJ SOLR: 2.0000 mg | Freq: Once | INTRAMUSCULAR | Status: DC | PRN

## 2024-09-19 MED ORDER — ANTICOAGULANT SODIUM CITRATE 4% (200MG/5ML) IV SOLN: 5.0000 mL | Status: DC | PRN

## 2024-09-19 NOTE — Progress Notes (Signed)
 [facility] NEPHROLOGY PROGRESS NOTE  Assessment/ Plan: Pt is a 84 y.o. yo male  with past medical history significant for hypertension, dyslipidemia, TIA, CHF with EF of 30 to 35%, CKD 5, anemia presented with dyspnea on exertion, generalized weakness and fatigue seen as a consultation for worsening renal function.   # Progressive CKD 5 with features of uremia, volume overload and acidosis, suboptimal response with IV diuretics.  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 -Started HD on 2/3 after discussion with the patient and his wife who agreed to proceed with dialysis.  Status post right IJ temporary HD catheter placed by surgeon, appreciated.  Plan for tunneled HD catheter after bacteremia clears.  ID is following.   - HD today #3 - will need CLIP   # MSSA bacteremia: Currently on Ancef  per ID team.  Repeat blood culture sent on 2/2.  Follow the result.   # Acute on chronic CHF: EF of 30 to 35% and has a grade 1 diastolic dysfunction.  Suboptimal response with diuretics, now UF with HD.  Cardiology is following.   # Metabolic acidosis: Managing with dialysis, DC oral sodium bicarbonate .   # Anemia of chronic disease, CKD: Received a unit of blood transfusion, iron saturation 26%, serum iron 43.  No IV iron because of bacteremia.  Start Aranesp .   # CKD-MBD, hyperphosphatemia: Pending PTH level, phosphorus level expect to improve with dialysis.   Subjective: Seen and examined.  Wife at bedside.  Tolerated HD yesterday with 1L UF. For HD #3 today.  Objective Vital signs in last 24 hours: Vitals:   09/18/24 2315 09/19/24 0200 09/19/24 0347 09/19/24 0500  BP: (!) 121/54  121/70   Pulse: 82  82   Resp: 18 19 17    Temp: 97.9 F (36.6 C)  97.7 F (36.5 C)   TempSrc: Oral  Axillary   SpO2: 100%  98%   Weight:    98 kg  Height:       Weight change: 2.2 kg  Intake/Output Summary (Last 24 hours) at 09/19/2024 1023 Last data filed at 09/18/2024 1335 Gross per 24 hour  Intake --   Output 1000 ml  Net -1000 ml       Labs: RENAL PANEL Recent Labs  Lab 09/15/24 2000 09/16/24 0147 09/17/24 0422 09/18/24 0426 09/19/24 0455  NA 141 142 138 138 137  K 4.5 4.3 4.3 3.9 3.7  CL 109 109 107 103 100  CO2 12* 12* 13* 17* 22  GLUCOSE 83 74 79 100* 100*  BUN 125* 121* 129* 98* 81*  CREATININE 4.97* 4.92* 5.46* 4.66* 3.89*  CALCIUM  8.8* 8.7* 8.4* 8.5* 8.4*  MG  --  2.6* 2.4  --   --   PHOS  --  7.6* 8.0* 6.1* 5.4*  ALBUMIN  --  3.2*  --  2.9* 2.8*    Liver Function Tests: Recent Labs  Lab 09/16/24 0147 09/18/24 0426 09/19/24 0455  AST 19  --   --   ALT 25  --   --   ALKPHOS 72  --   --   BILITOT <0.2  --   --   PROT 5.5*  --   --   ALBUMIN 3.2* 2.9* 2.8*   No results for input(s): LIPASE, AMYLASE in the last 168 hours. No results for input(s): AMMONIA in the last 168 hours. CBC: Recent Labs    07/31/24 0046 07/31/24 9488 07/31/24 2048 08/01/24 0232 09/15/24 2000 09/16/24 0800 09/17/24 0422 09/18/24 0426 09/19/24 0455  HGB  --    < >  --    < > 7.3* 7.7* 7.3* 7.5* 7.5*  MCV  --    < >  --    < > 95.9 95.1 94.5 92.1 93.0  FERRITIN  --   --  152  --   --   --   --  649*  --   TIBC 273  --   --   --   --   --   --  169*  --   IRON 43*  --   --   --   --   --   --  43*  --    < > = values in this interval not displayed.    Cardiac Enzymes: No results for input(s): CKTOTAL, CKMB, CKMBINDEX, TROPONINI in the last 168 hours. CBG: Recent Labs  Lab 09/16/24 1559  GLUCAP 96    Iron Studies:  Recent Labs    09/18/24 0426  IRON 43*  TIBC 169*  FERRITIN 649*   Studies/Results: ECHOCARDIOGRAM COMPLETE Result Date: 09/17/2024    ECHOCARDIOGRAM REPORT   Patient Name:   Cassiel Fernandez. Date of Exam: 09/17/2024 Medical Rec #:  [id number]          Height:       66.9 in Accession #:    [id number]         Weight:       218.7 lb Date of Birth:  [birth year]           BSA:          2.099 m Patient Age:    83 years           BP:            116/57 mmHg Patient Gender: M                  HR:           78 bpm. Exam Location:  [facility] Procedure: 2D Echo, 3D Echo, Cardiac Doppler, Color Doppler and Strain Analysis            (Both Spectral and Color Flow Doppler were utilized during            procedure). Indications:    Bacteremia R78.81                 Congestive Heart Failure I50.9  History:        Patient has prior history of Echocardiogram examinations, most                 recent 07/25/2024. Aortic Valve Disease and Mitral Valve                 Disease; Risk Factors:Hypertension, Diabetes, Dyslipidemia,                 Non-Smoker and Obesity.  Sonographer:    Aida Pizza RCS Referring Phys: [id number] [facility]-SULT[facility]  Sonographer Comments: Global longitudinal strain was attempted. IMPRESSIONS  1. Left ventricular ejection fraction, by estimation, is 35 to 40%. Left ventricular ejection fraction by 3D volume is 35 %. The left ventricle has moderately decreased function. The left ventricle demonstrates global hypokinesis. The left ventricular internal cavity size was moderately dilated. Left ventricular diastolic parameters are consistent with Grade II diastolic dysfunction (pseudonormalization). Elevated left atrial pressure. The average left ventricular global longitudinal strain is -8.4 %.  The global longitudinal strain is abnormal.  2. Right ventricular  systolic function is normal. The right ventricular size is normal. Tricuspid regurgitation signal is inadequate for assessing PA pressure.  3. Left atrial size was mild to moderately dilated.  4. The mitral valve is abnormal. Trivial mitral valve regurgitation. Mild mitral stenosis. The mean mitral valve gradient is 3.0 mmHg. Severe mitral annular calcification.  5. The aortic valve has an indeterminant number of cusps. Moderate to severe restriction in the leaflet mobility. There is moderate calcification of the aortic valve. There is moderate thickening of the aortic valve. Aortic valve  regurgitation is mild to moderate. 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 Aortic regurgitation PHT measures 433 msec. Aortic valve area, by VTI measures 1.26 cm. Aortic valve mean gradient measures 12.7 mmHg. Aortic valve Vmax measures 2.44 m/s.  6. Aortic dilatation noted. There is mild dilatation of the aortic root, measuring 40 mm.  7. The inferior vena cava is dilated in size with <50% respiratory variability, suggesting right atrial pressure of 15 mmHg. FINDINGS  Left Ventricle: Left ventricular ejection fraction, by estimation, is 35 to 40%. Left ventricular ejection fraction by 3D volume is 35 %. The left ventricle has moderately decreased function. The left ventricle demonstrates global hypokinesis. The average left ventricular global longitudinal strain is -8.4 %. Strain was performed and the global longitudinal strain is abnormal. The left ventricular internal cavity size was moderately dilated. There is no left ventricular hypertrophy. Left ventricular diastolic parameters are consistent with Grade II diastolic dysfunction (pseudonormalization). Elevated left atrial pressure. Right Ventricle: The right ventricular size is normal. No increase in right ventricular wall thickness. Right ventricular systolic function is normal. Tricuspid regurgitation signal is inadequate for assessing PA pressure. Left Atrium: Left atrial size was mild to moderately dilated. Right Atrium: Right atrial size was normal in size. Pericardium: There is no evidence of pericardial effusion. Mitral Valve: The mitral valve is abnormal. Severe mitral annular calcification. Trivial mitral valve regurgitation. Mild mitral valve stenosis. MV peak gradient, 7.8 mmHg. The mean mitral valve gradient is 3.0 mmHg. Tricuspid Valve: The tricuspid valve is normal in structure. Tricuspid valve regurgitation is not demonstrated. No evidence of tricuspid  stenosis. Aortic Valve: The aortic valve has an indeterminant number of cusps. There is moderate calcification of the aortic valve. There is moderate thickening of the aortic valve. Aortic valve regurgitation is mild to moderate. Aortic regurgitation PHT measures 433 msec. Moderate aortic stenosis is present. Aortic valve mean gradient measures 12.7 mmHg. Aortic valve peak gradient measures 23.9 mmHg. Aortic valve area, by VTI measures 1.26 cm. Pulmonic Valve: The pulmonic valve was not well visualized. Pulmonic valve regurgitation is not visualized. No evidence of pulmonic stenosis. Aorta: Aortic dilatation noted. There is mild dilatation of the aortic root, measuring 40 mm. Venous: The inferior vena cava is dilated in size with less than 50% respiratory variability, suggesting right atrial pressure of 15 mmHg. IAS/Shunts: No atrial level shunt detected by color flow Doppler. Additional Comments: 3D was performed not requiring image post processing on an independent workstation and was abnormal.  LEFT VENTRICLE PLAX 2D LVIDd:         6.00 cm         Diastology LVIDs:         4.70 cm         LV e' medial:    4.60 cm/s LV PW:         1.30 cm  LV E/e' medial:  27.3 LV IVS:        1.10 cm         LV e' lateral:   8.58 cm/s LVOT diam:     1.90 cm         LV E/e' lateral: 14.6 LV SV:         71 LV SV Index:   34              2D Longitudinal LVOT Area:     2.84 cm        Strain                                2D Strain GLS   -8.4 %                                Avg: LV Volumes (MOD) LV vol d, MOD    152.0 ml      3D Volume EF A4C:                           LV 3D EF:    Left LV vol s, MOD    95.4 ml                    ventricul A4C:                                        ar LV SV MOD A4C:   152.0 ml                   ejection                                             fraction                                             by 3D                                             volume is                                              35 %.                                 3D Volume EF:                                3D EF:        35 %  LV EDV:       206 ml                                LV ESV:       135 ml                                LV SV:        72 ml RIGHT VENTRICLE RV S prime:     13.40 cm/s TAPSE (M-mode): 2.0 cm LEFT ATRIUM             Index        RIGHT ATRIUM           Index LA diam:        4.10 cm 1.95 cm/m   RA Area:     18.40 cm LA Vol (A2C):   89.5 ml 42.64 ml/m  RA Volume:   49.50 ml  23.59 ml/m LA Vol (A4C):   87.9 ml 41.88 ml/m LA Biplane Vol: 96.1 ml 45.79 ml/m  AORTIC VALVE AV Area (Vmax):    1.24 cm AV Area (Vmean):   1.37 cm AV Area (VTI):     1.26 cm AV Vmax:           244.33 cm/s AV Vmean:          163.667 cm/s AV VTI:            0.569 m AV Peak Grad:      23.9 mmHg AV Mean Grad:      12.7 mmHg LVOT Vmax:         107.00 cm/s LVOT Vmean:        79.300 cm/s LVOT VTI:          0.252 m LVOT/AV VTI ratio: 0.44 AI PHT:            433 msec  AORTA Ao Root diam: 4.00 cm MITRAL VALVE MV Area (PHT): 4.70 cm     SHUNTS MV Area VTI:   2.30 cm     Systemic VTI:  0.25 m MV Peak grad:  7.8 mmHg     Systemic Diam: 1.90 cm MV Mean grad:  3.0 mmHg MV Vmax:       1.40 m/s MV Vmean:      73.8 cm/s MV Decel Time: 162 msec MV E velocity: 125.50 cm/s MV A velocity: 110.50 cm/s MV E/A ratio:  1.14 Vishnu Priya Mallipeddi Electronically signed by Diannah Late Mallipeddi Signature Date/Time: 09/17/2024/4:05:02 PM    Final    DG CHEST PORT 1 VIEW Result Date: 09/17/2024 CLINICAL DATA:  [id number] Encounter for central line placement [id number]. EXAM: PORTABLE CHEST 1 VIEW COMPARISON:  09/15/2024. FINDINGS: Redemonstration of bilateral small pleural effusions, right more than left without significant interval change. Bilateral lungs are otherwise grossly clear. Stable cardio-mediastinal silhouette. No acute osseous abnormalities. The soft tissues are within normal limits. Interval placement of right IJ  central venous catheter with its tip overlying the cavoatrial junction region. No pneumothorax. IMPRESSION: Interval placement of right IJ central venous catheter with its tip overlying the cavoatrial junction region. No pneumothorax. Electronically Signed   By: Ree Molt M.D.   On: 09/17/2024 15:20    Medications: Infusions:   ceFAZolin  (ANCEF ) IV 1 g (09/18/24 1926)    Scheduled Medications:  sodium chloride    Intravenous Once  atorvastatin   10 mg Oral Daily   Chlorhexidine  Gluconate Cloth  6 each Topical Q0600   Chlorhexidine  Gluconate Cloth  6 each Topical Q0600   darbepoetin (ARANESP ) injection - DIALYSIS  100 mcg Subcutaneous Q Wed-1800   doxazosin   1 mg Oral QHS   heparin   5,000 Units Subcutaneous Q8H    have reviewed scheduled and prn medications.  Physical Exam: General:NAD, sleeping Heart:RRR, s1s2 nl Lungs: Basal crackles. Abdomen:soft, Non-tender, non-distended Extremities: edema+, bandage applied in the lower extremities Dialysis Access: Right IJ temporary HD line, site clean  Aaro Meyers 09/19/2024,10:23 AM  LOS: 4 days

## 2024-09-19 NOTE — Progress Notes (Addendum)
 Met at bedside with pt in HD. Discussed his out-pt dialysis.   Informed him of his MWF 12:20 chair time acceptance at [facility].   Pt stated he wants to speak with his kidney doctor about this, navigator informed kidney dr already aware, but will pass along this request. Pt agreeable and had no questions.   Schedule letter left at bedside and instructed HD RN to please make sure it makes it to floor with him. Will continue to assist as needed.   Tammy Ericsson Dialysis Nav [phone]

## 2024-09-19 NOTE — Plan of Care (Signed)
" °  Problem: Acute Rehab OT Goals (only OT should resolve) Goal: Pt. Will Perform Grooming Flowsheets (Taken 09/19/2024 1456) Pt Will Perform Grooming:  with modified independence  standing Goal: Pt. Will Perform Upper Body Dressing Flowsheets (Taken 09/16/2024 1017) Pt Will Perform Upper Body Dressing: with modified independence Goal: Pt. Will Perform Lower Body Dressing Flowsheets (Taken 09/19/2024 1456) Pt Will Perform Lower Body Dressing:  with modified independence  with adaptive equipment  sitting/lateral leans Goal: Pt. Will Transfer To Toilet Flowsheets (Taken 09/19/2024 1456) Pt Will Transfer to Toilet:  with contact guard assist  stand pivot transfer  ambulating Goal: Pt. Will Perform Toileting-Clothing Manipulation Flowsheets (Taken 09/19/2024 1456) Pt Will Perform Toileting - Clothing Manipulation and hygiene:  with modified independence  sit to/from stand  sitting/lateral leans Goal: Pt/Caregiver Will Perform Home Exercise Program Flowsheets (Taken 09/19/2024 1456) Pt/caregiver will Perform Home Exercise Program:  Increased strength  Both right and left upper extremity  Independently  Dresden Lozito OT, MOT  "

## 2024-09-19 NOTE — Progress Notes (Signed)
 [facility] PROGRESS NOTE  Ricardo Mayer. (DOB: 09-Mar-1941) FMW:[id number] PCP: [name], DO Outpatient Specialists: Dr. Tobie, Nephrology  Brief Narrative: Ricardo JONETTA Etha Mickey. is an 84 y.o. 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 Tobie), HTN, HLD who presented to the ED on 09/15/2024 with progressive dyspnea on exertion, productive cough and swelling.   Initial evaluation revealed severe volume overload with proBNP 22,909, worsening renal function (Cr 5.46, BUN 129), high anion gap metabolic acidosis (CO2 12), anemia (Hgb 7.3), and bilateral pleural effusions with pulmonary vascular congestion on chest x-ray.  He was initially treated with IV diuretics but developed hypothermia (90.19F) and was found to have MSSA bacteremia, prompting initiation of cefazolin  2 g IV q8h and doxycycline  for concomitant community-acquired pneumonia.  Volume overload and acidosis have persisted despite high dose lasix  infusion and bicarbonate. After discussion amongst the ID, nephrology, cardiology, and medical teams, the plan is to insert trialysis catheter to initiate urgent HD, continue following surveillance blood cultures, and once MSSA confirmed to have cleared, remove temp line and insert TDC.   HD initiated 2/3, continuing serially 2/4, 2/5. Tolerated well.  Subjective: Feels better, getting appetite back, no dyspnea or other complaints. Spouse at bedside.  Objective: BP 130/70 (BP Location: Left Arm)   Pulse 60   Temp 97.8 F (36.6 C) (Oral)   Resp 18   Ht 5' 7 (1.702 m)   Wt 98 kg   SpO2 93%   BMI 33.84 kg/m   Gen: Obese male in no distress sitting in chair Pulm: Nonlabored, clear  CV: Somewhat irregular, no MRG, 2+ LE edema GI: Soft, NT, ND, +BS  Neuro: Alert and oriented. No new focal deficits. Ext: Warm, no deformities. Skin: Legs wrapped, c/d/i. No new rashes,  lesions or ulcers on visualized skin   TTE (09/17/2024): LVEF 35-40%, G2DD, moderate AS (possibly low flow), IVC dilated/blunted.  Assessment & Plan: MSSA bacteremia: Blood cultures (2/1) positive for MSSA in 1 of 2 collections. Currently afebrile, no leukocytosis, lactate within normal limits.  - Continue ancef . Duration likely 4-6 weeks. Appreciate ID consultation.  - TTE negative for vegetation, though AV abnormal (known from prior). TEE will be performed once more stable. - Follow up further testing from Cx 2/1 and repeat Cx's 2/2 (NGTD).   CAP: Suspected based on dyspnea, productive cough and CXR opacities. PCT 0.31, WBC normal (though neutrophilia noted w/PMNs 8.8k), afebrile now but transiently hypothermic shortly after admission requiring bair hugger. In this case, some diagnostic uncertainty, but clinical status is precarious enough to warrant early abx and eventual deescalation. Covid, flu, RSV PCR's negative.  - Covered by ancef , doxycycline .  - Sputum culture sent but not adequate specimen.  Acute on chronic combined HFrEF: LVEF 30-35%, G1DD, mod-severe AS.  - Remained volume overloaded but oliguric on lasix  gtt 20 mg/h, managing volume with HD as well now. HD planned 2/3, 2/4, 2/5 - Continue strict I/O, daily weights.  - Holding BB, hydral w/soft BPs.    Possible PAF:  - Defer telemetry review to cardiology, has hx of borderline ECG/tele but not currently known to have AFib.   ESRD (progressed CKD 5) with refractory volume overload, uremia, and metabolic acidosis (w/VBG showing compensation): Trial of medical management for acidosis, volume overload unsuccessful.  - s/p temporary dialysis catheter insertion 2/3 by Dr. Kallie, initiated HD that day, getting 3rd Tx today 2/5.  -  Once bacteremia cleared, would be candidate for [facility].  - Renal navigator/social worker consulted for outpatient dialysis arrangements  Anemia of CKD: Normocytic anemia w/hgb 7.3g/dl. s/p 1u RBCs 2/2, hgb  stable at 7.5g/dl. No bleeding. 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  - Keep T&S UTD, as pt/family consent to transfusions as indicated. - ESA per nephrology. Ferritin elevated, has bacteremia, not giving IV iron.   HTN: Holding antihypertensives for now as above, monitor BP. BP normotensive mostly.   HLD:  - Continue statin  Deconditioning:  - Will get PT/OT involved > Will pursue SNF once discharged. - Fall precautions   Bernardino KATHEE Come, MD [facility] [email or web address] 09/19/2024, 4:45 PM

## 2024-09-19 NOTE — Progress Notes (Signed)
 Occupational Therapy Evaluation Patient Details Name: Ricardo Mayer. MRN: [id number] DOB: Feb 14, 1941 Today's Date: 09/19/2024   History of Present Illness   Ricardo Strycharz. is a 84 y.o. 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  Also endorses a productive cough of more than a week duration with generalized weakness and fatigue.     Clinical Impressions Pt agreeable to OT and PT co-evaluation. Pt had previously been evaluated but was discharged due to being moved to a higher level of care. A new order was placed and thus a new evaluation. Today the pt required mod to max A for bed mobility and mod A for EOB to chair transfer with RW. B UE generally weak with pt demonstrating need for mod to max A for lower body ADL's. Pt left in the chair with call bell within reach and family present. Pt will benefit from continued OT in the hospital to increase strength, balance, and endurance for safe ADL's.        If plan is discharge home, recommend the following:   A lot of help with walking and/or transfers;A lot of help with bathing/dressing/bathroom;Assistance with cooking/housework;Assist for transportation;Help with stairs or ramp for entrance             Equipment Recommendations   None recommended by OT     Recommendations for Other Services         Precautions/Restrictions   Precautions Precautions: Fall Recall of Precautions/Restrictions: Intact Restrictions Weight Bearing Restrictions Per Provider Order: No     Mobility Bed Mobility Overal bed mobility: Needs Assistance Bed Mobility: Supine to Sit     Supine to sit: Mod assist, Max assist, HOB  elevated     General bed mobility comments: HOB elevated    Transfers Overall transfer level: Needs assistance Equipment used: Rolling walker (2 wheels) Transfers: Sit to/from Stand, Bed to chair/wheelchair/BSC Sit to Stand: Mod assist     Step pivot transfers: Mod assist     General transfer comment: Labored; unsteady      Balance Overall balance assessment: Needs assistance Sitting-balance support: Feet supported, No upper extremity supported Sitting balance-Leahy Scale: Fair Sitting balance - Comments: seated at EOB   Standing balance support: Reliant on assistive device for balance, During functional activity, Bilateral upper extremity supported Standing balance-Leahy Scale: Poor Standing balance comment: using RW                           ADL either performed or assessed with clinical judgement   ADL Overall ADL's : Needs assistance/impaired     Grooming: Set up;Sitting   Upper Body Bathing: Sitting;Set up   Lower Body Bathing: Maximal assistance;Sitting/lateral leans;Moderate assistance   Upper Body Dressing : Set up;Sitting   Lower Body Dressing: Maximal assistance;Sitting/lateral leans   Toilet Transfer: Moderate assistance;Rolling walker (2 wheels);Stand-pivot;Moderate assistance Toilet Transfer Details (indicate cue type and reason): EOB to chair and back with RW. Toileting- Clothing Manipulation and Hygiene: Moderate assistance;Sitting/lateral lean               Vision Baseline Vision/History: 1 Wears glasses Ability to See in Adequate Light: 0 Adequate Patient Visual Report: No change from baseline  Vision Assessment?: No apparent visual deficits;Wears glasses for reading     Perception Perception: Not tested       Praxis Praxis: Not tested       Pertinent Vitals/Pain Pain Assessment Pain Assessment: No/denies pain     Extremity/Trunk Assessment Upper Extremity Assessment Upper Extremity Assessment: Generalized  weakness   Lower Extremity Assessment Lower Extremity Assessment: Defer to PT evaluation   Cervical / Trunk Assessment Cervical / Trunk Assessment: Kyphotic   Communication Communication Factors Affecting Communication: Hearing impaired   Cognition Arousal: Alert Behavior During Therapy: WFL for tasks assessed/performed Cognition: No apparent impairments                                           OT Goals(Current goals can be found in the care plan section)   Acute Rehab OT Goals Patient Stated Goal: Improve function. OT Goal Formulation: With patient/family Time For Goal Achievement: 10/03/24 Potential to Achieve Goals: Good   OT Frequency:  Min 2X/week    Co-evaluation PT/OT/SLP Co-Evaluation/Treatment: Yes Reason for Co-Treatment: To address functional/ADL transfers PT goals addressed during session: Mobility/safety with mobility;Balance;Proper use of DME OT goals addressed during session: ADL's and self-care                       End of Session Equipment Utilized During Treatment: Rolling walker (2 wheels)  Activity Tolerance: Patient tolerated treatment well Patient left: in chair;with call bell/phone within reach;with family/visitor present  OT Visit Diagnosis: Unsteadiness on feet (R26.81);Other abnormalities of gait and mobility (R26.89);Muscle weakness (generalized) (M62.81);History of falling (Z91.81)                Time: 1004-1020 OT Time Calculation (min): 16 min Charges:  OT General Charges $OT Visit: 1 Visit OT Evaluation $OT Eval Low Complexity: 1 Low  Wiley Flicker OT, MOT  Jayson Person 09/19/2024, 2:53 PM

## 2024-09-19 NOTE — Progress Notes (Signed)
 The patient has completed dialysis treatment without issue.  09/19/24 1553  Vitals  Temp 97.8 F (36.6 C)  Temp Source Oral  BP 130/70  BP Location Left Arm  BP Method Automatic  Patient Position (if appropriate) Lying  Resp 18  Oxygen Therapy  O2 Device Room Air  During Treatment Monitoring  Intra-Hemodialysis Comments Tx completed  Post Treatment  Dialyzer Clearance Lightly streaked  Hemodialysis Intake (mL) 0 mL  Liters Processed 60  Fluid Removed (mL) 1000 mL  Tolerated HD Treatment Yes  Post-Hemodialysis Comments see notes.

## 2024-09-19 NOTE — Progress Notes (Signed)
 Physical Therapy Treatment Patient Details Name: Ricardo Mayer. MRN: [id number] DOB: 09/08/1940 Today's Date: 09/19/2024   History of Present Illness Gerrad Welker. is a 84 y.o. 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  Also endorses a productive cough of more than a week duration with generalized weakness and fatigue.    PT Comments  REASSESSMENT: Patient agreeable for therapy. Patient demonstrates slow labored movement for sitting up at bedside, once seated able to keep trunk in midline after verbal tactile cueing, very unsteady on feet and limited to a few side steps before having to sit due to BLE weakness and c/o fatigue. Patient tolerated sitting up in chair after therapy with his spouse present. Patient will benefit from continued skilled physical therapy in hospital and recommended venue below to increase strength, balance, endurance for safe ADLs and gait.      If plan is discharge home, recommend the following: A lot of help with walking and/or transfers;A little help with walking and/or transfers;A lot of help with bathing/dressing/bathroom;Help with stairs or ramp for entrance;Assistance with cooking/housework   Can travel by private vehicle     No  Equipment Recommendations  None recommended by PT    Recommendations for Other Services       Precautions / Restrictions Precautions Precautions: Fall Recall of Precautions/Restrictions: Intact Restrictions Weight Bearing Restrictions Per Provider Order: No     Mobility  Bed Mobility Overal bed mobility: Needs Assistance Bed Mobility: Supine to Sit     Supine to sit: Mod assist, Max assist, HOB elevated      General bed mobility comments: slow labored movement with HOB raised    Transfers Overall transfer level: Needs assistance Equipment used: Rolling walker (2 wheels) Transfers: Sit to/from Stand, Bed to chair/wheelchair/BSC Sit to Stand: Mod assist   Step pivot transfers: Mod assist       General transfer comment: slow labored movement with difficulty completing sit to stands due to BLE weakness    Ambulation/Gait Ambulation/Gait assistance: Mod assist, Max assist Gait Distance (Feet): 5 Feet Assistive device: Rolling walker (2 wheels) Gait Pattern/deviations: Decreased step length - right, Decreased step length - left, Decreased stride length, Decreased stance time - right, Decreased stance time - left, Knees buckling, Trunk flexed Gait velocity: slow     General Gait Details: limited to a few slow labored unsteady side steps before having to sit due to fatigue, BLE weakness   Stairs             Wheelchair Mobility     Tilt Bed    Modified Rankin (Stroke Patients Only)       Balance Overall balance assessment: Needs assistance Sitting-balance support: Feet supported, No upper extremity supported Sitting balance-Leahy Scale: Fair Sitting balance - Comments: seated at EOB   Standing balance support: Reliant on assistive device for balance, During functional activity, Bilateral upper extremity supported Standing balance-Leahy Scale: Poor Standing balance comment: using RW                            Communication Communication Factors Affecting Communication: Hearing impaired  Cognition Arousal: Alert Behavior During Therapy: [facility] for tasks assessed/performed  Following commands: Intact      Cueing Cueing Techniques: Verbal cues, Tactile cues  Exercises      General Comments        Pertinent Vitals/Pain Pain Assessment Pain Assessment: No/denies pain    Home Living                           Prior Function            PT Goals (current goals can now be found in the care plan section) Acute Rehab PT Goals Patient Stated Goal: return home after rehab PT Goal Formulation: With patient/family Time For Goal Achievement: 09/30/24 Potential to Achieve Goals: Good Progress towards PT goals: Progressing toward goals    Frequency    Min 3X/week      PT Plan      Co-evaluation PT/OT/SLP Co-Evaluation/Treatment: Yes Reason for Co-Treatment: To address functional/ADL transfers PT goals addressed during session: Mobility/safety with mobility;Balance;Proper use of DME        AM-PAC PT 6 Clicks Mobility   Outcome Measure  Help needed turning from your back to your side while in a flat bed without using bedrails?: A Lot Help needed moving from lying on your back to sitting on the side of a flat bed without using bedrails?: A Lot Help needed moving to and from a bed to a chair (including a wheelchair)?: A Lot Help needed standing up from a chair using your arms (e.g., wheelchair or bedside chair)?: A Lot Help needed to walk in hospital room?: A Lot Help needed climbing 3-5 steps with a railing? : Total 6 Click Score: 11    End of Session Equipment Utilized During Treatment: Gait belt Activity Tolerance: Patient tolerated treatment well;Patient limited by fatigue Patient left: in chair;with call bell/phone within reach;with family/visitor present Nurse Communication: Mobility status PT Visit Diagnosis: Unsteadiness on feet (R26.81);Other abnormalities of gait and mobility (R26.89);Muscle weakness (generalized) (M62.81)     Time: 8995-8978 PT Time Calculation (min) (ACUTE ONLY): 17 min  Charges:    $Therapeutic Activity: 8-22 mins PT General Charges $$ ACUTE PT VISIT: 1 Visit                     2:13 PM, 09/19/24 Lynwood Music, MPT Physical Therapist with [facility] [phone] office [phone] mobile phone

## 2024-09-19 NOTE — Progress Notes (Signed)
 Heart Failure Navigator Progress Note  Assessed for Heart & Vascular TOC clinic readiness.  Patient does not meet criteria due to ESRD on Hemodialysis.   Navigator will sign off at this time.  Roxy Horseman, RN, BSN [facility] Heart Failure Navigator Secure Chat Only

## 2024-09-19 NOTE — Plan of Care (Signed)
  Problem: Clinical Measurements: Goal: Ability to maintain clinical measurements within normal limits will improve Outcome: Progressing Goal: Will remain free from infection Outcome: Progressing Goal: Diagnostic test results will improve Outcome: Progressing Goal: Respiratory complications will improve Outcome: Progressing Goal: Cardiovascular complication will be avoided Outcome: Progressing   Problem: Coping: Goal: Level of anxiety will decrease Outcome: Progressing   Problem: Nutrition: Goal: Adequate nutrition will be maintained Outcome: Progressing

## 2024-09-20 LAB — RENAL FUNCTION PANEL
Albumin: 2.8 g/dL — ABNORMAL LOW (ref 3.5–5.0)
Anion gap: 14 (ref 5–15)
BUN: 51 mg/dL — ABNORMAL HIGH (ref 8–23)
CO2: 25 mmol/L (ref 22–32)
Calcium: 8.4 mg/dL — ABNORMAL LOW (ref 8.9–10.3)
Chloride: 98 mmol/L (ref 98–111)
Creatinine, Ser: 3.05 mg/dL — ABNORMAL HIGH (ref 0.61–1.24)
GFR, Estimated: 20 mL/min — ABNORMAL LOW
Glucose, Bld: 79 mg/dL (ref 70–99)
Phosphorus: 4.9 mg/dL — ABNORMAL HIGH (ref 2.5–4.6)
Potassium: 3.9 mmol/L (ref 3.5–5.1)
Sodium: 137 mmol/L (ref 135–145)

## 2024-09-20 LAB — TYPE AND SCREEN
ABO/RH(D): A NEG
Antibody Screen: NEGATIVE

## 2024-09-20 LAB — CULTURE, BLOOD (ROUTINE X 2)
Culture: NO GROWTH
Culture: NO GROWTH
Culture: NO GROWTH
Special Requests: ADEQUATE
Special Requests: ADEQUATE
Special Requests: ADEQUATE

## 2024-09-20 LAB — CBC
HCT: 23.1 % — ABNORMAL LOW (ref 39.0–52.0)
Hemoglobin: 7.5 g/dL — ABNORMAL LOW (ref 13.0–17.0)
MCH: 30.6 pg (ref 26.0–34.0)
MCHC: 32.5 g/dL (ref 30.0–36.0)
MCV: 94.3 fL (ref 80.0–100.0)
Platelets: 108 10*3/uL — ABNORMAL LOW (ref 150–400)
RBC: 2.45 MIL/uL — ABNORMAL LOW (ref 4.22–5.81)
RDW: 15.8 % — ABNORMAL HIGH (ref 11.5–15.5)
WBC: 6.1 10*3/uL (ref 4.0–10.5)
nRBC: 0 % (ref 0.0–0.2)

## 2024-09-20 MED ORDER — CEFAZOLIN IV (FOR PTA / DISCHARGE USE ONLY): 2.0000 g | INTRAVENOUS | Status: AC

## 2024-09-20 NOTE — Progress Notes (Signed)
" ° ° ° ° °  Patient with ESRD now on HD admitted with volume overload but became hypothermic and found to have MSSA bacteremia  TTE is negative  Cultures PRE TEMP HD catheter are NG at 4 days  I think if these cultures remain negative no need for true line holiday anc dan place central line when Nephrology wishes to proceed  Rather than get a TEE would give him 6 weeks of IV ancef  with HD thru 10/28/24   [name]. has an appointment on 10/22/24 at 1045AM with Dr. Fleeta Rothman  at  [facility], which  is located in the [facility] at  [address] in [place].  [address], which is located to the left of the elevators.  Phone: [phone]  Fax: [phone]  [email or web address]  The patient should arrive 30 minutes prior to their appoitment.  I will followup his culture data but then I will sign off   Please call with further questions.   Jomarie Fleeta Rothman 09/20/2024, 1:37 PM  "

## 2024-09-20 NOTE — Plan of Care (Signed)

## 2024-09-20 NOTE — Care Management Important Message (Signed)
 Important Message  Patient Details  Name: Ricardo Mayer. MRN: [id number] Date of Birth: 03/13/1941   Important Message Given:  Yes - Medicare IM     Serrita Lueth L Meha Vidrine 09/20/2024, 12:05 PM

## 2024-09-20 NOTE — Progress Notes (Signed)
 PHARMACY CONSULT NOTE FOR:  OUTPATIENT  PARENTERAL ANTIBIOTIC THERAPY with Dialysis  Indication: MSSA Bacteremia Regimen: Cefazolin  2 gm with HD MWF  End date: 10/28/24  No formal OPAT will be done as patient will receive antibiotics with HD. Renal coordinator aware. Will place informational order for discharge.   Thank you for allowing pharmacy to be a part of this patient's care.  Damien Quiet, PharmD, BCIDP Infectious Diseases Clinical Pharmacist Phone: [phone] 09/20/2024, 8:33 AM

## 2024-09-20 NOTE — Progress Notes (Addendum)
 [facility]  OR Monday for tunneled dialysis line. Hospitalist and ID are ok with me wiring the tunneled line over the now placed right internal jugular line as this is the safest for the patient and he has had negative cultures per report.  Likely around 8AM Monday. NPO midnight Can discuss with patient on Monday. Orders in place, someone will need to release them on Sunday. Dr. Mavis made aware.   Manuelita Pander, MD [facility] [address] [place], [place] [address] [phone] (office)

## 2024-09-20 NOTE — Progress Notes (Signed)
 " Daily Progress Note   Patient Name: Ricardo Mayer.       Date: 09/20/2024 DOB: [birth year]  Age: 84 y.o. MRN#: [id number] Attending Physician: Pearlean Manus, MD Primary Care Physician: [name], DO Admit Date: 09/15/2024 Length of Stay: 5 days  Reason for Consultation/Follow-up: Establishing goals of care  Subjective:   CC: Goals of care discussion  Subjective: Chart reviewed including most recent note Dr. Gearline today, Dr. Bryn yesterday, and transition of care team.  Labs reviewed today and notable for BUN 51, creatinine 3.05, phosphorus 4.9, hemoglobin 7.5  I saw and examined patient today.  He was awake and alert and pleasant during conversation.  His wife was also present at the bedside.  I introduced myself as a member of the palliative care team.  His wife reports that she thought that they were moving forward with the plan for rehab and I affirmed that that is my understanding as well.  I shared with her that my goal with visit today was just to make sure that he did not have any questions and get his feelings on how things are going now that he is started on dialysis.  He reports that he feels that dialysis is adding both time and quality to his life.  He does not feel bad after dialysis sessions other than feeling a little bit weak.  His wife reports that he does have some vivid thoughts and mild hallucinations after dialysis.  For example, he expressed that he thought the clock on his wall was a cukoo clock.  He does not have any severe hallucinations or anything scary.  Reports he just feels as though he does not think quite clearly and sometimes gets a little confused.  He feels better the next day after getting some sleep.  We discussed his plan to continue with dialysis as an outpatient.  I shared with him that as long as he feels that his time and quality of life is extended by dialysis, it makes sense to continue with this.  We talked about the fact that there  may become a point in time in the future where he feels that this is not what he was getting anymore and that it is okay if he feels that he needs to discuss benefit of continuing dialysis long-term with his nephrologist at any point  in the future.  We discussed that there are many people on dialysis who utilize it for a period of time but later on decided that they are not getting the benefit they did when they started and no longer want to continue.  Encouraged him to continue to assess in conjunction with his nephrology team if he feels he is not getting the expected benefit he wanted when he agreed to start dialysis.    He expressed appreciation for visit and continued understanding about his options moving forward.  He remains invested in plan to continue with current care and outpatient dialysis.  Encouraged him and his wife to continue to monitor his nutrition, cognition and functional status as these will be indicators of how he is doing overall in light of his multiple comorbidities.  Review of Systems Denies complaints or concerns today. Objective:   Vital Signs:  BP (!) 126/52 (BP Location: Right Arm)   Pulse 66   Temp 98.5 F (36.9 C)   Resp 18   Ht 5' 7 (1.702 m)   Wt 95.7 kg   SpO2 96%   BMI 33.04  kg/m   Physical Exam: General: NAD, alert Eyes: conjunctiva clear, anicteric sclera HENT: normocephalic, atraumatic, moist mucous membranes Cardiovascular: RRR, trace edema in LE b/l Respiratory: no increased work of breathing noted, not in respiratory distress Abdomen: not distended Skin: no rashes or lesions on visible skin Neuro: A&Ox4, following commands easily Psych: appropriately answers all questions  I personally reviewed recent imaging.   Assessment & Plan:   Assessment: 84 year old male with history of chronic combined congestive heart failure, progressive stage V kidney disease now started on hemodialysis, hypertension, hyperlipidemia who is currently admitted  with renal failure requiring initiation of dialysis, MSSA bacteremia, CAP, and heart failure exacerbation.  Palliative follow-up for revisiting long-term goals now that he has started on hemodialysis.  Recommendations/Plan: # Complex medical decision making/goals of care:  - Met with patient to explore goals of care since initiation of dialysis.  Prior to this hospitalization, he had expressed that he would not want to be on dialysis as an outpatient.  Discussed with him today and he reports that he does feel that current dialysis is adding time and quality to his life.  He would like to continue with outpatient dialysis.  We discussed that if he does reach a point of feeling as though continue dialysis is not adding quality time to his life, this is something that can be revisited in conjunction with his nephrologist at any point moving forward.  He was appreciative of information provided.  -  Code Status: Limited: Do not attempt resuscitation (DNR) -DNR-LIMITED -Do Not Intubate/DNI   Prognosis: Unable to determine  # Psychosocial Support:  - Patient's wife  # Discharge Planning: Skilled facility  -  Discussed with: Patient, patient's wife  Thank you for allowing the palliative care team to participate in the care Tadd D Etha Raddle.SABRA Amaryllis Meissner, MD Palliative Care Provider PMT # [phone]  If patient remains symptomatic despite maximum doses, please call PMT at [phone] between 0700 and 1900. Outside of these hours, please call attending, as PMT does not have night coverage.   I personally spent a total of 45 minutes in the care of the patient today including preparing to see the patient, getting/reviewing separately obtained history, performing a medically appropriate exam/evaluation, counseling and educating, and documenting clinical information in the EHR.  "

## 2024-09-20 NOTE — Progress Notes (Signed)
 [facility] PROGRESS NOTE  Ricardo Mayer. (DOB: 1940/12/24) FMW:[id number] PCP: [name], DO Outpatient Specialists: Dr. Tobie, Nephrology  Brief Narrative: Ricardo JONETTA Etha Mickey. is an 84 y.o. 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 Tobie), HTN, HLD who presented to the ED on 09/15/2024 with progressive dyspnea on exertion, productive cough and swelling.   Initial evaluation revealed severe volume overload with proBNP 22,909, worsening renal function (Cr 5.46, BUN 129), high anion gap metabolic acidosis (CO2 12), anemia (Hgb 7.3), and bilateral pleural effusions with pulmonary vascular congestion on chest x-ray.  He was initially treated with IV diuretics but developed hypothermia (90.20F) and was found to have MSSA bacteremia, prompting initiation of cefazolin  2 g IV q8h and doxycycline  for concomitant community-acquired pneumonia.  Volume overload and acidosis have persisted despite high dose lasix  infusion and bicarbonate. After discussion amongst the ID, nephrology, cardiology, and medical teams, the plan is to insert trialysis catheter to initiate urgent HD, continue following surveillance blood cultures, and once MSSA confirmed to have cleared, remove temp line and insert TDC.   HD initiated 2/3, continuing serially 2/4, 2/5. Tolerated well.  Subjective: Feels better, getting appetite back, no dyspnea or other complaints. Spouse at bedside.  Objective: BP (!) 126/52 (BP Location: Right Arm)   Pulse 66   Temp 98.5 F (36.9 C)   Resp 18   Ht 5' 7 (1.702 m)   Wt 95.7 kg   SpO2 96%   BMI 33.04 kg/m   Gen: Obese male in no distress sitting in chair Pulm: Nonlabored, clear  CV: Somewhat irregular, no MRG, 2+ LE edema GI: Soft, NT, ND, +BS  Neuro: Alert and oriented. No new focal deficits. Ext: Warm, no deformities. Skin: Legs wrapped, c/d/i. No new rashes,  lesions or ulcers on visualized skin   TTE (09/17/2024): LVEF 35-40%, G2DD, moderate AS (possibly low flow), IVC dilated/blunted.  Assessment & Plan: 1)MSSA Bacteremia: Blood cultures (09/15/24) positive for MSSA in 1 of 2 collections. Currently afebrile, no leukocytosis, lactate within normal limits.  - ID consult appreciated - TTE negative for vegetation, though AV abnormal (known from prior). - Repeat blood cultures from 09/16/2024 NGTD -Per ID physician no need for TEE, no need for dialysis catheter line holiday -If repeat blood cultures from 09/16/2024 remains negative okay to go ahead and place PermCath on 09/23/2024 -- ID physician recommends discharge on 6 weeks of IV ancef  2 g with HD on MWF schedule thru 10/28/24   2)CAP: Suspected based on dyspnea, productive cough and CXR opacities. PCT 0.31, WBC normal (though neutrophilia noted w/PMNs 8.8k), afebrile now but transiently hypothermic shortly after admission requiring bair hugger. In this case, some diagnostic uncertainty, but clinical status is precarious enough to warrant early abx and eventual deescalation. Covid, flu, RSV PCR's negative.  - Covered by ancef , doxycycline .  - Sputum culture sent but not adequate specimen.  Acute on chronic combined HFrEF: LVEF 30-35%, G1DD, mod-severe AS.  - Remained volume overloaded but oliguric on lasix  gtt 20 mg/h, managing volume with HD as well now. HD planned 2/3, 2/4, 2/5 - Continue strict I/O, daily weights.  - Holding BB, hydral w/soft BPs.  -Continue to use hemodialysis to address volume status   Possible PAF:  - Defer telemetry review to cardiology, has hx of borderline ECG/tele but not currently known to have AFib.   ESRD (progressed CKD 5) with refractory  volume overload, uremia, and metabolic acidosis (w/VBG showing compensation): Trial of medical management for acidosis, volume overload unsuccessful.  - s/p temporary dialysis catheter insertion 09/17/24 by Dr. Kallie, initiated HD on  09/17/24, patient had back-to-back hemodialysis from 09/17/2024 through 09/19/2024 inclusive -Next HD planned for 09/21/2024 and then 09/23/2024 after PermCath placement -Plan is for PermCath placement on 09/23/2024 - Renal navigator/social worker consulted for outpatient dialysis arrangements  Anemia of CKD: Normocytic anemia w/hgb 7.3g/dl. s/p 1u RBCs 2/2, hgb stable at 7.5g/dl. No bleeding. 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  - Keep T&S UTD, as pt/family consent to transfusions as indicated. - ESA per nephrology. Ferritin elevated, has bacteremia, not giving IV iron.   HTN: Holding antihypertensives for now as above, monitor BP. BP normotensive mostly.   HLD:  - Continue statin  Deconditioning/generalized weakness -PT eval appreciated recommends SNF rehab - - Fall precautions  Social/Ethics--- plan of care discussed with patient's wife and 2 sons as well as patient at bedside - Patient is a DNR/DNI, otherwise no significant limitations to treatment at this time - Treat the treatable   Ricardo Carwin, MD [facility] [email or web address] 09/20/2024, 7:11 PM

## 2024-09-20 NOTE — TOC Transition Note (Addendum)
 Transition of Care [facility]) - Discharge Note   Patient Details  Name: Ricardo Mayer. MRN: [id number] Date of Birth: April 20, 1941  [facility] CM/SW Contact:  Sharlyne Stabs, RN Phone Number: 09/20/2024, 12:58 PM   Addendum :   Discharge cancelled. Patient will go to OR Monday for HD cath.   Clinical Narrative:   Patient discharging to [facility], Insurance approved 2/6 - 2/10, navi auth id [id number], plan auth id [id number]. HD navigator added HD chair time to AVS. Kirke with [facility] will set up [facility] for HD transport. CM updated Duwaine, son. RN calling report.     Final next level of care: Skilled Nursing Facility Barriers to Discharge: Barriers Resolved   Patient Goals and CMS Choice Patient states their goals for this hospitalization and ongoing recovery are:: agreeable to SNF CMS Medicare.gov Compare Post Acute Care list provided to:: Patient Choice offered to / list presented to : Patient  ownership interest in [facility].provided to:: Patient    Discharge Placement                Patient to be transferred to facility by: EMS Name of family member notified: Duwaine Patient and family notified of of transfer: 09/20/24  Discharge Plan and Services Additional resources added to the After Visit Summary for   In-house Referral: Clinical Social Work Discharge Planning Services: CM Consult Post Acute Care Choice: Home Health, Durable Medical Equipment                  Social Drivers of Health (SDOH) Interventions SDOH Screenings   Food Insecurity: No Food Insecurity (09/17/2024)  Housing: Low Risk (09/17/2024)  Transportation Needs: No Transportation Needs (09/17/2024)  Utilities: Not At Risk (09/17/2024)  Alcohol Screen: Low Risk (08/20/2023)  Depression (PHQ2-9): Medium Risk (09/10/2024)  Financial Resource Strain: Low Risk (08/20/2023)  Physical Activity: Inactive (09/10/2024)  Social Connections: Socially Integrated (09/17/2024)  Stress: No Stress Concern  Present (09/10/2024)  Tobacco Use: Low Risk (09/15/2024)  Health Literacy: Adequate Health Literacy (09/10/2024)     Readmission Risk Interventions    09/16/2024    2:27 PM 08/01/2024   11:37 AM  Readmission Risk Prevention Plan  Transportation Screening Complete Complete  PCP or Specialist Appt within 5-7 Days  Complete  Home Care Screening  Complete  Medication Review (RN CM)  Complete  HRI or Home Care Consult Complete   Social Work Consult for Recovery Care Planning/Counseling Complete   Palliative Care Screening Not Applicable   Medication Review Oceanographer) Complete

## 2024-09-20 NOTE — Progress Notes (Signed)
 Contacted by pharmacy. Pt will need Cefazolin  2 gm with HD MWF , End date: 10/28/24. Contacted clinic [facility] and informed of this, and also that it looks like pt may be here still on Monday, requested to push start date back to Wednesday. Will continue to follow and assist.   Kenya Shiraishi Dialysis Navigator [phone]

## 2024-09-20 NOTE — Progress Notes (Signed)
 [facility] NEPHROLOGY PROGRESS NOTE  Assessment/ Plan: Pt is a 84 y.o. yo male  with past medical history significant for hypertension, dyslipidemia, TIA, CHF with EF of 30 to 35%, CKD 5, anemia presented with dyspnea on exertion, generalized weakness and fatigue seen as a consultation for worsening renal function.   # Progressive CKD 5 with features of uremia, volume overload and acidosis, suboptimal response with IV diuretics.  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 -Started HD on 2/3 after discussion with the patient and his wife who agreed to proceed with dialysis.  Status post right IJ temporary HD catheter placed by surgeon, appreciated.  Plan for tunneled HD catheter after bacteremia clears.  ID is following.   - CLIp-D - will need TDC conversion when stable- ID following- blood cultures NGTD x 4 days, will need TEE - Next HD Saturday, then Monday to get on MWF schedule as he is CLIP'd there   # MSSA bacteremia: Currently on Ancef  per ID team.  Repeat blood culture sent on 2/2, NGTD, ID and cardiology following.     # Acute on chronic CHF: EF of 30 to 35% and has a grade 1 diastolic dysfunction.  Suboptimal response with diuretics, now UF with HD.  Cardiology is following.   # Metabolic acidosis: Managing with dialysis, DC oral sodium bicarbonate .   # Anemia of chronic disease, CKD: Received a unit of blood transfusion, iron saturation 26%, serum iron 43.  No IV iron because of bacteremia.  Start Aranesp .   # CKD-MBD, hyperphosphatemia: Pending PTH level, phosphorus level expect to improve with dialysis.   Subjective: Seen and examined.  No big issues today- more awake and alert, reading the paper  Objective Vital signs in last 24 hours: Vitals:   09/19/24 2100 09/19/24 2115 09/20/24 0500 09/20/24 0505  BP: (!) 110/45 112/69  135/70  Pulse: 82   83  Resp:      Temp: 98.4 F (36.9 C)   99 F (37.2 C)  TempSrc: Axillary   Oral  SpO2: 97%   97%  Weight:   95.7 kg    Height:       Weight change: -2.9 kg  Intake/Output Summary (Last 24 hours) at 09/20/2024 1105 Last data filed at 09/20/2024 0511 Gross per 24 hour  Intake --  Output 1750 ml  Net -1750 ml       Labs: RENAL PANEL Recent Labs  Lab 09/16/24 0147 09/17/24 0422 09/18/24 0426 09/19/24 0455 09/20/24 0436  NA 142 138 138 137 137  K 4.3 4.3 3.9 3.7 3.9  CL 109 107 103 100 98  CO2 12* 13* 17* 22 25  GLUCOSE 74 79 100* 100* 79  BUN 121* 129* 98* 81* 51*  CREATININE 4.92* 5.46* 4.66* 3.89* 3.05*  CALCIUM  8.7* 8.4* 8.5* 8.4* 8.4*  MG 2.6* 2.4  --   --   --   PHOS 7.6* 8.0* 6.1* 5.4* 4.9*  ALBUMIN 3.2*  --  2.9* 2.8* 2.8*    Liver Function Tests: Recent Labs  Lab 09/16/24 0147 09/18/24 0426 09/19/24 0455 09/20/24 0436  AST 19  --   --   --   ALT 25  --   --   --   ALKPHOS 72  --   --   --   BILITOT <0.2  --   --   --   PROT 5.5*  --   --   --   ALBUMIN 3.2* 2.9* 2.8* 2.8*   No results for  input(s): LIPASE, AMYLASE in the last 168 hours. No results for input(s): AMMONIA in the last 168 hours. CBC: Recent Labs    07/31/24 0046 07/31/24 0511 07/31/24 2048 08/01/24 0232 09/16/24 0800 09/17/24 0422 09/18/24 0426 09/19/24 0455 09/20/24 0436  HGB  --    < >  --    < > 7.7* 7.3* 7.5* 7.5* 7.5*  MCV  --    < >  --    < > 95.1 94.5 92.1 93.0 94.3  FERRITIN  --   --  152  --   --   --  649*  --   --   TIBC 273  --   --   --   --   --  169*  --   --   IRON 43*  --   --   --   --   --  43*  --   --    < > = values in this interval not displayed.    Cardiac Enzymes: No results for input(s): CKTOTAL, CKMB, CKMBINDEX, TROPONINI in the last 168 hours. CBG: Recent Labs  Lab 09/16/24 1559 09/19/24 2107  GLUCAP 96 87    Iron Studies:  Recent Labs    09/18/24 0426  IRON 43*  TIBC 169*  FERRITIN 649*   Studies/Results: No results found.   Medications: Infusions:   ceFAZolin  (ANCEF ) IV 1 g (09/19/24 1840)    Scheduled Medications:  sodium  chloride   Intravenous Once   atorvastatin   10 mg Oral Daily   Chlorhexidine  Gluconate Cloth  6 each Topical Q0600   Chlorhexidine  Gluconate Cloth  6 each Topical Q0600   darbepoetin (ARANESP ) injection - DIALYSIS  100 mcg Subcutaneous Q Wed-1800   doxazosin   1 mg Oral QHS   heparin   5,000 Units Subcutaneous Q8H    have reviewed scheduled and prn medications.  Physical Exam: General:NAD, sleeping Heart:RRR, s1s2 nl Lungs: Basal crackles. Abdomen:soft, Non-tender, non-distended Extremities: edema+, bandage applied in the lower extremities Dialysis Access: Right IJ temporary HD line, site clean  Ricardo Mayer 09/20/2024,11:05 AM  LOS: 5 days

## 2024-09-23 ENCOUNTER — Encounter (HOSPITAL_COMMUNITY)
Admission: EM | Disposition: A | Discharge status: Skilled Nursing Facility | Payer: Self-pay | Source: Home / Self Care | Attending: Family Medicine

## 2024-10-10 ENCOUNTER — Ambulatory Visit: Admitting: Cardiology

## 2024-10-16 ENCOUNTER — Ambulatory Visit: Payer: Self-pay | Admitting: Family Medicine

## 2024-10-22 ENCOUNTER — Ambulatory Visit: Payer: Self-pay | Admitting: Infectious Disease

## 2024-11-19 ENCOUNTER — Ambulatory Visit (HOSPITAL_COMMUNITY)

## 2025-09-11 ENCOUNTER — Ambulatory Visit
